# Patient Record
Sex: Male | Born: 1945 | Race: Black or African American | Hispanic: No | Smoking: Former smoker
Health system: Southern US, Community
[De-identification: ages and names within clinical notes are randomized; demographics above are authoritative.]

## PROBLEM LIST (undated history)

## (undated) DIAGNOSIS — I10 Essential (primary) hypertension: Secondary | ICD-10-CM

## (undated) DIAGNOSIS — Z5189 Encounter for other specified aftercare: Secondary | ICD-10-CM

## (undated) DIAGNOSIS — T6701XA Heatstroke and sunstroke, initial encounter: Secondary | ICD-10-CM

## (undated) DIAGNOSIS — N189 Chronic kidney disease, unspecified: Secondary | ICD-10-CM

## (undated) DIAGNOSIS — J439 Emphysema, unspecified: Secondary | ICD-10-CM

## (undated) DIAGNOSIS — M1990 Unspecified osteoarthritis, unspecified site: Secondary | ICD-10-CM

## (undated) DIAGNOSIS — C3491 Malignant neoplasm of unspecified part of right bronchus or lung: Secondary | ICD-10-CM

## (undated) DIAGNOSIS — E119 Type 2 diabetes mellitus without complications: Secondary | ICD-10-CM

## (undated) DIAGNOSIS — F4310 Post-traumatic stress disorder, unspecified: Secondary | ICD-10-CM

## (undated) DIAGNOSIS — F32A Depression, unspecified: Secondary | ICD-10-CM

## (undated) DIAGNOSIS — F329 Major depressive disorder, single episode, unspecified: Secondary | ICD-10-CM

## (undated) DIAGNOSIS — E785 Hyperlipidemia, unspecified: Secondary | ICD-10-CM

## (undated) DIAGNOSIS — K219 Gastro-esophageal reflux disease without esophagitis: Secondary | ICD-10-CM

## (undated) DIAGNOSIS — R519 Headache, unspecified: Secondary | ICD-10-CM

## (undated) DIAGNOSIS — M109 Gout, unspecified: Secondary | ICD-10-CM

## (undated) DIAGNOSIS — R51 Headache: Secondary | ICD-10-CM

## (undated) HISTORY — DX: Emphysema, unspecified: J43.9

## (undated) HISTORY — DX: Encounter for other specified aftercare: Z51.89

## (undated) HISTORY — DX: Major depressive disorder, single episode, unspecified: F32.9

## (undated) HISTORY — PX: OTHER SURGICAL HISTORY: SHX169

## (undated) HISTORY — DX: Gastro-esophageal reflux disease without esophagitis: K21.9

## (undated) HISTORY — DX: Gout, unspecified: M10.9

## (undated) HISTORY — DX: Malignant neoplasm of unspecified part of right bronchus or lung: C34.91

## (undated) HISTORY — DX: Depression, unspecified: F32.A

## (undated) HISTORY — DX: Type 2 diabetes mellitus without complications: E11.9

## (undated) HISTORY — DX: Unspecified osteoarthritis, unspecified site: M19.90

## (undated) HISTORY — DX: Post-traumatic stress disorder, unspecified: F43.10

## (undated) HISTORY — DX: Hyperlipidemia, unspecified: E78.5

## (undated) HISTORY — PX: CATARACT EXTRACTION: SUR2

## (undated) HISTORY — DX: Essential (primary) hypertension: I10

---

## 1964-08-21 DIAGNOSIS — T6701XA Heatstroke and sunstroke, initial encounter: Secondary | ICD-10-CM

## 1964-08-21 HISTORY — DX: Heatstroke and sunstroke, initial encounter: T67.01XA

## 1987-08-22 HISTORY — PX: OTHER SURGICAL HISTORY: SHX169

## 2009-03-28 LAB — HM COLONOSCOPY: HM COLON: NORMAL

## 2009-05-24 LAB — HM COLONOSCOPY

## 2012-06-07 LAB — HM DIABETES EYE EXAM

## 2012-07-31 DIAGNOSIS — Q828 Other specified congenital malformations of skin: Secondary | ICD-10-CM | POA: Diagnosis not present

## 2012-07-31 DIAGNOSIS — M2040 Other hammer toe(s) (acquired), unspecified foot: Secondary | ICD-10-CM | POA: Diagnosis not present

## 2013-02-13 DIAGNOSIS — H26499 Other secondary cataract, unspecified eye: Secondary | ICD-10-CM | POA: Diagnosis not present

## 2013-02-13 DIAGNOSIS — H11159 Pinguecula, unspecified eye: Secondary | ICD-10-CM | POA: Diagnosis not present

## 2013-02-13 DIAGNOSIS — H2510 Age-related nuclear cataract, unspecified eye: Secondary | ICD-10-CM | POA: Diagnosis not present

## 2013-02-13 DIAGNOSIS — H00029 Hordeolum internum unspecified eye, unspecified eyelid: Secondary | ICD-10-CM | POA: Diagnosis not present

## 2013-02-25 DIAGNOSIS — H26499 Other secondary cataract, unspecified eye: Secondary | ICD-10-CM | POA: Diagnosis not present

## 2013-02-25 DIAGNOSIS — H2510 Age-related nuclear cataract, unspecified eye: Secondary | ICD-10-CM | POA: Diagnosis not present

## 2013-02-25 DIAGNOSIS — H11159 Pinguecula, unspecified eye: Secondary | ICD-10-CM | POA: Diagnosis not present

## 2013-02-25 DIAGNOSIS — H00029 Hordeolum internum unspecified eye, unspecified eyelid: Secondary | ICD-10-CM | POA: Diagnosis not present

## 2013-03-11 DIAGNOSIS — H26499 Other secondary cataract, unspecified eye: Secondary | ICD-10-CM | POA: Diagnosis not present

## 2013-03-11 DIAGNOSIS — H00029 Hordeolum internum unspecified eye, unspecified eyelid: Secondary | ICD-10-CM | POA: Diagnosis not present

## 2013-03-11 DIAGNOSIS — H2510 Age-related nuclear cataract, unspecified eye: Secondary | ICD-10-CM | POA: Diagnosis not present

## 2013-07-16 DIAGNOSIS — H2510 Age-related nuclear cataract, unspecified eye: Secondary | ICD-10-CM | POA: Diagnosis not present

## 2013-08-20 ENCOUNTER — Encounter: Payer: Self-pay | Admitting: Podiatrist

## 2013-08-20 ENCOUNTER — Ambulatory Visit (INDEPENDENT_AMBULATORY_CARE_PROVIDER_SITE_OTHER): Payer: Medicare Other | Admitting: Podiatrist

## 2013-08-20 VITALS — BP 157/89 | HR 73 | Resp 16

## 2013-08-20 DIAGNOSIS — L84 Corns and callosities: Secondary | ICD-10-CM | POA: Diagnosis not present

## 2013-08-20 NOTE — Patient Instructions (Signed)
Diabetes and Foot Care Diabetes may cause you to have problems because of poor blood supply (circulation) to your feet and legs. This may cause the skin on your feet to become thinner, break easier, and heal more slowly. Your skin may become dry, and the skin may peel and crack. You may also have nerve damage in your legs and feet causing decreased feeling in them. You may not notice minor injuries to your feet that could lead to infections or more serious problems. Taking care of your feet is one of the most important things you can do for yourself.  HOME CARE INSTRUCTIONS  Wear shoes at all times, even in the house. Do not go barefoot. Bare feet are easily injured.  Check your feet daily for blisters, cuts, and redness. If you cannot see the bottom of your feet, use a mirror or ask someone for help.  Wash your feet with warm water (do not use hot water) and mild soap. Then pat your feet and the areas between your toes until they are completely dry. Do not soak your feet as this can dry your skin.  Apply a moisturizing lotion or petroleum jelly (that does not contain alcohol and is unscented) to the skin on your feet and to dry, brittle toenails. Do not apply lotion between your toes.  Trim your toenails straight across. Do not dig under them or around the cuticle. File the edges of your nails with an emery board or nail file.  Do not cut corns or calluses or try to remove them with medicine.  Wear clean socks or stockings every day. Make sure they are not too tight. Do not wear knee-high stockings since they may decrease blood flow to your legs.  Wear shoes that fit properly and have enough cushioning. To break in new shoes, wear them for just a few hours a day. This prevents you from injuring your feet. Always look in your shoes before you put them on to be sure there are no objects inside.  Do not cross your legs. This may decrease the blood flow to your feet.  If you find a minor scrape,  cut, or break in the skin on your feet, keep it and the skin around it clean and dry. These areas may be cleansed with mild soap and water. Do not cleanse the area with peroxide, alcohol, or iodine.  When you remove an adhesive bandage, be sure not to damage the skin around it.  If you have a wound, look at it several times a day to make sure it is healing.  Do not use heating pads or hot water bottles. They may burn your skin. If you have lost feeling in your feet or legs, you may not know it is happening until it is too late.  Make sure your health care provider performs a complete foot exam at least annually or more often if you have foot problems. Report any cuts, sores, or bruises to your health care provider immediately. SEEK MEDICAL CARE IF:   You have an injury that is not healing.  You have cuts or breaks in the skin.  You have an ingrown nail.  You notice redness on your legs or feet.  You feel burning or tingling in your legs or feet.  You have pain or cramps in your legs and feet.  Your legs or feet are numb.  Your feet always feel cold. SEEK IMMEDIATE MEDICAL CARE IF:   There is increasing redness,   swelling, or pain in or around a wound.  There is a red line that goes up your leg.  Pus is coming from a wound.  You develop a fever or as directed by your health care provider.  You notice a bad smell coming from an ulcer or wound. Document Released: 08/04/2000 Document Revised: 04/09/2013 Document Reviewed: 01/14/2013 Exit[employer] Patient Information 2014 [employer].  

## 2013-08-20 NOTE — Progress Notes (Signed)
   Subjective: Patient presents today for continued care of a corn on the distal tip of the left second toe. He states he wore some nice dressy shoes to a funeral and developed the lesion. He was last seen by Dr. Joylene Igo and she debrided the lesion which resolved his issue. He would like a similar procedure performed at today's visit.  Objective: Chart is reviewed and neurovascular status is unchanged. Elongated contracture of left second toe is noted. Distal tip of the left second toe has a well nucleated porokeratotic lesion. No redness, no swelling, no sign of infection present.  Assessment: Callus, porokeratotic lesion distal tip left second toe  Plan: Excise the soft tissue lesion with a #15 blade without complication. Recommended padding and dispensed a silicone toe condom for his use in the future. He'll be seen back as needed for followup.

## 2013-08-28 ENCOUNTER — Ambulatory Visit: Payer: Self-pay | Admitting: Podiatry

## 2013-12-08 DIAGNOSIS — H2589 Other age-related cataract: Secondary | ICD-10-CM | POA: Diagnosis not present

## 2013-12-22 DIAGNOSIS — H2510 Age-related nuclear cataract, unspecified eye: Secondary | ICD-10-CM | POA: Diagnosis not present

## 2013-12-22 DIAGNOSIS — H2589 Other age-related cataract: Secondary | ICD-10-CM | POA: Diagnosis not present

## 2013-12-22 DIAGNOSIS — H269 Unspecified cataract: Secondary | ICD-10-CM | POA: Diagnosis not present

## 2014-01-26 DIAGNOSIS — H20019 Primary iridocyclitis, unspecified eye: Secondary | ICD-10-CM | POA: Diagnosis not present

## 2014-01-26 DIAGNOSIS — H26499 Other secondary cataract, unspecified eye: Secondary | ICD-10-CM | POA: Diagnosis not present

## 2014-01-26 DIAGNOSIS — H43399 Other vitreous opacities, unspecified eye: Secondary | ICD-10-CM | POA: Diagnosis not present

## 2014-03-26 ENCOUNTER — Telehealth: Payer: Self-pay

## 2014-03-26 ENCOUNTER — Ambulatory Visit (INDEPENDENT_AMBULATORY_CARE_PROVIDER_SITE_OTHER): Payer: Medicare Other | Admitting: Family Medicine

## 2014-03-26 ENCOUNTER — Encounter: Payer: Self-pay | Admitting: Family Medicine

## 2014-03-26 ENCOUNTER — Ambulatory Visit: Payer: Medicare Other

## 2014-03-26 VITALS — BP 158/88 | HR 80 | Temp 98.0°F | Wt 172.0 lb

## 2014-03-26 DIAGNOSIS — F3289 Other specified depressive episodes: Secondary | ICD-10-CM

## 2014-03-26 DIAGNOSIS — F17200 Nicotine dependence, unspecified, uncomplicated: Secondary | ICD-10-CM

## 2014-03-26 DIAGNOSIS — M1A09X0 Idiopathic chronic gout, multiple sites, without tophus (tophi): Secondary | ICD-10-CM

## 2014-03-26 DIAGNOSIS — Z720 Tobacco use: Secondary | ICD-10-CM

## 2014-03-26 DIAGNOSIS — M1A00X0 Idiopathic chronic gout, unspecified site, without tophus (tophi): Secondary | ICD-10-CM | POA: Diagnosis not present

## 2014-03-26 DIAGNOSIS — E1159 Type 2 diabetes mellitus with other circulatory complications: Secondary | ICD-10-CM | POA: Insufficient documentation

## 2014-03-26 DIAGNOSIS — E785 Hyperlipidemia, unspecified: Secondary | ICD-10-CM | POA: Insufficient documentation

## 2014-03-26 DIAGNOSIS — K219 Gastro-esophageal reflux disease without esophagitis: Secondary | ICD-10-CM | POA: Insufficient documentation

## 2014-03-26 DIAGNOSIS — F329 Major depressive disorder, single episode, unspecified: Secondary | ICD-10-CM

## 2014-03-26 DIAGNOSIS — Z87891 Personal history of nicotine dependence: Secondary | ICD-10-CM | POA: Insufficient documentation

## 2014-03-26 DIAGNOSIS — I10 Essential (primary) hypertension: Secondary | ICD-10-CM | POA: Diagnosis not present

## 2014-03-26 DIAGNOSIS — E119 Type 2 diabetes mellitus without complications: Secondary | ICD-10-CM

## 2014-03-26 DIAGNOSIS — Z860101 Personal history of adenomatous and serrated colon polyps: Secondary | ICD-10-CM | POA: Insufficient documentation

## 2014-03-26 DIAGNOSIS — E1129 Type 2 diabetes mellitus with other diabetic kidney complication: Secondary | ICD-10-CM | POA: Insufficient documentation

## 2014-03-26 DIAGNOSIS — Z86010 Personal history of colonic polyps: Secondary | ICD-10-CM

## 2014-03-26 DIAGNOSIS — M1A9XX0 Chronic gout, unspecified, without tophus (tophi): Secondary | ICD-10-CM | POA: Diagnosis not present

## 2014-03-26 DIAGNOSIS — M1990 Unspecified osteoarthritis, unspecified site: Secondary | ICD-10-CM

## 2014-03-26 DIAGNOSIS — M129 Arthropathy, unspecified: Secondary | ICD-10-CM

## 2014-03-26 DIAGNOSIS — I152 Hypertension secondary to endocrine disorders: Secondary | ICD-10-CM | POA: Insufficient documentation

## 2014-03-26 DIAGNOSIS — F32A Depression, unspecified: Secondary | ICD-10-CM | POA: Insufficient documentation

## 2014-03-26 DIAGNOSIS — M109 Gout, unspecified: Secondary | ICD-10-CM | POA: Insufficient documentation

## 2014-03-26 LAB — CBC
HCT: 46.6 % (ref 39.0–52.0)
Hemoglobin: 15.4 g/dL (ref 13.0–17.0)
MCHC: 33.1 g/dL (ref 30.0–36.0)
MCV: 90.9 fl (ref 78.0–100.0)
Platelets: 204 10*3/uL (ref 150.0–400.0)
RBC: 5.13 Mil/uL (ref 4.22–5.81)
RDW: 13.4 % (ref 11.5–15.5)
WBC: 6.4 10*3/uL (ref 4.0–10.5)

## 2014-03-26 LAB — LIPID PANEL
CHOL/HDL RATIO: 6
Cholesterol: 184 mg/dL (ref 0–200)
HDL: 30.8 mg/dL — ABNORMAL LOW (ref >39.00)
LDL CALC: 143 mg/dL — AB (ref 0–99)
NONHDL: 153.2
Triglycerides: 49 mg/dL (ref 0.0–149.0)
VLDL: 9.8 mg/dL (ref 0.0–40.0)

## 2014-03-26 LAB — COMPREHENSIVE METABOLIC PANEL
ALBUMIN: 4 g/dL (ref 3.5–5.2)
ALK PHOS: 69 U/L (ref 39–117)
ALT: 18 U/L (ref 0–53)
AST: 18 U/L (ref 0–37)
BUN: 12 mg/dL (ref 6–23)
CO2: 28 mEq/L (ref 19–32)
Calcium: 8.9 mg/dL (ref 8.4–10.5)
Chloride: 103 mEq/L (ref 96–112)
Creatinine, Ser: 1.2 mg/dL (ref 0.4–1.5)
GFR: 74.11 mL/min (ref >60.00)
Glucose, Bld: 81 mg/dL (ref 70–99)
POTASSIUM: 3.7 meq/L (ref 3.5–5.1)
SODIUM: 138 meq/L (ref 135–145)
TOTAL PROTEIN: 7.1 g/dL (ref 6.0–8.3)
Total Bilirubin: 0.7 mg/dL (ref 0.2–1.2)

## 2014-03-26 LAB — URIC ACID: Uric Acid, Serum: 5.6 mg/dL (ref 4.0–7.8)

## 2014-03-26 LAB — HEMOGLOBIN A1C: Hgb A1c MFr Bld: 5.9 % (ref 4.6–6.5)

## 2014-03-26 MED ORDER — LISINOPRIL 40 MG PO TABS: 40.0000 mg | ORAL_TABLET | Freq: Every day | ORAL | Status: DC

## 2014-03-26 MED ORDER — ATORVASTATIN CALCIUM 80 MG PO TABS: 40.0000 mg | ORAL_TABLET | Freq: Every day | ORAL | Status: DC

## 2014-03-26 MED ORDER — OMEPRAZOLE 20 MG PO CPDR: 20.0000 mg | DELAYED_RELEASE_CAPSULE | Freq: Every day | ORAL | Status: DC

## 2014-03-26 MED ORDER — ALLOPURINOL 100 MG PO TABS: 100.0000 mg | ORAL_TABLET | Freq: Every day | ORAL | Status: DC

## 2014-03-26 NOTE — Assessment & Plan Note (Signed)
Poorly controlled today as did not take lisinopril. Advised patient to take medication daily. He will return in 2 weeks on day he has taken it. I will refill medication if Cr ok today.

## 2014-03-26 NOTE — Patient Instructions (Signed)
Labs today. Call or message with results.  If labs ok, will refill medications See me back in 2 weeks to recheck blood pressure on day you are taking the medicine.

## 2014-03-26 NOTE — Assessment & Plan Note (Signed)
Prn indomethacin. Does not need new rx today. Check uric acid.
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
## 2014-03-26 NOTE — Assessment & Plan Note (Signed)
Counseled on cessation, not ready to quit.

## 2014-03-26 NOTE — Addendum Note (Signed)
Addended by: [name] on: 03/26/2014 06:11 PM   Modules accepted: Orders

## 2014-03-26 NOTE — Assessment & Plan Note (Signed)
Check lipids today. Patient took himself off of atorvastatin due to risk for diabetes. Discussed that he already has diabetes and risks of being off medication far outweigh risks of being on it. He would like to know level of lipids first before starting and admits he may not start one at all.

## 2014-03-26 NOTE — Assessment & Plan Note (Signed)
Check a1c today. Reports being diet controlled.

## 2014-03-26 NOTE — Telephone Encounter (Signed)
error 

## 2014-04-09 ENCOUNTER — Ambulatory Visit (INDEPENDENT_AMBULATORY_CARE_PROVIDER_SITE_OTHER): Payer: Medicare Other | Admitting: Family Medicine

## 2014-04-09 ENCOUNTER — Encounter: Payer: Self-pay | Admitting: Family Medicine

## 2014-04-09 VITALS — BP 140/82 | HR 68 | Temp 98.1°F | Wt 173.0 lb

## 2014-04-09 DIAGNOSIS — N529 Male erectile dysfunction, unspecified: Secondary | ICD-10-CM | POA: Insufficient documentation

## 2014-04-09 DIAGNOSIS — E785 Hyperlipidemia, unspecified: Secondary | ICD-10-CM | POA: Diagnosis not present

## 2014-04-09 DIAGNOSIS — I10 Essential (primary) hypertension: Secondary | ICD-10-CM

## 2014-04-09 DIAGNOSIS — N521 Erectile dysfunction due to diseases classified elsewhere: Secondary | ICD-10-CM

## 2014-04-09 MED ORDER — LOVASTATIN 20 MG PO TABS: 20.0000 mg | ORAL_TABLET | Freq: Every day | ORAL | Status: DC

## 2014-04-09 NOTE — Assessment & Plan Note (Signed)
Patient willing to trial lovastatin one to 2 times a week. Advised regular use. Discussed benefits and risks.

## 2014-04-09 NOTE — Patient Instructions (Signed)
Blood Pressure  Looks good today. Continue medication daily.   Erectile Dysfunction  Try cialis samples. Use only 1/2 tab and that will give you 12 doses. Then we can refill if need be.   Cholesterol  Take lovastatin at least once a week, if you can take it twice a week, that would be great as well  Health Maintenance Due  Topic Date Due  . Colonoscopy - try to get records 03/04/1994  . Zostavax - try to get records 03/04/2004  . Influenza Vaccine - when available 03/21/2014

## 2014-04-09 NOTE — Progress Notes (Signed)
  [name], MD Phone: [phone]  Subjective:   Richard Navarro is a 68 y.o. year old very pleasant male patient who presents with the following:  Hypertension BP Readings from Last 3 Encounters:  04/09/14 140/82  03/26/14 158/88  08/20/13 157/89  Home BP monitoring-no Compliant with medications-yes without side effects, lisinopril ROS-Denies any CP, HA, SOB, blurry vision, LE edema.   Erectile Dysfunction Tried viagra and cialis in the past. Currently not using anything.  ROS-unable to obtain erections  Hyperlipidemia On statin: no, resistant but agreeable ROS- no chest pain or shortness of breath. No myalgias  Past Medical History- Patient Active Problem List   Diagnosis Date Noted  . Tobacco abuse 03/26/2014    Priority: High  . Diabetes mellitus without complication     Priority: High  . Erectile dysfunction 04/09/2014    Priority: Medium  . Hypertension     Priority: Medium  . Gout     Priority: Medium  . Depression     Priority: Medium  . Hyperlipidemia     Priority: Medium  . Arthritis 03/26/2014    Priority: Low  . Personal history of colonic polyps 03/26/2014    Priority: Low  . GERD (gastroesophageal reflux disease)     Priority: Low   Medications- reviewed and updated Current Outpatient Prescriptions  Medication Sig Dispense Refill  . allopurinol (ZYLOPRIM) 100 MG tablet Take 1 tablet (100 mg total) by mouth daily.  30 tablet  11  . aspirin 81 MG tablet Take 81 mg by mouth daily.      . indomethacin (INDOCIN) 25 MG capsule Take 25 mg by mouth 2 (two) times daily with a meal.      . lisinopril (PRINIVIL,ZESTRIL) 40 MG tablet Take 1 tablet (40 mg total) by mouth daily. Take 1/2 tablet per day  30 tablet  prn  . omeprazole (PRILOSEC) 20 MG capsule Take 1 capsule (20 mg total) by mouth daily.  30 capsule  prn  . lovastatin (MEVACOR) 20 MG tablet Take 1 tablet (20 mg total) by mouth at bedtime. At least once a week.  30 tablet  5   No current  facility-administered medications for this visit.    Objective: BP 140/82  Pulse 68  Temp(Src) 98.1 F (36.7 C)  Wt 173 lb (78.472 kg) Gen: NAD, resting comfortably CV: RRR no murmurs rubs or gallops Lungs: CTAB no crackles, wheeze, rhonchi Ext: no edema  Assessment/Plan:  Hypertension Well-controlled when actually taking medication. Encourage regular use of lisinopril.  Erectile dysfunction Trial Cialis through samples. Will give regular prescription if patient request  Hyperlipidemia Patient willing to trial lovastatin one to 2 times a week. Advised regular use. Discussed benefits and risks.   Encourage smoking cessation. the patient is not ready to quit  Meds ordered this encounter  Medications  . lovastatin (MEVACOR) 20 MG tablet    Sig: Take 1 tablet (20 mg total) by mouth at bedtime. At least once a week.    Dispense:  30 tablet    Refill:  5

## 2014-04-09 NOTE — Assessment & Plan Note (Signed)
Trial Cialis through samples. Will give regular prescription if patient request

## 2014-04-09 NOTE — Assessment & Plan Note (Signed)
Well-controlled when actually taking medication. Encourage regular use of lisinopril.

## 2014-04-13 ENCOUNTER — Telehealth: Payer: Self-pay

## 2014-04-13 NOTE — Telephone Encounter (Signed)
LM for pt to stop by and fill out record release for so we can get colonoscopy results.

## 2014-04-17 ENCOUNTER — Encounter: Payer: Self-pay | Admitting: Family Medicine

## 2014-04-17 DIAGNOSIS — Z125 Encounter for screening for malignant neoplasm of prostate: Secondary | ICD-10-CM | POA: Insufficient documentation

## 2014-04-17 DIAGNOSIS — N182 Chronic kidney disease, stage 2 (mild): Secondary | ICD-10-CM | POA: Insufficient documentation

## 2014-04-28 ENCOUNTER — Telehealth: Payer: Self-pay | Admitting: Family Medicine

## 2014-04-28 MED ORDER — OMEPRAZOLE 20 MG PO CPDR: 20.0000 mg | DELAYED_RELEASE_CAPSULE | Freq: Every day | ORAL | Status: DC

## 2014-04-28 MED ORDER — ALLOPURINOL 100 MG PO TABS: 100.0000 mg | ORAL_TABLET | Freq: Every day | ORAL | Status: DC

## 2014-04-28 MED ORDER — LOVASTATIN 20 MG PO TABS: 20.0000 mg | ORAL_TABLET | Freq: Every day | ORAL | Status: DC

## 2014-04-28 MED ORDER — LISINOPRIL 40 MG PO TABS: 40.0000 mg | ORAL_TABLET | Freq: Every day | ORAL | Status: DC

## 2014-04-28 NOTE — Telephone Encounter (Signed)
Pt needs written rxs for omeprazole 20 mg,linsinopril 40  Mg,allopurinol 100 mg and lovastatin 20 mg #90 each with refills. Pt wife will pick up rxs

## 2014-04-28 NOTE — Telephone Encounter (Signed)
Medications printed and ready for pt

## 2014-05-22 ENCOUNTER — Telehealth: Payer: Self-pay | Admitting: Family Medicine

## 2014-05-22 MED ORDER — LOVASTATIN 20 MG PO TABS: 20.0000 mg | ORAL_TABLET | Freq: Every day | ORAL | Status: DC

## 2014-05-22 MED ORDER — LISINOPRIL 40 MG PO TABS: 40.0000 mg | ORAL_TABLET | Freq: Every day | ORAL | Status: DC

## 2014-05-22 NOTE — Telephone Encounter (Signed)
Left detailed message Rx's sent to [facility] mail order pharmacy.

## 2014-05-22 NOTE — Telephone Encounter (Signed)
Pt request refill of  90 day lisinopril (PRINIVIL,ZESTRIL) 40 MG tablet lovastatin (MEVACOR) 20 MG tablet  Pt request this be sent to [facility] Pt would [facility] all [facility]

## 2014-06-19 ENCOUNTER — Ambulatory Visit (INDEPENDENT_AMBULATORY_CARE_PROVIDER_SITE_OTHER): Payer: Medicare Other

## 2014-06-19 DIAGNOSIS — Z23 Encounter for immunization: Secondary | ICD-10-CM

## 2014-06-25 ENCOUNTER — Telehealth: Payer: Self-pay

## 2014-06-25 MED ORDER — TADALAFIL 10 MG PO TABS: 10.0000 mg | ORAL_TABLET | Freq: Every day | ORAL | Status: DC | PRN

## 2014-06-25 NOTE — Telephone Encounter (Signed)
Filled 10mg  #10 please inform patien5t

## 2014-06-25 NOTE — Telephone Encounter (Signed)
Pt is requesting a prescription for Cialis.  Pls send to [place].

## 2014-06-26 NOTE — Telephone Encounter (Signed)
Pt.notified

## 2014-06-30 ENCOUNTER — Other Ambulatory Visit: Payer: Self-pay

## 2014-06-30 MED ORDER — LOVASTATIN 20 MG PO TABS: 20.0000 mg | ORAL_TABLET | Freq: Every day | ORAL | Status: DC

## 2014-06-30 MED ORDER — LISINOPRIL 40 MG PO TABS: 40.0000 mg | ORAL_TABLET | Freq: Every day | ORAL | Status: DC

## 2014-09-09 ENCOUNTER — Other Ambulatory Visit: Payer: Self-pay | Admitting: Family Medicine

## 2014-09-29 ENCOUNTER — Other Ambulatory Visit: Payer: Self-pay | Admitting: Family Medicine

## 2014-10-15 ENCOUNTER — Encounter: Payer: Self-pay | Admitting: Family Medicine

## 2014-10-15 ENCOUNTER — Ambulatory Visit (INDEPENDENT_AMBULATORY_CARE_PROVIDER_SITE_OTHER): Payer: Medicare Other | Admitting: Family Medicine

## 2014-10-15 VITALS — BP 154/84 | Temp 98.2°F | Wt 175.0 lb

## 2014-10-15 DIAGNOSIS — N182 Chronic kidney disease, stage 2 (mild): Secondary | ICD-10-CM

## 2014-10-15 DIAGNOSIS — E119 Type 2 diabetes mellitus without complications: Secondary | ICD-10-CM

## 2014-10-15 DIAGNOSIS — I10 Essential (primary) hypertension: Secondary | ICD-10-CM

## 2014-10-15 DIAGNOSIS — Z720 Tobacco use: Secondary | ICD-10-CM | POA: Diagnosis not present

## 2014-10-15 NOTE — Patient Instructions (Addendum)
Hold off on repeat a1c test since you have done so well Blood pressure up just a little bit, make sure to take medicine daily even when not feeling well We discussed slight kidney damage so really want to control blood pressure  Follow up 6 months

## 2014-10-15 NOTE — Progress Notes (Signed)
  [name], MD Phone: [phone]  Subjective:   Richard Navarro is a 69 y.o. year old very pleasant male patient who presents with the following:  Hypertension-poor control due to noncompliance  BP Readings from Last 3 Encounters:  10/15/14 154/84  04/09/14 140/82  03/26/14 158/88  Home BP monitoring-no Compliant with medications-intermittently ROS-Denies any CP, HA, SOB, blurry vision, LE edema  CKD Stage II Stable on lisinopril 40mg  (though there is some noncompliance) ROS- no changes to urination  Diabetes Mellitus Diet controlled with last a1c 5.9.  ROS-no blurry vision or hypoglycemia  Smoking-now over pack a day. Has quit 6 years in past.  Ros- no shortness of breath Health Maintenance Due  Topic Date Due  . FOOT EXAM  06/04/2014  . HEMOGLOBIN A1C  09/26/2014    Lab Results  Component Value Date   HGBA1C 5.9 03/26/2014        - ROS-  Past Medical History- Patient Active Problem List   Diagnosis Date Noted  . 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 Tobacco abuse 03/26/2014    Priority: High  . Diabetes mellitus without complication     Priority: High  . Erectile dysfunction 04/09/2014    Priority: Medium  . Hypertension     Priority: Medium  . Gout     Priority: Medium  . Depression     Priority: Medium  . Hyperlipidemia     Priority: Medium  . Arthritis 03/26/2014    Priority: Low  . Personal history of colonic polyps 03/26/2014    Priority: Low  . GERD (gastroesophageal reflux disease)     Priority: Low  . Prostate cancer screening 04/17/2014   Medications- reviewed and updated Current Outpatient Prescriptions  Medication Sig Dispense Refill  . allopurinol (ZYLOPRIM) 100 MG tablet TAKE 1 TABLET DAILY 90 tablet 1  . aspirin 81 MG tablet Take 81 mg by mouth daily.    . indomethacin (INDOCIN) 25 MG capsule Take 25 mg by mouth 2 (two) times daily with a meal.    . lisinopril (PRINIVIL,ZESTRIL) 40  MG tablet TAKE 1 TABLET DAILY 90 tablet 1  . lovastatin (MEVACOR) 20 MG tablet Take 1 tablet (20 mg total) by mouth at bedtime. At least twice a week. 90 tablet 0  . omeprazole (PRILOSEC) 20 MG capsule TAKE 1 CAPSULE DAILY 90 capsule 1  . tadalafil (CIALIS) 10 MG tablet Take 1 tablet (10 mg total) by mouth daily as needed for erectile dysfunction. (Patient not taking: Reported on 10/15/2014) 10 tablet 0   Objective: BP 154/84 mmHg  Temp(Src) 98.2 F (36.8 C)  Wt 175 lb (79.379 kg) Gen: NAD, resting comfortably on table, occasional cough Turbinates erythematous, some drainage in pharynx but otherwise normal CV: RRR no murmurs rubs or gallops Lungs: CTAB no crackles, wheeze, rhonchi Abdomen: soft/nontender/nondistended/normal bowel sounds.  Ext: no edema Skin: warm, dry   Assessment/Plan:  CKD (chronic kidney disease), stage II Stable. Continue lisinopril 40mg -stressed compliance   Hypertension Poor control as not compliant at this time. Stressed need for compliance. Much better control at last visit when taking medication.    Diabetes mellitus without complication Diet controlled. Due to excellent control-plan on q12 months a1c. Foot exam next visit.    Tobacco abuse Encouraged cessation. Patient not ready to quit.    6 month follow up

## 2014-10-15 NOTE — Assessment & Plan Note (Signed)
Diet controlled. Due to excellent control-plan on q12 months a1c. Foot exam next visit.

## 2014-10-15 NOTE — Assessment & Plan Note (Signed)
Poor control as not compliant at this time. Stressed need for compliance. Much better control at last visit when taking medication.

## 2014-10-15 NOTE — Assessment & Plan Note (Signed)
Stable. Continue lisinopril 40mg -stressed compliance

## 2014-10-15 NOTE — Assessment & Plan Note (Signed)
Encouraged cessation. Patient not ready to quit.

## 2014-10-27 ENCOUNTER — Emergency Department (HOSPITAL_COMMUNITY)
Admission: EM | Admit: 2014-10-27 | Discharge: 2014-10-28 | Disposition: A | Discharge status: Home / Self Care | Payer: Medicare Other | Attending: Emergency Medicine | Admitting: Emergency Medicine

## 2014-10-27 ENCOUNTER — Emergency Department (HOSPITAL_COMMUNITY): Payer: Medicare Other

## 2014-10-27 ENCOUNTER — Encounter (HOSPITAL_COMMUNITY): Payer: Self-pay | Admitting: Emergency Medicine

## 2014-10-27 DIAGNOSIS — E785 Hyperlipidemia, unspecified: Secondary | ICD-10-CM | POA: Insufficient documentation

## 2014-10-27 DIAGNOSIS — I10 Essential (primary) hypertension: Secondary | ICD-10-CM

## 2014-10-27 DIAGNOSIS — E119 Type 2 diabetes mellitus without complications: Secondary | ICD-10-CM | POA: Insufficient documentation

## 2014-10-27 DIAGNOSIS — K219 Gastro-esophageal reflux disease without esophagitis: Secondary | ICD-10-CM | POA: Diagnosis not present

## 2014-10-27 DIAGNOSIS — R5383 Other fatigue: Secondary | ICD-10-CM

## 2014-10-27 DIAGNOSIS — Z7982 Long term (current) use of aspirin: Secondary | ICD-10-CM | POA: Insufficient documentation

## 2014-10-27 DIAGNOSIS — R51 Headache: Secondary | ICD-10-CM | POA: Insufficient documentation

## 2014-10-27 DIAGNOSIS — F17200 Nicotine dependence, unspecified, uncomplicated: Secondary | ICD-10-CM | POA: Diagnosis not present

## 2014-10-27 DIAGNOSIS — M109 Gout, unspecified: Secondary | ICD-10-CM | POA: Insufficient documentation

## 2014-10-27 DIAGNOSIS — R110 Nausea: Secondary | ICD-10-CM | POA: Insufficient documentation

## 2014-10-27 DIAGNOSIS — J984 Other disorders of lung: Secondary | ICD-10-CM | POA: Diagnosis not present

## 2014-10-27 DIAGNOSIS — Z79899 Other long term (current) drug therapy: Secondary | ICD-10-CM | POA: Insufficient documentation

## 2014-10-27 LAB — COMPREHENSIVE METABOLIC PANEL
ALT: 23 U/L (ref 0–53)
ANION GAP: 6 (ref 5–15)
AST: 24 U/L (ref 0–37)
Albumin: 3.6 g/dL (ref 3.5–5.2)
Alkaline Phosphatase: 81 U/L (ref 39–117)
BILIRUBIN TOTAL: 0.7 mg/dL (ref 0.3–1.2)
BUN: 15 mg/dL (ref 6–23)
CO2: 24 mmol/L (ref 19–32)
CREATININE: 1.41 mg/dL — AB (ref 0.50–1.35)
Calcium: 8.3 mg/dL — ABNORMAL LOW (ref 8.4–10.5)
Chloride: 107 mmol/L (ref 96–112)
GFR calc Af Amer: 57 mL/min — ABNORMAL LOW (ref >90)
GFR calc non Af Amer: 49 mL/min — ABNORMAL LOW (ref >90)
GLUCOSE: 92 mg/dL (ref 70–99)
Potassium: 3.6 mmol/L (ref 3.5–5.1)
Sodium: 137 mmol/L (ref 135–145)
Total Protein: 6.5 g/dL (ref 6.0–8.3)

## 2014-10-27 LAB — CBC WITH DIFFERENTIAL/PLATELET
Basophils Absolute: 0 10*3/uL (ref 0.0–0.1)
Basophils Relative: 0 % (ref 0–1)
EOS ABS: 0 10*3/uL (ref 0.0–0.7)
EOS PCT: 0 % (ref 0–5)
HCT: 44 % (ref 39.0–52.0)
HEMOGLOBIN: 15.9 g/dL (ref 13.0–17.0)
LYMPHS ABS: 1.2 10*3/uL (ref 0.7–4.0)
Lymphocytes Relative: 33 % (ref 12–46)
MCH: 31.2 pg (ref 26.0–34.0)
MCHC: 36.1 g/dL — ABNORMAL HIGH (ref 30.0–36.0)
MCV: 86.3 fL (ref 78.0–100.0)
MONOS PCT: 13 % — AB (ref 3–12)
Monocytes Absolute: 0.5 10*3/uL (ref 0.1–1.0)
Neutro Abs: 2 10*3/uL (ref 1.7–7.7)
Neutrophils Relative %: 54 % (ref 43–77)
Platelets: 190 10*3/uL (ref 150–400)
RBC: 5.1 MIL/uL (ref 4.22–5.81)
RDW: 13.3 % (ref 11.5–15.5)
WBC: 3.7 10*3/uL — ABNORMAL LOW (ref 4.0–10.5)

## 2014-10-27 MED ORDER — LISINOPRIL 20 MG PO TABS
40.0000 mg | ORAL_TABLET | Freq: Once | ORAL | Status: AC
  Administered 2014-10-27: 40 mg via ORAL
  Filled 2014-10-27: qty 2
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
## 2014-10-27 NOTE — ED Notes (Signed)
Pt presents with nausea and body aches that started yesterday- pt has noted BP to be elevated throughout the day.  Pt also admits to fatigue and headaches.  Neuro exam normal at present, pt denies pain at present.

## 2014-10-28 ENCOUNTER — Telehealth: Payer: Self-pay | Admitting: Family Medicine

## 2014-10-28 LAB — TROPONIN I: Troponin I: <0.03 ng/mL (ref <0.031)

## 2014-10-28 NOTE — Discharge Instructions (Signed)
Make sure to take your blood pressure medication every day to keep it well controlled. Make sure to get plenty of sleep at night to prevent excessive fatigue.  Eat regular balanced meals as well. Follow-up with your primary care physician. Return to the ED for new concerns.

## 2014-10-28 NOTE — Telephone Encounter (Signed)
See below

## 2014-10-28 NOTE — Telephone Encounter (Signed)
Friday afternoon would be ok. Please reinfore to him to take lisinopril that morning.

## 2014-10-28 NOTE — Telephone Encounter (Signed)
Pt went to ed last night w/ high bp. Pt advised to fu and to check his bp meds. pls advise on when to schedule.

## 2014-10-28 NOTE — Telephone Encounter (Signed)
Pt has been scheduled. Pt states he will take his lisinopril. Pt also wants you to know he has been on this med since about the year 2000. Same drug, amount dosage.

## 2014-10-30 ENCOUNTER — Ambulatory Visit (INDEPENDENT_AMBULATORY_CARE_PROVIDER_SITE_OTHER): Payer: Medicare Other | Admitting: Family Medicine

## 2014-10-30 ENCOUNTER — Encounter: Payer: Self-pay | Admitting: Family Medicine

## 2014-10-30 VITALS — BP 130/78 | HR 75 | Temp 97.7°F | Wt 173.0 lb

## 2014-10-30 DIAGNOSIS — I10 Essential (primary) hypertension: Secondary | ICD-10-CM | POA: Diagnosis not present

## 2014-10-30 DIAGNOSIS — Z23 Encounter for immunization: Secondary | ICD-10-CM | POA: Diagnosis not present

## 2014-10-30 DIAGNOSIS — E785 Hyperlipidemia, unspecified: Secondary | ICD-10-CM | POA: Diagnosis not present

## 2014-10-30 DIAGNOSIS — E119 Type 2 diabetes mellitus without complications: Secondary | ICD-10-CM

## 2014-10-30 LAB — HEMOGLOBIN A1C: Hgb A1c MFr Bld: 6 % (ref 4.6–6.5)

## 2014-10-30 LAB — LDL CHOLESTEROL, DIRECT: Direct LDL: 99 mg/dL

## 2014-10-30 NOTE — Assessment & Plan Note (Signed)
With Lovastatin twice a week- LDL 99. Continue current rx.

## 2014-10-30 NOTE — Patient Instructions (Addendum)
Received final pneumonia shot today ([id number])  Check a1c today, LDL  Blood pressure looks great ON medicine, PLEASE TAKE YOUR MEDICINE!!!!!!!!

## 2014-10-30 NOTE — Assessment & Plan Note (Signed)
Controlled with a1c of 6 today. No rx. Continue to maintain weight.

## 2014-10-30 NOTE — Assessment & Plan Note (Signed)
Appears when patient compliant with 40mg  lisinopril that Bp is actually controlled as it is today. I reinforced to patient need for compliance along with his ex wife who came with him today.

## 2014-10-30 NOTE — Progress Notes (Signed)
[name], MD Phone: [phone]  Subjective:   Richard Navarro is a 69 y.o. year old very pleasant male patient who presents with the following:  ED follow up for hypertension- controlled today Seen on 10/27/14 in ED for intermittent headache, fatigue and nausea. 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 Often misses doses of lisinopril per ED note and per my prior discussionwith patient. He vomited up medicine the day of going to ED which led him to go to that visit.   We had a long discussion today about need for compliance. He states he is old and simply forgets. Will not wear watch and does not believe in cell phones .  BP Readings from Last 3 Encounters:  10/30/14 130/78  10/15/14 154/84  04/09/14 140/82  ROS- no chest pain or shortness of breath  Hyperlipidemia-good control on lovastatin 20mg  twice a week with LDL 99  ROS- no chest pain or shortness of breath. No myalgias  DIABETES Type II-controlled with diet/exercise  Lab Results  Component Value Date   HGBA1C 6.0 10/30/2014   HGBA1C 5.9 03/26/2014  On Aspirin-yes On statin-yes Daily foot monitoring-yes  ROS- Denies Vision changes, feet or hand numbness/pain/tingling. Denies  Hypoglycemia symptoms (shaky, sweaty, hungry, weak anxious, tremor, palpitations, confusion, behavior change).   Past Medical History- Patient Active Problem List   Diagnosis Date Noted  . 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 Tobacco abuse 03/26/2014    Priority: High  . Diabetes mellitus without complication     Priority: High  . Erectile dysfunction 04/09/2014    Priority: Medium  . Hypertension     Priority: Medium  . Gout     Priority: Medium  . Depression     Priority: Medium  . Hyperlipidemia     Priority: Medium  . Arthritis 03/26/2014    Priority: Low  . Personal history of colonic polyps 03/26/2014    Priority: Low  . GERD (gastroesophageal reflux disease)     Priority: Low  .  Prostate cancer screening 04/17/2014   Medications- reviewed and updated Current Outpatient Prescriptions  Medication Sig Dispense Refill  . allopurinol (ZYLOPRIM) 100 MG tablet TAKE 1 TABLET DAILY 90 tablet 1  . aspirin 81 MG tablet Take 81 mg by mouth daily.    . indomethacin (INDOCIN) 25 MG capsule Take 25 mg by mouth 2 (two) times daily with a meal.    . lisinopril (PRINIVIL,ZESTRIL) 40 MG tablet TAKE 1 TABLET DAILY 90 tablet 1  . lovastatin (MEVACOR) 20 MG tablet Take 1 tablet (20 mg total) by mouth at bedtime. At least once a week. 90 tablet 0  . omeprazole (PRILOSEC) 20 MG capsule TAKE 1 CAPSULE DAILY 90 capsule 1  . tadalafil (CIALIS) 10 MG tablet Take 1 tablet (10 mg total) by mouth daily as needed for erectile dysfunction. (Patient not taking: Reported on 10/15/2014) 10 tablet 0   No current facility-administered medications for this visit.    Objective: BP 130/78 mmHg  Pulse 75  Temp(Src) 97.7 F (36.5 C)  Wt 173 lb (78.472 kg) Gen: NAD, resting comfortably on table CV: RRR no murmurs rubs or gallops Lungs: CTAB no crackles, wheeze, rhonchi Abdomen: soft/nontender/nondistended/normal bowel sounds. No rebound or guarding.  Ext: no edema Skin: warm, dry, no rash  Neuro: grossly normal, moves all extremities, normal gait   Assessment/Plan:  Hypertension Appears when patient compliant with 40mg  lisinopril that Bp is actually controlled as it is today.  I reinforced to patient need for compliance along with his ex wife who came with him today.    Diabetes mellitus without complication Controlled with a1c of 6 today. No rx. Continue to maintain weight.    Hyperlipidemia With Lovastatin twice a week- LDL 99. Continue current rx.    6 month follow up. Still needs to quit smoking but not ready  Orders Placed This Encounter  Procedures  . Pneumococcal conjugate vaccine 13-valent   Results for orders placed or performed in visit on 10/30/14 (from the past 24 hour(s))    Hemoglobin A1c     Status: None   Collection Time: 10/30/14  4:09 PM  Result Value Ref Range   Hgb A1c MFr Bld 6.0 4.6 - 6.5 %  LDL cholesterol, direct     Status: None   Collection Time: 10/30/14  4:09 PM  Result Value Ref Range   Direct LDL 99.0 mg/dL

## 2014-12-10 ENCOUNTER — Encounter: Payer: Self-pay | Admitting: Family Medicine

## 2014-12-10 ENCOUNTER — Ambulatory Visit (INDEPENDENT_AMBULATORY_CARE_PROVIDER_SITE_OTHER): Payer: Medicare Other | Admitting: Family Medicine

## 2014-12-10 DIAGNOSIS — I10 Essential (primary) hypertension: Secondary | ICD-10-CM | POA: Diagnosis not present

## 2014-12-10 MED ORDER — LISINOPRIL-HYDROCHLOROTHIAZIDE 20-12.5 MG PO TABS: 2.0000 | ORAL_TABLET | Freq: Every day | ORAL | Status: DC

## 2014-12-10 NOTE — Patient Instructions (Signed)
Start 2 pills of lisinopril-hydrochlorothiazide in the morning. Stop lisinopril alone.   See me within 2 weeks if blood pressure is doing better, [facility]

## 2014-12-10 NOTE — Progress Notes (Signed)
  [name], MD Phone: [phone]  Subjective:   Richard Navarro is a 69 y.o. year old very pleasant male patient who presents with the following:  Hypertension-poor control on lisinopril '40mg'$  BP Readings from Last 3 Encounters:  12/10/14 160/88  10/30/14 130/78  10/15/14 154/84   Home BP monitoring-yes and often in 150s or 160s Compliant with medications-yes without side effects but previously noncompliant ROS-Denies any CP, HA, SOB, blurry vision, LE edema  Past Medical History- Patient Active Problem List   Diagnosis Date Noted  . Tobacco abuse 03/26/2014    Priority: High  . Diabetes mellitus without complication     Priority: High  . Erectile dysfunction 04/09/2014    Priority: Medium  . Hypertension     Priority: Medium  . Gout     Priority: Medium  . Depression     Priority: Medium  . Hyperlipidemia     Priority: Medium  . CKD (chronic kidney disease), stage II 04/17/2014    Priority: Low  . Prostate cancer screening 04/17/2014    Priority: Low  . Arthritis 03/26/2014    Priority: Low  . Personal history of colonic polyps 03/26/2014    Priority: Low  . GERD (gastroesophageal reflux disease)     Priority: Low   Medications- reviewed and updated Current Outpatient Prescriptions  Medication Sig Dispense Refill  . allopurinol (ZYLOPRIM) 100 MG tablet TAKE 1 TABLET DAILY 90 tablet 1  . aspirin 81 MG tablet Take 81 mg by mouth daily.    . indomethacin (INDOCIN) 25 MG capsule Take 25 mg by mouth 2 (two) times daily with a meal.    . lisinopril (PRINIVIL,ZESTRIL) 40 MG tablet TAKE 1 TABLET DAILY 90 tablet 1  . lovastatin (MEVACOR) 20 MG tablet Take 1 tablet (20 mg total) by mouth at bedtime. At least once a week. 90 tablet 0  . omeprazole (PRILOSEC) 20 MG capsule TAKE 1 CAPSULE DAILY 90 capsule 1  . tadalafil (CIALIS) 10 MG tablet Take 1 tablet (10 mg total) by mouth daily as needed for erectile dysfunction. (Patient not taking: Reported on 10/15/2014) 10  tablet 0   Objective: BP 160/88 mmHg  Pulse 83  Temp(Src) 98.1 F (36.7 C)  Wt 174 lb (78.926 kg) Gen: NAD, resting comfortably CV: RRR no murmurs rubs or gallops Lungs: CTAB no crackles, wheeze, rhonchi Abdomen: soft/nontender/nondistended/normal bowel sounds. No rebound or guarding.  Ext: no edema Skin: warm, dry Neuro: grossly normal, moves all extremities, perrla   Assessment/Plan:  Tobacco abuse Not ready to quit   Hypertension Poor control. Add HCTZ in combo pill. Take 2 lisinopril 20-hctz 12.'5mg'$  in AM. 2 week follow up.    Return precautions advised.   Meds ordered this encounter  Medications  . lisinopril-hydrochlorothiazide (PRINZIDE,ZESTORETIC) 20-12.5 MG per tablet    Sig: Take 2 tablets by mouth daily.    Dispense:  60 tablet    Refill:  5

## 2014-12-11 NOTE — Assessment & Plan Note (Signed)
Not ready to quit.  

## 2014-12-11 NOTE — Assessment & Plan Note (Signed)
Poor control. Add HCTZ in combo pill. Take 2 lisinopril 20-hctz 12.'5mg'$  in AM. 2 week follow up.

## 2014-12-29 ENCOUNTER — Ambulatory Visit (INDEPENDENT_AMBULATORY_CARE_PROVIDER_SITE_OTHER): Payer: Medicare Other | Admitting: Family Medicine

## 2014-12-29 ENCOUNTER — Encounter: Payer: Self-pay | Admitting: Family Medicine

## 2014-12-29 VITALS — BP 120/64 | HR 84 | Temp 98.4°F | Wt 172.0 lb

## 2014-12-29 DIAGNOSIS — I10 Essential (primary) hypertension: Secondary | ICD-10-CM | POA: Diagnosis not present

## 2014-12-29 MED ORDER — LISINOPRIL-HYDROCHLOROTHIAZIDE 20-12.5 MG PO TABS: 2.0000 | ORAL_TABLET | Freq: Every day | ORAL | Status: DC

## 2014-12-29 NOTE — Progress Notes (Signed)
[name], MD  Subjective:  Richard Navarro is a 69 y.o. year old very pleasant male patient who presents with:  Hypertension-controlled on lisinopril-40-hctz '25mg'$   BP Readings from Last 3 Encounters:  12/29/14 120/64  12/10/14 160/88  10/30/14 130/78   Home BP monitoring-118/64 most recently Compliant with medications-yes without side effects ROS-Denies any CP, HA, SOB, blurry vision, LE edema.   Past Medical History- diabetes controlled without medication, tobacco abuse, gout, depression history, ED  Medications- reviewed and updated Current Outpatient Prescriptions  Medication Sig Dispense Refill  . allopurinol (ZYLOPRIM) 100 MG tablet TAKE 1 TABLET DAILY 90 tablet 1  . aspirin 81 MG tablet Take 81 mg by mouth daily.    . indomethacin (INDOCIN) 25 MG capsule Take 25 mg by mouth 2 (two) times daily with a meal.    . lisinopril-hydrochlorothiazide (PRINZIDE,ZESTORETIC) 20-12.5 MG per tablet Take 2 tablets by mouth daily. 60 tablet 5  . lovastatin (MEVACOR) 20 MG tablet Take 1 tablet (20 mg total) by mouth at bedtime. At least once a week. 90 tablet 0  . omeprazole (PRILOSEC) 20 MG capsule TAKE 1 CAPSULE DAILY 90 capsule 1  . tadalafil (CIALIS) 10 MG tablet Take 1 tablet (10 mg total) by mouth daily as needed for erectile dysfunction. (Patient not taking: Reported on 12/29/2014) 10 tablet 0   No current facility-administered medications for this visit.    Objective: BP 120/64 mmHg  Pulse 84  Temp(Src) 98.4 F (36.9 C)  Wt 172 lb (78.019 kg) Gen: NAD, resting comfortably CV: RRR no murmurs rubs or gallops Lungs: CTAB no crackles, wheeze, rhonchi Ext: no edema   Assessment/Plan:  Hypertension controlled on lisinopril-40-hctz '25mg'$ . Continue current rx-sent to mail order. 6 month follow up.     Meds ordered this encounter  Medications  . lisinopril-hydrochlorothiazide (PRINZIDE,ZESTORETIC) 20-12.5 MG per tablet    Sig: Take 2 tablets by mouth daily.    Dispense:   180 tablet    Refill:  3

## 2014-12-29 NOTE — Patient Instructions (Signed)
BP looks great. Sent rx to mail order.   See you back in 6 months

## 2014-12-29 NOTE — Assessment & Plan Note (Signed)
controlled on lisinopril-40-hctz '25mg'$ . Continue current rx-sent to mail order. 6 month follow up.

## 2015-02-18 ENCOUNTER — Other Ambulatory Visit: Payer: Self-pay | Admitting: Family Medicine

## 2015-03-08 ENCOUNTER — Other Ambulatory Visit: Payer: Self-pay | Admitting: Family Medicine

## 2015-04-15 ENCOUNTER — Ambulatory Visit (INDEPENDENT_AMBULATORY_CARE_PROVIDER_SITE_OTHER): Payer: Medicare Other | Admitting: Family Medicine

## 2015-04-15 ENCOUNTER — Encounter: Payer: Self-pay | Admitting: Family Medicine

## 2015-04-15 VITALS — BP 110/70 | HR 86 | Temp 98.1°F | Wt 174.0 lb

## 2015-04-15 DIAGNOSIS — R351 Nocturia: Secondary | ICD-10-CM

## 2015-04-15 DIAGNOSIS — E785 Hyperlipidemia, unspecified: Secondary | ICD-10-CM

## 2015-04-15 DIAGNOSIS — N401 Enlarged prostate with lower urinary tract symptoms: Secondary | ICD-10-CM | POA: Diagnosis not present

## 2015-04-15 DIAGNOSIS — M1990 Unspecified osteoarthritis, unspecified site: Secondary | ICD-10-CM | POA: Diagnosis not present

## 2015-04-15 DIAGNOSIS — I10 Essential (primary) hypertension: Secondary | ICD-10-CM

## 2015-04-15 DIAGNOSIS — E119 Type 2 diabetes mellitus without complications: Secondary | ICD-10-CM | POA: Diagnosis not present

## 2015-04-15 DIAGNOSIS — Z720 Tobacco use: Secondary | ICD-10-CM

## 2015-04-15 DIAGNOSIS — Z125 Encounter for screening for malignant neoplasm of prostate: Secondary | ICD-10-CM

## 2015-04-15 LAB — POCT URINALYSIS DIPSTICK
BILIRUBIN UA: NEGATIVE
Glucose, UA: NEGATIVE
Ketones, UA: NEGATIVE
Leukocytes, UA: NEGATIVE
NITRITE UA: NEGATIVE
Protein, UA: NEGATIVE
RBC UA: NEGATIVE
Spec Grav, UA: 1.015
Urobilinogen, UA: 0.2
pH, UA: 7

## 2015-04-15 LAB — BASIC METABOLIC PANEL
BUN: 13 mg/dL (ref 6–23)
CALCIUM: 9.1 mg/dL (ref 8.4–10.5)
CO2: 32 mEq/L (ref 19–32)
CREATININE: 1.27 mg/dL (ref 0.40–1.50)
Chloride: 100 mEq/L (ref 96–112)
GFR: 71.87 mL/min (ref >60.00)
Glucose, Bld: 77 mg/dL (ref 70–99)
Potassium: 4 mEq/L (ref 3.5–5.1)
Sodium: 138 mEq/L (ref 135–145)

## 2015-04-15 LAB — HEMOGLOBIN A1C: HEMOGLOBIN A1C: 6.1 % (ref 4.6–6.5)

## 2015-04-15 LAB — PSA: PSA: 0.34 ng/mL (ref 0.10–4.00)

## 2015-04-15 MED ORDER — TADALAFIL 10 MG PO TABS: 10.0000 mg | ORAL_TABLET | Freq: Every day | ORAL | Status: DC | PRN

## 2015-04-15 MED ORDER — DICLOFENAC SODIUM 1 % TD GEL: 2.0000 g | Freq: Four times a day (QID) | TRANSDERMAL | Status: DC | PRN

## 2015-04-15 NOTE — Patient Instructions (Addendum)
MOST IMPORTANTLY- QUIT SMOKING  Medication Instructions:  No changes Trial voltaren gel for left shoulder  Other Instructions:  BP looks great  Sign release of information at the front desk for colonoscopy from [place]: Before you leave (blood and urine)  Testing/Procedures/Immunizations: Flu shot around October or so  Follow-Up (all visit scheduling, rescheduling, cancellations including labs should be scheduled at front desk): 6 months  Sooner if new or worsening symptoms

## 2015-04-15 NOTE — Assessment & Plan Note (Signed)
Encouraged cessation, not ready. Thinks he will live to 89 even if he continues to smoke-

## 2015-04-15 NOTE — Assessment & Plan Note (Addendum)
S: mild poor controll. Would prefer to avoid oral if possible. 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 Occasional pop in shoulder. Likely arthritis.  A/P:trial voltaren gel- CKD stage II and would help avoid progression.

## 2015-04-15 NOTE — Assessment & Plan Note (Signed)
S: controlled.  No meds.  Lab Results  Component Value Date   HGBA1C 6.1 04/15/2015  A/P:Continue current meds. Needs foot exam and repeat encouragement next visit.

## 2015-04-15 NOTE — Assessment & Plan Note (Signed)
S: controlled. With last ldl 99 A/P:Continue current meds:  Lovastatin twice a week. Fortunately no myalgias or fatigue at this dose.

## 2015-04-15 NOTE — Assessment & Plan Note (Signed)
S: controlled. Lisinopril 20-12.5 hctz daily 2 pills A/P:Continue current meds. Was concerned about potassium but normal levels on hctz.

## 2015-04-15 NOTE — Progress Notes (Signed)
[name], MD  Subjective:  Richard Navarro is a 69 y.o. year old very pleasant male patient who presents with:  See problem oriented charting Concerned about his prostate- Occasionally slow start in the morning. Random strong odor color. Dark urine at time. Occasionally with some low back pain. Pees 2-3x a night. BPH ROS- as noted above with additions no chest pain, shortness of breath, hypoglycemia or blurry vision  Past Medical History-  Patient Active Problem List   Diagnosis Date Noted  . Tobacco abuse 03/26/2014    Priority: High  . Diabetes mellitus without complication     Priority: High  . Erectile dysfunction 04/09/2014    Priority: Medium  . Hypertension     Priority: Medium  . Gout     Priority: Medium  . Depression     Priority: Medium  . Hyperlipidemia     Priority: Medium  . CKD (chronic kidney disease), stage II 04/17/2014    Priority: Low  . Prostate cancer screening 04/17/2014    Priority: Low  . Arthritis 03/26/2014    Priority: Low  . Personal history of colonic polyps 03/26/2014    Priority: Low  . GERD (gastroesophageal reflux disease)     Priority: Low     Medications- reviewed and updated Current Outpatient Prescriptions  Medication Sig Dispense Refill  . allopurinol (ZYLOPRIM) 100 MG tablet TAKE 1 TABLET DAILY 90 tablet 0  . aspirin 81 MG tablet Take 81 mg by mouth daily.    . indomethacin (INDOCIN) 25 MG capsule Take 25 mg by mouth 2 (two) times daily with a meal.    . lisinopril-hydrochlorothiazide (PRINZIDE,ZESTORETIC) 20-12.5 MG per tablet Take 2 tablets by mouth daily. 180 tablet 3  . lovastatin (MEVACOR) 20 MG tablet Take 1 tablet (20 mg total) by mouth at bedtime. At least once a week. 90 tablet 0  . omeprazole (PRILOSEC) 20 MG capsule TAKE 1 CAPSULE DAILY 90 capsule 2  . tadalafil (CIALIS) 10 MG tablet Take 1 tablet (10 mg total) by mouth daily as needed for erectile dysfunction. (Patient not taking: Reported on 12/29/2014) 10  tablet 0   Objective: BP 110/70 mmHg  Pulse 86  Temp(Src) 98.1 F (36.7 C)  Wt 174 lb (78.926 kg) Gen: NAD, resting comfortably CV: RRR no murmurs rubs or gallops Lungs: CTAB no crackles, wheeze, rhonchi Abdomen: soft/nontender/nondistended/normal bowel sounds. No rebound or guarding.  Rectal: normal tone, diffusely enlarged prostate, no masses or tenderness Ext: no edema Skin: warm, dry Neuro: grossly normal, moves all extremities   Assessment/Plan:  Diabetes mellitus without complication S: controlled.  No meds.  Lab Results  Component Value Date   HGBA1C 6.1 04/15/2015  A/P:Continue current meds. Needs foot exam and repeat encouragement next visit.   Hypertension S: controlled. Lisinopril 20-12.5 hctz daily 2 pills A/P:Continue current meds. Was concerned about potassium but normal levels on hctz.   Hyperlipidemia S: controlled. With last ldl 99 A/P:Continue current meds:  Lovastatin twice a week. Fortunately no myalgias or fatigue at this dose.   Tobacco abuse Encouraged cessation, not ready. Thinks he will live to 89 even if he continues to smoke-   Prostate cancer screening BPH on exam with nocturia. Surprised PSA so low.   Arthritis S: mild poor controll. Would prefer to avoid oral if possible. 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 Occasional pop in shoulder. Likely arthritis.  A/P:trial voltaren gel- CKD stage II  and would help avoid progression.   6 months.   Results for orders placed or performed in visit on 04/15/15 (from the past 24 hour(s))  Basic metabolic panel     Status: None   Collection Time: 04/15/15  2:55 PM  Result Value Ref Range   Sodium 138 135 - 145 mEq/L   Potassium 4.0 3.5 - 5.1 mEq/L   Chloride 100 96 - 112 mEq/L   CO2 32 19 - 32 mEq/L   Glucose, Bld 77 70 - 99 mg/dL   BUN 13 6 - 23 mg/dL   Creatinine, Ser 1.27 0.40 - 1.50 mg/dL   Calcium 9.1 8.4 - 10.5 mg/dL   GFR  71.87 >60.00 mL/min  Hemoglobin A1c     Status: None   Collection Time: 04/15/15  2:55 PM  Result Value Ref Range   Hgb A1c MFr Bld 6.1 4.6 - 6.5 %  PSA     Status: None   Collection Time: 04/15/15  2:55 PM  Result Value Ref Range   PSA 0.34 0.10 - 4.00 ng/mL  POCT urinalysis dipstick     Status: None   Collection Time: 04/15/15  5:11 PM  Result Value Ref Range   Color, UA yellow    Clarity, UA clear    Glucose, UA n    Bilirubin, UA n    Ketones, UA n    Spec Grav, UA 1.015    Blood, UA n    pH, UA 7.0    Protein, UA n    Urobilinogen, UA 0.2    Nitrite, UA n    Leukocytes, UA Negative Negative     Meds ordered this encounter  Medications  . diclofenac sodium (VOLTAREN) 1 % GEL    Sig: Apply 2 g topically 4 (four) times daily as needed (Left shoulder arthritis).    Dispense:  100 g    Refill:  5  . tadalafil (CIALIS) 10 MG tablet    Sig: Take 1 tablet (10 mg total) by mouth daily as needed for erectile dysfunction.    Dispense:  10 tablet    Refill:  2

## 2015-04-15 NOTE — Assessment & Plan Note (Signed)
BPH on exam with nocturia. Surprised PSA so low.

## 2015-05-17 ENCOUNTER — Other Ambulatory Visit: Payer: Self-pay | Admitting: Family Medicine

## 2015-06-22 ENCOUNTER — Ambulatory Visit (INDEPENDENT_AMBULATORY_CARE_PROVIDER_SITE_OTHER): Payer: Medicare Other

## 2015-06-22 DIAGNOSIS — Z23 Encounter for immunization: Secondary | ICD-10-CM

## 2015-10-21 ENCOUNTER — Ambulatory Visit (INDEPENDENT_AMBULATORY_CARE_PROVIDER_SITE_OTHER): Payer: Medicare Other | Admitting: Family Medicine

## 2015-10-21 ENCOUNTER — Encounter: Payer: Self-pay | Admitting: Family Medicine

## 2015-10-21 ENCOUNTER — Other Ambulatory Visit: Payer: Self-pay | Admitting: Family Medicine

## 2015-10-21 VITALS — BP 118/64 | HR 69 | Temp 98.2°F | Wt 182.0 lb

## 2015-10-21 DIAGNOSIS — E119 Type 2 diabetes mellitus without complications: Secondary | ICD-10-CM | POA: Diagnosis not present

## 2015-10-21 DIAGNOSIS — Z20828 Contact with and (suspected) exposure to other viral communicable diseases: Secondary | ICD-10-CM

## 2015-10-21 DIAGNOSIS — Z720 Tobacco use: Secondary | ICD-10-CM

## 2015-10-21 DIAGNOSIS — I10 Essential (primary) hypertension: Secondary | ICD-10-CM

## 2015-10-21 DIAGNOSIS — M1990 Unspecified osteoarthritis, unspecified site: Secondary | ICD-10-CM

## 2015-10-21 DIAGNOSIS — E785 Hyperlipidemia, unspecified: Secondary | ICD-10-CM | POA: Diagnosis not present

## 2015-10-21 LAB — COMPREHENSIVE METABOLIC PANEL
ALK PHOS: 77 U/L (ref 39–117)
ALT: 16 U/L (ref 0–53)
AST: 18 U/L (ref 0–37)
Albumin: 4.2 g/dL (ref 3.5–5.2)
BUN: 15 mg/dL (ref 6–23)
CALCIUM: 8.8 mg/dL (ref 8.4–10.5)
CO2: 28 mEq/L (ref 19–32)
Chloride: 100 mEq/L (ref 96–112)
Creatinine, Ser: 1.33 mg/dL (ref 0.40–1.50)
GFR: 68.04 mL/min (ref >60.00)
Glucose, Bld: 89 mg/dL (ref 70–99)
Potassium: 3.7 mEq/L (ref 3.5–5.1)
SODIUM: 135 meq/L (ref 135–145)
TOTAL PROTEIN: 6.9 g/dL (ref 6.0–8.3)
Total Bilirubin: 0.6 mg/dL (ref 0.2–1.2)

## 2015-10-21 LAB — CBC
HCT: 46.2 % (ref 39.0–52.0)
Hemoglobin: 15.7 g/dL (ref 13.0–17.0)
MCHC: 34 g/dL (ref 30.0–36.0)
MCV: 90.8 fl (ref 78.0–100.0)
Platelets: 263 10*3/uL (ref 150.0–400.0)
RBC: 5.09 Mil/uL (ref 4.22–5.81)
RDW: 13.7 % (ref 11.5–15.5)
WBC: 6 10*3/uL (ref 4.0–10.5)

## 2015-10-21 LAB — HEMOGLOBIN A1C: Hgb A1c MFr Bld: 6.5 % (ref 4.6–6.5)

## 2015-10-21 LAB — LDL CHOLESTEROL, DIRECT: LDL DIRECT: 126 mg/dL

## 2015-10-21 MED ORDER — TADALAFIL 10 MG PO TABS: 10.0000 mg | ORAL_TABLET | Freq: Every day | ORAL | Status: DC | PRN

## 2015-10-21 MED ORDER — DICLOFENAC SODIUM 1 % TD GEL: 2.0000 g | Freq: Four times a day (QID) | TRANSDERMAL | Status: DC | PRN

## 2015-10-21 MED ORDER — OMEPRAZOLE 20 MG PO CPDR: 20.0000 mg | DELAYED_RELEASE_CAPSULE | Freq: Every day | ORAL | Status: DC

## 2015-10-21 MED ORDER — ALLOPURINOL 100 MG PO TABS: 100.0000 mg | ORAL_TABLET | Freq: Every day | ORAL | Status: DC

## 2015-10-21 MED ORDER — LOVASTATIN 20 MG PO TABS: ORAL_TABLET | ORAL | Status: DC

## 2015-10-21 MED ORDER — LISINOPRIL-HYDROCHLOROTHIAZIDE 20-12.5 MG PO TABS: 2.0000 | ORAL_TABLET | Freq: Every day | ORAL | Status: DC

## 2015-10-21 MED ORDER — ASPIRIN 81 MG PO TABS: 81.0000 mg | ORAL_TABLET | Freq: Every day | ORAL | Status: DC

## 2015-10-21 NOTE — Assessment & Plan Note (Signed)
S: remains diet controlled but a1c creeping up from around 6 now to 6.5 Lab Results  Component Value Date   HGBA1C 6.5 10/21/2015  A/P: 8 lbs up in weight- work to reverse trend. 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

## 2015-10-21 NOTE — Assessment & Plan Note (Signed)
S: controlled well on Lisinopril 20-12.5 hctz daily 2 pills BP Readings from Last 3 Encounters:  10/21/15 118/64  04/15/15 110/70  12/29/14 120/64  A/P:Continue current medications- doing well

## 2015-10-21 NOTE — Assessment & Plan Note (Signed)
voltaren gel L shoulder has been effective

## 2015-10-21 NOTE — Patient Instructions (Addendum)
Get eye exam schedule and have report faxed to [place] at [phone].  We will refer you for colonoscopy. We may have to get a direct copy of your colonoscopy but last notes from [place] said you were due- we will see if GI will use this or not.   Update labs today  Refilled all medications

## 2015-10-21 NOTE — Progress Notes (Signed)
[name], MD  Subjective:  Richard Navarro is a 70 y.o. year old very pleasant male patient who presents for/with See problem oriented charting ROS- no hypoglycemia. No chest pain or shortness of breath. No headache or blurry vision.   Past Medical History-  Patient Active Problem List   Diagnosis Date Noted  . Tobacco abuse 03/26/2014    Priority: High  . Diabetes mellitus without complication ([place])     Priority: High  . Erectile dysfunction 04/09/2014    Priority: Medium  . Hypertension     Priority: Medium  . Gout     Priority: Medium  . Depression     Priority: Medium  . Hyperlipidemia     Priority: Medium  . CKD (chronic kidney disease), stage II 04/17/2014    Priority: Low  . Prostate cancer screening 04/17/2014    Priority: Low  . Arthritis 03/26/2014    Priority: Low  . Personal history of colonic polyps 03/26/2014    Priority: Low  . GERD (gastroesophageal reflux disease)     Priority: Low    Medications- reviewed and updated Current Outpatient Prescriptions  Medication Sig Dispense Refill  . allopurinol (ZYLOPRIM) 100 MG tablet Take 1 tablet (100 mg total) by mouth daily. 90 tablet 3  . aspirin 81 MG tablet Take 1 tablet (81 mg total) by mouth daily. 90 tablet 3  . diclofenac sodium (VOLTAREN) 1 % GEL Apply 2 g topically 4 (four) times daily as needed (Left shoulder arthritis). 300 g 3  . lisinopril-hydrochlorothiazide (PRINZIDE,ZESTORETIC) 20-12.5 MG tablet Take 2 tablets by mouth daily. 180 tablet 3  . lovastatin (MEVACOR) 20 MG tablet TAKE 2-3 TIMES PER WEEK 36 tablet 3  . omeprazole (PRILOSEC) 20 MG capsule Take 1 capsule (20 mg total) by mouth daily. 90 capsule 3  . tadalafil (CIALIS) 10 MG tablet Take 1 tablet (10 mg total) by mouth daily as needed for erectile dysfunction (every 72 hours maximum). 10 tablet 3   No current facility-administered medications for this visit.    Objective: BP 118/64 mmHg  Pulse 69  Temp(Src) 98.2 F (36.8 C)   Wt 182 lb (82.555 kg) Gen: NAD, resting comfortably CV: RRR no murmurs rubs or gallops Lungs: CTAB no crackles, wheeze, rhonchi Abdomen: soft/nontender/nondistended/normal bowel sounds.  Ext: no edema, 2+ PT pulses Skin: warm, dry Neuro: grossly normal, moves all extremities  Diabetic Foot Exam - Simple   Simple Foot Form  Diabetic Foot exam was performed with the following findings:  Yes 10/21/2015  2:46 PM  Visual Inspection  No deformities, no ulcerations, no other skin breakdown bilaterally:  Yes  Sensation Testing  Intact to touch and monofilament testing bilaterally:  Yes  Pulse Check  Posterior Tibialis and Dorsalis pulse intact bilaterally:  Yes  Comments     Assessment/Plan:  Tobacco abuse 0.5-2 PPD. Trying to cut back on his own. Not ready for quitting intervention  Hypertension S: controlled well on Lisinopril 20-12.5 hctz daily 2 pills BP Readings from Last 3 Encounters:  10/21/15 118/64  04/15/15 110/70  12/29/14 120/64  A/P:Continue current medications- doing well   Hyperlipidemia S: poorly controlled on lovastatin 2x a week. No myalgias.  Lab Results  Component Value Date   CHOL 184 03/26/2014   HDL 30.80* 03/26/2014   LDLCALC 143* 03/26/2014   LDLDIRECT 126.0 10/21/2015   TRIG 49.0 03/26/2014   CHOLHDL 6 03/26/2014   A/P: does sometimes get 3x a week. Encouraged increase to 3x a week and reversal of  8 lbs weight gain.     Diabetes mellitus without complication S: remains diet controlled but a1c creeping up from around 6 now to 6.5 Lab Results  Component Value Date   HGBA1C 6.5 10/21/2015  A/P: 8 lbs up in weight- work to reverse trend. 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   nonfasting Orders Placed This Encounter  Procedures  . CBC    [place]  . Comprehensive metabolic panel    [place]  . LDL  cholesterol, direct    [place]  . Hemoglobin A1c    [place]  . Hepatitis C antibody, reflex    [facility]    Meds ordered this encounter  Medications  . allopurinol (ZYLOPRIM) 100 MG tablet    Sig: Take 1 tablet (100 mg total) by mouth daily.    Dispense:  90 tablet    Refill:  3  . aspirin 81 MG tablet    Sig: Take 1 tablet (81 mg total) by mouth daily.    Dispense:  90 tablet    Refill:  3  . diclofenac sodium (VOLTAREN) 1 % GEL    Sig: Apply 2 g topically 4 (four) times daily as needed (Left shoulder arthritis).    Dispense:  300 g    Refill:  3  . lisinopril-hydrochlorothiazide (PRINZIDE,ZESTORETIC) 20-12.5 MG tablet    Sig: Take 2 tablets by mouth daily.    Dispense:  180 tablet    Refill:  3  . lovastatin (MEVACOR) 20 MG tablet    Sig: TAKE 2-3 TIMES PER WEEK    Dispense:  36 tablet    Refill:  3  . omeprazole (PRILOSEC) 20 MG capsule    Sig: Take 1 capsule (20 mg total) by mouth daily.    Dispense:  90 capsule    Refill:  3  . tadalafil (CIALIS) 10 MG tablet    Sig: Take 1 tablet (10 mg total) by mouth daily as needed for erectile dysfunction (every 72 hours maximum).    Dispense:  10 tablet    Refill:  3

## 2015-10-21 NOTE — Assessment & Plan Note (Signed)
0.5-2 PPD. Trying to cut back on his own. Not ready for quitting intervention

## 2015-10-21 NOTE — Assessment & Plan Note (Signed)
S: poorly controlled on lovastatin 2x a week. No myalgias.  Lab Results  Component Value Date   CHOL 184 03/26/2014   HDL 30.80* 03/26/2014   LDLCALC 143* 03/26/2014   LDLDIRECT 126.0 10/21/2015   TRIG 49.0 03/26/2014   CHOLHDL 6 03/26/2014   A/P: does sometimes get 3x a week. Encouraged increase to 3x a week and reversal of 8 lbs weight gain.

## 2015-10-22 LAB — HEPATITIS C ANTIBODY: HCV Ab: NEGATIVE

## 2015-12-07 ENCOUNTER — Encounter: Payer: Self-pay | Admitting: Family Medicine

## 2015-12-07 ENCOUNTER — Ambulatory Visit (INDEPENDENT_AMBULATORY_CARE_PROVIDER_SITE_OTHER): Payer: Medicare Other | Admitting: Family Medicine

## 2015-12-07 VITALS — BP 144/76 | HR 74 | Temp 97.8°F | Wt 174.0 lb

## 2015-12-07 DIAGNOSIS — E119 Type 2 diabetes mellitus without complications: Secondary | ICD-10-CM

## 2015-12-07 DIAGNOSIS — F4310 Post-traumatic stress disorder, unspecified: Secondary | ICD-10-CM | POA: Insufficient documentation

## 2015-12-07 NOTE — Patient Instructions (Signed)
Let's try to push the water intake to 60 oz a day.   Follow up in about 2 weeks to see if symptoms are improving (schedule in 30 minute slot some more)  Sign release of information at the check out desk for colonoscopy records  Try to stay in cool environments. Lower stress environments are ideal as well.
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
## 2015-12-22 ENCOUNTER — Ambulatory Visit (INDEPENDENT_AMBULATORY_CARE_PROVIDER_SITE_OTHER): Payer: Medicare Other | Admitting: Family Medicine

## 2015-12-22 ENCOUNTER — Encounter: Payer: Self-pay | Admitting: Family Medicine

## 2015-12-22 VITALS — BP 122/68 | HR 89 | Temp 98.5°F | Wt 177.0 lb

## 2015-12-22 DIAGNOSIS — F4310 Post-traumatic stress disorder, unspecified: Secondary | ICD-10-CM

## 2015-12-22 DIAGNOSIS — E785 Hyperlipidemia, unspecified: Secondary | ICD-10-CM

## 2015-12-22 NOTE — Patient Instructions (Signed)
Trial off the lovastatin for at least a month  If you are still having the muscle aches and weakness within a month, return to see me   If doing better, call me, may restart just once a week

## 2015-12-22 NOTE — Assessment & Plan Note (Signed)
S: prior issues of dark urine, headaches have improved with increasing water  Intake. Continues to have muscle aches throughout legs and low back comparable to what he had on past on lipitor, simvastatin, pravastatin. He is on lovastatin twice a week.  A/P: I am concerned myalgias and diffuse weakness could be statin related- will trial off lovastatin for 1 month- if symptoms persist will return to see me.

## 2015-12-22 NOTE — Progress Notes (Signed)
Subjective:  Richard Navarro is a 70 y.o. year old very pleasant male patient who presents for/with See problem oriented charting ROS- No chest pain or shortness of breath. No headache or blurry vision.   Past Medical History-  Patient Active Problem List   Diagnosis Date Noted  . PTSD (post-traumatic stress disorder) 12/07/2015    Priority: High  . Tobacco abuse 03/26/2014    Priority: High  . Diabetes mellitus without complication ([place])     Priority: High  . Erectile dysfunction 04/09/2014    Priority: Medium  . Hypertension     Priority: Medium  . Gout     Priority: Medium  . Depression     Priority: Medium  . Hyperlipidemia     Priority: Medium  . CKD (chronic kidney disease), stage II 04/17/2014    Priority: Low  . Prostate cancer screening 04/17/2014    Priority: Low  . Arthritis 03/26/2014    Priority: Low  . Personal history of colonic polyps 03/26/2014    Priority: Low  . GERD (gastroesophageal reflux disease)     Priority: Low    Medications- reviewed and updated Current Outpatient Prescriptions  Medication Sig Dispense Refill  . allopurinol (ZYLOPRIM) 100 MG tablet Take 1 tablet (100 mg total) by mouth daily. 90 tablet 3  . aspirin 81 MG tablet Take 1 tablet (81 mg total) by mouth daily. 90 tablet 3  . diclofenac sodium (VOLTAREN) 1 % GEL Apply 2 g topically 4 (four) times daily as needed (Left shoulder arthritis). 300 g 3  . lisinopril-hydrochlorothiazide (PRINZIDE,ZESTORETIC) 20-12.5 MG tablet Take 2 tablets by mouth daily. 180 tablet 3  . lovastatin (MEVACOR) 20 MG tablet TAKE 2-3 TIMES PER WEEK 36 tablet 3  . omeprazole (PRILOSEC) 20 MG capsule Take 1 capsule (20 mg total) by mouth daily. 90 capsule 3  . tadalafil (CIALIS) 10 MG tablet Take 1 tablet (10 mg total) by mouth daily as needed for erectile dysfunction (every 72 hours maximum). 10 tablet 3   No current facility-administered medications for this visit.    Objective: BP 122/68 mmHg  Pulse 89   Temp(Src) 98.5 F (36.9 C)  Wt 177 lb (80.287 kg) Gen: NAD, resting comfortably CV: RRR no murmurs rubs or gallops Lungs: CTAB no crackles, wheeze, rhonchi Abdomen: soft/nontender/nondistended/normal bowel sounds. No rebound or guarding.  Ext: no edema, muscles nontender at present Skin: warm, dry Neuro: grossly normal, moves all extremities  Assessment/Plan:  Hyperlipidemia S: prior issues of dark urine, headaches have improved with increasing water  Intake. Continues to have muscle aches throughout legs and low back comparable to what he had on past on lipitor, simvastatin, pravastatin. He is on lovastatin twice a week.  A/P: I am concerned myalgias and diffuse weakness could be statin related- will trial off lovastatin for 1 month- if symptoms persist will return to see me. If not (as in myalgias resolve), will update me by phone and would consider starting lovastatin back once a week.    PTSD (post-traumatic stress disorder) S: further out from march 6th- think sa[place] will be another tough month for him. He is considering moving to [place] again as had 5 great years there and lower stress potentially being away from ex- wife that he still doesn't live with but has constant contact with- she has not been agreeable to getting married again but they are very close A/P: counseled patient, glad he is doing better. No SI. Not interested in counseling or medication at present.  also encouraged smoking cessation Return precautions advised.  [name], MD

## 2015-12-22 NOTE — Assessment & Plan Note (Signed)
S: further out from march 6th- think saugust will be another tough month for him. He is considering moving to [place] again as had 5 great years there and lower stress potentially being away from ex- wife that he still doesn't live with but has constant contact with- she has not been agreeable to getting married again but they are very close A/P: counseled patient, glad he is doing better. No SI. Not interested in counseling or medication at present.

## 2015-12-31 ENCOUNTER — Ambulatory Visit: Payer: Medicare Other | Admitting: Family Medicine

## 2016-01-12 ENCOUNTER — Encounter: Payer: Self-pay | Admitting: Family Medicine

## 2016-01-12 ENCOUNTER — Ambulatory Visit (INDEPENDENT_AMBULATORY_CARE_PROVIDER_SITE_OTHER): Payer: Medicare Other | Admitting: Family Medicine

## 2016-01-12 VITALS — BP 118/78 | HR 76 | Temp 98.1°F | Ht 75.0 in | Wt 173.0 lb

## 2016-01-12 DIAGNOSIS — E785 Hyperlipidemia, unspecified: Secondary | ICD-10-CM

## 2016-01-12 NOTE — Assessment & Plan Note (Signed)
S: had been having muscle aches throughout legs and low back. We stopped lovastatin from twice a week and resolved completely A/P: we will restart at once a week lovastatin- if this does not work permanently discontinue statin for primary prevention.

## 2016-01-12 NOTE — Patient Instructions (Signed)
Let's try lovastatin once a week. If your muscle cramps come back- stop and this will be our last attempt at cholesterol medicine  As always- quit smoking

## 2016-01-12 NOTE — Progress Notes (Signed)
Subjective:  Richard Navarro is a 70 y.o. year old very pleasant male patient who presents for/with See problem oriented charting ROS- no chest pain ro shortness of breath. No myalgias off statin. No blurry vision. No hypoglycemia.Marland Kitchensee any ROS included in HPI as well.   Past Medical History-  Patient Active Problem List   Diagnosis Date Noted  . PTSD (post-traumatic stress disorder) 12/07/2015    Priority: High  . Tobacco abuse 03/26/2014    Priority: High  . Diabetes mellitus without complication ([place])     Priority: High  . Erectile dysfunction 04/09/2014    Priority: Medium  . Hypertension     Priority: Medium  . Gout     Priority: Medium  . Depression     Priority: Medium  . Hyperlipidemia     Priority: Medium  . CKD (chronic kidney disease), stage II 04/17/2014    Priority: Low  . Prostate cancer screening 04/17/2014    Priority: Low  . Arthritis 03/26/2014    Priority: Low  . Personal history of colonic polyps 03/26/2014    Priority: Low  . GERD (gastroesophageal reflux disease)     Priority: Low    Medications- reviewed and updated Current Outpatient Prescriptions  Medication Sig Dispense Refill  . allopurinol (ZYLOPRIM) 100 MG tablet Take 1 tablet (100 mg total) by mouth daily. 90 tablet 3  . aspirin 81 MG tablet Take 1 tablet (81 mg total) by mouth daily. 90 tablet 3  . diclofenac sodium (VOLTAREN) 1 % GEL Apply 2 g topically 4 (four) times daily as needed (Left shoulder arthritis). 300 g 3  . lisinopril-hydrochlorothiazide (PRINZIDE,ZESTORETIC) 20-12.5 MG tablet Take 2 tablets by mouth daily. 180 tablet 3  . lovastatin (MEVACOR) 20 MG tablet TAKE 2-3 TIMES PER WEEK 36 tablet 3  . omeprazole (PRILOSEC) 20 MG capsule Take 1 capsule (20 mg total) by mouth daily. 90 capsule 3  . tadalafil (CIALIS) 10 MG tablet Take 1 tablet (10 mg total) by mouth daily as needed for erectile dysfunction (every 72 hours maximum). 10 tablet 3   No current facility-administered  medications for this visit.    Objective: BP 118/78 mmHg  Pulse 76  Temp(Src) 98.1 F (36.7 C) (Oral)  Ht '6\' 3"'$  (1.905 m)  Wt 173 lb (78.472 kg)  BMI 21.62 kg/m2  SpO2 98% Gen: NAD, resting comfortably CV: RRR no murmurs rubs or gallops Lungs: CTAB no crackles, wheeze, rhonchi Abdomen: soft/nontender/nondistended/normal bowel sounds. No rebound or guarding.  Ext: no edema Skin: warm, dry Neuro: grossly normal, moves all extremities  Assessment/Plan:  Hyperlipidemia S: had been having muscle aches throughout legs and low back. We stopped lovastatin from twice a week and resolved completely A/P: we will restart at once a week lovastatin- if this does not work permanently discontinue statin for primary prevention.   Patient states he has lost 4 lbs in last few weeks (but previously had gained 3 over few weeks before that_. Low appetite per ex wife. She was concerned. Patient states he always has a lower appetite in summer heat. He feels fine otherwise- just prefers to stay cool. Ex wife also worried about heart attack risk (best thing he can do here is quit smoking). He has no symptoms of chest pain or shortness of breath fortunately.   Encouraged smoking cessatoin as always  Has September follow up. Return precautions advised.   The duration of face-to-face time during this visit was 15 minutes. Greater than 50% of this time was spent  in counseling, explanation of diagnosis, planning of further management, and/or coordination of care.     [name], MD

## 2016-01-25 ENCOUNTER — Telehealth: Payer: Self-pay | Admitting: Family Medicine

## 2016-01-25 MED ORDER — LISINOPRIL-HYDROCHLOROTHIAZIDE 20-12.5 MG PO TABS: 2.0000 | ORAL_TABLET | Freq: Every day | ORAL | Status: DC

## 2016-01-25 NOTE — Telephone Encounter (Signed)
The patient has ran out of medication and escripts states that the next mailing date isn't until June 17th. Can we have his Rx sent to [place], [place] [phone]) Just enough till he receives his mail order.

## 2016-01-25 NOTE — Telephone Encounter (Signed)
Sent in #30 Lisinopril HCTZ to [facility] on [address]

## 2016-04-25 ENCOUNTER — Ambulatory Visit (INDEPENDENT_AMBULATORY_CARE_PROVIDER_SITE_OTHER): Payer: Medicare Other | Admitting: Family Medicine

## 2016-04-25 ENCOUNTER — Encounter: Payer: Self-pay | Admitting: Family Medicine

## 2016-04-25 VITALS — BP 138/86 | HR 71 | Temp 97.8°F | Wt 181.6 lb

## 2016-04-25 DIAGNOSIS — I10 Essential (primary) hypertension: Secondary | ICD-10-CM | POA: Diagnosis not present

## 2016-04-25 DIAGNOSIS — Z23 Encounter for immunization: Secondary | ICD-10-CM

## 2016-04-25 DIAGNOSIS — E785 Hyperlipidemia, unspecified: Secondary | ICD-10-CM

## 2016-04-25 DIAGNOSIS — Z79899 Other long term (current) drug therapy: Secondary | ICD-10-CM | POA: Diagnosis not present

## 2016-04-25 DIAGNOSIS — E119 Type 2 diabetes mellitus without complications: Secondary | ICD-10-CM | POA: Diagnosis not present

## 2016-04-25 DIAGNOSIS — Z720 Tobacco use: Secondary | ICD-10-CM

## 2016-04-25 LAB — CBC
HCT: 44.2 % (ref 39.0–52.0)
Hemoglobin: 15.4 g/dL (ref 13.0–17.0)
MCHC: 34.9 g/dL (ref 30.0–36.0)
MCV: 88.8 fl (ref 78.0–100.0)
Platelets: 206 10*3/uL (ref 150.0–400.0)
RBC: 4.97 Mil/uL (ref 4.22–5.81)
RDW: 14 % (ref 11.5–15.5)
WBC: 6.5 10*3/uL (ref 4.0–10.5)

## 2016-04-25 LAB — COMPREHENSIVE METABOLIC PANEL
ALT: 15 U/L (ref 0–53)
AST: 17 U/L (ref 0–37)
Albumin: 4.1 g/dL (ref 3.5–5.2)
Alkaline Phosphatase: 80 U/L (ref 39–117)
BILIRUBIN TOTAL: 0.5 mg/dL (ref 0.2–1.2)
BUN: 18 mg/dL (ref 6–23)
CO2: 30 mEq/L (ref 19–32)
CREATININE: 1.32 mg/dL (ref 0.40–1.50)
Calcium: 8.7 mg/dL (ref 8.4–10.5)
Chloride: 99 mEq/L (ref 96–112)
GFR: 68.54 mL/min (ref >60.00)
GLUCOSE: 97 mg/dL (ref 70–99)
Potassium: 3.6 mEq/L (ref 3.5–5.1)
Sodium: 138 mEq/L (ref 135–145)
Total Protein: 7 g/dL (ref 6.0–8.3)

## 2016-04-25 LAB — LDL CHOLESTEROL, DIRECT: Direct LDL: 148 mg/dL

## 2016-04-25 LAB — VITAMIN B12: VITAMIN B 12: 168 pg/mL — AB (ref 211–911)

## 2016-04-25 LAB — HEMOGLOBIN A1C: Hgb A1c MFr Bld: 6.1 % (ref 4.6–6.5)

## 2016-04-25 NOTE — Progress Notes (Signed)
Subjective:  Richard Navarro is a 70 y.o. year old very pleasant male patient who presents for/with See problem oriented charting ROS- No chest pain or shortness of breath. No headache or blurry vision. .see any ROS included in HPI as well.   Past Medical History-  Patient Active Problem List   Diagnosis Date Noted  . PTSD (post-traumatic stress disorder) 12/07/2015    Priority: High  . Tobacco abuse 03/26/2014    Priority: High  . Diabetes mellitus without complication ([place])     Priority: High  . Erectile dysfunction 04/09/2014    Priority: Medium  . Hypertension     Priority: Medium  . Gout     Priority: Medium  . Depression     Priority: Medium  . Hyperlipidemia     Priority: Medium  . CKD (chronic kidney disease), stage II 04/17/2014    Priority: Low  . Prostate cancer screening 04/17/2014    Priority: Low  . Arthritis 03/26/2014    Priority: Low  . Personal history of colonic polyps 03/26/2014    Priority: Low  . GERD (gastroesophageal reflux disease)     Priority: Low    Medications- reviewed and updated Current Outpatient Prescriptions  Medication Sig Dispense Refill  . allopurinol (ZYLOPRIM) 100 MG tablet Take 1 tablet (100 mg total) by mouth daily. 90 tablet 3  . aspirin 81 MG tablet Take 1 tablet (81 mg total) by mouth daily. 90 tablet 3  . diclofenac sodium (VOLTAREN) 1 % GEL Apply 2 g topically 4 (four) times daily as needed (Left shoulder arthritis). 300 g 3  . lisinopril-hydrochlorothiazide (PRINZIDE,ZESTORETIC) 20-12.5 MG tablet Take 2 tablets by mouth daily. 30 tablet 0  . lovastatin (MEVACOR) 20 MG tablet TAKE 2-3 TIMES PER WEEK 36 tablet 3  . omeprazole (PRILOSEC) 20 MG capsule Take 1 capsule (20 mg total) by mouth daily. 90 capsule 3  . tadalafil (CIALIS) 10 MG tablet Take 1 tablet (10 mg total) by mouth daily as needed for erectile dysfunction (every 72 hours maximum). 10 tablet 3   No current facility-administered medications for this visit.      Objective: BP 138/86 (BP Location: Left Arm, Patient Position: Sitting, Cuff Size: Normal)   Pulse 71   Temp 97.8 F (36.6 C) (Oral)   Wt 181 lb 9.6 oz (82.4 kg)   SpO2 97%   BMI 22.70 kg/m  Gen: NAD, resting comfortably, thin CV: RRR no murmurs rubs or gallops Lungs: CTAB no crackles, wheeze, rhonchi Abdomen: soft/nontender/nondistended/normal bowel sounds. No rebound or guarding.  Ext: no edema Skin: warm, dry Neuro: grossly normal, moves all extremities  Assessment/Plan:  Hypertension S: controlled on lisinopril 40-'25mg'$ .  BP Readings from Last 3 Encounters:  04/25/16 138/86  01/12/16 118/78  12/22/15 122/68  A/P:Continue current meds:  Well controlled  Hyperlipidemia S: poorly controlled on most tolerable dose of lovastatin once a week. No myalgias.  Lab Results  Component Value Date   CHOL 184 03/26/2014   HDL 30.80 (L) 03/26/2014   LDLCALC 143 (H) 03/26/2014   LDLDIRECT 148.0 04/25/2016   TRIG 49.0 03/26/2014   CHOLHDL 6 03/26/2014   A/P: will continue current dose, check LDL   Diabetes mellitus without complication S: well controlled. On no medicine. Diet controlled CBGs- sparing checks- reasonable control Lab Results  Component Value Date   HGBA1C 6.5 10/21/2015   HGBA1C 6.1 04/15/2015   HGBA1C 6.0 10/30/2014   A/P: update a1c today  Tobacco abuse Not ready to quit. Strongly encouraged-  very low motivation. Screen for lung cancer 44 pack years- refer today  6 months verbal  Wants b12 checked with long term ppi use- reasonable Orders Placed This Encounter  Procedures  . Flu vaccine HIGH DOSE PF  . CBC    [place]  . Comprehensive metabolic panel    [place]  . LDL cholesterol, direct    [place]  . Hemoglobin A1c    [place]  . Vitamin B12  . Ambulatory Referral for Lung Cancer Scre    Referral Priority:   Routine    Referral Type:   Consultation    Referral Reason:   Specialty Services Required    Number of Visits Requested:   1    Return precautions advised.  [name], MD

## 2016-04-25 NOTE — Assessment & Plan Note (Signed)
S: well controlled. On no medicine. Diet controlled CBGs- sparing checks- reasonable control Lab Results  Component Value Date   HGBA1C 6.5 10/21/2015   HGBA1C 6.1 04/15/2015   HGBA1C 6.0 10/30/2014   A/P: update a1c today

## 2016-04-25 NOTE — Patient Instructions (Addendum)
We will call you within a week about your referral to pulmonology for lung cancer screening. If you do not hear within 2 weeks, give [place] a call.   As always, advise you to quit smoking  Labs before you leave  No other changes to meds

## 2016-04-25 NOTE — Assessment & Plan Note (Addendum)
Not ready to quit. Strongly encouraged- very low motivation. Screen for lung cancer 44 pack years- refer today

## 2016-04-25 NOTE — Progress Notes (Signed)
Pre visit review using our clinic review tool, if applicable. No additional management support is needed unless otherwise documented below in the visit note. 

## 2016-05-02 ENCOUNTER — Ambulatory Visit (INDEPENDENT_AMBULATORY_CARE_PROVIDER_SITE_OTHER): Payer: Medicare Other | Admitting: Sports Medicine

## 2016-05-02 ENCOUNTER — Encounter: Payer: Self-pay | Admitting: Sports Medicine

## 2016-05-02 VITALS — BP 159/83 | HR 67 | Ht 75.0 in | Wt 181.0 lb

## 2016-05-02 DIAGNOSIS — L84 Corns and callosities: Secondary | ICD-10-CM | POA: Diagnosis not present

## 2016-05-02 DIAGNOSIS — M79675 Pain in left toe(s): Secondary | ICD-10-CM | POA: Diagnosis not present

## 2016-05-02 DIAGNOSIS — M2040 Other hammer toe(s) (acquired), unspecified foot: Secondary | ICD-10-CM | POA: Diagnosis not present

## 2016-05-02 NOTE — Progress Notes (Signed)
  Subjective: Richard Navarro is a 70 y.o. male patient who presents to office for evaluation of Left foot pain at 2nd toe. Reports that sometimes corn hurts with certain shoes and wants to discuss treatment options. Patient denies any other pedal complaints.   Patient Active Problem List   Diagnosis Date Noted  . PTSD (post-traumatic stress disorder) 12/07/2015  . 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 Prostate cancer screening 04/17/2014  . Erectile dysfunction 04/09/2014  . Arthritis 03/26/2014  . Personal history of colonic polyps 03/26/2014  . Tobacco abuse 03/26/2014  . Diabetes mellitus without complication ([place])   . Hypertension   . Gout   . Depression   . GERD (gastroesophageal reflux disease)   . Hyperlipidemia     Current Outpatient Prescriptions on File Prior to Visit  Medication Sig Dispense Refill  . allopurinol (ZYLOPRIM) 100 MG tablet Take 1 tablet (100 mg total) by mouth daily. 90 tablet 3  . aspirin 81 MG tablet Take 1 tablet (81 mg total) by mouth daily. 90 tablet 3  . diclofenac sodium (VOLTAREN) 1 % GEL Apply 2 g topically 4 (four) times daily as needed (Left shoulder arthritis). 300 g 3  . lisinopril-hydrochlorothiazide (PRINZIDE,ZESTORETIC) 20-12.5 MG tablet Take 2 tablets by mouth daily. 30 tablet 0  . lovastatin (MEVACOR) 20 MG tablet TAKE 2-3 TIMES PER WEEK 36 tablet 3  . omeprazole (PRILOSEC) 20 MG capsule Take 1 capsule (20 mg total) by mouth daily. 90 capsule 3  . tadalafil (CIALIS) 10 MG tablet Take 1 tablet (10 mg total) by mouth daily as needed for erectile dysfunction (every 72 hours maximum). 10 tablet 3   No current facility-administered medications on file prior to visit.     Allergies  Allergen Reactions  . Simvastatin     Objective:  General: Alert and oriented x3 in no acute distress  Dermatology: Small soft hyperkeratotic lesion overlying 2nd PIPJ dorsally on left. No open lesions bilateral lower extremities, no  webspace macerations, no ecchymosis bilateral, all nails x 10 are well manicured.  Vascular: Dorsalis Pedis and Posterior Tibial pedal pulses 1/4, Capillary Fill Time 5 seconds,Scant pedal hair growth bilateral, no edema bilateral lower extremities, Temperature gradient within normal limits.  Neurology: Johney Maine sensation intact via light touch bilateral.  Musculoskeletal: Semi-flexible hammertoes 2-5 with minimal tenderness with palpation at PIPJ Left 2nd toe. No other sympathic pedal deformities noted bilateral. No pain with calf compression bilateral.  Strength within normal limits in all groups bilateral.        Assessment and Plan: Problem List Items Addressed This Visit    None    Visit Diagnoses    Hammer toe, unspecified laterality    -  Primary   Corns and callosities       Soft corn   Toe pain, left         -Complete examination performed -Discussed treatement options for hammertoe and soft corn -Patient desires to continue with conservative care -Dispensed left 2nd toe padding and encouraged wider shoes to prevent irritation to toe -Patient to return to office as needed or sooner if condition worsens.  Landis Martins, DPM

## 2016-05-02 NOTE — Patient Instructions (Signed)
Hammer Toes Hammer toes is a condition in which one or more of your toes is permanently flexed. CAUSES  This happens when a muscle imbalance or abnormal bone length makes your small toes buckle. This causes the toe joint to contract and the strong cord-like bands that attach muscles to the bones (tendons) in your toes to shorten.  SIGNS AND SYMPTOMS  Common symptoms of flexible hammer toes include:   A buildup of skin cells (corns). Corns occur where boney bumps come in frequent contact with hard surfaces. For example, where your shoes press and rub.  Irritation.  Inflammation.  Pain.  Limited motion in your toes. DIAGNOSIS  Hammer toes are diagnosed through a physical exam of your toes. During the exam, your health care provider may try to reproduce your symptoms by manipulating your foot. Often, X-ray exams are done to determine the degree of deformity and to make sure that the cause is not a fracture.  TREATMENT  Hammer toes can be treated with corrective surgery. There are several types of surgical procedures that can treat hammer toes. The most common procedures include:  Arthroplasty--A portion of the joint is surgically removed and your toe is straightened. The gap fills in with fibrous tissue. This procedure helps treat pain and deformity and helps restore function.  Fusion--Cartilage between the two bones of the affected joint is taken out and the bones fuse together into one longer bone. This helps keep your toe stable and reduces pain but leaves your toe stiff, yet straight.  Implantation--A portion of your bone is removed and replaced with an implant to restore motion.  Flexor tendon transfers--This procedure repositions the tendons that curl the toes down (flexor tendons). This may be done to release the deforming force that causes your toe to buckle. Several of these procedures require fixing your toe with a pin that is visible at the tip of your toe. The pin keeps the toe  straight during healing. Your health care provider will remove the pin usually within 4-8 weeks after the procedure.    This information is not intended to replace advice given to you by your health care provider. Make sure you discuss any questions you have with your health care provider.   Document Released: 08/04/2000 Document Revised: 08/12/2013 Document Reviewed: 04/14/2013 Elsevier Interactive Patient Education [employer].

## 2016-05-04 ENCOUNTER — Other Ambulatory Visit: Payer: Self-pay | Admitting: Acute Care

## 2016-05-04 DIAGNOSIS — F17210 Nicotine dependence, cigarettes, uncomplicated: Secondary | ICD-10-CM

## 2016-06-02 ENCOUNTER — Telehealth: Payer: Self-pay | Admitting: Acute Care

## 2016-06-02 NOTE — Telephone Encounter (Signed)
Routing to lung nodule pool for follow up.  

## 2016-06-08 NOTE — Telephone Encounter (Signed)
General 05/04/2016 10:08 AM Osa Craver, CMA - -  Note   SDMV scheduled on 06/12/16 at 9am CT ordered Pt voiced understanding and had no further questions.         Nothing further needed at this time.

## 2016-06-12 ENCOUNTER — Encounter: Payer: Medicare Other | Admitting: Acute Care

## 2016-06-12 ENCOUNTER — Inpatient Hospital Stay: Admission: RE | Admit: 2016-06-12 | Payer: Medicare Other | Source: Ambulatory Visit

## 2016-06-29 ENCOUNTER — Encounter: Payer: Medicare Other | Admitting: Acute Care

## 2016-06-29 ENCOUNTER — Inpatient Hospital Stay: Admission: RE | Admit: 2016-06-29 | Payer: Medicare Other | Source: Ambulatory Visit

## 2016-07-10 ENCOUNTER — Ambulatory Visit: Payer: Medicare Other

## 2016-07-20 ENCOUNTER — Telehealth: Payer: Self-pay | Admitting: Family Medicine

## 2016-07-20 NOTE — Telephone Encounter (Signed)
Called Mr. Doverspike to schedule awv appt. Pt did not answer. Will attempt to call pt again.

## 2016-09-09 ENCOUNTER — Other Ambulatory Visit: Payer: Self-pay | Admitting: Family Medicine

## 2016-09-21 ENCOUNTER — Ambulatory Visit (INDEPENDENT_AMBULATORY_CARE_PROVIDER_SITE_OTHER): Payer: Medicare Other

## 2016-09-21 VITALS — BP 132/80 | HR 88 | Ht 75.0 in | Wt 176.0 lb

## 2016-09-21 DIAGNOSIS — Z0000 Encounter for general adult medical examination without abnormal findings: Secondary | ICD-10-CM | POA: Diagnosis not present

## 2016-09-21 NOTE — Progress Notes (Signed)
I have reviewed and agree with note, evaluation, plan. Think lung cancer screening good idea- Manuela Schwartz provided counseling today- would also consider referral to lung cancer screening program instead of direct imaging in future. Will follow how this is processed.   [name], MD

## 2016-09-21 NOTE — Patient Instructions (Addendum)
Richard Navarro , Thank you for taking time to come for your Medicare Wellness Visit. I appreciate your ongoing commitment to your health goals. Please review the following plan we discussed and let me know if I can assist you in the future.   Will fup with eye exam!   Will fup with Dr. Yong Channel regarding colonoscopy and will bring last report as he would like to have the next one done here.   These are the goals we discussed: Goals    . patient          Continue to learn and enjoy people        This is a list of the screening recommended for you and due dates:  Health Maintenance  Topic Date Due  . Eye exam for diabetics  12/21/2014  . Complete foot exam   10/20/2016  . Hemoglobin A1C  10/23/2016  . Colon Cancer Screening  03/29/2019  . Tetanus Vaccine  03/26/2021  . Flu Shot  Completed  . Shingles Vaccine  Completed  .  Hepatitis C: One time screening is recommended by Center for Disease Control  (CDC) for  adults born from [birth year] through 1967.   Completed  . Pneumonia vaccines  Completed        Fall Prevention in the Home Introduction Falls can cause injuries. They can happen to people of all ages. There are many things you can do to make your home safe and to help prevent falls. What can I do on the outside of my home?  Regularly fix the edges of walkways and driveways and fix any cracks.  Remove anything that might make you trip as you walk through a door, such as a raised step or threshold.  Trim any bushes or trees on the path to your home.  Use bright outdoor lighting.  Clear any walking paths of anything that might make someone trip, such as rocks or tools.  Regularly check to see if handrails are loose or broken. Make sure that both sides of any steps have handrails.  Any raised decks and porches should have guardrails on the edges.  Have any leaves, snow, or ice cleared regularly.  Use sand or salt on walking paths during winter.  Clean up any spills in  your garage right away. This includes oil or grease spills. What can I do in the bathroom?  Use night lights.  Install grab bars by the toilet and in the tub and shower. Do not use towel bars as grab bars.  Use non-skid mats or decals in the tub or shower.  If you need to sit down in the shower, use a plastic, non-slip stool.  Keep the floor dry. Clean up any water that spills on the floor as soon as it happens.  Remove soap buildup in the tub or shower regularly.  Attach bath mats securely with double-sided non-slip rug tape.  Do not have throw rugs and other things on the floor that can make you trip. What can I do in the bedroom?  Use night lights.  Make sure that you have a light by your bed that is easy to reach.  Do not use any sheets or blankets that are too big for your bed. They should not hang down onto the floor.  Have a firm chair that has side arms. You can use this for support while you get dressed.  Do not have throw rugs and other things on the floor that can make you  trip. What can I do in the kitchen?  Clean up any spills right away.  Avoid walking on wet floors.  Keep items that you use a lot in easy-to-reach places.  If you need to reach something above you, use a strong step stool that has a grab bar.  Keep electrical cords out of the way.  Do not use floor polish or wax that makes floors slippery. If you must use wax, use non-skid floor wax.  Do not have throw rugs and other things on the floor that can make you trip. What can I do with my stairs?  Do not leave any items on the stairs.  Make sure that there are handrails on both sides of the stairs and use them. Fix handrails that are broken or loose. Make sure that handrails are as long as the stairways.  Check any carpeting to make sure that it is firmly attached to the stairs. Fix any carpet that is loose or worn.  Avoid having throw rugs at the top or bottom of the stairs. If you do have  throw rugs, attach them to the floor with carpet tape.  Make sure that you have a light switch at the top of the stairs and the bottom of the stairs. If you do not have them, ask someone to add them for you. What else can I do to help prevent falls?  Wear shoes that:  Do not have high heels.  Have rubber bottoms.  Are comfortable and fit you well.  Are closed at the toe. Do not wear sandals.  If you use a stepladder:  Make sure that it is fully opened. Do not climb a closed stepladder.  Make sure that both sides of the stepladder are locked into place.  Ask someone to hold it for you, if possible.  Clearly mark and make sure that you can see:  Any grab bars or handrails.  First and last steps.  Where the edge of each step is.  Use tools that help you move around (mobility aids) if they are needed. These include:  Canes.  Walkers.  Scooters.  Crutches.  Turn on the lights when you go into a dark area. Replace any light bulbs as soon as they burn out.  Set up your furniture so you have a clear path. Avoid moving your furniture around.  If any of your floors are uneven, fix them.  If there are any pets around you, be aware of where they are.  Review your medicines with your doctor. Some medicines can make you feel dizzy. This can increase your chance of falling. Ask your doctor what other things that you can do to help prevent falls. This information is not intended to replace advice given to you by your health care provider. Make sure you discuss any questions you have with your health care provider. Document Released: 06/03/2009 Document Revised: 01/13/2016 Document Reviewed: 09/11/2014  2017 Elsevier  Health Maintenance, Male A healthy lifestyle and preventative care can promote health and wellness.  Maintain regular health, dental, and eye exams.  Eat a healthy diet. Foods like vegetables, fruits, whole grains, low-fat dairy products, and lean protein  foods contain the nutrients you need and are low in calories. Decrease your intake of foods high in solid fats, added sugars, and salt. Get information about a proper diet from your health care provider, if necessary.  Regular physical exercise is one of the most important things you can do for your health.  Most adults should get at least 150 minutes of moderate-intensity exercise (any activity that increases your heart rate and causes you to sweat) each week. In addition, most adults need muscle-strengthening exercises on 2 or more days a week.   Maintain a healthy weight. The body mass index (BMI) is a screening tool to identify possible weight problems. It provides an estimate of body fat based on height and weight. Your health care provider can find your BMI and can help you achieve or maintain a healthy weight. For males 20 years and older:  A BMI below 18.5 is considered underweight.  A BMI of 18.5 to 24.9 is normal.  A BMI of 25 to 29.9 is considered overweight.  A BMI of 30 and above is considered obese.  Maintain normal blood lipids and cholesterol by exercising and minimizing your intake of saturated fat. Eat a balanced diet with plenty of fruits and vegetables. Blood tests for lipids and cholesterol should begin at age 71 and be repeated every 5 years. If your lipid or cholesterol levels are high, you are over age 25, or you are at high risk for heart disease, you may need your cholesterol levels checked more frequently.Ongoing high lipid and cholesterol levels should be treated with medicines if diet and exercise are not working.  If you smoke, find out from your health care provider how to quit. If you do not use tobacco, do not start.  Lung cancer screening is recommended for adults aged 28-80 years who are at high risk for developing lung cancer because of a history of smoking. A yearly low-dose CT scan of the lungs is recommended for people who have at least a 30-pack-year history  of smoking and are current smokers or have quit within the past 15 years. A pack year of smoking is smoking an average of 1 pack of cigarettes a day for 1 year (for example, a 30-pack-year history of smoking could mean smoking 1 pack a day for 30 years or 2 packs a day for 15 years). Yearly screening should continue until the smoker has stopped smoking for at least 15 years. Yearly screening should be stopped for people who develop a health problem that would prevent them from having lung cancer treatment.  If you choose to drink alcohol, do not have more than 2 drinks per day. One drink is considered to be 12 oz (360 mL) of beer, 5 oz (150 mL) of wine, or 1.5 oz (45 mL) of liquor.  Avoid the use of street drugs. Do not share needles with anyone. Ask for help if you need support or instructions about stopping the use of drugs.  High blood pressure causes heart disease and increases the risk of stroke. High blood pressure is more likely to develop in:  People who have blood pressure in the end of the normal range (100-139/85-89 mm Hg).  People who are overweight or obese.  People who are African American.  If you are 48-84 years of age, have your blood pressure checked every 3-5 years. If you are 36 years of age or older, have your blood pressure checked every year. You should have your blood pressure measured twice-once when you are at a hospital or clinic, and once when you are not at a hospital or clinic. Record the average of the two measurements. To check your blood pressure when you are not at a hospital or clinic, you can use:  An automated blood pressure machine at a pharmacy.  A  home blood pressure monitor.  If you are 4-72 years old, ask your health care provider if you should take aspirin to prevent heart disease.  Diabetes screening involves taking a blood sample to check your fasting blood sugar level. This should be done once every 3 years after age 58 if you are at a normal  weight and without risk factors for diabetes. Testing should be considered at a younger age or be carried out more frequently if you are overweight and have at least 1 risk factor for diabetes.  Colorectal cancer can be detected and often prevented. Most routine colorectal cancer screening begins at the age of 73 and continues through age 84. However, your health care provider may recommend screening at an earlier age if you have risk factors for colon cancer. On a yearly basis, your health care provider may provide home test kits to check for hidden blood in the stool. A small camera at the end of a tube may be used to directly examine the colon (sigmoidoscopy or colonoscopy) to detect the earliest forms of colorectal cancer. Talk to your health care provider about this at age 72 when routine screening begins. A direct exam of the colon should be repeated every 5-10 years through age 29, unless early forms of precancerous polyps or small growths are found.  People who are at an increased risk for hepatitis B should be screened for this virus. You are considered at high risk for hepatitis B if:  You were born in a country where hepatitis B occurs often. Talk with your health care provider about which countries are considered high risk.  Your parents were born in a high-risk country and you have not received a shot to protect against hepatitis B (hepatitis B vaccine).  You have HIV or AIDS.  You use needles to inject street drugs.  You live with, or have sex with, someone who has hepatitis B.  You are a man who has sex with other men (MSM).  You get hemodialysis treatment.  You take certain medicines for conditions like cancer, organ transplantation, and autoimmune conditions.  Hepatitis C blood testing is recommended for all people born from [birth year] through 1967 and any individual with known risk factors for hepatitis C.  Healthy men should no longer receive prostate-specific antigen (PSA) blood  tests as part of routine cancer screening. Talk to your health care provider about prostate cancer screening.  Testicular cancer screening is not recommended for adolescents or adult males who have no symptoms. Screening includes self-exam, a health care provider exam, and other screening tests. Consult with your health care provider about any symptoms you have or any concerns you have about testicular cancer.  Practice safe sex. Use condoms and avoid high-risk sexual practices to reduce the spread of sexually transmitted infections (STIs).  You should be screened for STIs, including gonorrhea and chlamydia if:  You are sexually active and are younger than 24 years.  You are older than 24 years, and your health care provider tells you that you are at risk for this type of infection.  Your sexual activity has changed since you were last screened, and you are at an increased risk for chlamydia or gonorrhea. Ask your health care provider if you are at risk.  If you are at risk of being infected with HIV, it is recommended that you take a prescription medicine daily to prevent HIV infection. This is called pre-exposure prophylaxis (PrEP). You are considered at risk if:  You  are a man who has sex with other men (MSM).  You are a heterosexual man who is sexually active with multiple partners.  You take drugs by injection.  You are sexually active with a partner who has HIV.  Talk with your health care provider about whether you are at high risk of being infected with HIV. If you choose to begin PrEP, you should first be tested for HIV. You should then be tested every 3 months for as long as you are taking PrEP.  Use sunscreen. Apply sunscreen liberally and repeatedly throughout the day. You should seek shade when your shadow is shorter than you. Protect yourself by wearing long sleeves, pants, a wide-brimmed hat, and sunglasses year round whenever you are outdoors.  Tell your health care  provider of new moles or changes in moles, especially if there is a change in shape or color. Also, tell your health care provider if a mole is larger than the size of a pencil eraser.  A one-time screening for abdominal aortic aneurysm (AAA) and surgical repair of large AAAs by ultrasound is recommended for men aged 49-75 years who are current or former smokers.  Stay current with your vaccines (immunizations). This information is not intended to replace advice given to you by your health care provider. Make sure you discuss any questions you have with your health care provider. Document Released: 02/03/2008 Document Revised: 08/28/2014 Document Reviewed: 05/11/2015 Elsevier Interactive Patient Education  2017 [employer].
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
## 2016-09-22 ENCOUNTER — Other Ambulatory Visit: Payer: Self-pay | Admitting: Family Medicine

## 2016-10-03 ENCOUNTER — Other Ambulatory Visit: Payer: Self-pay | Admitting: Family Medicine

## 2016-10-16 ENCOUNTER — Other Ambulatory Visit: Payer: Self-pay | Admitting: Family Medicine

## 2016-10-24 ENCOUNTER — Ambulatory Visit (INDEPENDENT_AMBULATORY_CARE_PROVIDER_SITE_OTHER): Payer: Medicare Other | Admitting: Family Medicine

## 2016-10-24 ENCOUNTER — Encounter: Payer: Self-pay | Admitting: Family Medicine

## 2016-10-24 VITALS — BP 102/58 | HR 72 | Temp 98.2°F | Ht 75.0 in | Wt 178.4 lb

## 2016-10-24 DIAGNOSIS — Z720 Tobacco use: Secondary | ICD-10-CM

## 2016-10-24 DIAGNOSIS — E119 Type 2 diabetes mellitus without complications: Secondary | ICD-10-CM

## 2016-10-24 DIAGNOSIS — I10 Essential (primary) hypertension: Secondary | ICD-10-CM

## 2016-10-24 DIAGNOSIS — E538 Deficiency of other specified B group vitamins: Secondary | ICD-10-CM

## 2016-10-24 DIAGNOSIS — E785 Hyperlipidemia, unspecified: Secondary | ICD-10-CM | POA: Diagnosis not present

## 2016-10-24 DIAGNOSIS — Z86010 Personal history of colonic polyps: Secondary | ICD-10-CM

## 2016-10-24 MED ORDER — TADALAFIL 10 MG PO TABS: 10.0000 mg | ORAL_TABLET | Freq: Every day | ORAL | 3 refills | Status: DC | PRN

## 2016-10-24 NOTE — Progress Notes (Addendum)
Subjective:  Richard Navarro is a 71 y.o. year old very pleasant male patient who presents for/with See problem oriented charting ROS- No chest pain or shortness of breath. No headache or blurry vision. No hypoglycemia.    Past Medical History-  Patient Active Problem List   Diagnosis Date Noted  . PTSD (post-traumatic stress disorder) 12/07/2015    Priority: High  . Tobacco abuse 03/26/2014    Priority: High  . Diabetes mellitus without complication ([place])     Priority: High  . Erectile dysfunction 04/09/2014    Priority: Medium  . Hypertension     Priority: Medium  . Gout     Priority: Medium  . Depression     Priority: Medium  . Hyperlipidemia     Priority: Medium  . CKD (chronic kidney disease), stage II 04/17/2014    Priority: Low  . Prostate cancer screening 04/17/2014    Priority: Low  . Arthritis 03/26/2014    Priority: Low  . Personal history of colonic polyps 03/26/2014    Priority: Low  . GERD (gastroesophageal reflux disease)     Priority: Low    Medications- reviewed and updated Current Outpatient Prescriptions  Medication Sig Dispense Refill  . allopurinol (ZYLOPRIM) 100 MG tablet TAKE 1 TABLET DAILY 90 tablet 3  . aspirin 81 MG tablet Take 1 tablet (81 mg total) by mouth daily. 90 tablet 3  . lisinopril-hydrochlorothiazide (PRINZIDE,ZESTORETIC) 20-12.5 MG tablet TAKE 2 TABLETS DAILY 180 tablet 3  . lovastatin (MEVACOR) 20 MG tablet TAKE 1 TABLET TWO TO THREE TIMES A WEEK 36 tablet 3  . omeprazole (PRILOSEC) 20 MG capsule TAKE 1 CAPSULE DAILY 90 capsule 3  . tadalafil (CIALIS) 10 MG tablet Take 1 tablet (10 mg total) by mouth daily as needed for erectile dysfunction (every 72 hours maximum). 10 tablet 3  . VOLTAREN 1 % GEL APPLY 2 GRAMS TOPICALLY FOUR TIMES A DAY AS NEEDED FOR LEFT SHOULDER ARTHRITIS 300 g 3   No current facility-administered medications for this visit.     Objective: BP (!) 102/58 (BP Location: Left Arm, Patient Position: Sitting,  Cuff Size: Large)   Pulse 72   Temp 98.2 F (36.8 C) (Oral)   Ht '6\' 3"'$  (1.905 m)   Wt 178 lb 6.4 oz (80.9 kg)   SpO2 98%   BMI 22.30 kg/m  Gen: NAD, resting comfortably CV: RRR no murmurs rubs or gallops Lungs: CTAB no crackles, wheeze, rhonchi Ext: no edema Skin: warm, dry, no rash Neuro: grossly normal, moves all extremities  Diabetic Foot Exam - Simple   Simple Foot Form Diabetic Foot exam was performed with the following findings:  Yes 10/24/2016  3:23 PM  Visual Inspection No deformities, no ulcerations, no other skin breakdown bilaterally:  Yes Sensation Testing Intact to touch and monofilament testing bilaterally:  Yes Pulse Check Posterior Tibialis and Dorsalis pulse intact bilaterally:  Yes Comments     Assessment/Plan:  Also update b12- is taking 1000 mcg in b12  Hypertension S: controlled on lisinopril 40-'25mg'$ . (2 of the Lisinopril 20-12.5 hctz ) BP Readings from Last 3 Encounters:  10/24/16 (!) 102/58  09/21/16 132/80  05/02/16 (!) 159/83  A/P:Continue current meds:  No lightheadedness with regimen despite BP being lower  Hyperlipidemia S: lovastatin once a week- myalgias on all other options than this. Poor control but betst we can do. Even on twice a week had issues but LDL did get down to 99.  A/P: will continue current regimen, update direct  LDL  Diabetes mellitus without complication S: well controlled with no medications Lab Results  Component Value Date   HGBA1C 6.1 04/25/2016  A/P: update a1c today  Tobacco abuse S: still smoking- advised to quit. Has been cigarette free for 2 days. Thinking about stopping A/P:  Wants to quit on his own. Has quit for 6 years in past.  referred to lung cancer screening 04/25/16. Visit 06/12/16- not clear what happened.   History of adenomatous polyp of [facility] show last colonoscopy on 05/24/2009 interestingly by Dr. Zenovia Jarred. No pathology included but 3 polyps noted.  Records showe adenoma in 2002. No notes on planned repeat. Given it has been almost 8 years and family history of colon cancer in mother - we will refer back to [facility] for colonoscopy- records to be scanned in.     Return in about 6 months (around 04/26/2017) for follow up- or sooner if needed.  Orders Placed This Encounter  Procedures  . Hemoglobin A1c    [place]  . LDL cholesterol, direct    [place]  . Basic metabolic panel    [place]  . Vitamin B12  . Ambulatory Referral for Lung Cancer Scre    Referral Priority:   Routine    Referral Type:   Consultation    Referral Reason:   Specialty Services Required    Number of Visits Requested:   1  . Ambulatory referral to Gastroenterology    Referral Priority:   Routine    Referral Type:   Consultation    Referral Reason:   Specialty Services Required    Number of Visits Requested:   1    Meds ordered this encounter  Medications  . tadalafil (CIALIS) 10 MG tablet    Sig: Take 1 tablet (10 mg total) by mouth daily as needed for erectile dysfunction (every 72 hours maximum).    Dispense:  10 tablet    Refill:  3    Return precautions advised.  [name], MD

## 2016-10-24 NOTE — Addendum Note (Signed)
Addended by: [name] on: 10/24/2016 06:06 PM   Modules accepted: Orders

## 2016-10-24 NOTE — Assessment & Plan Note (Signed)
[place] records brought in by patient today show last colonoscopy on 05/24/2009 interestingly by Dr. Zenovia Jarred. No pathology included but 3 polyps noted. Records showe adenoma in 2002. No notes on planned repeat. Given it has been almost 8 years and family history of colon cancer in mother - we will refer back to [facility] for colonoscopy- records to be scanned in.

## 2016-10-24 NOTE — Assessment & Plan Note (Signed)
S: controlled on lisinopril 40-'25mg'$ . (2 of the Lisinopril 20-12.5 hctz ) BP Readings from Last 3 Encounters:  10/24/16 (!) 102/58  09/21/16 132/80  05/02/16 (!) 159/83  A/P:Continue current meds:  No lightheadedness with regimen despite BP being lower

## 2016-10-24 NOTE — Assessment & Plan Note (Addendum)
S: lovastatin once a week- myalgias on all other options than this. Poor control but betst we can do. Even on twice a week had issues but LDL did get down to 99.  A/P: will continue current regimen, update direct LDL

## 2016-10-24 NOTE — Patient Instructions (Addendum)
Please stop by lab before you go  Please get your eye exam done and have them send [place] a copy  No changes in medicine today. Keep taking lovastatin once a week- if you can tolerate twice a week even better but have not been able to in past.   Blood pressure lower for you today- let us know if any lightheadedness from BP running lower and may need to reduce regimen

## 2016-10-24 NOTE — Assessment & Plan Note (Signed)
S: well controlled with no medications Lab Results  Component Value Date   HGBA1C 6.1 04/25/2016  A/P: update a1c today

## 2016-10-24 NOTE — Progress Notes (Signed)
Pre visit review using our clinic review tool, if applicable. No additional management support is needed unless otherwise documented below in the visit note. 

## 2016-10-24 NOTE — Assessment & Plan Note (Signed)
S: still smoking- advised to quit. Has been cigarette free for 2 days. Thinking about stopping A/P:  Wants to quit on his own. Has quit for 6 years in past.  referred to lung cancer screening 04/25/16. Visit 06/12/16- not clear what happened.

## 2016-10-25 ENCOUNTER — Encounter: Payer: Self-pay | Admitting: Family Medicine

## 2016-10-25 LAB — HEMOGLOBIN A1C: Hgb A1c MFr Bld: 6.6 % — ABNORMAL HIGH (ref 4.6–6.5)

## 2016-10-25 LAB — VITAMIN B12: Vitamin B-12: 771 pg/mL (ref 211–911)

## 2016-10-25 LAB — BASIC METABOLIC PANEL
BUN: 25 mg/dL — AB (ref 6–23)
CHLORIDE: 98 meq/L (ref 96–112)
CO2: 29 meq/L (ref 19–32)
Calcium: 8.9 mg/dL (ref 8.4–10.5)
Creatinine, Ser: 1.36 mg/dL (ref 0.40–1.50)
GFR: 66.13 mL/min (ref >60.00)
GLUCOSE: 112 mg/dL — AB (ref 70–99)
POTASSIUM: 3.7 meq/L (ref 3.5–5.1)
Sodium: 135 mEq/L (ref 135–145)

## 2016-10-25 LAB — LDL CHOLESTEROL, DIRECT: Direct LDL: 129 mg/dL

## 2016-11-15 ENCOUNTER — Encounter: Payer: Self-pay | Admitting: Acute Care

## 2016-11-15 ENCOUNTER — Ambulatory Visit (INDEPENDENT_AMBULATORY_CARE_PROVIDER_SITE_OTHER): Payer: Medicare Other | Admitting: Acute Care

## 2016-11-15 ENCOUNTER — Ambulatory Visit (INDEPENDENT_AMBULATORY_CARE_PROVIDER_SITE_OTHER)
Admission: RE | Admit: 2016-11-15 | Discharge: 2016-11-15 | Disposition: A | Discharge status: Home / Self Care | Payer: Medicare Other | Source: Ambulatory Visit | Attending: Acute Care | Admitting: Acute Care

## 2016-11-15 DIAGNOSIS — F17210 Nicotine dependence, cigarettes, uncomplicated: Secondary | ICD-10-CM | POA: Diagnosis not present

## 2016-11-15 DIAGNOSIS — Z87891 Personal history of nicotine dependence: Secondary | ICD-10-CM | POA: Diagnosis not present

## 2016-11-15 NOTE — Progress Notes (Signed)
Shared Decision Making Visit Lung Cancer Screening Program [phone])   Eligibility:  Age 71 y.o.  Pack Years Smoking History Calculation 45 pack year smoking history (# packs/per year x # years smoked)  Recent History of coughing up blood  No  Unexplained weight loss? no ( >Than 15 pounds within the last 6 months )  Prior History Lung / other cancer no (Diagnosis within the last 5 years already requiring surveillance chest CT Scans).  Smoking Status Current Smoker  Former Smokers: Years since quit:NA  Quit Date: NA  Visit Components:  Discussion included one or more decision making aids. yes  Discussion included risk/benefits of screening. yes  Discussion included potential follow up diagnostic testing for abnormal scans. yes  Discussion included meaning and risk of over diagnosis. yes  Discussion included meaning and risk of False Positives. yes  Discussion included meaning of total radiation exposure. yes  Counseling Included:  Importance of adherence to annual lung cancer LDCT screening. yes  Impact of comorbidities on ability to participate in the program. yes  Ability and willingness to under diagnostic treatment. yes  Smoking Cessation Counseling:  Current Smokers:   Discussed importance of smoking cessation. yes  Information about tobacco cessation classes and interventions provided to patient. yes  Patient provided with "ticket" for LDCT Scan. yes  Symptomatic Patient. no  Counseling NA  Diagnosis Code: Tobacco Use Z72.0  Asymptomatic Patient yes  Counseling (Intermediate counseling: > three minutes counseling) O7564  Former Smokers:   Discussed the importance of maintaining cigarette abstinence. yes  Diagnosis Code: Personal History of Nicotine Dependence. P32.951  Information about tobacco cessation classes and interventions provided to patient. Yes  Patient provided with "ticket" for LDCT Scan. yes  Written Order for Lung Cancer  Screening with LDCT placed in Epic. Yes (CT Chest Lung Cancer Screening Low Dose W/O CM) [id number] Z12.2-Screening of respiratory organs Z87.891-Personal history of nicotine dependence  I have spent 25 minutes of face to face time with Mr. Springborn discussing the risks and benefits of lung cancer screening. We viewed a power point together that explained in detail the above noted topics. We paused at intervals to allow for questions to be asked and answered to ensure understanding.We discussed that the single most powerful action that he can take to decrease his risk of developing lung cancer is to quit smoking. We discussed whether or not he is ready to commit to setting a quit date. He is not ready to set a quit date.We discussed options for tools to aid in quitting smoking including nicotine replacement therapy, non-nicotine medications, support groups, Quit Smart classes, and behavior modification. We discussed that often times setting smaller, more achievable goals, such as eliminating 1 cigarette a day for a week and then 2 cigarettes a day for a week can be helpful in slowly decreasing the number of cigarettes smoked. This allows for a sense of accomplishment as well as providing a clinical benefit. I gave him  the " Be Stronger Than Your Excuses" card with contact information for community resources, classes, free nicotine replacement therapy, and access to mobile apps, text messaging, and on-line smoking cessation help. I have also given him my card and contact information in the event he needs to contact me. We discussed the time and location of the scan, and that either Doroteo Glassman, RN or I will call with the results within 24-48 hours of receiving them. I have provided him with a copy of the power point we viewed  as  a resource in the event they need reinforcement of the concepts we discussed today in the office. The patient verbalized understanding of all of  the above and had no further questions  upon leaving the office. They have my contact information in the event they have any further questions.  I spent 4 minutes of today's appointment discussing smoking cessation and the health risks of continued tobacco abuse.  I have explained to the patient that there has been a high incidence of CAD noted on these scans. I explained that this is a non-gated exam and therefore degree or severity of disease cannot be determined. I explained that we will fax a copy of the results to the his PCP for follow up. He verbalized understanding of the above. Mr. Voorhis is on statin therapy per his PCP.  Magdalen Spatz, NP 11/15/2016

## 2016-11-16 ENCOUNTER — Telehealth: Payer: Self-pay | Admitting: Acute Care

## 2016-11-16 NOTE — Telephone Encounter (Signed)
ATC pt. Line continued to ring without option for VM. WCB.

## 2016-11-20 ENCOUNTER — Other Ambulatory Visit: Payer: Self-pay | Admitting: Acute Care

## 2016-11-20 DIAGNOSIS — R9389 Abnormal findings on diagnostic imaging of other specified body structures: Secondary | ICD-10-CM

## 2016-11-20 NOTE — Telephone Encounter (Signed)
I have called Richard Navarro with the results of his low dose screening CT . I explained that his scan was abnormal. We discussed that his scan was read as a Lung RADS 4 B indicates suspicious findings for which additional diagnostic testing and or tissue sampling is recommended.I explained that the next step is to order a PET scan to evaluate best referral for diagnosis and treatment.I   I explained that that someone will call him today to schedule the scan. I also told Richard Navarro that  I will let Dr. Yong Channel know, and I will fax him a copy of the scan. Richard Navarro verbalized understanding and had no further questions. The scan has been ordered.

## 2016-11-23 ENCOUNTER — Encounter (HOSPITAL_COMMUNITY)
Admission: RE | Admit: 2016-11-23 | Discharge: 2016-11-23 | Disposition: A | Discharge status: Home / Self Care | Payer: Medicare Other | Source: Ambulatory Visit | Attending: Acute Care | Admitting: Acute Care

## 2016-11-23 DIAGNOSIS — R938 Abnormal findings on diagnostic imaging of other specified body structures: Secondary | ICD-10-CM | POA: Diagnosis not present

## 2016-11-23 DIAGNOSIS — R918 Other nonspecific abnormal finding of lung field: Secondary | ICD-10-CM | POA: Diagnosis not present

## 2016-11-23 DIAGNOSIS — R9389 Abnormal findings on diagnostic imaging of other specified body structures: Secondary | ICD-10-CM

## 2016-11-23 LAB — GLUCOSE, CAPILLARY: GLUCOSE-CAPILLARY: 122 mg/dL — AB (ref 65–99)

## 2016-11-23 MED ORDER — FLUDEOXYGLUCOSE F - 18 (FDG) INJECTION: 9.0000 | Freq: Once | INTRAVENOUS | Status: DC | PRN

## 2016-11-28 ENCOUNTER — Telehealth: Payer: Self-pay | Admitting: Acute Care

## 2016-11-28 NOTE — Telephone Encounter (Signed)
SG  Please Advise-  Pt's wife called and stated her and the pt wanted to know results of his PET scan.

## 2016-11-28 NOTE — Telephone Encounter (Signed)
I have called Mr. Adama with the results of his PET scan. I explained that we will need to schedule him for office visit to schedule a biopsy of the RLL lung nodule. I told him that as the office currently closed I will have someone call him in the morning and get that appointment scheduled. Mr. Baty verbalized understanding and had no further questions. He will await a call from the office. He does have the office contact information in the event he has questions prior to being called.

## 2016-12-01 ENCOUNTER — Ambulatory Visit (INDEPENDENT_AMBULATORY_CARE_PROVIDER_SITE_OTHER): Payer: Medicare Other | Admitting: Pulmonary Disease

## 2016-12-01 ENCOUNTER — Other Ambulatory Visit (INDEPENDENT_AMBULATORY_CARE_PROVIDER_SITE_OTHER): Payer: Medicare Other

## 2016-12-01 ENCOUNTER — Encounter: Payer: Self-pay | Admitting: Pulmonary Disease

## 2016-12-01 DIAGNOSIS — J439 Emphysema, unspecified: Secondary | ICD-10-CM

## 2016-12-01 DIAGNOSIS — R590 Localized enlarged lymph nodes: Secondary | ICD-10-CM | POA: Insufficient documentation

## 2016-12-01 DIAGNOSIS — R918 Other nonspecific abnormal finding of lung field: Secondary | ICD-10-CM | POA: Diagnosis not present

## 2016-12-01 DIAGNOSIS — J449 Chronic obstructive pulmonary disease, unspecified: Secondary | ICD-10-CM | POA: Insufficient documentation

## 2016-12-01 LAB — CBC WITH DIFFERENTIAL/PLATELET
BASOS ABS: 0.1 10*3/uL (ref 0.0–0.1)
Basophils Relative: 1.2 % (ref 0.0–3.0)
EOS ABS: 0.1 10*3/uL (ref 0.0–0.7)
Eosinophils Relative: 0.8 % (ref 0.0–5.0)
HCT: 44.5 % (ref 39.0–52.0)
Hemoglobin: 15 g/dL (ref 13.0–17.0)
LYMPHS ABS: 2.5 10*3/uL (ref 0.7–4.0)
Lymphocytes Relative: 39 % (ref 12.0–46.0)
MCHC: 33.6 g/dL (ref 30.0–36.0)
MCV: 87.8 fl (ref 78.0–100.0)
MONO ABS: 0.6 10*3/uL (ref 0.1–1.0)
Monocytes Relative: 9 % (ref 3.0–12.0)
NEUTROS PCT: 50 % (ref 43.0–77.0)
Neutro Abs: 3.2 10*3/uL (ref 1.4–7.7)
Platelets: 250 10*3/uL (ref 150.0–400.0)
RBC: 5.07 Mil/uL (ref 4.22–5.81)
RDW: 14.1 % (ref 11.5–15.5)
WBC: 6.3 10*3/uL (ref 4.0–10.5)

## 2016-12-01 NOTE — Progress Notes (Signed)
Pt was informed about the possibility of having his PFT scheduled at [facility] in the event we could not work him into our PFT room. Pt was informed it may be more expensive going to [facility] Pt stated "no problem" and verbalized willingness to go to [facility] if need be.

## 2016-12-01 NOTE — Patient Instructions (Addendum)
   Call me if you have any new breathing problems or questions.   We will schedule your bronchoscopy for the week of 4/23 and it will be early in the morning.  TESTS ORDERED: 1. Serum CBC & Alpha-1 Antitrypsin Phenotype 2. Full PFTs ASAP

## 2016-12-01 NOTE — Addendum Note (Signed)
Addended by: Tyson Dense on: 12/01/2016 09:29 AM   Modules accepted: Orders
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
## 2016-12-04 NOTE — Telephone Encounter (Signed)
Result Notes   Notes recorded by Magdalen Spatz, NP on 11/28/2016 at 6:07 PM EDT These results have been called to the patient verbalized understanding. The patient will be scheduled to see a pulmonologist to determine best options for tissue sampling of right lower lobe nodule. The patient verbalized understanding of the plan of care.

## 2016-12-05 ENCOUNTER — Ambulatory Visit (INDEPENDENT_AMBULATORY_CARE_PROVIDER_SITE_OTHER): Payer: Medicare Other | Admitting: Pulmonary Disease

## 2016-12-05 DIAGNOSIS — J439 Emphysema, unspecified: Secondary | ICD-10-CM | POA: Diagnosis not present

## 2016-12-05 NOTE — Progress Notes (Signed)
PFT done today. Katie Welchel,CMA  

## 2016-12-06 LAB — ALPHA-1 ANTITRYPSIN PHENOTYPE: A-1 Antitrypsin: 130 mg/dL (ref 83–199)

## 2016-12-06 NOTE — Telephone Encounter (Signed)
Per SG pt aware and voiced his understanding. Nothing further needed.

## 2016-12-12 ENCOUNTER — Encounter (HOSPITAL_COMMUNITY): Payer: Self-pay | Admitting: *Deleted

## 2016-12-13 ENCOUNTER — Ambulatory Visit (HOSPITAL_COMMUNITY)
Admission: RE | Admit: 2016-12-13 | Discharge: 2016-12-13 | Disposition: A | Discharge status: Home / Self Care | Payer: Medicare Other | Source: Ambulatory Visit | Attending: Pulmonary Disease | Admitting: Pulmonary Disease

## 2016-12-13 ENCOUNTER — Encounter (HOSPITAL_COMMUNITY)
Admission: RE | Disposition: A | Discharge status: Home / Self Care | Payer: Self-pay | Source: Ambulatory Visit | Attending: Pulmonary Disease

## 2016-12-13 ENCOUNTER — Ambulatory Visit (HOSPITAL_COMMUNITY): Payer: Medicare Other

## 2016-12-13 ENCOUNTER — Ambulatory Visit: Admit: 2016-12-13 | Payer: Medicare Other | Admitting: Pulmonary Disease

## 2016-12-13 ENCOUNTER — Encounter: Payer: Self-pay | Admitting: Pulmonary Disease

## 2016-12-13 ENCOUNTER — Ambulatory Visit (HOSPITAL_COMMUNITY): Payer: Medicare Other | Admitting: Anesthesiology

## 2016-12-13 ENCOUNTER — Encounter (HOSPITAL_COMMUNITY): Payer: Self-pay | Admitting: *Deleted

## 2016-12-13 DIAGNOSIS — R590 Localized enlarged lymph nodes: Secondary | ICD-10-CM | POA: Diagnosis present

## 2016-12-13 DIAGNOSIS — I10 Essential (primary) hypertension: Secondary | ICD-10-CM | POA: Insufficient documentation

## 2016-12-13 DIAGNOSIS — E785 Hyperlipidemia, unspecified: Secondary | ICD-10-CM | POA: Insufficient documentation

## 2016-12-13 DIAGNOSIS — R918 Other nonspecific abnormal finding of lung field: Secondary | ICD-10-CM | POA: Diagnosis not present

## 2016-12-13 DIAGNOSIS — D4989 Neoplasm of unspecified behavior of other specified sites: Secondary | ICD-10-CM | POA: Insufficient documentation

## 2016-12-13 DIAGNOSIS — M109 Gout, unspecified: Secondary | ICD-10-CM | POA: Diagnosis not present

## 2016-12-13 DIAGNOSIS — Z79899 Other long term (current) drug therapy: Secondary | ICD-10-CM | POA: Insufficient documentation

## 2016-12-13 DIAGNOSIS — Z9889 Other specified postprocedural states: Secondary | ICD-10-CM

## 2016-12-13 DIAGNOSIS — E119 Type 2 diabetes mellitus without complications: Secondary | ICD-10-CM | POA: Insufficient documentation

## 2016-12-13 DIAGNOSIS — J9811 Atelectasis: Secondary | ICD-10-CM | POA: Diagnosis not present

## 2016-12-13 DIAGNOSIS — Z87891 Personal history of nicotine dependence: Secondary | ICD-10-CM | POA: Insufficient documentation

## 2016-12-13 DIAGNOSIS — Z7982 Long term (current) use of aspirin: Secondary | ICD-10-CM | POA: Diagnosis not present

## 2016-12-13 DIAGNOSIS — J439 Emphysema, unspecified: Secondary | ICD-10-CM | POA: Insufficient documentation

## 2016-12-13 DIAGNOSIS — K219 Gastro-esophageal reflux disease without esophagitis: Secondary | ICD-10-CM | POA: Insufficient documentation

## 2016-12-13 DIAGNOSIS — J449 Chronic obstructive pulmonary disease, unspecified: Secondary | ICD-10-CM | POA: Diagnosis not present

## 2016-12-13 HISTORY — DX: Headache, unspecified: R51.9

## 2016-12-13 HISTORY — DX: Headache: R51

## 2016-12-13 HISTORY — DX: Chronic kidney disease, unspecified: N18.9

## 2016-12-13 HISTORY — DX: Heatstroke and sunstroke, initial encounter: T67.01XA

## 2016-12-13 HISTORY — PX: ENDOBRONCHIAL ULTRASOUND: SHX5096

## 2016-12-13 LAB — GLUCOSE, CAPILLARY: GLUCOSE-CAPILLARY: 106 mg/dL — AB (ref 65–99)

## 2016-12-13 SURGERY — ENDOBRONCHIAL ULTRASOUND (EBUS)
Anesthesia: General | Laterality: Bilateral

## 2016-12-13 SURGERY — BRONCHOSCOPY, WITH EBUS
Anesthesia: General

## 2016-12-13 MED ORDER — PROPOFOL 10 MG/ML IV BOLUS
INTRAVENOUS | Status: DC | PRN
  Administered 2016-12-13: 180 mg via INTRAVENOUS

## 2016-12-13 MED ORDER — DEXAMETHASONE SODIUM PHOSPHATE 10 MG/ML IJ SOLN
INTRAMUSCULAR | Status: DC | PRN
  Administered 2016-12-13: 10 mg via INTRAVENOUS

## 2016-12-13 MED ORDER — PHENYLEPHRINE 40 MCG/ML (10ML) SYRINGE FOR IV PUSH (FOR BLOOD PRESSURE SUPPORT)
PREFILLED_SYRINGE | INTRAVENOUS | Status: AC
  Filled 2016-12-13: qty 10

## 2016-12-13 MED ORDER — SUGAMMADEX SODIUM 200 MG/2ML IV SOLN
INTRAVENOUS | Status: AC
  Filled 2016-12-13: qty 2

## 2016-12-13 MED ORDER — FENTANYL CITRATE (PF) 100 MCG/2ML IJ SOLN
INTRAMUSCULAR | Status: AC
  Filled 2016-12-13: qty 2

## 2016-12-13 MED ORDER — LACTATED RINGERS IV SOLN
INTRAVENOUS | Status: DC
  Administered 2016-12-13: 1000 mL via INTRAVENOUS

## 2016-12-13 MED ORDER — PROPOFOL 10 MG/ML IV BOLUS
INTRAVENOUS | Status: AC
  Filled 2016-12-13: qty 20

## 2016-12-13 MED ORDER — LIDOCAINE 2% (20 MG/ML) 5 ML SYRINGE
INTRAMUSCULAR | Status: DC | PRN
  Administered 2016-12-13: 100 mg via INTRAVENOUS

## 2016-12-13 MED ORDER — ROCURONIUM BROMIDE 50 MG/5ML IV SOSY
PREFILLED_SYRINGE | INTRAVENOUS | Status: AC
  Filled 2016-12-13: qty 5

## 2016-12-13 MED ORDER — PHENYLEPHRINE 40 MCG/ML (10ML) SYRINGE FOR IV PUSH (FOR BLOOD PRESSURE SUPPORT)
PREFILLED_SYRINGE | INTRAVENOUS | Status: DC | PRN
  Administered 2016-12-13: 80 ug via INTRAVENOUS

## 2016-12-13 MED ORDER — ONDANSETRON HCL 4 MG/2ML IJ SOLN
INTRAMUSCULAR | Status: AC
  Filled 2016-12-13: qty 2

## 2016-12-13 MED ORDER — FENTANYL CITRATE (PF) 100 MCG/2ML IJ SOLN
INTRAMUSCULAR | Status: DC | PRN
  Administered 2016-12-13 (×2): 50 ug via INTRAVENOUS

## 2016-12-13 MED ORDER — LIDOCAINE 2% (20 MG/ML) 5 ML SYRINGE
INTRAMUSCULAR | Status: AC
  Filled 2016-12-13: qty 5

## 2016-12-13 MED ORDER — SUCCINYLCHOLINE CHLORIDE 200 MG/10ML IV SOSY
PREFILLED_SYRINGE | INTRAVENOUS | Status: AC
  Filled 2016-12-13: qty 10

## 2016-12-13 MED ORDER — SUCCINYLCHOLINE CHLORIDE 200 MG/10ML IV SOSY
PREFILLED_SYRINGE | INTRAVENOUS | Status: DC | PRN
  Administered 2016-12-13: 14 mg via INTRAVENOUS

## 2016-12-13 MED ORDER — ONDANSETRON HCL 4 MG/2ML IJ SOLN
INTRAMUSCULAR | Status: DC | PRN
  Administered 2016-12-13: 4 mg via INTRAVENOUS

## 2016-12-13 MED ORDER — DEXAMETHASONE SODIUM PHOSPHATE 10 MG/ML IJ SOLN
INTRAMUSCULAR | Status: AC
  Filled 2016-12-13: qty 1

## 2016-12-13 MED ORDER — SUGAMMADEX SODIUM 200 MG/2ML IV SOLN
INTRAVENOUS | Status: DC | PRN
  Administered 2016-12-13: 200 mg via INTRAVENOUS

## 2016-12-13 MED ORDER — ROCURONIUM BROMIDE 10 MG/ML (PF) SYRINGE
PREFILLED_SYRINGE | INTRAVENOUS | Status: DC | PRN
  Administered 2016-12-13: 10 mg via INTRAVENOUS
  Administered 2016-12-13: 20 mg via INTRAVENOUS

## 2016-12-13 MED ORDER — SODIUM CHLORIDE 0.9 % IV SOLN
INTRAVENOUS | Status: DC | PRN
  Administered 2016-12-13: 09:00:00 via INTRAVENOUS

## 2016-12-13 NOTE — Anesthesia Postprocedure Evaluation (Addendum)
Anesthesia Post Note  Patient: Richard Navarro  Procedure(s) Performed: Procedure(s) (LRB): ENDOBRONCHIAL ULTRASOUND (Bilateral)  Patient location during evaluation: PACU Anesthesia Type: General Level of consciousness: awake and alert Pain management: pain level controlled Vital Signs Assessment: post-procedure vital signs reviewed and stable Respiratory status: spontaneous breathing, nonlabored ventilation, respiratory function stable and patient connected to nasal cannula oxygen Cardiovascular status: blood pressure returned to baseline and stable Postop Assessment: no signs of nausea or vomiting Anesthetic complications: no       Last Vitals:  Vitals:   12/13/16 0938 12/13/16 0940  BP: 128/60   Pulse: 78   Resp: 19   Temp:  36.1 C    Last Pain:  Vitals:   12/13/16 0940  TempSrc: Axillary                 Chevonne Bostrom S

## 2016-12-13 NOTE — Discharge Instructions (Signed)
Flexible Bronchoscopy, Care After These instructions give you information on caring for yourself after your procedure. Your doctor may also give you more specific instructions. Call your doctor if you have any problems or questions after your procedure. Follow these instructions at home:  Do not eat or drink anything for 2 hours after your procedure. If you try to eat or drink before the medicine wears off, food or drink could go into your lungs. You could also burn yourself.  After 2 hours have passed and when you can cough and gag normally, you may eat soft food and drink liquids slowly.  The day after the test, you may eat your normal diet.  You may do your normal activities.after 24 hours  Keep all doctor visits. Get help right away if:  You get more and more short of breath.  You get light-headed.  You feel like you are going to pass out (faint).  You have chest pain.  You have new problems that worry you.  You cough up more than a little blood.  You cough up more blood than before. This information is not intended to replace advice given to you by your health care provider. Make sure you discuss any questions you have with your health care provider. Document Released: 06/04/2009 Document Revised: 01/13/2016 Document Reviewed: 04/11/2013 Elsevier Interactive Patient Education  2017 [place] may cough up a small amount of bloody phlegm after the procedure. If you continue to cough up blood for the volume of blood increases seek immediate medical attention. Sick immediate medical attention for any chest discomfort or difficulty breathing. Contact my office if you develop any fever or cough producing a green, yellow, or otherwise discolored phlegm that could indicate an infection.

## 2016-12-13 NOTE — Op Note (Signed)
Video Bronchoscopy Procedure Note  Pre-Procedure Diagnoses: 1.  Right lower lobe lung mass  Post-Procedure Diagnoses: 1.  Right lower lobe lung mas.s  Procedures Performed: 1. Bronchoscopy with Airway Inspection 2. Endobronchial Ultrasound with Needle Aspiration  Consent:  Informed consent was obtained from the patient  after discussing the risks and benefits of the procedure including bleeding, infection, pneumothorax, medication allergy, vocal chord injury, and potentially death.  Sedation: Please refer to anesthesia documentation.  Pre-Procedure Physical Exam: General:  No acute distress. Awake. Alert. ASA Class 1. HEENT:  Moist mucus membranes. No oral ulcers. Mallampati Class 3. Cardiovascular:  Regular rate. No edema. No appreciable JVD. Pulmonary:  Clear to auscultation with good aeration bilaterally. Normal work of breathing. Abdomen:  Soft. Nontender. Nondistended. Normal bowel sounds. Musculoskeletal:  Normal bulk and tone. Normal neck flexion & extension. Neurological:  Oriented to person, place, and time. Moving all 4 extremities equally.  Description of Procedure: Patient was brought back to the endoscopy procedure room.  A time out was performed to identify the correct patient and procedure.  Patient was laid recumbent and sedation was administered by CRNA. Patient was endotracheally intubated.  Flexible bronchoscope was then inserted into theendotracheal tube and  into the proximal trachea. An airway inspeciton was performed finding a modest amount of bilateral airway dilatation as well as some false diverticuli. An endobronchial mass was found obstructing approximately 100% of the airway leading to the right lower lobe just distal to the entrance to the medial basilar segment of the right lower lobe.  The endobronchial mass appeared rounded with hypervascularity and some blush of submucosal bleeding. Therefore, a biopsy was not performed of the mass due to risk of bleeding.  The remainder of the airways were inspected and found to be without any endobronchial lesion or mass with normal mucosa.  The flexible bronchoscope was then removed from the patient's airways after suctioning of the remaining secretions.  The endobronchial ultrasound scope was then inserted into theendotracheal tube. Multiple lymph nodes were identified at level VII  with endobronchial ultrasound.  Under direct visualization with the ultrasound a total of 5 passes were performed at level VII.  Hemostasis was directly visualized. The bronchoscope was then repositioned to level XI R. 3 separate passes were performed at this dictation under direct ultrasound visualization.  The remaining secretions were suctioned from the patient's airways and the endobronchial ultrasound scope was removed.    Blood Loss:  Less than 10 cc.  Complications:  None.  EBUS Procedure: Level 4R:  0.5 cm stripe of lymph tissue / no biopsy performed  Level 4L:  No lymph node visualized Level 7:  2 cm lymph nodes / biopsy performed / ROSE / passes 1-3 for cytology & cell block / passes 4 & 5 for cell block only Level 11R:  1 cm lymph node / biopsy performed / passes 1 - 3 for cell block only  Post Procedure Stat Portable CXR:  Ordered and pending.  Post Procedure Instructions: Personally spoke with the patient's wife relaying the preliminary results of the procedure.  Instructed her that the patient is not to operate a car for 24 hours.  The patient should seek immediate medical attention if there is any persistent or progressive hemoptysis, difficulty breathing, or chest pain/discomfort.  The patient should also notify me immediately or seek medical attention for any purulent sputum production or fever occuring today or in the coming days.  The patient will be contacted by me once the final results of  the studies are available.  The patient should call my office if they have any questions.
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
## 2016-12-13 NOTE — Anesthesia Preprocedure Evaluation (Signed)
Anesthesia Evaluation  Patient identified by MRN, date of birth, ID band Patient awake    Reviewed: Allergy & Precautions, NPO status , Patient's Chart, lab work & pertinent test results  Airway Mallampati: II  TM Distance: >3 FB Neck ROM: Full    Dental no notable dental hx.    Pulmonary COPD, former smoker,    Pulmonary exam normal breath sounds clear to auscultation       Cardiovascular hypertension, Normal cardiovascular exam Rhythm:Regular Rate:Normal     Neuro/Psych negative neurological ROS  negative psych ROS   GI/Hepatic Neg liver ROS, GERD  Medicated,  Endo/Other  diabetes  Renal/GU negative Renal ROS  negative genitourinary   Musculoskeletal negative musculoskeletal ROS (+)   Abdominal   Peds negative pediatric ROS (+)  Hematology negative hematology ROS (+)   Anesthesia Other Findings   Reproductive/Obstetrics negative OB ROS                             Anesthesia Physical Anesthesia Plan  ASA: III  Anesthesia Plan: General   Post-op Pain Management:    Induction: Intravenous  Airway Management Planned: Oral ETT  Additional Equipment:   Intra-op Plan:   Post-operative Plan: Extubation in OR  Informed Consent: I have reviewed the patients History and Physical, chart, labs and discussed the procedure including the risks, benefits and alternatives for the proposed anesthesia with the patient or authorized representative who has indicated his/her understanding and acceptance.   Dental advisory given  Plan Discussed with: CRNA and Surgeon  Anesthesia Plan Comments:         Anesthesia Quick Evaluation

## 2016-12-13 NOTE — Transfer of Care (Signed)
Immediate Anesthesia Transfer of Care Note  Patient: Richard Navarro  Procedure(s) Performed: Procedure(s): ENDOBRONCHIAL ULTRASOUND (Bilateral)  Patient Location: PACU  Anesthesia Type:General  Level of Consciousness: sedated  Airway & Oxygen Therapy: Patient Spontanous Breathing and Patient connected to face mask oxygen  Post-op Assessment: Report given to RN and Post -op Vital signs reviewed and stable  Post vital signs: Reviewed and stable  Last Vitals:  Vitals:   12/13/16 0738  BP: (!) 151/82  Pulse: 60  Resp: (!) 22  Temp: 36.5 C    Last Pain:  Vitals:   12/13/16 0738  TempSrc: Oral         Complications: No apparent anesthesia complications

## 2016-12-13 NOTE — Interval H&P Note (Signed)
History and Physical Interval Note:  12/13/2016 8:19 AM  Richard Navarro  has presented today for bronchoscopy with biopsy with the diagnosis of Lung Mass  The various methods of treatment have been discussed with the patient and family. After consideration of risks, benefits and other options for treatment, the patient has consented to  Procedure(s): ENDOBRONCHIAL ULTRASOUND (Bilateral) as a surgical intervention .  The patient's history has been reviewed, patient examined, no change in status, stable for surgery.  I have reviewed the patient's chart and labs.  Questions were answered to the patient's satisfaction.    Sonia Baller Ashok Cordia, M.D. [facility] Pager:  [phone] After 3pm or if no response, call [phone] 8:19 AM 12/13/16

## 2016-12-13 NOTE — Anesthesia Procedure Notes (Signed)
Procedure Name: Intubation Date/Time: 12/13/2016 8:26 AM Performed by: Lind Covert Pre-anesthesia Checklist: Patient identified, Emergency Drugs available, Suction available, Patient being monitored and Timeout performed Patient Re-evaluated:Patient Re-evaluated prior to inductionOxygen Delivery Method: Circle system utilized Preoxygenation: Pre-oxygenation with 100% oxygen Intubation Type: IV induction Laryngoscope Size: Mac and 4 Grade View: Grade I Tube size: 9.0 mm Number of attempts: 1 Airway Equipment and Method: Stylet Placement Confirmation: ETT inserted through vocal cords under direct vision,  positive ETCO2 and breath sounds checked- equal and bilateral Secured at: 23 cm Tube secured with: Tape Dental Injury: Teeth and Oropharynx as per pre-operative assessment

## 2016-12-14 ENCOUNTER — Encounter (HOSPITAL_COMMUNITY): Payer: Self-pay | Admitting: Pulmonary Disease

## 2016-12-18 ENCOUNTER — Other Ambulatory Visit: Payer: Self-pay | Admitting: Pulmonary Disease

## 2016-12-18 DIAGNOSIS — C7A8 Other malignant neuroendocrine tumors: Secondary | ICD-10-CM

## 2016-12-18 DIAGNOSIS — R918 Other nonspecific abnormal finding of lung field: Secondary | ICD-10-CM

## 2016-12-18 NOTE — Progress Notes (Signed)
Spoke with patient and informed him of labs needed to be drawn; he is coming in tomorrow to have them collected. He was also informed of CT needing to be performed. He verbalized understanding. Orders placed for labs, CT, and thoracic oncology referral. Nothing further is needed.

## 2016-12-19 ENCOUNTER — Ambulatory Visit (INDEPENDENT_AMBULATORY_CARE_PROVIDER_SITE_OTHER)
Admission: RE | Admit: 2016-12-19 | Discharge: 2016-12-19 | Disposition: A | Discharge status: Home / Self Care | Payer: Medicare Other | Source: Ambulatory Visit | Attending: Pulmonary Disease | Admitting: Pulmonary Disease

## 2016-12-19 ENCOUNTER — Other Ambulatory Visit: Payer: Self-pay | Admitting: Pulmonary Disease

## 2016-12-19 ENCOUNTER — Other Ambulatory Visit (INDEPENDENT_AMBULATORY_CARE_PROVIDER_SITE_OTHER): Payer: Medicare Other

## 2016-12-19 DIAGNOSIS — C7A8 Other malignant neuroendocrine tumors: Secondary | ICD-10-CM

## 2016-12-19 DIAGNOSIS — N182 Chronic kidney disease, stage 2 (mild): Secondary | ICD-10-CM | POA: Diagnosis not present

## 2016-12-19 DIAGNOSIS — I6789 Other cerebrovascular disease: Secondary | ICD-10-CM | POA: Diagnosis not present

## 2016-12-19 DIAGNOSIS — C7A1 Malignant poorly differentiated neuroendocrine tumors: Secondary | ICD-10-CM | POA: Diagnosis not present

## 2016-12-19 LAB — BUN: BUN: 22 mg/dL (ref 6–23)

## 2016-12-19 LAB — CREATININE, SERUM: Creatinine, Ser: 1.33 mg/dL (ref 0.40–1.50)

## 2016-12-19 MED ORDER — IOPAMIDOL (ISOVUE-300) INJECTION 61%
80.0000 mL | Freq: Once | INTRAVENOUS | Status: AC | PRN
  Administered 2016-12-19: 80 mL via INTRAVENOUS

## 2016-12-20 LAB — PULMONARY FUNCTION TEST
DL/VA % pred: 66 %
DL/VA: 3.14 ml/min/mmHg/L
DLCO COR % PRED: 48 %
DLCO COR: 16.89 ml/min/mmHg
DLCO UNC % PRED: 47 %
DLCO UNC: 16.49 ml/min/mmHg
FEF 25-75 POST: 2.01 L/s
FEF 25-75 PRE: 1.42 L/s
FEF2575-%Change-Post: 41 %
FEF2575-%PRED-PRE: 55 %
FEF2575-%Pred-Post: 78 %
FEV1-%Change-Post: 8 %
FEV1-%PRED-POST: 87 %
FEV1-%PRED-PRE: 80 %
FEV1-POST: 2.68 L
FEV1-Pre: 2.48 L
FEV1FVC-%Change-Post: 1 %
FEV1FVC-%PRED-PRE: 90 %
FEV6-%CHANGE-POST: 7 %
FEV6-%PRED-PRE: 90 %
FEV6-%Pred-Post: 97 %
FEV6-POST: 3.79 L
FEV6-Pre: 3.51 L
FEV6FVC-%Change-Post: 2 %
FEV6FVC-%PRED-POST: 105 %
FEV6FVC-%Pred-Pre: 102 %
FVC-%Change-Post: 6 %
FVC-%Pred-Post: 93 %
FVC-%Pred-Pre: 87 %
FVC-PRE: 3.59 L
FVC-Post: 3.83 L
POST FEV6/FVC RATIO: 100 %
PRE FEV1/FVC RATIO: 69 %
Post FEV1/FVC ratio: 70 %
Pre FEV6/FVC Ratio: 98 %
RV % pred: 104 %
RV: 2.75 L
TLC % PRED: 84 %
TLC: 6.3 L

## 2016-12-20 NOTE — Progress Notes (Signed)
Spoke with patient and informed him of the results. Pt did not have any questions and verbalized understanding. He was instructed to contact the office if he thought of any questions. Nothing further is needed.

## 2016-12-20 NOTE — Progress Notes (Signed)
Attempted to contact patient, but phone continued to ring for prolonged period. Will try again at later time.

## 2016-12-21 ENCOUNTER — Telehealth: Payer: Self-pay | Admitting: *Deleted

## 2016-12-21 DIAGNOSIS — R918 Other nonspecific abnormal finding of lung field: Secondary | ICD-10-CM

## 2016-12-21 NOTE — Telephone Encounter (Signed)
Oncology Nurse Navigator Documentation  Oncology Nurse Navigator Flowsheets 12/21/2016  Navigator Location [facility]  Referral date to RadOnc/MedOnc 12/18/2016  Navigator Encounter Type Telephone/I followed up with Dr. [name] regarding scheduling for Mr. Norkus.  He states he will see him tomorrow.  I called Mr. Machnik and schedule him to be seen 12/22/16 labs at 9:15 and Dr. [name] at 9:45.  He verbalized understanding of appt time and place.   Telephone Outgoing Call  Confirmed Diagnosis Date 12/13/2016  Treatment Phase Pre-Tx/Tx Discussion  Barriers/Navigation Needs Coordination of Care  Interventions Coordination of Care  Coordination of Care Appts  Acuity Level 2  Time Spent with Patient 30

## 2016-12-22 ENCOUNTER — Telehealth: Payer: Self-pay | Admitting: Internal Medicine

## 2016-12-22 ENCOUNTER — Other Ambulatory Visit (HOSPITAL_BASED_OUTPATIENT_CLINIC_OR_DEPARTMENT_OTHER): Payer: Medicare Other

## 2016-12-22 ENCOUNTER — Ambulatory Visit (HOSPITAL_BASED_OUTPATIENT_CLINIC_OR_DEPARTMENT_OTHER): Payer: Medicare Other | Admitting: Internal Medicine

## 2016-12-22 ENCOUNTER — Encounter: Payer: Self-pay | Admitting: Internal Medicine

## 2016-12-22 DIAGNOSIS — C7A090 Malignant carcinoid tumor of the bronchus and lung: Secondary | ICD-10-CM | POA: Diagnosis not present

## 2016-12-22 DIAGNOSIS — I10 Essential (primary) hypertension: Secondary | ICD-10-CM

## 2016-12-22 DIAGNOSIS — R918 Other nonspecific abnormal finding of lung field: Secondary | ICD-10-CM

## 2016-12-22 DIAGNOSIS — K769 Liver disease, unspecified: Secondary | ICD-10-CM | POA: Diagnosis not present

## 2016-12-22 DIAGNOSIS — C3491 Malignant neoplasm of unspecified part of right bronchus or lung: Secondary | ICD-10-CM

## 2016-12-22 HISTORY — DX: Malignant neoplasm of unspecified part of right bronchus or lung: C34.91

## 2016-12-22 LAB — CBC WITH DIFFERENTIAL/PLATELET
BASO%: 0.3 % (ref 0.0–2.0)
Basophils Absolute: 0 10*3/uL (ref 0.0–0.1)
EOS ABS: 0 10*3/uL (ref 0.0–0.5)
EOS%: 0.7 % (ref 0.0–7.0)
HEMATOCRIT: 40.8 % (ref 38.4–49.9)
HEMOGLOBIN: 14.2 g/dL (ref 13.0–17.1)
LYMPH#: 2.1 10*3/uL (ref 0.9–3.3)
LYMPH%: 34.5 % (ref 14.0–49.0)
MCH: 29.2 pg (ref 27.2–33.4)
MCHC: 34.8 g/dL (ref 32.0–36.0)
MCV: 84 fL (ref 79.3–98.0)
MONO#: 0.5 10*3/uL (ref 0.1–0.9)
MONO%: 7.5 % (ref 0.0–14.0)
NEUT#: 3.5 10*3/uL (ref 1.5–6.5)
NEUT%: 57 % (ref 39.0–75.0)
Platelets: 230 10*3/uL (ref 140–400)
RBC: 4.86 10*6/uL (ref 4.20–5.82)
RDW: 13.1 % (ref 11.0–14.6)
WBC: 6.1 10*3/uL (ref 4.0–10.3)

## 2016-12-22 LAB — COMPREHENSIVE METABOLIC PANEL
ALBUMIN: 3.7 g/dL (ref 3.5–5.0)
ALK PHOS: 100 U/L (ref 40–150)
ALT: 17 U/L (ref 0–55)
AST: 19 U/L (ref 5–34)
Anion Gap: 11 mEq/L (ref 3–11)
BILIRUBIN TOTAL: 0.57 mg/dL (ref 0.20–1.20)
BUN: 17.7 mg/dL (ref 7.0–26.0)
CALCIUM: 9 mg/dL (ref 8.4–10.4)
CO2: 27 mEq/L (ref 22–29)
Chloride: 100 mEq/L (ref 98–109)
Creatinine: 1.6 mg/dL — ABNORMAL HIGH (ref 0.7–1.3)
EGFR: 50 mL/min/{1.73_m2} — AB (ref >90)
GLUCOSE: 167 mg/dL — AB (ref 70–140)
Potassium: 3.9 mEq/L (ref 3.5–5.1)
SODIUM: 138 meq/L (ref 136–145)
TOTAL PROTEIN: 7.3 g/dL (ref 6.4–8.3)
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
## 2016-12-22 NOTE — Telephone Encounter (Signed)
Appointments scheduled per 5.4.18 LOS. Patient given AVS report and calendars with future scheduled appointments. °

## 2016-12-26 ENCOUNTER — Encounter: Payer: Self-pay | Admitting: *Deleted

## 2016-12-26 NOTE — Progress Notes (Signed)
Oncology Nurse Navigator Documentation  Oncology Nurse Navigator Flowsheets 12/26/2016  Navigator Location [facility]  Navigator Encounter Type Other/I contacted authorization coordinator to check and see if US biopsy needs authorization before scheduling.  I called central scheduling as well.  I left vm message for Nicole Kindred to call me with an update.   Treatment Phase Pre-Tx/Tx Discussion  Barriers/Navigation Needs Coordination of Care  Interventions Coordination of Care  Coordination of Care Radiology;Other  Acuity Level 2  Time Spent with Patient 30

## 2016-12-27 ENCOUNTER — Encounter: Payer: Self-pay | Admitting: Family Medicine

## 2016-12-28 ENCOUNTER — Telehealth: Payer: Self-pay | Admitting: Internal Medicine

## 2016-12-28 NOTE — Telephone Encounter (Signed)
Confirmed r/s appt to 5/23 at 315 per sch msg

## 2017-01-03 ENCOUNTER — Other Ambulatory Visit: Payer: Self-pay | Admitting: Radiology

## 2017-01-04 ENCOUNTER — Encounter (HOSPITAL_COMMUNITY): Payer: Self-pay

## 2017-01-04 ENCOUNTER — Ambulatory Visit (HOSPITAL_COMMUNITY)
Admission: RE | Admit: 2017-01-04 | Discharge: 2017-01-04 | Disposition: A | Discharge status: Home / Self Care | Payer: Medicare Other | Source: Ambulatory Visit | Attending: Internal Medicine | Admitting: Internal Medicine

## 2017-01-04 DIAGNOSIS — K219 Gastro-esophageal reflux disease without esophagitis: Secondary | ICD-10-CM | POA: Insufficient documentation

## 2017-01-04 DIAGNOSIS — C7B02 Secondary carcinoid tumors of liver: Secondary | ICD-10-CM | POA: Insufficient documentation

## 2017-01-04 DIAGNOSIS — Z85118 Personal history of other malignant neoplasm of bronchus and lung: Secondary | ICD-10-CM | POA: Insufficient documentation

## 2017-01-04 DIAGNOSIS — Z7982 Long term (current) use of aspirin: Secondary | ICD-10-CM | POA: Insufficient documentation

## 2017-01-04 DIAGNOSIS — D490 Neoplasm of unspecified behavior of digestive system: Secondary | ICD-10-CM | POA: Diagnosis not present

## 2017-01-04 DIAGNOSIS — C3491 Malignant neoplasm of unspecified part of right bronchus or lung: Secondary | ICD-10-CM | POA: Diagnosis not present

## 2017-01-04 DIAGNOSIS — K769 Liver disease, unspecified: Secondary | ICD-10-CM | POA: Diagnosis present

## 2017-01-04 DIAGNOSIS — M109 Gout, unspecified: Secondary | ICD-10-CM | POA: Insufficient documentation

## 2017-01-04 DIAGNOSIS — Z87891 Personal history of nicotine dependence: Secondary | ICD-10-CM | POA: Diagnosis not present

## 2017-01-04 DIAGNOSIS — E785 Hyperlipidemia, unspecified: Secondary | ICD-10-CM | POA: Insufficient documentation

## 2017-01-04 DIAGNOSIS — C7A1 Malignant poorly differentiated neuroendocrine tumors: Secondary | ICD-10-CM | POA: Diagnosis not present

## 2017-01-04 DIAGNOSIS — I129 Hypertensive chronic kidney disease with stage 1 through stage 4 chronic kidney disease, or unspecified chronic kidney disease: Secondary | ICD-10-CM | POA: Insufficient documentation

## 2017-01-04 DIAGNOSIS — D3A8 Other benign neuroendocrine tumors: Secondary | ICD-10-CM | POA: Insufficient documentation

## 2017-01-04 DIAGNOSIS — K7689 Other specified diseases of liver: Secondary | ICD-10-CM | POA: Diagnosis not present

## 2017-01-04 DIAGNOSIS — N189 Chronic kidney disease, unspecified: Secondary | ICD-10-CM | POA: Diagnosis not present

## 2017-01-04 DIAGNOSIS — E1122 Type 2 diabetes mellitus with diabetic chronic kidney disease: Secondary | ICD-10-CM | POA: Diagnosis not present

## 2017-01-04 DIAGNOSIS — F4310 Post-traumatic stress disorder, unspecified: Secondary | ICD-10-CM | POA: Insufficient documentation

## 2017-01-04 DIAGNOSIS — F329 Major depressive disorder, single episode, unspecified: Secondary | ICD-10-CM | POA: Insufficient documentation

## 2017-01-04 DIAGNOSIS — J439 Emphysema, unspecified: Secondary | ICD-10-CM | POA: Insufficient documentation

## 2017-01-04 LAB — COMPREHENSIVE METABOLIC PANEL
ALBUMIN: 4 g/dL (ref 3.5–5.0)
ALT: 18 U/L (ref 17–63)
ANION GAP: 9 (ref 5–15)
AST: 21 U/L (ref 15–41)
Alkaline Phosphatase: 108 U/L (ref 38–126)
BILIRUBIN TOTAL: 0.9 mg/dL (ref 0.3–1.2)
BUN: 19 mg/dL (ref 6–20)
CO2: 26 mmol/L (ref 22–32)
Calcium: 8.7 mg/dL — ABNORMAL LOW (ref 8.9–10.3)
Chloride: 98 mmol/L — ABNORMAL LOW (ref 101–111)
Creatinine, Ser: 1.23 mg/dL (ref 0.61–1.24)
GFR calc Af Amer: >60 mL/min (ref >60)
GFR, EST NON AFRICAN AMERICAN: 57 mL/min — AB (ref >60)
Glucose, Bld: 119 mg/dL — ABNORMAL HIGH (ref 65–99)
POTASSIUM: 3.3 mmol/L — AB (ref 3.5–5.1)
Sodium: 133 mmol/L — ABNORMAL LOW (ref 135–145)
TOTAL PROTEIN: 7.2 g/dL (ref 6.5–8.1)

## 2017-01-04 LAB — PROTIME-INR
INR: 0.99
PROTHROMBIN TIME: 13.1 s (ref 11.4–15.2)

## 2017-01-04 LAB — CBC WITH DIFFERENTIAL/PLATELET
BASOS PCT: 1 %
Basophils Absolute: 0 10*3/uL (ref 0.0–0.1)
Eosinophils Absolute: 0.1 10*3/uL (ref 0.0–0.7)
Eosinophils Relative: 2 %
HEMATOCRIT: 40.1 % (ref 39.0–52.0)
Hemoglobin: 14.5 g/dL (ref 13.0–17.0)
Lymphocytes Relative: 37 %
Lymphs Abs: 1.9 10*3/uL (ref 0.7–4.0)
MCH: 30 pg (ref 26.0–34.0)
MCHC: 36.2 g/dL — AB (ref 30.0–36.0)
MCV: 82.9 fL (ref 78.0–100.0)
MONO ABS: 0.3 10*3/uL (ref 0.1–1.0)
MONOS PCT: 6 %
NEUTROS ABS: 2.8 10*3/uL (ref 1.7–7.7)
Neutrophils Relative %: 54 %
Platelets: 231 10*3/uL (ref 150–400)
RBC: 4.84 MIL/uL (ref 4.22–5.81)
RDW: 13.1 % (ref 11.5–15.5)
WBC: 5.1 10*3/uL (ref 4.0–10.5)

## 2017-01-04 LAB — GLUCOSE, CAPILLARY: Glucose-Capillary: 119 mg/dL — ABNORMAL HIGH (ref 65–99)

## 2017-01-04 MED ORDER — MIDAZOLAM HCL 2 MG/2ML IJ SOLN
INTRAMUSCULAR | Status: AC | PRN
  Administered 2017-01-04 (×2): 1 mg via INTRAVENOUS

## 2017-01-04 MED ORDER — FENTANYL CITRATE (PF) 100 MCG/2ML IJ SOLN
INTRAMUSCULAR | Status: AC | PRN
  Administered 2017-01-04: 50 ug via INTRAVENOUS

## 2017-01-04 MED ORDER — FENTANYL CITRATE (PF) 100 MCG/2ML IJ SOLN
INTRAMUSCULAR | Status: AC
  Filled 2017-01-04: qty 4

## 2017-01-04 MED ORDER — MIDAZOLAM HCL 2 MG/2ML IJ SOLN
INTRAMUSCULAR | Status: AC
  Filled 2017-01-04: qty 6

## 2017-01-04 MED ORDER — SODIUM CHLORIDE 0.9 % IV SOLN
INTRAVENOUS | Status: DC
  Administered 2017-01-04: 12:00:00 via INTRAVENOUS

## 2017-01-04 NOTE — Procedures (Signed)
Pre Procedure Dx: Indeterminate liver lesion Post Procedural Dx: Same  Technically successful US guided biopsy of indeterminate lesion within the dome of the right lobe of the liver.  EBL: None  No immediate complications.   Ronny Bacon, MD Pager #: [phone]
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
## 2017-01-04 NOTE — Discharge Instructions (Signed)
Liver Biopsy, Care After Refer to this sheet in the next few weeks. These instructions provide you with information on caring for yourself after your procedure. Your health care provider may also give you more specific instructions. Your treatment has been planned according to current medical practices, but problems sometimes occur. Call your health care provider if you have any problems or questions after your procedure. What can I expect after the procedure? After your procedure, it is typical to have the following:  A small amount of discomfort in the area where the biopsy was done and in the right shoulder or shoulder blade.  A small amount of bruising around the area where the biopsy was done and on the skin over the liver.  Sleepiness and fatigue for the rest of the day. Follow these instructions at home:  Rest at home for 1-2 days or as directed by your health care provider.  Have a friend or family member stay with you for at least 24 hours.  Because of the medicines used during the procedure, you should not do the following things in the first 24 hours:  Drive.  Use machinery.  Be responsible for the care of other people.  Sign legal documents.  Take a bath or shower.  There are many different ways to close and cover an incision, including stitches, skin glue, and adhesive strips. Follow your health care provider's instructions on:  Incision care.  Bandage (dressing) changes and removal.  Incision closure removal.  Do not drink alcohol in the first week.  Do not lift more than 5 pounds or play contact sports for 2 weeks after this test.  Take medicines only as directed by your health care provider. Do not take medicine containing aspirin or non-steroidal anti-inflammatory medicines such as ibuprofen for 1 week after this test.  It is your responsibility to get your test results. Contact a health care provider if:  You have increased bleeding from an incision that  results in more than a small spot of blood.  You have redness, swelling, or increasing pain in any incisions.  You notice a discharge or a bad smell coming from any of your incisions.  You have a fever or chills. Get help right away if:  You develop swelling, bloating, or pain in your abdomen.  You become dizzy or faint.  You develop a rash.  You are nauseous or vomit.  You have difficulty breathing, feel short of breath, or feel faint.  You develop chest pain.  You have problems with your speech or vision.  You have trouble balancing or moving your arms or legs. This information is not intended to replace advice given to you by your health care provider. Make sure you discuss any questions you have with your health care provider. Document Released: 02/24/2005 Document Revised: 01/13/2016 Document Reviewed: 10/03/2013 Elsevier Interactive Patient Education  2017 [place].     Moderate Conscious Sedation, Adult, Care After These instructions provide you with information about caring for yourself after your procedure. Your health care provider may also give you more specific instructions. Your treatment has been planned according to current medical practices, but problems sometimes occur. Call your health care provider if you have any problems or questions after your procedure. What can I expect after the procedure? After your procedure, it is common:  To feel sleepy for several hours.  To feel clumsy and have poor balance for several hours.  To have poor judgment for several hours.  To vomit  if you eat too soon. Follow these instructions at home: For at least 24 hours after the procedure:    Do not:  Participate in activities where you could fall or become injured.  Drive.  Use heavy machinery.  Drink alcohol.  Take sleeping pills or medicines that cause drowsiness.  Make important decisions or sign legal documents.  Take care of children on your  own.  Rest. Eating and drinking   Follow the diet recommended by your health care provider.  If you vomit:  Drink water, juice, or soup when you can drink without vomiting.  Make sure you have little or no nausea before eating solid foods. General instructions   Have a responsible adult stay with you until you are awake and alert.  Take over-the-counter and prescription medicines only as told by your health care provider.  If you smoke, do not smoke without supervision.  Keep all follow-up visits as told by your health care provider. This is important. Contact a health care provider if:  You keep feeling nauseous or you keep vomiting.  You feel light-headed.  You develop a rash.  You have a fever. Get help right away if:  You have trouble breathing. This information is not intended to replace advice given to you by your health care provider. Make sure you discuss any questions you have with your health care provider. Document Released: 05/28/2013 Document Revised: 01/10/2016 Document Reviewed: 11/27/2015 Elsevier Interactive Patient Education  2017 [employer].

## 2017-01-05 ENCOUNTER — Ambulatory Visit (INDEPENDENT_AMBULATORY_CARE_PROVIDER_SITE_OTHER): Payer: Medicare Other | Admitting: Pulmonary Disease

## 2017-01-05 ENCOUNTER — Other Ambulatory Visit: Payer: Medicare Other

## 2017-01-05 ENCOUNTER — Encounter: Payer: Self-pay | Admitting: Pulmonary Disease

## 2017-01-05 ENCOUNTER — Ambulatory Visit: Payer: Medicare Other | Admitting: Nurse Practitioner

## 2017-01-05 ENCOUNTER — Encounter: Payer: Self-pay | Admitting: Family Medicine

## 2017-01-05 VITALS — BP 110/62 | HR 64 | Temp 98.4°F | Ht 75.0 in | Wt 182.0 lb

## 2017-01-05 DIAGNOSIS — C3491 Malignant neoplasm of unspecified part of right bronchus or lung: Secondary | ICD-10-CM

## 2017-01-05 DIAGNOSIS — J439 Emphysema, unspecified: Secondary | ICD-10-CM
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
## 2017-01-05 NOTE — Patient Instructions (Signed)
   Call me if you have any new breathing problems or questions before your next appointment.  I will see you back in 3 months or sooner if needed.  

## 2017-01-10 ENCOUNTER — Telehealth: Payer: Self-pay | Admitting: Internal Medicine

## 2017-01-10 ENCOUNTER — Ambulatory Visit (HOSPITAL_BASED_OUTPATIENT_CLINIC_OR_DEPARTMENT_OTHER): Payer: Medicare Other | Admitting: Internal Medicine

## 2017-01-10 ENCOUNTER — Other Ambulatory Visit (HOSPITAL_BASED_OUTPATIENT_CLINIC_OR_DEPARTMENT_OTHER): Payer: Medicare Other

## 2017-01-10 ENCOUNTER — Encounter: Payer: Self-pay | Admitting: Internal Medicine

## 2017-01-10 VITALS — BP 127/53 | HR 66 | Temp 97.8°F | Resp 18 | Ht 75.0 in | Wt 184.5 lb

## 2017-01-10 DIAGNOSIS — I10 Essential (primary) hypertension: Secondary | ICD-10-CM | POA: Diagnosis not present

## 2017-01-10 DIAGNOSIS — Z7189 Other specified counseling: Secondary | ICD-10-CM

## 2017-01-10 DIAGNOSIS — M109 Gout, unspecified: Secondary | ICD-10-CM | POA: Diagnosis not present

## 2017-01-10 DIAGNOSIS — C7B02 Secondary carcinoid tumors of liver: Secondary | ICD-10-CM

## 2017-01-10 DIAGNOSIS — C7A090 Malignant carcinoid tumor of the bronchus and lung: Secondary | ICD-10-CM | POA: Diagnosis not present

## 2017-01-10 DIAGNOSIS — Z5111 Encounter for antineoplastic chemotherapy: Secondary | ICD-10-CM | POA: Insufficient documentation

## 2017-01-10 DIAGNOSIS — C3491 Malignant neoplasm of unspecified part of right bronchus or lung: Secondary | ICD-10-CM

## 2017-01-10 LAB — CBC WITH DIFFERENTIAL/PLATELET
BASO%: 0.7 % (ref 0.0–2.0)
BASOS ABS: 0 10*3/uL (ref 0.0–0.1)
EOS ABS: 0.1 10*3/uL (ref 0.0–0.5)
EOS%: 1 % (ref 0.0–7.0)
HCT: 39.3 % (ref 38.4–49.9)
HGB: 13.9 g/dL (ref 13.0–17.1)
LYMPH%: 34.9 % (ref 14.0–49.0)
MCH: 29.6 pg (ref 27.2–33.4)
MCHC: 35.4 g/dL (ref 32.0–36.0)
MCV: 83.8 fL (ref 79.3–98.0)
MONO#: 0.4 10*3/uL (ref 0.1–0.9)
MONO%: 6.2 % (ref 0.0–14.0)
NEUT#: 3.4 10*3/uL (ref 1.5–6.5)
NEUT%: 57.2 % (ref 39.0–75.0)
Platelets: 191 10*3/uL (ref 140–400)
RBC: 4.69 10*6/uL (ref 4.20–5.82)
RDW: 13.3 % (ref 11.0–14.6)
WBC: 5.9 10*3/uL (ref 4.0–10.3)
lymph#: 2.1 10*3/uL (ref 0.9–3.3)

## 2017-01-10 LAB — COMPREHENSIVE METABOLIC PANEL
ALK PHOS: 108 U/L (ref 40–150)
ALT: 18 U/L (ref 0–55)
AST: 17 U/L (ref 5–34)
Albumin: 3.8 g/dL (ref 3.5–5.0)
Anion Gap: 10 mEq/L (ref 3–11)
BUN: 22.8 mg/dL (ref 7.0–26.0)
CHLORIDE: 99 meq/L (ref 98–109)
CO2: 25 meq/L (ref 22–29)
Calcium: 8.9 mg/dL (ref 8.4–10.4)
Creatinine: 1.5 mg/dL — ABNORMAL HIGH (ref 0.7–1.3)
EGFR: 54 mL/min/{1.73_m2} — AB (ref >90)
GLUCOSE: 121 mg/dL (ref 70–140)
POTASSIUM: 3.8 meq/L (ref 3.5–5.1)
SODIUM: 134 meq/L — AB (ref 136–145)
Total Bilirubin: 0.53 mg/dL (ref 0.20–1.20)
Total Protein: 7.4 g/dL (ref 6.4–8.3)

## 2017-01-10 MED ORDER — EVEROLIMUS 10 MG PO TABS: 10.0000 mg | ORAL_TABLET | Freq: Every day | ORAL | 2 refills | Status: DC

## 2017-01-10 NOTE — Telephone Encounter (Signed)
Gave patient AVS and calender per 5/23 LOS  
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
## 2017-01-17 DIAGNOSIS — E118 Type 2 diabetes mellitus with unspecified complications: Secondary | ICD-10-CM | POA: Diagnosis not present

## 2017-01-17 LAB — HM DIABETES EYE EXAM

## 2017-01-22 NOTE — Addendum Note (Signed)
Addendum  created 01/22/17 1348 by Myrtie Soman, MD   Sign clinical note

## 2017-01-23 ENCOUNTER — Encounter: Payer: Self-pay | Admitting: Family Medicine

## 2017-01-30 ENCOUNTER — Telehealth: Payer: Self-pay | Admitting: Medical Oncology

## 2017-01-30 ENCOUNTER — Other Ambulatory Visit: Payer: Self-pay | Admitting: Medical Oncology

## 2017-01-30 DIAGNOSIS — K1379 Other lesions of oral mucosa: Secondary | ICD-10-CM

## 2017-01-30 NOTE — Telephone Encounter (Signed)
June 4th started affinitor -now has mouth sores that started 4 days ago especially under tongue. He bit his lip and now that area is sensitive and sore. Constantly sore and aggravated by hot liquids and cantelope.

## 2017-01-30 NOTE — Telephone Encounter (Signed)
Per [name] I instructed pt to get Biotene and salt water rinses and use for sore mouth.

## 2017-02-01 ENCOUNTER — Telehealth: Payer: Self-pay | Admitting: Emergency Medicine

## 2017-02-01 NOTE — Telephone Encounter (Signed)
Patient's significant other called stating patient is continuing to have mouth sores; states the biotene mouth wash gives him about an hour relief. She has given him a Glucerna to drink since he is not eating well.   Per Dr [name]; advised patient to hold the Afinitor until seen in the office on Tueday 6/19 and to continue the mouth washes and try to push fluids. Advised to call for any worsening conditions.

## 2017-02-06 ENCOUNTER — Encounter: Payer: Self-pay | Admitting: Internal Medicine

## 2017-02-06 ENCOUNTER — Telehealth: Payer: Self-pay | Admitting: Internal Medicine

## 2017-02-06 ENCOUNTER — Ambulatory Visit (HOSPITAL_BASED_OUTPATIENT_CLINIC_OR_DEPARTMENT_OTHER): Payer: Medicare Other | Admitting: Internal Medicine

## 2017-02-06 ENCOUNTER — Other Ambulatory Visit (HOSPITAL_BASED_OUTPATIENT_CLINIC_OR_DEPARTMENT_OTHER): Payer: Medicare Other

## 2017-02-06 VITALS — BP 116/65 | HR 70 | Temp 97.0°F | Resp 18 | Ht 75.0 in | Wt 181.6 lb

## 2017-02-06 DIAGNOSIS — C7A090 Malignant carcinoid tumor of the bronchus and lung: Secondary | ICD-10-CM | POA: Diagnosis not present

## 2017-02-06 DIAGNOSIS — Z7189 Other specified counseling: Secondary | ICD-10-CM

## 2017-02-06 DIAGNOSIS — C7B02 Secondary carcinoid tumors of liver: Secondary | ICD-10-CM | POA: Diagnosis not present

## 2017-02-06 DIAGNOSIS — C3491 Malignant neoplasm of unspecified part of right bronchus or lung: Secondary | ICD-10-CM

## 2017-02-06 DIAGNOSIS — E119 Type 2 diabetes mellitus without complications: Secondary | ICD-10-CM | POA: Diagnosis not present

## 2017-02-06 DIAGNOSIS — Z5111 Encounter for antineoplastic chemotherapy: Secondary | ICD-10-CM

## 2017-02-06 LAB — CBC WITH DIFFERENTIAL/PLATELET
BASO%: 1.4 % (ref 0.0–2.0)
Basophils Absolute: 0.1 10*3/uL (ref 0.0–0.1)
EOS%: 1.2 % (ref 0.0–7.0)
Eosinophils Absolute: 0 10*3/uL (ref 0.0–0.5)
HEMATOCRIT: 37.5 % — AB (ref 38.4–49.9)
HEMOGLOBIN: 12.9 g/dL — AB (ref 13.0–17.1)
LYMPH#: 1.3 10*3/uL (ref 0.9–3.3)
LYMPH%: 35.5 % (ref 14.0–49.0)
MCH: 28.5 pg (ref 27.2–33.4)
MCHC: 34.5 g/dL (ref 32.0–36.0)
MCV: 82.6 fL (ref 79.3–98.0)
MONO#: 0.5 10*3/uL (ref 0.1–0.9)
MONO%: 14.3 % — AB (ref 0.0–14.0)
NEUT#: 1.8 10*3/uL (ref 1.5–6.5)
NEUT%: 47.6 % (ref 39.0–75.0)
Platelets: 191 10*3/uL (ref 140–400)
RBC: 4.54 10*6/uL (ref 4.20–5.82)
RDW: 13.5 % (ref 11.0–14.6)
WBC: 3.7 10*3/uL — AB (ref 4.0–10.3)

## 2017-02-06 LAB — COMPREHENSIVE METABOLIC PANEL
ALBUMIN: 3.3 g/dL — AB (ref 3.5–5.0)
ALK PHOS: 92 U/L (ref 40–150)
ALT: 18 U/L (ref 0–55)
AST: 23 U/L (ref 5–34)
Anion Gap: 7 mEq/L (ref 3–11)
BUN: 21 mg/dL (ref 7.0–26.0)
CALCIUM: 8.8 mg/dL (ref 8.4–10.4)
CHLORIDE: 93 meq/L — AB (ref 98–109)
CO2: 28 mEq/L (ref 22–29)
CREATININE: 1.5 mg/dL — AB (ref 0.7–1.3)
EGFR: 53 mL/min/{1.73_m2} — ABNORMAL LOW (ref >90)
GLUCOSE: 153 mg/dL — AB (ref 70–140)
Potassium: 3.8 mEq/L (ref 3.5–5.1)
SODIUM: 128 meq/L — AB (ref 136–145)
Total Bilirubin: 0.5 mg/dL (ref 0.20–1.20)
Total Protein: 7.1 g/dL (ref 6.4–8.3)

## 2017-02-06 NOTE — Telephone Encounter (Signed)
Appointments scheduled per 02/06/17 los. Patient was given a copy of the AVS report and appointment schedule, per 02/06/17 los.
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
## 2017-02-23 ENCOUNTER — Telehealth: Payer: Self-pay | Admitting: Emergency Medicine

## 2017-02-23 NOTE — Telephone Encounter (Signed)
Call received from patient's significant other; states he is having more mouth sores and swelling to the right side of his jaw. Denies any jaw pain; patient also called dentist as well. States he also had a temp of 99.2 today. Advised to hold the Affinitor until seen in the office on Tuesday 7/10 and to continue using the biotene and salt water rinses as instructed. Advised to call or go to the ER if jaw swelling worsens or temp goes higher than 100.5. Patient's caregiver verbalized understanding.

## 2017-02-27 ENCOUNTER — Other Ambulatory Visit (HOSPITAL_BASED_OUTPATIENT_CLINIC_OR_DEPARTMENT_OTHER): Payer: Medicare Other

## 2017-02-27 ENCOUNTER — Telehealth: Payer: Self-pay | Admitting: Nurse Practitioner

## 2017-02-27 ENCOUNTER — Other Ambulatory Visit (HOSPITAL_COMMUNITY)
Admission: RE | Admit: 2017-02-27 | Discharge: 2017-02-27 | Disposition: A | Discharge status: Home / Self Care | Payer: Medicare Other | Source: Other Acute Inpatient Hospital | Attending: Internal Medicine | Admitting: Internal Medicine

## 2017-02-27 ENCOUNTER — Ambulatory Visit (HOSPITAL_BASED_OUTPATIENT_CLINIC_OR_DEPARTMENT_OTHER): Payer: Medicare Other | Admitting: Nurse Practitioner

## 2017-02-27 VITALS — BP 147/79 | HR 78 | Temp 98.7°F | Resp 18 | Ht 75.0 in | Wt 181.9 lb

## 2017-02-27 DIAGNOSIS — C7A090 Malignant carcinoid tumor of the bronchus and lung: Secondary | ICD-10-CM | POA: Diagnosis not present

## 2017-02-27 DIAGNOSIS — C3491 Malignant neoplasm of unspecified part of right bronchus or lung: Secondary | ICD-10-CM

## 2017-02-27 DIAGNOSIS — K1231 Oral mucositis (ulcerative) due to antineoplastic therapy: Secondary | ICD-10-CM | POA: Diagnosis not present

## 2017-02-27 DIAGNOSIS — C7B02 Secondary carcinoid tumors of liver: Secondary | ICD-10-CM | POA: Diagnosis not present

## 2017-02-27 DIAGNOSIS — Z5111 Encounter for antineoplastic chemotherapy: Secondary | ICD-10-CM

## 2017-02-27 LAB — COMPREHENSIVE METABOLIC PANEL
ALK PHOS: 85 U/L (ref 38–126)
ALT: 19 U/L (ref 17–63)
AST: 21 U/L (ref 15–41)
Albumin: 3.6 g/dL (ref 3.5–5.0)
Anion gap: 9 (ref 5–15)
BUN: 20 mg/dL (ref 6–20)
CALCIUM: 8.6 mg/dL — AB (ref 8.9–10.3)
CHLORIDE: 91 mmol/L — AB (ref 101–111)
CO2: 29 mmol/L (ref 22–32)
CREATININE: 1.47 mg/dL — AB (ref 0.61–1.24)
GFR calc Af Amer: 53 mL/min — ABNORMAL LOW (ref >60)
GFR calc non Af Amer: 46 mL/min — ABNORMAL LOW (ref >60)
GLUCOSE: 119 mg/dL — AB (ref 65–99)
Potassium: 3.6 mmol/L (ref 3.5–5.1)
SODIUM: 129 mmol/L — AB (ref 135–145)
Total Bilirubin: 0.7 mg/dL (ref 0.3–1.2)
Total Protein: 7.1 g/dL (ref 6.5–8.1)

## 2017-02-27 LAB — CBC WITH DIFFERENTIAL/PLATELET
BASO%: 1 % (ref 0.0–2.0)
Basophils Absolute: 0 10*3/uL (ref 0.0–0.1)
EOS ABS: 0 10*3/uL (ref 0.0–0.5)
EOS%: 0.7 % (ref 0.0–7.0)
HCT: 33.7 % — ABNORMAL LOW (ref 38.4–49.9)
HEMOGLOBIN: 11.6 g/dL — AB (ref 13.0–17.1)
LYMPH%: 32.7 % (ref 14.0–49.0)
MCH: 28.2 pg (ref 27.2–33.4)
MCHC: 34.4 g/dL (ref 32.0–36.0)
MCV: 81.9 fL (ref 79.3–98.0)
MONO#: 0.6 10*3/uL (ref 0.1–0.9)
MONO%: 17.2 % — AB (ref 0.0–14.0)
NEUT%: 48.4 % (ref 39.0–75.0)
NEUTROS ABS: 1.6 10*3/uL (ref 1.5–6.5)
Platelets: 151 10*3/uL (ref 140–400)
RBC: 4.11 10*6/uL — AB (ref 4.20–5.82)
RDW: 13.4 % (ref 11.0–14.6)
WBC: 3.4 10*3/uL — ABNORMAL LOW (ref 4.0–10.3)
lymph#: 1.1 10*3/uL (ref 0.9–3.3)

## 2017-02-27 NOTE — Telephone Encounter (Signed)
Scheduled appt per 7/10 los - Gave patient AVS and calender per los.

## 2017-02-27 NOTE — Progress Notes (Signed)
  [facility] OFFICE PROGRESS NOTE   Diagnosis: Stage IV low-grade neuroendocrine carcinoma, carcinoid tumor   INTERVAL HISTORY:  Richard Navarro returns today as scheduled. He reports a painful mouth sore to the left cheek and decreased po intake. He had an elevated temp of 99.2 which prompted him to call [facility] last week and per advice he has been off Afinitor since 02/23/17. This is his second occurrence with mucositis requiring treatment interruption. He visited his dentist who cauterized the ulcer and prescribed oral clobetasol and "SalivaMax" mouth rinse. These provide modest relief but the agent to provide the most benefit is [place]agel. He continues biotene and salt water rinses without relief. He has an occasional cough and thick mucus but unchanged from baseline. Denies shortness of breath or hemoptysis.   ROS: Constitutional: Mild fatigue. Denies chills or weight loss.    HEENT: As per HPI.  Resp:  As per HPI. Cardio: Denies chest pain, palpitations, or edema.  GI: Denies dysphagia, abdominal pain, n/v/c/d. No blood in stool.  Skin: Dry skin. Denies rash.  Objective:  Vital signs in last 24 hours:  Blood pressure (!) 147/79, pulse 78, temperature 98.7 F (37.1 C), temperature source Oral, resp. rate 18, height 6\' 3"  (1.905 m), weight 181 lb 14.4 oz (82.5 kg), SpO2 100 %.    HEENT: Oral mucosa is pink and moist. No thrush. Ulcer to left posterior buccal surface. Lymphatics: No Cervical or supraclavicular lymphadenopathy. Resp: Regular rate and effort. Clear to auscultation bilaterally.  Cardio: Regular rate and rhythm. S1 and S2 normal, no murmur, click, rub or gallop.  GI: Soft and round, nontender. Normal bowel sounds throughout.  Vascular: No lower extremity edema.  Skin: Warm, dry, intact.   Lab Results:  Lab Results  Component Value Date   WBC 3.4 (L) 02/27/2017   HGB 11.6 (L) 02/27/2017   HCT 33.7 (L) 02/27/2017   MCV 81.9 02/27/2017   PLT 151 02/27/2017   NEUTROABS 1.6 02/27/2017    Imaging:  No results found.  Medications: I have reviewed the patient's current medications.  Assessment/Plan: 1.metastatic low-grade neuroendocrine carcinoma, carcinoid tumor involving the lung and liver diagnosed in April 2018. Afinitor 10 mg daily initiated early June 2018. Treatment held due to mucositis, resumed 6/19 and again placed on hold due to mucositis.  2.Mucositis likely related to Afinitor   Disposition: Richard Navarro comes in today with recurrent mucositis after restarting Afinitor 10 mg. Per Dr. [name], he will hold his treatment for the next 5 days. Prescription for Magic Mouthwash will be sent to his pharmacy and he will continue biotene and salt water rinses and Oragel as needed. We discussed a Dexamethasone mouth rinse but he wishes to see how Magic Mouthwash will work first. He will resume Afinitor at the same dose after 5 days. He will call our clinic if the mouth sore worsens or fails to improve. He will return in 2-3 weeks for an office visit.    Alla Feeling NP Student   02/27/2017  4:41 PM  Patient seen with Cira Rue. I agree with the above. He will treat the mouth ulcer symptomatically as outlined above with plans to resume Afinitor at the current dose of 10 mg daily in 5 days. He understands to contact the office if he develops additional mouth ulcers after resuming Afinitor. He will return for a follow-up visit in 2-3 weeks.  Ned Card, NP

## 2017-03-01 ENCOUNTER — Other Ambulatory Visit: Payer: Self-pay | Admitting: *Deleted

## 2017-03-01 MED ORDER — MAGIC MOUTHWASH W/LIDOCAINE: ORAL | 1 refills | Status: DC

## 2017-03-02 ENCOUNTER — Telehealth: Payer: Self-pay | Admitting: Medical Oncology

## 2017-03-02 NOTE — Telephone Encounter (Signed)
err

## 2017-03-21 ENCOUNTER — Encounter (HOSPITAL_COMMUNITY): Payer: Self-pay

## 2017-03-21 ENCOUNTER — Emergency Department (HOSPITAL_COMMUNITY)
Admission: EM | Admit: 2017-03-21 | Discharge: 2017-03-22 | Disposition: A | Discharge status: Home / Self Care | Payer: Medicare Other | Attending: Emergency Medicine | Admitting: Emergency Medicine

## 2017-03-21 DIAGNOSIS — Z79899 Other long term (current) drug therapy: Secondary | ICD-10-CM | POA: Diagnosis not present

## 2017-03-21 DIAGNOSIS — E119 Type 2 diabetes mellitus without complications: Secondary | ICD-10-CM | POA: Diagnosis not present

## 2017-03-21 DIAGNOSIS — Z87891 Personal history of nicotine dependence: Secondary | ICD-10-CM | POA: Insufficient documentation

## 2017-03-21 DIAGNOSIS — R220 Localized swelling, mass and lump, head: Secondary | ICD-10-CM | POA: Diagnosis present

## 2017-03-21 DIAGNOSIS — Z85118 Personal history of other malignant neoplasm of bronchus and lung: Secondary | ICD-10-CM | POA: Diagnosis not present

## 2017-03-21 DIAGNOSIS — T783XXA Angioneurotic edema, initial encounter: Secondary | ICD-10-CM | POA: Diagnosis not present

## 2017-03-21 DIAGNOSIS — I10 Essential (primary) hypertension: Secondary | ICD-10-CM | POA: Insufficient documentation

## 2017-03-21 NOTE — ED Triage Notes (Signed)
Pt complains of tongue swelling since earlier today, denies any trouble breathing at this time Pt has lung cancer and takes a medication that he's suppose to come off of periodically and this time he didn't

## 2017-03-22 ENCOUNTER — Telehealth: Payer: Self-pay | Admitting: Medical Oncology

## 2017-03-22 DIAGNOSIS — T783XXA Angioneurotic edema, initial encounter: Secondary | ICD-10-CM | POA: Diagnosis not present

## 2017-03-22 MED ORDER — FAMOTIDINE 20 MG PO TABS
20.0000 mg | ORAL_TABLET | Freq: Once | ORAL | Status: AC
  Administered 2017-03-22: 20 mg via ORAL
  Filled 2017-03-22: qty 1

## 2017-03-22 MED ORDER — DIPHENHYDRAMINE HCL 25 MG PO CAPS
25.0000 mg | ORAL_CAPSULE | Freq: Once | ORAL | Status: AC
  Administered 2017-03-22: 25 mg via ORAL
  Filled 2017-03-22: qty 1

## 2017-03-22 MED ORDER — FAMOTIDINE 20 MG PO TABS: 20.0000 mg | ORAL_TABLET | Freq: Every day | ORAL | 0 refills | Status: DC

## 2017-03-22 MED ORDER — DIPHENHYDRAMINE HCL 25 MG PO TABS: 25.0000 mg | ORAL_TABLET | Freq: Three times a day (TID) | ORAL | 0 refills | Status: DC | PRN

## 2017-03-22 NOTE — Telephone Encounter (Signed)
[name] reported pt in ed last night for tongue swelling and it was attributed to interaction with lisinopril and afinitor-stopped lisinopril and pt to f/u wit PCP.
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
## 2017-03-22 NOTE — Discharge Instructions (Signed)
You were seen today with what appears to be angioedema. You were both on an ACE inhibitor and a chemotherapy agent that can cause this sort of reaction. Discontinue your lisinopril. Follow up with your primary physician for discussion of alternative blood pressure medications. Additionally, call your primary oncologist to discuss continuation of your chemotherapy medication. If you develop increased swelling, throat swelling, or any new or worsening symptoms you should be reevaluated immediately.

## 2017-03-22 NOTE — Telephone Encounter (Signed)
"  His next F/U with PCP is 03-29-2017.  He will be off B/P medicine until visit to be changed to a new B/P medication.  Told to also notify Dr. [name] for him to determine what action Dr. [name] would like to take."

## 2017-03-27 ENCOUNTER — Encounter: Payer: Self-pay | Admitting: Internal Medicine

## 2017-03-27 ENCOUNTER — Telehealth: Payer: Self-pay

## 2017-03-27 ENCOUNTER — Other Ambulatory Visit: Payer: Self-pay | Admitting: Medical Oncology

## 2017-03-27 ENCOUNTER — Telehealth: Payer: Self-pay | Admitting: Internal Medicine

## 2017-03-27 ENCOUNTER — Ambulatory Visit (HOSPITAL_BASED_OUTPATIENT_CLINIC_OR_DEPARTMENT_OTHER): Payer: Medicare Other | Admitting: Internal Medicine

## 2017-03-27 ENCOUNTER — Other Ambulatory Visit (HOSPITAL_BASED_OUTPATIENT_CLINIC_OR_DEPARTMENT_OTHER): Payer: Medicare Other

## 2017-03-27 VITALS — BP 152/67 | HR 89 | Temp 99.0°F | Resp 19 | Ht 75.0 in | Wt 181.8 lb

## 2017-03-27 DIAGNOSIS — C7B02 Secondary carcinoid tumors of liver: Secondary | ICD-10-CM | POA: Diagnosis not present

## 2017-03-27 DIAGNOSIS — C7A090 Malignant carcinoid tumor of the bronchus and lung: Secondary | ICD-10-CM

## 2017-03-27 DIAGNOSIS — C3491 Malignant neoplasm of unspecified part of right bronchus or lung: Secondary | ICD-10-CM

## 2017-03-27 DIAGNOSIS — I10 Essential (primary) hypertension: Secondary | ICD-10-CM

## 2017-03-27 DIAGNOSIS — Z5111 Encounter for antineoplastic chemotherapy: Secondary | ICD-10-CM

## 2017-03-27 LAB — COMPREHENSIVE METABOLIC PANEL
ALBUMIN: 3.1 g/dL — AB (ref 3.5–5.0)
ALK PHOS: 93 U/L (ref 40–150)
ALT: 18 U/L (ref 0–55)
ANION GAP: 8 meq/L (ref 3–11)
AST: 20 U/L (ref 5–34)
BILIRUBIN TOTAL: 0.3 mg/dL (ref 0.20–1.20)
BUN: 13.9 mg/dL (ref 7.0–26.0)
CO2: 23 mEq/L (ref 22–29)
Calcium: 8.7 mg/dL (ref 8.4–10.4)
Chloride: 102 mEq/L (ref 98–109)
Creatinine: 1.5 mg/dL — ABNORMAL HIGH (ref 0.7–1.3)
EGFR: 53 mL/min/{1.73_m2} — ABNORMAL LOW (ref >90)
GLUCOSE: 265 mg/dL — AB (ref 70–140)
Potassium: 4.3 mEq/L (ref 3.5–5.1)
Sodium: 134 mEq/L — ABNORMAL LOW (ref 136–145)
TOTAL PROTEIN: 7 g/dL (ref 6.4–8.3)

## 2017-03-27 LAB — CBC WITH DIFFERENTIAL/PLATELET
BASO%: 0.7 % (ref 0.0–2.0)
Basophils Absolute: 0 10*3/uL (ref 0.0–0.1)
EOS ABS: 0 10*3/uL (ref 0.0–0.5)
EOS%: 0.9 % (ref 0.0–7.0)
HCT: 31.9 % — ABNORMAL LOW (ref 38.4–49.9)
HGB: 10.8 g/dL — ABNORMAL LOW (ref 13.0–17.1)
LYMPH%: 19.2 % (ref 14.0–49.0)
MCH: 27.8 pg (ref 27.2–33.4)
MCHC: 33.8 g/dL (ref 32.0–36.0)
MCV: 82.3 fL (ref 79.3–98.0)
MONO#: 0.5 10*3/uL (ref 0.1–0.9)
MONO%: 9.1 % (ref 0.0–14.0)
NEUT%: 70.1 % (ref 39.0–75.0)
NEUTROS ABS: 3.8 10*3/uL (ref 1.5–6.5)
PLATELETS: 252 10*3/uL (ref 140–400)
RBC: 3.88 10*6/uL — AB (ref 4.20–5.82)
RDW: 14.2 % (ref 11.0–14.6)
WBC: 5.5 10*3/uL (ref 4.0–10.3)
lymph#: 1 10*3/uL (ref 0.9–3.3)
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
## 2017-03-27 NOTE — Telephone Encounter (Signed)
Gave patient avs and calendar for August.

## 2017-03-27 NOTE — Telephone Encounter (Signed)
Called patient to try to get him scheduled for an ED follow up as he was seen in the ED on 03/21/17. Dr. Yong Channel wants him scheduled for a 2 week follow up to assess blood pressure. I did not get an answer when I called so I will try again

## 2017-03-29 ENCOUNTER — Encounter: Payer: Self-pay | Admitting: Family Medicine

## 2017-03-29 ENCOUNTER — Ambulatory Visit (INDEPENDENT_AMBULATORY_CARE_PROVIDER_SITE_OTHER): Payer: Medicare Other | Admitting: Family Medicine

## 2017-03-29 ENCOUNTER — Encounter: Payer: Self-pay | Admitting: *Deleted

## 2017-03-29 DIAGNOSIS — E119 Type 2 diabetes mellitus without complications: Secondary | ICD-10-CM | POA: Diagnosis not present

## 2017-03-29 DIAGNOSIS — I10 Essential (primary) hypertension: Secondary | ICD-10-CM | POA: Diagnosis not present

## 2017-03-29 DIAGNOSIS — C3491 Malignant neoplasm of unspecified part of right bronchus or lung: Secondary | ICD-10-CM | POA: Diagnosis not present

## 2017-03-29 MED ORDER — AMLODIPINE BESYLATE 5 MG PO TABS: 5.0000 mg | ORAL_TABLET | Freq: Every day | ORAL | 5 refills | Status: DC

## 2017-03-29 NOTE — Assessment & Plan Note (Signed)
S: patient was diagnosed with Stage IV low-grade neuroendocrine carcinoma, carcinoid tumor after biopsy- originally discovered as he agreed to lung cancer screening program. On afinitor- question if this contributed to angioedema which led to ED visit- presently on hold as well as ace-i A/P: to follow up with oncology. Off afinitor for now. Will permanently d/c lisinopril. To be restaged soon- patient seems very upbeat. Main goal is to make it to [employer] game this fall at present. 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

## 2017-03-29 NOTE — Patient Instructions (Addendum)
Stop lovastatin at least until we get through chemotherapy/get some more updates on prognosis  Start amlodipine 5mg  for blood pressure  A1c up to 7.8. Would not start medicine at this point but lets have you tighten up diet and be as active as able considering treatments.   Recheck 14 weeks with me

## 2017-03-29 NOTE — Assessment & Plan Note (Signed)
Quit 10/2016

## 2017-03-29 NOTE — Progress Notes (Signed)
Subjective:  Richard Navarro is a 71 y.o. year old very pleasant male patient who presents for/with See problem oriented charting ROS- No chest pain or shortness of breath. No headache or blurry vision.  Does have some fatigue and intermittent cough   Past Medical History-  Patient Active Problem List   Diagnosis Date Noted  . Primary cancer of right lung ([place]) 12/22/2016    Priority: High  . Diabetes mellitus without complication ([place])     Priority: High  . Pulmonary emphysema ([place]) 12/01/2016    Priority: Medium  . PTSD (post-traumatic stress disorder) 12/07/2015    Priority: Medium  . Erectile dysfunction 04/09/2014    Priority: Medium  . Tobacco abuse 03/26/2014    Priority: Medium  . Hypertension     Priority: Medium  . Gout     Priority: Medium  . Depression     Priority: Medium  . Hyperlipidemia     Priority: Medium  . Encounter for antineoplastic chemotherapy 01/10/2017    Priority: Low  . Goals of care, counseling/discussion 01/10/2017    Priority: Low  . CKD (chronic kidney disease), stage II 04/17/2014    Priority: Low  . Prostate cancer screening 04/17/2014    Priority: Low  . Arthritis 03/26/2014    Priority: Low  . History of adenomatous polyp of colon 03/26/2014    Priority: Low  . GERD (gastroesophageal reflux disease)     Priority: Low    Medications- reviewed and updated Current Outpatient Prescriptions  Medication Sig Dispense Refill  . allopurinol (ZYLOPRIM) 100 MG tablet TAKE 1 TABLET DAILY 90 tablet 3  . aspirin 81 MG tablet Take 1 tablet (81 mg total) by mouth daily. 90 tablet 3  . diphenhydrAMINE (BENADRYL) 25 MG tablet Take 1 tablet (25 mg total) by mouth every 8 (eight) hours as needed. 20 tablet 0  . everolimus (AFINITOR) 10 MG tablet Take 1 tablet (10 mg total) by mouth daily. 30 tablet 2  . famotidine (PEPCID) 20 MG tablet Take 1 tablet (20 mg total) by mouth daily. 15 tablet 0  . lisinopril-hydrochlorothiazide (PRINZIDE,ZESTORETIC)  20-12.5 MG tablet     . lovastatin (MEVACOR) 20 MG tablet TAKE 1 TABLET TWO TO THREE TIMES A WEEK (Patient taking differently: Once a week) 36 tablet 3  . magic mouthwash w/lidocaine SOLN Swish and swallow 5 cc by mouth every six hours for mouth sores. 240 mL 1  . omeprazole (PRILOSEC) 20 MG capsule TAKE 1 CAPSULE DAILY 90 capsule 3  . tadalafil (CIALIS) 10 MG tablet Take 1 tablet (10 mg total) by mouth daily as needed for erectile dysfunction (every 72 hours maximum). 10 tablet 3  . Tetrahydrozoline HCl (EYE DROPS OP) Place 1 drop into both eyes daily as needed (redness relief).    . vitamin B-12 (CYANOCOBALAMIN) 1000 MCG tablet Take 1,000 mcg by mouth daily.    . vitamin C (ASCORBIC ACID) 500 MG tablet Take 500 mg by mouth daily.    . VOLTAREN 1 % GEL APPLY 2 GRAMS TOPICALLY FOUR TIMES A DAY AS NEEDED FOR LEFT SHOULDER ARTHRITIS 300 g 3   No current facility-administered medications for this visit.     Objective: BP 128/88 (BP Location: Left Arm, Patient Position: Sitting, Cuff Size: Large)   Pulse 78   Temp 98.4 F (36.9 C) (Oral)   Ht 6\' 3"  (1.905 m)   Wt 184 lb 12.8 oz (83.8 kg)   SpO2 96%   BMI 23.10 kg/m  Gen: NAD, resting  comfortably CV: RRR no murmurs rubs or gallops Lungs: CTAB no crackles, wheeze, rhonchi Abdomen: soft/nontender/nondistended/normal bowel sounds. No rebound or guarding.  Ext: no edema Skin: warm, dry Neuro: grossly normal, moves all extremities  Assessment/Plan:  Diabetes mellitus without complication S: has been diet controlled. Has not eaten as well or been as active since cancer diagnosis A/P: a1c up to 7.8. Patient to tighten up diet some. Think 8 reasonable goal given chemotherapy treatments- if gets above 8 would consider starting metformin but suspect he can correct this as has in past without rx   Hypertension S:PRIOR- Lisinopril 20-12.5 hctz daily 2 pills. Angioedema on lisinopril along with Afinitor. hctz stop with GFR below 60 after  chemo Home #s up to 144 or 150 at times (usually at goal <140/90) A/P: stop lisinopril hctz long term with angioedema on ace-I and GFR under 60 for hctz. Trial amlodipine 5mg .    Tobacco abuse Quit 10/2016  Primary cancer of right lung ([place]) S: patient was diagnosed with Stage IV low-grade neuroendocrine carcinoma, carcinoid tumor after biopsy- originally discovered as he agreed to lung cancer screening program. On afinitor- question if this contributed to angioedema which led to ED visit- presently on hold as well as ace-i A/P: to follow up with oncology. Off afinitor for now. Will permanently d/c lisinopril. To be restaged soon- patient seems very upbeat. Main goal is to make it to [employer] game this fall at present. 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    Return in about 14 weeks (around 07/05/2017) for follow up- or sooner if needed.  Meds ordered this encounter  Medications  . DISCONTD: lisinopril-hydrochlorothiazide (PRINZIDE,ZESTORETIC) 20-12.5 MG tablet  . amLODipine (NORVASC) 5 MG tablet    Sig: Take 1 tablet (5 mg total) by mouth daily.    Dispense:  30 tablet    Refill:  5    Return precautions advised.  [name], MD

## 2017-03-29 NOTE — Assessment & Plan Note (Signed)
S:PRIOR- Lisinopril 20-12.5 hctz daily 2 pills. Angioedema on lisinopril along with Afinitor. hctz stop with GFR below 60 after chemo Home #s up to 144 or 150 at times (usually at goal <140/90) A/P: stop lisinopril hctz long term with angioedema on ace-I and GFR under 60 for hctz. Trial amlodipine 5mg .

## 2017-03-29 NOTE — Progress Notes (Signed)
Oncology Nurse Navigator Documentation  Oncology Nurse Navigator Flowsheets 03/29/2017  Navigator Location [facility]  Navigator Encounter Type Other/I followed up on CT C/A/P authorization.  This has not been completed yet.  I contacted authorization coordinator with an update.   Barriers/Navigation Needs Coordination of Care  Interventions Coordination of Care  Coordination of Care Other  Acuity Level 2  Acuity Level 2 Other  Time Spent with Patient 30

## 2017-03-29 NOTE — Assessment & Plan Note (Signed)
S: has been diet controlled. Has not eaten as well or been as active since cancer diagnosis A/P: a1c up to 7.8. Patient to tighten up diet some. Think 8 reasonable goal given chemotherapy treatments- if gets above 8 would consider starting metformin but suspect he can correct this as has in past without rx

## 2017-03-30 LAB — POCT GLYCOSYLATED HEMOGLOBIN (HGB A1C): Hemoglobin A1C: 7.8

## 2017-03-30 NOTE — Addendum Note (Signed)
Addended by: Lyndle Herrlich on: 03/30/2017 05:05 PM   Modules accepted: Orders

## 2017-04-03 ENCOUNTER — Ambulatory Visit (HOSPITAL_COMMUNITY)
Admission: RE | Admit: 2017-04-03 | Discharge: 2017-04-03 | Disposition: A | Discharge status: Home / Self Care | Payer: Medicare Other | Source: Ambulatory Visit | Attending: Internal Medicine | Admitting: Internal Medicine

## 2017-04-03 DIAGNOSIS — K5900 Constipation, unspecified: Secondary | ICD-10-CM | POA: Diagnosis not present

## 2017-04-03 DIAGNOSIS — C3491 Malignant neoplasm of unspecified part of right bronchus or lung: Secondary | ICD-10-CM | POA: Diagnosis not present

## 2017-04-03 DIAGNOSIS — R590 Localized enlarged lymph nodes: Secondary | ICD-10-CM | POA: Diagnosis not present

## 2017-04-03 DIAGNOSIS — I2510 Atherosclerotic heart disease of native coronary artery without angina pectoris: Secondary | ICD-10-CM | POA: Insufficient documentation

## 2017-04-03 DIAGNOSIS — I700 Atherosclerosis of aorta: Secondary | ICD-10-CM | POA: Insufficient documentation

## 2017-04-03 DIAGNOSIS — J9811 Atelectasis: Secondary | ICD-10-CM | POA: Diagnosis not present

## 2017-04-03 DIAGNOSIS — M899 Disorder of bone, unspecified: Secondary | ICD-10-CM | POA: Insufficient documentation

## 2017-04-03 DIAGNOSIS — C3490 Malignant neoplasm of unspecified part of unspecified bronchus or lung: Secondary | ICD-10-CM | POA: Diagnosis not present

## 2017-04-03 DIAGNOSIS — J432 Centrilobular emphysema: Secondary | ICD-10-CM | POA: Insufficient documentation

## 2017-04-03 DIAGNOSIS — I10 Essential (primary) hypertension: Secondary | ICD-10-CM

## 2017-04-03 DIAGNOSIS — Z5111 Encounter for antineoplastic chemotherapy: Secondary | ICD-10-CM

## 2017-04-03 MED ORDER — IOPAMIDOL (ISOVUE-300) INJECTION 61%
INTRAVENOUS | Status: AC
  Filled 2017-04-03: qty 100

## 2017-04-03 MED ORDER — IOPAMIDOL (ISOVUE-300) INJECTION 61%
100.0000 mL | Freq: Once | INTRAVENOUS | Status: AC | PRN
  Administered 2017-04-03: 100 mL via INTRAVENOUS

## 2017-04-05 ENCOUNTER — Telehealth: Payer: Self-pay | Admitting: Internal Medicine

## 2017-04-05 ENCOUNTER — Ambulatory Visit (HOSPITAL_BASED_OUTPATIENT_CLINIC_OR_DEPARTMENT_OTHER): Payer: Medicare Other | Admitting: Oncology

## 2017-04-05 ENCOUNTER — Encounter: Payer: Self-pay | Admitting: Oncology

## 2017-04-05 VITALS — BP 164/81 | HR 82 | Temp 97.9°F | Resp 18 | Ht 75.0 in | Wt 179.6 lb

## 2017-04-05 DIAGNOSIS — C7B02 Secondary carcinoid tumors of liver: Secondary | ICD-10-CM

## 2017-04-05 DIAGNOSIS — Z5111 Encounter for antineoplastic chemotherapy: Secondary | ICD-10-CM

## 2017-04-05 DIAGNOSIS — C3491 Malignant neoplasm of unspecified part of right bronchus or lung: Secondary | ICD-10-CM

## 2017-04-05 DIAGNOSIS — Z7189 Other specified counseling: Secondary | ICD-10-CM

## 2017-04-05 DIAGNOSIS — I10 Essential (primary) hypertension: Secondary | ICD-10-CM

## 2017-04-05 DIAGNOSIS — C7A090 Malignant carcinoid tumor of the bronchus and lung: Secondary | ICD-10-CM | POA: Diagnosis not present

## 2017-04-05 MED ORDER — EVEROLIMUS 7.5 MG PO TABS: 7.5000 mg | ORAL_TABLET | Freq: Every day | ORAL | 1 refills | Status: DC

## 2017-04-05 NOTE — Telephone Encounter (Signed)
Spoke with patient regarding appt on 9/6. Did not want an avs or calendar.
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
## 2017-04-05 NOTE — Assessment & Plan Note (Signed)
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 The patient was started on treatment with Afinitor 10 mg by mouth daily status post 2 months of treatment and has been tolerating the treatment well except for the recent hypersensitivity reaction with swelling of the tongue and neck which was related to the combination of Afinitor and lisinopril. Treatment was placed on hold after 2 months.  Patient was seen with Dr. [name]. Recent restaging CT scan results were discussed with the patient. There was a slight increase in the lesion in the liver.Recommend that he restart treatment with Afinitor at a lower dose. He will take Afinitor 7.5 mg daily.  We will see him back in approximately 3 weeks to evaluate how he is tolerating treatment.  Message left for the oral chemotherapy navigator to assist [place] in getting this medication to the patient.  The patient will continue to follow-up with his primary care provider regarding his hypertension and diabetes.

## 2017-04-13 ENCOUNTER — Ambulatory Visit (INDEPENDENT_AMBULATORY_CARE_PROVIDER_SITE_OTHER): Payer: Medicare Other | Admitting: Pulmonary Disease

## 2017-04-13 ENCOUNTER — Encounter: Payer: Self-pay | Admitting: Pulmonary Disease

## 2017-04-13 VITALS — BP 122/66 | HR 71 | Ht 75.0 in | Wt 180.4 lb

## 2017-04-13 DIAGNOSIS — C3491 Malignant neoplasm of unspecified part of right bronchus or lung: Secondary | ICD-10-CM

## 2017-04-13 DIAGNOSIS — J439 Emphysema, unspecified: Secondary | ICD-10-CM

## 2017-04-13 DIAGNOSIS — Z23 Encounter for immunization: Secondary | ICD-10-CM

## 2017-04-13 NOTE — Patient Instructions (Addendum)
   Call me if you have any new breathing problems or questions before your next appointment.  I will see you back in 6 months or sooner if needed.
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
## 2017-04-26 ENCOUNTER — Ambulatory Visit (HOSPITAL_BASED_OUTPATIENT_CLINIC_OR_DEPARTMENT_OTHER): Payer: Medicare Other

## 2017-04-26 ENCOUNTER — Ambulatory Visit (HOSPITAL_BASED_OUTPATIENT_CLINIC_OR_DEPARTMENT_OTHER): Payer: Medicare Other | Admitting: Oncology

## 2017-04-26 ENCOUNTER — Ambulatory Visit: Payer: Medicare Other | Admitting: Family Medicine

## 2017-04-26 ENCOUNTER — Other Ambulatory Visit (HOSPITAL_BASED_OUTPATIENT_CLINIC_OR_DEPARTMENT_OTHER): Payer: Medicare Other

## 2017-04-26 ENCOUNTER — Telehealth: Payer: Self-pay | Admitting: Oncology

## 2017-04-26 VITALS — BP 132/85 | HR 79 | Temp 98.5°F | Resp 18 | Ht 75.0 in | Wt 179.2 lb

## 2017-04-26 DIAGNOSIS — R300 Dysuria: Secondary | ICD-10-CM | POA: Diagnosis not present

## 2017-04-26 DIAGNOSIS — C3491 Malignant neoplasm of unspecified part of right bronchus or lung: Secondary | ICD-10-CM

## 2017-04-26 DIAGNOSIS — C7A090 Malignant carcinoid tumor of the bronchus and lung: Secondary | ICD-10-CM | POA: Diagnosis not present

## 2017-04-26 DIAGNOSIS — R350 Frequency of micturition: Secondary | ICD-10-CM | POA: Diagnosis not present

## 2017-04-26 DIAGNOSIS — C7B02 Secondary carcinoid tumors of liver: Secondary | ICD-10-CM

## 2017-04-26 LAB — CBC WITH DIFFERENTIAL/PLATELET
BASO%: 0.5 % (ref 0.0–2.0)
Basophils Absolute: 0 10*3/uL (ref 0.0–0.1)
EOS%: 1.2 % (ref 0.0–7.0)
Eosinophils Absolute: 0.1 10*3/uL (ref 0.0–0.5)
HCT: 36.2 % — ABNORMAL LOW (ref 38.4–49.9)
HEMOGLOBIN: 12.1 g/dL — AB (ref 13.0–17.1)
LYMPH#: 1.6 10*3/uL (ref 0.9–3.3)
LYMPH%: 39.7 % (ref 14.0–49.0)
MCH: 28.1 pg (ref 27.2–33.4)
MCHC: 33.4 g/dL (ref 32.0–36.0)
MCV: 84.2 fL (ref 79.3–98.0)
MONO#: 0.4 10*3/uL (ref 0.1–0.9)
MONO%: 9.4 % (ref 0.0–14.0)
NEUT#: 2 10*3/uL (ref 1.5–6.5)
NEUT%: 49.2 % (ref 39.0–75.0)
Platelets: 145 10*3/uL (ref 140–400)
RBC: 4.3 10*6/uL (ref 4.20–5.82)
RDW: 15.7 % — ABNORMAL HIGH (ref 11.0–14.6)
WBC: 4 10*3/uL (ref 4.0–10.3)
nRBC: 0 % (ref 0–0)

## 2017-04-26 LAB — URINALYSIS, MICROSCOPIC - CHCC
BILIRUBIN (URINE): NEGATIVE
Blood: NEGATIVE
Glucose: NEGATIVE mg/dL
KETONES: NEGATIVE mg/dL
Leukocyte Esterase: NEGATIVE
Nitrite: NEGATIVE
Protein: NEGATIVE mg/dL
Specific Gravity, Urine: 1.015 (ref 1.003–1.035)
Urobilinogen, UR: 0.2 mg/dL (ref 0.2–1)
pH: 6 (ref 4.6–8.0)

## 2017-04-26 LAB — COMPREHENSIVE METABOLIC PANEL
ALBUMIN: 3.3 g/dL — AB (ref 3.5–5.0)
ALT: 14 U/L (ref 0–55)
AST: 17 U/L (ref 5–34)
Alkaline Phosphatase: 103 U/L (ref 40–150)
Anion Gap: 10 mEq/L (ref 3–11)
BUN: 24 mg/dL (ref 7.0–26.0)
CHLORIDE: 103 meq/L (ref 98–109)
CO2: 23 meq/L (ref 22–29)
Calcium: 8.8 mg/dL (ref 8.4–10.4)
Creatinine: 1.4 mg/dL — ABNORMAL HIGH (ref 0.7–1.3)
EGFR: 57 mL/min/{1.73_m2} — ABNORMAL LOW (ref >90)
GLUCOSE: 257 mg/dL — AB (ref 70–140)
Potassium: 3.8 mEq/L (ref 3.5–5.1)
SODIUM: 136 meq/L (ref 136–145)
Total Bilirubin: 0.42 mg/dL (ref 0.20–1.20)
Total Protein: 7.3 g/dL (ref 6.4–8.3)

## 2017-04-26 NOTE — Telephone Encounter (Signed)
Gave avs and calendar for September appointment

## 2017-04-27 ENCOUNTER — Encounter: Payer: Self-pay | Admitting: Oncology

## 2017-04-27 ENCOUNTER — Telehealth: Payer: Self-pay | Admitting: *Deleted

## 2017-04-27 DIAGNOSIS — R350 Frequency of micturition: Secondary | ICD-10-CM | POA: Insufficient documentation

## 2017-04-27 LAB — URINE CULTURE: Organism ID, Bacteria: NO GROWTH

## 2017-04-27 NOTE — Assessment & Plan Note (Signed)
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 The patient was started on treatment with Afinitor 10 mg by mouth daily status post 2 months of treatment and has been tolerating the treatment well except for the recent hypersensitivity reaction with swelling of the tongue and neck which was related to the combination of Afinitor and lisinopril. Treatment was placed on hold after 2 months. Afinitor was restarted at 7.5 mg daily on 04/14/2017.  The patient is tolerating his treatment well with no recurrent swelling in the tongue, face, or neck. Recommend that he continue treatment with Afinitor at the current dose.  We will see him back in approximately 2-3 weeks to evaluate how he is tolerating treatment.
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
## 2017-04-27 NOTE — Assessment & Plan Note (Signed)
The patient reports urinary frequency without hematuria or dysuria. Will check a urinalysis and urine culture to evaluate for urinary tract infection. If positive we will contact the patient and treat him for urinary tract infection.

## 2017-04-27 NOTE — Telephone Encounter (Signed)
TCT patient to advise that his U/A results do not indicate that he has a UTI. No treatment indicated at this time. Advised that we would call him next week if the culture itslef comes back positive.  Pt voiced understanding.

## 2017-05-10 ENCOUNTER — Telehealth: Payer: Self-pay | Admitting: Oncology

## 2017-05-10 ENCOUNTER — Ambulatory Visit (HOSPITAL_BASED_OUTPATIENT_CLINIC_OR_DEPARTMENT_OTHER): Payer: Medicare Other | Admitting: Oncology

## 2017-05-10 ENCOUNTER — Encounter: Payer: Self-pay | Admitting: Oncology

## 2017-05-10 ENCOUNTER — Other Ambulatory Visit (HOSPITAL_BASED_OUTPATIENT_CLINIC_OR_DEPARTMENT_OTHER): Payer: Medicare Other

## 2017-05-10 VITALS — BP 145/62 | HR 79 | Temp 97.9°F | Resp 18 | Ht 75.0 in | Wt 183.7 lb

## 2017-05-10 DIAGNOSIS — Z5111 Encounter for antineoplastic chemotherapy: Secondary | ICD-10-CM

## 2017-05-10 DIAGNOSIS — C7B02 Secondary carcinoid tumors of liver: Secondary | ICD-10-CM

## 2017-05-10 DIAGNOSIS — C7A090 Malignant carcinoid tumor of the bronchus and lung: Secondary | ICD-10-CM

## 2017-05-10 DIAGNOSIS — C3491 Malignant neoplasm of unspecified part of right bronchus or lung: Secondary | ICD-10-CM

## 2017-05-10 DIAGNOSIS — E119 Type 2 diabetes mellitus without complications: Secondary | ICD-10-CM

## 2017-05-10 LAB — CBC WITH DIFFERENTIAL/PLATELET
BASO%: 0.2 % (ref 0.0–2.0)
BASOS ABS: 0 10*3/uL (ref 0.0–0.1)
EOS ABS: 0.1 10*3/uL (ref 0.0–0.5)
EOS%: 1.4 % (ref 0.0–7.0)
HCT: 34.6 % — ABNORMAL LOW (ref 38.4–49.9)
HEMOGLOBIN: 11.7 g/dL — AB (ref 13.0–17.1)
LYMPH#: 1.6 10*3/uL (ref 0.9–3.3)
LYMPH%: 36.7 % (ref 14.0–49.0)
MCH: 28.4 pg (ref 27.2–33.4)
MCHC: 33.8 g/dL (ref 32.0–36.0)
MCV: 84 fL (ref 79.3–98.0)
MONO#: 0.4 10*3/uL (ref 0.1–0.9)
MONO%: 9.6 % (ref 0.0–14.0)
NEUT#: 2.2 10*3/uL (ref 1.5–6.5)
NEUT%: 52.1 % (ref 39.0–75.0)
NRBC: 0 % (ref 0–0)
PLATELETS: 209 10*3/uL (ref 140–400)
RBC: 4.12 10*6/uL — ABNORMAL LOW (ref 4.20–5.82)
RDW: 15.3 % — AB (ref 11.0–14.6)
WBC: 4.3 10*3/uL (ref 4.0–10.3)

## 2017-05-10 LAB — COMPREHENSIVE METABOLIC PANEL
ALBUMIN: 3.2 g/dL — AB (ref 3.5–5.0)
ALK PHOS: 98 U/L (ref 40–150)
ALT: 17 U/L (ref 0–55)
ANION GAP: 8 meq/L (ref 3–11)
AST: 21 U/L (ref 5–34)
BILIRUBIN TOTAL: 0.29 mg/dL (ref 0.20–1.20)
BUN: 17 mg/dL (ref 7.0–26.0)
CALCIUM: 8.6 mg/dL (ref 8.4–10.4)
CO2: 24 meq/L (ref 22–29)
CREATININE: 1.4 mg/dL — AB (ref 0.7–1.3)
Chloride: 107 mEq/L (ref 98–109)
EGFR: 55 mL/min/{1.73_m2} — ABNORMAL LOW (ref >90)
Glucose: 247 mg/dl — ABNORMAL HIGH (ref 70–140)
Potassium: 4.1 mEq/L (ref 3.5–5.1)
Sodium: 139 mEq/L (ref 136–145)
Total Protein: 7.1 g/dL (ref 6.4–8.3)

## 2017-05-10 NOTE — Telephone Encounter (Signed)
Scheduled appt per 9/20 los - Gave patient AVS and calender per los. Central radiology to contact patient with ct schedule.

## 2017-05-10 NOTE — Assessment & Plan Note (Signed)
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 The patient was started on treatment with Afinitor 10 mg by mouth daily status post 2 months of treatment and has been tolerating the treatment well except for the recent hypersensitivity reaction with swelling of the tongue and neck which was related to the combination of Afinitor and lisinopril. Treatment was placed on hold after 2 months. Afinitor was restarted at 7.5 mg daily on 04/14/2017.  The patient is tolerating his treatment well with no recurrent swelling in the tongue, face, or neck. Recommend that he continue treatment with Afinitor at the current dose. We will obtain a CT of the chest, abdomen, pelvis and approximately 1 month for restaging.  The patient will return in approximately one month to review his CT scan results.  For the urgency to defecate, he may take Imodium if he develops diarrhea. He was instructed to call and let us know if this happens.

## 2017-05-10 NOTE — Assessment & Plan Note (Signed)
The patient reports that his blood sugars have been running in the mid 200s. He is not currently on any medications for this. I have recommended that he follow up with his primary care provider for further management of his blood sugars.
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
## 2017-05-15 ENCOUNTER — Other Ambulatory Visit: Payer: Self-pay | Admitting: Sports Medicine

## 2017-05-15 ENCOUNTER — Ambulatory Visit (INDEPENDENT_AMBULATORY_CARE_PROVIDER_SITE_OTHER): Payer: Medicare Other | Admitting: Sports Medicine

## 2017-05-15 ENCOUNTER — Ambulatory Visit (INDEPENDENT_AMBULATORY_CARE_PROVIDER_SITE_OTHER): Payer: Medicare Other

## 2017-05-15 ENCOUNTER — Encounter: Payer: Self-pay | Admitting: Sports Medicine

## 2017-05-15 ENCOUNTER — Ambulatory Visit (INDEPENDENT_AMBULATORY_CARE_PROVIDER_SITE_OTHER): Payer: Medicare Other | Admitting: Family Medicine

## 2017-05-15 ENCOUNTER — Encounter: Payer: Self-pay | Admitting: Family Medicine

## 2017-05-15 VITALS — BP 144/76 | HR 77 | Resp 16

## 2017-05-15 VITALS — BP 122/66 | HR 65 | Temp 97.9°F | Ht 75.0 in | Wt 185.4 lb

## 2017-05-15 DIAGNOSIS — I739 Peripheral vascular disease, unspecified: Secondary | ICD-10-CM

## 2017-05-15 DIAGNOSIS — E119 Type 2 diabetes mellitus without complications: Secondary | ICD-10-CM | POA: Diagnosis not present

## 2017-05-15 DIAGNOSIS — M79671 Pain in right foot: Secondary | ICD-10-CM

## 2017-05-15 DIAGNOSIS — C3491 Malignant neoplasm of unspecified part of right bronchus or lung: Secondary | ICD-10-CM | POA: Diagnosis not present

## 2017-05-15 DIAGNOSIS — I10 Essential (primary) hypertension: Secondary | ICD-10-CM | POA: Diagnosis not present

## 2017-05-15 DIAGNOSIS — M25572 Pain in left ankle and joints of left foot: Secondary | ICD-10-CM | POA: Diagnosis not present

## 2017-05-15 DIAGNOSIS — K219 Gastro-esophageal reflux disease without esophagitis: Secondary | ICD-10-CM | POA: Diagnosis not present

## 2017-05-15 MED ORDER — PANTOPRAZOLE SODIUM 40 MG PO TBEC
40.0000 mg | DELAYED_RELEASE_TABLET | Freq: Every day | ORAL | 5 refills | Status: DC
Start: 2017-05-15 — End: 2017-07-05

## 2017-05-15 MED ORDER — METFORMIN HCL 500 MG PO TABS: 500.0000 mg | ORAL_TABLET | Freq: Two times a day (BID) | ORAL | 5 refills | Status: DC

## 2017-05-15 NOTE — Progress Notes (Signed)
Subjective: JEBEDIAH MACRAE is a 71 y.o. male patient who presents to office for evaluation of Left ankle pain since August has noticed more swelling since blood pressure medications have been changed around. Patient also has lung cancer and is in Chemo. States that the pain is throbbing in nature. Has not tried anything for it. Thinks that his new shoes may have also added to the pain in his ankle as well. Reports that the pain in the toe is better.   A1c 6.6  FBS 05-10-17 was 247  Patient Active Problem List   Diagnosis Date Noted  . Urinary frequency 04/27/2017  . Encounter for antineoplastic chemotherapy 01/10/2017  . Goals of care, counseling/discussion 01/10/2017  . Primary cancer of right lung ([place]) 12/22/2016  . Pulmonary emphysema ([place]) 12/01/2016  . PTSD (post-traumatic stress disorder) 12/07/2015  . 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 Prostate cancer screening 04/17/2014  . Erectile dysfunction 04/09/2014  . Arthritis 03/26/2014  . History of adenomatous polyp of colon 03/26/2014  . Tobacco abuse 03/26/2014  . Diabetes mellitus without complication ([place])   . Hypertension   . Gout   . Depression   . GERD (gastroesophageal reflux disease)   . Hyperlipidemia     Current Outpatient Prescriptions on File Prior to Visit  Medication Sig Dispense Refill  . allopurinol (ZYLOPRIM) 100 MG tablet TAKE 1 TABLET DAILY 90 tablet 3  . amLODipine (NORVASC) 5 MG tablet Take 1 tablet (5 mg total) by mouth daily. 30 tablet 5  . aspirin 81 MG tablet Take 1 tablet (81 mg total) by mouth daily. 90 tablet 3  . everolimus (AFINITOR) 7.5 MG tablet Take 1 tablet (7.5 mg total) by mouth daily. 30 tablet 1  . magic mouthwash w/lidocaine SOLN Swish and swallow 5 cc by mouth every six hours for mouth sores. 240 mL 1  . omeprazole (PRILOSEC) 20 MG capsule TAKE 1 CAPSULE DAILY 90 capsule 3  . tadalafil (CIALIS) 10 MG tablet Take 1 tablet (10 mg total) by mouth daily as needed for  erectile dysfunction (every 72 hours maximum). 10 tablet 3  . Tetrahydrozoline HCl (EYE DROPS OP) Place 1 drop into both eyes daily as needed (redness relief).    . vitamin B-12 (CYANOCOBALAMIN) 1000 MCG tablet Take 1,000 mcg by mouth daily.    . vitamin C (ASCORBIC ACID) 500 MG tablet Take 500 mg by mouth daily.    . VOLTAREN 1 % GEL APPLY 2 GRAMS TOPICALLY FOUR TIMES A DAY AS NEEDED FOR LEFT SHOULDER ARTHRITIS 300 g 3   No current facility-administered medications on file prior to visit.     Allergies  Allergen Reactions  . Lisinopril Swelling    Angioedema- on this and afinitor same time  . Simvastatin Other (See Comments)    Joint ache    Objective:  General: Alert and oriented x3 in no acute distress  Dermatology: Small soft hyperkeratotic lesion overlying 2nd PIPJ dorsally on left. No open lesions bilateral lower extremities, no webspace macerations, no ecchymosis bilateral, all nails x 10 are well manicured.  Vascular: Dorsalis Pedis and Posterior Tibial pedal pulses 1/4, Capillary Fill Time 5 seconds,Scant pedal hair growth bilateral, +1 pitting edema bilateral lower extremities L>R, Temperature gradient within normal limits.  Neurology: Johney Maine sensation intact via light touch bilateral.  Musculoskeletal: Diffuse tenderness to swollen left ankle with venous pigmentation, no redness, no warmth no concerns for gout, Semi-flexible hammertoes 2-5 with no tenderness with palpation at PIPJ Left 2nd toe.  No other sympathic pedal deformities noted bilateral. No pain with calf compression bilateral.  Strength within normal limits in all groups bilateral.        Assessment and Plan: Problem List Items Addressed This Visit      Respiratory   Primary cancer of right lung ([place])     Endocrine   Diabetes mellitus without complication ([place])    Other Visit Diagnoses    Acute left ankle pain    -  Primary   Relevant Orders   DG Ankle Complete Left   Foot pain, right       Relevant  Orders   DG Foot Complete Right   PVD (peripheral vascular disease) ([facility])         -Complete examination performed -Discussed treatement options for PVD and swelling at L>R ankle  -Rx compression ankle -Advised patient to speak with PCP or Oncologist re: medications that could cause swelling -Recommend diet modification  -Continue with left 2nd toe padding and encouraged wider shoes to prevent irritation to toe or ankle  -Patient to return to office in 10 weeks for Diabetic nail care or sooner if condition worsens.  Landis Martins, DPM

## 2017-05-15 NOTE — Progress Notes (Signed)
Subjective:  Richard Navarro is a 71 y.o. year old very pleasant male patient who presents for/with See problem oriented charting ROS- some bilateral ankle pain- worse on the left foot. No chest pain. States no difficulty breathign at present. Some edema in both feet- slightly worse on left  Past Medical History-  Patient Active Problem List   Diagnosis Date Noted  . Primary cancer of right lung ([place]) 12/22/2016    Priority: High  . Diabetes mellitus without complication ([place])     Priority: High  . Pulmonary emphysema ([place]) 12/01/2016    Priority: Medium  . PTSD (post-traumatic stress disorder) 12/07/2015    Priority: Medium  . Erectile dysfunction 04/09/2014    Priority: Medium  . Tobacco abuse 03/26/2014    Priority: Medium  . Hypertension     Priority: Medium  . Gout     Priority: Medium  . Depression     Priority: Medium  . Hyperlipidemia     Priority: Medium  . Encounter for antineoplastic chemotherapy 01/10/2017    Priority: Low  . Goals of care, counseling/discussion 01/10/2017    Priority: Low  . CKD (chronic kidney disease), stage II 04/17/2014    Priority: Low  . Prostate cancer screening 04/17/2014    Priority: Low  . Arthritis 03/26/2014    Priority: Low  . History of adenomatous polyp of colon 03/26/2014    Priority: Low  . GERD (gastroesophageal reflux disease)     Priority: Low  . Urinary frequency 04/27/2017    Medications- reviewed and updated Current Outpatient Prescriptions  Medication Sig Dispense Refill  . allopurinol (ZYLOPRIM) 100 MG tablet TAKE 1 TABLET DAILY 90 tablet 3  . amLODipine (NORVASC) 5 MG tablet Take 1 tablet (5 mg total) by mouth daily. 30 tablet 5  . aspirin 81 MG tablet Take 1 tablet (81 mg total) by mouth daily. 90 tablet 3  . everolimus (AFINITOR) 7.5 MG tablet Take 1 tablet (7.5 mg total) by mouth daily. 30 tablet 1  . magic mouthwash w/lidocaine SOLN Swish and swallow 5 cc by mouth every six hours for mouth sores. 240 mL 1   . omeprazole (PRILOSEC) 20 MG capsule TAKE 1 CAPSULE DAILY 90 capsule 3  . tadalafil (CIALIS) 10 MG tablet Take 1 tablet (10 mg total) by mouth daily as needed for erectile dysfunction (every 72 hours maximum). 10 tablet 3  . Tetrahydrozoline HCl (EYE DROPS OP) Place 1 drop into both eyes daily as needed (redness relief).    . vitamin B-12 (CYANOCOBALAMIN) 1000 MCG tablet Take 1,000 mcg by mouth daily.    . vitamin C (ASCORBIC ACID) 500 MG tablet Take 500 mg by mouth daily.    . VOLTAREN 1 % GEL APPLY 2 GRAMS TOPICALLY FOUR TIMES A DAY AS NEEDED FOR LEFT SHOULDER ARTHRITIS 300 g 3     Objective: BP 122/66 (BP Location: Left Arm, Patient Position: Sitting, Cuff Size: Large)   Pulse 65   Temp 97.9 F (36.6 C) (Oral)   Ht 6\' 3"  (1.905 m)   Wt 185 lb 6.4 oz (84.1 kg)   SpO2 97%   BMI 23.17 kg/m  Gen: NAD, resting comfortably No obvious significant hearing difficulties CV: RRR no murmurs rubs or gallops Lungs: CTAB no crackles, wheeze, rhonchi Abdomen: soft/nontender/nondistended/normal bowel sounds. No rebound or guarding.  Ext: trace bilateral edema- slightly worse on left- mild ankle pain Skin: warm, dry  Assessment/Plan:  Getting ankle films with oncology.  Pending at time of visit. Foot  had been swelling on left. Some soreness in foot. None in calf- no swelling in calf either- doubt DVT. Also appears to have had right foot film ordered saw podiatry.   Not eating well- low albumin. States just cant find anything he wants to eat.   Hearing not as good as prior. Declines workup at present  Has had some fecal urgency- we did discuss metformin had potential to worsen this.   Diabetes mellitus without complication S: poorly controlled On no rx- plan was diet/exercise control but chemo seems to be running CBGs up on labs. Has meter to check but not checking Lab Results  Component Value Date   HGBA1C 7.8 03/30/2017   HGBA1C 6.6 (H) 10/24/2016   HGBA1C 6.1 04/25/2016   A/P: we  will start with metformin 500mg  BID (GFR noted at just under 60). CBGs every other morning and update me in 3 weeks- if not closer to 100 would add amaryl 2mg  and follow up in another 3 weeks. Discussed possibility of insulin.   GERD (gastroesophageal reflux disease) S: some burning in chest after meals. Sometimes feels like food is harder to swallow. Compliant with omeprazole 20mg  A/P: trial protonix 40mg . Depending on response, may need to consider GI/EGD- had ne in 2002 showing erosive duodenitis and hiatal hernia  Hypertension S: controlled on amlodipine 5mg  in office today- had been higher at another visit earlier today.  BP Readings from Last 3 Encounters:  05/15/17 122/66  05/15/17 (!) 144/76  05/10/17 (!) 145/62  A/P: We discussed blood pressure goal of <140/90. Continue current meds:  Continue to monitor as long as under 140/90 on our measurements.    Future Appointments Date Time Provider [place]  06/08/2017 2:00 PM [facility] 2 [facility] None  06/11/2017 9:15 AM Curt Bears, MD [facility] None  07/05/2017 2:45 PM [name], MD [facility] None  07/24/2017 2:15 PM Landis Martins, DPM [facility]    Meds ordered this encounter  Medications  . pantoprazole (PROTONIX) 40 MG tablet    Sig: Take 1 tablet (40 mg total) by mouth daily.    Dispense:  30 tablet    Refill:  5  . metFORMIN (GLUCOPHAGE) 500 MG tablet    Sig: Take 1 tablet (500 mg total) by mouth 2 (two) times daily with a meal.    Dispense:  60 tablet    Refill:  5    Return precautions advised.  [name], MD

## 2017-05-15 NOTE — Patient Instructions (Addendum)
Trial protonix 40mg  instead of omeprazole- continue before breakfast  Start metformin 500mg  daily before breakfast for 1 week. Then go to twice a day before meals.   Check blood sugar in morning every other day.   Let me know in about 3 weeks how your morning sugars are doing. May need to add more medication. Hopeful we can get morning sugars down close to 100 but could be up in 200s at this point

## 2017-05-16 NOTE — Assessment & Plan Note (Signed)
S: some burning in chest after meals. Sometimes feels like food is harder to swallow. Compliant with omeprazole 20mg  A/P: trial protonix 40mg . Depending on response, may need to consider GI/EGD- had ne in 2002 showing erosive duodenitis and hiatal hernia

## 2017-05-16 NOTE — Assessment & Plan Note (Signed)
S: poorly controlled On no rx- plan was diet/exercise control but chemo seems to be running CBGs up on labs. Has meter to check but not checking Lab Results  Component Value Date   HGBA1C 7.8 03/30/2017   HGBA1C 6.6 (H) 10/24/2016   HGBA1C 6.1 04/25/2016   A/P: we will start with metformin 500mg  BID (GFR noted at just under 60). CBGs every other morning and update me in 3 weeks- if not closer to 100 would add amaryl 2mg  and follow up in another 3 weeks. Discussed possibility of insulin.

## 2017-05-16 NOTE — Assessment & Plan Note (Signed)
S: controlled on amlodipine 5mg  in office today- had been higher at another visit earlier today.  BP Readings from Last 3 Encounters:  05/15/17 122/66  05/15/17 (!) 144/76  05/10/17 (!) 145/62  A/P: We discussed blood pressure goal of <140/90. Continue current meds:  Continue to monitor as long as under 140/90 on our measurements.

## 2017-05-21 ENCOUNTER — Other Ambulatory Visit: Payer: Self-pay | Admitting: Internal Medicine

## 2017-05-21 DIAGNOSIS — Z7189 Other specified counseling: Secondary | ICD-10-CM

## 2017-05-21 DIAGNOSIS — C3491 Malignant neoplasm of unspecified part of right bronchus or lung: Secondary | ICD-10-CM

## 2017-05-21 DIAGNOSIS — Z5111 Encounter for antineoplastic chemotherapy: Secondary | ICD-10-CM

## 2017-06-08 ENCOUNTER — Ambulatory Visit (HOSPITAL_COMMUNITY)
Admission: RE | Admit: 2017-06-08 | Discharge: 2017-06-08 | Disposition: A | Discharge status: Home / Self Care | Payer: Medicare Other | Source: Ambulatory Visit | Attending: Oncology | Admitting: Oncology

## 2017-06-08 ENCOUNTER — Encounter (HOSPITAL_COMMUNITY): Payer: Self-pay

## 2017-06-08 ENCOUNTER — Other Ambulatory Visit (HOSPITAL_BASED_OUTPATIENT_CLINIC_OR_DEPARTMENT_OTHER): Payer: Medicare Other

## 2017-06-08 DIAGNOSIS — C7A090 Malignant carcinoid tumor of the bronchus and lung: Secondary | ICD-10-CM

## 2017-06-08 DIAGNOSIS — I2510 Atherosclerotic heart disease of native coronary artery without angina pectoris: Secondary | ICD-10-CM | POA: Insufficient documentation

## 2017-06-08 DIAGNOSIS — R599 Enlarged lymph nodes, unspecified: Secondary | ICD-10-CM | POA: Diagnosis not present

## 2017-06-08 DIAGNOSIS — I700 Atherosclerosis of aorta: Secondary | ICD-10-CM | POA: Diagnosis not present

## 2017-06-08 DIAGNOSIS — J9811 Atelectasis: Secondary | ICD-10-CM | POA: Diagnosis not present

## 2017-06-08 DIAGNOSIS — K4090 Unilateral inguinal hernia, without obstruction or gangrene, not specified as recurrent: Secondary | ICD-10-CM | POA: Insufficient documentation

## 2017-06-08 DIAGNOSIS — C3491 Malignant neoplasm of unspecified part of right bronchus or lung: Secondary | ICD-10-CM | POA: Diagnosis not present

## 2017-06-08 DIAGNOSIS — C3490 Malignant neoplasm of unspecified part of unspecified bronchus or lung: Secondary | ICD-10-CM | POA: Diagnosis not present

## 2017-06-08 LAB — CBC WITH DIFFERENTIAL/PLATELET
BASO%: 0.9 % (ref 0.0–2.0)
Basophils Absolute: 0 10*3/uL (ref 0.0–0.1)
EOS ABS: 0 10*3/uL (ref 0.0–0.5)
EOS%: 0.9 % (ref 0.0–7.0)
HCT: 37.8 % — ABNORMAL LOW (ref 38.4–49.9)
HEMOGLOBIN: 12.5 g/dL — AB (ref 13.0–17.1)
LYMPH#: 1.9 10*3/uL (ref 0.9–3.3)
LYMPH%: 45.3 % (ref 14.0–49.0)
MCH: 27.1 pg — AB (ref 27.2–33.4)
MCHC: 33.1 g/dL (ref 32.0–36.0)
MCV: 82.1 fL (ref 79.3–98.0)
MONO#: 0.3 10*3/uL (ref 0.1–0.9)
MONO%: 6.7 % (ref 0.0–14.0)
NEUT%: 46.2 % (ref 39.0–75.0)
NEUTROS ABS: 2 10*3/uL (ref 1.5–6.5)
PLATELETS: 193 10*3/uL (ref 140–400)
RBC: 4.61 10*6/uL (ref 4.20–5.82)
RDW: 16.3 % — AB (ref 11.0–14.6)
WBC: 4.3 10*3/uL (ref 4.0–10.3)

## 2017-06-08 LAB — COMPREHENSIVE METABOLIC PANEL
ALK PHOS: 98 U/L (ref 40–150)
ALT: 23 U/L (ref 0–55)
ANION GAP: 10 meq/L (ref 3–11)
AST: 21 U/L (ref 5–34)
Albumin: 3.7 g/dL (ref 3.5–5.0)
BILIRUBIN TOTAL: 0.24 mg/dL (ref 0.20–1.20)
BUN: 19.9 mg/dL (ref 7.0–26.0)
CO2: 25 mEq/L (ref 22–29)
Calcium: 8.9 mg/dL (ref 8.4–10.4)
Chloride: 102 mEq/L (ref 98–109)
Creatinine: 1.5 mg/dL — ABNORMAL HIGH (ref 0.7–1.3)
EGFR: 51 mL/min/{1.73_m2} — AB (ref >60)
Glucose: 231 mg/dl — ABNORMAL HIGH (ref 70–140)
POTASSIUM: 4.3 meq/L (ref 3.5–5.1)
Sodium: 137 mEq/L (ref 136–145)
TOTAL PROTEIN: 7.6 g/dL (ref 6.4–8.3)

## 2017-06-08 MED ORDER — IOPAMIDOL (ISOVUE-300) INJECTION 61%
INTRAVENOUS | Status: AC
  Filled 2017-06-08: qty 100

## 2017-06-08 MED ORDER — IOPAMIDOL (ISOVUE-300) INJECTION 61%
100.0000 mL | Freq: Once | INTRAVENOUS | Status: AC | PRN
  Administered 2017-06-08: 100 mL via INTRAVENOUS

## 2017-06-11 ENCOUNTER — Telehealth: Payer: Self-pay | Admitting: Internal Medicine

## 2017-06-11 ENCOUNTER — Ambulatory Visit (HOSPITAL_BASED_OUTPATIENT_CLINIC_OR_DEPARTMENT_OTHER): Payer: Medicare Other | Admitting: Internal Medicine

## 2017-06-11 ENCOUNTER — Other Ambulatory Visit: Payer: Self-pay | Admitting: Internal Medicine

## 2017-06-11 ENCOUNTER — Encounter: Payer: Self-pay | Admitting: Internal Medicine

## 2017-06-11 VITALS — BP 143/63 | HR 100 | Temp 98.0°F | Resp 18 | Ht 75.0 in | Wt 185.5 lb

## 2017-06-11 DIAGNOSIS — C7B02 Secondary carcinoid tumors of liver: Secondary | ICD-10-CM

## 2017-06-11 DIAGNOSIS — C3491 Malignant neoplasm of unspecified part of right bronchus or lung: Secondary | ICD-10-CM

## 2017-06-11 DIAGNOSIS — Z5111 Encounter for antineoplastic chemotherapy: Secondary | ICD-10-CM

## 2017-06-11 DIAGNOSIS — C7A090 Malignant carcinoid tumor of the bronchus and lung: Secondary | ICD-10-CM | POA: Diagnosis not present
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
## 2017-06-11 NOTE — Telephone Encounter (Signed)
Gave avs and calendar for November  °

## 2017-06-11 NOTE — Telephone Encounter (Signed)
FAXED RECORDS TO [facility] RE[facility] ID [id number]

## 2017-07-05 ENCOUNTER — Encounter: Payer: Self-pay | Admitting: Family Medicine

## 2017-07-05 ENCOUNTER — Ambulatory Visit (INDEPENDENT_AMBULATORY_CARE_PROVIDER_SITE_OTHER): Payer: Medicare Other | Admitting: Family Medicine

## 2017-07-05 VITALS — BP 112/68 | HR 68 | Temp 98.1°F | Ht 75.0 in | Wt 183.6 lb

## 2017-07-05 DIAGNOSIS — K219 Gastro-esophageal reflux disease without esophagitis: Secondary | ICD-10-CM | POA: Diagnosis not present

## 2017-07-05 DIAGNOSIS — I10 Essential (primary) hypertension: Secondary | ICD-10-CM

## 2017-07-05 DIAGNOSIS — E119 Type 2 diabetes mellitus without complications: Secondary | ICD-10-CM | POA: Diagnosis not present

## 2017-07-05 LAB — POCT GLYCOSYLATED HEMOGLOBIN (HGB A1C): Hemoglobin A1C: 7.2

## 2017-07-05 MED ORDER — PANTOPRAZOLE SODIUM 40 MG PO TBEC: 40.0000 mg | DELAYED_RELEASE_TABLET | Freq: Every day | ORAL | 3 refills | Status: DC

## 2017-07-05 MED ORDER — GLIMEPIRIDE 2 MG PO TABS: 2.0000 mg | ORAL_TABLET | Freq: Every day | ORAL | 5 refills | Status: DC

## 2017-07-05 MED ORDER — METFORMIN HCL 500 MG PO TABS: 500.0000 mg | ORAL_TABLET | Freq: Two times a day (BID) | ORAL | 3 refills | Status: DC

## 2017-07-05 MED ORDER — AMLODIPINE BESYLATE 5 MG PO TABS: 5.0000 mg | ORAL_TABLET | Freq: Every day | ORAL | 3 refills | Status: DC

## 2017-07-05 NOTE — Assessment & Plan Note (Signed)
S: mild poorly controlled on no rx. We started metformin 500mg  BID. Does get some diarrhea on metformin but imodium makes him feel stopped up  CBGs- 175 this morning, ranges from 130 to 190 mostly. Doesn't seem to be affected by foods he eats. One evening check he has was at 138  Lab Results  Component Value Date   HGBA1C 7.2 07/05/2017   HGBA1C 7.8 03/30/2017   HGBA1C 6.6 (H) 10/24/2016   A/P: patient would stongly prefer a1c under 7, so despite improvement asks about starting additional medicatoin. Sent in amaryl 2mg  in addition to metformin and gave hypoglycemia precautions. Would prefer to increase metformin but hold off due to loose stools

## 2017-07-05 NOTE — Assessment & Plan Note (Signed)
S: controlled on amlodipine 5mg   BP Readings from Last 3 Encounters:  07/05/17 112/68  06/11/17 (!) 143/63  05/15/17 122/66  A/P: at goal- blood pressure goal of <140/90. amlodipine likely contributes to edema. Hesitant to change to diuretic with GFR slipping and has Ace-I allergy

## 2017-07-05 NOTE — Patient Instructions (Addendum)
A1c down to 7.2  We will add glimepiride 37m daily. Watch out for low blood sugars on this. If you get low blood sugars below 70 or 80 please let me know and stop glimepiride.   No other changes    Hypoglycemia Hypoglycemia is when the sugar (glucose) level in the blood is too low. Symptoms of low blood sugar may include:  Feeling: ? Hungry. ? Worried or nervous (anxious). ? Sweaty and clammy. ? Confused. ? Dizzy. ? Sleepy. ? Sick to your stomach (nauseous).  Having: ? A fast heartbeat. ? A headache. ? A change in your vision. ? Jerky movements that you cannot control (seizure). ? Nightmares. ? Tingling or no feeling (numbness) around the mouth, lips, or tongue.  Having trouble with: ? Talking. ? Paying attention (concentrating). ? Moving (coordination). ? Sleeping.  Shaking.  Passing out (fainting).  Getting upset easily (irritability).  Low blood sugar can happen to people who have diabetes and people who do not have diabetes. Low blood sugar can happen quickly, and it can be an emergency. Treating Low Blood Sugar Low blood sugar is often treated by eating or drinking something sugary right away. If you can think clearly and swallow safely, follow the 15:15 rule:  Take 15 grams of a fast-acting carb (carbohydrate). Some fast-acting carbs are: ? 1 tube of glucose gel. ? 3 sugar tablets (glucose pills). ? 6-8 pieces of hard candy. ? 4 oz (120 mL) of fruit juice. ? 4 oz (120 mL) of regular (not diet) soda.  Check your blood sugar 15 minutes after you take the carb.  If your blood sugar is still at or below 70 mg/dL (3.9 mmol/L), take 15 grams of a carb again.  If your blood sugar does not go above 70 mg/dL (3.9 mmol/L) after 3 tries, get help right away.  After your blood sugar goes back to normal, eat a meal or a snack within 1 hour.  Treating Very Low Blood Sugar If your blood sugar is at or below 54 mg/dL (3 mmol/L), you have very low blood sugar (severe  hypoglycemia). This is an emergency. Do not wait to see if the symptoms will go away. Get medical help right away. Call your local emergency services (911 in the [place] Do not drive yourself to the hospital. If you have very low blood sugar and you cannot eat or drink, you may need a glucagon shot (injection). A family member or friend should learn how to check your blood sugar and how to give you a glucagon shot. Ask your doctor if you need to have a glucagon shot kit at home. Follow these instructions at home: General instructions  Avoid any diets that cause you to not eat enough food. Talk with your doctor before you start any new diet.  Take over-the-counter and prescription medicines only as told by your doctor.  Limit alcohol to no more than 1 drink per day for nonpregnant women and 2 drinks per day for men. One drink equals 12 oz of beer, 5 oz of wine, or 1 oz of hard liquor.  Keep all follow-up visits as told by your doctor. This is important. If You Have Diabetes:   Make sure you know the symptoms of low blood sugar.  Always keep a source of sugar with you, such as: ? Sugar. ? Sugar tablets. ? Glucose gel. ? Fruit juice. ? Regular soda (not diet soda). ? Milk. ? Hard candy. ? Honey.  Take your medicines as told.  Follow your exercise and meal plan. ? Eat on time. Do not skip meals. ? Follow your sick day plan when you cannot eat or drink normally. Make this plan ahead of time with your doctor.  Check your blood sugar as often as told by your doctor. Always check before and after exercise.  Share your diabetes care plan with: ? Your work or school. ? People you live with.  Check your pee (urine) for ketones: ? When you are sick. ? As told by your doctor.  Carry a card or wear jewelry that says you have diabetes. If You Have Low Blood Sugar From Other Causes:   Check your blood sugar as often as told by your doctor.  Follow instructions from your doctor about  what you cannot eat or drink. Contact a doctor if:  You have trouble keeping your blood sugar in your target range.  You have low blood sugar often. Get help right away if:  You still have symptoms after you eat or drink something sugary.  Your blood sugar is at or below 54 mg/dL (3 mmol/L).  You have jerky movements that you cannot control.  You pass out. These symptoms may be an emergency. Do not wait to see if the symptoms will go away. Get medical help right away. Call your local emergency services (911 in the [place] Do not drive yourself to the hospital. This information is not intended to replace advice given to you by your health care provider. Make sure you discuss any questions you have with your health care provider. Document Released: 11/01/2009 Document Revised: 01/13/2016 Document Reviewed: 09/10/2015 Elsevier Interactive Patient Education  [employer].

## 2017-07-05 NOTE — Progress Notes (Signed)
Subjective:  Richard Navarro is a 71 y.o. year old very pleasant male patient who presents for/with See problem oriented charting ROS- some fatigue, feels less stady on his feet thane he did a year ago, some edema. No chest pain   Past Medical History-  Patient Active Problem List   Diagnosis Date Noted  . Primary cancer of right lung ([place]) 12/22/2016    Priority: High  . Diabetes mellitus without complication ([place])     Priority: High  . Pulmonary emphysema ([place]) 12/01/2016    Priority: Medium  . PTSD (post-traumatic stress disorder) 12/07/2015    Priority: Medium  . Erectile dysfunction 04/09/2014    Priority: Medium  . Tobacco abuse 03/26/2014    Priority: Medium  . Hypertension     Priority: Medium  . Gout     Priority: Medium  . Depression     Priority: Medium  . Hyperlipidemia     Priority: Medium  . Encounter for antineoplastic chemotherapy 01/10/2017    Priority: Low  . Goals of care, counseling/discussion 01/10/2017    Priority: Low  . CKD (chronic kidney disease), stage II 04/17/2014    Priority: Low  . Prostate cancer screening 04/17/2014    Priority: Low  . Arthritis 03/26/2014    Priority: Low  . History of adenomatous polyp of colon 03/26/2014    Priority: Low  . GERD (gastroesophageal reflux disease)     Priority: Low  . Urinary frequency 04/27/2017    Medications- reviewed and updated Current Outpatient Medications  Medication Sig Dispense Refill  . AFINITOR 7.5 MG tablet TAKE 1 TABLET DAILY 30 tablet 1  . allopurinol (ZYLOPRIM) 100 MG tablet TAKE 1 TABLET DAILY 90 tablet 3  . amLODipine (NORVASC) 5 MG tablet Take 1 tablet (5 mg total) daily by mouth. 90 tablet 3  . aspirin 81 MG tablet Take 1 tablet (81 mg total) by mouth daily. 90 tablet 3  . magic mouthwash w/lidocaine SOLN Swish and swallow 5 cc by mouth every six hours for mouth sores. 240 mL 1  . metFORMIN (GLUCOPHAGE) 500 MG tablet Take 1 tablet (500 mg total) 2 (two) times daily with a meal  by mouth. 180 tablet 3  . pantoprazole (PROTONIX) 40 MG tablet Take 1 tablet (40 mg total) daily by mouth. 90 tablet 3  . tadalafil (CIALIS) 10 MG tablet Take 1 tablet (10 mg total) by mouth daily as needed for erectile dysfunction (every 72 hours maximum). 10 tablet 3  . Tetrahydrozoline HCl (EYE DROPS OP) Place 1 drop into both eyes daily as needed (redness relief).    . vitamin B-12 (CYANOCOBALAMIN) 1000 MCG tablet Take 1,000 mcg by mouth daily.    . vitamin C (ASCORBIC ACID) 500 MG tablet Take 500 mg by mouth daily.    . VOLTAREN 1 % GEL APPLY 2 GRAMS TOPICALLY FOUR TIMES A DAY AS NEEDED FOR LEFT SHOULDER ARTHRITIS 300 g 3  . glimepiride (AMARYL) 2 MG tablet Take 1 tablet (2 mg total) daily before breakfast by mouth. 30 tablet 5   No current facility-administered medications for this visit.     Objective: BP 112/68 (BP Location: Left Arm, Patient Position: Sitting, Cuff Size: Large)   Pulse 68   Temp 98.1 F (36.7 C) (Oral)   Ht 6\' 3"  (1.905 m)   Wt 183 lb 9.6 oz (83.3 kg)   SpO2 97%   BMI 22.95 kg/m  Gen: NAD, resting comfortably, well appearing CV: RRR no murmurs rubs or gallops  Lungs: CTAB no crackles, wheeze, rhonchi Abdomen: soft/nontender/nondistended/normal bowel sounds. No rebound or guarding.  Ext: trace edema Skin: warm, dry, no rash  Assessment/Plan:  GFR down to 51 on last check. Still ok for metformin  Some foot swelling -->compression stockings- just moves higher per him- declines to wear them. Since BP controlled on amlodipine also prefers not to stop this  Diabetes mellitus without complication S: mild poorly controlled on no rx. We started metformin 500mg  BID. Does get some diarrhea on metformin but imodium makes him feel stopped up  CBGs- 175 this morning, ranges from 130 to 190 mostly. Doesn't seem to be affected by foods he eats. One evening check he has was at 138  Lab Results  Component Value Date   HGBA1C 7.2 07/05/2017   HGBA1C 7.8 03/30/2017    HGBA1C 6.6 (H) 10/24/2016   A/P: patient would stongly prefer a1c under 7, so despite improvement asks about starting additional medicatoin. Sent in amaryl 2mg  in addition to metformin and gave hypoglycemia precautions. Would prefer to increase metformin but hold off due to loose stools  Hypertension S: controlled on amlodipine 5mg   BP Readings from Last 3 Encounters:  07/05/17 112/68  06/11/17 (!) 143/63  05/15/17 122/66  A/P: at goal- blood pressure goal of <140/90. amlodipine likely contributes to edema. Hesitant to change to diuretic with GFR slipping and has Ace-I allergy  GERD (gastroesophageal reflux disease) S:  last visit decided to trial protonix 40mg  for burning in chest after meals and sometimes feeling like food harder to swallow. Had been on omeprazole 20mg  at that time. He has noted significant improvement- only breakthrough is if he makes poor diet choice like chili which he knows can trigger it. States prior dysphagia with swallowing almost gone A/P:continue protonix 40mg . - doing well   Future Appointments  Date Time Provider [place]  07/17/2017  3:00 PM [facility] 2 [facility] None  07/17/2017  3:30 PM Curt Bears, MD [facility] None  07/24/2017  2:15 PM Landis Martins, DPM [facility]  11/26/2017  3:30 PM Yong Channel, Brayton Mars, MD [facility] None   Orders Placed This Encounter  Procedures  . POCT glycosylated hemoglobin (Hb A1C)   Meds ordered this encounter  Medications  . amLODipine (NORVASC) 5 MG tablet    Sig: Take 1 tablet (5 mg total) daily by mouth.    Dispense:  90 tablet    Refill:  3  . metFORMIN (GLUCOPHAGE) 500 MG tablet    Sig: Take 1 tablet (500 mg total) 2 (two) times daily with a meal by mouth.    Dispense:  180 tablet    Refill:  3  . pantoprazole (PROTONIX) 40 MG tablet    Sig: Take 1 tablet (40 mg total) daily by mouth.    Dispense:  90 tablet    Refill:  3  . glimepiride (AMARYL) 2 MG tablet    Sig: Take 1 tablet  (2 mg total) daily before breakfast by mouth.    Dispense:  30 tablet    Refill:  5    Return precautions advised.  [name], MD

## 2017-07-05 NOTE — Assessment & Plan Note (Signed)
S:  last visit decided to trial protonix 40mg  for burning in chest after meals and sometimes feeling like food harder to swallow. Had been on omeprazole 20mg  at that time. He has noted significant improvement- only breakthrough is if he makes poor diet choice like chili which he knows can trigger it. States prior dysphagia with swallowing almost gone A/P:continue protonix 40mg . - doing well

## 2017-07-17 ENCOUNTER — Encounter: Payer: Self-pay | Admitting: Internal Medicine

## 2017-07-17 ENCOUNTER — Telehealth: Payer: Self-pay | Admitting: Internal Medicine

## 2017-07-17 ENCOUNTER — Other Ambulatory Visit (HOSPITAL_BASED_OUTPATIENT_CLINIC_OR_DEPARTMENT_OTHER): Payer: Medicare Other

## 2017-07-17 ENCOUNTER — Ambulatory Visit (HOSPITAL_BASED_OUTPATIENT_CLINIC_OR_DEPARTMENT_OTHER): Payer: Medicare Other | Admitting: Internal Medicine

## 2017-07-17 VITALS — BP 137/67 | HR 75 | Temp 97.7°F | Resp 20 | Ht 75.0 in | Wt 184.2 lb

## 2017-07-17 DIAGNOSIS — C7A090 Malignant carcinoid tumor of the bronchus and lung: Secondary | ICD-10-CM | POA: Diagnosis not present

## 2017-07-17 DIAGNOSIS — C3491 Malignant neoplasm of unspecified part of right bronchus or lung: Secondary | ICD-10-CM

## 2017-07-17 DIAGNOSIS — C7B02 Secondary carcinoid tumors of liver: Secondary | ICD-10-CM | POA: Diagnosis not present

## 2017-07-17 DIAGNOSIS — Z5111 Encounter for antineoplastic chemotherapy: Secondary | ICD-10-CM

## 2017-07-17 LAB — CBC WITH DIFFERENTIAL/PLATELET
BASO%: 0.8 % (ref 0.0–2.0)
Basophils Absolute: 0 10*3/uL (ref 0.0–0.1)
EOS%: 0.4 % (ref 0.0–7.0)
Eosinophils Absolute: 0 10*3/uL (ref 0.0–0.5)
HEMATOCRIT: 37.1 % — AB (ref 38.4–49.9)
HEMOGLOBIN: 12.3 g/dL — AB (ref 13.0–17.1)
LYMPH#: 1.7 10*3/uL (ref 0.9–3.3)
LYMPH%: 38.5 % (ref 14.0–49.0)
MCH: 26.7 pg — ABNORMAL LOW (ref 27.2–33.4)
MCHC: 33.1 g/dL (ref 32.0–36.0)
MCV: 80.6 fL (ref 79.3–98.0)
MONO#: 0.4 10*3/uL (ref 0.1–0.9)
MONO%: 7.8 % (ref 0.0–14.0)
NEUT%: 52.5 % (ref 39.0–75.0)
NEUTROS ABS: 2.4 10*3/uL (ref 1.5–6.5)
Platelets: 171 10*3/uL (ref 140–400)
RBC: 4.61 10*6/uL (ref 4.20–5.82)
RDW: 15.8 % — AB (ref 11.0–14.6)
WBC: 4.5 10*3/uL (ref 4.0–10.3)

## 2017-07-17 LAB — COMPREHENSIVE METABOLIC PANEL
ALT: 24 U/L (ref 0–55)
AST: 20 U/L (ref 5–34)
Albumin: 3.6 g/dL (ref 3.5–5.0)
Alkaline Phosphatase: 89 U/L (ref 40–150)
Anion Gap: 9 mEq/L (ref 3–11)
BUN: 18.4 mg/dL (ref 7.0–26.0)
CALCIUM: 8.6 mg/dL (ref 8.4–10.4)
CHLORIDE: 103 meq/L (ref 98–109)
CO2: 25 mEq/L (ref 22–29)
CREATININE: 1.5 mg/dL — AB (ref 0.7–1.3)
EGFR: 54 mL/min/{1.73_m2} — ABNORMAL LOW (ref >60)
Glucose: 223 mg/dl — ABNORMAL HIGH (ref 70–140)
Potassium: 3.7 mEq/L (ref 3.5–5.1)
Sodium: 136 mEq/L (ref 136–145)
TOTAL PROTEIN: 7.5 g/dL (ref 6.4–8.3)
Total Bilirubin: 0.27 mg/dL (ref 0.20–1.20)
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
## 2017-07-17 NOTE — Telephone Encounter (Signed)
Gave avs and calendar for December  °

## 2017-07-18 ENCOUNTER — Other Ambulatory Visit: Payer: Self-pay | Admitting: Internal Medicine

## 2017-07-18 DIAGNOSIS — C3491 Malignant neoplasm of unspecified part of right bronchus or lung: Secondary | ICD-10-CM

## 2017-07-18 DIAGNOSIS — Z7189 Other specified counseling: Secondary | ICD-10-CM

## 2017-07-18 DIAGNOSIS — Z5111 Encounter for antineoplastic chemotherapy: Secondary | ICD-10-CM

## 2017-07-24 ENCOUNTER — Encounter: Payer: Self-pay | Admitting: Sports Medicine

## 2017-07-24 ENCOUNTER — Ambulatory Visit (INDEPENDENT_AMBULATORY_CARE_PROVIDER_SITE_OTHER): Payer: Medicare Other | Admitting: Sports Medicine

## 2017-07-24 DIAGNOSIS — E119 Type 2 diabetes mellitus without complications: Secondary | ICD-10-CM

## 2017-07-24 DIAGNOSIS — M79674 Pain in right toe(s): Secondary | ICD-10-CM

## 2017-07-24 DIAGNOSIS — M79675 Pain in left toe(s): Secondary | ICD-10-CM | POA: Diagnosis not present

## 2017-07-24 DIAGNOSIS — I739 Peripheral vascular disease, unspecified: Secondary | ICD-10-CM

## 2017-07-24 DIAGNOSIS — B351 Tinea unguium: Secondary | ICD-10-CM | POA: Diagnosis not present

## 2017-07-24 DIAGNOSIS — C3491 Malignant neoplasm of unspecified part of right bronchus or lung: Secondary | ICD-10-CM

## 2017-07-24 NOTE — Progress Notes (Signed)
Subjective: Richard Navarro is a 71 y.o. male patient returns to office for evaluation of Left ankle pain and for nail trim. Reports that ankle is sore and still swells since after starting chemo. Patient denies any other issues at this time.   Patient Active Problem List   Diagnosis Date Noted  . Urinary frequency 04/27/2017  . Encounter for antineoplastic chemotherapy 01/10/2017  . Goals of care, counseling/discussion 01/10/2017  . Primary cancer of right lung ([place]) 12/22/2016  . Pulmonary emphysema ([place]) 12/01/2016  . PTSD (post-traumatic stress disorder) 12/07/2015  . 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 Prostate cancer screening 04/17/2014  . Erectile dysfunction 04/09/2014  . Arthritis 03/26/2014  . History of adenomatous polyp of colon 03/26/2014  . Tobacco abuse 03/26/2014  . Diabetes mellitus without complication ([place])   . Hypertension   . Gout   . Depression   . GERD (gastroesophageal reflux disease)   . Hyperlipidemia     Current Outpatient Medications on File Prior to Visit  Medication Sig Dispense Refill  . AFINITOR 7.5 MG tablet TAKE 1 TABLET DAILY 30 tablet 1  . allopurinol (ZYLOPRIM) 100 MG tablet TAKE 1 TABLET DAILY 90 tablet 3  . amLODipine (NORVASC) 5 MG tablet Take 1 tablet (5 mg total) daily by mouth. 90 tablet 3  . aspirin 81 MG tablet Take 1 tablet (81 mg total) by mouth daily. 90 tablet 3  . glimepiride (AMARYL) 2 MG tablet Take 1 tablet (2 mg total) daily before breakfast by mouth. 30 tablet 5  . magic mouthwash w/lidocaine SOLN Swish and swallow 5 cc by mouth every six hours for mouth sores. 240 mL 1  . metFORMIN (GLUCOPHAGE) 500 MG tablet Take 1 tablet (500 mg total) 2 (two) times daily with a meal by mouth. 180 tablet 3  . pantoprazole (PROTONIX) 40 MG tablet Take 1 tablet (40 mg total) daily by mouth. 90 tablet 3  . tadalafil (CIALIS) 10 MG tablet Take 1 tablet (10 mg total) by mouth daily as needed for erectile dysfunction (every 72  hours maximum). 10 tablet 3  . Tetrahydrozoline HCl (EYE DROPS OP) Place 1 drop into both eyes daily as needed (redness relief).    . vitamin B-12 (CYANOCOBALAMIN) 1000 MCG tablet Take 1,000 mcg by mouth daily.    . vitamin C (ASCORBIC ACID) 500 MG tablet Take 500 mg by mouth daily.    . VOLTAREN 1 % GEL APPLY 2 GRAMS TOPICALLY FOUR TIMES A DAY AS NEEDED FOR LEFT SHOULDER ARTHRITIS 300 g 3   No current facility-administered medications on file prior to visit.     Allergies  Allergen Reactions  . Lisinopril Swelling    Angioedema- on this and afinitor same time  . Simvastatin Other (See Comments)    Joint ache    Objective:  General: Alert and oriented x3 in no acute distress  Dermatology: No open lesions bilateral lower extremities, no webspace macerations, no ecchymosis bilateral, all nails x 10 are mildly thickened and elongated.   Vascular: Dorsalis Pedis and Posterior Tibial pedal pulses 1/4, Capillary Fill Time 5 seconds,Scant pedal hair growth bilateral, +1 pitting edema bilateral lower extremities L>R, Temperature gradient within normal limits.  Neurology: Johney Maine sensation intact via light touch bilateral.  Musculoskeletal: Diffuse minimal tenderness to swollen left ankle with venous pigmentation, no redness, no warmth no concerns for gout, Semi-flexible hammertoes 2-5 with no tenderness with palpation at PIPJ Left 2nd toe. No other sympathic pedal deformities noted bilateral. No pain  with calf compression bilateral.  Strength within normal limits in all groups bilateral.        Assessment and Plan: Problem List Items Addressed This Visit      Respiratory   Primary cancer of right lung ([place])     Endocrine   Diabetes mellitus without complication ([place]) - Primary    Other Visit Diagnoses    Pain due to onychomycosis of toenails of both feet       PVD (peripheral vascular disease) ([place])         -Complete examination performed -Discussed continued care for PVD and  swelling at L>R ankle  -Continue with compression anklet -Mechanically debrided nails x 10 using sterile nail nipper without incident  -Patient to return to office in 12 weeks for Diabetic nail care or sooner if condition worsens.  Landis Martins, DPM

## 2017-07-25 ENCOUNTER — Telehealth: Payer: Self-pay | Admitting: Family Medicine

## 2017-07-25 NOTE — Telephone Encounter (Signed)
Dr Yong Channel, patients wife came in to office after speaking to [facility]. They are sending her a coupon for a glucometer and suggested she get a prescription for the below test strips. I do not see a prior prescription for any test strips. She also wanted to know how often Mr Axelson is supposed to check his glucose. Please advise on this.

## 2017-07-25 NOTE — Telephone Encounter (Signed)
MEDICATION: Freestyle diabetic test strips    PHARMACY:  [facility] [place] [facility] A 90 DAY SUPPLY : yes   IS PATIENT OUT OF MEDICATION: yes   IF NOT; HOW MUCH IS LEFT:   LAST APPOINTMENT DATE: @11 /15/2018  NEXT APPOINTMENT DATE:@4 /03/2018  OTHER COMMENTS:    **Let patient know to contact pharmacy at the end of the day to make sure medication is ready. **  ** Please notify patient to allow 48-72 hours to process**  **Encourage patient to contact the pharmacy for refills or they can request refills through [facility]**

## 2017-07-25 NOTE — Telephone Encounter (Signed)
You may send in strips and glucometer. I would say twice a day would be reasonable. I mainly want him to have a meter in case his blood sugar gets low but monitoring CBGs up to twice a day may help him with dietary compliance

## 2017-07-26 ENCOUNTER — Other Ambulatory Visit: Payer: Self-pay

## 2017-07-26 ENCOUNTER — Other Ambulatory Visit: Payer: Self-pay | Admitting: *Deleted

## 2017-07-26 MED ORDER — GLUCOSE BLOOD VI STRP: ORAL_STRIP | 3 refills | Status: DC

## 2017-07-26 NOTE — Telephone Encounter (Signed)
Test strips ordered as requested

## 2017-07-27 NOTE — Telephone Encounter (Signed)
Spoke to Richard Navarro and review signs and symptoms of hypoglycemia. Patient states that he has been checking glucose daily and it tends to run in the upper 100's. This mornings reading was 158. Yesterday was 182. 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 He believes his glucose changes are related to the Afinitor that he is taking. I explained to patient that I will relay this message to Dr Yong Channel and if there are any changes I will give him a call. I also asked patient to let office know if he starts seeing higher glucose levels or new signs or symptoms. Patient stated understanding.

## 2017-07-27 NOTE — Telephone Encounter (Signed)
I would say just have him follow up 3 months and 1 day out from last a1c- we can titrate up further if needed. Only exception is if he is regularly seeing #s over 200- then see me sooner Lab Results  Component Value Date   HGBA1C 7.2 07/05/2017

## 2017-07-28 NOTE — Telephone Encounter (Signed)
Tried to contact pt, no answer and no voicemail to leave message.

## 2017-08-03 NOTE — Telephone Encounter (Signed)
Tried to call both numbers listed. The home number was busy both times I tried it and his cell number continued to ring without an answer or voicemail picking up.

## 2017-08-07 NOTE — Telephone Encounter (Signed)
Sent patient a My Chart message advising him of Dr. Ansel Bong recommendations.

## 2017-08-09 ENCOUNTER — Inpatient Hospital Stay (HOSPITAL_COMMUNITY)
Admission: EM | Admit: 2017-08-09 | Discharge: 2017-08-16 | DRG: 871 | Disposition: A | Discharge status: Home / Self Care | Payer: Medicare Other | Attending: Internal Medicine | Admitting: Internal Medicine

## 2017-08-09 ENCOUNTER — Encounter (HOSPITAL_COMMUNITY): Payer: Self-pay | Admitting: Emergency Medicine

## 2017-08-09 ENCOUNTER — Telehealth: Payer: Self-pay | Admitting: Medical Oncology

## 2017-08-09 ENCOUNTER — Other Ambulatory Visit: Payer: Self-pay

## 2017-08-09 ENCOUNTER — Emergency Department (HOSPITAL_COMMUNITY): Payer: Medicare Other

## 2017-08-09 DIAGNOSIS — R05 Cough: Secondary | ICD-10-CM | POA: Diagnosis not present

## 2017-08-09 DIAGNOSIS — J181 Lobar pneumonia, unspecified organism: Secondary | ICD-10-CM | POA: Diagnosis present

## 2017-08-09 DIAGNOSIS — N183 Chronic kidney disease, stage 3 (moderate): Secondary | ICD-10-CM | POA: Diagnosis present

## 2017-08-09 DIAGNOSIS — C7A8 Other malignant neuroendocrine tumors: Secondary | ICD-10-CM

## 2017-08-09 DIAGNOSIS — M109 Gout, unspecified: Secondary | ICD-10-CM | POA: Diagnosis present

## 2017-08-09 DIAGNOSIS — E1159 Type 2 diabetes mellitus with other circulatory complications: Secondary | ICD-10-CM | POA: Diagnosis present

## 2017-08-09 DIAGNOSIS — E1121 Type 2 diabetes mellitus with diabetic nephropathy: Secondary | ICD-10-CM | POA: Diagnosis present

## 2017-08-09 DIAGNOSIS — Z794 Long term (current) use of insulin: Secondary | ICD-10-CM

## 2017-08-09 DIAGNOSIS — Z9842 Cataract extraction status, left eye: Secondary | ICD-10-CM

## 2017-08-09 DIAGNOSIS — E1129 Type 2 diabetes mellitus with other diabetic kidney complication: Secondary | ICD-10-CM

## 2017-08-09 DIAGNOSIS — C3491 Malignant neoplasm of unspecified part of right bronchus or lung: Secondary | ICD-10-CM | POA: Diagnosis not present

## 2017-08-09 DIAGNOSIS — F17210 Nicotine dependence, cigarettes, uncomplicated: Secondary | ICD-10-CM | POA: Diagnosis not present

## 2017-08-09 DIAGNOSIS — J439 Emphysema, unspecified: Secondary | ICD-10-CM | POA: Diagnosis not present

## 2017-08-09 DIAGNOSIS — A419 Sepsis, unspecified organism: Secondary | ICD-10-CM | POA: Diagnosis present

## 2017-08-09 DIAGNOSIS — Z87891 Personal history of nicotine dependence: Secondary | ICD-10-CM

## 2017-08-09 DIAGNOSIS — Z801 Family history of malignant neoplasm of trachea, bronchus and lung: Secondary | ICD-10-CM | POA: Diagnosis not present

## 2017-08-09 DIAGNOSIS — Z8249 Family history of ischemic heart disease and other diseases of the circulatory system: Secondary | ICD-10-CM

## 2017-08-09 DIAGNOSIS — Z9841 Cataract extraction status, right eye: Secondary | ICD-10-CM

## 2017-08-09 DIAGNOSIS — E785 Hyperlipidemia, unspecified: Secondary | ICD-10-CM | POA: Diagnosis present

## 2017-08-09 DIAGNOSIS — D899 Disorder involving the immune mechanism, unspecified: Secondary | ICD-10-CM | POA: Diagnosis present

## 2017-08-09 DIAGNOSIS — I152 Hypertension secondary to endocrine disorders: Secondary | ICD-10-CM | POA: Diagnosis present

## 2017-08-09 DIAGNOSIS — E1122 Type 2 diabetes mellitus with diabetic chronic kidney disease: Secondary | ICD-10-CM | POA: Diagnosis not present

## 2017-08-09 DIAGNOSIS — R6520 Severe sepsis without septic shock: Secondary | ICD-10-CM | POA: Diagnosis not present

## 2017-08-09 DIAGNOSIS — Z66 Do not resuscitate: Secondary | ICD-10-CM | POA: Diagnosis not present

## 2017-08-09 DIAGNOSIS — Z800 Family history of malignant neoplasm of digestive organs: Secondary | ICD-10-CM

## 2017-08-09 DIAGNOSIS — Z833 Family history of diabetes mellitus: Secondary | ICD-10-CM | POA: Diagnosis not present

## 2017-08-09 DIAGNOSIS — E119 Type 2 diabetes mellitus without complications: Secondary | ICD-10-CM

## 2017-08-09 DIAGNOSIS — J189 Pneumonia, unspecified organism: Secondary | ICD-10-CM

## 2017-08-09 DIAGNOSIS — I10 Essential (primary) hypertension: Secondary | ICD-10-CM | POA: Diagnosis present

## 2017-08-09 DIAGNOSIS — R000 Tachycardia, unspecified: Secondary | ICD-10-CM

## 2017-08-09 DIAGNOSIS — Y95 Nosocomial condition: Secondary | ICD-10-CM | POA: Diagnosis present

## 2017-08-09 DIAGNOSIS — E872 Acidosis: Secondary | ICD-10-CM | POA: Diagnosis present

## 2017-08-09 DIAGNOSIS — I129 Hypertensive chronic kidney disease with stage 1 through stage 4 chronic kidney disease, or unspecified chronic kidney disease: Secondary | ICD-10-CM | POA: Diagnosis present

## 2017-08-09 DIAGNOSIS — R0902 Hypoxemia: Secondary | ICD-10-CM | POA: Diagnosis present

## 2017-08-09 DIAGNOSIS — E876 Hypokalemia: Secondary | ICD-10-CM | POA: Diagnosis present

## 2017-08-09 DIAGNOSIS — Z5111 Encounter for antineoplastic chemotherapy: Secondary | ICD-10-CM

## 2017-08-09 DIAGNOSIS — F4310 Post-traumatic stress disorder, unspecified: Secondary | ICD-10-CM | POA: Diagnosis present

## 2017-08-09 DIAGNOSIS — C7B00 Secondary carcinoid tumors, unspecified site: Secondary | ICD-10-CM | POA: Diagnosis present

## 2017-08-09 DIAGNOSIS — Z7189 Other specified counseling: Secondary | ICD-10-CM

## 2017-08-09 DIAGNOSIS — C787 Secondary malignant neoplasm of liver and intrahepatic bile duct: Secondary | ICD-10-CM | POA: Diagnosis not present

## 2017-08-09 DIAGNOSIS — N179 Acute kidney failure, unspecified: Secondary | ICD-10-CM

## 2017-08-09 DIAGNOSIS — K219 Gastro-esophageal reflux disease without esophagitis: Secondary | ICD-10-CM | POA: Diagnosis present

## 2017-08-09 LAB — CBC WITH DIFFERENTIAL/PLATELET
Basophils Absolute: 0 10*3/uL (ref 0.0–0.1)
Basophils Relative: 0 %
Eosinophils Absolute: 0 10*3/uL (ref 0.0–0.7)
Eosinophils Relative: 0 %
HEMATOCRIT: 34.2 % — AB (ref 39.0–52.0)
HEMOGLOBIN: 11.6 g/dL — AB (ref 13.0–17.0)
LYMPHS ABS: 1 10*3/uL (ref 0.7–4.0)
Lymphocytes Relative: 8 %
MCH: 27 pg (ref 26.0–34.0)
MCHC: 33.9 g/dL (ref 30.0–36.0)
MCV: 79.7 fL (ref 78.0–100.0)
Monocytes Absolute: 0.5 10*3/uL (ref 0.1–1.0)
Monocytes Relative: 4 %
NEUTROS ABS: 11 10*3/uL — AB (ref 1.7–7.7)
NEUTROS PCT: 88 %
PLATELETS: 150 10*3/uL (ref 150–400)
RBC: 4.29 MIL/uL (ref 4.22–5.81)
RDW: 15.3 % (ref 11.5–15.5)
WBC: 12.5 10*3/uL — AB (ref 4.0–10.5)

## 2017-08-09 LAB — COMPREHENSIVE METABOLIC PANEL
ALBUMIN: 3.5 g/dL (ref 3.5–5.0)
ALT: 31 U/L (ref 17–63)
ANION GAP: 9 (ref 5–15)
AST: 42 U/L — ABNORMAL HIGH (ref 15–41)
Alkaline Phosphatase: 74 U/L (ref 38–126)
BUN: 25 mg/dL — ABNORMAL HIGH (ref 6–20)
CHLORIDE: 103 mmol/L (ref 101–111)
CO2: 22 mmol/L (ref 22–32)
Calcium: 8.1 mg/dL — ABNORMAL LOW (ref 8.9–10.3)
Creatinine, Ser: 1.81 mg/dL — ABNORMAL HIGH (ref 0.61–1.24)
GFR calc non Af Amer: 35 mL/min — ABNORMAL LOW (ref >60)
GFR, EST AFRICAN AMERICAN: 41 mL/min — AB (ref >60)
GLUCOSE: 275 mg/dL — AB (ref 65–99)
Potassium: 3.5 mmol/L (ref 3.5–5.1)
SODIUM: 134 mmol/L — AB (ref 135–145)
Total Bilirubin: 0.7 mg/dL (ref 0.3–1.2)
Total Protein: 6.8 g/dL (ref 6.5–8.1)

## 2017-08-09 LAB — CBC
HEMATOCRIT: 32.5 % — AB (ref 39.0–52.0)
HEMOGLOBIN: 10.9 g/dL — AB (ref 13.0–17.0)
MCH: 26.8 pg (ref 26.0–34.0)
MCHC: 33.5 g/dL (ref 30.0–36.0)
MCV: 80 fL (ref 78.0–100.0)
Platelets: 134 10*3/uL — ABNORMAL LOW (ref 150–400)
RBC: 4.06 MIL/uL — AB (ref 4.22–5.81)
RDW: 15.2 % (ref 11.5–15.5)
WBC: 11.7 10*3/uL — AB (ref 4.0–10.5)

## 2017-08-09 LAB — URINALYSIS, ROUTINE W REFLEX MICROSCOPIC
BILIRUBIN URINE: NEGATIVE
Glucose, UA: 100 mg/dL — AB
Hgb urine dipstick: NEGATIVE
Ketones, ur: NEGATIVE mg/dL
LEUKOCYTES UA: NEGATIVE
NITRITE: NEGATIVE
Protein, ur: NEGATIVE mg/dL
SPECIFIC GRAVITY, URINE: 1.01 (ref 1.005–1.030)
pH: 6 (ref 5.0–8.0)

## 2017-08-09 LAB — I-STAT CG4 LACTIC ACID, ED
LACTIC ACID, VENOUS: 4.29 mmol/L — AB (ref 0.5–1.9)
LACTIC ACID, VENOUS: 2.24 mmol/L — AB (ref 0.5–1.9)
Lactic Acid, Venous: 3.46 mmol/L (ref 0.5–1.9)
Lactic Acid, Venous: 3.08 mmol/L (ref 0.5–1.9)

## 2017-08-09 LAB — PROTIME-INR
INR: 0.95
Prothrombin Time: 12.6 seconds (ref 11.4–15.2)

## 2017-08-09 LAB — GLUCOSE, CAPILLARY: Glucose-Capillary: 113 mg/dL — ABNORMAL HIGH (ref 65–99)

## 2017-08-09 LAB — LACTIC ACID, PLASMA
Lactic Acid, Venous: 2 mmol/L (ref 0.5–1.9)
Lactic Acid, Venous: 1.7 mmol/L (ref 0.5–1.9)

## 2017-08-09 LAB — CREATININE, SERUM
Creatinine, Ser: 1.49 mg/dL — ABNORMAL HIGH (ref 0.61–1.24)
GFR, EST AFRICAN AMERICAN: 52 mL/min — AB (ref >60)
GFR, EST NON AFRICAN AMERICAN: 45 mL/min — AB (ref >60)

## 2017-08-09 LAB — INFLUENZA PANEL BY PCR (TYPE A & B)
Influenza A By PCR: NEGATIVE
Influenza B By PCR: NEGATIVE

## 2017-08-09 LAB — MRSA PCR SCREENING: MRSA BY PCR: NEGATIVE

## 2017-08-09 MED ORDER — DEXTROSE 5 % IV SOLN
2.0000 g | Freq: Once | INTRAVENOUS | Status: AC
  Administered 2017-08-09: 2 g via INTRAVENOUS
  Filled 2017-08-09: qty 2

## 2017-08-09 MED ORDER — HEPARIN SODIUM (PORCINE) 5000 UNIT/ML IJ SOLN
5000.0000 [IU] | Freq: Three times a day (TID) | INTRAMUSCULAR | Status: DC
  Administered 2017-08-09: 5000 [IU] via SUBCUTANEOUS
  Filled 2017-08-09 (×2): qty 1

## 2017-08-09 MED ORDER — ACETAMINOPHEN 650 MG RE SUPP: 650.0000 mg | Freq: Four times a day (QID) | RECTAL | Status: DC | PRN

## 2017-08-09 MED ORDER — VANCOMYCIN HCL IN DEXTROSE 1-5 GM/200ML-% IV SOLN: 1000.0000 mg | INTRAVENOUS | Status: DC

## 2017-08-09 MED ORDER — SODIUM CHLORIDE 0.9 % IV BOLUS (SEPSIS)
1000.0000 mL | Freq: Once | INTRAVENOUS | Status: AC
  Administered 2017-08-09: 1000 mL via INTRAVENOUS

## 2017-08-09 MED ORDER — DEXTROSE 5 % IV SOLN
2.0000 g | Freq: Two times a day (BID) | INTRAVENOUS | Status: DC
  Administered 2017-08-10 – 2017-08-11 (×3): 2 g via INTRAVENOUS
  Filled 2017-08-09 (×4): qty 2

## 2017-08-09 MED ORDER — INSULIN ASPART 100 UNIT/ML ~~LOC~~ SOLN
0.0000 [IU] | Freq: Three times a day (TID) | SUBCUTANEOUS | Status: DC
  Administered 2017-08-10 – 2017-08-11 (×4): 2 [IU] via SUBCUTANEOUS
  Administered 2017-08-11: 3 [IU] via SUBCUTANEOUS
  Administered 2017-08-12: 7 [IU] via SUBCUTANEOUS
  Administered 2017-08-12 (×2): 2 [IU] via SUBCUTANEOUS
  Administered 2017-08-13: 7 [IU] via SUBCUTANEOUS
  Administered 2017-08-13 – 2017-08-14 (×5): 2 [IU] via SUBCUTANEOUS
  Administered 2017-08-15: 5 [IU] via SUBCUTANEOUS
  Administered 2017-08-15: 2 [IU] via SUBCUTANEOUS
  Administered 2017-08-16: 1 [IU] via SUBCUTANEOUS

## 2017-08-09 MED ORDER — ACETAMINOPHEN 325 MG PO TABS
650.0000 mg | ORAL_TABLET | Freq: Four times a day (QID) | ORAL | Status: DC | PRN
  Administered 2017-08-10: 650 mg via ORAL
  Filled 2017-08-09: qty 2

## 2017-08-09 MED ORDER — SODIUM CHLORIDE 0.9 % IV SOLN
INTRAVENOUS | Status: DC
Start: 2017-08-09 — End: 2017-08-10
  Administered 2017-08-09: 16:00:00 via INTRAVENOUS

## 2017-08-09 MED ORDER — PANTOPRAZOLE SODIUM 40 MG PO TBEC
40.0000 mg | DELAYED_RELEASE_TABLET | Freq: Every day | ORAL | Status: DC
Start: 2017-08-10 — End: 2017-08-16
  Administered 2017-08-10 – 2017-08-16 (×7): 40 mg via ORAL
  Filled 2017-08-09 (×7): qty 1

## 2017-08-09 MED ORDER — TETRAHYDROZOLINE HCL 0.05 % OP SOLN: 2.0000 [drp] | Freq: Every day | OPHTHALMIC | Status: DC | PRN

## 2017-08-09 MED ORDER — SODIUM CHLORIDE 0.9 % IV BOLUS (SEPSIS)
500.0000 mL | Freq: Once | INTRAVENOUS | Status: AC
  Administered 2017-08-09: 500 mL via INTRAVENOUS

## 2017-08-09 MED ORDER — VITAMIN B-12 1000 MCG PO TABS
1000.0000 ug | ORAL_TABLET | Freq: Every day | ORAL | Status: DC
  Administered 2017-08-10 – 2017-08-16 (×7): 1000 ug via ORAL
  Filled 2017-08-09 (×7): qty 1

## 2017-08-09 MED ORDER — ASPIRIN 81 MG PO TABS: 81.0000 mg | ORAL_TABLET | Freq: Every day | ORAL | Status: DC

## 2017-08-09 MED ORDER — DICLOFENAC SODIUM 1 % TD GEL
2.0000 g | Freq: Four times a day (QID) | TRANSDERMAL | Status: DC
  Administered 2017-08-12 – 2017-08-13 (×6): 2 g via TOPICAL
  Filled 2017-08-09: qty 100

## 2017-08-09 MED ORDER — ASPIRIN 81 MG PO CHEW
81.0000 mg | CHEWABLE_TABLET | Freq: Every day | ORAL | Status: DC
  Administered 2017-08-10 – 2017-08-16 (×7): 81 mg via ORAL
  Filled 2017-08-09 (×7): qty 1

## 2017-08-09 MED ORDER — INSULIN ASPART 100 UNIT/ML ~~LOC~~ SOLN
0.0000 [IU] | Freq: Every day | SUBCUTANEOUS | Status: DC
  Administered 2017-08-11: 2 [IU] via SUBCUTANEOUS
  Administered 2017-08-12 – 2017-08-13 (×2): 3 [IU] via SUBCUTANEOUS

## 2017-08-09 MED ORDER — VANCOMYCIN HCL 10 G IV SOLR
1500.0000 mg | Freq: Once | INTRAVENOUS | Status: AC
  Administered 2017-08-09: 1500 mg via INTRAVENOUS
  Filled 2017-08-09: qty 1500

## 2017-08-09 MED ORDER — ACETAMINOPHEN 325 MG PO TABS
650.0000 mg | ORAL_TABLET | Freq: Once | ORAL | Status: AC
  Administered 2017-08-09: 650 mg via ORAL
  Filled 2017-08-09: qty 2

## 2017-08-09 MED ORDER — VITAMIN C 500 MG PO TABS
500.0000 mg | ORAL_TABLET | Freq: Every day | ORAL | Status: DC
  Administered 2017-08-10 – 2017-08-16 (×7): 500 mg via ORAL
  Filled 2017-08-09 (×7): qty 1

## 2017-08-09 MED ORDER — ALLOPURINOL 100 MG PO TABS
100.0000 mg | ORAL_TABLET | Freq: Every day | ORAL | Status: DC
  Administered 2017-08-10 – 2017-08-16 (×7): 100 mg via ORAL
  Filled 2017-08-09 (×7): qty 1
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
## 2017-08-09 NOTE — ED Notes (Signed)
Assigned 1231 @15 :33 call report @ 15:53

## 2017-08-09 NOTE — Telephone Encounter (Signed)
Past 2 days pt has had fever ( 102,101), chills , shaking, severe cough. She reports his blood sugars are in the 280's, "He does not feel well". I instructed [name] to get him to [facility] now. She may call EMS if he cannot get in the car. He took his usual meds this am except his Affinitor ( chemo pill).

## 2017-08-09 NOTE — Progress Notes (Signed)
Pharmacy Antibiotic Note  Richard Navarro is a 71 y.o. male with hx lung cancer on afinitir PTA,  presented to the ED on 08/09/2017 with c/o fever, chills and cough.  CXR showed findings consistent with PNA. To start vancomycin and cefepime for infection.  - scr 1.81 (crcl~42), LA 4.29, ANC 11  Plan: - cefepime 2gm IV q12h - vancomycin 1500 mg IV x1 given in ED, then 1000 mg IV q24h for est AUC 480 (goal 400-500) - daily scr - monitor renal function closely ________________________________________  Height: 6\' 3"  (190.5 cm) Weight: 180 lb (81.6 kg) IBW/kg (Calculated) : 84.5  Temp (24hrs), Avg:98.4 F (36.9 C), Min:98.4 F (36.9 C), Max:98.4 F (36.9 C)  Recent Labs  Lab 08/09/17 1250 08/09/17 1303 08/09/17 1408 08/09/17 1526  WBC 12.5*  --   --   --   CREATININE 1.81*  --   --   --   LATICACIDVEN  --  3.46* 3.08* 4.29*    Estimated Creatinine Clearance: 42 mL/min (A) (by C-G formula based on SCr of 1.81 mg/dL (H)).    Allergies  Allergen Reactions  . Lisinopril Swelling    Angioedema- on this and afinitor same time  . Simvastatin Other (See Comments)    Joint ache     Thank you for allowing pharmacy to be a part of this patient's care.  Lynelle Doctor 08/09/2017 4:05 PM

## 2017-08-09 NOTE — ED Triage Notes (Signed)
Pt complaint of cough and fever onset this am. Hx of lung cancer; takes oral chemotherapy for same.

## 2017-08-09 NOTE — ED Provider Notes (Signed)
[place] of shaking chills onset 3 AM today and worsening cough since last night.  No treatment prior to coming here.  Exam alert Glasgow Coma Score 15 appears comfortable, speaks in paragraphs.  Lungs clear to auscultation Chest x-ray viewed by me   Orlie Dakin, MD 08/09/17 [phone]

## 2017-08-09 NOTE — Progress Notes (Signed)
Pharmacy Note   A consult was received from an ED physician for vancomycin per pharmacy dosing.  The patient's profile has been reviewed for ht/wt/allergies/indication/available labs.   A one time order has been placed for vancomycin 1500 mg IV x1 .     Further antibiotics/pharmacy consults should be ordered by admitting physician if indicated.                       Thank you,  Royetta Asal, PharmD, BCPS Pager [phone] 08/09/2017 1:41 PM
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
## 2017-08-09 NOTE — Telephone Encounter (Addendum)
Pt admitted with Pneumonia-family asks to notify [facility]. Done
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
## 2017-08-10 LAB — URINE CULTURE

## 2017-08-10 LAB — GLUCOSE, CAPILLARY
GLUCOSE-CAPILLARY: 110 mg/dL — AB (ref 65–99)
GLUCOSE-CAPILLARY: 182 mg/dL — AB (ref 65–99)
Glucose-Capillary: 193 mg/dL — ABNORMAL HIGH (ref 65–99)
Glucose-Capillary: 190 mg/dL — ABNORMAL HIGH (ref 65–99)

## 2017-08-10 LAB — COMPREHENSIVE METABOLIC PANEL
ALBUMIN: 2.7 g/dL — AB (ref 3.5–5.0)
ALT: 23 U/L (ref 17–63)
AST: 27 U/L (ref 15–41)
Alkaline Phosphatase: 61 U/L (ref 38–126)
Anion gap: 5 (ref 5–15)
BILIRUBIN TOTAL: 0.8 mg/dL (ref 0.3–1.2)
BUN: 17 mg/dL (ref 6–20)
CHLORIDE: 114 mmol/L — AB (ref 101–111)
CO2: 20 mmol/L — ABNORMAL LOW (ref 22–32)
Calcium: 7.2 mg/dL — ABNORMAL LOW (ref 8.9–10.3)
Creatinine, Ser: 1.38 mg/dL — ABNORMAL HIGH (ref 0.61–1.24)
GFR calc Af Amer: 57 mL/min — ABNORMAL LOW (ref >60)
GFR calc non Af Amer: 49 mL/min — ABNORMAL LOW (ref >60)
GLUCOSE: 138 mg/dL — AB (ref 65–99)
POTASSIUM: 3.2 mmol/L — AB (ref 3.5–5.1)
Sodium: 139 mmol/L (ref 135–145)
Total Protein: 5.6 g/dL — ABNORMAL LOW (ref 6.5–8.1)

## 2017-08-10 LAB — CBC
HEMATOCRIT: 30.9 % — AB (ref 39.0–52.0)
Hemoglobin: 10.3 g/dL — ABNORMAL LOW (ref 13.0–17.0)
MCH: 26.6 pg (ref 26.0–34.0)
MCHC: 33.3 g/dL (ref 30.0–36.0)
MCV: 79.8 fL (ref 78.0–100.0)
Platelets: 132 10*3/uL — ABNORMAL LOW (ref 150–400)
RBC: 3.87 MIL/uL — ABNORMAL LOW (ref 4.22–5.81)
RDW: 15.4 % (ref 11.5–15.5)
WBC: 10.6 10*3/uL — ABNORMAL HIGH (ref 4.0–10.5)

## 2017-08-10 LAB — HIV ANTIBODY (ROUTINE TESTING W REFLEX): HIV SCREEN 4TH GENERATION: NONREACTIVE

## 2017-08-10 LAB — MAGNESIUM: Magnesium: 1.6 mg/dL — ABNORMAL LOW (ref 1.7–2.4)

## 2017-08-10 LAB — STREP PNEUMONIAE URINARY ANTIGEN: STREP PNEUMO URINARY ANTIGEN: NEGATIVE

## 2017-08-10 MED ORDER — LACTATED RINGERS IV SOLN
INTRAVENOUS | Status: AC
  Administered 2017-08-10: 08:00:00 via INTRAVENOUS

## 2017-08-10 MED ORDER — PREDNISONE 20 MG PO TABS
40.0000 mg | ORAL_TABLET | Freq: Every day | ORAL | Status: DC
  Administered 2017-08-10 – 2017-08-13 (×4): 40 mg via ORAL
  Filled 2017-08-10 (×4): qty 2

## 2017-08-10 MED ORDER — POTASSIUM CHLORIDE CRYS ER 20 MEQ PO TBCR
40.0000 meq | EXTENDED_RELEASE_TABLET | ORAL | Status: AC
  Administered 2017-08-10 (×2): 40 meq via ORAL
  Filled 2017-08-10 (×2): qty 2

## 2017-08-10 MED ORDER — MAGNESIUM SULFATE 4 GM/100ML IV SOLN
4.0000 g | Freq: Once | INTRAVENOUS | Status: AC
  Administered 2017-08-10: 4 g via INTRAVENOUS
  Filled 2017-08-10: qty 100

## 2017-08-10 NOTE — Progress Notes (Addendum)
PROGRESS NOTE    [name]  VOJ:[id number] DOB: [birth year] DOA: 08/09/2017 PCP: [name], MD    Brief Narrative:  Richard Navarro is Richard Navarro 71 y.o. 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  Assessment & Plan:   Principal Problem:   Pneumonia Active Problems:   Diabetes mellitus without complication (HCC)   Hypertension   Gout   GERD (gastroesophageal reflux disease)   Neuroendocrine carcinoma (HCC)   Tachycardia   AKI (acute kidney injury) ([place])   Sepsis secondary to pneumonia:  Elevated lactate, tachycardia, fever, increased RR, elevated WBC with imaging findings c/w pneumonia.  On oral chemo, immunocompromised.  Curb 65 on admission 3. Fever overnight.  Improved HR and RR, but still tachypneic and mildly tachycardic.  On 4 L .  - S/p ~30 ml/kg bolus, additional 1 L given due to inappropriate rise in lactate after resuscitation, now improved - initially on vanc/cefepime, will narrow to cefepime today with negative MRSA PCR - prednisone 40 mg daily x 5 days  - Sputum cx, urine legionella, urine strep pending - negative influenza - Blood and urine cx pending (UA benign) [ ]  CXR with RLL and possibly RML airspace opacities compatible with pneumonia (rec f/u to resolution)  Stage IV low grade neuroendocrine carcinoma, carcinoid tumor: follows with Dr. [name], discussed with him on 12/20.  Treatment with afinitor 7.5 mg daily.  Will discontinue this for now.  Tachycardia: improved  AKI on CKD stage III: baseline creatinine looks to be around 1.5, improved  Type 2 DM: hold metformin and amaryl.  SSI ordered Continue ASA Not on statin  Gout: continue allopurinol  HTN: hold amlodipine given sepsis  GERD: continue PPI  Hypokalemia:  replete, follow up mag  NAGMA: mild, likely 2/2 NS resuscitation, will switch fluids to LR  DVT prophylaxis: heparin Code Status: DNR Family Communication: ex wife at bedside Disposition Plan: pending improvement   Consultants:   none  Procedures: (Don't include imaging studies which can be auto populated. Include things that cannot be auto populated i.e. Echo, Carotid and venous dopplers, Foley, Bipap, HD, tubes/drains, wound vac, central lines etc)  none  Antimicrobials: (specify start and planned stop date. Auto populated tables are space occupying and do not give end dates) Anti-infectives (From admission, onward)   Start     Dose/Rate Route Frequency Ordered Stop   08/10/17 1500  vancomycin (VANCOCIN) IVPB 1000 mg/200 mL premix     1,000 mg 200 mL/hr over 60 Minutes Intravenous Every 24 hours 08/09/17 1618     08/10/17 0200  ceFEPIme (MAXIPIME) 2 g in dextrose 5 % 50 mL IVPB     2 g 100 mL/hr over 30 Minutes Intravenous Every 12 hours 08/09/17 1618     08/09/17 1345  ceFEPIme (MAXIPIME) 2 g in dextrose 5 % 50 mL IVPB     2 g 100 mL/hr over 30 Minutes Intravenous  Once 08/09/17 1333 08/09/17 1435   08/09/17 1345  vancomycin (VANCOCIN) 1,500 mg in sodium chloride 0.9 % 500 mL IVPB     1,500 mg 250 mL/hr over 120 Minutes Intravenous  Once 08/09/17 1340 08/09/17 1915      Subjective: Feeling better. Persistent cough, sob.  Mucus, yellowish. No CP.  Objective: Vitals:   08/10/17 0400 08/10/17 0500 08/10/17 0600 08/10/17 0700  BP: (!) 137/58 (!) 143/61 Marland Kitchen)  158/66 (!) 147/67  Pulse: 96 88 (!) 101 (!) 103  Resp: (!) 28 (!) 31 (!) 27 (!) 31  Temp:      TempSrc:      SpO2: 94% 90% 91% 91%  Weight:      Height:        Intake/Output Summary (Last 24 hours) at 08/10/2017 0724 Last data filed at 08/10/2017 0600 Gross per 24 hour  Intake 5700 ml  Output 625 ml  Net 5075 ml   Filed Weights   08/09/17 1217 08/09/17 1653  Weight: 81.6 kg (180 lb) 84.8 kg (186 lb 15.2  oz)    Examination:  General exam: Appears calm and comfortable  Respiratory system: Crackles at bases. Cardiovascular system: mild tachycardia to low 100's.  S1 & S2 heard, RRR. No JVD, murmurs, rubs, gallops or clicks. No pedal edema. Gastrointestinal system: Abdomen is nondistended, soft and nontender. No organomegaly or masses felt. Normal bowel sounds heard. Central nervous system: Alert and oriented. No focal neurological deficits. Extremities: moving all extremities equally Skin: No rashes, lesions or ulcers Psychiatry: Judgement and insight appear normal. Mood & affect appropriate.     Data Reviewed: I have personally reviewed following labs and imaging studies  CBC: Recent Labs  Lab 08/09/17 1250 08/09/17 1658 08/10/17 0243  WBC 12.5* 11.7* 10.6*  NEUTROABS 11.0*  --   --   HGB 11.6* 10.9* 10.3*  HCT 34.2* 32.5* 30.9*  MCV 79.7 80.0 79.8  PLT 150 134* 810*   Basic Metabolic Panel: Recent Labs  Lab 08/09/17 1250 08/09/17 1658 08/10/17 0243  NA 134*  --  139  K 3.5  --  3.2*  CL 103  --  114*  CO2 22  --  20*  GLUCOSE 275*  --  138*  BUN 25*  --  17  CREATININE 1.81* 1.49* 1.38*  CALCIUM 8.1*  --  7.2*   GFR: Estimated Creatinine Clearance: 57 mL/min ([place]) (by C-G formula based on SCr of 1.38 mg/dL (H)). Liver Function Tests: Recent Labs  Lab 08/09/17 1250 08/10/17 0243  AST 42* 27  ALT 31 23  ALKPHOS 74 61  BILITOT 0.7 0.8  PROT 6.8 5.6*  ALBUMIN 3.5 2.7*   No results for input(s): LIPASE, AMYLASE in the last 168 hours. No results for input(s): AMMONIA in the last 168 hours. Coagulation Profile: Recent Labs  Lab 08/09/17 1250  INR 0.95   Cardiac Enzymes: No results for input(s): CKTOTAL, CKMB, CKMBINDEX, TROPONINI in the last 168 hours. BNP (last 3 results) No results for input(s): PROBNP in the last 8760 hours. HbA1C: No results for input(s): HGBA1C in the last 72 hours. CBG: Recent Labs  Lab 08/09/17 2229 08/10/17 0721  GLUCAP  113* 110*   Lipid Profile: No results for input(s): CHOL, HDL, LDLCALC, TRIG, CHOLHDL, LDLDIRECT in the last 72 hours. Thyroid Function Tests: No results for input(s): TSH, T4TOTAL, FREET4, T3FREE, THYROIDAB in the last 72 hours. Anemia Panel: No results for input(s): VITAMINB12, FOLATE, FERRITIN, TIBC, IRON, RETICCTPCT in the last 72 hours. Sepsis Labs: Recent Labs  Lab 08/09/17 1526 08/09/17 1627 08/09/17 1656 08/09/17 1931  LATICACIDVEN 4.29* 2.24* 2.0* 1.7    Recent Results (from the past 240 hour(s))  MRSA PCR Screening     Status: None   Collection Time: 08/09/17  4:53 PM  Result Value Ref Range Status   MRSA by PCR NEGATIVE NEGATIVE Final    Comment:        The GeneXpert MRSA Assay (FDA  approved for NASAL specimens only), is one component of [facility] comprehensive MRSA colonization surveillance program. It is not intended to diagnose MRSA infection nor to guide or monitor treatment for MRSA infections.          Radiology Studies: Dg Chest 2 View  Result Date: 08/09/2017 CLINICAL DATA:  Cough and fever today.  History of lung cancer. EXAM: CHEST  2 VIEW COMPARISON:  06/08/2017 CT, 12/13/2016 radiographs and prior studies FINDINGS: Right lower lobe and possibly right middle lobe airspace opacities likely represent pneumonia. The left lung is clear. There is no evidence of pleural effusion or pneumothorax. The cardiomediastinal silhouette is unchanged with upper limits of normal heart size. No acute bony abnormalities are identified. IMPRESSION: Right lower lobe and possibly right middle lobe airspace opacities compatible with pneumonia. Radiographic follow-up to resolution is recommended. Electronically Signed   By: Margarette Canada M.D.   On: 08/09/2017 12:45        Scheduled Meds: . allopurinol  100 mg Oral Daily  . aspirin  81 mg Oral Daily  . diclofenac sodium  2 g Topical QID  . heparin  5,000 Units Subcutaneous Q8H  . insulin aspart  0-5 Units Subcutaneous QHS    . insulin aspart  0-9 Units Subcutaneous TID WC  . pantoprazole  40 mg Oral Daily  . potassium chloride  40 mEq Oral Q4H  . vitamin B-12  1,000 mcg Oral Daily  . vitamin C  500 mg Oral Daily   Continuous Infusions: . sodium chloride 150 mL/hr at 08/10/17 0600  . ceFEPime (MAXIPIME) IV Stopped (08/10/17 0242)  . vancomycin       LOS: 1 day    Time spent: over 20 min    Fayrene Helper, MD [facility] Pager [phone]  If 7PM-7AM, please contact night-coverage [email or web address] Password [facility] 08/10/2017, 7:24 AM

## 2017-08-10 NOTE — Plan of Care (Signed)
  Progressing Clinical Measurements: Ability to maintain clinical measurements within normal limits will improve 08/10/2017 0321 - Progressing by Orma Render, RN Diagnostic test results will improve 08/10/2017 0321 - Progressing by Orma Render, RN Respiratory complications will improve 08/10/2017 0321 - Progressing by Orma Render, RN Skin Integrity: Risk for impaired skin integrity will decrease 08/10/2017 0321 - Progressing by Leighton Brickley, Coralee Pesa, RN

## 2017-08-10 NOTE — Progress Notes (Signed)
Received pt from ICU/SD in stable condition. Telemetry placed and verified. Pt ambulated to bed, VSs  Not acute distress, IV patent. Will cont to monitor. SRP, RN

## 2017-08-10 NOTE — Care Management Note (Signed)
Case Management Note  Patient Details  Name: Richard Navarro MRN: [id number] Date of Birth: 08/29/1945  Subjective/Objective:                  pna  Action/Plan: Date: August 10, 2017 Velva Harman, BSN, [place], [place] Chart and notes review for patient progress and needs. Will follow for case management and discharge needs. Next review date: [id number]  Expected Discharge Date:  (UNKNOWN)               Expected Discharge Plan:  Home/Self Care  In-House Referral:     Discharge planning Services  CM Consult  Post Acute Care Choice:    Choice offered to:     DME Arranged:    DME Agency:     HH Arranged:    HH Agency:     Status of Service:  In process, will continue to follow  If discussed at Long Length of Stay Meetings, dates discussed:    Additional Comments:  Leeroy Cha, RN 08/10/2017, 7:55 AM

## 2017-08-11 LAB — MAGNESIUM: Magnesium: 2.4 mg/dL (ref 1.7–2.4)

## 2017-08-11 LAB — COMPREHENSIVE METABOLIC PANEL
ALBUMIN: 2.9 g/dL — AB (ref 3.5–5.0)
ALK PHOS: 64 U/L (ref 38–126)
ALT: 21 U/L (ref 17–63)
ANION GAP: 7 (ref 5–15)
AST: 25 U/L (ref 15–41)
BILIRUBIN TOTAL: 0.7 mg/dL (ref 0.3–1.2)
BUN: 14 mg/dL (ref 6–20)
CALCIUM: 8.2 mg/dL — AB (ref 8.9–10.3)
CO2: 20 mmol/L — AB (ref 22–32)
CREATININE: 1.14 mg/dL (ref 0.61–1.24)
Chloride: 108 mmol/L (ref 101–111)
GFR calc non Af Amer: >60 mL/min (ref >60)
GLUCOSE: 144 mg/dL — AB (ref 65–99)
Potassium: 3.4 mmol/L — ABNORMAL LOW (ref 3.5–5.1)
SODIUM: 135 mmol/L (ref 135–145)
TOTAL PROTEIN: 6 g/dL — AB (ref 6.5–8.1)

## 2017-08-11 LAB — EXPECTORATED SPUTUM ASSESSMENT W REFEX TO RESP CULTURE

## 2017-08-11 LAB — CBC
HEMATOCRIT: 30.4 % — AB (ref 39.0–52.0)
HEMOGLOBIN: 10.3 g/dL — AB (ref 13.0–17.0)
MCH: 26.7 pg (ref 26.0–34.0)
MCHC: 33.9 g/dL (ref 30.0–36.0)
MCV: 78.8 fL (ref 78.0–100.0)
Platelets: 133 10*3/uL — ABNORMAL LOW (ref 150–400)
RBC: 3.86 MIL/uL — AB (ref 4.22–5.81)
RDW: 15.5 % (ref 11.5–15.5)
WBC: 11.8 10*3/uL — ABNORMAL HIGH (ref 4.0–10.5)

## 2017-08-11 LAB — GLUCOSE, CAPILLARY
GLUCOSE-CAPILLARY: 232 mg/dL — AB (ref 65–99)
Glucose-Capillary: 152 mg/dL — ABNORMAL HIGH (ref 65–99)
Glucose-Capillary: 177 mg/dL — ABNORMAL HIGH (ref 65–99)
Glucose-Capillary: 208 mg/dL — ABNORMAL HIGH (ref 65–99)

## 2017-08-11 LAB — GRAM STAIN

## 2017-08-11 LAB — EXPECTORATED SPUTUM ASSESSMENT W GRAM STAIN, RFLX TO RESP C

## 2017-08-11 LAB — LEGIONELLA PNEUMOPHILA SEROGP 1 UR AG: L. pneumophila Serogp 1 Ur Ag: NEGATIVE

## 2017-08-11 MED ORDER — POTASSIUM CHLORIDE CRYS ER 20 MEQ PO TBCR
40.0000 meq | EXTENDED_RELEASE_TABLET | Freq: Once | ORAL | Status: AC
  Administered 2017-08-11: 40 meq via ORAL
  Filled 2017-08-11: qty 2

## 2017-08-11 MED ORDER — LEVOFLOXACIN 750 MG PO TABS
750.0000 mg | ORAL_TABLET | Freq: Every day | ORAL | Status: DC
  Administered 2017-08-11 – 2017-08-16 (×6): 750 mg via ORAL
  Filled 2017-08-11 (×6): qty 1

## 2017-08-11 MED ORDER — ENOXAPARIN SODIUM 40 MG/0.4ML ~~LOC~~ SOLN: 40.0000 mg | SUBCUTANEOUS | Status: DC

## 2017-08-11 MED ORDER — LACTATED RINGERS IV SOLN
INTRAVENOUS | Status: DC
  Administered 2017-08-11 – 2017-08-12 (×3): via INTRAVENOUS

## 2017-08-11 NOTE — Progress Notes (Addendum)
PROGRESS NOTE    Richard Navarro  VZC:[id number] DOB: 1946/01/10 DOA: 08/09/2017 PCP: [name], MD    Brief Narrative:  Richard Navarro is a 71 y.o. 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  Assessment & Plan:   Principal Problem:   Pneumonia Active Problems:   Diabetes mellitus without complication (HCC)   Hypertension   Gout   GERD (gastroesophageal reflux disease)   Neuroendocrine carcinoma (HCC)   Tachycardia   AKI (acute kidney injury) ([place])   Sepsis secondary to pneumonia:  Elevated lactate, tachycardia, fever, increased RR, elevated WBC with imaging findings c/w pneumonia.  On oral chemo, immunocompromised.  Curb 65 on admission 3. Sepsis physiology resolved. Still on 4 L [place], wean as tolerated. - initially on vanc 12/20-12/20 and cefepime 12/20-12/22.  Will transition to levaquin (discussed flouroquinolone risk/benefits with pt) today ordered x 5 days. negative MRSA PCR. - prednisone 40 mg daily x 5 days  - Sputum cx pending collection, will discuss with nursing - urine legionella pending, urine strep negative - negative influenza - Blood cx NGTD  - urine cx with multiple species (UA benign) [ ]  CXR with RLL and possibly RML airspace opacities compatible with pneumonia (rec f/u to resolution)  Stage IV low grade neuroendocrine carcinoma, carcinoid tumor: follows with Dr. [name], discussed with him on 12/20.  Treatment with afinitor 7.5 mg daily.  Will discontinue this for now.  Tachycardia: improved  AKI on CKD stage III: baseline creatinine looks to be around 1.5, improved  Type 2 DM: hold metformin and amaryl.  SSI ordered Continue ASA Not on statin  Gout: continue allopurinol  HTN: hold amlodipine given sepsis  GERD:  continue PPI  Hypokalemia: replete, follow up mag  NAGMA: mild, likely 2/2 NS resuscitation, will switch fluids to LR  DVT prophylaxis: SCD's, had been refusing pharm ppx.  Will give humidified air for epistaxis, reevaluate pharm prophylaxis as able.  Code Status: DNR Family Communication: ex wife at bedside Disposition Plan: pending improvement   Consultants:   none  Procedures: (Don't include imaging studies which can be auto populated. Include things that cannot be auto populated i.e. Echo, Carotid and venous dopplers, Foley, Bipap, HD, tubes/drains, wound vac, central lines etc)  none  Antimicrobials: (specify start and planned stop date. Auto populated tables are space occupying and do not give end dates) Anti-infectives (From admission, onward)   Start     Dose/Rate Route Frequency Ordered Stop   08/11/17 1200  levofloxacin (LEVAQUIN) tablet 750 mg     750 mg Oral Daily 08/11/17 1122     08/10/17 1500  vancomycin (VANCOCIN) IVPB 1000 mg/200 mL premix  Status:  Discontinued     1,000 mg 200 mL/hr over 60 Minutes Intravenous Every 24 hours 08/09/17 1618 08/10/17 0743   08/10/17 0200  ceFEPIme (MAXIPIME) 2 g in dextrose 5 % 50 mL IVPB  Status:  Discontinued     2 g 100 mL/hr over 30 Minutes Intravenous Every 12 hours 08/09/17 1618 08/11/17 1122   08/09/17 1345  ceFEPIme (MAXIPIME) 2 g in dextrose 5 % 50 mL IVPB     2 g 100 mL/hr over 30 Minutes Intravenous  Once 08/09/17 1333 08/09/17 1435   08/09/17 1345  vancomycin (VANCOCIN) 1,500 mg in sodium chloride 0.9 % 500 mL IVPB     1,500 mg  250 mL/hr over 120 Minutes Intravenous  Once 08/09/17 1340 08/09/17 1915      Subjective: Still SOB, persistent cough productive of mucus. Bloodly R nare.  Objective: Vitals:   08/10/17 1700 08/10/17 1841 08/10/17 2110 08/11/17 0502  BP: (!) 160/75 (!) 159/73 136/74 136/63  Pulse: 98 95 93 86  Resp: (!) 29 20 20 20   Temp:  98.7 F (37.1 C) 98.1 F (36.7 C) 98.9 F (37.2 C)    TempSrc:  Oral Oral Oral  SpO2: 96% 100% 99% 93%  Weight:      Height:        Intake/Output Summary (Last 24 hours) at 08/11/2017 1131 Last data filed at 08/11/2017 0700 Gross per 24 hour  Intake 2850 ml  Output 280 ml  Net 2570 ml   Filed Weights   08/09/17 1217 08/09/17 1653  Weight: 81.6 kg (180 lb) 84.8 kg (186 lb 15.2 oz)    Examination:  General: No acute distress. Cardiovascular: Heart sounds show a regular rate, and rhythm. No gallops or rubs. No murmurs. No JVD. Lungs: No increased WOB.  Clear bilaterally. Abdomen: Soft, nontender, nondistended with normal active bowel sounds. No masses. No hepatosplenomegaly. Neurological: Alert and oriented 3. Moves all extremities 4 with equal strength. Cranial nerves II through XII grossly intact. Skin: Warm and dry. No rashes or lesions. Extremities: No clubbing or cyanosis. No edema.  Psychiatric: Mood and affect are normal. Insight and judgment are appropriate.   Data Reviewed: I have personally reviewed following labs and imaging studies  CBC: Recent Labs  Lab 08/09/17 1250 08/09/17 1658 08/10/17 0243 08/11/17 0524  WBC 12.5* 11.7* 10.6* 11.8*  NEUTROABS 11.0*  --   --   --   HGB 11.6* 10.9* 10.3* 10.3*  HCT 34.2* 32.5* 30.9* 30.4*  MCV 79.7 80.0 79.8 78.8  PLT 150 134* 132* 086*   Basic Metabolic Panel: Recent Labs  Lab 08/09/17 1250 08/09/17 1658 08/10/17 0243 08/11/17 0524  NA 134*  --  139 135  K 3.5  --  3.2* 3.4*  CL 103  --  114* 108  CO2 22  --  20* 20*  GLUCOSE 275*  --  138* 144*  BUN 25*  --  17 14  CREATININE 1.81* 1.49* 1.38* 1.14  CALCIUM 8.1*  --  7.2* 8.2*  MG  --   --  1.6* 2.4   GFR: Estimated Creatinine Clearance: 69 mL/min (by C-G formula based on SCr of 1.14 mg/dL). Liver Function Tests: Recent Labs  Lab 08/09/17 1250 08/10/17 0243 08/11/17 0524  AST 42* 27 25  ALT 31 23 21   ALKPHOS 74 61 64  BILITOT 0.7 0.8 0.7  PROT 6.8 5.6* 6.0*  ALBUMIN 3.5 2.7* 2.9*   No  results for input(s): LIPASE, AMYLASE in the last 168 hours. No results for input(s): AMMONIA in the last 168 hours. Coagulation Profile: Recent Labs  Lab 08/09/17 1250  INR 0.95   Cardiac Enzymes: No results for input(s): CKTOTAL, CKMB, CKMBINDEX, TROPONINI in the last 168 hours. BNP (last 3 results) No results for input(s): PROBNP in the last 8760 hours. HbA1C: No results for input(s): HGBA1C in the last 72 hours. CBG: Recent Labs  Lab 08/10/17 0721 08/10/17 1114 08/10/17 1520 08/10/17 2118 08/11/17 0830  GLUCAP 110* 182* 193* 190* 152*   Lipid Profile: No results for input(s): CHOL, HDL, LDLCALC, TRIG, CHOLHDL, LDLDIRECT in the last 72 hours. Thyroid Function Tests: No results for input(s): TSH, T4TOTAL, FREET4, T3FREE, THYROIDAB in the  last 72 hours. Anemia Panel: No results for input(s): VITAMINB12, FOLATE, FERRITIN, TIBC, IRON, RETICCTPCT in the last 72 hours. Sepsis Labs: Recent Labs  Lab 08/09/17 1526 08/09/17 1627 08/09/17 1656 08/09/17 1931  LATICACIDVEN 4.29* 2.24* 2.0* 1.7    Recent Results (from the past 240 hour(s))  Culture, blood (Routine x 2)     Status: None (Preliminary result)   Collection Time: 08/09/17 12:50 PM  Result Value Ref Range Status   Specimen Description BLOOD LEFT ANTECUBITAL  Final   Special Requests   Final    BOTTLES DRAWN AEROBIC AND ANAEROBIC Blood Culture adequate volume   Culture   Final    NO GROWTH < 24 HOURS Performed at [facility], [place] [address] [place], [place] [address]    Report Status PENDING  Incomplete  Culture, blood (Routine x 2)     Status: None (Preliminary result)   Collection Time: 08/09/17  1:23 PM  Result Value Ref Range Status   Specimen Description BLOOD LEFT HAND  Final   Special Requests   Final    BOTTLES DRAWN AEROBIC AND ANAEROBIC Blood Culture adequate volume   Culture   Final    NO GROWTH < 24 HOURS Performed at [facility], [place] [address] [place], [place] [address]     Report Status PENDING  Incomplete  Urine culture     Status: Abnormal   Collection Time: 08/09/17  3:07 PM  Result Value Ref Range Status   Specimen Description URINE, CLEAN CATCH  Final   Special Requests NONE  Final   Culture MULTIPLE SPECIES PRESENT, SUGGEST RECOLLECTION (A)  Final   Report Status 08/10/2017 FINAL  Final  MRSA PCR Screening     Status: None   Collection Time: 08/09/17  4:53 PM  Result Value Ref Range Status   MRSA by PCR NEGATIVE NEGATIVE Final    Comment:        The GeneXpert MRSA Assay (FDA approved for NASAL specimens only), is one component of a comprehensive MRSA colonization surveillance program. It is not intended to diagnose MRSA infection nor to guide or monitor treatment for MRSA infections.          Radiology Studies: Dg Chest 2 View  Result Date: 08/09/2017 CLINICAL DATA:  Cough and fever today.  History of lung cancer. EXAM: CHEST  2 VIEW COMPARISON:  06/08/2017 CT, 12/13/2016 radiographs and prior studies FINDINGS: Right lower lobe and possibly right middle lobe airspace opacities likely represent pneumonia. The left lung is clear. There is no evidence of pleural effusion or pneumothorax. The cardiomediastinal silhouette is unchanged with upper limits of normal heart size. No acute bony abnormalities are identified. IMPRESSION: Right lower lobe and possibly right middle lobe airspace opacities compatible with pneumonia. Radiographic follow-up to resolution is recommended. Electronically Signed   By: Margarette Canada M.D.   On: 08/09/2017 12:45        Scheduled Meds: . allopurinol  100 mg Oral Daily  . aspirin  81 mg Oral Daily  . diclofenac sodium  2 g Topical QID  . heparin  5,000 Units Subcutaneous Q8H  . insulin aspart  0-5 Units Subcutaneous QHS  . insulin aspart  0-9 Units Subcutaneous TID WC  . levofloxacin  750 mg Oral Daily  . pantoprazole  40 mg Oral Daily  . potassium chloride  40 mEq Oral Once  . predniSONE  40 mg Oral Q  breakfast  . vitamin B-12  1,000 mcg Oral Daily  .  vitamin C  500 mg Oral Daily   Continuous Infusions:    LOS: 2 days    Time spent: over 20 min    Fayrene Helper, MD [facility] Pager [phone]  If 7PM-7AM, please contact night-coverage [email or web address] Password [facility] 08/11/2017, 11:31 AM

## 2017-08-12 LAB — CBC
HEMATOCRIT: 30.3 % — AB (ref 39.0–52.0)
HEMOGLOBIN: 10.1 g/dL — AB (ref 13.0–17.0)
MCH: 26.2 pg (ref 26.0–34.0)
MCHC: 33.3 g/dL (ref 30.0–36.0)
MCV: 78.7 fL (ref 78.0–100.0)
PLATELETS: 129 10*3/uL — AB (ref 150–400)
RBC: 3.85 MIL/uL — AB (ref 4.22–5.81)
RDW: 15.2 % (ref 11.5–15.5)
WBC: 9.9 10*3/uL (ref 4.0–10.5)

## 2017-08-12 LAB — COMPREHENSIVE METABOLIC PANEL
ALK PHOS: 58 U/L (ref 38–126)
ALT: 24 U/L (ref 17–63)
AST: 25 U/L (ref 15–41)
Albumin: 2.5 g/dL — ABNORMAL LOW (ref 3.5–5.0)
Anion gap: 5 (ref 5–15)
BILIRUBIN TOTAL: 0.5 mg/dL (ref 0.3–1.2)
BUN: 14 mg/dL (ref 6–20)
CALCIUM: 8.2 mg/dL — AB (ref 8.9–10.3)
CHLORIDE: 108 mmol/L (ref 101–111)
CO2: 24 mmol/L (ref 22–32)
CREATININE: 1.19 mg/dL (ref 0.61–1.24)
GFR, EST NON AFRICAN AMERICAN: 59 mL/min — AB (ref >60)
Glucose, Bld: 208 mg/dL — ABNORMAL HIGH (ref 65–99)
Potassium: 3.6 mmol/L (ref 3.5–5.1)
Sodium: 137 mmol/L (ref 135–145)
TOTAL PROTEIN: 6 g/dL — AB (ref 6.5–8.1)

## 2017-08-12 LAB — GLUCOSE, CAPILLARY
GLUCOSE-CAPILLARY: 176 mg/dL — AB (ref 65–99)
GLUCOSE-CAPILLARY: 160 mg/dL — AB (ref 65–99)
GLUCOSE-CAPILLARY: 312 mg/dL — AB (ref 65–99)
GLUCOSE-CAPILLARY: 260 mg/dL — AB (ref 65–99)

## 2017-08-12 MED ORDER — POTASSIUM CHLORIDE CRYS ER 20 MEQ PO TBCR
40.0000 meq | EXTENDED_RELEASE_TABLET | Freq: Once | ORAL | Status: AC
  Administered 2017-08-12: 40 meq via ORAL
  Filled 2017-08-12: qty 2

## 2017-08-12 NOTE — Progress Notes (Signed)
Patient ID: Richard Navarro, male   DOB: 1945/12/16, 71 y.o.   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  Assessment & Plan:   Sepsis secondary to pneumonia - Elevated lactate, tachycardia, fever, increased RR, elevated WBC with imaging findings c/w pneumonia. On oral chemo, immunocompromised.  Curb 65 on admission 3. - Sepsis physiology resolved. - initially on vanc 12/20-12/20 and cefepime 12/20-12/22.  Will transition to levaquin (day #2 today) - Continue prednisone for 5 days (discussed flouroquinolone risk/benefits with pt)  - Follow up legionella - Strep pneumonia negative - Blood cx negative so far  - Influenza negative  - urine cx with multiple species (UA benign)  Stage IV low grade neuroendocrine carcinoma, carcinoid tumor - Follow up with oncology  AKI on CKD stage III - Monitor renal function daily  Type 2 DM with diabetic nephropathy - Continue SSI  Hypokalemia - Supplemented   NAGMA - Mild - Check lactic acid    DVT prophylaxis: SCD's Code Status: DNR/DNI Family Communication: no family at the bedside  Disposition Plan: not yet stable for discharge    Consultants:   None  Procedures:   None  Antimicrobials:   Levaquin    Subjective: No overnight events.  Objective: Vitals:   08/11/17 1448 08/11/17 1800 08/11/17 2123 08/12/17 0405  BP: 139/78  (!) 145/79 140/71  Pulse: 88  77 76  Resp: 20  18 18   Temp: 99 F (37.2 C)  98.2 F (36.8 C) 98.2 F (36.8 C)  TempSrc: Oral  Oral Oral  SpO2: 93% 98% 98% 94%  Weight:      Height:        Intake/Output Summary (Last  24 hours) at 08/12/2017 1333 Last data filed at 08/12/2017 0600 Gross per 24 hour  Intake 2227.08 ml  Output -  Net 2227.08 ml   Filed Weights   08/09/17 1217 08/09/17 1653  Weight: 81.6 kg (180 lb) 84.8 kg (186 lb 15.2 oz)    Examination:  General exam: Appears calm and comfortable  Respiratory system: Clear to auscultation. Respiratory effort normal. Cardiovascular system: S1 & S2 heard, Rate controlled  Gastrointestinal system: Abdomen is nondistended, soft and nontender. No organomegaly or masses felt. Normal bowel sounds heard. Central nervous system: No focal neurological deficits. Extremities: Symmetric 5 x 5 power. Skin: No rashes, lesions or ulcers Psychiatry: Judgement and insight appear normal. Mood & affect appropriate.   Data Reviewed: I have personally reviewed following labs and imaging studies  CBC: Recent Labs  Lab 08/09/17 1250 08/09/17 1658 08/10/17 0243 08/11/17 0524 08/12/17 0557  WBC 12.5* 11.7* 10.6* 11.8* 9.9  NEUTROABS 11.0*  --   --   --   --   HGB 11.6* 10.9* 10.3* 10.3* 10.1*  HCT 34.2* 32.5* 30.9* 30.4* 30.3*  MCV 79.7 80.0 79.8 78.8 78.7  PLT 150 134* 132* 133* 916*   Basic Metabolic Panel: Recent Labs  Lab 08/09/17 1250 08/09/17 1658 08/10/17 0243 08/11/17 0524 08/12/17 0557  NA 134*  --  139 135 137  K 3.5  --  3.2* 3.4* 3.6  CL 103  --  114* 108 108  CO2 22  --  20* 20* 24  GLUCOSE  275*  --  138* 144* 208*  BUN 25*  --  17 14 14   CREATININE 1.81* 1.49* 1.38* 1.14 1.19  CALCIUM 8.1*  --  7.2* 8.2* 8.2*  MG  --   --  1.6* 2.4  --    GFR: Estimated Creatinine Clearance: 66.1 mL/min (by C-G formula based on SCr of 1.19 mg/dL). Liver Function Tests: Recent Labs  Lab 08/09/17 1250 08/10/17 0243 08/11/17 0524 08/12/17 0557  AST 42* 27 25 25   ALT 31 23 21 24   ALKPHOS 74 61 64 58  BILITOT 0.7 0.8 0.7 0.5  PROT 6.8 5.6* 6.0* 6.0*  ALBUMIN 3.5 2.7* 2.9* 2.5*   No results for input(s): LIPASE, AMYLASE in the last 168  hours. No results for input(s): AMMONIA in the last 168 hours. Coagulation Profile: Recent Labs  Lab 08/09/17 1250  INR 0.95   Cardiac Enzymes: No results for input(s): CKTOTAL, CKMB, CKMBINDEX, TROPONINI in the last 168 hours. BNP (last 3 results) No results for input(s): PROBNP in the last 8760 hours. HbA1C: No results for input(s): HGBA1C in the last 72 hours. CBG: Recent Labs  Lab 08/11/17 1131 08/11/17 1654 08/11/17 2230 08/12/17 0757 08/12/17 1138  GLUCAP 177* 208* 232* 176* 160*   Lipid Profile: No results for input(s): CHOL, HDL, LDLCALC, TRIG, CHOLHDL, LDLDIRECT in the last 72 hours. Thyroid Function Tests: No results for input(s): TSH, T4TOTAL, FREET4, T3FREE, THYROIDAB in the last 72 hours. Anemia Panel: No results for input(s): VITAMINB12, FOLATE, FERRITIN, TIBC, IRON, RETICCTPCT in the last 72 hours. Urine analysis:    Component Value Date/Time   COLORURINE YELLOW 08/09/2017 [place] 08/09/2017 1507   LABSPEC 1.010 08/09/2017 1507   LABSPEC 1.015 04/26/2017 1452   PHURINE 6.0 08/09/2017 1507   GLUCOSEU 100 (A) 08/09/2017 1507   GLUCOSEU Negative 04/26/2017 1452   HGBUR NEGATIVE 08/09/2017 1507   BILIRUBINUR NEGATIVE 08/09/2017 1507   BILIRUBINUR Negative 04/26/2017 1452   KETONESUR NEGATIVE 08/09/2017 1507   PROTEINUR NEGATIVE 08/09/2017 1507   UROBILINOGEN 0.2 04/26/2017 1452   NITRITE NEGATIVE 08/09/2017 1507   LEUKOCYTESUR NEGATIVE 08/09/2017 1507   LEUKOCYTESUR Negative 04/26/2017 1452   Sepsis Labs: @LABRCNTIP (procalcitonin:4,lacticidven:4)   ) Recent Results (from the past 240 hour(s))  Culture, blood (Routine x 2)     Status: None (Preliminary result)   Collection Time: 08/09/17 12:50 PM  Result Value Ref Range Status   Specimen Description BLOOD LEFT ANTECUBITAL  Final   Special Requests   Final    BOTTLES DRAWN AEROBIC AND ANAEROBIC Blood Culture adequate volume   Culture   Final    NO GROWTH 2 DAYS Performed at  [facility], [place] [address], [place], [place] [address]    Report Status PENDING  Incomplete  Culture, blood (Routine x 2)     Status: None (Preliminary result)   Collection Time: 08/09/17  1:23 PM  Result Value Ref Range Status   Specimen Description BLOOD LEFT HAND  Final   Special Requests   Final    BOTTLES DRAWN AEROBIC AND ANAEROBIC Blood Culture adequate volume   Culture   Final    NO GROWTH 2 DAYS Performed at [facility], [place] [address] [place], [place] [address]    Report Status PENDING  Incomplete  Urine culture     Status: Abnormal   Collection Time: 08/09/17  3:07 PM  Result Value Ref Range Status   Specimen Description URINE, CLEAN CATCH  Final   Special  Requests NONE  Final   Culture MULTIPLE SPECIES PRESENT, SUGGEST RECOLLECTION (A)  Final   Report Status 08/10/2017 FINAL  Final  MRSA PCR Screening     Status: None   Collection Time: 08/09/17  4:53 PM  Result Value Ref Range Status   MRSA by PCR NEGATIVE NEGATIVE Final    Comment:        The GeneXpert MRSA Assay (FDA approved for NASAL specimens only), is one component of a comprehensive MRSA colonization surveillance program. It is not intended to diagnose MRSA infection nor to guide or monitor treatment for MRSA infections.   Culture, sputum-assessment     Status: None   Collection Time: 08/11/17 11:57 AM  Result Value Ref Range Status   Specimen Description EXPECTORATED SPUTUM  Final   Special Requests Immunocompromised  Final   Sputum evaluation THIS SPECIMEN IS ACCEPTABLE FOR SPUTUM CULTURE  Final   Report Status 08/11/2017 FINAL  Final  Gram stain     Status: None   Collection Time: 08/11/17 11:57 AM  Result Value Ref Range Status   Specimen Description TRACHEAL SITE  Final   Special Requests Immunocompromised  Final   Gram Stain   Final    MODERATE WBC PRESENT,BOTH PMN AND MONONUCLEAR RARE GRAM POSITIVE RODS RARE GRAM POSITIVE COCCI IN PAIRS Performed at [facility], [address], [place], [place] [address]    Report Status 08/11/2017 FINAL  Final  Culture, respiratory (NON-Expectorated)     Status: None (Preliminary result)   Collection Time: 08/11/17 11:57 AM  Result Value Ref Range Status   Specimen Description EXPECTORATED SPUTUM  Final   Special Requests Immunocompromised Reflexed from [id number]  Final   Culture   Final    CULTURE REINCUBATED FOR BETTER GROWTH Performed at [facility], [place] [address], [place], [place] [address]    Report Status PENDING  Incomplete      Radiology Studies: Dg Chest 2 View  Result Date: 08/09/2017 CLINICAL DATA:  Cough and fever today.  History of lung cancer. EXAM: CHEST  2 VIEW COMPARISON:  06/08/2017 CT, 12/13/2016 radiographs and prior studies FINDINGS: Right lower lobe and possibly right middle lobe airspace opacities likely represent pneumonia. The left lung is clear. There is no evidence of pleural effusion or pneumothorax. The cardiomediastinal silhouette is unchanged with upper limits of normal heart size. No acute bony abnormalities are identified. IMPRESSION: Right lower lobe and possibly right middle lobe airspace opacities compatible with pneumonia. Radiographic follow-up to resolution is recommended. Electronically Signed   By: Margarette Canada M.D.   On: 08/09/2017 12:45        Scheduled Meds: . allopurinol  100 mg Oral Daily  . aspirin  81 mg Oral Daily  . diclofenac sodium  2 g Topical QID  . insulin aspart  0-5 Units Subcutaneous QHS  . insulin aspart  0-9 Units Subcutaneous TID WC  . levofloxacin  750 mg Oral Daily  . pantoprazole  40 mg Oral Daily  . predniSONE  40 mg Oral Q breakfast  . vitamin B-12  1,000 mcg Oral Daily  . vitamin C  500 mg Oral Daily   Continuous Infusions:   LOS: 3 days    Time spent: 25 minutes  Greater than 50% of the time spent on counseling and coordinating the care.   Leisa Lenz, MD [facility] Pager [phone]  If 7PM-7AM, please  contact night-coverage [email or web address] Password [email or web address] 08/12/2017, 1:33 PM

## 2017-08-12 NOTE — Plan of Care (Signed)
  Progressing Education: Knowledge of General Education information will improve 08/12/2017 2302 - Progressing by Talbert Forest, RN Health Behavior/Discharge Planning: Ability to manage health-related needs will improve 08/12/2017 2302 - Progressing by Talbert Forest, RN Clinical Measurements: Ability to maintain clinical measurements within normal limits will improve 08/12/2017 2302 - Progressing by Talbert Forest, RN Will remain free from infection 08/12/2017 2302 - Progressing by Talbert Forest, RN Diagnostic test results will improve 08/12/2017 2302 - Progressing by Talbert Forest, RN Respiratory complications will improve 08/12/2017 2302 - Progressing by Talbert Forest, RN Cardiovascular complication will be avoided 08/12/2017 2302 - Progressing by Talbert Forest, RN Nutrition: Adequate nutrition will be maintained 08/12/2017 2302 - Progressing by Talbert Forest, RN Coping: Level of anxiety will decrease 08/12/2017 2302 - Progressing by Talbert Forest, RN Elimination: Will not experience complications related to urinary retention 08/12/2017 2302 - Progressing by Talbert Forest, RN Pain Managment: General experience of comfort will improve 08/12/2017 2302 - Progressing by Talbert Forest, RN Safety: Ability to remain free from injury will improve 08/12/2017 2302 - Progressing by Talbert Forest, RN Skin Integrity: Risk for impaired skin integrity will decrease 08/12/2017 2302 - Progressing by Talbert Forest, RN

## 2017-08-13 LAB — BASIC METABOLIC PANEL
ANION GAP: 6 (ref 5–15)
BUN: 15 mg/dL (ref 6–20)
CALCIUM: 8.4 mg/dL — AB (ref 8.9–10.3)
CHLORIDE: 107 mmol/L (ref 101–111)
CO2: 23 mmol/L (ref 22–32)
Creatinine, Ser: 1.23 mg/dL (ref 0.61–1.24)
GFR calc Af Amer: >60 mL/min (ref >60)
GFR calc non Af Amer: 56 mL/min — ABNORMAL LOW (ref >60)
GLUCOSE: 173 mg/dL — AB (ref 65–99)
Potassium: 3.7 mmol/L (ref 3.5–5.1)
Sodium: 136 mmol/L (ref 135–145)

## 2017-08-13 LAB — CBC
HEMATOCRIT: 31.1 % — AB (ref 39.0–52.0)
HEMOGLOBIN: 10.4 g/dL — AB (ref 13.0–17.0)
MCH: 26.3 pg (ref 26.0–34.0)
MCHC: 33.4 g/dL (ref 30.0–36.0)
MCV: 78.5 fL (ref 78.0–100.0)
Platelets: 156 10*3/uL (ref 150–400)
RBC: 3.96 MIL/uL — ABNORMAL LOW (ref 4.22–5.81)
RDW: 15 % (ref 11.5–15.5)
WBC: 8.1 10*3/uL (ref 4.0–10.5)

## 2017-08-13 LAB — GLUCOSE, CAPILLARY
GLUCOSE-CAPILLARY: 175 mg/dL — AB (ref 65–99)
Glucose-Capillary: 179 mg/dL — ABNORMAL HIGH (ref 65–99)
Glucose-Capillary: 315 mg/dL — ABNORMAL HIGH (ref 65–99)
Glucose-Capillary: 269 mg/dL — ABNORMAL HIGH (ref 65–99)

## 2017-08-13 NOTE — Plan of Care (Signed)
  Progressing Education: Knowledge of General Education information will improve 08/13/2017 2230 - Progressing by Talbert Forest, RN Health Behavior/Discharge Planning: Ability to manage health-related needs will improve 08/13/2017 2230 - Progressing by Talbert Forest, RN Clinical Measurements: Ability to maintain clinical measurements within normal limits will improve 08/13/2017 2230 - Progressing by Talbert Forest, RN Will remain free from infection 08/13/2017 2230 - Progressing by Talbert Forest, RN Diagnostic test results will improve 08/13/2017 2230 - Progressing by Talbert Forest, RN Respiratory complications will improve 08/13/2017 2230 - Progressing by Talbert Forest, RN Cardiovascular complication will be avoided 08/13/2017 2230 - Progressing by Talbert Forest, RN Activity: Risk for activity intolerance will decrease 08/13/2017 2230 - Progressing by Talbert Forest, RN Nutrition: Adequate nutrition will be maintained 08/13/2017 2230 - Progressing by Talbert Forest, RN Coping: Level of anxiety will decrease 08/13/2017 2230 - Progressing by Talbert Forest, RN Elimination: Will not experience complications related to bowel motility 08/13/2017 2230 - Progressing by Talbert Forest, RN Will not experience complications related to urinary retention 08/13/2017 2230 - Progressing by Talbert Forest, RN Pain Managment: General experience of comfort will improve 08/13/2017 2230 - Progressing by Talbert Forest, RN Safety: Ability to remain free from injury will improve 08/13/2017 2230 - Progressing by Talbert Forest, RN Skin Integrity: Risk for impaired skin integrity will decrease 08/13/2017 2230 - Progressing by Talbert Forest, RN

## 2017-08-13 NOTE — Evaluation (Signed)
Physical Therapy Evaluation Patient Details Name: Richard Navarro MRN: [id number] DOB: [birth year] Today's Date: 08/13/2017   History of Present Illness  71 y.o. 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  Clinical Impression  Pt is independent with mobility, he ambulated 800' without an assistive device, no loss of balance, SaO2 81% on room air while walking, 91% on 3L O2 walking. Will need home O2.  PT signing off as he is independent with mobility. Encouraged pt to ambulate in halls TID with O2.     Follow Up Recommendations No PT follow up    Equipment Recommendations  Other (comment)(oxygen)    Recommendations for Other Services       Precautions / Restrictions Precautions Precautions: Other (comment) Precaution Comments: monitor O2 Restrictions Weight Bearing Restrictions: No      Mobility  Bed Mobility Overal bed mobility: Independent                Transfers Overall transfer level: Independent                  Ambulation/Gait Ambulation/Gait assistance: Independent Ambulation Distance (Feet): 800 Feet Assistive device: None Gait Pattern/deviations: WFL(Within Functional Limits)   Gait velocity interpretation: at or above normal speed for age/gender General Gait Details: SaO2 81% on room air walking, 91% on 3L O2 walking, no loss of balance  Stairs            Wheelchair Mobility    Modified Rankin (Stroke Patients Only)       Balance Overall balance assessment: Independent                                           Pertinent Vitals/Pain Pain Assessment: No/denies pain    Home Living Family/patient expects to be discharged to:: Private residence Living Arrangements: Spouse/significant other Available  Help at Discharge: Available 24 hours/day Type of Home: House       Home Layout: Two level;Able to live on main level with bedroom/bathroom Home Equipment: None      Prior Function Level of Independence: Independent               Hand Dominance        Extremity/Trunk Assessment   Upper Extremity Assessment Upper Extremity Assessment: Overall WFL for tasks assessed    Lower Extremity Assessment Lower Extremity Assessment: Overall WFL for tasks assessed(wears compression socks on B feet due to h/o edema)    Cervical / Trunk Assessment Cervical / Trunk Assessment: Normal  Communication   Communication: No difficulties  Cognition Arousal/Alertness: Awake/alert Behavior During Therapy: WFL for tasks assessed/performed Overall Cognitive Status: Within Functional Limits for tasks assessed                                        General Comments      Exercises     Assessment/Plan    PT Assessment Patent does not need any further PT services  PT Problem List         PT Treatment Interventions      PT Goals (Current goals can be found in the Care Plan section)  Acute Rehab  PT Goals PT Goal Formulation: All assessment and education complete, DC therapy    Frequency     Barriers to discharge        Co-evaluation               AM-PAC PT "6 Clicks" Daily Activity  Outcome Measure Difficulty turning over in bed (including adjusting bedclothes, sheets and blankets)?: None Difficulty moving from lying on back to sitting on the side of the bed? : None Difficulty sitting down on and standing up from a chair with arms (e.g., wheelchair, bedside commode, etc,.)?: None Help needed moving to and from a bed to chair (including a wheelchair)?: None Help needed walking in hospital room?: None Help needed climbing 3-5 steps with a railing? : None 6 Click Score: 24    End of Session Equipment Utilized During Treatment: Gait  belt;Oxygen Activity Tolerance: Patient tolerated treatment well Patient left: in chair;with call bell/phone within reach Nurse Communication: Mobility status      Time: 7414-2395 PT Time Calculation (min) (ACUTE ONLY): 30 min   Charges:   PT Evaluation $PT Eval Low Complexity: 1 Low PT Treatments $Gait Training: 8-22 mins   PT G Codes:          Philomena Doheny 08/13/2017, 9:45 AM  [phone]

## 2017-08-13 NOTE — Progress Notes (Signed)
Patient ID: Richard Navarro, male   DOB: 1946-05-30, 71 y.o.   MRN: [id number]  PROGRESS NOTE    Richard Navarro  TKW:[id number] DOB: [birth year] DOA: 08/09/2017  PCP: [name], MD   Brief Narrative:  71 y.o.malewith medical history significant ofmetastatic neuroendocrine carcinoma, carcinoid tumor on oral chemotherapy, COPD, diabetes, hypertension presenting to the emergency department with fevers chills cough and nausea with imaging findings c/w pneumonia.  Assessment & Plan:   Sepsis secondary to pneumonia - Elevated lactate, tachycardia, fever, increased RR, elevated WBC with imaging findings c/w pneumonia. On oral chemo, immunocompromised.  Curb 65 on admission 3. - Sepsis physiology resolved. - initially on vanc 12/20-12/20 and cefepime 12/20-12/22 - Pt currently on Levaquin, day #3 - Continue Prednisone (started 12/21); complete 5 days - Blood cx negative so far - Strep pneumo and legionella negative - Influenza negative   Stage IV low grade neuroendocrine carcinoma, carcinoid tumor - Follow up with oncology  AKI on CKD stage III - Cr WNL  Type 2 DM with diabetic nephropathy - CBG's in past 24 hours: 160, 312, 260  Hypokalemia - Supplemented and WNL  NAGMA - CO2 WNL   DVT prophylaxis: SCD's Code Status: DNR/DNI Family Communication: wife at bedside Disposition Plan: d/c in next 24-48 hours    Consultants:   None  Procedures:   None  Antimicrobials:   Levaquin    Subjective: No overnight events.  Objective: Vitals:   08/12/17 0405 08/12/17 1700 08/12/17 2059 08/13/17 0438  BP: 140/71 140/70 138/73 (!) 156/84  Pulse: 76 77 72 65  Resp: 18 18 18 18   Temp: 98.2 F (36.8 C) 98.2 F (36.8 C) 98.7 F (37.1 C) 98 F (36.7 C)  TempSrc: Oral Oral Oral   SpO2: 94% 94% 96% 91%  Weight:      Height:        Intake/Output Summary (Last 24 hours) at 08/13/2017 0730 Last data filed at 08/12/2017 2210 Gross per 24 hour  Intake  600 ml  Output 1525 ml  Net -925 ml   Filed Weights   08/09/17 1217 08/09/17 1653  Weight: 81.6 kg (180 lb) 84.8 kg (186 lb 15.2 oz)    Physical Exam  Constitutional: Appears well-developed and well-nourished. No distress.  CVS: RRR, S1/S2 + Pulmonary: no wheezing, no rhonchi  Abdominal: Soft. BS +,  no distension, tenderness, rebound or guarding.  Musculoskeletal: Normal range of motion. No edema and no tenderness.  Lymphadenopathy: No lymphadenopathy noted, cervical, inguinal. Neuro: Alert. Normal reflexes, muscle tone coordination. No cranial nerve deficit. Skin: Skin is warm and dry. No rash noted. Not diaphoretic. No erythema. No pallor.  Psychiatric: Normal mood and affect. Behavior, judgment, thought content normal.     Data Reviewed: I have personally reviewed following labs and imaging studies  CBC: Recent Labs  Lab 08/09/17 1250 08/09/17 1658 08/10/17 0243 08/11/17 0524 08/12/17 0557 08/13/17 0430  WBC 12.5* 11.7* 10.6* 11.8* 9.9 8.1  NEUTROABS 11.0*  --   --   --   --   --   HGB 11.6* 10.9* 10.3* 10.3* 10.1* 10.4*  HCT 34.2* 32.5* 30.9* 30.4* 30.3* 31.1*  MCV 79.7 80.0 79.8 78.8 78.7 78.5  PLT 150 134* 132* 133* 129* 924   Basic Metabolic Panel: Recent Labs  Lab 08/09/17 1250 08/09/17 1658 08/10/17 0243 08/11/17 0524 08/12/17 0557 08/13/17 0430  NA 134*  --  139 135 137 136  K 3.5  --  3.2* 3.4* 3.6 3.7  CL 103  --  114* 108 108 107  CO2 22  --  20* 20* 24 23  GLUCOSE 275*  --  138* 144* 208* 173*  BUN 25*  --  17 14 14 15   CREATININE 1.81* 1.49* 1.38* 1.14 1.19 1.23  CALCIUM 8.1*  --  7.2* 8.2* 8.2* 8.4*  MG  --   --  1.6* 2.4  --   --    GFR: Estimated Creatinine Clearance: 63.9 mL/min (by C-G formula based on SCr of 1.23 mg/dL). Liver Function Tests: Recent Labs  Lab 08/09/17 1250 08/10/17 0243 08/11/17 0524 08/12/17 0557  AST 42* 27 25 25   ALT 31 23 21 24   ALKPHOS 74 61 64 58  BILITOT 0.7 0.8 0.7 0.5  PROT 6.8 5.6* 6.0* 6.0*    ALBUMIN 3.5 2.7* 2.9* 2.5*   No results for input(s): LIPASE, AMYLASE in the last 168 hours. No results for input(s): AMMONIA in the last 168 hours. Coagulation Profile: Recent Labs  Lab 08/09/17 1250  INR 0.95   Cardiac Enzymes: No results for input(s): CKTOTAL, CKMB, CKMBINDEX, TROPONINI in the last 168 hours. BNP (last 3 results) No results for input(s): PROBNP in the last 8760 hours. HbA1C: No results for input(s): HGBA1C in the last 72 hours. CBG: Recent Labs  Lab 08/11/17 2230 08/12/17 0757 08/12/17 1138 08/12/17 1731 08/12/17 2138  GLUCAP 232* 176* 160* 312* 260*   Lipid Profile: No results for input(s): CHOL, HDL, LDLCALC, TRIG, CHOLHDL, LDLDIRECT in the last 72 hours. Thyroid Function Tests: No results for input(s): TSH, T4TOTAL, FREET4, T3FREE, THYROIDAB in the last 72 hours. Anemia Panel: No results for input(s): VITAMINB12, FOLATE, FERRITIN, TIBC, IRON, RETICCTPCT in the last 72 hours. Urine analysis:    Component Value Date/Time   COLORURINE YELLOW 08/09/2017 [place] 08/09/2017 1507   LABSPEC 1.010 08/09/2017 1507   LABSPEC 1.015 04/26/2017 1452   PHURINE 6.0 08/09/2017 1507   GLUCOSEU 100 (A) 08/09/2017 1507   GLUCOSEU Negative 04/26/2017 1452   HGBUR NEGATIVE 08/09/2017 1507   BILIRUBINUR NEGATIVE 08/09/2017 1507   BILIRUBINUR Negative 04/26/2017 1452   KETONESUR NEGATIVE 08/09/2017 1507   PROTEINUR NEGATIVE 08/09/2017 1507   UROBILINOGEN 0.2 04/26/2017 1452   NITRITE NEGATIVE 08/09/2017 1507   LEUKOCYTESUR NEGATIVE 08/09/2017 1507   LEUKOCYTESUR Negative 04/26/2017 1452   Sepsis Labs: @LABRCNTIP (procalcitonin:4,lacticidven:4)   ) Recent Results (from the past 240 hour(s))  Culture, blood (Routine x 2)     Status: None (Preliminary result)   Collection Time: 08/09/17 12:50 PM  Result Value Ref Range Status   Specimen Description BLOOD LEFT ANTECUBITAL  Final   Special Requests   Final    BOTTLES DRAWN AEROBIC AND  ANAEROBIC Blood Culture adequate volume   Culture   Final    NO GROWTH 3 DAYS Performed at [facility], [place] [address], [place], [place] [address]    Report Status PENDING  Incomplete  Culture, blood (Routine x 2)     Status: None (Preliminary result)   Collection Time: 08/09/17  1:23 PM  Result Value Ref Range Status   Specimen Description BLOOD LEFT HAND  Final   Special Requests   Final    BOTTLES DRAWN AEROBIC AND ANAEROBIC Blood Culture adequate volume   Culture   Final    NO GROWTH 3 DAYS Performed at [facility], [place] [address] [place], [place] [address]    Report Status PENDING  Incomplete  Urine culture     Status: Abnormal  Collection Time: 08/09/17  3:07 PM  Result Value Ref Range Status   Specimen Description URINE, CLEAN CATCH  Final   Special Requests NONE  Final   Culture MULTIPLE SPECIES PRESENT, SUGGEST RECOLLECTION (A)  Final   Report Status 08/10/2017 FINAL  Final  MRSA PCR Screening     Status: None   Collection Time: 08/09/17  4:53 PM  Result Value Ref Range Status   MRSA by PCR NEGATIVE NEGATIVE Final    Comment:        The GeneXpert MRSA Assay (FDA approved for NASAL specimens only), is one component of a comprehensive MRSA colonization surveillance program. It is not intended to diagnose MRSA infection nor to guide or monitor treatment for MRSA infections.   Culture, sputum-assessment     Status: None   Collection Time: 08/11/17 11:57 AM  Result Value Ref Range Status   Specimen Description EXPECTORATED SPUTUM  Final   Special Requests Immunocompromised  Final   Sputum evaluation THIS SPECIMEN IS ACCEPTABLE FOR SPUTUM CULTURE  Final   Report Status 08/11/2017 FINAL  Final  Gram stain     Status: None   Collection Time: 08/11/17 11:57 AM  Result Value Ref Range Status   Specimen Description TRACHEAL SITE  Final   Special Requests Immunocompromised  Final   Gram Stain   Final    MODERATE WBC PRESENT,BOTH PMN AND  MONONUCLEAR RARE GRAM POSITIVE RODS RARE GRAM POSITIVE COCCI IN PAIRS Performed at [facility], [address] [place], [place] [address]    Report Status 08/11/2017 FINAL  Final  Culture, respiratory (NON-Expectorated)     Status: None (Preliminary result)   Collection Time: 08/11/17 11:57 AM  Result Value Ref Range Status   Specimen Description EXPECTORATED SPUTUM  Final   Special Requests Immunocompromised Reflexed from [id number]  Final   Culture   Final    CULTURE REINCUBATED FOR BETTER GROWTH Performed at [facility], [place] [address], [place], [place] [address]    Report Status PENDING  Incomplete      Radiology Studies: Dg Chest 2 View  Result Date: 08/09/2017 CLINICAL DATA:  Cough and fever today.  History of lung cancer. EXAM: CHEST  2 VIEW COMPARISON:  06/08/2017 CT, 12/13/2016 radiographs and prior studies FINDINGS: Right lower lobe and possibly right middle lobe airspace opacities likely represent pneumonia. The left lung is clear. There is no evidence of pleural effusion or pneumothorax. The cardiomediastinal silhouette is unchanged with upper limits of normal heart size. No acute bony abnormalities are identified. IMPRESSION: Right lower lobe and possibly right middle lobe airspace opacities compatible with pneumonia. Radiographic follow-up to resolution is recommended. Electronically Signed   By: Margarette Canada M.D.   On: 08/09/2017 12:45        Scheduled Meds: . allopurinol  100 mg Oral Daily  . aspirin  81 mg Oral Daily  . diclofenac sodium  2 g Topical QID  . insulin aspart  0-5 Units Subcutaneous QHS  . insulin aspart  0-9 Units Subcutaneous TID WC  . levofloxacin  750 mg Oral Daily  . pantoprazole  40 mg Oral Daily  . predniSONE  40 mg Oral Q breakfast  . vitamin B-12  1,000 mcg Oral Daily  . vitamin C  500 mg Oral Daily   Continuous Infusions:   LOS: 4 days    Time spent: 25 minutes  Greater than 50% of the time spent on counseling and  coordinating the care.   Natasa Stigall  Charlies Silvers, MD [facility] Pager [phone]  If 7PM-7AM, please contact night-coverage [email or web address] Password [email or web address] 08/13/2017, 7:30 AM

## 2017-08-13 NOTE — Progress Notes (Signed)
SATURATION QUALIFICATIONS: (This note is used to comply with regulatory documentation for home oxygen)  Patient Saturations on Room Air at Rest = 90%  Patient Saturations on Room Air while Ambulating = 81%  Patient Saturations on 3 Liters of oxygen while Ambulating = 91%  Please briefly explain why patient needs home oxygen: to maintain SaO2 greater than 90% with activity.   Blondell Reveal Kistler PT 08/13/2017  [phone]

## 2017-08-14 ENCOUNTER — Inpatient Hospital Stay (HOSPITAL_COMMUNITY): Payer: Medicare Other

## 2017-08-14 ENCOUNTER — Encounter (HOSPITAL_COMMUNITY): Payer: Self-pay

## 2017-08-14 LAB — BASIC METABOLIC PANEL
Anion gap: 6 (ref 5–15)
BUN: 16 mg/dL (ref 6–20)
CHLORIDE: 106 mmol/L (ref 101–111)
CO2: 25 mmol/L (ref 22–32)
CREATININE: 1.24 mg/dL (ref 0.61–1.24)
Calcium: 8.6 mg/dL — ABNORMAL LOW (ref 8.9–10.3)
GFR calc Af Amer: >60 mL/min (ref >60)
GFR calc non Af Amer: 56 mL/min — ABNORMAL LOW (ref >60)
GLUCOSE: 193 mg/dL — AB (ref 65–99)
POTASSIUM: 3.4 mmol/L — AB (ref 3.5–5.1)
Sodium: 137 mmol/L (ref 135–145)

## 2017-08-14 LAB — CULTURE, BLOOD (ROUTINE X 2)
CULTURE: NO GROWTH
Culture: NO GROWTH
SPECIAL REQUESTS: ADEQUATE
Special Requests: ADEQUATE

## 2017-08-14 LAB — GLUCOSE, CAPILLARY
GLUCOSE-CAPILLARY: 197 mg/dL — AB (ref 65–99)
Glucose-Capillary: 178 mg/dL — ABNORMAL HIGH (ref 65–99)
Glucose-Capillary: 170 mg/dL — ABNORMAL HIGH (ref 65–99)
Glucose-Capillary: 182 mg/dL — ABNORMAL HIGH (ref 65–99)

## 2017-08-14 LAB — CBC
HEMATOCRIT: 30.3 % — AB (ref 39.0–52.0)
HEMOGLOBIN: 10.2 g/dL — AB (ref 13.0–17.0)
MCH: 26.5 pg (ref 26.0–34.0)
MCHC: 33.7 g/dL (ref 30.0–36.0)
MCV: 78.7 fL (ref 78.0–100.0)
Platelets: 176 10*3/uL (ref 150–400)
RBC: 3.85 MIL/uL — ABNORMAL LOW (ref 4.22–5.81)
RDW: 15 % (ref 11.5–15.5)
WBC: 7 10*3/uL (ref 4.0–10.5)

## 2017-08-14 LAB — CULTURE, RESPIRATORY W GRAM STAIN: Culture: NORMAL

## 2017-08-14 NOTE — Progress Notes (Signed)
Patient ID: Richard Navarro, male   DOB: 06-08-1946, 71 y.o.   MRN: [id number]  PROGRESS NOTE    Richard Navarro  ELF:[id number] DOB: Apr 09, 1946 DOA: 08/09/2017  PCP: [name], MD   Brief Narrative:  71 y.o.malewith medical history significant ofmetastatic neuroendocrine carcinoma, carcinoid tumor on oral chemotherapy, COPD, diabetes, hypertension presenting to the emergency department with fevers chills cough and nausea with imaging findings c/w pneumonia.  Assessment & Plan:   Sepsis secondary to pneumonia - Elevated lactate, tachycardia, fever, increased RR, elevated WBC with imaging findings c/w pneumonia. On oral chemo, immunocompromised.  Curb 65 on admission 3. - Sepsis physiology resolved. - initially on vanc 12/20-12/20 and cefepime 12/20-12/22 - Blood cx negative so far - Strep pneumo and legionella negative - Influenza negative  - Continue Levaquin  - Stop prednisone today  - Still quite hypoxic on ambulation 81% without [place] - Will monitor for next 24 horus to see what baseline oxygen requirements are  Stage IV low grade neuroendocrine carcinoma, carcinoid tumor - Follow up with oncology  AKI on CKD stage III - Cr WNL  Type 2 DM with diabetic nephropathy - Continue SSI  Hypokalemia - Supplemented   NAGMA - CO2 WNL   DVT prophylaxis: SCD"s Code Status: DNR/DNI Family Communication: wife at bedside Disposition Plan:d/c in am   Consultants:   None  Procedures:   None  Antimicrobials:   Levaquin    Subjective: No overnight events.  Objective: Vitals:   08/13/17 0942 08/13/17 1502 08/13/17 2159 08/14/17 0526  BP:  (!) 144/79 (!) 158/83 (!) 170/78  Pulse:  78 62 (!) 57  Resp:  18 16 20   Temp:  98 F (36.7 C) 98.1 F (36.7 C) 98.2 F (36.8 C)  TempSrc:  Oral Oral Oral  SpO2: 91% 97% 98% 94%  Weight:      Height:        Intake/Output Summary (Last 24 hours) at 08/14/2017 1201 Last data filed at 08/14/2017 0528 Gross  per 24 hour  Intake 240 ml  Output 1995 ml  Net -1755 ml   Filed Weights   08/09/17 1217 08/09/17 1653  Weight: 81.6 kg (180 lb) 84.8 kg (186 lb 15.2 oz)    Physical Exam  Constitutional: Appears well-developed and well-nourished. No distress.   CVS: RRR, S1/S2 + Pulmonary: diminished but no wheezing  Abdominal: Soft. BS +,  no distension, tenderness, rebound or guarding.  Musculoskeletal: Normal range of motion. No edema and no tenderness.  Lymphadenopathy: No lymphadenopathy noted, cervical, inguinal. Neuro: Alert. Normal reflexes, muscle tone coordination. No cranial nerve deficit. Skin: Skin is warm and dry. No rash noted. Not diaphoretic. No erythema. No pallor.  Psychiatric: Normal mood and affect. Behavior, judgment, thought content normal.   Data Reviewed: I have personally reviewed following labs and imaging studies  CBC: Recent Labs  Lab 08/09/17 1250  08/10/17 0243 08/11/17 0524 08/12/17 0557 08/13/17 0430 08/14/17 0412  WBC 12.5*   < > 10.6* 11.8* 9.9 8.1 7.0  NEUTROABS 11.0*  --   --   --   --   --   --   HGB 11.6*   < > 10.3* 10.3* 10.1* 10.4* 10.2*  HCT 34.2*   < > 30.9* 30.4* 30.3* 31.1* 30.3*  MCV 79.7   < > 79.8 78.8 78.7 78.5 78.7  PLT 150   < > 132* 133* 129* 156 176   < > = values in this interval not displayed.   Basic Metabolic  Panel: Recent Labs  Lab 08/10/17 0243 08/11/17 0524 08/12/17 0557 08/13/17 0430 08/14/17 0412  NA 139 135 137 136 137  K 3.2* 3.4* 3.6 3.7 3.4*  CL 114* 108 108 107 106  CO2 20* 20* 24 23 25   GLUCOSE 138* 144* 208* 173* 193*  BUN 17 14 14 15 16   CREATININE 1.38* 1.14 1.19 1.23 1.24  CALCIUM 7.2* 8.2* 8.2* 8.4* 8.6*  MG 1.6* 2.4  --   --   --    GFR: Estimated Creatinine Clearance: 63.4 mL/min (by C-G formula based on SCr of 1.24 mg/dL). Liver Function Tests: Recent Labs  Lab 08/09/17 1250 08/10/17 0243 08/11/17 0524 08/12/17 0557  AST 42* 27 25 25   ALT 31 23 21 24   ALKPHOS 74 61 64 58  BILITOT 0.7 0.8  0.7 0.5  PROT 6.8 5.6* 6.0* 6.0*  ALBUMIN 3.5 2.7* 2.9* 2.5*   No results for input(s): LIPASE, AMYLASE in the last 168 hours. No results for input(s): AMMONIA in the last 168 hours. Coagulation Profile: Recent Labs  Lab 08/09/17 1250  INR 0.95   Cardiac Enzymes: No results for input(s): CKTOTAL, CKMB, CKMBINDEX, TROPONINI in the last 168 hours. BNP (last 3 results) No results for input(s): PROBNP in the last 8760 hours. HbA1C: No results for input(s): HGBA1C in the last 72 hours. CBG: Recent Labs  Lab 08/13/17 1207 08/13/17 1728 08/13/17 2200 08/14/17 0736 08/14/17 1140  GLUCAP 179* 315* 269* 178* 197*   Lipid Profile: No results for input(s): CHOL, HDL, LDLCALC, TRIG, CHOLHDL, LDLDIRECT in the last 72 hours. Thyroid Function Tests: No results for input(s): TSH, T4TOTAL, FREET4, T3FREE, THYROIDAB in the last 72 hours. Anemia Panel: No results for input(s): VITAMINB12, FOLATE, FERRITIN, TIBC, IRON, RETICCTPCT in the last 72 hours. Urine analysis:    Component Value Date/Time   COLORURINE YELLOW 08/09/2017 [place] 08/09/2017 1507   LABSPEC 1.010 08/09/2017 1507   LABSPEC 1.015 04/26/2017 1452   PHURINE 6.0 08/09/2017 1507   GLUCOSEU 100 (A) 08/09/2017 1507   GLUCOSEU Negative 04/26/2017 1452   HGBUR NEGATIVE 08/09/2017 1507   BILIRUBINUR NEGATIVE 08/09/2017 1507   BILIRUBINUR Negative 04/26/2017 1452   KETONESUR NEGATIVE 08/09/2017 1507   PROTEINUR NEGATIVE 08/09/2017 1507   UROBILINOGEN 0.2 04/26/2017 1452   NITRITE NEGATIVE 08/09/2017 1507   LEUKOCYTESUR NEGATIVE 08/09/2017 1507   LEUKOCYTESUR Negative 04/26/2017 1452   Sepsis Labs: @LABRCNTIP (procalcitonin:4,lacticidven:4)   ) Recent Results (from the past 240 hour(s))  Culture, blood (Routine x 2)     Status: None (Preliminary result)   Collection Time: 08/09/17 12:50 PM  Result Value Ref Range Status   Specimen Description BLOOD LEFT ANTECUBITAL  Final   Special Requests   Final     BOTTLES DRAWN AEROBIC AND ANAEROBIC Blood Culture adequate volume   Culture   Final    NO GROWTH 4 DAYS Performed at [facility], [place] [address], [place], [place] [address]    Report Status PENDING  Incomplete  Culture, blood (Routine x 2)     Status: None (Preliminary result)   Collection Time: 08/09/17  1:23 PM  Result Value Ref Range Status   Specimen Description BLOOD LEFT HAND  Final   Special Requests   Final    BOTTLES DRAWN AEROBIC AND ANAEROBIC Blood Culture adequate volume   Culture   Final    NO GROWTH 4 DAYS Performed at [facility], [place] [address] [place], [place] [address]    Report  Status PENDING  Incomplete  Urine culture     Status: Abnormal   Collection Time: 08/09/17  3:07 PM  Result Value Ref Range Status   Specimen Description URINE, CLEAN CATCH  Final   Special Requests NONE  Final   Culture MULTIPLE SPECIES PRESENT, SUGGEST RECOLLECTION (A)  Final   Report Status 08/10/2017 FINAL  Final  MRSA PCR Screening     Status: None   Collection Time: 08/09/17  4:53 PM  Result Value Ref Range Status   MRSA by PCR NEGATIVE NEGATIVE Final    Comment:        The GeneXpert MRSA Assay (FDA approved for NASAL specimens only), is one component of a comprehensive MRSA colonization surveillance program. It is not intended to diagnose MRSA infection nor to guide or monitor treatment for MRSA infections.   Culture, sputum-assessment     Status: None   Collection Time: 08/11/17 11:57 AM  Result Value Ref Range Status   Specimen Description EXPECTORATED SPUTUM  Final   Special Requests Immunocompromised  Final   Sputum evaluation THIS SPECIMEN IS ACCEPTABLE FOR SPUTUM CULTURE  Final   Report Status 08/11/2017 FINAL  Final  Gram stain     Status: None   Collection Time: 08/11/17 11:57 AM  Result Value Ref Range Status   Specimen Description TRACHEAL SITE  Final   Special Requests Immunocompromised  Final   Gram Stain   Final    MODERATE WBC  PRESENT,BOTH PMN AND MONONUCLEAR RARE GRAM POSITIVE RODS RARE GRAM POSITIVE COCCI IN PAIRS Performed at [facility], [address] [place], [place] [address]    Report Status 08/11/2017 FINAL  Final  Culture, respiratory (NON-Expectorated)     Status: None   Collection Time: 08/11/17 11:57 AM  Result Value Ref Range Status   Specimen Description EXPECTORATED SPUTUM  Final   Special Requests Immunocompromised Reflexed from [id number]  Final   Gram Stain   Final    ABUNDANT WBC PRESENT, PREDOMINANTLY PMN RARE GRAM POSITIVE RODS RARE GRAM POSITIVE COCCI IN PAIRS IN SINGLES    Culture   Final    FEW Consistent with normal respiratory flora. Performed at [facility], [place] [address] [place], [place] [address]    Report Status 08/14/2017 FINAL  Final      Radiology Studies: Dg Chest Port 1 View  Result Date: 08/14/2017 CLINICAL DATA:  Follow-up pneumonia, cough EXAM: PORTABLE CHEST 1 VIEW COMPARISON:  08/09/2017 FINDINGS: Consolidation in the right lower lobe has worsened since prior study. Probable right effusion. Left base atelectasis or infiltrate also slightly increased. IMPRESSION: Worsening consolidation in the right lower lobe compatible with worsening pneumonia. Increasing left base atelectasis or infiltrate. Layering right effusion. Electronically Signed   By: Rolm Baptise M.D.   On: 08/14/2017 07:42        Scheduled Meds: . allopurinol  100 mg Oral Daily  . aspirin  81 mg Oral Daily  . diclofenac sodium  2 g Topical QID  . insulin aspart  0-9 Units Subcutaneous TID WC  . levofloxacin  750 mg Oral Daily  . pantoprazole  40 mg Oral Daily  . vitamin B-12  1,000 mcg Oral Daily  . vitamin C  500 mg Oral Daily   Continuous Infusions:   LOS: 5 days    Time spent: 25 minutes  Greater than 50% of the time spent on counseling and coordinating the care.   Leisa Lenz, MD [facility] Pager [phone]  If 7PM-7AM,  please contact  night-coverage [email or web address] Password [email or web address] 08/14/2017, 12:01 PM

## 2017-08-15 ENCOUNTER — Telehealth: Payer: Self-pay | Admitting: Oncology

## 2017-08-15 LAB — GLUCOSE, CAPILLARY
GLUCOSE-CAPILLARY: 272 mg/dL — AB (ref 65–99)
GLUCOSE-CAPILLARY: 161 mg/dL — AB (ref 65–99)
GLUCOSE-CAPILLARY: 160 mg/dL — AB (ref 65–99)
Glucose-Capillary: 119 mg/dL — ABNORMAL HIGH (ref 65–99)

## 2017-08-15 LAB — CBC
HEMATOCRIT: 34.6 % — AB (ref 39.0–52.0)
Hemoglobin: 11.5 g/dL — ABNORMAL LOW (ref 13.0–17.0)
MCH: 26.5 pg (ref 26.0–34.0)
MCHC: 33.2 g/dL (ref 30.0–36.0)
MCV: 79.7 fL (ref 78.0–100.0)
PLATELETS: 221 10*3/uL (ref 150–400)
RBC: 4.34 MIL/uL (ref 4.22–5.81)
RDW: 15 % (ref 11.5–15.5)
WBC: 7.4 10*3/uL (ref 4.0–10.5)

## 2017-08-15 LAB — BASIC METABOLIC PANEL
ANION GAP: 7 (ref 5–15)
BUN: 14 mg/dL (ref 6–20)
CALCIUM: 8.2 mg/dL — AB (ref 8.9–10.3)
CO2: 26 mmol/L (ref 22–32)
Chloride: 104 mmol/L (ref 101–111)
Creatinine, Ser: 1.35 mg/dL — ABNORMAL HIGH (ref 0.61–1.24)
GFR, EST AFRICAN AMERICAN: 59 mL/min — AB (ref >60)
GFR, EST NON AFRICAN AMERICAN: 50 mL/min — AB (ref >60)
GLUCOSE: 130 mg/dL — AB (ref 65–99)
POTASSIUM: 3.2 mmol/L — AB (ref 3.5–5.1)
Sodium: 137 mmol/L (ref 135–145)

## 2017-08-15 MED ORDER — POTASSIUM CHLORIDE CRYS ER 20 MEQ PO TBCR
40.0000 meq | EXTENDED_RELEASE_TABLET | Freq: Once | ORAL | Status: AC
  Administered 2017-08-15: 40 meq via ORAL
  Filled 2017-08-15: qty 2

## 2017-08-15 NOTE — Progress Notes (Signed)
PT Note  Patient Details Name: Richard Navarro MRN: [id number] DOB: October 24, 1945    Noted new order for PT eval. Please see PT  1 x eval from 08/13/2017. Pt independent with mobility, ambulation in hallway no assistive device and no loss of balance, therefore no functional limitations for goals at this time, and not further needs for PT.  Explained this to the nurse, and they will perform 02 sat with mobility. Thanks     Clide Dales 08/15/2017, 11:36 AM  Clide Dales, PT Pager: [phone] 08/15/2017

## 2017-08-15 NOTE — Telephone Encounter (Signed)
Patient wife came in patient is in the hospital she wanted to know if up coming appointments should be cancelled

## 2017-08-15 NOTE — Progress Notes (Signed)
Patient ID: Richard Navarro, male   DOB: [birth year], 71 y.o.   MRN: [id number]  PROGRESS NOTE    Richard Navarro  HGD:[id number] DOB: 24-Feb-1946 DOA: 08/09/2017  PCP: [name], MD   Brief Narrative:  71 y.o.malewith medical history significant ofmetastatic neuroendocrine carcinoma, carcinoid tumor on oral chemotherapy, COPD, diabetes, hypertension presenting to the emergency department with fevers chills cough and nausea with imaging findings c/w pneumonia.  Assessment & Plan:   Sepsis secondary to pneumonia - Elevated lactate, tachycardia, fever, increased RR, elevated WBC with imaging findings c/w pneumonia. On oral chemo, immunocompromised.  Curb 65 on admission 73. - Sepsis physiology resolved. - initially on vanc 12/20-12/20 and cefepime 12/20-12/22 - Blood cultures negative, strep pneumonia and legionella negative, influenza negative - Patient was on prednisone but we stopped it 08/14/2017 - Continue Levaquin - Obtain physical therapy evaluation and obtain oxygen saturation again while walking  Stage IV low grade neuroendocrine carcinoma, carcinoid tumor - Outpatient follow-up with oncology  AKI on CKD stage III - Creatinine is within normal limit  Type 2 DM with diabetic nephropathy - Continue SSI  Hypokalemia - Supplemented - Follow-up BMP tomorrow morning  NAGMA - CO2 WNL   DVT prophylaxis: SCD's Code Status: DNR/DNI Family Communication: wife at [place] Disposition Plan: anticipate d/c tomorrow   Consultants:   None  Procedures:   None  Antimicrobials:   Levaquin    Subjective: No overnight events.  Objective: Vitals:   08/14/17 0526 08/14/17 1747 08/14/17 2017 08/15/17 0413  BP: (!) 170/78 (!) 150/79 (!) 136/98 137/72  Pulse: (!) 57 62 78 73  Resp: 20  18 18   Temp: 98.2 F (36.8 C) 98.1 F (36.7 C) 98.2 F (36.8 C) 98.7 F (37.1 C)  TempSrc: Oral Oral Oral Oral  SpO2: 94% 100% 98% 97%  Weight:      Height:         Intake/Output Summary (Last 24 hours) at 08/15/2017 0820 Last data filed at 08/15/2017 0600 Gross per 24 hour  Intake 360 ml  Output -  Net 360 ml   Filed Weights   08/09/17 1217 08/09/17 1653  Weight: 81.6 kg (180 lb) 84.8 kg (186 lb 15.2 oz)    Physical Exam  Constitutional: Appears well-developed and well-nourished. No distress.  CVS: RRR, S1/S2 + Pulmonary: diminished breath sounds, no wheezing  Abdominal: Soft. BS +,  no distension, tenderness, rebound or guarding.  Musculoskeletal: Normal range of motion. No edema and no tenderness.  Lymphadenopathy: No lymphadenopathy noted, cervical, inguinal. Neuro: Alert. Normal reflexes, muscle tone coordination. No cranial nerve deficit. Skin: Skin is warm and dry. Psychiatric: Normal mood and affect. Behavior, judgment, thought content normal.    Data Reviewed: I have personally reviewed following labs and imaging studies  CBC: Recent Labs  Lab 08/09/17 1250  08/11/17 0524 08/12/17 0557 08/13/17 0430 08/14/17 0412 08/15/17 0426  WBC 12.5*   < > 11.8* 9.9 8.1 7.0 7.4  NEUTROABS 11.0*  --   --   --   --   --   --   HGB 11.6*   < > 10.3* 10.1* 10.4* 10.2* 11.5*  HCT 34.2*   < > 30.4* 30.3* 31.1* 30.3* 34.6*  MCV 79.7   < > 78.8 78.7 78.5 78.7 79.7  PLT 150   < > 133* 129* 156 176 221   < > = values in this interval not displayed.   Basic Metabolic Panel: Recent Labs  Lab 08/10/17 0243 08/11/17 0524 08/12/17 0557  08/13/17 0430 08/14/17 0412 08/15/17 0426  NA 139 135 137 136 137 137  K 3.2* 3.4* 3.6 3.7 3.4* 3.2*  CL 114* 108 108 107 106 104  CO2 20* 20* 24 23 25 26   GLUCOSE 138* 144* 208* 173* 193* 130*  BUN 17 14 14 15 16 14   CREATININE 1.38* 1.14 1.19 1.23 1.24 1.35*  CALCIUM 7.2* 8.2* 8.2* 8.4* 8.6* 8.2*  MG 1.6* 2.4  --   --   --   --    GFR: Estimated Creatinine Clearance: 58.2 mL/min (A) (by C-G formula based on SCr of 1.35 mg/dL (H)). Liver Function Tests: Recent Labs  Lab 08/09/17 1250  08/10/17 0243 08/11/17 0524 08/12/17 0557  AST 42* 27 25 25   ALT 31 23 21 24   ALKPHOS 74 61 64 58  BILITOT 0.7 0.8 0.7 0.5  PROT 6.8 5.6* 6.0* 6.0*  ALBUMIN 3.5 2.7* 2.9* 2.5*   No results for input(s): LIPASE, AMYLASE in the last 168 hours. No results for input(s): AMMONIA in the last 168 hours. Coagulation Profile: Recent Labs  Lab 08/09/17 1250  INR 0.95   Cardiac Enzymes: No results for input(s): CKTOTAL, CKMB, CKMBINDEX, TROPONINI in the last 168 hours. BNP (last 3 results) No results for input(s): PROBNP in the last 8760 hours. HbA1C: No results for input(s): HGBA1C in the last 72 hours. CBG: Recent Labs  Lab 08/14/17 0736 08/14/17 1140 08/14/17 1745 08/14/17 2027 08/15/17 0717  GLUCAP 178* 197* 170* 182* 119*   Lipid Profile: No results for input(s): CHOL, HDL, LDLCALC, TRIG, CHOLHDL, LDLDIRECT in the last 72 hours. Thyroid Function Tests: No results for input(s): TSH, T4TOTAL, FREET4, T3FREE, THYROIDAB in the last 72 hours. Anemia Panel: No results for input(s): VITAMINB12, FOLATE, FERRITIN, TIBC, IRON, RETICCTPCT in the last 72 hours. Urine analysis:    Component Value Date/Time   COLORURINE YELLOW 08/09/2017 [place] 08/09/2017 1507   LABSPEC 1.010 08/09/2017 1507   LABSPEC 1.015 04/26/2017 1452   PHURINE 6.0 08/09/2017 1507   GLUCOSEU 100 (A) 08/09/2017 1507   GLUCOSEU Negative 04/26/2017 1452   HGBUR NEGATIVE 08/09/2017 1507   BILIRUBINUR NEGATIVE 08/09/2017 1507   BILIRUBINUR Negative 04/26/2017 1452   KETONESUR NEGATIVE 08/09/2017 1507   PROTEINUR NEGATIVE 08/09/2017 1507   UROBILINOGEN 0.2 04/26/2017 1452   NITRITE NEGATIVE 08/09/2017 1507   LEUKOCYTESUR NEGATIVE 08/09/2017 1507   LEUKOCYTESUR Negative 04/26/2017 1452   Sepsis Labs: @LABRCNTIP (procalcitonin:4,lacticidven:4)   ) Recent Results (from the past 240 hour(s))  Culture, blood (Routine x 2)     Status: None   Collection Time: 08/09/17 12:50 PM  Result Value  Ref Range Status   Specimen Description BLOOD LEFT ANTECUBITAL  Final   Special Requests   Final    BOTTLES DRAWN AEROBIC AND ANAEROBIC Blood Culture adequate volume   Culture   Final    NO GROWTH 5 DAYS Performed at [facility], [place] [address], [place], [place] [address]    Report Status 08/14/2017 FINAL  Final  Culture, blood (Routine x 2)     Status: None   Collection Time: 08/09/17  1:23 PM  Result Value Ref Range Status   Specimen Description BLOOD LEFT HAND  Final   Special Requests   Final    BOTTLES DRAWN AEROBIC AND ANAEROBIC Blood Culture adequate volume   Culture   Final    NO GROWTH 5 DAYS Performed at [facility], [place] [address], [place],  [address]  Report Status 08/14/2017 FINAL  Final  Urine culture     Status: Abnormal   Collection Time: 08/09/17  3:07 PM  Result Value Ref Range Status   Specimen Description URINE, CLEAN CATCH  Final   Special Requests NONE  Final   Culture MULTIPLE SPECIES PRESENT, SUGGEST RECOLLECTION (A)  Final   Report Status 08/10/2017 FINAL  Final  MRSA PCR Screening     Status: None   Collection Time: 08/09/17  4:53 PM  Result Value Ref Range Status   MRSA by PCR NEGATIVE NEGATIVE Final    Comment:        The GeneXpert MRSA Assay (FDA approved for NASAL specimens only), is one component of a comprehensive MRSA colonization surveillance program. It is not intended to diagnose MRSA infection nor to guide or monitor treatment for MRSA infections.   Culture, sputum-assessment     Status: None   Collection Time: 08/11/17 11:57 AM  Result Value Ref Range Status   Specimen Description EXPECTORATED SPUTUM  Final   Special Requests Immunocompromised  Final   Sputum evaluation THIS SPECIMEN IS ACCEPTABLE FOR SPUTUM CULTURE  Final   Report Status 08/11/2017 FINAL  Final  Gram stain     Status: None   Collection Time: 08/11/17 11:57 AM  Result Value Ref Range Status   Specimen Description TRACHEAL SITE  Final    Special Requests Immunocompromised  Final   Gram Stain   Final    MODERATE WBC PRESENT,BOTH PMN AND MONONUCLEAR RARE GRAM POSITIVE RODS RARE GRAM POSITIVE COCCI IN PAIRS Performed at [facility], [address], [place], [place] [address]    Report Status 08/11/2017 FINAL  Final  Culture, respiratory (NON-Expectorated)     Status: None   Collection Time: 08/11/17 11:57 AM  Result Value Ref Range Status   Specimen Description EXPECTORATED SPUTUM  Final   Special Requests Immunocompromised Reflexed from [id number]  Final   Gram Stain   Final    ABUNDANT WBC PRESENT, PREDOMINANTLY PMN RARE GRAM POSITIVE RODS RARE GRAM POSITIVE COCCI IN PAIRS IN SINGLES    Culture   Final    FEW Consistent with normal respiratory flora. Performed at [facility], [place] [address], [place], [place] [address]    Report Status 08/14/2017 FINAL  Final      Radiology Studies: Dg Chest Port 1 View  Result Date: 08/14/2017 CLINICAL DATA:  Follow-up pneumonia, cough EXAM: PORTABLE CHEST 1 VIEW COMPARISON:  08/09/2017 FINDINGS: Consolidation in the right lower lobe has worsened since prior study. Probable right effusion. Left base atelectasis or infiltrate also slightly increased. IMPRESSION: Worsening consolidation in the right lower lobe compatible with worsening pneumonia. Increasing left base atelectasis or infiltrate. Layering right effusion. Electronically Signed   By: Rolm Baptise M.D.   On: 08/14/2017 07:42        Scheduled Meds: . allopurinol  100 mg Oral Daily  . aspirin  81 mg Oral Daily  . diclofenac sodium  2 g Topical QID  . insulin aspart  0-9 Units Subcutaneous TID WC  . levofloxacin  750 mg Oral Daily  . pantoprazole  40 mg Oral Daily  . vitamin B-12  1,000 mcg Oral Daily  . vitamin C  500 mg Oral Daily   Continuous Infusions:   LOS: 6 days    Time spent: 25 minutes  Greater than 50% of the time spent on counseling and coordinating the care.   Leisa Lenz, MD [facility] Pager [phone]  If 7PM-7AM, please contact night-coverage [email or web address] Password [email or web address] 08/15/2017, 8:20 AM

## 2017-08-15 NOTE — Progress Notes (Signed)
Pt ambulated in the hall without oxygen for 414ft.  Oxygen sat was 92-97 on room air. Tolerated well.

## 2017-08-16 ENCOUNTER — Telehealth: Payer: Self-pay | Admitting: Medical Oncology

## 2017-08-16 ENCOUNTER — Telehealth: Payer: Self-pay | Admitting: Internal Medicine

## 2017-08-16 LAB — BASIC METABOLIC PANEL
ANION GAP: 6 (ref 5–15)
BUN: 16 mg/dL (ref 6–20)
CHLORIDE: 105 mmol/L (ref 101–111)
CO2: 26 mmol/L (ref 22–32)
CREATININE: 1.46 mg/dL — AB (ref 0.61–1.24)
Calcium: 8.2 mg/dL — ABNORMAL LOW (ref 8.9–10.3)
GFR calc non Af Amer: 46 mL/min — ABNORMAL LOW (ref >60)
GFR, EST AFRICAN AMERICAN: 53 mL/min — AB (ref >60)
Glucose, Bld: 172 mg/dL — ABNORMAL HIGH (ref 65–99)
Potassium: 3.6 mmol/L (ref 3.5–5.1)
SODIUM: 137 mmol/L (ref 135–145)

## 2017-08-16 LAB — CBC
HEMATOCRIT: 34.2 % — AB (ref 39.0–52.0)
HEMOGLOBIN: 11.4 g/dL — AB (ref 13.0–17.0)
MCH: 26.8 pg (ref 26.0–34.0)
MCHC: 33.3 g/dL (ref 30.0–36.0)
MCV: 80.5 fL (ref 78.0–100.0)
Platelets: 251 10*3/uL (ref 150–400)
RBC: 4.25 MIL/uL (ref 4.22–5.81)
RDW: 15.4 % (ref 11.5–15.5)
WBC: 7.4 10*3/uL (ref 4.0–10.5)

## 2017-08-16 LAB — GLUCOSE, CAPILLARY: GLUCOSE-CAPILLARY: 133 mg/dL — AB (ref 65–99)

## 2017-08-16 NOTE — Discharge Instructions (Signed)

## 2017-08-16 NOTE — Telephone Encounter (Signed)
Tried to contact [place] ta[facility] appt cancelled for tomorrow.-line busy.Appts cancelled.

## 2017-08-16 NOTE — Discharge Summary (Signed)
Physician Discharge Summary  Richard Navarro AOZ:[id number] DOB: [birth year] DOA: 08/09/2017  PCP: Richard Olp, MD  Admit date: 08/09/2017 Discharge date: 08/16/2017  Recommendations for Outpatient Follow-up:  1. Check CBC and BMP in outpt setting 2. Follow up with oncology per sch appt   Discharge Diagnoses:  Principal Problem:   Pneumonia Active Problems:   Diabetes mellitus without complication ([place])   Hypertension   Gout   GERD (gastroesophageal reflux disease)   Neuroendocrine carcinoma ([place])   Tachycardia   AKI (acute kidney injury) ([place])    Discharge Condition: stable   Diet recommendation: as tolerated   History of present illness:  71 y.o.malewith medical history significant ofmetastatic neuroendocrine carcinoma, carcinoid tumor on oral chemotherapy, COPD, diabetes, hypertension presenting to the emergency department with fevers chills cough and nausea with imaging findings c/w pneumonia  Hospital Course:   Sepsis secondary to pneumonia - Elevated lactate, tachycardia, fever, increased RR, elevated WBC with imaging findings c/w pneumonia. On oral chemo, immunocompromised. Curb 65 on admission 3. - Sepsis physiology resolved. - initially onvanc 12/20-12/20 andcefepime12/20-12/22 - Blood cultures negative, strep pneumonia and legionella negative, influenza negative - Patient was on prednisone but we stopped it 08/14/2017 - Pt had 7 days on abx therapy, abx not required on discharge   Stage IV low grade neuroendocrine carcinoma, carcinoid tumor - Outpatient follow-up with oncology  AKI on CKD stage III - Creatinine is within normal limit  Type 2 DM with diabetic nephropathy - Resume home meds  Hypokalemia - Supplemented  NAGMA - CO2 WNL   DVT prophylaxis: SCD's Code Status: DNR/DNI Family Communication: family not at the bedside this am    Consultants:   None  Procedures:   None  Antimicrobials:   Levaquin      Signed:  Leisa Lenz, MD  [facility] 08/16/2017, 8:38 AM  Pager #: [phone]  Time spent in minutes: more than 30 minutes    Discharge Exam: Vitals:   08/15/17 2235 08/16/17 0617  BP: (!) 160/85 (!) 143/63  Pulse: 75 78  Resp: 18 16  Temp: 98.9 F (37.2 C) 98.4 F (36.9 C)  SpO2: 99% 99%   Vitals:   08/15/17 1402 08/15/17 1424 08/15/17 2235 08/16/17 0617  BP: 121/67  (!) 160/85 (!) 143/63  Pulse: 77  75 78  Resp: 16  18 16   Temp: 98.6 F (37 C)  98.9 F (37.2 C) 98.4 F (36.9 C)  TempSrc: Oral  Oral Oral  SpO2: 98% 95% 99% 99%  Weight:      Height:        General: Pt is alert, follows commands appropriately, not in acute distress Cardiovascular: Regular rate and rhythm, S1/S2 + Respiratory: Clear to auscultation bilaterally, no wheezing, no crackles, no rhonchi Abdominal: Soft, non tender, non distended, bowel sounds +, no guarding Extremities: no edema, no cyanosis, pulses palpable bilaterally DP and PT Neuro: Grossly nonfocal  Discharge Instructions  Discharge Instructions    Diet - low sodium heart healthy   Complete by:  As directed    Increase activity slowly   Complete by:  As directed      Allergies as of 08/16/2017      Reactions   Lisinopril Swelling   Angioedema- on this and afinitor same time   Simvastatin Other (See Comments)   Joint ache      Medication List    STOP taking these medications   AFINITOR 7.5 MG tablet Generic drug:  everolimus     TAKE  these medications   allopurinol 100 MG tablet Commonly known as:  ZYLOPRIM TAKE 1 TABLET DAILY   amLODipine 5 MG tablet Commonly known as:  NORVASC Take 1 tablet (5 mg total) daily by mouth.   aspirin 81 MG tablet Take 1 tablet (81 mg total) by mouth daily.   EYE DROPS OP Place 1 drop into both eyes daily as needed (redness relief).   glimepiride 2 MG tablet Commonly known as:  AMARYL Take 1 tablet (2 mg total) daily before breakfast by mouth. What  changed:  how much to take   glucose blood test strip Use to test blood sugar twice a day   loperamide 2 MG tablet Commonly known as:  IMODIUM A-D Take 1 mg by mouth daily as needed for diarrhea or loose stools.   magic mouthwash w/lidocaine Soln Swish and swallow 5 cc by mouth every six hours for mouth sores.   metFORMIN 500 MG tablet Commonly known as:  GLUCOPHAGE Take 1 tablet (500 mg total) 2 (two) times daily with a meal by mouth.   pantoprazole 40 MG tablet Commonly known as:  PROTONIX Take 1 tablet (40 mg total) daily by mouth.   tadalafil 10 MG tablet Commonly known as:  CIALIS Take 1 tablet (10 mg total) by mouth daily as needed for erectile dysfunction (every 72 hours maximum).   vitamin B-12 1000 MCG tablet Commonly known as:  CYANOCOBALAMIN Take 1,000 mcg by mouth daily.   vitamin C 500 MG tablet Commonly known as:  ASCORBIC ACID Take 500 mg by mouth daily.   VOLTAREN 1 % Gel Generic drug:  diclofenac sodium APPLY 2 GRAMS TOPICALLY FOUR TIMES A DAY AS NEEDED FOR LEFT SHOULDER ARTHRITIS      Follow-up Information    Richard Olp, MD. Schedule an appointment as soon as possible for a visit in 1 week(s).   Specialty:  Family Medicine Contact information: [address] [place] [address] [phone]            The results of significant diagnostics from this hospitalization (including imaging, microbiology, ancillary and laboratory) are listed below for reference.    Significant Diagnostic Studies: Dg Chest 2 View  Result Date: 08/09/2017 CLINICAL DATA:  Cough and fever today.  History of lung cancer. EXAM: CHEST  2 VIEW COMPARISON:  06/08/2017 CT, 12/13/2016 radiographs and prior studies FINDINGS: Right lower lobe and possibly right middle lobe airspace opacities likely represent pneumonia. The left lung is clear. There is no evidence of pleural effusion or pneumothorax. The cardiomediastinal silhouette is unchanged with upper  limits of normal heart size. No acute bony abnormalities are identified. IMPRESSION: Right lower lobe and possibly right middle lobe airspace opacities compatible with pneumonia. Radiographic follow-up to resolution is recommended. Electronically Signed   By: Margarette Canada M.D.   On: 08/09/2017 12:45   Dg Chest Port 1 View  Result Date: 08/14/2017 CLINICAL DATA:  Follow-up pneumonia, cough EXAM: PORTABLE CHEST 1 VIEW COMPARISON:  08/09/2017 FINDINGS: Consolidation in the right lower lobe has worsened since prior study. Probable right effusion. Left base atelectasis or infiltrate also slightly increased. IMPRESSION: Worsening consolidation in the right lower lobe compatible with worsening pneumonia. Increasing left base atelectasis or infiltrate. Layering right effusion. Electronically Signed   By: Rolm Baptise M.D.   On: 08/14/2017 07:42    Microbiology: Recent Results (from the past 240 hour(s))  Culture, blood (Routine x 2)     Status: None   Collection Time: 08/09/17 12:50 PM  Result Value Ref Range Status   Specimen Description BLOOD LEFT ANTECUBITAL  Final   Special Requests   Final    BOTTLES DRAWN AEROBIC AND ANAEROBIC Blood Culture adequate volume   Culture   Final    NO GROWTH 5 DAYS Performed at [facility], [address] [place], [place] [address]    Report Status 08/14/2017 FINAL  Final  Culture, blood (Routine x 2)     Status: None   Collection Time: 08/09/17  1:23 PM  Result Value Ref Range Status   Specimen Description BLOOD LEFT HAND  Final   Special Requests   Final    BOTTLES DRAWN AEROBIC AND ANAEROBIC Blood Culture adequate volume   Culture   Final    NO GROWTH 5 DAYS Performed at [facility], [place] [address] [place], [place] [address]    Report Status 08/14/2017 FINAL  Final  Urine culture     Status: Abnormal   Collection Time: 08/09/17  3:07 PM  Result Value Ref Range Status   Specimen Description URINE, CLEAN CATCH  Final   Special Requests  NONE  Final   Culture MULTIPLE SPECIES PRESENT, SUGGEST RECOLLECTION (A)  Final   Report Status 08/10/2017 FINAL  Final  MRSA PCR Screening     Status: None   Collection Time: 08/09/17  4:53 PM  Result Value Ref Range Status   MRSA by PCR NEGATIVE NEGATIVE Final    Comment:        The GeneXpert MRSA Assay (FDA approved for NASAL specimens only), is one component of a comprehensive MRSA colonization surveillance program. It is not intended to diagnose MRSA infection nor to guide or monitor treatment for MRSA infections.   Culture, sputum-assessment     Status: None   Collection Time: 08/11/17 11:57 AM  Result Value Ref Range Status   Specimen Description EXPECTORATED SPUTUM  Final   Special Requests Immunocompromised  Final   Sputum evaluation THIS SPECIMEN IS ACCEPTABLE FOR SPUTUM CULTURE  Final   Report Status 08/11/2017 FINAL  Final  Gram stain     Status: None   Collection Time: 08/11/17 11:57 AM  Result Value Ref Range Status   Specimen Description TRACHEAL SITE  Final   Special Requests Immunocompromised  Final   Gram Stain   Final    MODERATE WBC PRESENT,BOTH PMN AND MONONUCLEAR RARE GRAM POSITIVE RODS RARE GRAM POSITIVE COCCI IN PAIRS Performed at [facility], [address] [place], [place] [address]    Report Status 08/11/2017 FINAL  Final  Culture, respiratory (NON-Expectorated)     Status: None   Collection Time: 08/11/17 11:57 AM  Result Value Ref Range Status   Specimen Description EXPECTORATED SPUTUM  Final   Special Requests Immunocompromised Reflexed from [id number]  Final   Gram Stain   Final    ABUNDANT WBC PRESENT, PREDOMINANTLY PMN RARE GRAM POSITIVE RODS RARE GRAM POSITIVE COCCI IN PAIRS IN SINGLES    Culture   Final    FEW Consistent with normal respiratory flora. Performed at [facility], [place] [address], [place], [place] [address]    Report Status 08/14/2017 FINAL  Final     Labs: Basic Metabolic Panel: Recent Labs  Lab  08/10/17 0243 08/11/17 0524 08/12/17 0557 08/13/17 0430 08/14/17 0412 08/15/17 0426 08/16/17 0414  NA 139 135 137 136 137 137 137  K 3.2* 3.4* 3.6 3.7 3.4* 3.2* 3.6  CL 114* 108 108 107 106 104 105  CO2 20* 20* 24 23 25 26 26   GLUCOSE 138* 144* 208* 173* 193* 130* 172*  BUN 17 14 14 15 16 14 16   CREATININE 1.38* 1.14 1.19 1.23 1.24 1.35* 1.46*  CALCIUM 7.2* 8.2* 8.2* 8.4* 8.6* 8.2* 8.2*  MG 1.6* 2.4  --   --   --   --   --    Liver Function Tests: Recent Labs  Lab 08/09/17 1250 08/10/17 0243 08/11/17 0524 08/12/17 0557  AST 42* 27 25 25   ALT 31 23 21 24   ALKPHOS 74 61 64 58  BILITOT 0.7 0.8 0.7 0.5  PROT 6.8 5.6* 6.0* 6.0*  ALBUMIN 3.5 2.7* 2.9* 2.5*   No results for input(s): LIPASE, AMYLASE in the last 168 hours. No results for input(s): AMMONIA in the last 168 hours. CBC: Recent Labs  Lab 08/09/17 1250  08/12/17 0557 08/13/17 0430 08/14/17 0412 08/15/17 0426 08/16/17 0414  WBC 12.5*   < > 9.9 8.1 7.0 7.4 7.4  NEUTROABS 11.0*  --   --   --   --   --   --   HGB 11.6*   < > 10.1* 10.4* 10.2* 11.5* 11.4*  HCT 34.2*   < > 30.3* 31.1* 30.3* 34.6* 34.2*  MCV 79.7   < > 78.7 78.5 78.7 79.7 80.5  PLT 150   < > 129* 156 176 221 251   < > = values in this interval not displayed.   Cardiac Enzymes: No results for input(s): CKTOTAL, CKMB, CKMBINDEX, TROPONINI in the last 168 hours. BNP: BNP (last 3 results) No results for input(s): BNP in the last 8760 hours.  ProBNP (last 3 results) No results for input(s): PROBNP in the last 8760 hours.  CBG: Recent Labs  Lab 08/15/17 0717 08/15/17 1118 08/15/17 1632 08/15/17 2244 08/16/17 0807  GLUCAP 119* 272* 161* 160* 133*

## 2017-08-16 NOTE — Telephone Encounter (Signed)
Spoke to patients wife regarding upcoming January 2019 appointments.

## 2017-08-16 NOTE — Progress Notes (Signed)
Pt ambulated in hall approx 480ft.  Oxygen sats remained 92-95% on room air and tolerated well.

## 2017-08-16 NOTE — Telephone Encounter (Signed)
-----   Message from Curt Bears, MD sent at 08/15/2017  4:46 PM EST ----- Regarding: RE: appt  No We can reschedule for 1-2 weeks after the 12/28. Thank you. ----- Message ----- From: Ardeen Garland, RN Sent: 08/15/2017   4:12 PM To: Lucile Crater, RN, Curt Bears, MD Subject: appt                                           [name] called and said pt In hospital . He may go home on Thursday. Do you want him to keep appt 12/28? Let [name] know.

## 2017-08-17 ENCOUNTER — Telehealth: Payer: Self-pay | Admitting: *Deleted

## 2017-08-17 ENCOUNTER — Ambulatory Visit: Payer: Medicare Other | Admitting: Oncology

## 2017-08-17 ENCOUNTER — Other Ambulatory Visit: Payer: Medicare Other

## 2017-08-17 NOTE — Telephone Encounter (Signed)
Per chart review: Admit date: 08/09/2017 Discharge date: 08/16/2017  Recommendations for Outpatient Follow-up:  1. Check CBC and BMP in outpt setting 2. Follow up with oncology per sch appt   Discharge Diagnoses:  Principal Problem:   Pneumonia Active Problems:   Diabetes mellitus without complication ([place])   Hypertension   Gout   GERD (gastroesophageal reflux disease)   Neuroendocrine carcinoma ([place])   Tachycardia   AKI (acute kidney injury) ([place])    Discharge Condition: stable   Diet recommendation: as tolerated   _____________________________________________________________________________________ Transition Care Management Follow-up Telephone Call   Date discharged? 08/06/17   How have you been since you were released from the hospital? "good"    Do you understand why you were in the hospital? yes   Do you understand the discharge instructions? yes   Where were you discharged to? home   Items Reviewed:  Medications reviewed: yes  Allergies reviewed: yes  Dietary changes reviewed: yes  Referrals reviewed: yes   Functional Questionnaire:   Activities of Daily Living (ADLs):   He states they are independent in the following: ambulation, bathing and hygiene, feeding, continence, grooming, toileting and dressing States they require assistance with the following: None   Any transportation issues/concerns?: no   Any patient concerns? Yes, Spoke to Ms Alcario Drought (emergency contact) and she states his temperature was 99.3 this morning. I told her to watch his temperature and his symptoms and if temperature continues to rise or he starts experiencing worsening or new symptoms he needs to call the office immediately. She stated her understanding.    Confirmed importance and date/time of follow-up visits scheduled yes  Provider Appointment booked with Dr hunter 08/27/16  Confirmed with patient if condition begins to worsen call PCP or go to the ER.   Patient was given the office number and encouraged to call back with question or concerns.  : yes

## 2017-08-17 NOTE — Telephone Encounter (Signed)
thanks

## 2017-08-27 ENCOUNTER — Encounter: Payer: Self-pay | Admitting: Family Medicine

## 2017-08-27 ENCOUNTER — Ambulatory Visit (INDEPENDENT_AMBULATORY_CARE_PROVIDER_SITE_OTHER): Payer: Medicare Other | Admitting: Family Medicine

## 2017-08-27 VITALS — BP 114/70 | HR 80 | Temp 98.5°F | Ht 75.0 in | Wt 189.2 lb

## 2017-08-27 DIAGNOSIS — J181 Lobar pneumonia, unspecified organism: Secondary | ICD-10-CM | POA: Diagnosis not present

## 2017-08-27 DIAGNOSIS — C3491 Malignant neoplasm of unspecified part of right bronchus or lung: Secondary | ICD-10-CM

## 2017-08-27 DIAGNOSIS — J439 Emphysema, unspecified: Secondary | ICD-10-CM

## 2017-08-27 DIAGNOSIS — J189 Pneumonia, unspecified organism: Secondary | ICD-10-CM

## 2017-08-27 DIAGNOSIS — E119 Type 2 diabetes mellitus without complications: Secondary | ICD-10-CM | POA: Diagnosis not present

## 2017-08-27 DIAGNOSIS — I10 Essential (primary) hypertension: Secondary | ICD-10-CM

## 2017-08-27 DIAGNOSIS — N182 Chronic kidney disease, stage 2 (mild): Secondary | ICD-10-CM

## 2017-08-27 LAB — CBC WITH DIFFERENTIAL/PLATELET
Basophils Absolute: 0.1 10*3/uL (ref 0.0–0.1)
Basophils Relative: 1.2 % (ref 0.0–3.0)
EOS ABS: 0 10*3/uL (ref 0.0–0.7)
Eosinophils Relative: 0.6 % (ref 0.0–5.0)
HEMATOCRIT: 36.3 % — AB (ref 39.0–52.0)
HEMOGLOBIN: 11.8 g/dL — AB (ref 13.0–17.0)
LYMPHS PCT: 25.3 % (ref 12.0–46.0)
Lymphs Abs: 1.7 10*3/uL (ref 0.7–4.0)
MCHC: 32.5 g/dL (ref 30.0–36.0)
MCV: 84.3 fl (ref 78.0–100.0)
Monocytes Absolute: 0.4 10*3/uL (ref 0.1–1.0)
Monocytes Relative: 5.7 % (ref 3.0–12.0)
Neutro Abs: 4.7 10*3/uL (ref 1.4–7.7)
Neutrophils Relative %: 67.2 % (ref 43.0–77.0)
PLATELETS: 455 10*3/uL — AB (ref 150.0–400.0)
RBC: 4.31 Mil/uL (ref 4.22–5.81)
RDW: 17.7 % — ABNORMAL HIGH (ref 11.5–15.5)
WBC: 6.9 10*3/uL (ref 4.0–10.5)

## 2017-08-27 LAB — COMPREHENSIVE METABOLIC PANEL
ALBUMIN: 3.9 g/dL (ref 3.5–5.2)
ALT: 17 U/L (ref 0–53)
AST: 17 U/L (ref 0–37)
Alkaline Phosphatase: 74 U/L (ref 39–117)
BILIRUBIN TOTAL: 0.3 mg/dL (ref 0.2–1.2)
BUN: 20 mg/dL (ref 6–23)
CALCIUM: 9.1 mg/dL (ref 8.4–10.5)
CO2: 25 meq/L (ref 19–32)
CREATININE: 1.33 mg/dL (ref 0.40–1.50)
Chloride: 101 mEq/L (ref 96–112)
GFR: 67.69 mL/min (ref >60.00)
Glucose, Bld: 105 mg/dL — ABNORMAL HIGH (ref 70–99)
Potassium: 3.9 mEq/L (ref 3.5–5.1)
Sodium: 135 mEq/L (ref 135–145)
Total Protein: 7.8 g/dL (ref 6.0–8.3)

## 2017-08-27 NOTE — Assessment & Plan Note (Signed)
Luckily quit smoking earlier this year. No wheeze on exam. Had been on prednisone. Does not need controller medication at baseline- will follow.

## 2017-08-27 NOTE — Progress Notes (Signed)
Subjective:  Richard Navarro is a 72 y.o. year old very pleasant male patient who presents for transitional care management and hospital follow up for sepsis due to pneumonia. Patient was hospitalized from 08/09/17  to 08/16/17. A TCM phone call was completed on 08/17/17. Medical complexity moderate.    Patient states he was feeling very fatigued for several days then morning of admission became absolutely exhausted, shivering and felt short of breath. He was taken to the hospital.   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   When patient was seen in ED x-ray showed pneumonia of right lower lobe and possibly right middle lobe. I personally reviewed this x-ray from 08/09/17 and noted opacification of right lower lung- questionable extension into right middle lobe. No bony abnormalities noted. No cardiomegaly. I also reviewed 1 view x-ray on 08/14/17 which appears to have worsening consolidation of RLL extending into right middle lobe. No bony abnormalities or cardiomegaly. This is the side of his cancer and will require further workup.    He was treated with aggressive regimen of vancomycin and cefepime initially due to elevated lactic acid, tachycardia, fever, increased respiratory rate, leukocytosis due to being immunocompromised. Workup for specific causes was negative including strep pneumonia, legionella and influenza. Patient was on prednisone during hospitalization initially but stopped on 12.25.18. He received 7 days of antibiotics prior to discharge  For his carcinoid tumor- he was advised outpatient follow up. He sees Dr. Earlie Server within a week.   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 Patients hypertension was controlled on home amlodipine  Diabetes home meds held and was on sliding scale but before discharge placed back on metformin and glimepiride. Home CBGs have trended back towards around 100 recently.   He had hypokalemia in hospital but this was repleted and no recurrent issues   See problem oriented charting as well ROS- no fever, chills. Denies breathing difficulty or shortness of breath. No wheeze. No eema.    Past Medical History-  Patient Active Problem List   Diagnosis Date Noted  . Neuroendocrine carcinoma ([place]) 08/09/2017    Priority: High  . Primary cancer of right lung ([place]) 12/22/2016    Priority: High  . Diabetes mellitus without complication ([place])     Priority: High  . Pulmonary emphysema ([place]) 12/01/2016    Priority: Medium  . PTSD (post-traumatic stress disorder) 12/07/2015    Priority: Medium  . Erectile dysfunction 04/09/2014    Priority: Medium  . Tobacco abuse 03/26/2014    Priority: Medium  . Hypertension     Priority: Medium  . Gout     Priority: Medium  . Depression     Priority: Medium  . Hyperlipidemia     Priority: Medium  . Encounter for antineoplastic chemotherapy 01/10/2017    Priority: Low  . Goals of care, counseling/discussion 01/10/2017    Priority: Low  . CKD (chronic kidney disease), stage II 04/17/2014    Priority: Low  . Prostate cancer screening 04/17/2014    Priority: Low  . Arthritis 03/26/2014    Priority: Low  . History of adenomatous polyp of colon 03/26/2014    Priority: Low  . GERD (  gastroesophageal reflux disease)     Priority: Low  . Pneumonia 08/09/2017  . Tachycardia 08/09/2017  . Urinary frequency 04/27/2017    Medications- reviewed and updated  A medical reconciliation was performed comparing current medicines to hospital discharge medications. Current Outpatient Medications  Medication Sig Dispense Refill  . allopurinol (ZYLOPRIM)  100 MG tablet TAKE 1 TABLET DAILY 90 tablet 3  . amLODipine (NORVASC) 5 MG tablet Take 1 tablet (5 mg total) daily by mouth. 90 tablet 3  . aspirin 81 MG tablet Take 1 tablet (81 mg total) by mouth daily. 90 tablet 3  . glimepiride (AMARYL) 2 MG tablet Take 1 tablet (2 mg total) daily before breakfast by mouth. (Patient taking differently: Take 1 mg by mouth daily before breakfast. ) 30 tablet 5  . glucose blood test strip Use to test blood sugar twice a day 200 each 3  . loperamide (IMODIUM A-D) 2 MG tablet Take 1 mg by mouth daily as needed for diarrhea or loose stools.    . magic mouthwash w/lidocaine SOLN Swish and swallow 5 cc by mouth every six hours for mouth sores. 240 mL 1  . metFORMIN (GLUCOPHAGE) 500 MG tablet Take 1 tablet (500 mg total) 2 (two) times daily with a meal by mouth. 180 tablet 3  . pantoprazole (PROTONIX) 40 MG tablet Take 1 tablet (40 mg total) daily by mouth. 90 tablet 3  . tadalafil (CIALIS) 10 MG tablet Take 1 tablet (10 mg total) by mouth daily as needed for erectile dysfunction (every 72 hours maximum). 10 tablet 3  . Tetrahydrozoline HCl (EYE DROPS OP) Place 1 drop into both eyes daily as needed (redness relief).    . vitamin B-12 (CYANOCOBALAMIN) 1000 MCG tablet Take 1,000 mcg by mouth daily.    . vitamin C (ASCORBIC ACID) 500 MG tablet Take 500 mg by mouth daily.    . VOLTAREN 1 % GEL APPLY 2 GRAMS TOPICALLY FOUR TIMES A DAY AS NEEDED FOR LEFT SHOULDER ARTHRITIS 300 g 3   No current facility-administered medications for this visit.     Objective: BP 114/70 (BP Location: Left Arm, Patient Position: Sitting, Cuff Size: Large)   Pulse 80   Temp 98.5 F (36.9 C) (Oral)   Ht 6\' 3"  (1.905 m)   Wt 189 lb 3.2 oz (85.8 kg)   SpO2 96%   BMI 23.65 kg/m  Gen: NAD, resting comfortably CV: RRR no murmurs rubs or gallops Lungs: CTAB on left, decrease in air movement right base.  Abdomen: soft/nontender/nondistended/normal bowel sounds. Healthy weight Ext: no  edema Skin: warm, dry  Assessment/Plan:  Diabetes mellitus without complication (HCC) Remains  Reasonably controlled Lab Results  Component Value Date   HGBA1C 7.2 07/05/2017  sugars at home fasting back towards 100 now. Continue metformin and amaryl. If had recurrent hospitalizations would have to reconsider metformin with risk of lactic acidosis and elevation he had in hospital this visit.   Pulmonary emphysema ([place]) Luckily quit smoking earlier this year. No wheeze on exam. Had been on prednisone. Does not need controller medication at baseline- will follow.   Primary cancer of right lung [facility] Has close follow up with Dr. Earlie Server for Stage IV low grade neuroendocrine carcinoma, carcinoid tumor. 2nd x-ray appeared worse and I hear decreased breath sounds right lung base- we will ask of oncology of potential benefit of updated CT scan. I think x-ray would be low yield today and did not pursue.   Hypertension Controlled on home amlodipine 5mg   CKD (chronic kidney disease), stage II Hospitalist listed as CKD III- from my view with GFR above 60- this was likely stage II CKD with AKI- improved on repeat labs today with GFR above 60 and Cr back to low 1.3 range.   Pneumonia Finished 7 days of antibiotics. Appears to have resolved by patient appearance and fact he is afebrile and in no respiratory distress. No leukocytosis on cbc today. Lung findings with some decreased breath sounds in R base- once again asking for oncology advise on imaging.    Future Appointments  Date Time Provider [place]  08/28/2017  9:15 AM Curt Bears, MD [facility] None  10/30/2017  3:15 PM Landis Martins, DPM [facility]  11/26/2017  3:30 PM Yong Channel Brayton Mars, MD [facility]  3 month DM follow up advised  Orders Placed This Encounter  Procedures  . CBC with Differential/Platelet  . Comprehensive metabolic panel    [place]  under diabetes  Return precautions advised.  [name], MD

## 2017-08-27 NOTE — Assessment & Plan Note (Signed)
Hospitalist listed as CKD III- from my view with GFR above 60- this was likely stage II CKD with AKI- improved on repeat labs today with GFR above 60 and Cr back to low 1.3 range.

## 2017-08-27 NOTE — Assessment & Plan Note (Signed)
Has close follow up with Dr. Earlie Server for Stage IV low grade neuroendocrine carcinoma, carcinoid tumor. 2nd x-ray appeared worse and I hear decreased breath sounds right lung base- we will ask of oncology of potential benefit of updated CT scan. I think x-ray would be low yield today and did not pursue.

## 2017-08-27 NOTE — Assessment & Plan Note (Signed)
Finished 7 days of antibiotics. Appears to have resolved by patient appearance and fact he is afebrile and in no respiratory distress. No leukocytosis on cbc today. Lung findings with some decreased breath sounds in R base- once again asking for oncology advise on imaging.

## 2017-08-27 NOTE — Patient Instructions (Signed)
Please stop by lab before you go  No changes today- glad sugars are doing better

## 2017-08-27 NOTE — Assessment & Plan Note (Signed)
Controlled on home amlodipine 5mg 

## 2017-08-27 NOTE — Assessment & Plan Note (Signed)
Remains  Reasonably controlled Lab Results  Component Value Date   HGBA1C 7.2 07/05/2017  sugars at home fasting back towards 100 now. Continue metformin and amaryl. If had recurrent hospitalizations would have to reconsider metformin with risk of lactic acidosis and elevation he had in hospital this visit.

## 2017-08-28 ENCOUNTER — Inpatient Hospital Stay: Payer: Medicare Other | Attending: Internal Medicine | Admitting: Internal Medicine

## 2017-08-28 ENCOUNTER — Telehealth: Payer: Self-pay | Admitting: Internal Medicine

## 2017-08-28 ENCOUNTER — Encounter: Payer: Self-pay | Admitting: Internal Medicine

## 2017-08-28 VITALS — BP 152/64 | HR 60 | Temp 97.0°F | Resp 18 | Ht 75.0 in | Wt 188.9 lb

## 2017-08-28 DIAGNOSIS — M109 Gout, unspecified: Secondary | ICD-10-CM

## 2017-08-28 DIAGNOSIS — J189 Pneumonia, unspecified organism: Secondary | ICD-10-CM | POA: Insufficient documentation

## 2017-08-28 DIAGNOSIS — C7B02 Secondary carcinoid tumors of liver: Secondary | ICD-10-CM

## 2017-08-28 DIAGNOSIS — C7A090 Malignant carcinoid tumor of the bronchus and lung: Secondary | ICD-10-CM

## 2017-08-28 DIAGNOSIS — C7A8 Other malignant neuroendocrine tumors: Secondary | ICD-10-CM
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
## 2017-08-28 NOTE — Telephone Encounter (Signed)
Scheduled appt per 1/8 los - Pt did not want AVS or calender printed - [facility] to contact patient with ct schedule - patient is aware if they do not hear from [facility] they need to call them .

## 2017-08-31 ENCOUNTER — Encounter (HOSPITAL_COMMUNITY): Payer: Self-pay

## 2017-08-31 ENCOUNTER — Ambulatory Visit (HOSPITAL_COMMUNITY)
Admission: RE | Admit: 2017-08-31 | Discharge: 2017-08-31 | Disposition: A | Discharge status: Home / Self Care | Payer: Medicare Other | Source: Ambulatory Visit | Attending: Internal Medicine | Admitting: Internal Medicine

## 2017-08-31 DIAGNOSIS — R918 Other nonspecific abnormal finding of lung field: Secondary | ICD-10-CM | POA: Insufficient documentation

## 2017-08-31 DIAGNOSIS — R590 Localized enlarged lymph nodes: Secondary | ICD-10-CM | POA: Insufficient documentation

## 2017-08-31 DIAGNOSIS — C7A00 Malignant carcinoid tumor of unspecified site: Secondary | ICD-10-CM | POA: Diagnosis not present

## 2017-08-31 DIAGNOSIS — C7A8 Other malignant neuroendocrine tumors: Secondary | ICD-10-CM | POA: Insufficient documentation

## 2017-08-31 DIAGNOSIS — K869 Disease of pancreas, unspecified: Secondary | ICD-10-CM | POA: Insufficient documentation

## 2017-08-31 MED ORDER — IOPAMIDOL (ISOVUE-300) INJECTION 61%
INTRAVENOUS | Status: AC
  Administered 2017-08-31: 100 mL
  Filled 2017-08-31: qty 100

## 2017-09-06 ENCOUNTER — Encounter: Payer: Self-pay | Admitting: Oncology

## 2017-09-06 ENCOUNTER — Telehealth: Payer: Self-pay | Admitting: Internal Medicine

## 2017-09-06 ENCOUNTER — Inpatient Hospital Stay (HOSPITAL_BASED_OUTPATIENT_CLINIC_OR_DEPARTMENT_OTHER): Payer: Medicare Other | Admitting: Oncology

## 2017-09-06 VITALS — BP 141/64 | HR 86 | Temp 98.0°F | Resp 17 | Ht 75.0 in | Wt 179.0 lb

## 2017-09-06 DIAGNOSIS — J189 Pneumonia, unspecified organism: Secondary | ICD-10-CM

## 2017-09-06 DIAGNOSIS — C7A8 Other malignant neuroendocrine tumors: Secondary | ICD-10-CM

## 2017-09-06 DIAGNOSIS — M1A09X0 Idiopathic chronic gout, multiple sites, without tophus (tophi): Secondary | ICD-10-CM

## 2017-09-06 DIAGNOSIS — C7A090 Malignant carcinoid tumor of the bronchus and lung: Secondary | ICD-10-CM

## 2017-09-06 DIAGNOSIS — M109 Gout, unspecified: Secondary | ICD-10-CM | POA: Diagnosis not present

## 2017-09-06 DIAGNOSIS — C7B02 Secondary carcinoid tumors of liver: Secondary | ICD-10-CM

## 2017-09-06 DIAGNOSIS — Z5111 Encounter for antineoplastic chemotherapy: Secondary | ICD-10-CM

## 2017-09-06 MED ORDER — INDOMETHACIN 50 MG PO CAPS: ORAL_CAPSULE | ORAL | 0 refills | Status: DC
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
## 2017-09-06 NOTE — Telephone Encounter (Signed)
Gave avs and calendar for february °

## 2017-09-06 NOTE — Assessment & Plan Note (Signed)
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 The patient was started on treatment with Afinitor 10 mg by mouth daily status post 2 months of treatment. This was followed by reduction of his dose to 7.5 mg by mouth daily and he is tolerating this dose much better. The patient tolerated this treatment fairly well. He has been off treatment with Afinitor for the last 4 weeks since his diagnosis of pneumonia. He had a recent restaging CT scan of the chest, abdomen and pelvis.   The patient was seen with Dr. [name].  Restaging CT scan results were discussed with the patient and his ex-wife.  CT scan shows stable disease.  There is an area of inflammation in the lung likely due to prior pneumonia.  Recommend that he resume his treatment with Afinitor 7.5 mg daily. We will plan to bring him back for evaluation and repeat lab work in approximately 1 month.  For his gout, he was given a prescription for indomethacin 50 mg 3 times daily times 1 week.  We recommended that he follow-up with his primary care provider if his gout symptoms do not improve.  He will come back for follow-up visit next week for evaluation and discussion of the scan results. The patient was advised to call immediately if he has any concerning symptoms in the interval. The patient voices understanding of current disease status and treatment options and is in agreement with the current care plan. All questions were answered. The patient knows to call the clinic with any problems, questions or concerns. We can certainly see the patient much sooner if necessary.

## 2017-09-10 ENCOUNTER — Encounter: Payer: Self-pay | Admitting: Adult Health

## 2017-09-10 ENCOUNTER — Ambulatory Visit (INDEPENDENT_AMBULATORY_CARE_PROVIDER_SITE_OTHER): Payer: Medicare Other | Admitting: Adult Health

## 2017-09-10 DIAGNOSIS — J181 Lobar pneumonia, unspecified organism: Secondary | ICD-10-CM | POA: Diagnosis not present

## 2017-09-10 DIAGNOSIS — C3491 Malignant neoplasm of unspecified part of right bronchus or lung: Secondary | ICD-10-CM

## 2017-09-10 DIAGNOSIS — J439 Emphysema, unspecified: Secondary | ICD-10-CM | POA: Diagnosis not present

## 2017-09-10 DIAGNOSIS — J189 Pneumonia, unspecified organism: Secondary | ICD-10-CM
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
## 2017-09-10 NOTE — Assessment & Plan Note (Signed)
Clinically improving after abx.  Recent CT chest reviewed with post-obstructive changes.   Plan  Cont on current regimen .

## 2017-09-10 NOTE — Assessment & Plan Note (Addendum)
Recent flare with PNA -improved back to baseline .

## 2017-09-10 NOTE — Patient Instructions (Signed)
Continue on current regimen  Follow up with Dr. Lamonte Sakai  In 3-4 months and As needed   Please contact office for sooner follow up if symptoms do not improve or worsen or seek emergency care

## 2017-09-10 NOTE — Assessment & Plan Note (Signed)
Cont care with Oncology .

## 2017-10-08 ENCOUNTER — Inpatient Hospital Stay: Payer: Medicare Other

## 2017-10-08 ENCOUNTER — Encounter: Payer: Self-pay | Admitting: Oncology

## 2017-10-08 ENCOUNTER — Telehealth: Payer: Self-pay | Admitting: Oncology

## 2017-10-08 ENCOUNTER — Inpatient Hospital Stay: Payer: Medicare Other | Attending: Internal Medicine | Admitting: Oncology

## 2017-10-08 VITALS — BP 141/73 | HR 77 | Temp 97.9°F | Resp 18 | Ht 75.0 in | Wt 184.5 lb

## 2017-10-08 DIAGNOSIS — C7A8 Other malignant neuroendocrine tumors: Secondary | ICD-10-CM | POA: Insufficient documentation

## 2017-10-08 DIAGNOSIS — R609 Edema, unspecified: Secondary | ICD-10-CM | POA: Diagnosis not present

## 2017-10-08 DIAGNOSIS — C7B8 Other secondary neuroendocrine tumors: Secondary | ICD-10-CM | POA: Insufficient documentation

## 2017-10-08 DIAGNOSIS — E119 Type 2 diabetes mellitus without complications: Secondary | ICD-10-CM | POA: Diagnosis not present

## 2017-10-08 DIAGNOSIS — Z5111 Encounter for antineoplastic chemotherapy: Secondary | ICD-10-CM

## 2017-10-08 LAB — CBC WITH DIFFERENTIAL (CANCER CENTER ONLY)
Basophils Absolute: 0 10*3/uL (ref 0.0–0.1)
Basophils Relative: 1 %
Eosinophils Absolute: 0.1 10*3/uL (ref 0.0–0.5)
Eosinophils Relative: 1 %
HCT: 37 % — ABNORMAL LOW (ref 38.4–49.9)
HEMOGLOBIN: 12.2 g/dL — AB (ref 13.0–17.1)
LYMPHS ABS: 1.9 10*3/uL (ref 0.9–3.3)
LYMPHS PCT: 43 %
MCH: 26.9 pg — AB (ref 27.2–33.4)
MCHC: 32.9 g/dL (ref 32.0–36.0)
MCV: 82 fL (ref 79.3–98.0)
Monocytes Absolute: 0.3 10*3/uL (ref 0.1–0.9)
Monocytes Relative: 6 %
NEUTROS PCT: 49 %
Neutro Abs: 2.2 10*3/uL (ref 1.5–6.5)
Platelet Count: 170 10*3/uL (ref 140–400)
RBC: 4.51 MIL/uL (ref 4.20–5.82)
RDW: 16.8 % — ABNORMAL HIGH (ref 11.0–14.6)
WBC: 4.4 10*3/uL (ref 4.0–10.3)

## 2017-10-08 LAB — CMP (CANCER CENTER ONLY)
ALK PHOS: 83 U/L (ref 40–150)
ALT: 16 U/L (ref 0–55)
AST: 16 U/L (ref 5–34)
Albumin: 3.4 g/dL — ABNORMAL LOW (ref 3.5–5.0)
Anion gap: 11 (ref 3–11)
BUN: 22 mg/dL (ref 7–26)
CO2: 20 mmol/L — ABNORMAL LOW (ref 22–29)
CREATININE: 1.53 mg/dL — AB (ref 0.70–1.30)
Calcium: 8.5 mg/dL (ref 8.4–10.4)
Chloride: 104 mmol/L (ref 98–109)
GFR, EST AFRICAN AMERICAN: 50 mL/min — AB (ref >60)
GFR, EST NON AFRICAN AMERICAN: 43 mL/min — AB (ref >60)
Glucose, Bld: 311 mg/dL — ABNORMAL HIGH (ref 70–140)
Potassium: 3.8 mmol/L (ref 3.5–5.1)
Sodium: 135 mmol/L — ABNORMAL LOW (ref 136–145)
Total Bilirubin: 0.3 mg/dL (ref 0.2–1.2)
Total Protein: 7.2 g/dL (ref 6.4–8.3)

## 2017-10-08 NOTE — Telephone Encounter (Signed)
Scheduled appt per 2/18 los -  Patient is aware of appt date and time.

## 2017-10-08 NOTE — Assessment & Plan Note (Signed)
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 The patient was started on treatment with Afinitor 10 mg by mouth daily status post 2 months of treatment. This was followed by reduction of his dose to 7.5 mg by mouth daily and he is tolerating this dose much better. The patient tolerated this treatment fairly well. The patient was off treatment with Afinitor for about 1 month due to hospitalization for pneumonia and sepsis.  He resumed his treatment 1 month ago and is tolerating it well. Recommend that he continue Afinitor 7.5 mg daily.  He will return in 1 month for evaluation and repeat lab work.  For his elevated glucose, he will continue to follow-up with primary care provider for management of his diabetes.  The patient was advised to call immediately if he has any concerning symptoms in the interval. The patient voices understanding of current disease status and treatment options and is in agreement with the current care plan. All questions were answered. The patient knows to call the clinic with any problems, questions or concerns. We can certainly see the patient much sooner if necessary.
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
## 2017-10-11 ENCOUNTER — Other Ambulatory Visit: Payer: Self-pay | Admitting: Family Medicine

## 2017-10-30 ENCOUNTER — Ambulatory Visit: Payer: Medicare Other | Admitting: Sports Medicine

## 2017-10-31 ENCOUNTER — Other Ambulatory Visit: Payer: Self-pay | Admitting: Medical Oncology

## 2017-10-31 DIAGNOSIS — C7A8 Other malignant neuroendocrine tumors: Secondary | ICD-10-CM

## 2017-10-31 MED ORDER — AFINITOR 7.5 MG PO TABS: 7.5000 mg | ORAL_TABLET | Freq: Every day | ORAL | 2 refills | Status: DC

## 2017-11-06 ENCOUNTER — Ambulatory Visit: Payer: Medicare Other | Admitting: Sports Medicine

## 2017-11-07 ENCOUNTER — Inpatient Hospital Stay: Payer: Medicare Other

## 2017-11-07 ENCOUNTER — Telehealth: Payer: Self-pay

## 2017-11-07 ENCOUNTER — Encounter: Payer: Self-pay | Admitting: Oncology

## 2017-11-07 ENCOUNTER — Inpatient Hospital Stay: Payer: Medicare Other | Attending: Internal Medicine | Admitting: Oncology

## 2017-11-07 VITALS — BP 148/73 | HR 78 | Temp 97.4°F | Resp 20 | Ht 75.0 in | Wt 185.0 lb

## 2017-11-07 DIAGNOSIS — R5383 Other fatigue: Secondary | ICD-10-CM | POA: Diagnosis not present

## 2017-11-07 DIAGNOSIS — C7A8 Other malignant neuroendocrine tumors: Secondary | ICD-10-CM

## 2017-11-07 DIAGNOSIS — R609 Edema, unspecified: Secondary | ICD-10-CM | POA: Insufficient documentation

## 2017-11-07 DIAGNOSIS — E1165 Type 2 diabetes mellitus with hyperglycemia: Secondary | ICD-10-CM | POA: Diagnosis not present

## 2017-11-07 DIAGNOSIS — C7B8 Other secondary neuroendocrine tumors: Secondary | ICD-10-CM | POA: Diagnosis not present

## 2017-11-07 DIAGNOSIS — Z5111 Encounter for antineoplastic chemotherapy: Secondary | ICD-10-CM

## 2017-11-07 LAB — CMP (CANCER CENTER ONLY)
ALK PHOS: 75 U/L (ref 40–150)
ALT: 22 U/L (ref 0–55)
AST: 24 U/L (ref 5–34)
Albumin: 3.3 g/dL — ABNORMAL LOW (ref 3.5–5.0)
Anion gap: 9 (ref 3–11)
BUN: 15 mg/dL (ref 7–26)
CALCIUM: 8.7 mg/dL (ref 8.4–10.4)
CO2: 23 mmol/L (ref 22–29)
CREATININE: 1.44 mg/dL — AB (ref 0.70–1.30)
Chloride: 104 mmol/L (ref 98–109)
GFR, EST AFRICAN AMERICAN: 54 mL/min — AB (ref >60)
GFR, Estimated: 47 mL/min — ABNORMAL LOW (ref >60)
Glucose, Bld: 249 mg/dL — ABNORMAL HIGH (ref 70–140)
Potassium: 4.1 mmol/L (ref 3.5–5.1)
SODIUM: 136 mmol/L (ref 136–145)
Total Bilirubin: 0.3 mg/dL (ref 0.2–1.2)
Total Protein: 7 g/dL (ref 6.4–8.3)

## 2017-11-07 LAB — CBC WITH DIFFERENTIAL (CANCER CENTER ONLY)
Basophils Absolute: 0 10*3/uL (ref 0.0–0.1)
Basophils Relative: 1 %
EOS ABS: 0 10*3/uL (ref 0.0–0.5)
Eosinophils Relative: 1 %
HCT: 35.3 % — ABNORMAL LOW (ref 38.4–49.9)
HEMOGLOBIN: 11.9 g/dL — AB (ref 13.0–17.1)
LYMPHS ABS: 2.1 10*3/uL (ref 0.9–3.3)
Lymphocytes Relative: 56 %
MCH: 27.3 pg (ref 27.2–33.4)
MCHC: 33.7 g/dL (ref 32.0–36.0)
MCV: 81 fL (ref 79.3–98.0)
Monocytes Absolute: 0 10*3/uL — ABNORMAL LOW (ref 0.1–0.9)
Monocytes Relative: 0 %
Neutro Abs: 1.6 10*3/uL (ref 1.5–6.5)
Neutrophils Relative %: 42 %
Platelet Count: 150 10*3/uL (ref 140–400)
RBC: 4.36 MIL/uL (ref 4.20–5.82)
RDW: 15.1 % — ABNORMAL HIGH (ref 11.0–14.6)
WBC: 3.8 10*3/uL — AB (ref 4.0–10.3)
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
## 2017-11-07 NOTE — Telephone Encounter (Signed)
Printed avs and calender of upcoming appointment. Per 3/20 los also gave patient contrast.

## 2017-11-08 NOTE — Assessment & Plan Note (Signed)
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 The patient was started on treatment with Afinitor 10 mg by mouth daily status post 2 months of treatment. This was followed by reduction of his dose to 7.5 mg by mouth daily and he is tolerating this dose much better. The patienttolerated thistreatment fairly well. The patient was off treatment with Afinitor for about 1 month due to hospitalization for pneumonia and sepsis.  He resumed his treatment 2 months ago and is tolerating it well with the exception of mild fatigue, and trace lower extremity edema.  The patient was seen with Dr. [name].  We discussed that the side effects of treatment overall seem tolerable for the patient.  The patient was given the option of stopping treatment, but does not want to. Recommend that he continue Afinitor 7.5 mg daily.  Patient will have a restaging CT scan of the chest, abdomen, pelvis in approximately 1 month.  He will follow-up 1-2 days after the scan to discuss the results.  For his elevated glucose, he will continue to follow-up with primary care provider for management of his diabetes.  The patient was advised to call immediately if he has any concerning symptoms in the interval. The patient voices understanding of current disease status and treatment options and is in agreement with the current care plan. All questions were answered. The patient knows to call the clinic with any problems, questions or concerns. We can certainly see the patient much sooner if necessary.

## 2017-11-26 ENCOUNTER — Encounter: Payer: Self-pay | Admitting: Family Medicine

## 2017-11-26 ENCOUNTER — Ambulatory Visit (INDEPENDENT_AMBULATORY_CARE_PROVIDER_SITE_OTHER): Payer: Medicare Other | Admitting: Family Medicine

## 2017-11-26 VITALS — BP 138/86 | HR 77 | Temp 97.6°F | Ht 75.0 in | Wt 181.4 lb

## 2017-11-26 DIAGNOSIS — E119 Type 2 diabetes mellitus without complications: Secondary | ICD-10-CM

## 2017-11-26 DIAGNOSIS — I10 Essential (primary) hypertension: Secondary | ICD-10-CM

## 2017-11-26 DIAGNOSIS — M1A09X0 Idiopathic chronic gout, multiple sites, without tophus (tophi): Secondary | ICD-10-CM

## 2017-11-26 LAB — POCT GLYCOSYLATED HEMOGLOBIN (HGB A1C): HEMOGLOBIN A1C: 7.8

## 2017-11-26 NOTE — Progress Notes (Signed)
Subjective:  Richard Navarro is a 72 y.o. year old very pleasant male patient who presents for/with See problem oriented charting ROS- still with some shortness of breath and some cough. No chest pain. Noted edema.    Past Medical History-  Patient Active Problem List   Diagnosis Date Noted  . 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 Primary cancer of right lung ([place]) 12/22/2016    Priority: High  . Diabetes mellitus without complication ([place])     Priority: High  . Pulmonary emphysema ([place]) 12/01/2016    Priority: Medium  . PTSD (post-traumatic stress disorder) 12/07/2015    Priority: Medium  . Erectile dysfunction 04/09/2014    Priority: Medium  . Tobacco abuse 03/26/2014    Priority: Medium  . Hypertension     Priority: Medium  . Gout     Priority: Medium  . Depression     Priority: Medium  . Hyperlipidemia     Priority: Medium  . Encounter for antineoplastic chemotherapy 01/10/2017    Priority: Low  . Goals of care, counseling/discussion 01/10/2017    Priority: Low  . CKD (chronic kidney disease), stage II 04/17/2014    Priority: Low  . Prostate cancer screening 04/17/2014    Priority: Low  . Arthritis 03/26/2014    Priority: Low  . History of adenomatous polyp of colon 03/26/2014    Priority: Low  . GERD (gastroesophageal reflux disease)     Priority: Low  . Pneumonia 08/09/2017  . Tachycardia 08/09/2017  . Urinary frequency 04/27/2017    Medications- reviewed and updated Current Outpatient Medications  Medication Sig Dispense Refill  . AFINITOR 7.5 MG tablet Take 1 tablet (7.5 mg total) by mouth daily. 90 tablet 2  . allopurinol (ZYLOPRIM) 100 MG tablet TAKE 1 TABLET DAILY 90 tablet 3  . amLODipine (NORVASC) 5 MG tablet Take 1 tablet (5 mg total) daily by mouth. 90 tablet 3  . aspirin 81 MG tablet Take 1 tablet (81 mg total) by mouth daily. 90 tablet 3  . glimepiride (AMARYL) 2 MG tablet Take 1 tablet (2 mg total) daily before  breakfast by mouth. (Patient taking differently: Take 1 mg by mouth daily before breakfast. ) 30 tablet 5  . glucose blood test strip Use to test blood sugar twice a day 200 each 3  . indomethacin (INDOCIN) 50 MG capsule Take 1 capsule 3 times a day with meals X1 week 21 capsule 0  . loperamide (IMODIUM A-D) 2 MG tablet Take 1 mg by mouth daily as needed for diarrhea or loose stools.    . magic mouthwash w/lidocaine SOLN Swish and swallow 5 cc by mouth every six hours for mouth sores. 240 mL 1  . metFORMIN (GLUCOPHAGE) 500 MG tablet Take 1 tablet (500 mg total) 2 (two) times daily with a meal by mouth. 180 tablet 3  . pantoprazole (PROTONIX) 40 MG tablet Take 1 tablet (40 mg total) daily by mouth. 90 tablet 3  . Tetrahydrozoline HCl (EYE DROPS OP) Place 1 drop into both eyes daily as needed (redness relief).    . vitamin B-12 (CYANOCOBALAMIN) 1000 MCG tablet Take 1,000 mcg by mouth daily.    . vitamin C (ASCORBIC ACID) 500 MG tablet Take 500 mg by mouth daily.    . VOLTAREN 1 % GEL APPLY 2 GRAMS TOPICALLY FOUR TIMES A DAY AS NEEDED FOR LEFT SHOULDER ARTHRITIS 300 g 3   No current facility-administered medications for this visit.  Objective: BP 138/86 (BP Location: Left Arm, Patient Position: Sitting, Cuff Size: Normal)   Pulse 77   Temp 97.6 F (36.4 C) (Oral)   Ht 6\' 3"  (1.905 m)   Wt 181 lb 6.4 oz (82.3 kg)   SpO2 96%   BMI 22.67 kg/m  Gen: NAD, resting comfortably CV: RRR no murmurs rubs or gallops Lungs: no crackles, wheeze, rhonchi Abdomen: soft/nontender/thin Ext: 1+ pitting edema Skin: warm, dry Neuro: normal gait Diabetic Foot Exam - Simple   Simple Foot Form Diabetic Foot exam was performed with the following findings:  Yes 11/26/2017  4:13 PM  Visual Inspection No deformities, no ulcerations, no other skin breakdown bilaterally:  Yes Sensation Testing Intact to touch and monofilament testing bilaterally:  Yes Pulse Check Posterior Tibialis and Dorsalis pulse intact  bilaterally:  Yes Comments    Assessment/Plan:  Other notes 1. venous insufficency. Try knee high compression stockings  Hypertension S: controlled on amlodipine 5mg  BP Readings from Last 3 Encounters:  11/26/17 138/86  11/07/17 (!) 148/73  10/08/17 (!) 141/73  A/P: We discussed blood pressure goal of <140/90. Continue current meds  Diabetes mellitus without complication ([employer]) S: poorly controlled. On metformin and amaryl only on 1mg - until last week went to full 2mg . If recurrent hospitalizations would need to stop metformin due to lactic acidosis risk.  CBGs- 131-189 fasting this month fasting. Probably over march averaging over 150 fasting.  Lab Results  Component Value Date   HGBA1C 7.2 07/05/2017   HGBA1C 7.8 03/30/2017   HGBA1C 6.6 (H) 10/24/2016    A/P: want to avoid hypogylcemia- will only stay with 2 mg amaryl (increase in last 2 weeks from 1 mg) in addition to baseline metformin and follow up in 14 weeks or so.   Gout 1 gout flare in left elbow after eating grouper and crab meat. Indocin helped with flare. Still on allopurinol. Discussed dietary modification instead of increasing allopurinol at this point   Future Appointments  Date Time Provider [place]  11/27/2017  1:45 PM Landis Martins, DPM [facility]  11/28/2017  3:00 PM Collene Gobble, MD [facility] None  12/05/2017  9:30 AM [facility] 4 [facility] None  12/05/2017 10:30 AM [facility] [place]  12/06/2017  9:00 AM Maryanna Shape, NP [facility] None  12/13/2017  4:00 PM Williemae Area, RN [facility]  03/04/2018  3:30 PM [name], MD [facility]   Lab/Order associations: Diabetes mellitus without complication ([place]) - Plan: POCT glycosylated hemoglobin (Hb A1C)  Return precautions advised.  [name], MD

## 2017-11-26 NOTE — Assessment & Plan Note (Signed)
S: poorly controlled. On metformin and amaryl only on 1mg - until last week went to full 2mg . If recurrent hospitalizations would need to stop metformin due to lactic acidosis risk.  CBGs- 131-189 fasting this month fasting. Probably over march averaging over 150 fasting.  Lab Results  Component Value Date   HGBA1C 7.2 07/05/2017   HGBA1C 7.8 03/30/2017   HGBA1C 6.6 (H) 10/24/2016    A/P: want to avoid hypogylcemia- will only stay with 2 mg amaryl (increase in last 2 weeks from 1 mg) in addition to baseline metformin and follow up in 14 weeks or so.

## 2017-11-26 NOTE — Patient Instructions (Addendum)
I would also like for you to sign up for an annual wellness visit with one of our nurses, Cassie or Manuela Schwartz, who both specialize in the annual wellness visit. This is a free benefit under medicare that may help [place] find additional ways to help you. Some highlights are reviewing medications, lifestyle, and doing a dementia screen.   a1c of 7.8. Lets increase your glimepiride to full 2 mg. See me back in 14 weeks and we will recheck a1c

## 2017-11-26 NOTE — Assessment & Plan Note (Signed)
1 gout flare in left elbow after eating grouper and crab meat. Indocin helped with flare. Still on allopurinol. Discussed dietary modification instead of increasing allopurinol at this point

## 2017-11-27 ENCOUNTER — Encounter: Payer: Self-pay | Admitting: Sports Medicine

## 2017-11-27 ENCOUNTER — Ambulatory Visit (INDEPENDENT_AMBULATORY_CARE_PROVIDER_SITE_OTHER): Payer: Medicare Other | Admitting: Sports Medicine

## 2017-11-27 DIAGNOSIS — E119 Type 2 diabetes mellitus without complications: Secondary | ICD-10-CM

## 2017-11-27 DIAGNOSIS — M79675 Pain in left toe(s): Secondary | ICD-10-CM | POA: Diagnosis not present

## 2017-11-27 DIAGNOSIS — B351 Tinea unguium: Secondary | ICD-10-CM

## 2017-11-27 DIAGNOSIS — I739 Peripheral vascular disease, unspecified: Secondary | ICD-10-CM

## 2017-11-27 DIAGNOSIS — M79674 Pain in right toe(s): Secondary | ICD-10-CM

## 2017-11-27 NOTE — Progress Notes (Signed)
Subjective: Richard Navarro is a 72 y.o. male patient returns to office for evaluation of Left ankle pain and for Diabetic nail trim. Reports continued swelling in ankles and is to get compression stockings as recommended by PCP. Patient denies any other issues at this time.   FBS 159 and, a1c 7.8  Patient Active Problem List   Diagnosis Date Noted  . Pneumonia 08/09/2017  . 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 Tachycardia 08/09/2017  . Urinary frequency 04/27/2017  . Encounter for antineoplastic chemotherapy 01/10/2017  . Goals of care, counseling/discussion 01/10/2017  . Primary cancer of right lung ([place]) 12/22/2016  . Pulmonary emphysema ([place]) 12/01/2016  . PTSD (post-traumatic stress disorder) 12/07/2015  . 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 Prostate cancer screening 04/17/2014  . Erectile dysfunction 04/09/2014  . Arthritis 03/26/2014  . History of adenomatous polyp of colon 03/26/2014  . Tobacco abuse 03/26/2014  . Diabetes mellitus without complication ([place])   . Hypertension   . Gout   . Depression   . GERD (gastroesophageal reflux disease)   . Hyperlipidemia     Current Outpatient Medications on File Prior to Visit  Medication Sig Dispense Refill  . AFINITOR 7.5 MG tablet Take 1 tablet (7.5 mg total) by mouth daily. 90 tablet 2  . allopurinol (ZYLOPRIM) 100 MG tablet TAKE 1 TABLET DAILY 90 tablet 3  . amLODipine (NORVASC) 5 MG tablet Take 1 tablet (5 mg total) daily by mouth. 90 tablet 3  . aspirin 81 MG tablet Take 1 tablet (81 mg total) by mouth daily. 90 tablet 3  . glimepiride (AMARYL) 2 MG tablet Take 1 tablet (2 mg total) daily before breakfast by mouth. (Patient taking differently: Take 1 mg by mouth daily before breakfast. ) 30 tablet 5  . glucose blood test strip Use to test blood sugar twice a day 200 each 3  . indomethacin (INDOCIN) 50 MG capsule Take 1 capsule 3 times a day with meals X1 week 21 capsule 0  . loperamide (IMODIUM  A-D) 2 MG tablet Take 1 mg by mouth daily as needed for diarrhea or loose stools.    . magic mouthwash w/lidocaine SOLN Swish and swallow 5 cc by mouth every six hours for mouth sores. 240 mL 1  . metFORMIN (GLUCOPHAGE) 500 MG tablet Take 1 tablet (500 mg total) 2 (two) times daily with a meal by mouth. 180 tablet 3  . pantoprazole (PROTONIX) 40 MG tablet Take 1 tablet (40 mg total) daily by mouth. 90 tablet 3  . Tetrahydrozoline HCl (EYE DROPS OP) Place 1 drop into both eyes daily as needed (redness relief).    . vitamin B-12 (CYANOCOBALAMIN) 1000 MCG tablet Take 1,000 mcg by mouth daily.    . vitamin C (ASCORBIC ACID) 500 MG tablet Take 500 mg by mouth daily.    . VOLTAREN 1 % GEL APPLY 2 GRAMS TOPICALLY FOUR TIMES A DAY AS NEEDED FOR LEFT SHOULDER ARTHRITIS 300 g 3   No current facility-administered medications on file prior to visit.     Allergies  Allergen Reactions  . Lisinopril Swelling    Angioedema- on this and afinitor same time  . Simvastatin Other (See Comments)    Joint ache    Objective:  General: Alert and oriented x3 in no acute distress  Dermatology: No open lesions bilateral lower extremities, no webspace macerations, no ecchymosis bilateral, all nails x 10 are mildly thickened and elongated.   Vascular: Dorsalis Pedis and Posterior Tibial pedal  pulses 1/4, Capillary Fill Time 5 seconds,Scant pedal hair growth bilateral, +1 pitting edema bilateral lower extremities L=R, Temperature gradient within normal limits.  Neurology: Johney Maine sensation intact via light touch bilateral.  Musculoskeletal: Diffuse minimal tenderness to swollen left ankle with venous pigmentation, no redness, no warmth no concerns for gout, Semi-flexible hammertoes 2-5 with no tenderness with palpation at PIPJ Left 2nd toe. No other sympathic pedal deformities noted bilateral. No pain with calf compression bilateral.  Strength within normal limits in all groups bilateral.        Assessment and  Plan: Problem List Items Addressed This Visit      Endocrine   Diabetes mellitus without complication ([place]) - Primary    Other Visit Diagnoses    Pain due to onychomycosis of toenails of both feet       PVD (peripheral vascular disease) ([place])         -Complete examination performed -Discussed continued care for PVD  -Continue with compression anklet for edema control and patient to get stockings as Rx by PCP -Mechanically debrided nails x 10 using sterile nail nipper without incident  -Patient to return to office in 12 weeks for Diabetic nail care or sooner if condition worsens.  Landis Martins, DPM

## 2017-11-28 ENCOUNTER — Encounter: Payer: Self-pay | Admitting: Emergency Medicine

## 2017-11-28 ENCOUNTER — Ambulatory Visit (INDEPENDENT_AMBULATORY_CARE_PROVIDER_SITE_OTHER): Payer: Medicare Other | Admitting: Emergency Medicine

## 2017-11-28 DIAGNOSIS — J449 Chronic obstructive pulmonary disease, unspecified: Secondary | ICD-10-CM

## 2017-11-28 DIAGNOSIS — C3491 Malignant neoplasm of unspecified part of right bronchus or lung: Secondary | ICD-10-CM

## 2017-11-28 MED ORDER — ALBUTEROL SULFATE HFA 108 (90 BASE) MCG/ACT IN AERS: 2.0000 | INHALATION_SPRAY | RESPIRATORY_TRACT | 0 refills | Status: DC | PRN

## 2017-11-28 NOTE — Progress Notes (Signed)
Patient seen in the office today and instructed on use of Albuterol HFA.  Patient expressed understanding and demonstrated technique.  

## 2017-11-28 NOTE — Assessment & Plan Note (Signed)
Minimal sx. No flares since last visit here but note hospitalization for severe CAP in 07/2017. Not on scheduled BD's, appears to be well compensated. Will give him albuterol to have available prn.   We will prescribe an albuterol inhaler for you to use 2 puffs if needed for shortness of breath, wheezing or chest tightness. Keep this available for emergencies.  Follow with Dr Lamonte Sakai in 6 months or sooner if you have any problems
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
## 2017-11-28 NOTE — Patient Instructions (Addendum)
Please continue to follow with Dr [name] and get your repeat Ct chest as planned.  We will prescribe an albuterol inhaler for you to use 2 puffs if needed for shortness of breath, wheezing or chest tightness. Keep this available for emergencies.  Follow with Dr Lamonte Sakai in 6 months or sooner if you have any problems

## 2017-11-28 NOTE — Assessment & Plan Note (Signed)
Followed by Dr [name] on [place], serial Ct's

## 2017-12-05 ENCOUNTER — Encounter (HOSPITAL_COMMUNITY): Payer: Self-pay

## 2017-12-05 ENCOUNTER — Inpatient Hospital Stay: Payer: Medicare Other | Attending: Internal Medicine

## 2017-12-05 ENCOUNTER — Ambulatory Visit (HOSPITAL_COMMUNITY)
Admission: RE | Admit: 2017-12-05 | Discharge: 2017-12-05 | Disposition: A | Discharge status: Home / Self Care | Payer: Medicare Other | Source: Ambulatory Visit | Attending: Oncology | Admitting: Oncology

## 2017-12-05 DIAGNOSIS — C787 Secondary malignant neoplasm of liver and intrahepatic bile duct: Secondary | ICD-10-CM | POA: Diagnosis not present

## 2017-12-05 DIAGNOSIS — Z79899 Other long term (current) drug therapy: Secondary | ICD-10-CM | POA: Insufficient documentation

## 2017-12-05 DIAGNOSIS — R918 Other nonspecific abnormal finding of lung field: Secondary | ICD-10-CM | POA: Insufficient documentation

## 2017-12-05 DIAGNOSIS — R5383 Other fatigue: Secondary | ICD-10-CM | POA: Insufficient documentation

## 2017-12-05 DIAGNOSIS — R609 Edema, unspecified: Secondary | ICD-10-CM | POA: Insufficient documentation

## 2017-12-05 DIAGNOSIS — C7B8 Other secondary neuroendocrine tumors: Secondary | ICD-10-CM | POA: Diagnosis not present

## 2017-12-05 DIAGNOSIS — C7A8 Other malignant neuroendocrine tumors: Secondary | ICD-10-CM | POA: Insufficient documentation

## 2017-12-05 DIAGNOSIS — M899 Disorder of bone, unspecified: Secondary | ICD-10-CM | POA: Diagnosis not present

## 2017-12-05 DIAGNOSIS — R160 Hepatomegaly, not elsewhere classified: Secondary | ICD-10-CM | POA: Insufficient documentation

## 2017-12-05 DIAGNOSIS — C3491 Malignant neoplasm of unspecified part of right bronchus or lung: Secondary | ICD-10-CM | POA: Insufficient documentation

## 2017-12-05 LAB — CBC WITH DIFFERENTIAL (CANCER CENTER ONLY)
BASOS ABS: 0 10*3/uL (ref 0.0–0.1)
BASOS PCT: 0 %
EOS ABS: 0 10*3/uL (ref 0.0–0.5)
Eosinophils Relative: 1 %
HCT: 37.9 % — ABNORMAL LOW (ref 38.4–49.9)
HEMOGLOBIN: 12.6 g/dL — AB (ref 13.0–17.1)
Lymphocytes Relative: 49 %
Lymphs Abs: 1.8 10*3/uL (ref 0.9–3.3)
MCH: 26.7 pg — ABNORMAL LOW (ref 27.2–33.4)
MCHC: 33.2 g/dL (ref 32.0–36.0)
MCV: 80.3 fL (ref 79.3–98.0)
Monocytes Absolute: 0.3 10*3/uL (ref 0.1–0.9)
Monocytes Relative: 8 %
NEUTROS PCT: 42 %
Neutro Abs: 1.6 10*3/uL (ref 1.5–6.5)
PLATELETS: 146 10*3/uL (ref 140–400)
RBC: 4.72 MIL/uL (ref 4.20–5.82)
RDW: 14.7 % — ABNORMAL HIGH (ref 11.0–14.6)
WBC: 3.7 10*3/uL — AB (ref 4.0–10.3)

## 2017-12-05 LAB — CMP (CANCER CENTER ONLY)
ALK PHOS: 89 U/L (ref 40–150)
ALT: 22 U/L (ref 0–55)
AST: 19 U/L (ref 5–34)
Albumin: 3.7 g/dL (ref 3.5–5.0)
Anion gap: 8 (ref 3–11)
BILIRUBIN TOTAL: 0.3 mg/dL (ref 0.2–1.2)
BUN: 18 mg/dL (ref 7–26)
CALCIUM: 9 mg/dL (ref 8.4–10.4)
CHLORIDE: 106 mmol/L (ref 98–109)
CO2: 26 mmol/L (ref 22–29)
CREATININE: 1.59 mg/dL — AB (ref 0.70–1.30)
GFR, EST AFRICAN AMERICAN: 48 mL/min — AB (ref >60)
GFR, EST NON AFRICAN AMERICAN: 41 mL/min — AB (ref >60)
Glucose, Bld: 179 mg/dL — ABNORMAL HIGH (ref 70–140)
Potassium: 4.2 mmol/L (ref 3.5–5.1)
Sodium: 140 mmol/L (ref 136–145)
TOTAL PROTEIN: 7.6 g/dL (ref 6.4–8.3)

## 2017-12-05 MED ORDER — IOHEXOL 300 MG/ML  SOLN
100.0000 mL | Freq: Once | INTRAMUSCULAR | Status: AC | PRN
  Administered 2017-12-05: 80 mL via INTRAVENOUS

## 2017-12-06 ENCOUNTER — Telehealth: Payer: Self-pay | Admitting: Oncology

## 2017-12-06 ENCOUNTER — Inpatient Hospital Stay (HOSPITAL_BASED_OUTPATIENT_CLINIC_OR_DEPARTMENT_OTHER): Payer: Medicare Other | Admitting: Oncology

## 2017-12-06 ENCOUNTER — Encounter: Payer: Self-pay | Admitting: Oncology

## 2017-12-06 VITALS — BP 149/56 | HR 93 | Temp 98.3°F | Resp 18 | Ht 75.0 in | Wt 186.1 lb

## 2017-12-06 DIAGNOSIS — C7A8 Other malignant neuroendocrine tumors: Secondary | ICD-10-CM

## 2017-12-06 DIAGNOSIS — C7B8 Other secondary neuroendocrine tumors: Secondary | ICD-10-CM

## 2017-12-06 DIAGNOSIS — Z5111 Encounter for antineoplastic chemotherapy: Secondary | ICD-10-CM

## 2017-12-06 DIAGNOSIS — R609 Edema, unspecified: Secondary | ICD-10-CM

## 2017-12-06 DIAGNOSIS — Z7189 Other specified counseling: Secondary | ICD-10-CM

## 2017-12-06 DIAGNOSIS — R5383 Other fatigue: Secondary | ICD-10-CM

## 2017-12-06 DIAGNOSIS — Z79899 Other long term (current) drug therapy: Secondary | ICD-10-CM | POA: Diagnosis not present

## 2017-12-06 NOTE — Telephone Encounter (Signed)
Scheduled appt per 4/18 los - gave patient AVS and calender per los.
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
## 2017-12-07 NOTE — Assessment & Plan Note (Signed)
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 The patient was started on treatment with Afinitor 10 mg by mouth daily status post 2 months of treatment. This was followed by reduction of his dose to 7.5 mg by mouth daily and he is tolerating this dose much better. The patienttolerated thistreatment fairly well. The patient wasoff treatment with Afinitor for about 1 month due to hospitalization for pneumonia and sepsis. He resumed his treatment 2 months ago and is tolerating it well with the exception of mild fatigue, and trace lower extremity edema. He had recent restaging CT scans and is here to discuss the results.  The patient was seen with Dr. [name].  CT scan results were discussed with the patient and his ex-wife which show improvement in his disease  overall, but he does have an enlarging lesion in the liver.  Recommend that he continue treatment with Afinitor at the current dose since he is having some response.  A referral has been made to interventional radiology for consideration of Y 90.  Patient is in agreement to continuing on his current treatment without any dose changes. He will follow-up in 1 month for evaluation and repeat lab work.  The patient was advised to call immediately if he has any concerning symptoms in the interval. The patient voices understanding of current disease status and treatment options and is in agreement with the current care plan. All questions were answered. The patient knows to call the clinic with any problems, questions or concerns. We can certainly see the patient much sooner if necessary.

## 2017-12-10 ENCOUNTER — Emergency Department (HOSPITAL_COMMUNITY): Payer: Medicare Other

## 2017-12-10 ENCOUNTER — Other Ambulatory Visit: Payer: Self-pay | Admitting: *Deleted

## 2017-12-10 ENCOUNTER — Emergency Department (HOSPITAL_COMMUNITY)
Admission: EM | Admit: 2017-12-10 | Discharge: 2017-12-10 | Disposition: A | Discharge status: Home / Self Care | Payer: Medicare Other | Attending: Emergency Medicine | Admitting: Emergency Medicine

## 2017-12-10 ENCOUNTER — Encounter (HOSPITAL_COMMUNITY): Payer: Self-pay | Admitting: *Deleted

## 2017-12-10 DIAGNOSIS — N182 Chronic kidney disease, stage 2 (mild): Secondary | ICD-10-CM | POA: Insufficient documentation

## 2017-12-10 DIAGNOSIS — J449 Chronic obstructive pulmonary disease, unspecified: Secondary | ICD-10-CM | POA: Diagnosis not present

## 2017-12-10 DIAGNOSIS — C787 Secondary malignant neoplasm of liver and intrahepatic bile duct: Secondary | ICD-10-CM | POA: Insufficient documentation

## 2017-12-10 DIAGNOSIS — R509 Fever, unspecified: Secondary | ICD-10-CM | POA: Insufficient documentation

## 2017-12-10 DIAGNOSIS — Z79899 Other long term (current) drug therapy: Secondary | ICD-10-CM | POA: Insufficient documentation

## 2017-12-10 DIAGNOSIS — Z87891 Personal history of nicotine dependence: Secondary | ICD-10-CM | POA: Insufficient documentation

## 2017-12-10 DIAGNOSIS — B370 Candidal stomatitis: Secondary | ICD-10-CM

## 2017-12-10 DIAGNOSIS — I129 Hypertensive chronic kidney disease with stage 1 through stage 4 chronic kidney disease, or unspecified chronic kidney disease: Secondary | ICD-10-CM | POA: Insufficient documentation

## 2017-12-10 DIAGNOSIS — C3490 Malignant neoplasm of unspecified part of unspecified bronchus or lung: Secondary | ICD-10-CM | POA: Insufficient documentation

## 2017-12-10 DIAGNOSIS — E119 Type 2 diabetes mellitus without complications: Secondary | ICD-10-CM | POA: Insufficient documentation

## 2017-12-10 DIAGNOSIS — C7A8 Other malignant neuroendocrine tumors: Secondary | ICD-10-CM

## 2017-12-10 LAB — CBC WITH DIFFERENTIAL/PLATELET
Basophils Absolute: 0 10*3/uL (ref 0.0–0.1)
Basophils Relative: 0 %
EOS ABS: 0 10*3/uL (ref 0.0–0.7)
EOS PCT: 1 %
HCT: 33.3 % — ABNORMAL LOW (ref 39.0–52.0)
HEMOGLOBIN: 11.1 g/dL — AB (ref 13.0–17.0)
LYMPHS PCT: 43 %
Lymphs Abs: 2.1 10*3/uL (ref 0.7–4.0)
MCH: 26.6 pg (ref 26.0–34.0)
MCHC: 33.3 g/dL (ref 30.0–36.0)
MCV: 79.7 fL (ref 78.0–100.0)
MONOS PCT: 7 %
Monocytes Absolute: 0.4 10*3/uL (ref 0.1–1.0)
Neutro Abs: 2.5 10*3/uL (ref 1.7–7.7)
Neutrophils Relative %: 49 %
Platelets: 157 10*3/uL (ref 150–400)
RBC: 4.18 MIL/uL — AB (ref 4.22–5.81)
RDW: 14.6 % (ref 11.5–15.5)
WBC: 5 10*3/uL (ref 4.0–10.5)

## 2017-12-10 LAB — URINALYSIS, ROUTINE W REFLEX MICROSCOPIC
Bilirubin Urine: NEGATIVE
GLUCOSE, UA: NEGATIVE mg/dL
HGB URINE DIPSTICK: NEGATIVE
Ketones, ur: NEGATIVE mg/dL
LEUKOCYTES UA: NEGATIVE
Nitrite: NEGATIVE
PH: 7 (ref 5.0–8.0)
Protein, ur: NEGATIVE mg/dL
SPECIFIC GRAVITY, URINE: 1.019 (ref 1.005–1.030)

## 2017-12-10 LAB — BASIC METABOLIC PANEL
Anion gap: 11 (ref 5–15)
BUN: 14 mg/dL (ref 6–20)
CHLORIDE: 105 mmol/L (ref 101–111)
CO2: 23 mmol/L (ref 22–32)
CREATININE: 1.3 mg/dL — AB (ref 0.61–1.24)
Calcium: 8.5 mg/dL — ABNORMAL LOW (ref 8.9–10.3)
GFR calc Af Amer: >60 mL/min (ref >60)
GFR calc non Af Amer: 53 mL/min — ABNORMAL LOW (ref >60)
GLUCOSE: 169 mg/dL — AB (ref 65–99)
POTASSIUM: 3.5 mmol/L (ref 3.5–5.1)
SODIUM: 139 mmol/L (ref 135–145)

## 2017-12-10 LAB — I-STAT CG4 LACTIC ACID, ED: Lactic Acid, Venous: 0.83 mmol/L (ref 0.5–1.9)

## 2017-12-10 MED ORDER — IOPAMIDOL (ISOVUE-370) INJECTION 76%
100.0000 mL | Freq: Once | INTRAVENOUS | Status: AC | PRN
  Administered 2017-12-10: 100 mL via INTRAVENOUS

## 2017-12-10 MED ORDER — SODIUM CHLORIDE 0.9 % IJ SOLN
INTRAMUSCULAR | Status: AC
  Filled 2017-12-10: qty 50

## 2017-12-10 MED ORDER — FLUCONAZOLE 200 MG PO TABS: 200.0000 mg | ORAL_TABLET | Freq: Every day | ORAL | 0 refills | Status: AC

## 2017-12-10 MED ORDER — FLUCONAZOLE 200 MG PO TABS: 200.0000 mg | ORAL_TABLET | Freq: Every day | ORAL | 0 refills | Status: DC

## 2017-12-10 MED ORDER — IOPAMIDOL (ISOVUE-370) INJECTION 76%
INTRAVENOUS | Status: AC
  Administered 2017-12-10: 100 mL via INTRAVENOUS
  Filled 2017-12-10: qty 100

## 2017-12-10 NOTE — ED Provider Notes (Signed)
Medical screening examination/treatment/procedure(s) were conducted as a shared visit with non-physician practitioner(s) and myself.  I personally evaluated the patient during the encounter.  None 72 year old male presents with increase in temperature at home up to 100.2.  Does have a history of cancer and is currently taking oral chemotherapy drugs daily.  Mild cough.  Also has some hemoptysis.  Chest CT without acute findings.  Will check urine and lactate here.  He also has possible oral thrush.  Will place on Diflucan and discharged home pending lab results   Lacretia Leigh, MD 12/10/17 1043

## 2017-12-10 NOTE — ED Notes (Signed)
Patient transported to CT 

## 2017-12-10 NOTE — ED Triage Notes (Signed)
Ex-spouse stated "he woke up with a sore throat Friday and started running a fever Sunday evening.  He has lung & liver cancer.  Takes Afinitor daily."   Pt stated "I have a deep orange colored phlegm when I cough."
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
## 2017-12-10 NOTE — Discharge Instructions (Addendum)
Your blood work and CT scan were reassuring.  Please take Diflucan 200 mg daily for the next week.  Follow-up with your oncologist regarding today's ER visit.  Return to the emergency department if you have any new or concerning symptoms like fever greater than 100.4 f, worsening cough or shortness of breath, trouble swallowing.

## 2017-12-11 LAB — URINE CULTURE: Culture: 20000 — AB

## 2017-12-12 ENCOUNTER — Other Ambulatory Visit: Payer: Self-pay | Admitting: Interventional Radiology

## 2017-12-12 ENCOUNTER — Other Ambulatory Visit (HOSPITAL_COMMUNITY): Payer: Self-pay | Admitting: Interventional Radiology

## 2017-12-12 ENCOUNTER — Ambulatory Visit
Admission: RE | Admit: 2017-12-12 | Discharge: 2017-12-12 | Disposition: A | Discharge status: Home / Self Care | Payer: Medicare Other | Source: Ambulatory Visit | Attending: Oncology | Admitting: Oncology

## 2017-12-12 ENCOUNTER — Telehealth: Payer: Self-pay | Admitting: Emergency Medicine

## 2017-12-12 ENCOUNTER — Encounter: Payer: Self-pay | Admitting: Radiology

## 2017-12-12 DIAGNOSIS — C7B02 Secondary carcinoid tumors of liver: Secondary | ICD-10-CM | POA: Diagnosis not present

## 2017-12-12 DIAGNOSIS — C7A8 Other malignant neuroendocrine tumors: Secondary | ICD-10-CM

## 2017-12-12 DIAGNOSIS — C7B8 Other secondary neuroendocrine tumors: Principal | ICD-10-CM

## 2017-12-12 HISTORY — PX: IR RADIOLOGIST EVAL & MGMT: IMG5224

## 2017-12-12 NOTE — Telephone Encounter (Signed)
Post ED Visit - Positive Culture Follow-up  Culture report reviewed by antimicrobial stewardship pharmacist:  []  Elenor Quinones, Pharm.D. []  Heide Guile, Pharm.D., BCPS AQ-ID []  Parks Neptune, Pharm.D., BCPS []  Alycia Rossetti, Pharm.D., BCPS []  [place], [place].D., BCPS, AAHIVP []  Legrand Como, Pharm.D., BCPS, AAHIVP []  Salome Arnt, PharmD, BCPS []  Jalene Mullet, PharmD []  Vincenza Hews, PharmD, BCPS Jimmy Footman PharmD  Positive urine culture Treated with fluconazole, organism sensitive to the same and no further patient follow-up is required at this time.  Hazle Nordmann 12/12/2017, 2:00 PM

## 2017-12-12 NOTE — Progress Notes (Signed)
ED Antimicrobial Stewardship Positive Culture Follow Up   Richard Navarro is an 72 y.o. male who presented to [facility] on 12/10/2017 with a chief complaint of  Chief Complaint  Patient presents with  . Sore Throat  . Fever    Recent Results (from the past 720 hour(s))  Blood culture (routine x 2)     Status: None (Preliminary result)   Collection Time: 12/10/17 11:02 AM  Result Value Ref Range Status   Specimen Description   Final    BLOOD LEFT ANTECUBITAL Performed at [facility] [address] [place], [place] [address]    Special Requests   Final    BOTTLES DRAWN AEROBIC AND ANAEROBIC Blood Culture results may not be optimal due to an excessive volume of blood received in culture bottles Performed at [facility] [address], [place], [place] [address]    Culture   Final    NO GROWTH < 24 HOURS Performed at [facility] [address] [place], [place] [address]    Report Status PENDING  Incomplete  Urine culture     Status: Abnormal   Collection Time: 12/10/17 11:02 AM  Result Value Ref Range Status   Specimen Description   Final    URINE, RANDOM Performed at [facility] [address] [place], [place] [address]    Special Requests   Final    NONE Performed at [facility], [place] [address], [place], [place] [address]    Culture (A)  Final    20,000 COLONIES/mL GROUP B STREP(S.AGALACTIAE)ISOLATED TESTING AGAINST S. AGALACTIAE NOT ROUTINELY PERFORMED DUE TO PREDICTABILITY OF AMP/PEN/VAN SUSCEPTIBILITY. Performed at [facility], [place] [address], [place], [place] [address]    Report Status 12/11/2017 FINAL  Final  Blood culture (routine x 2)     Status: None (Preliminary result)   Collection Time: 12/10/17 11:55 AM  Result Value Ref Range Status   Specimen Description   Final    BLOOD RIGHT ANTECUBITAL Performed at [facility] [address]  [place], [place] [address]    Special Requests   Final    BOTTLES DRAWN AEROBIC AND ANAEROBIC Blood Culture adequate volume Performed at [facility] [address], [place],  [address]    Culture   Final    NO GROWTH < 24 HOURS Performed at [facility] [address], [place],  [address]    Report Status PENDING  Incomplete   Pt treated for oropharyngeal candidiasis with fluconazole. No urinary symptoms reported.  Plan to finish fluconazole course and for no further antibiotic therapy.   ED Provider: Fransico Meadow PA-C   Karmen Stabs, PharmD Candidate 12/12/2017, 9:35 AM Phone# [phone]
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
## 2017-12-13 ENCOUNTER — Ambulatory Visit (INDEPENDENT_AMBULATORY_CARE_PROVIDER_SITE_OTHER): Payer: Medicare Other | Admitting: *Deleted

## 2017-12-13 ENCOUNTER — Encounter: Payer: Self-pay | Admitting: *Deleted

## 2017-12-13 VITALS — BP 120/60 | HR 77 | Temp 98.7°F | Ht 75.0 in | Wt 181.0 lb

## 2017-12-13 DIAGNOSIS — Z0000 Encounter for general adult medical examination without abnormal findings: Secondary | ICD-10-CM

## 2017-12-13 NOTE — Progress Notes (Signed)
I have reviewed and agree with note, evaluation, plan.  We will follow-up together if nosebleeds do not improve.  [name], MD
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
## 2017-12-13 NOTE — Patient Instructions (Addendum)
Mr. Rabenold , Thank you for taking time to come for your Medicare Wellness Visit. I appreciate your ongoing commitment to your health goals. Please review the following plan we discussed and let me know if I can assist you in the future.   For your nose bleed Continue treatment and follow these intructions Humidifier in the bedroom  Hold pressure Nasal saline spray Make an apt with Dr. Yong Channel if this continues over the next week     These are the goals we discussed:  Goals    . Patient Stated     Reduce your stress;        This is a list of the screening recommended for you and due dates:  Health Maintenance  Topic Date Due  . Eye exam for diabetics  01/17/2018  . Flu Shot  03/21/2018  . Hemoglobin A1C  05/28/2018  . Complete foot exam   11/27/2018  . Colon Cancer Screening  05/25/2019  . Tetanus Vaccine  03/26/2021  .  Hepatitis C: One time screening is recommended by Center for Disease Control  (CDC) for  adults born from [birth year] through 1967.   Completed  . Pneumonia vaccines  Completed    Health Maintenance, Male A healthy lifestyle and preventive care is important for your health and wellness. Ask your health care provider about what schedule of regular examinations is right for you. What should I know about weight and diet? Eat a Healthy Diet  Eat plenty of vegetables, fruits, whole grains, low-fat dairy products, and lean protein.  Do not eat a lot of foods high in solid fats, added sugars, or salt.  Maintain a Healthy Weight Regular exercise can help you achieve or maintain a healthy weight. You should:  Do at least 150 minutes of exercise each week. The exercise should increase your heart rate and make you sweat (moderate-intensity exercise).  Do strength-training exercises at least twice a week.  Watch Your Levels of Cholesterol and Blood Lipids  Have your blood tested for lipids and cholesterol every 5 years starting at 74 years of age. If you are at high  risk for heart disease, you should start having your blood tested when you are 75 years old. You may need to have your cholesterol levels checked more often if: ? Your lipid or cholesterol levels are high. ? You are older than 74 years of age. ? You are at high risk for heart disease.  What should I know about cancer screening? Many types of cancers can be detected early and may often be prevented. Lung Cancer  You should be screened every year for lung cancer if: ? You are a current smoker who has smoked for at least 30 years. ? You are a former smoker who has quit within the past 15 years.  Talk to your health care provider about your screening options, when you should start screening, and how often you should be screened.  Colorectal Cancer  Routine colorectal cancer screening usually begins at 74 years of age and should be repeated every 5-10 years until you are 74 years old. You may need to be screened more often if early forms of precancerous polyps or small growths are found. Your health care provider may recommend screening at an earlier age if you have risk factors for colon cancer.  Your health care provider may recommend using home test kits to check for hidden blood in the stool.  A small camera at the end of a tube can  be used to examine your colon (sigmoidoscopy or colonoscopy). This checks for the earliest forms of colorectal cancer.  Prostate and Testicular Cancer  Depending on your age and overall health, your health care provider may do certain tests to screen for prostate and testicular cancer.  Talk to your health care provider about any symptoms or concerns you have about testicular or prostate cancer.  Skin Cancer  Check your skin from head to toe regularly.  Tell your health care provider about any new moles or changes in moles, especially if: ? There is a change in a mole's size, shape, or color. ? You have a mole that is larger than a pencil  eraser.  Always use sunscreen. Apply sunscreen liberally and repeat throughout the day.  Protect yourself by wearing long sleeves, pants, a wide-brimmed hat, and sunglasses when outside.  What should I know about heart disease, diabetes, and high blood pressure?  If you are 54-63 years of age, have your blood pressure checked every 3-5 years. If you are 75 years of age or older, have your blood pressure checked every year. You should have your blood pressure measured twice-once when you are at a hospital or clinic, and once when you are not at a hospital or clinic. Record the average of the two measurements. To check your blood pressure when you are not at a hospital or clinic, you can use: ? An automated blood pressure machine at a pharmacy. ? A home blood pressure monitor.  Talk to your health care provider about your target blood pressure.  If you are between 79-69 years old, ask your health care provider if you should take aspirin to prevent heart disease.  Have regular diabetes screenings by checking your fasting blood sugar level. ? If you are at a normal weight and have a low risk for diabetes, have this test once every three years after the age of 58. ? If you are overweight and have a high risk for diabetes, consider being tested at a younger age or more often.  A one-time screening for abdominal aortic aneurysm (AAA) by ultrasound is recommended for men aged 81-75 years who are current or former smokers. What should I know about preventing infection? Hepatitis B If you have a higher risk for hepatitis B, you should be screened for this virus. Talk with your health care provider to find out if you are at risk for hepatitis B infection. Hepatitis C Blood testing is recommended for:  Everyone born from [birth year] through 1967.  Anyone with known risk factors for hepatitis C.  Sexually Transmitted Diseases (STDs)  You should be screened each year for STDs including gonorrhea and  chlamydia if: ? You are sexually active and are younger than 74 years of age. ? You are older than 74 years of age and your health care provider tells you that you are at risk for this type of infection. ? Your sexual activity has changed since you were last screened and you are at an increased risk for chlamydia or gonorrhea. Ask your health care provider if you are at risk.  Talk with your health care provider about whether you are at high risk of being infected with HIV. Your health care provider may recommend a prescription medicine to help prevent HIV infection.  What else can I do?  Schedule regular health, dental, and eye exams.  Stay current with your vaccines (immunizations).  Do not use any tobacco products, such as cigarettes, chewing tobacco, and e-cigarettes.  If you need help quitting, ask your health care provider.  Limit alcohol intake to no more than 2 drinks per day. One drink equals 12 ounces of beer, 5 ounces of wine, or 1 ounces of hard liquor.  Do not use street drugs.  Do not share needles.  Ask your health care provider for help if you need support or information about quitting drugs.  Tell your health care provider if you often feel depressed.  Tell your health care provider if you have ever been abused or do not feel safe at home. This information is not intended to replace advice given to you by your health care provider. Make sure you discuss any questions you have with your health care provider. Document Released: 02/03/2008 Document Revised: 04/05/2016 Document Reviewed: 05/11/2015 Elsevier Interactive Patient Education  2018 [place] Prevention in the [place] can cause injuries and can affect people from all age groups. There are many simple things that you can do to make your home safe and to help prevent falls. What can I do on the outside of my home?  Regularly repair the edges of walkways and driveways and fix any  cracks.  Remove high doorway thresholds.  Trim any shrubbery on the main path into your home.  Use bright outdoor lighting.  Clear walkways of debris and clutter, including tools and rocks.  Regularly check that handrails are securely fastened and in good repair. Both sides of any steps should have handrails.  Install guardrails along the edges of any raised decks or porches.  Have leaves, snow, and ice cleared regularly.  Use sand or salt on walkways during winter months.  In the garage, clean up any spills right away, including grease or oil spills. What can I do in the bathroom?  Use night lights.  Install grab bars by the toilet and in the tub and shower. Do not use towel bars as grab bars.  Use non-skid mats or decals on the floor of the tub or shower.  If you need to sit down while you are in the shower, use a plastic, non-slip stool.  Keep the floor dry. Immediately clean up any water that spills on the floor.  Remove soap buildup in the tub or shower on a regular basis.  Attach bath mats securely with double-sided non-slip rug tape.  Remove throw rugs and other tripping hazards from the floor. What can I do in the bedroom?  Use night lights.  Make sure that a bedside light is easy to reach.  Do not use oversized bedding that drapes onto the floor.  Have a firm chair that has side arms to use for getting dressed.  Remove throw rugs and other tripping hazards from the floor. What can I do in the kitchen?  Clean up any spills right away.  Avoid walking on wet floors.  Place frequently used items in easy-to-reach places.  If you need to reach for something above you, use a sturdy step stool that has a grab bar.  Keep electrical cables out of the way.  Do not use floor polish or wax that makes floors slippery. If you have to use wax, make sure that it is non-skid floor wax.  Remove throw rugs and other tripping hazards from the floor. What can I do in  the stairways?  Do not leave any items on the stairs.  Make sure that there are handrails on both sides of the stairs. Fix handrails that are  broken or loose. Make sure that handrails are as long as the stairways.  Check any carpeting to make sure that it is firmly attached to the stairs. Fix any carpet that is loose or worn.  Avoid having throw rugs at the top or bottom of stairways, or secure the rugs with carpet tape to prevent them from moving.  Make sure that you have a light switch at the top of the stairs and the bottom of the stairs. If you do not have them, have them installed. What are some other fall prevention tips?  Wear closed-toe shoes that fit well and support your feet. Wear shoes that have rubber soles or low heels.  When you use a stepladder, make sure that it is completely opened and that the sides are firmly locked. Have someone hold the ladder while you are using it. Do not climb a closed stepladder.  Add color or contrast paint or tape to grab bars and handrails in your home. Place contrasting color strips on the first and last steps.  Use mobility aids as needed, such as canes, walkers, scooters, and crutches.  Turn on lights if it is dark. Replace any light bulbs that burn out.  Set up furniture so that there are clear paths. Keep the furniture in the same spot.  Fix any uneven floor surfaces.  Choose a carpet design that does not hide the edge of steps of a stairway.  Be aware of any and all pets.  Review your medicines with your healthcare provider. Some medicines can cause dizziness or changes in blood pressure, which increase your risk of falling. Talk with your health care provider about other ways that you can decrease your risk of falls. This may include working with a physical therapist or trainer to improve your strength, balance, and endurance. This information is not intended to replace advice given to you by your health care provider. Make sure  you discuss any questions you have with your health care provider. Document Released: 07/28/2002 Document Revised: 01/04/2016 Document Reviewed: 09/11/2014 Elsevier Interactive Patient Education  [employer].

## 2017-12-14 ENCOUNTER — Ambulatory Visit (HOSPITAL_COMMUNITY)
Admission: RE | Admit: 2017-12-14 | Discharge: 2017-12-14 | Disposition: A | Discharge status: Home / Self Care | Payer: Medicare Other | Source: Ambulatory Visit | Attending: Interventional Radiology | Admitting: Interventional Radiology

## 2017-12-14 DIAGNOSIS — R911 Solitary pulmonary nodule: Secondary | ICD-10-CM | POA: Diagnosis not present

## 2017-12-14 DIAGNOSIS — C3490 Malignant neoplasm of unspecified part of unspecified bronchus or lung: Secondary | ICD-10-CM | POA: Diagnosis not present

## 2017-12-14 DIAGNOSIS — K769 Liver disease, unspecified: Secondary | ICD-10-CM | POA: Diagnosis not present

## 2017-12-14 DIAGNOSIS — C787 Secondary malignant neoplasm of liver and intrahepatic bile duct: Secondary | ICD-10-CM | POA: Diagnosis not present

## 2017-12-14 DIAGNOSIS — C7A8 Other malignant neuroendocrine tumors: Secondary | ICD-10-CM | POA: Diagnosis not present

## 2017-12-14 DIAGNOSIS — C7B8 Other secondary neuroendocrine tumors: Secondary | ICD-10-CM | POA: Diagnosis not present

## 2017-12-14 MED ORDER — GADOBENATE DIMEGLUMINE 529 MG/ML IV SOLN
20.0000 mL | Freq: Once | INTRAVENOUS | Status: AC | PRN
  Administered 2017-12-14: 17 mL via INTRAVENOUS

## 2017-12-15 LAB — CULTURE, BLOOD (ROUTINE X 2)
Culture: NO GROWTH
Culture: NO GROWTH
Special Requests: ADEQUATE

## 2017-12-27 ENCOUNTER — Encounter: Payer: Self-pay | Admitting: Family Medicine

## 2017-12-27 ENCOUNTER — Ambulatory Visit (INDEPENDENT_AMBULATORY_CARE_PROVIDER_SITE_OTHER): Payer: Medicare Other | Admitting: Family Medicine

## 2017-12-27 VITALS — BP 124/72 | HR 93 | Temp 98.6°F | Ht 75.0 in | Wt 176.4 lb

## 2017-12-27 DIAGNOSIS — J3489 Other specified disorders of nose and nasal sinuses: Secondary | ICD-10-CM | POA: Diagnosis not present

## 2017-12-27 DIAGNOSIS — J029 Acute pharyngitis, unspecified: Secondary | ICD-10-CM | POA: Diagnosis not present

## 2017-12-27 NOTE — Patient Instructions (Addendum)
Throat looks like there is some drainage plus with the runny nose- I think allergies are at play here.   Lets have you try zyrtec or Claritin or allegra or xyzal once a night for 2-3 weeks to see if that helps.   If symptoms do not improve within this time frame or if they worsen, or you get fever, chills, see [place] back immediately.

## 2017-12-27 NOTE — Progress Notes (Signed)
Subjective:  Richard Navarro is a 72 y.o. year old very pleasant male patient who presents for/with See problem oriented charting ROS-denies fever or chills.  Complains of sore throat and runny nose.  No recent increase in shortness of breath.  Past Medical History-  Patient Active Problem List   Diagnosis Date Noted  . 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 Primary cancer of right lung ([place]) 12/22/2016    Priority: High  . Diabetes mellitus without complication ([place])     Priority: High  . COPD (chronic obstructive pulmonary disease) ([place]) 12/01/2016    Priority: Medium  . PTSD (post-traumatic stress disorder) 12/07/2015    Priority: Medium  . Erectile dysfunction 04/09/2014    Priority: Medium  . Tobacco abuse 03/26/2014    Priority: Medium  . Hypertension     Priority: Medium  . Gout     Priority: Medium  . Depression     Priority: Medium  . Hyperlipidemia     Priority: Medium  . Encounter for antineoplastic chemotherapy 01/10/2017    Priority: Low  . Goals of care, counseling/discussion 01/10/2017    Priority: Low  . CKD (chronic kidney disease), stage II 04/17/2014    Priority: Low  . Prostate cancer screening 04/17/2014    Priority: Low  . Arthritis 03/26/2014    Priority: Low  . History of adenomatous polyp of colon 03/26/2014    Priority: Low  . GERD (gastroesophageal reflux disease)     Priority: Low  . Pneumonia 08/09/2017  . Tachycardia 08/09/2017  . Urinary frequency 04/27/2017    Medications- reviewed and updated Current Outpatient Medications  Medication Sig Dispense Refill  . AFINITOR 7.5 MG tablet Take 1 tablet (7.5 mg total) by mouth daily. 90 tablet 2  . albuterol (PROVENTIL HFA;VENTOLIN HFA) 108 (90 Base) MCG/ACT inhaler Inhale 2 puffs into the lungs every 4 (four) hours as needed for wheezing or shortness of breath. 1 Inhaler 0  . allopurinol (ZYLOPRIM) 100 MG tablet TAKE 1 TABLET DAILY 90 tablet 3  . amLODipine  (NORVASC) 5 MG tablet Take 1 tablet (5 mg total) daily by mouth. 90 tablet 3  . aspirin 81 MG tablet Take 1 tablet (81 mg total) by mouth daily. 90 tablet 3  . glimepiride (AMARYL) 2 MG tablet Take 1 tablet (2 mg total) daily before breakfast by mouth. (Patient taking differently: Take 1 mg by mouth daily before breakfast. ) 30 tablet 5  . glucose blood test strip Use to test blood sugar twice a day 200 each 3  . indomethacin (INDOCIN) 50 MG capsule Take 1 capsule 3 times a day with meals X1 week 21 capsule 0  . loperamide (IMODIUM A-D) 2 MG tablet Take 1 mg by mouth daily as needed for diarrhea or loose stools.    . magic mouthwash w/lidocaine SOLN Swish and swallow 5 cc by mouth every six hours for mouth sores. 240 mL 1  . metFORMIN (GLUCOPHAGE) 500 MG tablet Take 1 tablet (500 mg total) 2 (two) times daily with a meal by mouth. 180 tablet 3  . pantoprazole (PROTONIX) 40 MG tablet Take 1 tablet (40 mg total) daily by mouth. 90 tablet 3  . Tetrahydrozoline HCl (EYE DROPS OP) Place 1 drop into both eyes daily as needed (redness relief).    . vitamin B-12 (CYANOCOBALAMIN) 1000 MCG tablet Take 1,000 mcg by mouth daily.    . vitamin C (ASCORBIC ACID) 500 MG tablet Take 500 mg by mouth  daily.    . VOLTAREN 1 % GEL APPLY 2 GRAMS TOPICALLY FOUR TIMES A DAY AS NEEDED FOR LEFT SHOULDER ARTHRITIS 300 g 3   No current facility-administered medications for this visit.     Objective: BP 124/72 (BP Location: Left Arm, Patient Position: Sitting, Cuff Size: Normal)   Pulse 93   Temp 98.6 F (37 C) (Oral)   Ht 6\' 3"  (1.905 m)   Wt 176 lb 6.4 oz (80 kg)   SpO2 95%   BMI 22.05 kg/m  Gen: NAD, resting comfortably  Mild cobblestoning in the pharynx.  No white plaques in oropharynx.  Nasal turbinates erythematous with some edema and dried blood-also some clear drainage CV: RRR no murmurs rubs or gallops Lungs: CTAB no crackles, wheeze, rhonchi Abdomen: soft/nontender/nondistended/thin  Ext: no  edema Skin: warm, dry Neuro: Normal gait and speech  Assessment/Plan:  Sore throat, runny nose S: Patient remains on chemotherapy for neuroendocrine carcinoma of right lung.  He was seen in the emergency room on 12/10/2017.  He was diagnosed with oral pharyngeal candidiasis.  He was treated with Diflucan.  He had a CT at that time done to rule out pneumonia and pulmonary embolism.  Patient states he noted no improvement in his sore throat with the Diflucan.  He  has also tried to treat the sore throat with Magic mouthwash, salt water gargles.  He does admit in this timeframe he has dealt with sore throat particularly right behind the uvula.  He has not noted any ulcers in his mouth as he has had in the past.  He also admits to runny nose and nasal congestion.  He has had a few bloody noses.  He denies a history of seasonal allergies A/P: Given his lack of improvement on Diflucan and no signs of thrush today, suspect etiology outside of thrush causing sore throat.  I know he has not had allergies in the past but this certainly could be allergies.  He has some cobblestoning, edematous nares, rhinorrhea-he agrees to trial over-the-counter antihistamine such as Zyrtec or Allegra over the next 2 to 3 weeks.  He will let me know if he is not improving or if he has worsening symptoms.  No clear signs of bacterial infection  Future Appointments  Date Time Provider [place]  01/07/2018  2:30 PM [facility] LAB 5 [facility] None  01/07/2018  3:00 PM Maryanna Shape, NP [facility] None  01/09/2018 11:30 AM [facility] ROOM [facility] None  01/09/2018  1:30 PM [facility] 1 [facility] [place]  02/26/2018  2:15 PM Landis Martins, DPM [facility]  03/04/2018  3:30 PM Yong Channel, Brayton Mars, MD [facility]   Return precautions advised.  [name], MD

## 2018-01-07 ENCOUNTER — Inpatient Hospital Stay: Payer: Medicare Other | Attending: Internal Medicine | Admitting: Oncology

## 2018-01-07 ENCOUNTER — Inpatient Hospital Stay: Payer: Medicare Other

## 2018-01-07 ENCOUNTER — Encounter: Payer: Self-pay | Admitting: Oncology

## 2018-01-07 ENCOUNTER — Telehealth: Payer: Self-pay

## 2018-01-07 VITALS — BP 130/81 | HR 100 | Temp 98.4°F | Resp 17 | Ht 75.0 in | Wt 176.4 lb

## 2018-01-07 DIAGNOSIS — R5383 Other fatigue: Secondary | ICD-10-CM | POA: Insufficient documentation

## 2018-01-07 DIAGNOSIS — C7A8 Other malignant neuroendocrine tumors: Secondary | ICD-10-CM | POA: Insufficient documentation

## 2018-01-07 DIAGNOSIS — Z5111 Encounter for antineoplastic chemotherapy: Secondary | ICD-10-CM

## 2018-01-07 DIAGNOSIS — C7B8 Other secondary neuroendocrine tumors: Secondary | ICD-10-CM | POA: Insufficient documentation

## 2018-01-07 DIAGNOSIS — R609 Edema, unspecified: Secondary | ICD-10-CM | POA: Diagnosis not present

## 2018-01-07 DIAGNOSIS — C3491 Malignant neoplasm of unspecified part of right bronchus or lung: Secondary | ICD-10-CM

## 2018-01-07 DIAGNOSIS — R070 Pain in throat: Secondary | ICD-10-CM | POA: Diagnosis not present

## 2018-01-07 LAB — CBC WITH DIFFERENTIAL (CANCER CENTER ONLY)
BASOS ABS: 0 10*3/uL (ref 0.0–0.1)
BASOS PCT: 1 %
EOS ABS: 0 10*3/uL (ref 0.0–0.5)
EOS PCT: 0 %
HCT: 32.4 % — ABNORMAL LOW (ref 38.4–49.9)
Hemoglobin: 10.8 g/dL — ABNORMAL LOW (ref 13.0–17.1)
Lymphocytes Relative: 31 %
Lymphs Abs: 1.2 10*3/uL (ref 0.9–3.3)
MCH: 25.9 pg — ABNORMAL LOW (ref 27.2–33.4)
MCHC: 33.3 g/dL (ref 32.0–36.0)
MCV: 77.9 fL — ABNORMAL LOW (ref 79.3–98.0)
MONO ABS: 0.4 10*3/uL (ref 0.1–0.9)
MONOS PCT: 11 %
Neutro Abs: 2.2 10*3/uL (ref 1.5–6.5)
Neutrophils Relative %: 57 %
PLATELETS: 144 10*3/uL (ref 140–400)
RBC: 4.16 MIL/uL — ABNORMAL LOW (ref 4.20–5.82)
RDW: 15.1 % — AB (ref 11.0–14.6)
WBC Count: 3.8 10*3/uL — ABNORMAL LOW (ref 4.0–10.3)

## 2018-01-07 LAB — CMP (CANCER CENTER ONLY)
ALBUMIN: 3.5 g/dL (ref 3.5–5.0)
ALT: 25 U/L (ref 0–55)
AST: 24 U/L (ref 5–34)
Alkaline Phosphatase: 86 U/L (ref 40–150)
Anion gap: 7 (ref 3–11)
BILIRUBIN TOTAL: 0.4 mg/dL (ref 0.2–1.2)
BUN: 19 mg/dL (ref 7–26)
CO2: 25 mmol/L (ref 22–29)
CREATININE: 1.79 mg/dL — AB (ref 0.70–1.30)
Calcium: 8.7 mg/dL (ref 8.4–10.4)
Chloride: 105 mmol/L (ref 98–109)
GFR, EST AFRICAN AMERICAN: 42 mL/min — AB (ref >60)
GFR, Estimated: 36 mL/min — ABNORMAL LOW (ref >60)
GLUCOSE: 185 mg/dL — AB (ref 70–140)
Potassium: 3.7 mmol/L (ref 3.5–5.1)
Sodium: 137 mmol/L (ref 136–145)
TOTAL PROTEIN: 7.2 g/dL (ref 6.4–8.3)

## 2018-01-07 MED ORDER — MAGIC MOUTHWASH W/LIDOCAINE: ORAL | 1 refills | Status: DC

## 2018-01-07 NOTE — Telephone Encounter (Signed)
Printed avs and calender of upcoming appointment . Per 5/20 los 

## 2018-01-07 NOTE — Patient Instructions (Signed)
Iron-Rich Diet Iron is a mineral that helps your body to produce hemoglobin. Hemoglobin is a protein in your red blood cells that carries oxygen to your body's tissues. Eating too little iron may cause you to feel weak and tired, and it can increase your risk for infection. Eating enough iron is necessary for your body's metabolism, muscle function, and nervous system. Iron is naturally found in many foods. It can also be added to foods or fortified in foods. There are two types of dietary iron:  Heme iron. Heme iron is absorbed by the body more easily than nonheme iron. Heme iron is found in meat, poultry, and fish.  Nonheme iron. Nonheme iron is found in dietary supplements, iron-fortified grains, beans, and vegetables.  You may need to follow an iron-rich diet if:  You have been diagnosed with iron deficiency or iron-deficiency anemia.  You have a condition that prevents you from absorbing dietary iron, such as: ? Infection in your intestines. ? Celiac disease. This involves long-lasting (chronic) inflammation of your intestines.  You do not eat enough iron.  You eat a diet that is high in foods that impair iron absorption.  You have lost a lot of blood.  You have heavy bleeding during your menstrual cycle.  You are pregnant.  What is my plan? Your health care provider may help you to determine how much iron you need per day based on your condition. Generally, when a person consumes sufficient amounts of iron in the diet, the following iron needs are met:  Men. ? 14-18 years old: 11 mg per day. ? 19-50 years old: 8 mg per day.  Women. ? 14-18 years old: 15 mg per day. ? 19-50 years old: 18 mg per day. ? Over 50 years old: 8 mg per day. ? Pregnant women: 27 mg per day. ? Breastfeeding women: 9 mg per day.  What do I need to know about an iron-rich diet?  Eat fresh fruits and vegetables that are high in vitamin C along with foods that are high in iron. This will help  increase the amount of iron that your body absorbs from food, especially with foods containing nonheme iron. Foods that are high in vitamin C include oranges, peppers, tomatoes, and mango.  Take iron supplements only as directed by your health care provider. Overdose of iron can be life-threatening. If you were prescribed iron supplements, take them with orange juice or a vitamin C supplement.  Cook foods in pots and pans that are made from iron.  Eat nonheme iron-containing foods alongside foods that are high in heme iron. This helps to improve your iron absorption.  Certain foods and drinks contain compounds that impair iron absorption. Avoid eating these foods in the same meal as iron-rich foods or with iron supplements. These include: ? Coffee, black tea, and red wine. ? Milk, dairy products, and foods that are high in calcium. ? Beans, soybeans, and peas. ? Whole grains.  When eating foods that contain both nonheme iron and compounds that impair iron absorption, follow these tips to absorb iron better. ? Soak beans overnight before cooking. ? Soak whole grains overnight and drain them before using. ? Ferment flours before baking, such as using yeast in bread dough. What foods can I eat? Grains Iron-fortified breakfast cereal. Iron-fortified whole-wheat bread. Enriched rice. Sprouted grains. Vegetables Spinach. Potatoes with skin. Green peas. Broccoli. Red and green bell peppers. Fermented vegetables. Fruits Prunes. Raisins. Oranges. Strawberries. Mango. Grapefruit. Meats and Other Protein Sources   Beef liver. Oysters. Beef. Shrimp. [place]. Chicken. [place]. Sardines. Chickpeas. Nuts. Tofu. Beverages Tomato juice. Fresh orange juice. Prune juice. Hibiscus tea. Fortified instant breakfast shakes. Condiments Tahini. Fermented soy sauce. Sweets and Desserts Black-strap molasses. Other Wheat germ. The items listed above may not be a complete list of recommended foods or beverages.  Contact your dietitian for more options. What foods are not recommended? Grains Whole grains. Bran cereal. Bran flour. Oats. Vegetables Artichokes. Brussels sprouts. Kale. Fruits Blueberries. Raspberries. Strawberries. Figs. Meats and Other Protein Sources Soybeans. Products made from soy protein. Dairy Milk. Cream. Cheese. Yogurt. Cottage cheese. Beverages Coffee. Black tea. Red wine. Sweets and Desserts Cocoa. Chocolate. Ice cream. Other Basil. Oregano. Parsley. The items listed above may not be a complete list of foods and beverages to avoid. Contact your dietitian for more information. This information is not intended to replace advice given to you by your health care provider. Make sure you discuss any questions you have with your health care provider. Document Released: 03/21/2005 Document Revised: 02/25/2016 Document Reviewed: 03/04/2014 Elsevier Interactive Patient Education  [employer].
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
## 2018-01-07 NOTE — Assessment & Plan Note (Signed)
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 The patient was started on treatment with Afinitor 10 mg by mouth daily status post 2 months of treatment. This was followed by reduction of his dose to 7.5 mg by mouth daily and he is tolerating this dose much better. The patienttolerated thistreatment fairly well. The patient wasoff treatment with Afinitor for about 1 month due to hospitalization for pneumonia and sepsis. He resumed his treatment19monthsago and is tolerating it well with the exception of mild fatigue, and trace lower extremity edema. Recommend that he continue treatment with Afinitor at the current dose. The patient is in agreement to continuing on his current treatment without any dose changes.  He will follow-up with interventional radiology later this week for embolization of his liver lesion.  He will follow-up in 1 month for evaluation and repeat lab work.  The patient was advised to call immediately if he has any concerning symptoms in the interval. The patient voices understanding of current disease status and treatment options and is in agreement with the current care plan. All questions were answered. The patient knows to call the clinic with any problems, questions or concerns. We can certainly see the patient much sooner if necessary.

## 2018-01-08 ENCOUNTER — Other Ambulatory Visit: Payer: Self-pay | Admitting: Radiology

## 2018-01-09 ENCOUNTER — Encounter (HOSPITAL_COMMUNITY): Payer: Self-pay

## 2018-01-09 ENCOUNTER — Other Ambulatory Visit: Payer: Self-pay | Admitting: Interventional Radiology

## 2018-01-09 ENCOUNTER — Ambulatory Visit (HOSPITAL_COMMUNITY)
Admission: RE | Admit: 2018-01-09 | Discharge: 2018-01-09 | Disposition: A | Discharge status: Home / Self Care | Payer: Medicare Other | Source: Ambulatory Visit | Attending: Interventional Radiology | Admitting: Interventional Radiology

## 2018-01-09 DIAGNOSIS — C7A8 Other malignant neuroendocrine tumors: Secondary | ICD-10-CM

## 2018-01-09 DIAGNOSIS — M109 Gout, unspecified: Secondary | ICD-10-CM | POA: Diagnosis not present

## 2018-01-09 DIAGNOSIS — J439 Emphysema, unspecified: Secondary | ICD-10-CM | POA: Diagnosis not present

## 2018-01-09 DIAGNOSIS — I129 Hypertensive chronic kidney disease with stage 1 through stage 4 chronic kidney disease, or unspecified chronic kidney disease: Secondary | ICD-10-CM | POA: Insufficient documentation

## 2018-01-09 DIAGNOSIS — F4310 Post-traumatic stress disorder, unspecified: Secondary | ICD-10-CM | POA: Diagnosis not present

## 2018-01-09 DIAGNOSIS — F329 Major depressive disorder, single episode, unspecified: Secondary | ICD-10-CM | POA: Diagnosis not present

## 2018-01-09 DIAGNOSIS — C7B02 Secondary carcinoid tumors of liver: Secondary | ICD-10-CM | POA: Diagnosis not present

## 2018-01-09 DIAGNOSIS — K219 Gastro-esophageal reflux disease without esophagitis: Secondary | ICD-10-CM | POA: Insufficient documentation

## 2018-01-09 DIAGNOSIS — N189 Chronic kidney disease, unspecified: Secondary | ICD-10-CM | POA: Insufficient documentation

## 2018-01-09 DIAGNOSIS — C7B8 Other secondary neuroendocrine tumors: Principal | ICD-10-CM

## 2018-01-09 DIAGNOSIS — M1990 Unspecified osteoarthritis, unspecified site: Secondary | ICD-10-CM | POA: Insufficient documentation

## 2018-01-09 DIAGNOSIS — Z85110 Personal history of malignant carcinoid tumor of bronchus and lung: Secondary | ICD-10-CM | POA: Insufficient documentation

## 2018-01-09 DIAGNOSIS — E1122 Type 2 diabetes mellitus with diabetic chronic kidney disease: Secondary | ICD-10-CM | POA: Insufficient documentation

## 2018-01-09 DIAGNOSIS — Z7984 Long term (current) use of oral hypoglycemic drugs: Secondary | ICD-10-CM | POA: Diagnosis not present

## 2018-01-09 DIAGNOSIS — E785 Hyperlipidemia, unspecified: Secondary | ICD-10-CM | POA: Diagnosis not present

## 2018-01-09 DIAGNOSIS — C787 Secondary malignant neoplasm of liver and intrahepatic bile duct: Secondary | ICD-10-CM | POA: Diagnosis not present

## 2018-01-09 DIAGNOSIS — Z7982 Long term (current) use of aspirin: Secondary | ICD-10-CM | POA: Diagnosis not present

## 2018-01-09 HISTORY — PX: IR ANGIOGRAM SELECTIVE EACH ADDITIONAL VESSEL: IMG667

## 2018-01-09 HISTORY — PX: IR ANGIOGRAM EXTREMITY LEFT: IMG651

## 2018-01-09 HISTORY — PX: IR ANGIOGRAM VISCERAL SELECTIVE: IMG657

## 2018-01-09 HISTORY — PX: IR US GUIDE VASC ACCESS LEFT: IMG2389

## 2018-01-09 HISTORY — PX: IR EMBO TUMOR ORGAN ISCHEMIA INFARCT INC GUIDE ROADMAPPING: IMG5449

## 2018-01-09 LAB — GLUCOSE, CAPILLARY: Glucose-Capillary: 183 mg/dL — ABNORMAL HIGH (ref 65–99)

## 2018-01-09 LAB — CBC WITH DIFFERENTIAL/PLATELET
Basophils Absolute: 0 10*3/uL (ref 0.0–0.1)
Basophils Relative: 0 %
EOS ABS: 0.1 10*3/uL (ref 0.0–0.7)
Eosinophils Relative: 1 %
HEMATOCRIT: 35.3 % — AB (ref 39.0–52.0)
HEMOGLOBIN: 11.6 g/dL — AB (ref 13.0–17.0)
LYMPHS ABS: 1.4 10*3/uL (ref 0.7–4.0)
Lymphocytes Relative: 33 %
MCH: 26.1 pg (ref 26.0–34.0)
MCHC: 32.9 g/dL (ref 30.0–36.0)
MCV: 79.3 fL (ref 78.0–100.0)
MONO ABS: 0.3 10*3/uL (ref 0.1–1.0)
MONOS PCT: 6 %
NEUTROS PCT: 60 %
Neutro Abs: 2.6 10*3/uL (ref 1.7–7.7)
Platelets: 150 10*3/uL (ref 150–400)
RBC: 4.45 MIL/uL (ref 4.22–5.81)
RDW: 14.5 % (ref 11.5–15.5)
WBC: 4.4 10*3/uL (ref 4.0–10.5)

## 2018-01-09 LAB — COMPREHENSIVE METABOLIC PANEL
ALK PHOS: 83 U/L (ref 38–126)
ALT: 25 U/L (ref 17–63)
AST: 29 U/L (ref 15–41)
Albumin: 3.6 g/dL (ref 3.5–5.0)
Anion gap: 11 (ref 5–15)
BUN: 24 mg/dL — ABNORMAL HIGH (ref 6–20)
CALCIUM: 8.6 mg/dL — AB (ref 8.9–10.3)
CO2: 21 mmol/L — AB (ref 22–32)
Chloride: 105 mmol/L (ref 101–111)
Creatinine, Ser: 1.51 mg/dL — ABNORMAL HIGH (ref 0.61–1.24)
GFR calc Af Amer: 51 mL/min — ABNORMAL LOW (ref >60)
GFR calc non Af Amer: 44 mL/min — ABNORMAL LOW (ref >60)
GLUCOSE: 171 mg/dL — AB (ref 65–99)
Potassium: 3.8 mmol/L (ref 3.5–5.1)
SODIUM: 137 mmol/L (ref 135–145)
Total Bilirubin: 0.7 mg/dL (ref 0.3–1.2)
Total Protein: 7.7 g/dL (ref 6.5–8.1)

## 2018-01-09 LAB — PROTIME-INR
INR: 0.84
Prothrombin Time: 11.4 seconds (ref 11.4–15.2)

## 2018-01-09 MED ORDER — FENTANYL CITRATE (PF) 100 MCG/2ML IJ SOLN
INTRAMUSCULAR | Status: AC
  Filled 2018-01-09: qty 2

## 2018-01-09 MED ORDER — FAMOTIDINE IN NACL 20-0.9 MG/50ML-% IV SOLN
20.0000 mg | Freq: Once | INTRAVENOUS | Status: AC
  Administered 2018-01-09: 20 mg via INTRAVENOUS
  Filled 2018-01-09: qty 50

## 2018-01-09 MED ORDER — SODIUM CHLORIDE 0.9 % IV SOLN
INTRAVENOUS | Status: DC
  Administered 2018-01-09: 12:00:00 via INTRAVENOUS

## 2018-01-09 MED ORDER — NITROGLYCERIN IN D5W 100-5 MCG/ML-% IV SOLN
INTRAVENOUS | Status: AC
  Filled 2018-01-09: qty 250

## 2018-01-09 MED ORDER — SODIUM CHLORIDE 0.9 % IV SOLN
8.0000 mg | Freq: Once | INTRAVENOUS | Status: AC
  Administered 2018-01-09: 8 mg via INTRAVENOUS
  Filled 2018-01-09: qty 4

## 2018-01-09 MED ORDER — VERAPAMIL HCL 2.5 MG/ML IV SOLN
INTRA_ARTERIAL | Status: AC | PRN
  Administered 2018-01-09: 15:00:00 via INTRA_ARTERIAL

## 2018-01-09 MED ORDER — IOPAMIDOL (ISOVUE-300) INJECTION 61%
INTRAVENOUS | Status: AC
  Administered 2018-01-09: 45 mL via INTRA_ARTERIAL
  Filled 2018-01-09: qty 200

## 2018-01-09 MED ORDER — SODIUM CHLORIDE 0.9 % IV SOLN: INTRAVENOUS | Status: DC

## 2018-01-09 MED ORDER — HEPARIN SODIUM (PORCINE) 1000 UNIT/ML IJ SOLN
INTRAMUSCULAR | Status: AC
  Filled 2018-01-09: qty 1

## 2018-01-09 MED ORDER — MIDAZOLAM HCL 2 MG/2ML IJ SOLN
INTRAMUSCULAR | Status: AC
  Filled 2018-01-09: qty 4

## 2018-01-09 MED ORDER — FENTANYL CITRATE (PF) 100 MCG/2ML IJ SOLN
INTRAMUSCULAR | Status: AC | PRN
  Administered 2018-01-09 (×2): 50 ug via INTRAVENOUS

## 2018-01-09 MED ORDER — OCTREOTIDE ACETATE 500 MCG/ML IJ SOLN
500.0000 ug | Freq: Once | INTRAMUSCULAR | Status: DC | PRN
  Filled 2018-01-09: qty 1

## 2018-01-09 MED ORDER — VERAPAMIL HCL 2.5 MG/ML IV SOLN
INTRAVENOUS | Status: AC
  Filled 2018-01-09: qty 2

## 2018-01-09 MED ORDER — IODIXANOL 320 MG/ML IV SOLN
150.0000 mL | Freq: Once | INTRAVENOUS | Status: AC | PRN
Start: 2018-01-09 — End: 2018-01-09
  Administered 2018-01-09: 36 mL via INTRAVENOUS

## 2018-01-09 MED ORDER — PIPERACILLIN-TAZOBACTAM 3.375 G IVPB: 3.3750 g | Freq: Once | INTRAVENOUS | Status: DC

## 2018-01-09 MED ORDER — IOPAMIDOL (ISOVUE-300) INJECTION 61%
100.0000 mL | Freq: Once | INTRAVENOUS | Status: AC | PRN
  Administered 2018-01-09: 45 mL via INTRA_ARTERIAL

## 2018-01-09 MED ORDER — OCTREOTIDE ACETATE 500 MCG/ML IJ SOLN
500.0000 ug | Freq: Once | INTRAMUSCULAR | Status: AC
  Administered 2018-01-09: 500 ug via SUBCUTANEOUS
  Filled 2018-01-09: qty 5
  Filled 2018-01-09: qty 1
  Filled 2018-01-09: qty 5

## 2018-01-09 MED ORDER — LIDOCAINE HCL 1 % IJ SOLN
INTRAMUSCULAR | Status: AC
  Filled 2018-01-09: qty 20

## 2018-01-09 MED ORDER — PIPERACILLIN-TAZOBACTAM 3.375 G IVPB
INTRAVENOUS | Status: AC
  Administered 2018-01-09: 3.375 g
  Filled 2018-01-09: qty 50

## 2018-01-09 MED ORDER — LIDOCAINE HCL (PF) 1 % IJ SOLN
INTRAMUSCULAR | Status: AC | PRN
  Administered 2018-01-09: 5 mL

## 2018-01-09 MED ORDER — HYDRALAZINE HCL 20 MG/ML IJ SOLN
INTRAMUSCULAR | Status: AC
  Filled 2018-01-09: qty 1

## 2018-01-09 MED ORDER — LABETALOL HCL 5 MG/ML IV SOLN
20.0000 mg | Freq: Once | INTRAVENOUS | Status: DC | PRN
  Filled 2018-01-09: qty 4

## 2018-01-09 MED ORDER — MIDAZOLAM HCL 2 MG/2ML IJ SOLN
INTRAMUSCULAR | Status: AC | PRN
  Administered 2018-01-09 (×3): 1 mg via INTRAVENOUS

## 2018-01-09 MED ORDER — NITROGLYCERIN 5 MG/ML IV SOLN
INTRAVENOUS | Status: AC | PRN
  Administered 2018-01-09 (×2): 200 ug via INTRA_ARTERIAL

## 2018-01-09 MED ORDER — HYDRALAZINE HCL 20 MG/ML IJ SOLN: 20.0000 mg | Freq: Once | INTRAMUSCULAR | Status: DC | PRN

## 2018-01-09 NOTE — Sedation Documentation (Signed)
Patient snoring
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
## 2018-01-09 NOTE — Procedures (Signed)
Interventional Radiology Procedure Note  Procedure: Bland embolization of hepatic neuroendocrine met.   Vascular Access: Left radial, 67F  Complications: None  Estimated Blood Loss: None  Recommendations: - Rad Band deflation per protocol - DC home  Signed,  Criselda Peaches, MD

## 2018-01-09 NOTE — Discharge Instructions (Addendum)
Radial Site Care °Refer to this sheet in the next few weeks. These instructions provide you with information about caring for yourself after your procedure. Your health care provider may also give you more specific instructions. Your treatment has been planned according to current medical practices, but problems sometimes occur. Call your health care provider if you have any problems or questions after your procedure. °What can I expect after the procedure? °After your procedure, it is typical to have the following: °· Bruising at the radial site that usually fades within 1-2 weeks. °· Blood collecting in the tissue (hematoma) that may be painful to the touch. It should usually decrease in size and tenderness within 1-2 weeks. ° °Follow these instructions at home: °· Take medicines only as directed by your health care provider. °· You may shower 24-48 hours after the procedure or as directed by your health care provider. Remove the bandage (dressing) and gently wash the site with plain soap and water. Pat the area dry with a clean towel. Do not rub the site, because this may cause bleeding. °· Do not take baths, swim, or use a hot tub until your health care provider approves. °· Check your insertion site every day for redness, swelling, or drainage. °· Do not apply powder or lotion to the site. °· Do not flex or bend the affected arm for 24 hours or as directed by your health care provider. °· Do not push or pull heavy objects with the affected arm for 24 hours or as directed by your health care provider. °· Do not lift over 10 lb (4.5 kg) for 5 days after your procedure or as directed by your health care provider. °· Ask your health care provider when it is okay to: °? Return to work or school. °? Resume usual physical activities or sports. °? Resume sexual activity. °· Do not drive home if you are discharged the same day as the procedure. Have someone else drive you. °· You may drive 24 hours after the procedure  unless otherwise instructed by your health care provider. °· Do not operate machinery or power tools for 24 hours after the procedure. °· If your procedure was done as an outpatient procedure, which means that you went home the same day as your procedure, a responsible adult should be with you for the first 24 hours after you arrive home. °· Keep all follow-up visits as directed by your health care provider. This is important. °Contact a health care provider if: °· You have a fever. °· You have chills. °· You have increased bleeding from the radial site. Hold pressure on the site. °Get help right away if: °· You have unusual pain at the radial site. °· You have redness, warmth, or swelling at the radial site. °· You have drainage (other than a small amount of blood on the dressing) from the radial site. °· The radial site is bleeding, and the bleeding does not stop after 30 minutes of holding steady pressure on the site. °· Your arm or hand becomes pale, cool, tingly, or numb. °This information is not intended to replace advice given to you by your health care provider. Make sure you discuss any questions you have with your health care provider. °Document Released: 09/09/2010 Document Revised: 01/13/2016 Document Reviewed: 02/23/2014 °Elsevier Interactive Patient Education © 2018 Else[employer] Inc[employer] ° ° ° °Moderate Conscious Sedation, Adult, Care After °These instructions provide you with information about caring for yourself after your procedure. Your health care provider   may also give you more specific instructions. Your treatment has been planned according to current medical practices, but problems sometimes occur. Call your health care provider if you have any problems or questions after your procedure. °What can I expect after the procedure? °After your procedure, it is common: °· To feel sleepy for several hours. °· To feel clumsy and have poor balance for several hours. °· To have poor judgment for several  hours. °· To vomit if you eat too soon. ° °Follow these instructions at home: °For at least 24 hours after the procedure: ° °· Do not: °? Participate in activities where you could fall or become injured. °? Drive. °? Use heavy machinery. °? Drink alcohol. °? Take sleeping pills or medicines that cause drowsiness. °? Make important decisions or sign legal documents. °? Take care of children on your own. °· Rest. °Eating and drinking °· Follow the diet recommended by your health care provider. °· If you vomit: °? Drink water, juice, or soup when you can drink without vomiting. °? Make sure you have little or no nausea before eating solid foods. °General instructions °· Have a responsible adult stay with you until you are awake and alert. °· Take over-the-counter and prescription medicines only as told by your health care provider. °· If you smoke, do not smoke without supervision. °· Keep all follow-up visits as told by your health care provider. This is important. °Contact a health care provider if: °· You keep feeling nauseous or you keep vomiting. °· You feel light-headed. °· You develop a rash. °· You have a fever. °Get help right away if: °· You have trouble breathing. °This information is not intended to replace advice given to you by your health care provider. Make sure you discuss any questions you have with your health care provider. °Document Released: 05/28/2013 Document Revised: 01/10/2016 Document Reviewed: 11/27/2015 °Elsevier Interactive Patient Education © 2018 Elsevier Inc. ° °

## 2018-01-17 ENCOUNTER — Other Ambulatory Visit (HOSPITAL_COMMUNITY): Payer: Self-pay | Admitting: Interventional Radiology

## 2018-01-17 ENCOUNTER — Telehealth: Payer: Self-pay | Admitting: *Deleted

## 2018-01-17 ENCOUNTER — Other Ambulatory Visit: Payer: Self-pay | Admitting: Medical Oncology

## 2018-01-17 ENCOUNTER — Other Ambulatory Visit: Payer: Self-pay | Admitting: Radiology

## 2018-01-17 DIAGNOSIS — C7A8 Other malignant neuroendocrine tumors: Secondary | ICD-10-CM

## 2018-01-17 DIAGNOSIS — C7B8 Other secondary neuroendocrine tumors: Principal | ICD-10-CM

## 2018-01-17 DIAGNOSIS — C787 Secondary malignant neoplasm of liver and intrahepatic bile duct: Secondary | ICD-10-CM

## 2018-01-17 NOTE — Telephone Encounter (Signed)
Received call from Henry/Dr McCollough/[facility] asking for labs to be drawn at next appt for f/u of embolization of liver.  She will put in order for PT/INR & note placed on appt for lab to be added to go to Dr Geroge Baseman.  Note routed to Dr Mohammed/Pod RN

## 2018-01-21 ENCOUNTER — Telehealth: Payer: Self-pay | Admitting: Internal Medicine

## 2018-01-21 NOTE — Telephone Encounter (Signed)
Called pt, no answer, not able to leave message.  Resch from 6/18 to 7/9.  Mailed calendar.

## 2018-02-05 ENCOUNTER — Other Ambulatory Visit: Payer: Medicare Other

## 2018-02-05 ENCOUNTER — Ambulatory Visit: Payer: Medicare Other | Admitting: Internal Medicine

## 2018-02-11 DIAGNOSIS — E118 Type 2 diabetes mellitus with unspecified complications: Secondary | ICD-10-CM | POA: Diagnosis not present

## 2018-02-11 LAB — HM DIABETES EYE EXAM

## 2018-02-12 ENCOUNTER — Ambulatory Visit
Admission: RE | Admit: 2018-02-12 | Discharge: 2018-02-12 | Disposition: A | Discharge status: Home / Self Care | Payer: Medicare Other | Source: Ambulatory Visit | Attending: Interventional Radiology | Admitting: Interventional Radiology

## 2018-02-12 ENCOUNTER — Encounter: Payer: Self-pay | Admitting: *Deleted

## 2018-02-12 DIAGNOSIS — C7A1 Malignant poorly differentiated neuroendocrine tumors: Secondary | ICD-10-CM | POA: Diagnosis not present

## 2018-02-12 DIAGNOSIS — C787 Secondary malignant neoplasm of liver and intrahepatic bile duct: Secondary | ICD-10-CM | POA: Diagnosis not present

## 2018-02-12 DIAGNOSIS — C7B8 Other secondary neuroendocrine tumors: Principal | ICD-10-CM

## 2018-02-12 DIAGNOSIS — C7A8 Other malignant neuroendocrine tumors: Secondary | ICD-10-CM

## 2018-02-12 HISTORY — PX: IR RADIOLOGIST EVAL & MGMT: IMG5224
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
## 2018-02-18 ENCOUNTER — Other Ambulatory Visit: Payer: Self-pay | Admitting: Family Medicine

## 2018-02-20 ENCOUNTER — Encounter: Payer: Self-pay | Admitting: Family Medicine

## 2018-02-26 ENCOUNTER — Encounter: Payer: Self-pay | Admitting: Sports Medicine

## 2018-02-26 ENCOUNTER — Inpatient Hospital Stay: Payer: Medicare Other | Attending: Internal Medicine

## 2018-02-26 ENCOUNTER — Telehealth: Payer: Self-pay

## 2018-02-26 ENCOUNTER — Ambulatory Visit (INDEPENDENT_AMBULATORY_CARE_PROVIDER_SITE_OTHER): Payer: Medicare Other | Admitting: Sports Medicine

## 2018-02-26 ENCOUNTER — Inpatient Hospital Stay (HOSPITAL_BASED_OUTPATIENT_CLINIC_OR_DEPARTMENT_OTHER): Payer: Medicare Other | Admitting: Internal Medicine

## 2018-02-26 ENCOUNTER — Encounter: Payer: Self-pay | Admitting: Internal Medicine

## 2018-02-26 VITALS — BP 154/90 | HR 100 | Temp 98.2°F | Resp 18 | Ht 75.0 in | Wt 173.0 lb

## 2018-02-26 DIAGNOSIS — B351 Tinea unguium: Secondary | ICD-10-CM

## 2018-02-26 DIAGNOSIS — R0609 Other forms of dyspnea: Secondary | ICD-10-CM | POA: Diagnosis not present

## 2018-02-26 DIAGNOSIS — C7A8 Other malignant neuroendocrine tumors: Secondary | ICD-10-CM

## 2018-02-26 DIAGNOSIS — Z5111 Encounter for antineoplastic chemotherapy: Secondary | ICD-10-CM

## 2018-02-26 DIAGNOSIS — C7B8 Other secondary neuroendocrine tumors: Secondary | ICD-10-CM | POA: Insufficient documentation

## 2018-02-26 DIAGNOSIS — R5383 Other fatigue: Secondary | ICD-10-CM | POA: Insufficient documentation

## 2018-02-26 DIAGNOSIS — C3491 Malignant neoplasm of unspecified part of right bronchus or lung: Secondary | ICD-10-CM

## 2018-02-26 DIAGNOSIS — M79675 Pain in left toe(s): Secondary | ICD-10-CM

## 2018-02-26 DIAGNOSIS — M79674 Pain in right toe(s): Secondary | ICD-10-CM | POA: Diagnosis not present

## 2018-02-26 DIAGNOSIS — E119 Type 2 diabetes mellitus without complications: Secondary | ICD-10-CM

## 2018-02-26 DIAGNOSIS — I739 Peripheral vascular disease, unspecified: Secondary | ICD-10-CM

## 2018-02-26 LAB — CBC WITH DIFFERENTIAL (CANCER CENTER ONLY)
BASOS ABS: 0 10*3/uL (ref 0.0–0.1)
BASOS PCT: 0 %
EOS ABS: 0 10*3/uL (ref 0.0–0.5)
EOS PCT: 1 %
HEMATOCRIT: 27.6 % — AB (ref 38.4–49.9)
Hemoglobin: 9.1 g/dL — ABNORMAL LOW (ref 13.0–17.1)
Lymphocytes Relative: 36 %
Lymphs Abs: 1.5 10*3/uL (ref 0.9–3.3)
MCH: 25.8 pg — ABNORMAL LOW (ref 27.2–33.4)
MCHC: 33 g/dL (ref 32.0–36.0)
MCV: 78.2 fL — ABNORMAL LOW (ref 79.3–98.0)
MONO ABS: 0.5 10*3/uL (ref 0.1–0.9)
MONOS PCT: 11 %
Neutro Abs: 2.1 10*3/uL (ref 1.5–6.5)
Neutrophils Relative %: 52 %
PLATELETS: 185 10*3/uL (ref 140–400)
RBC: 3.53 MIL/uL — ABNORMAL LOW (ref 4.20–5.82)
RDW: 15.3 % — AB (ref 11.0–14.6)
WBC Count: 4.1 10*3/uL (ref 4.0–10.3)

## 2018-02-26 LAB — CMP (CANCER CENTER ONLY)
ALT: 21 U/L (ref 0–44)
AST: 22 U/L (ref 15–41)
Albumin: 3.6 g/dL (ref 3.5–5.0)
Alkaline Phosphatase: 90 U/L (ref 38–126)
Anion gap: 9 (ref 5–15)
BUN: 22 mg/dL (ref 8–23)
CALCIUM: 9.1 mg/dL (ref 8.9–10.3)
CO2: 25 mmol/L (ref 22–32)
CREATININE: 2.05 mg/dL — AB (ref 0.61–1.24)
Chloride: 105 mmol/L (ref 98–111)
GFR, EST AFRICAN AMERICAN: 35 mL/min — AB (ref >60)
GFR, EST NON AFRICAN AMERICAN: 30 mL/min — AB (ref >60)
GLUCOSE: 176 mg/dL — AB (ref 70–99)
POTASSIUM: 3.9 mmol/L (ref 3.5–5.1)
Sodium: 139 mmol/L (ref 135–145)
Total Bilirubin: 0.3 mg/dL (ref 0.3–1.2)
Total Protein: 7.6 g/dL (ref 6.5–8.1)
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
## 2018-02-26 NOTE — Progress Notes (Signed)
Subjective: Richard Navarro is a 72 y.o. male patient returns to office for Diabetic nail trim. Denies any acute problems or issues. Patient denies any changes with meds or health since last visit.   FBS 106 and, a1c 7.5.   Patient Active Problem List   Diagnosis Date Noted  . Pneumonia 08/09/2017  . Neuroendocrine carcinoma ([place]) 08/09/2017  . Tachycardia 08/09/2017  . Urinary frequency 04/27/2017  . Encounter for antineoplastic chemotherapy 01/10/2017  . Goals of care, counseling/discussion 01/10/2017  . Primary cancer of right lung ([place]) 12/22/2016  . COPD (chronic obstructive pulmonary disease) ([place]) 12/01/2016  . PTSD (post-traumatic stress disorder) 12/07/2015  . 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 Prostate cancer screening 04/17/2014  . Erectile dysfunction 04/09/2014  . Arthritis 03/26/2014  . History of adenomatous polyp of colon 03/26/2014  . Tobacco abuse 03/26/2014  . Diabetes mellitus without complication ([place])   . Hypertension   . Gout   . Depression   . GERD (gastroesophageal reflux disease)   . Hyperlipidemia     Current Outpatient Medications on File Prior to Visit  Medication Sig Dispense Refill  . AFINITOR 7.5 MG tablet Take 1 tablet (7.5 mg total) by mouth daily. 90 tablet 2  . albuterol (PROVENTIL HFA;VENTOLIN HFA) 108 (90 Base) MCG/ACT inhaler Inhale 2 puffs into the lungs every 4 (four) hours as needed for wheezing or shortness of breath. 1 Inhaler 0  . allopurinol (ZYLOPRIM) 100 MG tablet TAKE 1 TABLET DAILY 90 tablet 3  . amLODipine (NORVASC) 5 MG tablet Take 1 tablet (5 mg total) daily by mouth. 90 tablet 3  . aspirin 81 MG tablet Take 1 tablet (81 mg total) by mouth daily. 90 tablet 3  . glimepiride (AMARYL) 2 MG tablet TAKE 1 TABLET ONCE DAILY BEFORE BREAKFAST. 30 tablet 0  . glucose blood test strip Use to test blood sugar twice a day 200 each 3  . indomethacin (INDOCIN) 50 MG capsule Take 1 capsule 3 times a day with meals X1  week 21 capsule 0  . loperamide (IMODIUM A-D) 2 MG tablet Take 1 mg by mouth daily as needed for diarrhea or loose stools.    . magic mouthwash w/lidocaine SOLN Swish and swallow 5 cc by mouth every six hours for mouth sores. (Patient not taking: Reported on 02/26/2018) 240 mL 1  . metFORMIN (GLUCOPHAGE) 500 MG tablet Take 1 tablet (500 mg total) 2 (two) times daily with a meal by mouth. 180 tablet 3  . pantoprazole (PROTONIX) 40 MG tablet Take 1 tablet (40 mg total) daily by mouth. 90 tablet 3  . Tetrahydrozoline HCl (EYE DROPS OP) Place 1 drop into both eyes daily as needed (redness relief).    . vitamin B-12 (CYANOCOBALAMIN) 1000 MCG tablet Take 1,000 mcg by mouth daily.    . vitamin C (ASCORBIC ACID) 500 MG tablet Take 500 mg by mouth daily.    . VOLTAREN 1 % GEL APPLY 2 GRAMS TOPICALLY FOUR TIMES A DAY AS NEEDED FOR LEFT SHOULDER ARTHRITIS 300 g 3   No current facility-administered medications on file prior to visit.     Allergies  Allergen Reactions  . Lisinopril Swelling    Angioedema- on this and afinitor same time  . Simvastatin Other (See Comments)    Joint ache    Objective:  General: Alert and oriented x3 in no acute distress  Dermatology: No open lesions bilateral lower extremities, no webspace macerations, no ecchymosis bilateral, all nails x 10 are  mildly thickened and elongated.   Vascular: Dorsalis Pedis and Posterior Tibial pedal pulses 1/4, Capillary Fill Time 5 seconds,Scant pedal hair growth bilateral, +1 pitting edema bilateral lower extremities L=R, Temperature gradient within normal limits.  Neurology: Richard Navarro sensation intact via light touch bilateral.  Musculoskeletal: Semi-flexible hammertoes 2-5 with no tenderness with palpation at PIPJ Left 2nd toe. No other sympathic pedal deformities noted bilateral. No pain with calf compression bilateral.  Strength within normal limits in all groups bilateral.        Assessment and Plan: Problem List Items Addressed This  Visit      Endocrine   Diabetes mellitus without complication ([place])    Other Visit Diagnoses    Pain due to onychomycosis of toenails of both feet    -  Primary   PVD (peripheral vascular disease) ([place])         -Complete examination performed -Mechanically debrided nails x 10 using sterile nail nipper without incident  -Patient to return to office in 12 weeks for Diabetic nail care or sooner if condition worsens.  Landis Martins, DPM

## 2018-02-26 NOTE — Telephone Encounter (Signed)
PRINTED AVS AND CALENDER OF UPCOMING APPOINTMENT. PER 7/9 LOS

## 2018-03-04 ENCOUNTER — Ambulatory Visit (INDEPENDENT_AMBULATORY_CARE_PROVIDER_SITE_OTHER): Payer: Medicare Other | Admitting: Family Medicine

## 2018-03-04 ENCOUNTER — Encounter: Payer: Self-pay | Admitting: Family Medicine

## 2018-03-04 VITALS — BP 128/86 | HR 88 | Temp 98.0°F | Ht 75.0 in | Wt 175.0 lb

## 2018-03-04 DIAGNOSIS — I10 Essential (primary) hypertension: Secondary | ICD-10-CM | POA: Diagnosis not present

## 2018-03-04 DIAGNOSIS — N183 Chronic kidney disease, stage 3 unspecified: Secondary | ICD-10-CM

## 2018-03-04 DIAGNOSIS — T466X5A Adverse effect of antihyperlipidemic and antiarteriosclerotic drugs, initial encounter: Secondary | ICD-10-CM

## 2018-03-04 DIAGNOSIS — E119 Type 2 diabetes mellitus without complications: Secondary | ICD-10-CM | POA: Diagnosis not present

## 2018-03-04 DIAGNOSIS — M1A09X0 Idiopathic chronic gout, multiple sites, without tophus (tophi): Secondary | ICD-10-CM | POA: Diagnosis not present

## 2018-03-04 DIAGNOSIS — N1832 Chronic kidney disease, stage 3b: Secondary | ICD-10-CM | POA: Insufficient documentation

## 2018-03-04 DIAGNOSIS — M791 Myalgia, unspecified site: Secondary | ICD-10-CM

## 2018-03-04 DIAGNOSIS — D509 Iron deficiency anemia, unspecified: Secondary | ICD-10-CM

## 2018-03-04 LAB — POC URINALSYSI DIPSTICK (AUTOMATED)
BILIRUBIN UA: NEGATIVE
Blood, UA: NEGATIVE
GLUCOSE UA: NEGATIVE
KETONES UA: NEGATIVE
Leukocytes, UA: NEGATIVE
Nitrite, UA: NEGATIVE
Protein, UA: NEGATIVE
SPEC GRAV UA: 1.015 (ref 1.010–1.025)
Urobilinogen, UA: 0.2 E.U./dL
pH, UA: 5.5 (ref 5.0–8.0)

## 2018-03-04 LAB — POCT GLYCOSYLATED HEMOGLOBIN (HGB A1C): HEMOGLOBIN A1C: 7.8 % — AB (ref 4.0–5.6)

## 2018-03-04 MED ORDER — GLIMEPIRIDE 2 MG PO TABS: ORAL_TABLET | ORAL | 3 refills | Status: DC

## 2018-03-04 NOTE — Assessment & Plan Note (Signed)
S: controlled on amlodipine 5mg  BP Readings from Last 3 Encounters:  03/04/18 128/86  02/26/18 (!) 154/90  02/12/18 136/72  A/P: We discussed blood pressure goal of <140/90. Continue current meds

## 2018-03-04 NOTE — Patient Instructions (Addendum)
I would also like for you to sign up for an annual wellness visit with one of our nurses, on or after 12/15/2018, Cassie or Manuela Schwartz, who both specialize in the annual wellness visit. This is a free benefit under medicare that may help [place] find additional ways to help you. Some highlights are reviewing medications, lifestyle, and doing a dementia screen.  Please stop by the lab before you go.  Stop aspirin

## 2018-03-04 NOTE — Assessment & Plan Note (Signed)
S:  controlled on 2mg  amaryl in addition to metformin 500mg  BID.  CBGs- 100-153 in AM this month Lab Results  Component Value Date   HGBA1C 7.8 (A) 03/04/2018   HGBA1C 7.8 11/26/2017   HGBA1C 7.2 07/05/2017   A/P: remains reasonably controlled. a1c of 8 is a reasonable target. If GFR gets below 30 would need to stop metformin- update gfr/bmet today. Follow up 14 weeks

## 2018-03-04 NOTE — Assessment & Plan Note (Signed)
Only lovastatin once a week without myalgias- he is going to remain off statin given cancer treatments and myalgia symptoms

## 2018-03-04 NOTE — Progress Notes (Signed)
Subjective:  Richard Navarro is a 72 y.o. year old very pleasant male patient who presents for/with See problem oriented charting ROS- continued issues with fatigue particularly if out in heat for any amount of time, stable shortness of breath. No chest pain. No increased edema   Past Medical History-  Patient Active Problem List   Diagnosis Date Noted  . Neuroendocrine carcinoma ([place]) 08/09/2017    Priority: High  . Primary cancer of right lung ([place]) 12/22/2016    Priority: High  . Diabetes mellitus without complication ([place])     Priority: High  . COPD (chronic obstructive pulmonary disease) ([place]) 12/01/2016    Priority: Medium  . PTSD (post-traumatic stress disorder) 12/07/2015    Priority: Medium  . Erectile dysfunction 04/09/2014    Priority: Medium  . Former smoker 03/26/2014    Priority: Medium  . Hypertension     Priority: Medium  . Gout     Priority: Medium  . Depression     Priority: Medium  . Hyperlipidemia     Priority: Medium  . Encounter for antineoplastic chemotherapy 01/10/2017    Priority: Low  . Goals of care, counseling/discussion 01/10/2017    Priority: Low  . CKD (chronic kidney disease), stage II 04/17/2014    Priority: Low  . Prostate cancer screening 04/17/2014    Priority: Low  . Arthritis 03/26/2014    Priority: Low  . History of adenomatous polyp of colon 03/26/2014    Priority: Low  . GERD (gastroesophageal reflux disease)     Priority: Low  . Pneumonia 08/09/2017  . Tachycardia 08/09/2017  . Urinary frequency 04/27/2017    Medications- reviewed and updated Current Outpatient Medications  Medication Sig Dispense Refill  . AFINITOR 7.5 MG tablet Take 1 tablet (7.5 mg total) by mouth daily. 90 tablet 2  . albuterol (PROVENTIL HFA;VENTOLIN HFA) 108 (90 Base) MCG/ACT inhaler Inhale 2 puffs into the lungs every 4 (four) hours as needed for wheezing or shortness of breath. 1 Inhaler 0  . allopurinol (ZYLOPRIM) 100 MG tablet TAKE 1 TABLET  DAILY 90 tablet 3  . amLODipine (NORVASC) 5 MG tablet Take 1 tablet (5 mg total) daily by mouth. 90 tablet 3  . glimepiride (AMARYL) 2 MG tablet TAKE 1 TABLET ONCE DAILY BEFORE BREAKFAST. 90 tablet 3  . glucose blood test strip Use to test blood sugar twice a day 200 each 3  . loperamide (IMODIUM A-D) 2 MG tablet Take 1 mg by mouth daily as needed for diarrhea or loose stools.    . magic mouthwash w/lidocaine SOLN Swish and swallow 5 cc by mouth every six hours for mouth sores. (Patient not taking: Reported on 02/26/2018) 240 mL 1  . metFORMIN (GLUCOPHAGE) 500 MG tablet Take 1 tablet (500 mg total) 2 (two) times daily with a meal by mouth. 180 tablet 3  . pantoprazole (PROTONIX) 40 MG tablet Take 1 tablet (40 mg total) daily by mouth. 90 tablet 3  . Tetrahydrozoline HCl (EYE DROPS OP) Place 1 drop into both eyes daily as needed (redness relief).    . vitamin B-12 (CYANOCOBALAMIN) 1000 MCG tablet Take 1,000 mcg by mouth daily.    . vitamin C (ASCORBIC ACID) 500 MG tablet Take 500 mg by mouth daily.    . VOLTAREN 1 % GEL APPLY 2 GRAMS TOPICALLY FOUR TIMES A DAY AS NEEDED FOR LEFT SHOULDER ARTHRITIS 300 g 3   No current facility-administered medications for this visit.     Objective: BP 128/86 (BP  Location: Left Arm, Patient Position: Sitting, Cuff Size: Normal)   Pulse 88   Temp 98 F (36.7 C) (Oral)   Ht 6\' 3"  (1.905 m)   Wt 175 lb (79.4 kg)   SpO2 96%   BMI 21.87 kg/m  Gen: NAD, resting comfortably Nares with some dried blood- no active ulceration CV: RRR no murmurs rubs or gallops Lungs: CTAB no crackles, wheeze, rhonchi Abdomen: soft/nontender/nondistended/normal bowel sounds. No rebound or guarding.  Ext: 1+ edema stable Skin: warm, dry  Assessment/Plan:  Other notes: 1. MRI end of august planned- has had radio embolization in liver at end of august and he is hoping for good news  Fatigue S:Pushed himself on Saturday out in heat- feels a little run down since then.   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 Cramps in hands after working.  A/P: we discussed fatigue likely cancer related as well as treatment related. Encouraged him to take it relatively easy but still pursue things he enjoys.   He also had me check his nose worried about possible ulceration as oncology mentioned possible bactroban- has some dried blood but no clear ulceration so will monitor only. Can use saline nasal spray to keep passages moist  Microcytic anemia- patient may benefit from twice a week iron and if doesn't tolerate that potentially IV iron may be needed.   Diabetes mellitus without complication ([place]) S:  controlled on 2mg  amaryl in addition to metformin 500mg  BID.  CBGs- 100-153 in AM this month Lab Results  Component Value Date   HGBA1C 7.8 (A) 03/04/2018   HGBA1C 7.8 11/26/2017   HGBA1C 7.2 07/05/2017   A/P: remains reasonably controlled. a1c of 8 is a reasonable target. If GFR gets below 30 would need to stop metformin- update gfr/bmet today. Follow up 14 weeks  Hypertension S: controlled on amlodipine 5mg  BP Readings from Last 3 Encounters:  03/04/18 128/86  02/26/18 (!) 154/90  02/12/18 136/72  A/P: We discussed blood pressure goal of <140/90. Continue current meds   Future Appointments  Date Time Provider [place]  03/26/2018  2:30 PM [facility] 5 [facility] None  03/26/2018  3:00 PM Maryanna Shape, NP [facility] None  05/28/2018  3:15 PM Landis Martins, DPM [facility]  06/04/2018  3:30 PM Yong Channel Brayton Mars, MD [facility]   Return in about 3 months (around 06/10/2018) for follow up- or sooner if needed.  Lab/Order associations: Diabetes mellitus without complication ([place]) - Plan: POCT glycosylated hemoglobin (Hb A1C)  Microcytic anemia - Plan: CBC, Iron, TIBC and Ferritin Panel  Idiopathic chronic gout of  multiple sites without tophus - Plan: Uric acid  Essential hypertension - Plan: Basic metabolic panel, POCT Urinalysis Dipstick (Automated)  Meds ordered this encounter  Medications  . glimepiride (AMARYL) 2 MG tablet    Sig: TAKE 1 TABLET ONCE DAILY BEFORE BREAKFAST.    Dispense:  90 tablet    Refill:  3    Return precautions advised.  [name], MD

## 2018-03-04 NOTE — Assessment & Plan Note (Addendum)
S: CKD around 35 within last few weeks. Has ranged at least closer to 77 prior A/P: we will update GFR. Luckily BP controlled. Lipids slightly poorly but myalgias on statins so will remain off. Should avoid nsaids- need to reinforce that next visit. Will get UA as well- wife reports bubbles in urine- no large amounts of protein noted.

## 2018-03-05 LAB — CBC
HEMATOCRIT: 27.9 % — AB (ref 39.0–52.0)
Hemoglobin: 9.2 g/dL — ABNORMAL LOW (ref 13.0–17.0)
MCHC: 33 g/dL (ref 30.0–36.0)
MCV: 78.2 fl (ref 78.0–100.0)
Platelets: 217 10*3/uL (ref 150.0–400.0)
RBC: 3.57 Mil/uL — AB (ref 4.22–5.81)
RDW: 16.4 % — ABNORMAL HIGH (ref 11.5–15.5)
WBC: 4.1 10*3/uL (ref 4.0–10.5)

## 2018-03-05 LAB — BASIC METABOLIC PANEL
BUN: 23 mg/dL (ref 6–23)
CALCIUM: 8.4 mg/dL (ref 8.4–10.5)
CHLORIDE: 101 meq/L (ref 96–112)
CO2: 24 meq/L (ref 19–32)
Creatinine, Ser: 1.76 mg/dL — ABNORMAL HIGH (ref 0.40–1.50)
GFR: 48.92 mL/min — ABNORMAL LOW (ref >60.00)
Glucose, Bld: 186 mg/dL — ABNORMAL HIGH (ref 70–99)
POTASSIUM: 4 meq/L (ref 3.5–5.1)
SODIUM: 135 meq/L (ref 135–145)

## 2018-03-05 LAB — URIC ACID: Uric Acid, Serum: 4.8 mg/dL (ref 4.0–7.8)

## 2018-03-05 LAB — IRON,TIBC AND FERRITIN PANEL
%SAT: 18 % (calc) — ABNORMAL LOW (ref 20–48)
FERRITIN: 416 ng/mL — AB (ref 24–380)
Iron: 42 ug/dL — ABNORMAL LOW (ref 50–180)
TIBC: 238 mcg/dL (calc) — ABNORMAL LOW (ref 250–425)

## 2018-03-05 NOTE — Progress Notes (Signed)
Your anemia is stable but still much lower than it was several months ago. This looks like anemia of chronic disease but I wonder if you could have coexisting iron deficiency anemia. I am going to forward this message to Dr. [name] to make sure hes ok with this but I want to consider twice a week iron for you- may help with fatigue potentially.   Your CBC was normal (blood counts, infection fighting cells, platelets). Your CMET was normal (kidney, liver, and electrolytes, blood sugar) for you- kidneys have improved some from last check. Sugar high but we know you have diabetes.  Uric acid was at goal of 6 or less.

## 2018-03-14 ENCOUNTER — Other Ambulatory Visit: Payer: Self-pay | Admitting: *Deleted

## 2018-03-14 ENCOUNTER — Other Ambulatory Visit (HOSPITAL_COMMUNITY): Payer: Self-pay | Admitting: Interventional Radiology

## 2018-03-14 DIAGNOSIS — C7B8 Other secondary neuroendocrine tumors: Secondary | ICD-10-CM

## 2018-03-14 DIAGNOSIS — C7A8 Other malignant neuroendocrine tumors: Secondary | ICD-10-CM

## 2018-03-26 ENCOUNTER — Telehealth: Payer: Self-pay | Admitting: Oncology

## 2018-03-26 ENCOUNTER — Inpatient Hospital Stay: Payer: Medicare Other | Attending: Internal Medicine

## 2018-03-26 ENCOUNTER — Encounter: Payer: Self-pay | Admitting: Oncology

## 2018-03-26 ENCOUNTER — Inpatient Hospital Stay (HOSPITAL_BASED_OUTPATIENT_CLINIC_OR_DEPARTMENT_OTHER): Payer: Medicare Other | Admitting: Oncology

## 2018-03-26 VITALS — BP 146/73 | HR 87 | Temp 98.7°F | Resp 18 | Ht 75.0 in | Wt 171.1 lb

## 2018-03-26 DIAGNOSIS — R5383 Other fatigue: Secondary | ICD-10-CM

## 2018-03-26 DIAGNOSIS — C7B8 Other secondary neuroendocrine tumors: Secondary | ICD-10-CM

## 2018-03-26 DIAGNOSIS — C7A8 Other malignant neuroendocrine tumors: Secondary | ICD-10-CM

## 2018-03-26 DIAGNOSIS — Z5111 Encounter for antineoplastic chemotherapy: Secondary | ICD-10-CM

## 2018-03-26 LAB — CBC WITH DIFFERENTIAL (CANCER CENTER ONLY)
BASOS ABS: 0 10*3/uL (ref 0.0–0.1)
BASOS PCT: 1 %
EOS ABS: 0 10*3/uL (ref 0.0–0.5)
EOS PCT: 0 %
HCT: 27.3 % — ABNORMAL LOW (ref 38.4–49.9)
HEMOGLOBIN: 9 g/dL — AB (ref 13.0–17.1)
Lymphocytes Relative: 31 %
Lymphs Abs: 1.1 10*3/uL (ref 0.9–3.3)
MCH: 25.8 pg — ABNORMAL LOW (ref 27.2–33.4)
MCHC: 32.9 g/dL (ref 32.0–36.0)
MCV: 78.5 fL — ABNORMAL LOW (ref 79.3–98.0)
Monocytes Absolute: 0.3 10*3/uL (ref 0.1–0.9)
Monocytes Relative: 9 %
NEUTROS PCT: 59 %
Neutro Abs: 2.2 10*3/uL (ref 1.5–6.5)
PLATELETS: 163 10*3/uL (ref 140–400)
RBC: 3.48 MIL/uL — AB (ref 4.20–5.82)
RDW: 17.5 % — ABNORMAL HIGH (ref 11.0–14.6)
WBC: 3.7 10*3/uL — AB (ref 4.0–10.3)

## 2018-03-26 LAB — CMP (CANCER CENTER ONLY)
ALBUMIN: 3.5 g/dL (ref 3.5–5.0)
ALT: 20 U/L (ref 0–44)
AST: 21 U/L (ref 15–41)
Alkaline Phosphatase: 78 U/L (ref 38–126)
Anion gap: 10 (ref 5–15)
BUN: 22 mg/dL (ref 8–23)
CHLORIDE: 106 mmol/L (ref 98–111)
CO2: 23 mmol/L (ref 22–32)
CREATININE: 1.79 mg/dL — AB (ref 0.61–1.24)
Calcium: 8.5 mg/dL — ABNORMAL LOW (ref 8.9–10.3)
GFR, Est AFR Am: 41 mL/min — ABNORMAL LOW (ref >60)
GFR, Estimated: 36 mL/min — ABNORMAL LOW (ref >60)
GLUCOSE: 183 mg/dL — AB (ref 70–99)
Potassium: 4.1 mmol/L (ref 3.5–5.1)
SODIUM: 139 mmol/L (ref 135–145)
Total Bilirubin: 0.4 mg/dL (ref 0.3–1.2)
Total Protein: 7.2 g/dL (ref 6.5–8.1)
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
## 2018-03-26 NOTE — Assessment & Plan Note (Signed)
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 The patient was started on treatment with Afinitor 10 mg by mouth daily status post 2 months of treatment. This was followed by reduction of his dose to 7.5 mg by mouth daily and he is tolerating this dose much better.  He also underwent radio embolization of metastatic lesion in the liver by interventional radiology on Jan 09, 2018. The patient continues to tolerate his treatment fairly well with no concerning complaints. I recommended for him to continue his current treatment with Afinitor with the same dose. The patient will follow-up in 1 month for evaluation and repeat lab work.  He is having an MRI of the abdomen later this month by interventional radiology.  I have added a CT of the chest to be performed just prior to his next visit.  We will discuss the results of his CT scan at his next visit.  He was advised to call immediately if he has any concerning symptoms in the interval. The patient voices understanding of current disease status and treatment options and is in agreement with the current care plan. All questions were answered. The patient knows to call the clinic with any problems, questions or concerns. We can certainly see the patient much sooner if necessary.

## 2018-03-26 NOTE — Telephone Encounter (Signed)
Scheduled appt per 8/ los - gave patient aVS and calender per los.

## 2018-04-09 ENCOUNTER — Inpatient Hospital Stay: Payer: Medicare Other

## 2018-04-09 DIAGNOSIS — C7A8 Other malignant neuroendocrine tumors: Secondary | ICD-10-CM

## 2018-04-09 DIAGNOSIS — R5383 Other fatigue: Secondary | ICD-10-CM | POA: Diagnosis not present

## 2018-04-09 DIAGNOSIS — C7B8 Other secondary neuroendocrine tumors: Secondary | ICD-10-CM | POA: Diagnosis not present

## 2018-04-09 LAB — CMP (CANCER CENTER ONLY)
ALBUMIN: 3.5 g/dL (ref 3.5–5.0)
ALT: 14 U/L (ref 0–44)
AST: 17 U/L (ref 15–41)
Alkaline Phosphatase: 81 U/L (ref 38–126)
Anion gap: 6 (ref 5–15)
BUN: 20 mg/dL (ref 8–23)
CHLORIDE: 108 mmol/L (ref 98–111)
CO2: 27 mmol/L (ref 22–32)
CREATININE: 1.42 mg/dL — AB (ref 0.61–1.24)
Calcium: 8.3 mg/dL — ABNORMAL LOW (ref 8.9–10.3)
GFR, Est AFR Am: 55 mL/min — ABNORMAL LOW (ref >60)
GFR, Estimated: 47 mL/min — ABNORMAL LOW (ref >60)
Glucose, Bld: 137 mg/dL — ABNORMAL HIGH (ref 70–99)
Potassium: 3.8 mmol/L (ref 3.5–5.1)
Sodium: 141 mmol/L (ref 135–145)
Total Bilirubin: 0.3 mg/dL (ref 0.3–1.2)
Total Protein: 7.2 g/dL (ref 6.5–8.1)

## 2018-04-09 LAB — CBC WITH DIFFERENTIAL (CANCER CENTER ONLY)
BASOS ABS: 0 10*3/uL (ref 0.0–0.1)
Basophils Relative: 1 %
EOS ABS: 0 10*3/uL (ref 0.0–0.5)
EOS PCT: 1 %
HCT: 28.2 % — ABNORMAL LOW (ref 38.4–49.9)
Hemoglobin: 9.1 g/dL — ABNORMAL LOW (ref 13.0–17.1)
LYMPHS PCT: 34 %
Lymphs Abs: 1.2 10*3/uL (ref 0.9–3.3)
MCH: 25.4 pg — ABNORMAL LOW (ref 27.2–33.4)
MCHC: 32.3 g/dL (ref 32.0–36.0)
MCV: 78.5 fL — AB (ref 79.3–98.0)
Monocytes Absolute: 0.3 10*3/uL (ref 0.1–0.9)
Monocytes Relative: 10 %
Neutro Abs: 1.8 10*3/uL (ref 1.5–6.5)
Neutrophils Relative %: 54 %
PLATELETS: 150 10*3/uL (ref 140–400)
RBC: 3.59 MIL/uL — AB (ref 4.20–5.82)
RDW: 18.1 % — ABNORMAL HIGH (ref 11.0–14.6)
WBC: 3.4 10*3/uL — AB (ref 4.0–10.3)

## 2018-04-10 LAB — PROTIME-INR
INR: 1
Prothrombin Time: 10.1 s (ref 9.0–11.5)

## 2018-04-10 LAB — CBC

## 2018-04-10 LAB — COMPLETE METABOLIC PANEL WITH GFR

## 2018-04-16 ENCOUNTER — Ambulatory Visit
Admission: RE | Admit: 2018-04-16 | Discharge: 2018-04-16 | Disposition: A | Discharge status: Home / Self Care | Payer: Medicare Other | Source: Ambulatory Visit | Attending: Interventional Radiology | Admitting: Interventional Radiology

## 2018-04-16 ENCOUNTER — Ambulatory Visit (HOSPITAL_COMMUNITY)
Admission: RE | Admit: 2018-04-16 | Discharge: 2018-04-16 | Disposition: A | Discharge status: Home / Self Care | Payer: Medicare Other | Source: Ambulatory Visit | Attending: Interventional Radiology | Admitting: Interventional Radiology

## 2018-04-16 DIAGNOSIS — C3490 Malignant neoplasm of unspecified part of unspecified bronchus or lung: Secondary | ICD-10-CM | POA: Diagnosis not present

## 2018-04-16 DIAGNOSIS — C7B8 Other secondary neuroendocrine tumors: Secondary | ICD-10-CM | POA: Insufficient documentation

## 2018-04-16 DIAGNOSIS — C7A8 Other malignant neuroendocrine tumors: Secondary | ICD-10-CM

## 2018-04-16 DIAGNOSIS — C787 Secondary malignant neoplasm of liver and intrahepatic bile duct: Secondary | ICD-10-CM | POA: Diagnosis not present

## 2018-04-16 DIAGNOSIS — C7A1 Malignant poorly differentiated neuroendocrine tumors: Secondary | ICD-10-CM | POA: Diagnosis not present

## 2018-04-16 HISTORY — PX: IR RADIOLOGIST EVAL & MGMT: IMG5224

## 2018-04-16 MED ORDER — GADOBENATE DIMEGLUMINE 529 MG/ML IV SOLN
20.0000 mL | Freq: Once | INTRAVENOUS | Status: AC | PRN
  Administered 2018-04-16: 16 mL via INTRAVENOUS
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
## 2018-04-26 ENCOUNTER — Ambulatory Visit (HOSPITAL_COMMUNITY)
Admission: RE | Admit: 2018-04-26 | Discharge: 2018-04-26 | Disposition: A | Discharge status: Home / Self Care | Payer: Medicare Other | Source: Ambulatory Visit | Attending: Oncology | Admitting: Oncology

## 2018-04-26 DIAGNOSIS — J439 Emphysema, unspecified: Secondary | ICD-10-CM | POA: Insufficient documentation

## 2018-04-26 DIAGNOSIS — I700 Atherosclerosis of aorta: Secondary | ICD-10-CM | POA: Insufficient documentation

## 2018-04-26 DIAGNOSIS — C7A8 Other malignant neuroendocrine tumors: Secondary | ICD-10-CM | POA: Diagnosis not present

## 2018-04-26 DIAGNOSIS — Z5111 Encounter for antineoplastic chemotherapy: Secondary | ICD-10-CM | POA: Diagnosis not present

## 2018-04-26 DIAGNOSIS — J479 Bronchiectasis, uncomplicated: Secondary | ICD-10-CM | POA: Insufficient documentation

## 2018-04-26 DIAGNOSIS — C787 Secondary malignant neoplasm of liver and intrahepatic bile duct: Secondary | ICD-10-CM | POA: Insufficient documentation

## 2018-04-26 DIAGNOSIS — C3491 Malignant neoplasm of unspecified part of right bronchus or lung: Secondary | ICD-10-CM | POA: Diagnosis not present

## 2018-04-26 MED ORDER — IOHEXOL 300 MG/ML  SOLN
75.0000 mL | Freq: Once | INTRAMUSCULAR | Status: AC | PRN
  Administered 2018-04-26: 75 mL via INTRAVENOUS

## 2018-05-02 ENCOUNTER — Inpatient Hospital Stay: Payer: Medicare Other | Attending: Internal Medicine

## 2018-05-02 ENCOUNTER — Ambulatory Visit (HOSPITAL_COMMUNITY): Payer: Medicare Other

## 2018-05-02 DIAGNOSIS — Z79899 Other long term (current) drug therapy: Secondary | ICD-10-CM | POA: Diagnosis not present

## 2018-05-02 DIAGNOSIS — C7A8 Other malignant neuroendocrine tumors: Secondary | ICD-10-CM | POA: Insufficient documentation

## 2018-05-02 DIAGNOSIS — D649 Anemia, unspecified: Secondary | ICD-10-CM | POA: Insufficient documentation

## 2018-05-02 DIAGNOSIS — R5383 Other fatigue: Secondary | ICD-10-CM | POA: Insufficient documentation

## 2018-05-02 DIAGNOSIS — C7B8 Other secondary neuroendocrine tumors: Secondary | ICD-10-CM | POA: Diagnosis not present

## 2018-05-02 LAB — CMP (CANCER CENTER ONLY)
ALT: 18 U/L (ref 0–44)
AST: 18 U/L (ref 15–41)
Albumin: 3.6 g/dL (ref 3.5–5.0)
Alkaline Phosphatase: 86 U/L (ref 38–126)
Anion gap: 9 (ref 5–15)
BILIRUBIN TOTAL: 0.2 mg/dL — AB (ref 0.3–1.2)
BUN: 22 mg/dL (ref 8–23)
CALCIUM: 9 mg/dL (ref 8.9–10.3)
CO2: 26 mmol/L (ref 22–32)
Chloride: 106 mmol/L (ref 98–111)
Creatinine: 1.62 mg/dL — ABNORMAL HIGH (ref 0.61–1.24)
GFR, EST AFRICAN AMERICAN: 47 mL/min — AB (ref >60)
GFR, EST NON AFRICAN AMERICAN: 40 mL/min — AB (ref >60)
GLUCOSE: 157 mg/dL — AB (ref 70–99)
Potassium: 4.1 mmol/L (ref 3.5–5.1)
Sodium: 141 mmol/L (ref 135–145)
TOTAL PROTEIN: 7.4 g/dL (ref 6.5–8.1)

## 2018-05-02 LAB — CBC WITH DIFFERENTIAL (CANCER CENTER ONLY)
BASOS ABS: 0 10*3/uL (ref 0.0–0.1)
BASOS PCT: 1 %
EOS ABS: 0 10*3/uL (ref 0.0–0.5)
EOS PCT: 1 %
HEMATOCRIT: 31.4 % — AB (ref 38.4–49.9)
Hemoglobin: 10.2 g/dL — ABNORMAL LOW (ref 13.0–17.1)
Lymphocytes Relative: 47 %
Lymphs Abs: 1.6 10*3/uL (ref 0.9–3.3)
MCH: 25.8 pg — ABNORMAL LOW (ref 27.2–33.4)
MCHC: 32.3 g/dL (ref 32.0–36.0)
MCV: 79.7 fL (ref 79.3–98.0)
MONO ABS: 0.4 10*3/uL (ref 0.1–0.9)
MONOS PCT: 10 %
Neutro Abs: 1.4 10*3/uL — ABNORMAL LOW (ref 1.5–6.5)
Neutrophils Relative %: 41 %
PLATELETS: 151 10*3/uL (ref 140–400)
RBC: 3.95 MIL/uL — ABNORMAL LOW (ref 4.20–5.82)
RDW: 18 % — AB (ref 11.0–14.6)
WBC Count: 3.4 10*3/uL — ABNORMAL LOW (ref 4.0–10.3)

## 2018-05-07 ENCOUNTER — Telehealth: Payer: Self-pay | Admitting: Hematology

## 2018-05-07 ENCOUNTER — Encounter: Payer: Self-pay | Admitting: Internal Medicine

## 2018-05-07 ENCOUNTER — Inpatient Hospital Stay (HOSPITAL_BASED_OUTPATIENT_CLINIC_OR_DEPARTMENT_OTHER): Payer: Medicare Other | Admitting: Internal Medicine

## 2018-05-07 ENCOUNTER — Telehealth: Payer: Self-pay | Admitting: Internal Medicine

## 2018-05-07 VITALS — BP 139/82 | HR 91 | Temp 98.3°F | Resp 18 | Ht 75.0 in | Wt 174.9 lb

## 2018-05-07 DIAGNOSIS — R5383 Other fatigue: Secondary | ICD-10-CM | POA: Diagnosis not present

## 2018-05-07 DIAGNOSIS — C7B8 Other secondary neuroendocrine tumors: Secondary | ICD-10-CM | POA: Diagnosis not present

## 2018-05-07 DIAGNOSIS — C3491 Malignant neoplasm of unspecified part of right bronchus or lung: Secondary | ICD-10-CM

## 2018-05-07 DIAGNOSIS — C7A8 Other malignant neuroendocrine tumors: Secondary | ICD-10-CM

## 2018-05-07 DIAGNOSIS — D649 Anemia, unspecified: Secondary | ICD-10-CM | POA: Insufficient documentation

## 2018-05-07 DIAGNOSIS — Z5111 Encounter for antineoplastic chemotherapy: Secondary | ICD-10-CM

## 2018-05-07 DIAGNOSIS — Z79899 Other long term (current) drug therapy: Secondary | ICD-10-CM | POA: Diagnosis not present

## 2018-05-07 DIAGNOSIS — D630 Anemia in neoplastic disease: Secondary | ICD-10-CM
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
## 2018-05-07 NOTE — Telephone Encounter (Signed)
appts scheduled avs calendar printed per 9/17 los °

## 2018-05-28 ENCOUNTER — Encounter: Payer: Self-pay | Admitting: Sports Medicine

## 2018-05-28 ENCOUNTER — Ambulatory Visit (INDEPENDENT_AMBULATORY_CARE_PROVIDER_SITE_OTHER): Payer: Medicare Other | Admitting: Sports Medicine

## 2018-05-28 DIAGNOSIS — E119 Type 2 diabetes mellitus without complications: Secondary | ICD-10-CM | POA: Diagnosis not present

## 2018-05-28 DIAGNOSIS — M79674 Pain in right toe(s): Secondary | ICD-10-CM

## 2018-05-28 DIAGNOSIS — I739 Peripheral vascular disease, unspecified: Secondary | ICD-10-CM | POA: Diagnosis not present

## 2018-05-28 DIAGNOSIS — B351 Tinea unguium: Secondary | ICD-10-CM | POA: Diagnosis not present

## 2018-05-28 DIAGNOSIS — M79675 Pain in left toe(s): Secondary | ICD-10-CM

## 2018-05-28 NOTE — Progress Notes (Signed)
Subjective: Richard Navarro is a 72 y.o. male patient returns to office for Diabetic nail trim.  Patient is type II diabetic.  Denies any acute problems or issues. Patient denies any changes with meds or health since last visit.   FBS 115 and last A1c of 6.1  Patient Active Problem List   Diagnosis Date Noted  . 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 CKD (chronic kidney disease), stage III ([place]) 03/04/2018  . Myalgia due to statin 03/04/2018  . Pneumonia 08/09/2017  . Neuroendocrine carcinoma ([place]) 08/09/2017  . Tachycardia 08/09/2017  . Urinary frequency 04/27/2017  . Encounter for antineoplastic chemotherapy 01/10/2017  . Goals of care, counseling/discussion 01/10/2017  . Primary cancer of right lung ([facility]) 12/22/2016  . COPD (chronic obstructive pulmonary disease) ([place]) 12/01/2016  . PTSD (post-traumatic stress disorder) 12/07/2015  . Prostate cancer screening 04/17/2014  . Erectile dysfunction 04/09/2014  . Arthritis 03/26/2014  . History of adenomatous polyp of colon 03/26/2014  . Former smoker 03/26/2014  . Diabetes mellitus without complication ([place])   . Hypertension   . Gout   . Depression   . GERD (gastroesophageal reflux disease)   . Hyperlipidemia     Current Outpatient Medications on File Prior to Visit  Medication Sig Dispense Refill  . AFINITOR 7.5 MG tablet Take 1 tablet (7.5 mg total) by mouth daily. 90 tablet 2  . albuterol (PROVENTIL HFA;VENTOLIN HFA) 108 (90 Base) MCG/ACT inhaler Inhale 2 puffs into the lungs every 4 (four) hours as needed for wheezing or shortness of breath. 1 Inhaler 0  . allopurinol (ZYLOPRIM) 100 MG tablet TAKE 1 TABLET DAILY 90 tablet 3  . amLODipine (NORVASC) 5 MG tablet Take 1 tablet (5 mg total) daily by mouth. 90 tablet 3  . Ferrous Sulfate (IRON) 325 (65 Fe) MG TABS Take 1 tablet by mouth daily.    Marland Kitchen glimepiride (AMARYL) 2 MG tablet TAKE 1 TABLET ONCE DAILY BEFORE BREAKFAST. 90 tablet 3  . glucose blood test strip Use to test  blood sugar twice a day 200 each 3  . loperamide (IMODIUM A-D) 2 MG tablet Take 1 mg by mouth daily as needed for diarrhea or loose stools.    . metFORMIN (GLUCOPHAGE) 500 MG tablet Take 1 tablet (500 mg total) 2 (two) times daily with a meal by mouth. 180 tablet 3  . pantoprazole (PROTONIX) 40 MG tablet Take 1 tablet (40 mg total) daily by mouth. 90 tablet 3  . Tetrahydrozoline HCl (EYE DROPS OP) Place 1 drop into both eyes daily as needed (redness relief).    . vitamin B-12 (CYANOCOBALAMIN) 1000 MCG tablet Take 1,000 mcg by mouth daily.    . vitamin C (ASCORBIC ACID) 500 MG tablet Take 500 mg by mouth daily.    . VOLTAREN 1 % GEL APPLY 2 GRAMS TOPICALLY FOUR TIMES A DAY AS NEEDED FOR LEFT SHOULDER ARTHRITIS 300 g 3   No current facility-administered medications on file prior to visit.     Allergies  Allergen Reactions  . Lisinopril Swelling    Angioedema- on this and afinitor same time  . Simvastatin Other (See Comments)    Joint ache    Objective:  General: Alert and oriented x3 in no acute distress  Dermatology: No open lesions bilateral lower extremities, no webspace macerations, no ecchymosis bilateral, all nails x 10 are mildly thickened and elongated.   Vascular: Dorsalis Pedis 1/4 and Posterior Tibial pedal pulses 0/4, Capillary Fill Time 5 seconds,Scant pedal hair growth bilateral, +1  pitting edema bilateral lower extremities L=R, Temperature gradient within normal limits.  Neurology: Johney Maine sensation intact via light touch bilateral.  Musculoskeletal: Semi-flexible hammertoes 2-5 with no tenderness with palpation at PIPJ Left 2nd toe. No other sympathic pedal deformities noted bilateral. No pain with calf compression bilateral.  Strength within normal limits in all groups bilateral.        Assessment and Plan: Problem List Items Addressed This Visit      Endocrine   Diabetes mellitus without complication ([place])    Other Visit Diagnoses    Pain due to onychomycosis of  toenails of both feet    -  Primary   PVD (peripheral vascular disease) ([place])         -Complete examination performed -Mechanically debrided nails x 10 using sterile nail nipper without incident  -Recommend elevation of legs to assist with edema control -Patient to return to office in 12 weeks for Diabetic nail care or sooner if condition worsens.  Landis Martins, DPM

## 2018-06-03 ENCOUNTER — Inpatient Hospital Stay (HOSPITAL_BASED_OUTPATIENT_CLINIC_OR_DEPARTMENT_OTHER): Payer: Medicare Other | Admitting: Oncology

## 2018-06-03 ENCOUNTER — Encounter: Payer: Self-pay | Admitting: Oncology

## 2018-06-03 ENCOUNTER — Telehealth: Payer: Self-pay | Admitting: Oncology

## 2018-06-03 ENCOUNTER — Inpatient Hospital Stay: Payer: Medicare Other | Attending: Internal Medicine

## 2018-06-03 VITALS — BP 111/76 | HR 87 | Temp 98.0°F | Resp 19 | Ht 75.0 in | Wt 176.3 lb

## 2018-06-03 DIAGNOSIS — C7A8 Other malignant neuroendocrine tumors: Secondary | ICD-10-CM

## 2018-06-03 DIAGNOSIS — R7989 Other specified abnormal findings of blood chemistry: Secondary | ICD-10-CM

## 2018-06-03 DIAGNOSIS — C7B8 Other secondary neuroendocrine tumors: Secondary | ICD-10-CM | POA: Diagnosis not present

## 2018-06-03 DIAGNOSIS — R5383 Other fatigue: Secondary | ICD-10-CM

## 2018-06-03 DIAGNOSIS — D649 Anemia, unspecified: Secondary | ICD-10-CM | POA: Diagnosis not present

## 2018-06-03 DIAGNOSIS — Z79899 Other long term (current) drug therapy: Secondary | ICD-10-CM | POA: Insufficient documentation

## 2018-06-03 DIAGNOSIS — Z5111 Encounter for antineoplastic chemotherapy: Secondary | ICD-10-CM

## 2018-06-03 LAB — CMP (CANCER CENTER ONLY)
ALT: 22 U/L (ref 0–44)
AST: 19 U/L (ref 15–41)
Albumin: 3.5 g/dL (ref 3.5–5.0)
Alkaline Phosphatase: 77 U/L (ref 38–126)
Anion gap: 12 (ref 5–15)
BILIRUBIN TOTAL: 0.3 mg/dL (ref 0.3–1.2)
BUN: 24 mg/dL — ABNORMAL HIGH (ref 8–23)
CALCIUM: 8.8 mg/dL — AB (ref 8.9–10.3)
CO2: 23 mmol/L (ref 22–32)
CREATININE: 2 mg/dL — AB (ref 0.61–1.24)
Chloride: 105 mmol/L (ref 98–111)
GFR, EST NON AFRICAN AMERICAN: 31 mL/min — AB (ref >60)
GFR, Est AFR Am: 36 mL/min — ABNORMAL LOW (ref >60)
Glucose, Bld: 260 mg/dL — ABNORMAL HIGH (ref 70–99)
Potassium: 4.1 mmol/L (ref 3.5–5.1)
SODIUM: 140 mmol/L (ref 135–145)
TOTAL PROTEIN: 7.2 g/dL (ref 6.5–8.1)

## 2018-06-03 LAB — CBC WITH DIFFERENTIAL (CANCER CENTER ONLY)
Abs Immature Granulocytes: 0.01 10*3/uL (ref 0.00–0.07)
BASOS PCT: 0 %
Basophils Absolute: 0 10*3/uL (ref 0.0–0.1)
EOS ABS: 0 10*3/uL (ref 0.0–0.5)
EOS PCT: 0 %
HCT: 32.2 % — ABNORMAL LOW (ref 39.0–52.0)
HEMOGLOBIN: 10.4 g/dL — AB (ref 13.0–17.0)
Immature Granulocytes: 0 %
Lymphocytes Relative: 35 %
Lymphs Abs: 1.2 10*3/uL (ref 0.7–4.0)
MCH: 25.7 pg — AB (ref 26.0–34.0)
MCHC: 32.3 g/dL (ref 30.0–36.0)
MCV: 79.5 fL — ABNORMAL LOW (ref 80.0–100.0)
MONO ABS: 0.3 10*3/uL (ref 0.1–1.0)
Monocytes Relative: 8 %
Neutro Abs: 2 10*3/uL (ref 1.7–7.7)
Neutrophils Relative %: 57 %
PLATELETS: 129 10*3/uL — AB (ref 150–400)
RBC: 4.05 MIL/uL — AB (ref 4.22–5.81)
RDW: 15.6 % — ABNORMAL HIGH (ref 11.5–15.5)
WBC: 3.6 10*3/uL — AB (ref 4.0–10.5)
nRBC: 0 % (ref 0.0–0.2)

## 2018-06-03 MED ORDER — AFINITOR 7.5 MG PO TABS: 7.5000 mg | ORAL_TABLET | Freq: Every day | ORAL | 2 refills | Status: DC

## 2018-06-03 NOTE — Telephone Encounter (Signed)
Scheduled appt per 10/14 los - pt is aware of appt date and time.
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
## 2018-06-03 NOTE — Assessment & Plan Note (Addendum)
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 The patient was started on treatment with Afinitor 10 mg by mouth daily status post 2 months of treatment. This was followed by reduction of his dose to 7.5 mg by mouth daily status post 16 months and he is tolerating this dose much better.  He also underwent radio embolization of metastatic lesion in the liver by interventional radiology on Jan 09, 2018. The patient is feeling very well today and he continues to tolerate his treatment with Afinitor fairly well. Recommend for him to continue Afinitor 7.5 mg daily.  He was advised to increase his water intake due to the slightly elevated BUN and creatinine.   He will follow-up in 1 month for evaluation and repeat lab work.    For the anemia, he will continue on the oral iron tablets for now.  He was advised to call immediately if he has any concerning symptoms in the interval. The patient voices understanding of current disease status and treatment options and is in agreement with the current care plan. All questions were answered. The patient knows to call the clinic with any problems, questions or concerns. We can certainly see the patient much sooner if necessary.

## 2018-06-04 ENCOUNTER — Ambulatory Visit (INDEPENDENT_AMBULATORY_CARE_PROVIDER_SITE_OTHER): Payer: Medicare Other | Admitting: Family Medicine

## 2018-06-04 ENCOUNTER — Encounter: Payer: Self-pay | Admitting: Family Medicine

## 2018-06-04 VITALS — BP 108/62 | HR 74 | Temp 97.8°F | Ht 75.0 in | Wt 177.4 lb

## 2018-06-04 DIAGNOSIS — Z23 Encounter for immunization: Secondary | ICD-10-CM | POA: Diagnosis not present

## 2018-06-04 DIAGNOSIS — E119 Type 2 diabetes mellitus without complications: Secondary | ICD-10-CM

## 2018-06-04 DIAGNOSIS — I10 Essential (primary) hypertension: Secondary | ICD-10-CM

## 2018-06-04 DIAGNOSIS — M1A09X0 Idiopathic chronic gout, multiple sites, without tophus (tophi): Secondary | ICD-10-CM | POA: Diagnosis not present

## 2018-06-04 MED ORDER — GLIMEPIRIDE 2 MG PO TABS: ORAL_TABLET | ORAL | 3 refills | Status: DC

## 2018-06-04 MED ORDER — PANTOPRAZOLE SODIUM 40 MG PO TBEC: 40.0000 mg | DELAYED_RELEASE_TABLET | Freq: Every day | ORAL | 3 refills | Status: DC

## 2018-06-04 MED ORDER — GLUCOSE BLOOD VI STRP: ORAL_STRIP | 3 refills | Status: DC

## 2018-06-04 MED ORDER — ALLOPURINOL 100 MG PO TABS: 100.0000 mg | ORAL_TABLET | Freq: Every day | ORAL | 3 refills | Status: DC

## 2018-06-04 MED ORDER — AMLODIPINE BESYLATE 5 MG PO TABS: 5.0000 mg | ORAL_TABLET | Freq: Every day | ORAL | 3 refills | Status: DC

## 2018-06-04 MED ORDER — DICLOFENAC SODIUM 1 % TD GEL: TRANSDERMAL | 3 refills | Status: DC

## 2018-06-04 MED ORDER — METFORMIN HCL 500 MG PO TABS: 500.0000 mg | ORAL_TABLET | Freq: Two times a day (BID) | ORAL | 3 refills | Status: DC

## 2018-06-04 NOTE — Assessment & Plan Note (Signed)
S: controlled on amlodipine 5 mg BP Readings from Last 3 Encounters:  06/04/18 108/62  06/03/18 111/76  05/07/18 139/82  A/P: We discussed blood pressure goal of <140/90. Continue current meds

## 2018-06-04 NOTE — Patient Instructions (Signed)
High dose flu shot today  I ordered a future hemoglobin a1c- if the cancer center cant add this- stop by for this sometime in next month or so- need lab visit

## 2018-06-04 NOTE — Assessment & Plan Note (Signed)
S: 0 flares in last 3 months on allopurinol 100mg  Lab Results  Component Value Date   LABURIC 4.8 03/04/2018  A/P: continue current medication

## 2018-06-04 NOTE — Progress Notes (Signed)
Subjective:  Richard Navarro is a 72 y.o. year old very pleasant male patient who presents for/with See problem oriented charting ROS- No chest pain. Stable shortness of breath- seemed worse in summer (has always struggled with heat). No headache or blurry vision- other than mild in AM before clears eyes out  Past Medical History-  Patient Active Problem List   Diagnosis Date Noted  . Neuroendocrine carcinoma ([place]) 08/09/2017    Priority: High  . Primary cancer of right lung ([place]) 12/22/2016    Priority: High  . Diabetes mellitus without complication ([place])     Priority: High  . CKD (chronic kidney disease), stage III ([place]) 03/04/2018    Priority: Medium  . Myalgia due to statin 03/04/2018    Priority: Medium  . COPD (chronic obstructive pulmonary disease) ([place]) 12/01/2016    Priority: Medium  . PTSD (post-traumatic stress disorder) 12/07/2015    Priority: Medium  . Erectile dysfunction 04/09/2014    Priority: Medium  . Former smoker 03/26/2014    Priority: Medium  . Hypertension     Priority: Medium  . Gout     Priority: Medium  . Depression     Priority: Medium  . Hyperlipidemia     Priority: Medium  . Pneumonia 08/09/2017    Priority: Low  . Encounter for antineoplastic chemotherapy 01/10/2017    Priority: Low  . Goals of care, counseling/discussion 01/10/2017    Priority: Low  . Prostate cancer screening 04/17/2014    Priority: Low  . Arthritis 03/26/2014    Priority: Low  . History of adenomatous polyp of colon 03/26/2014    Priority: Low  . GERD (gastroesophageal reflux disease)     Priority: Low  . 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 Tachycardia 08/09/2017  . Urinary frequency 04/27/2017    Medications- reviewed and updated Current Outpatient Medications  Medication Sig Dispense Refill  . AFINITOR 7.5 MG tablet Take 1 tablet (7.5 mg total) by mouth daily. 90 tablet 2  . albuterol (PROVENTIL HFA;VENTOLIN HFA) 108 (90 Base) MCG/ACT inhaler Inhale 2  puffs into the lungs every 4 (four) hours as needed for wheezing or shortness of breath. 1 Inhaler 0  . allopurinol (ZYLOPRIM) 100 MG tablet Take 1 tablet (100 mg total) by mouth daily. 90 tablet 3  . amLODipine (NORVASC) 5 MG tablet Take 1 tablet (5 mg total) by mouth daily. 90 tablet 3  . diclofenac sodium (VOLTAREN) 1 % GEL APPLY 2 GRAMS TOPICALLY FOUR TIMES A DAY AS NEEDED FOR LEFT SHOULDER ARTHRITIS 300 g 3  . Ferrous Sulfate (IRON) 325 (65 Fe) MG TABS Take 1 tablet by mouth daily.    Marland Kitchen glimepiride (AMARYL) 2 MG tablet TAKE 1 TABLET ONCE DAILY BEFORE BREAKFAST. 90 tablet 3  . glucose blood test strip Use to test blood sugar twice a day 200 each 3  . loperamide (IMODIUM A-D) 2 MG tablet Take 1 mg by mouth daily as needed for diarrhea or loose stools.    . metFORMIN (GLUCOPHAGE) 500 MG tablet Take 1 tablet (500 mg total) by mouth 2 (two) times daily with a meal. 180 tablet 3  . pantoprazole (PROTONIX) 40 MG tablet Take 1 tablet (40 mg total) by mouth daily. 90 tablet 3  . Tetrahydrozoline HCl (EYE DROPS OP) Place 1 drop into both eyes daily as needed (redness relief).    . vitamin B-12 (CYANOCOBALAMIN) 1000 MCG tablet Take 1,000 mcg by mouth daily.    . vitamin C (ASCORBIC ACID) 500  MG tablet Take 500 mg by mouth daily.     No current facility-administered medications for this visit.     Objective: BP 108/62 (BP Location: Left Arm, Patient Position: Sitting, Cuff Size: Large)   Pulse 74   Temp 97.8 F (36.6 C) (Oral)   Ht 6\' 3"  (1.905 m)   Wt 177 lb 6.4 oz (80.5 kg)   SpO2 93%   BMI 22.17 kg/m  Gen: NAD, resting comfortably CV: RRR no murmurs rubs or gallops Lungs: CTAB no crackles, wheeze, rhonchi Abdomen: soft/nontender/nondistended/normal bowel sounds.  Ext: 1+ edema Skin: warm, dry  Assessment/Plan:  Albuterol helps but uses prn  Diabetes mellitus without complication ([facility]) S:  controlled on metformin 500mg  BID and amaryl 2mg . Have to watch GFR with last GFR in mid 30s-  noted yesterday- he was to push fluid intake Lab Results  Component Value Date   HGBA1C 7.8 (A) 03/04/2018   HGBA1C 7.8 11/26/2017   HGBA1C 7.2 07/05/2017   A/P: have to watch GFR carefully- may have to reduce/stop metformin. Continue current meds for now given reasonable control. Day too early for a1c today- will see if we can add this to cancer center labs- if not- asked him to stop back by for this in next month.   Hypertension S: controlled on amlodipine 5 mg BP Readings from Last 3 Encounters:  06/04/18 108/62  06/03/18 111/76  05/07/18 139/82  A/P: We discussed blood pressure goal of <140/90. Continue current meds  Gout S: 0 flares in last 3 months on allopurinol 100mg  Lab Results  Component Value Date   LABURIC 4.8 03/04/2018  A/P: continue current medication   Future Appointments  Date Time Provider [place]  06/06/2018  2:30 PM Collene Gobble, MD [facility] None  07/10/2018 10:30 AM [facility] 3 [facility] None  07/10/2018 11:00 AM Curt Bears, MD [facility] None  08/27/2018  3:15 PM Landis Martins, DPM [facility]   Return in about 4 months (around 10/05/2018) for follow up- or sooner if needed.  Lab/Order associations: Diabetes mellitus without complication ([place]) - Plan: Hemoglobin A1c  Essential hypertension  Idiopathic chronic gout of multiple sites without tophus  Meds ordered this encounter  Medications  . pantoprazole (PROTONIX) 40 MG tablet    Sig: Take 1 tablet (40 mg total) by mouth daily.    Dispense:  90 tablet    Refill:  3  . metFORMIN (GLUCOPHAGE) 500 MG tablet    Sig: Take 1 tablet (500 mg total) by mouth 2 (two) times daily with a meal.    Dispense:  180 tablet    Refill:  3  . amLODipine (NORVASC) 5 MG tablet    Sig: Take 1 tablet (5 mg total) by mouth daily.    Dispense:  90 tablet    Refill:  3  . allopurinol (ZYLOPRIM) 100 MG tablet    Sig: Take 1 tablet (100 mg total) by mouth daily.    Dispense:  90  tablet    Refill:  3  . glimepiride (AMARYL) 2 MG tablet    Sig: TAKE 1 TABLET ONCE DAILY BEFORE BREAKFAST.    Dispense:  90 tablet    Refill:  3  . glucose blood test strip    Sig: Use to test blood sugar twice a day    Dispense:  200 each    Refill:  3    Freestyle Test Strips E11.9  . diclofenac sodium (VOLTAREN) 1 % GEL    Sig: APPLY 2  GRAMS TOPICALLY FOUR TIMES A DAY AS NEEDED FOR LEFT SHOULDER ARTHRITIS    Dispense:  300 g    Refill:  3    Return precautions advised.  [name], MD

## 2018-06-04 NOTE — Assessment & Plan Note (Signed)
S:  controlled on metformin 500mg  BID and amaryl 2mg . Have to watch GFR with last GFR in mid 30s- noted yesterday- he was to push fluid intake Lab Results  Component Value Date   HGBA1C 7.8 (A) 03/04/2018   HGBA1C 7.8 11/26/2017   HGBA1C 7.2 07/05/2017   A/P: have to watch GFR carefully- may have to reduce/stop metformin. Continue current meds for now given reasonable control. Day too early for a1c today- will see if we can add this to cancer center labs- if not- asked him to stop back by for this in next month.

## 2018-06-04 NOTE — Addendum Note (Signed)
Addended by: Lyndle Herrlich on: 06/04/2018 04:35 PM   Modules accepted: Orders

## 2018-06-06 ENCOUNTER — Ambulatory Visit (INDEPENDENT_AMBULATORY_CARE_PROVIDER_SITE_OTHER): Payer: Medicare Other | Admitting: Emergency Medicine

## 2018-06-06 ENCOUNTER — Encounter: Payer: Self-pay | Admitting: Emergency Medicine

## 2018-06-06 DIAGNOSIS — C3491 Malignant neoplasm of unspecified part of right bronchus or lung: Secondary | ICD-10-CM

## 2018-06-06 DIAGNOSIS — J449 Chronic obstructive pulmonary disease, unspecified: Secondary | ICD-10-CM | POA: Diagnosis not present

## 2018-06-06 NOTE — Assessment & Plan Note (Signed)
Please keep your albuterol available to use 2 puffs if you needed for shortness of breath, chest tightness, wheezing. Flu shot is up-to-date. Pneumonia vaccine up-to-date

## 2018-06-06 NOTE — Assessment & Plan Note (Signed)
Please continue to follow-up with Dr. [name], CT scans of your chest per his plans.

## 2018-06-06 NOTE — Progress Notes (Signed)
   Subjective:    Patient ID: Richard Navarro, male    DOB: May 01, 1946, 72 y.o.   MRN: [id number]  HPI 72 year old former smoker (57 pack years) with a history of stage IV neuroendocrine non-small cell lung cancer and followed in this office for mild COPD.  He also has a history of hypertension, diabetes, chronic kidney disease, arthritis, GERD.  He has been followed by Dr. Ashok Cordia, Dr [name]. He is on daily Afinitor - has made his DM and edema worse. He states that he is doing well - not currently on BD's. Was formerly on O2 after a PNA in 12/18, but no longer uses. He has some exertional SOB with heavy exertion. Minimal cough, no CP.   ROV 06/06/18 --Richard Navarro is 72, a former smoker (70 pack years) with a history of stage IV non-small cell (neuroendocrine) lung cancer.  He also has mild COPD, hypertension, diabetes, chronic kidney disease, GERD.  His most recent CT scan of the chest was done on 04/26/2018 and I have reviewed.  This shows a slight decrease in size of a central right lower lobe mass with endobronchial component, slight improvement in postobstructive changes either atelectasis or pneumonia. He is having some problems with some labile voice, can lose his voice. Minimal allergy symptoms. GERD well controlled currently on his meds. Rare albuterol use, sometimes on hot days. It does seem to help some. He had ablation of his hepatic lesion since last time - went well. Remains on Affinitor with Dr [name].    Review of Systems       Objective:   Physical Exam   Vitals:   06/06/18 1428  BP: 110/60  Pulse: 76  SpO2: 97%  Weight: 79.8 kg  Height: 6\' 3"  (1.905 m)   Gen: Pleasant, well-nourished, in no distress,  normal affect  ENT: No lesions,  mouth clear,  oropharynx clear, no postnasal drip  Neck: No JVD, no stridor  Lungs: No use of accessory muscles, no crackles or wheezes.   Cardiovascular: RRR, heart sounds normal, no murmur or gallops, no peripheral  edema  Musculoskeletal: No deformities, no cyanosis or clubbing  Neuro: alert, non focal  Skin: Warm, no lesions or rash     Assessment & Plan:  Primary cancer of right lung ([place]) Please continue to follow-up with Dr. [name], CT scans of your chest per his plans.   COPD (chronic obstructive pulmonary disease) ([place]) Please keep your albuterol available to use 2 puffs if you needed for shortness of breath, chest tightness, wheezing. Flu shot is up-to-date. Pneumonia vaccine up-to-date  [name], MD, PhD 06/06/2018, 2:55 PM [facility] [phone] or if no answer [phone]

## 2018-06-06 NOTE — Patient Instructions (Addendum)
Please keep your albuterol available to use 2 puffs if you needed for shortness of breath, chest tightness, wheezing. Flu shot is up-to-date. Pneumonia vaccine up-to-date Please continue to follow-up with Dr. [name], CT scans of your chest per his plans. Follow with Dr Lamonte Sakai in 12 months or sooner if you have any problems

## 2018-06-13 ENCOUNTER — Other Ambulatory Visit (HOSPITAL_COMMUNITY): Payer: Self-pay | Admitting: Interventional Radiology

## 2018-06-13 ENCOUNTER — Other Ambulatory Visit: Payer: Self-pay | Admitting: Radiology

## 2018-06-13 ENCOUNTER — Encounter: Payer: Self-pay | Admitting: Radiology

## 2018-06-13 DIAGNOSIS — C7A8 Other malignant neuroendocrine tumors: Secondary | ICD-10-CM

## 2018-06-13 DIAGNOSIS — C7B8 Other secondary neuroendocrine tumors: Principal | ICD-10-CM

## 2018-06-25 DIAGNOSIS — H00024 Hordeolum internum left upper eyelid: Secondary | ICD-10-CM | POA: Diagnosis not present

## 2018-06-27 ENCOUNTER — Inpatient Hospital Stay: Payer: Medicare Other | Attending: Internal Medicine

## 2018-06-27 ENCOUNTER — Other Ambulatory Visit: Payer: Self-pay | Admitting: Medical Oncology

## 2018-06-27 DIAGNOSIS — I10 Essential (primary) hypertension: Secondary | ICD-10-CM | POA: Diagnosis not present

## 2018-06-27 DIAGNOSIS — C7B8 Other secondary neuroendocrine tumors: Secondary | ICD-10-CM | POA: Diagnosis not present

## 2018-06-27 DIAGNOSIS — D649 Anemia, unspecified: Secondary | ICD-10-CM | POA: Diagnosis not present

## 2018-06-27 DIAGNOSIS — C7A8 Other malignant neuroendocrine tumors: Secondary | ICD-10-CM

## 2018-06-27 LAB — CMP (CANCER CENTER ONLY)
ALBUMIN: 3.4 g/dL — AB (ref 3.5–5.0)
ALT: 14 U/L (ref 0–44)
ANION GAP: 9 (ref 5–15)
AST: 17 U/L (ref 15–41)
Alkaline Phosphatase: 88 U/L (ref 38–126)
BUN: 25 mg/dL — ABNORMAL HIGH (ref 8–23)
CO2: 24 mmol/L (ref 22–32)
Calcium: 8.9 mg/dL (ref 8.9–10.3)
Chloride: 104 mmol/L (ref 98–111)
Creatinine: 1.82 mg/dL — ABNORMAL HIGH (ref 0.61–1.24)
GFR, Est AFR Am: 40 mL/min — ABNORMAL LOW (ref >60)
GFR, Estimated: 35 mL/min — ABNORMAL LOW (ref >60)
GLUCOSE: 194 mg/dL — AB (ref 70–99)
POTASSIUM: 4.1 mmol/L (ref 3.5–5.1)
SODIUM: 137 mmol/L (ref 135–145)
Total Bilirubin: 0.3 mg/dL (ref 0.3–1.2)
Total Protein: 7.6 g/dL (ref 6.5–8.1)

## 2018-06-27 LAB — CBC WITH DIFFERENTIAL (CANCER CENTER ONLY)
ABS IMMATURE GRANULOCYTES: 0.01 10*3/uL (ref 0.00–0.07)
BASOS ABS: 0 10*3/uL (ref 0.0–0.1)
BASOS PCT: 1 %
Eosinophils Absolute: 0 10*3/uL (ref 0.0–0.5)
Eosinophils Relative: 1 %
HEMATOCRIT: 31.5 % — AB (ref 39.0–52.0)
Hemoglobin: 10.3 g/dL — ABNORMAL LOW (ref 13.0–17.0)
IMMATURE GRANULOCYTES: 0 %
LYMPHS ABS: 1.3 10*3/uL (ref 0.7–4.0)
Lymphocytes Relative: 30 %
MCH: 25.7 pg — ABNORMAL LOW (ref 26.0–34.0)
MCHC: 32.7 g/dL (ref 30.0–36.0)
MCV: 78.6 fL — AB (ref 80.0–100.0)
MONOS PCT: 9 %
Monocytes Absolute: 0.4 10*3/uL (ref 0.1–1.0)
NEUTROS ABS: 2.5 10*3/uL (ref 1.7–7.7)
NEUTROS PCT: 59 %
PLATELETS: 173 10*3/uL (ref 150–400)
RBC: 4.01 MIL/uL — ABNORMAL LOW (ref 4.22–5.81)
RDW: 14.9 % (ref 11.5–15.5)
WBC Count: 4.2 10*3/uL (ref 4.0–10.5)
nRBC: 0 % (ref 0.0–0.2)

## 2018-06-27 LAB — PROTIME-INR
INR: 0.91
PROTHROMBIN TIME: 12.2 s (ref 11.4–15.2)

## 2018-07-04 ENCOUNTER — Ambulatory Visit (HOSPITAL_COMMUNITY)
Admission: RE | Admit: 2018-07-04 | Discharge: 2018-07-04 | Disposition: A | Discharge status: Home / Self Care | Payer: Medicare Other | Source: Ambulatory Visit | Attending: Interventional Radiology | Admitting: Interventional Radiology

## 2018-07-04 ENCOUNTER — Other Ambulatory Visit: Payer: Medicare Other

## 2018-07-04 ENCOUNTER — Encounter: Payer: Self-pay | Admitting: Radiology

## 2018-07-04 ENCOUNTER — Ambulatory Visit
Admission: RE | Admit: 2018-07-04 | Discharge: 2018-07-04 | Disposition: A | Discharge status: Home / Self Care | Payer: Medicare Other | Source: Ambulatory Visit | Attending: Interventional Radiology | Admitting: Interventional Radiology

## 2018-07-04 DIAGNOSIS — R918 Other nonspecific abnormal finding of lung field: Secondary | ICD-10-CM | POA: Diagnosis not present

## 2018-07-04 DIAGNOSIS — C787 Secondary malignant neoplasm of liver and intrahepatic bile duct: Secondary | ICD-10-CM | POA: Diagnosis not present

## 2018-07-04 DIAGNOSIS — C7B8 Other secondary neuroendocrine tumors: Secondary | ICD-10-CM | POA: Diagnosis not present

## 2018-07-04 DIAGNOSIS — C7A1 Malignant poorly differentiated neuroendocrine tumors: Secondary | ICD-10-CM | POA: Diagnosis not present

## 2018-07-04 DIAGNOSIS — C3490 Malignant neoplasm of unspecified part of unspecified bronchus or lung: Secondary | ICD-10-CM | POA: Diagnosis not present

## 2018-07-04 DIAGNOSIS — C7A8 Other malignant neuroendocrine tumors: Secondary | ICD-10-CM | POA: Diagnosis not present

## 2018-07-04 HISTORY — PX: IR RADIOLOGIST EVAL & MGMT: IMG5224

## 2018-07-04 MED ORDER — GADOBUTROL 1 MMOL/ML IV SOLN
8.0000 mL | Freq: Once | INTRAVENOUS | Status: AC | PRN
  Administered 2018-07-04: 8 mL via INTRAVENOUS
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
## 2018-07-10 ENCOUNTER — Telehealth: Payer: Self-pay

## 2018-07-10 ENCOUNTER — Inpatient Hospital Stay (HOSPITAL_BASED_OUTPATIENT_CLINIC_OR_DEPARTMENT_OTHER): Payer: Medicare Other | Admitting: Internal Medicine

## 2018-07-10 ENCOUNTER — Inpatient Hospital Stay: Payer: Medicare Other

## 2018-07-10 ENCOUNTER — Other Ambulatory Visit: Payer: Self-pay | Admitting: *Deleted

## 2018-07-10 ENCOUNTER — Encounter: Payer: Self-pay | Admitting: Internal Medicine

## 2018-07-10 VITALS — BP 151/76 | HR 95 | Temp 98.3°F | Resp 17 | Ht 75.0 in | Wt 169.3 lb

## 2018-07-10 DIAGNOSIS — I10 Essential (primary) hypertension: Secondary | ICD-10-CM | POA: Diagnosis not present

## 2018-07-10 DIAGNOSIS — J449 Chronic obstructive pulmonary disease, unspecified: Secondary | ICD-10-CM

## 2018-07-10 DIAGNOSIS — D649 Anemia, unspecified: Secondary | ICD-10-CM | POA: Diagnosis not present

## 2018-07-10 DIAGNOSIS — C7B8 Other secondary neuroendocrine tumors: Secondary | ICD-10-CM

## 2018-07-10 DIAGNOSIS — C3491 Malignant neoplasm of unspecified part of right bronchus or lung: Secondary | ICD-10-CM

## 2018-07-10 DIAGNOSIS — Z5111 Encounter for antineoplastic chemotherapy: Secondary | ICD-10-CM

## 2018-07-10 DIAGNOSIS — C7A8 Other malignant neuroendocrine tumors: Secondary | ICD-10-CM | POA: Diagnosis not present

## 2018-07-10 LAB — CBC WITH DIFFERENTIAL (CANCER CENTER ONLY)
ABS IMMATURE GRANULOCYTES: 0.02 10*3/uL (ref 0.00–0.07)
BASOS ABS: 0 10*3/uL (ref 0.0–0.1)
Basophils Relative: 0 %
EOS PCT: 0 %
Eosinophils Absolute: 0 10*3/uL (ref 0.0–0.5)
HCT: 32.8 % — ABNORMAL LOW (ref 39.0–52.0)
HEMOGLOBIN: 10.3 g/dL — AB (ref 13.0–17.0)
Immature Granulocytes: 0 %
LYMPHS ABS: 1.2 10*3/uL (ref 0.7–4.0)
LYMPHS PCT: 26 %
MCH: 25.3 pg — ABNORMAL LOW (ref 26.0–34.0)
MCHC: 31.4 g/dL (ref 30.0–36.0)
MCV: 80.6 fL (ref 80.0–100.0)
Monocytes Absolute: 0.5 10*3/uL (ref 0.1–1.0)
Monocytes Relative: 10 %
NEUTROS ABS: 3 10*3/uL (ref 1.7–7.7)
NRBC: 0 % (ref 0.0–0.2)
Neutrophils Relative %: 64 %
PLATELETS: 181 10*3/uL (ref 150–400)
RBC: 4.07 MIL/uL — AB (ref 4.22–5.81)
RDW: 14.7 % (ref 11.5–15.5)
WBC: 4.7 10*3/uL (ref 4.0–10.5)

## 2018-07-10 LAB — CMP (CANCER CENTER ONLY)
ALT: 15 U/L (ref 0–44)
AST: 19 U/L (ref 15–41)
Albumin: 3.3 g/dL — ABNORMAL LOW (ref 3.5–5.0)
Alkaline Phosphatase: 75 U/L (ref 38–126)
Anion gap: 10 (ref 5–15)
BUN: 29 mg/dL — AB (ref 8–23)
CHLORIDE: 107 mmol/L (ref 98–111)
CO2: 23 mmol/L (ref 22–32)
CREATININE: 1.87 mg/dL — AB (ref 0.61–1.24)
Calcium: 8.8 mg/dL — ABNORMAL LOW (ref 8.9–10.3)
GFR, EST NON AFRICAN AMERICAN: 34 mL/min — AB (ref >60)
GFR, Est AFR Am: 39 mL/min — ABNORMAL LOW (ref >60)
Glucose, Bld: 146 mg/dL — ABNORMAL HIGH (ref 70–99)
Potassium: 3.8 mmol/L (ref 3.5–5.1)
Sodium: 140 mmol/L (ref 135–145)
Total Bilirubin: 0.3 mg/dL (ref 0.3–1.2)
Total Protein: 7.6 g/dL (ref 6.5–8.1)
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
## 2018-07-10 NOTE — Telephone Encounter (Signed)
Printed avs and calender of upcoming appointment. Per 11/20 los

## 2018-07-24 ENCOUNTER — Telehealth: Payer: Self-pay | Admitting: *Deleted

## 2018-07-24 ENCOUNTER — Ambulatory Visit (HOSPITAL_COMMUNITY)
Admission: RE | Admit: 2018-07-24 | Discharge: 2018-07-24 | Disposition: A | Discharge status: Home / Self Care | Payer: Medicare Other | Source: Ambulatory Visit | Attending: Medical | Admitting: Medical

## 2018-07-24 ENCOUNTER — Inpatient Hospital Stay: Payer: Medicare Other

## 2018-07-24 ENCOUNTER — Inpatient Hospital Stay: Payer: Medicare Other | Attending: Internal Medicine | Admitting: Medical

## 2018-07-24 DIAGNOSIS — J189 Pneumonia, unspecified organism: Secondary | ICD-10-CM | POA: Insufficient documentation

## 2018-07-24 DIAGNOSIS — J984 Other disorders of lung: Secondary | ICD-10-CM | POA: Diagnosis not present

## 2018-07-24 DIAGNOSIS — C3491 Malignant neoplasm of unspecified part of right bronchus or lung: Secondary | ICD-10-CM

## 2018-07-24 DIAGNOSIS — J90 Pleural effusion, not elsewhere classified: Secondary | ICD-10-CM | POA: Insufficient documentation

## 2018-07-24 DIAGNOSIS — C7A8 Other malignant neuroendocrine tumors: Secondary | ICD-10-CM | POA: Insufficient documentation

## 2018-07-24 DIAGNOSIS — R509 Fever, unspecified: Secondary | ICD-10-CM | POA: Insufficient documentation

## 2018-07-24 DIAGNOSIS — C7B8 Other secondary neuroendocrine tumors: Secondary | ICD-10-CM | POA: Diagnosis not present

## 2018-07-24 DIAGNOSIS — Z87891 Personal history of nicotine dependence: Secondary | ICD-10-CM | POA: Insufficient documentation

## 2018-07-24 LAB — CMP (CANCER CENTER ONLY)
ALK PHOS: 65 U/L (ref 38–126)
ALT: 19 U/L (ref 0–44)
ANION GAP: 12 (ref 5–15)
AST: 21 U/L (ref 15–41)
Albumin: 3 g/dL — ABNORMAL LOW (ref 3.5–5.0)
BILIRUBIN TOTAL: 0.4 mg/dL (ref 0.3–1.2)
BUN: 18 mg/dL (ref 8–23)
CO2: 22 mmol/L (ref 22–32)
CREATININE: 1.73 mg/dL — AB (ref 0.61–1.24)
Calcium: 8.8 mg/dL — ABNORMAL LOW (ref 8.9–10.3)
Chloride: 105 mmol/L (ref 98–111)
GFR, Est AFR Am: 44 mL/min — ABNORMAL LOW (ref >60)
GFR, Estimated: 38 mL/min — ABNORMAL LOW (ref >60)
GLUCOSE: 133 mg/dL — AB (ref 70–99)
Potassium: 3.6 mmol/L (ref 3.5–5.1)
SODIUM: 139 mmol/L (ref 135–145)
Total Protein: 7.5 g/dL (ref 6.5–8.1)

## 2018-07-24 LAB — CBC WITH DIFFERENTIAL (CANCER CENTER ONLY)
ABS IMMATURE GRANULOCYTES: 0.01 10*3/uL (ref 0.00–0.07)
Basophils Absolute: 0 10*3/uL (ref 0.0–0.1)
Basophils Relative: 0 %
EOS PCT: 0 %
Eosinophils Absolute: 0 10*3/uL (ref 0.0–0.5)
HEMATOCRIT: 27.5 % — AB (ref 39.0–52.0)
HEMOGLOBIN: 8.9 g/dL — AB (ref 13.0–17.0)
Immature Granulocytes: 0 %
LYMPHS ABS: 1.2 10*3/uL (ref 0.7–4.0)
LYMPHS PCT: 33 %
MCH: 25.1 pg — AB (ref 26.0–34.0)
MCHC: 32.4 g/dL (ref 30.0–36.0)
MCV: 77.7 fL — AB (ref 80.0–100.0)
MONO ABS: 0.4 10*3/uL (ref 0.1–1.0)
MONOS PCT: 11 %
NEUTROS ABS: 2.1 10*3/uL (ref 1.7–7.7)
Neutrophils Relative %: 56 %
Platelet Count: 184 10*3/uL (ref 150–400)
RBC: 3.54 MIL/uL — ABNORMAL LOW (ref 4.22–5.81)
RDW: 14.6 % (ref 11.5–15.5)
WBC Count: 3.7 10*3/uL — ABNORMAL LOW (ref 4.0–10.5)
nRBC: 0 % (ref 0.0–0.2)

## 2018-07-24 MED ORDER — AMOXICILLIN-POT CLAVULANATE 875-125 MG PO TABS: 1.0000 | ORAL_TABLET | Freq: Two times a day (BID) | ORAL | 0 refills | Status: DC
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
## 2018-07-24 NOTE — Progress Notes (Signed)
These results were called to [name] and were reviewed with him . His were answered. He expressed understanding. Dr. [name] recommended Levaquin. There was a warning regarding the use of Levaquin with the patient's diabetic medications (increased risk of hypoglycemia). He was given Augmentin twice daily for 7 days.

## 2018-07-24 NOTE — Telephone Encounter (Signed)
"  Richard Navarro and [name] called reporting this morning he's not feeling well.    Having hot flashes, feeling really tired.    T = 100.18F, P = 108, B/P = 153/77, O2 level = 89 to 90%.    Thirty minutes later T = 100.43F, oxygen the same.  Took two tylenol.    Now forty minutes later, T = 99.81F, oxygen = 92%.  I've been fine until this morning.  Not sure if I picked up anything; have not been around anyone sick.   Take the Afinitor 7.5 mg tablet daily.  No new medcines.   No trouble breathing or s.o.b.  No trouble urinating.  I think bowels moved yesterday or Monday; they don't move as much anymore.  I drink and eat but did not reach 64 oz yesterday.  No pain, trouble walking or any other changes or problems.  Don't know what's wrong." Verbal order received and read back from Dr. [name] for Symptom Management if warranted.  "This reminds [place] of symptoms he had last year, hospitalized being septic.  Will come now and arrive about 10:40 am."  Scheduling message sent.

## 2018-07-26 ENCOUNTER — Other Ambulatory Visit: Payer: Self-pay | Admitting: Medical

## 2018-07-26 ENCOUNTER — Inpatient Hospital Stay (HOSPITAL_BASED_OUTPATIENT_CLINIC_OR_DEPARTMENT_OTHER): Payer: Medicare Other | Admitting: Medical

## 2018-07-26 ENCOUNTER — Encounter: Payer: Self-pay | Admitting: Medical

## 2018-07-26 ENCOUNTER — Inpatient Hospital Stay: Payer: Medicare Other | Admitting: Medical

## 2018-07-26 ENCOUNTER — Telehealth: Payer: Self-pay | Admitting: *Deleted

## 2018-07-26 ENCOUNTER — Inpatient Hospital Stay: Payer: Medicare Other

## 2018-07-26 VITALS — BP 146/78 | HR 107 | Temp 98.4°F | Resp 18 | Ht 75.0 in | Wt 166.5 lb

## 2018-07-26 DIAGNOSIS — J189 Pneumonia, unspecified organism: Secondary | ICD-10-CM

## 2018-07-26 DIAGNOSIS — R0609 Other forms of dyspnea: Secondary | ICD-10-CM | POA: Diagnosis not present

## 2018-07-26 DIAGNOSIS — R509 Fever, unspecified: Secondary | ICD-10-CM

## 2018-07-26 DIAGNOSIS — C7A8 Other malignant neuroendocrine tumors: Secondary | ICD-10-CM

## 2018-07-26 DIAGNOSIS — C7B8 Other secondary neuroendocrine tumors: Secondary | ICD-10-CM | POA: Diagnosis not present

## 2018-07-26 DIAGNOSIS — J90 Pleural effusion, not elsewhere classified: Secondary | ICD-10-CM | POA: Diagnosis not present

## 2018-07-26 DIAGNOSIS — C3491 Malignant neoplasm of unspecified part of right bronchus or lung: Secondary | ICD-10-CM

## 2018-07-26 DIAGNOSIS — J984 Other disorders of lung: Secondary | ICD-10-CM | POA: Diagnosis not present

## 2018-07-26 LAB — CBC WITH DIFFERENTIAL (CANCER CENTER ONLY)
Abs Immature Granulocytes: 0.01 10*3/uL (ref 0.00–0.07)
BASOS ABS: 0 10*3/uL (ref 0.0–0.1)
Basophils Relative: 0 %
Eosinophils Absolute: 0 10*3/uL (ref 0.0–0.5)
Eosinophils Relative: 0 %
HCT: 28.7 % — ABNORMAL LOW (ref 39.0–52.0)
Hemoglobin: 9.1 g/dL — ABNORMAL LOW (ref 13.0–17.0)
Immature Granulocytes: 0 %
Lymphocytes Relative: 26 %
Lymphs Abs: 1.1 10*3/uL (ref 0.7–4.0)
MCH: 24.7 pg — ABNORMAL LOW (ref 26.0–34.0)
MCHC: 31.7 g/dL (ref 30.0–36.0)
MCV: 78 fL — ABNORMAL LOW (ref 80.0–100.0)
Monocytes Absolute: 0.3 10*3/uL (ref 0.1–1.0)
Monocytes Relative: 7 %
Neutro Abs: 2.7 10*3/uL (ref 1.7–7.7)
Neutrophils Relative %: 67 %
Platelet Count: 222 10*3/uL (ref 150–400)
RBC: 3.68 MIL/uL — ABNORMAL LOW (ref 4.22–5.81)
RDW: 14.3 % (ref 11.5–15.5)
WBC Count: 4.1 10*3/uL (ref 4.0–10.5)
nRBC: 0 % (ref 0.0–0.2)

## 2018-07-26 LAB — URINALYSIS, COMPLETE (UACMP) WITH MICROSCOPIC
BILIRUBIN URINE: NEGATIVE
Bacteria, UA: NONE SEEN
GLUCOSE, UA: NEGATIVE mg/dL
HGB URINE DIPSTICK: NEGATIVE
Ketones, ur: 5 mg/dL — AB
LEUKOCYTES UA: NEGATIVE
NITRITE: NEGATIVE
PH: 6 (ref 5.0–8.0)
Protein, ur: NEGATIVE mg/dL
Specific Gravity, Urine: 1.016 (ref 1.005–1.030)

## 2018-07-26 MED ORDER — LEVOFLOXACIN IN D5W 500 MG/100ML IV SOLN
500.0000 mg | Freq: Once | INTRAVENOUS | Status: AC
  Administered 2018-07-26: 500 mg via INTRAVENOUS
  Filled 2018-07-26: qty 100

## 2018-07-26 MED ORDER — LEVOFLOXACIN 500 MG PO TABS: 500.0000 mg | ORAL_TABLET | Freq: Every day | ORAL | 0 refills | Status: AC

## 2018-07-26 MED ORDER — SODIUM CHLORIDE 0.9 % IV SOLN
INTRAVENOUS | Status: DC
  Administered 2018-07-26: 15:00:00 via INTRAVENOUS
  Filled 2018-07-26: qty 250

## 2018-07-26 MED ORDER — LEVOFLOXACIN IN D5W 500 MG/100ML IV SOLN
500.0000 mg | Freq: Once | INTRAVENOUS | Status: DC
  Filled 2018-07-26: qty 100

## 2018-07-26 NOTE — Patient Instructions (Signed)
Levofloxacin injection What is this medicine? LEVOFLOXACIN (lee voe FLOX a sin) is a quinolone antibiotic. It is used to treat certain kinds of bacterial infections. It will not work for colds, flu, or other viral infections. This medicine may be used for other purposes; ask your health care provider or pharmacist if you have questions. COMMON BRAND NAME(S): Levaquin What should I tell my health care provider before I take this medicine? They need to know if you have any of these conditions: -bone problems -diabetes -history of low levels of potassium in the blood -irregular heartbeat -joint problems -kidney disease -liver disease -myasthenia gravis -seizures -tendon problems -tingling of the fingers or toes, or other nerve disorder -an unusual or allergic reaction to levofloxacin, other quinolone antibiotics, foods, dyes, or preservatives -pregnant or trying to get pregnant -breast-feeding How should I use this medicine? This medicine is for infusion into a vein. It is usually given by a health care professional in a hospital or clinic setting. If you get this medicine at home, you will be taught how to prepare and give this medicine. Use exactly as directed. Take your medicine at regular intervals. Do not take your medicine more often than directed. It is important that you put your used needles and syringes in a special sharps container. Do not put them in a trash can. If you do not have a sharps container, call your pharmacist or healthcare provider to get one. A special MedGuide will be given to you by the pharmacist with each prescription and refill. Be sure to read this information carefully each time. Talk to your pediatrician regarding the use of this medicine in children. While this drug may be prescribed for children as young as 6 months for selected conditions, precautions do apply. Overdosage: If you think you have taken too much of this medicine contact a poison control  center or emergency room at once. NOTE: This medicine is only for you. Do not share this medicine with others. What if I miss a dose? If you miss a dose, use it as soon as you can. If it is almost time for your next dose, use only that dose. Do not use double or extra doses. What may interact with this medicine? Do not take this medicine with any of the following medications: -bepridil -certain medicines for depression, anxiety, or psychotic disturbances like pimozide, thioridazine, and ziprasidone -certain medicines for irregular heart beat like dofetilide and dronedarone -cisapride -halofantrine This medicine may also interact with the following medications: -birth control pills -certain medicines for diabetes, like glipizide, glyburide, or insulin -NSAIDS, medicines for pain and inflammation, like ibuprofen or naproxen -steroid medicines like prednisone or cortisone -theophylline -warfarin This list may not describe all possible interactions. Give your health care provider a list of all the medicines, herbs, non-prescription drugs, or dietary supplements you use. Also tell them if you smoke, drink alcohol, or use illegal drugs. Some items may interact with your medicine. What should I watch for while using this medicine? Tell your doctor or healthcare professional if your symptoms do not start to get better or if they get worse. Do not treat diarrhea with over the counter products. Contact your doctor if you have diarrhea that lasts more than 2 days or if it is severe and watery. Check with your doctor or health care professional if you get an attack of severe diarrhea, nausea and vomiting, or if you sweat a lot. The loss of too much body fluid can make it dangerous  for you to take this medicine. You may get drowsy or dizzy. Do not drive, use machinery, or do anything that needs mental alertness until you know how this medicine affects you. Do not sit or stand up quickly, especially if you  are an older patient. This reduces the risk of dizzy or fainting spells. This medicine can make you more sensitive to the sun. Keep out of the sun. If you cannot avoid being in the sun, wear protective clothing and use a sunscreen. Do not use sun lamps or tanning beds/booths. Contact your doctor if you get a sunburn. If you are a diabetic monitor your blood glucose carefully. If you get an unusual reading stop taking this medicine and call your doctor right away. What side effects may I notice from receiving this medicine? Side effects that you should report to your doctor or health care professional as soon as possible: -allergic reactions like skin rash or hives, swelling of the face, lips, or tongue -anxious -breathing problems -confusion -depressed mood -diarrhea -dizziness -fast, irregular heartbeat -hallucination, loss of contact with reality -joint, muscle, or tendon pain or swelling -muscle weakness -pain, tingling, numbness in the hands or feet -seizures -signs and symptoms of high blood sugar such as dizziness; dry mouth; dry skin; fruity breath; nausea; stomach pain; increased hunger or thirst; increased urination -signs and symptoms of liver injury like dark yellow or brown urine; general ill feeling or flu-like symptoms; light-colored stools; loss of appetite; nausea; right upper belly pain; unusually weak or tired; yellowing of the eyes or skin -signs and symptoms of low blood sugar such as feeling anxious; confusion; dizziness; increased hunger; unusually weak or tired; sweating; shakiness; cold; irritable; headache; blurred vision; fast heartbeat; loss of consciousness -suicidal thoughts or other mood changes -sunburn -unusually weak or tired Side effects that usually do not require medical attention (report to your doctor or health care professional if they continue or are bothersome): -constipation -dry mouth -headache -nausea, vomiting -pain, irritation at the site of  injection -trouble sleeping This list may not describe all possible side effects. Call your doctor for medical advice about side effects. You may report side effects to FDA at 1-800-FDA-1088. Where should I keep my medicine? Keep out of the reach of children. If you are using this medicine at home, you will be instructed on how to store this medicine. Throw away any unused medicine after the expiration date on the label. NOTE: This sheet is a summary. It may not cover all possible information. If you have questions about this medicine, talk to your doctor, pharmacist, or health care provider.  2018 Elsevier/Gold Standard (2016-02-15 12:36:58)

## 2018-07-26 NOTE — Telephone Encounter (Signed)
Received TCT from pt's spouse.  She states that pt saw Sandi Mealy in [facility] on Wednesday, was diagnosed with pneumonia and started on antibiotics-augmentin. She states today her husband is not feeling any better. That he feels worse.   Temp-100.2; S02 97%; pulse 113-119; and BP 133/80.  She states he c/o labored breathing and that his head feels 'heavy'. Spoke with Sandi Mealy and he would like pt to come back to [facility], get s labs checked again, along with blood cultures and receive a dose of IV antibiotics. Explained the above to pt's wife and they both agreed to come in around 1:30 pm. High priority scheduling message sent. Lab orders put in per Sandi Mealy, PA

## 2018-07-27 LAB — URINE CULTURE: Culture: NO GROWTH
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
## 2018-07-31 LAB — CULTURE, BLOOD (SINGLE)
CULTURE: NO GROWTH
Culture: NO GROWTH
Special Requests: ADEQUATE
Special Requests: ADEQUATE

## 2018-07-31 NOTE — Progress Notes (Signed)
These preliminary result these preliminary results were noted.  Awaiting final report.

## 2018-08-07 ENCOUNTER — Inpatient Hospital Stay: Payer: Medicare Other

## 2018-08-07 ENCOUNTER — Telehealth: Payer: Self-pay | Admitting: Internal Medicine

## 2018-08-07 ENCOUNTER — Inpatient Hospital Stay (HOSPITAL_BASED_OUTPATIENT_CLINIC_OR_DEPARTMENT_OTHER): Payer: Medicare Other | Admitting: Oncology

## 2018-08-07 VITALS — BP 139/66 | HR 116 | Temp 98.0°F | Resp 20 | Ht 75.0 in | Wt 162.7 lb

## 2018-08-07 DIAGNOSIS — R634 Abnormal weight loss: Secondary | ICD-10-CM | POA: Diagnosis not present

## 2018-08-07 DIAGNOSIS — R0609 Other forms of dyspnea: Secondary | ICD-10-CM

## 2018-08-07 DIAGNOSIS — R000 Tachycardia, unspecified: Secondary | ICD-10-CM | POA: Diagnosis not present

## 2018-08-07 DIAGNOSIS — J189 Pneumonia, unspecified organism: Secondary | ICD-10-CM | POA: Diagnosis not present

## 2018-08-07 DIAGNOSIS — D649 Anemia, unspecified: Secondary | ICD-10-CM

## 2018-08-07 DIAGNOSIS — R509 Fever, unspecified: Secondary | ICD-10-CM | POA: Diagnosis not present

## 2018-08-07 DIAGNOSIS — J984 Other disorders of lung: Secondary | ICD-10-CM | POA: Diagnosis not present

## 2018-08-07 DIAGNOSIS — C7A8 Other malignant neuroendocrine tumors: Secondary | ICD-10-CM | POA: Diagnosis not present

## 2018-08-07 DIAGNOSIS — C7B8 Other secondary neuroendocrine tumors: Secondary | ICD-10-CM | POA: Diagnosis not present

## 2018-08-07 DIAGNOSIS — J90 Pleural effusion, not elsewhere classified: Secondary | ICD-10-CM | POA: Diagnosis not present

## 2018-08-07 DIAGNOSIS — E46 Unspecified protein-calorie malnutrition: Secondary | ICD-10-CM

## 2018-08-07 DIAGNOSIS — Z5111 Encounter for antineoplastic chemotherapy: Secondary | ICD-10-CM

## 2018-08-07 LAB — CMP (CANCER CENTER ONLY)
ALT: 31 U/L (ref 0–44)
AST: 28 U/L (ref 15–41)
Albumin: 3.1 g/dL — ABNORMAL LOW (ref 3.5–5.0)
Alkaline Phosphatase: 79 U/L (ref 38–126)
Anion gap: 14 (ref 5–15)
BILIRUBIN TOTAL: 0.4 mg/dL (ref 0.3–1.2)
BUN: 24 mg/dL — ABNORMAL HIGH (ref 8–23)
CO2: 20 mmol/L — ABNORMAL LOW (ref 22–32)
CREATININE: 1.95 mg/dL — AB (ref 0.61–1.24)
Calcium: 9.1 mg/dL (ref 8.9–10.3)
Chloride: 101 mmol/L (ref 98–111)
GFR, Est AFR Am: 38 mL/min — ABNORMAL LOW (ref >60)
GFR, Estimated: 33 mL/min — ABNORMAL LOW (ref >60)
Glucose, Bld: 307 mg/dL — ABNORMAL HIGH (ref 70–99)
Potassium: 3.5 mmol/L (ref 3.5–5.1)
Sodium: 135 mmol/L (ref 135–145)
Total Protein: 8 g/dL (ref 6.5–8.1)

## 2018-08-07 LAB — CBC WITH DIFFERENTIAL (CANCER CENTER ONLY)
ABS IMMATURE GRANULOCYTES: 0.03 10*3/uL (ref 0.00–0.07)
Basophils Absolute: 0 10*3/uL (ref 0.0–0.1)
Basophils Relative: 0 %
Eosinophils Absolute: 0 10*3/uL (ref 0.0–0.5)
Eosinophils Relative: 0 %
HEMATOCRIT: 29.7 % — AB (ref 39.0–52.0)
Hemoglobin: 9.4 g/dL — ABNORMAL LOW (ref 13.0–17.0)
Immature Granulocytes: 0 %
LYMPHS ABS: 1.8 10*3/uL (ref 0.7–4.0)
Lymphocytes Relative: 24 %
MCH: 24.9 pg — ABNORMAL LOW (ref 26.0–34.0)
MCHC: 31.6 g/dL (ref 30.0–36.0)
MCV: 78.8 fL — ABNORMAL LOW (ref 80.0–100.0)
Monocytes Absolute: 0.4 10*3/uL (ref 0.1–1.0)
Monocytes Relative: 6 %
NEUTROS ABS: 4.9 10*3/uL (ref 1.7–7.7)
Neutrophils Relative %: 70 %
Platelet Count: 291 10*3/uL (ref 150–400)
RBC: 3.77 MIL/uL — ABNORMAL LOW (ref 4.22–5.81)
RDW: 15 % (ref 11.5–15.5)
WBC Count: 7.2 10*3/uL (ref 4.0–10.5)
nRBC: 0 % (ref 0.0–0.2)

## 2018-08-07 MED ORDER — LEVOFLOXACIN 500 MG PO TABS: 500.0000 mg | ORAL_TABLET | Freq: Every day | ORAL | 0 refills | Status: DC

## 2018-08-07 MED ORDER — METHYLPREDNISOLONE 4 MG PO TABS: ORAL_TABLET | ORAL | 0 refills | Status: DC

## 2018-08-07 NOTE — Telephone Encounter (Signed)
Gave patient avs report and appointments for January  °

## 2018-08-07 NOTE — Assessment & Plan Note (Addendum)
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 The patient was started on treatment with Afinitor 10 mg by mouth daily status post 2 months of treatment. This was followed by reduction of his dose to 7.5 mg by mouth daily status post 16 months and he is tolerating this dose much better.  He also underwent radio embolization of metastatic lesion in the liver by interventional radiology on Jan 09, 2018. The patient continues to tolerate this treatment well with no concerning adverse effects.  Recommend for him to continue his current dose of Afinitor.  The patient was recently diagnosed with pneumonia and treated with oral antibiotics.  He is still not fully recovered.  He is still having low-grade fevers, cough, and shortness of breath.  I have given him 7 additional days of levofloxacin 500 mg daily.  He also has faint wheezing and has lost weight.  I have given him Medrol Dosepak.  The patient is tachycardic and likely has a component of dehydration.  He was encouraged to increase his oral hydration.  For the anemia, he will continue on the oral iron tablets for now.  The patient will follow-up in 1 month for evaluation and repeat lab work.  He was advised to call immediately if he has any concerning symptoms in the interval. The patient voices understanding of current disease status and treatment options and is in agreement with the current care plan. All questions were answered. The patient knows to call the clinic with any problems, questions or concerns. We can certainly see the patient much sooner if necessary.
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
## 2018-08-12 ENCOUNTER — Ambulatory Visit: Payer: Medicare Other | Admitting: Internal Medicine

## 2018-08-12 ENCOUNTER — Other Ambulatory Visit: Payer: Medicare Other

## 2018-08-12 IMAGING — CT CT ANGIO CHEST
2 of 7 series · 18 of 46 positions shown · IV contrast (ISOVUE)
Comparison: CT scans dated 12/05/2017 and 08/31/2017

CLINICAL DATA: Productive cough. Sore throat and fever. Metastatic
lung cancer.

EXAM:
CT ANGIOGRAPHY CHEST WITH CONTRAST
TECHNIQUE: Multidetector CT imaging of the chest was performed using the
standard protocol during bolus administration of intravenous
contrast. Multiplanar CT image reconstructions and MIPs were
obtained to evaluate the vascular anatomy.
CONTRAST:  100mL 31RIQD-7TD IOPAMIDOL (31RIQD-7TD) INJECTION 76%

[Series 6: thins · axial · 0.76mm/px · z∈[+1435,+1692]mm · 15 of 293 slices shown]
[im 18/293  lung]
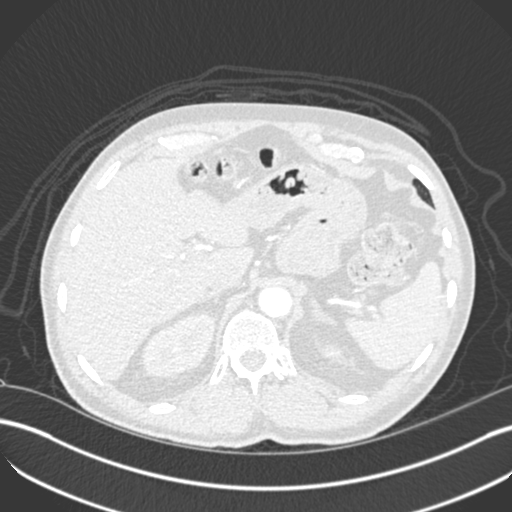
[im 35/293  soft-tissue]
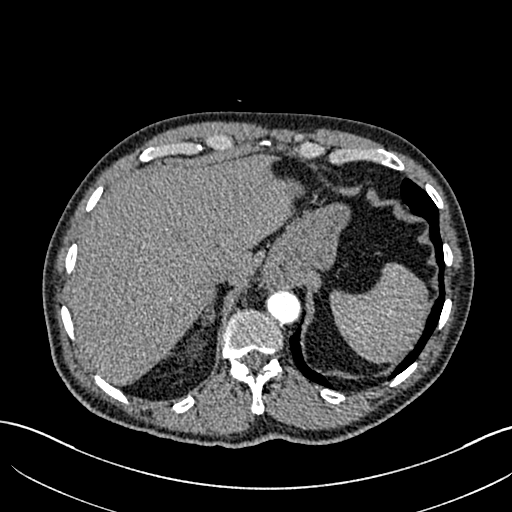
[im 52/293  lung]
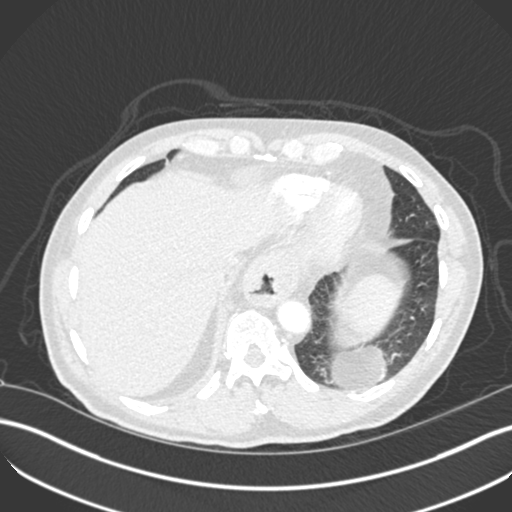
[im 69/293  soft-tissue]
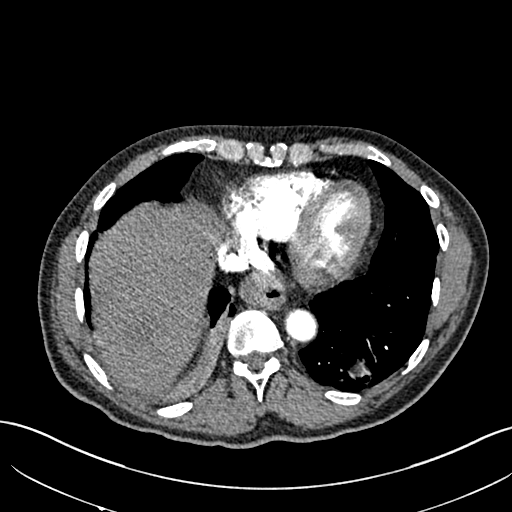
[im 86/293  lung]
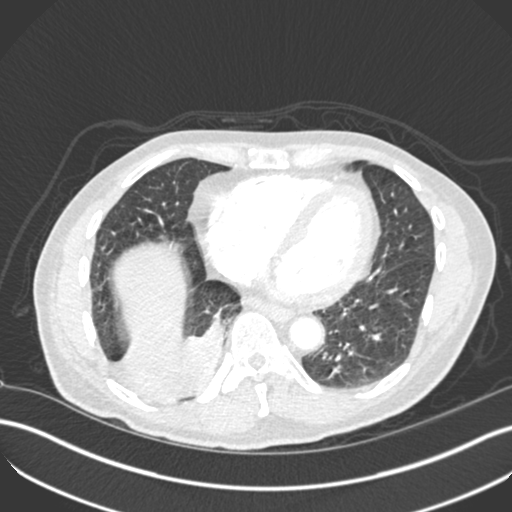
[im 104/293  soft-tissue]
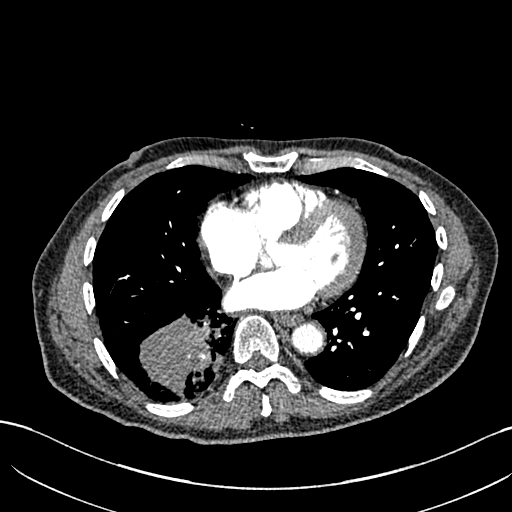
[im 121/293  lung]
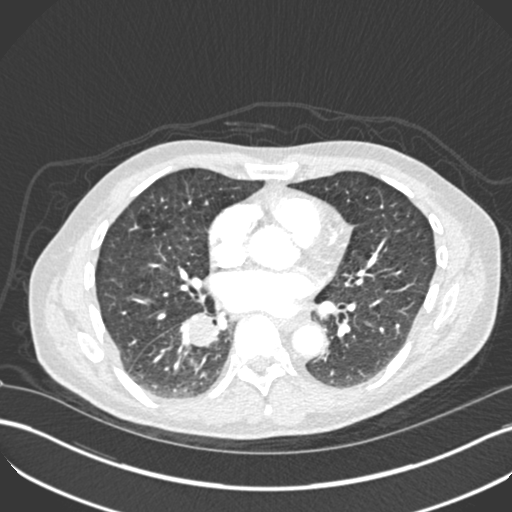
[im 155/293  soft-tissue]
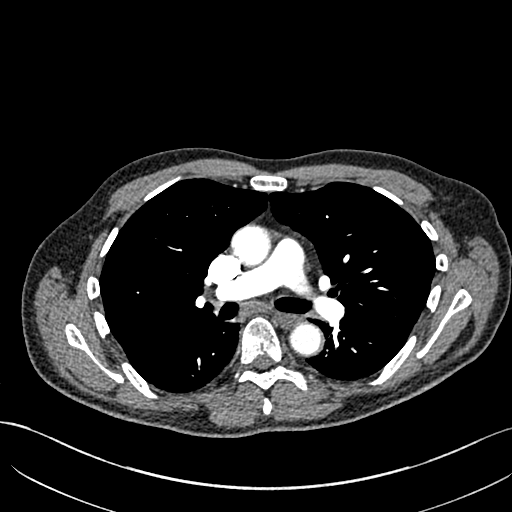
[im 172/293  lung]
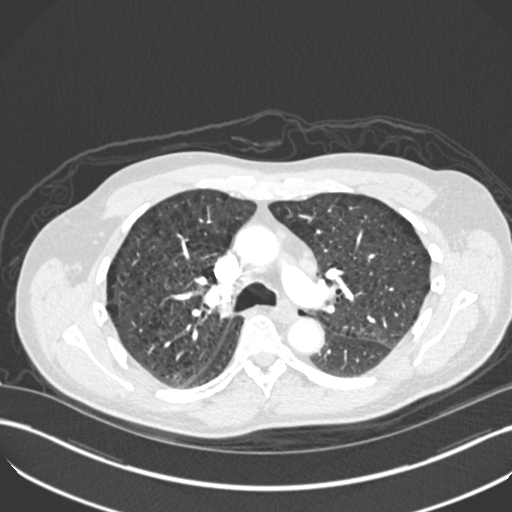
[im 189/293  soft-tissue]
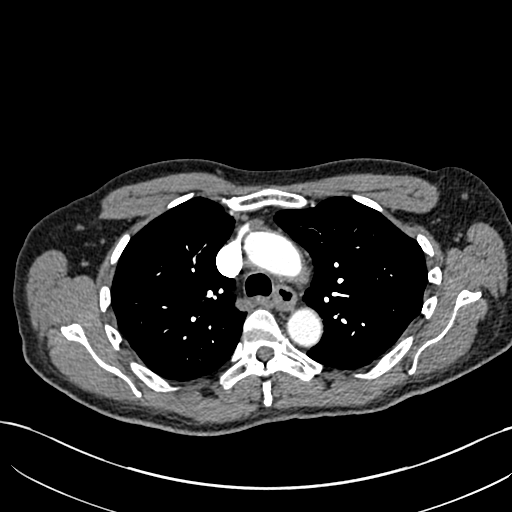
[im 207/293  lung]
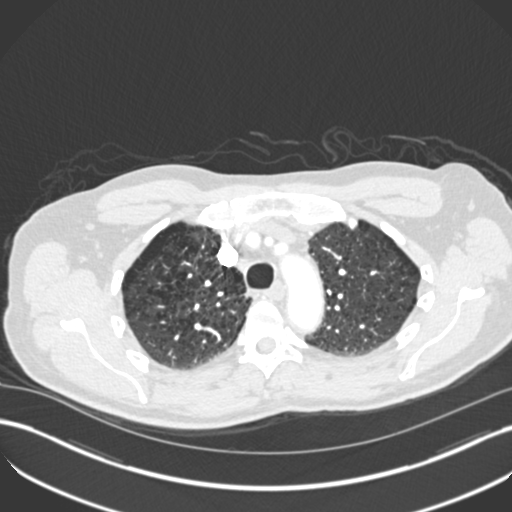
[im 224/293  soft-tissue]
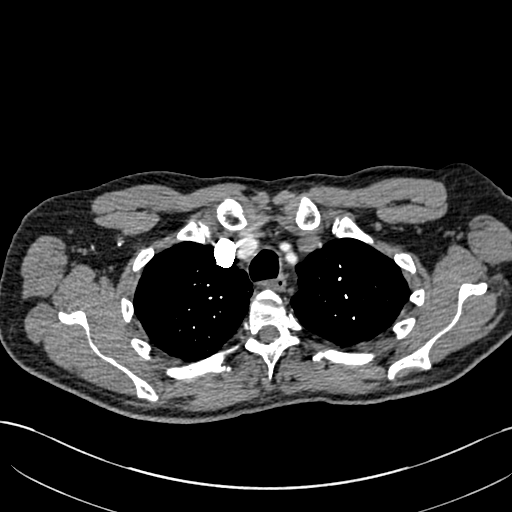
[im 241/293  lung]
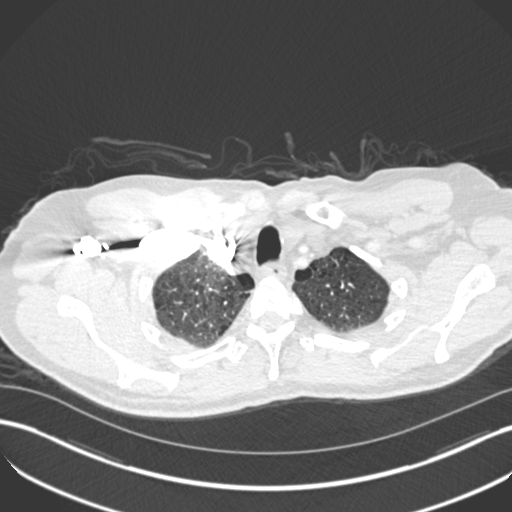
[im 258/293  soft-tissue]
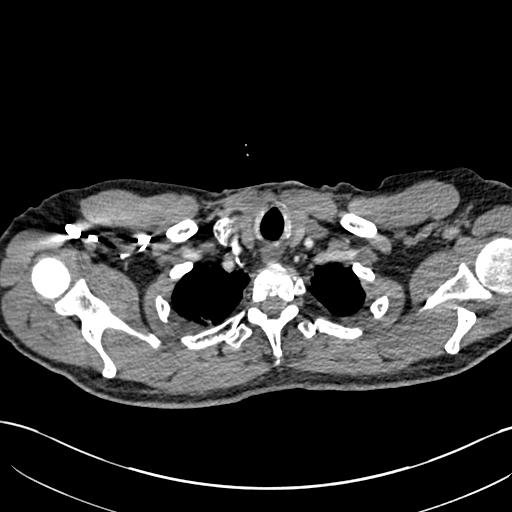
[im 275/293  lung]
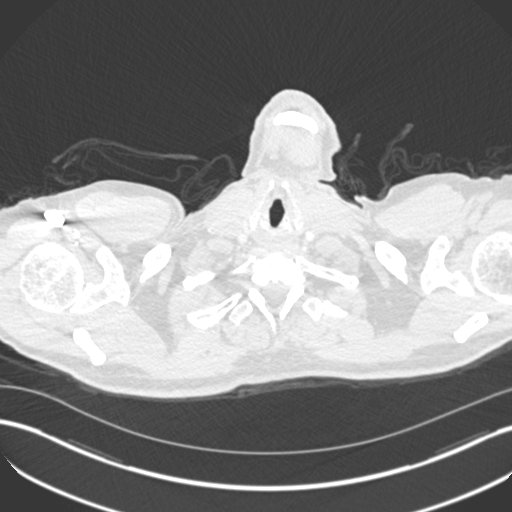

[Series 8: coronal mpr · coronal · 0.59mm/px · 3 of 150 slices shown]
[im 38/150  soft-tissue]
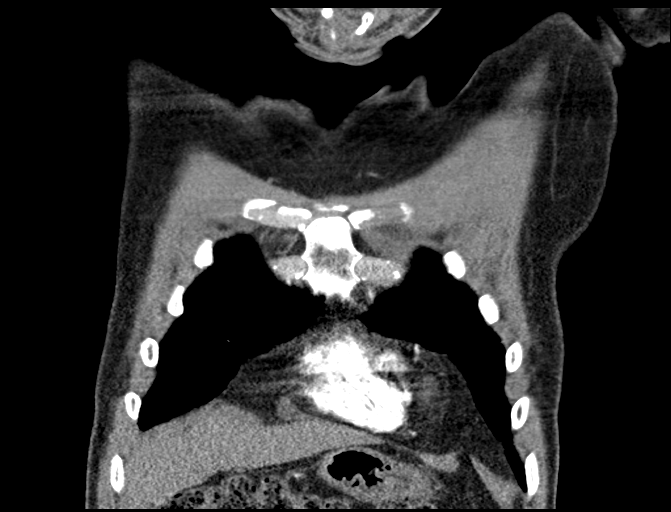
[im 75/150  soft-tissue]
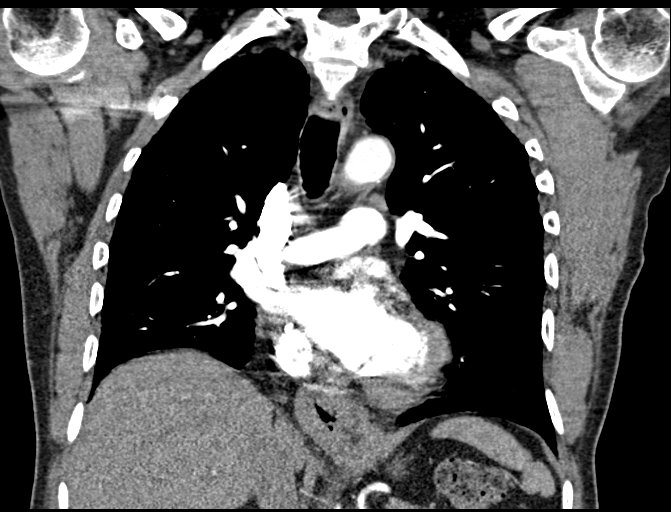
[im 112/150  soft-tissue]
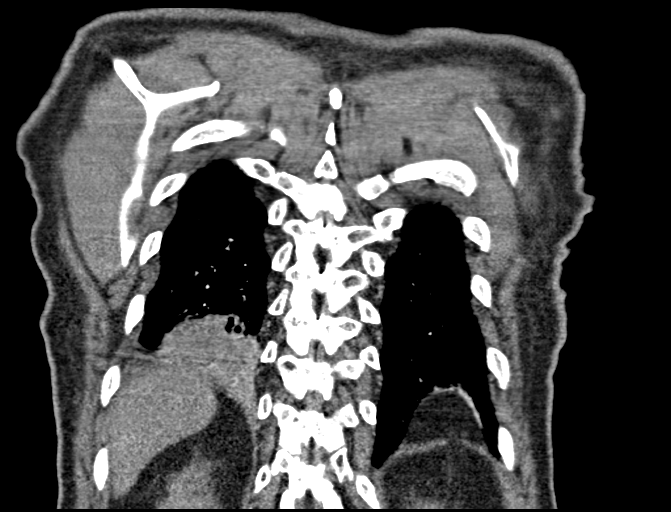

[18 of 46 positions shown; findings below may reference images not displayed]

FINDINGS: Cardiovascular: Satisfactory opacification of the pulmonary arteries
to the segmental level. No evidence of pulmonary embolism. Normal
heart size. No pericardial effusion.

Mediastinum/Nodes: There are several slightly prominent lymph nodes,
unchanged since the study 5 days ago. Moderate hiatal hernia,
unchanged.

Lungs/Pleura: Progressive consolidation in the right lower lobe
distal to the mass obstructing the right lower lobe bronchi. Chronic
small diaphragmatic hernia posteriorly on the left containing only
fat.

Extensive emphysematous changes are stable.  No effusions.

Upper Abdomen: Faint 
4 cm mass in the dome of the right lobe of
the liver consistent with metastatic disease, unchanged.

Musculoskeletal: Stable sclerotic lesion in T7.  No new lesions.

Review of the MIP images confirms the above findings.
IMPRESSION: 1. No pulmonary emboli.
2. Progressive postobstructive consolidation in the right lower lobe
secondary to a mass as previously described.
3. Stable metastatic disease in the dome of the right lobe of the
liver.
4. Stable mediastinal adenopathy.
5. Stable moderate hiatal hernia.

## 2018-08-20 ENCOUNTER — Ambulatory Visit (INDEPENDENT_AMBULATORY_CARE_PROVIDER_SITE_OTHER): Payer: Medicare Other | Admitting: Emergency Medicine

## 2018-08-20 ENCOUNTER — Encounter: Payer: Self-pay | Admitting: Emergency Medicine

## 2018-08-20 DIAGNOSIS — J181 Lobar pneumonia, unspecified organism: Secondary | ICD-10-CM | POA: Diagnosis not present

## 2018-08-20 DIAGNOSIS — J189 Pneumonia, unspecified organism: Secondary | ICD-10-CM

## 2018-08-20 DIAGNOSIS — C3491 Malignant neoplasm of unspecified part of right bronchus or lung: Secondary | ICD-10-CM | POA: Diagnosis not present

## 2018-08-20 DIAGNOSIS — J449 Chronic obstructive pulmonary disease, unspecified: Secondary | ICD-10-CM

## 2018-08-20 MED ORDER — TIOTROPIUM BROMIDE MONOHYDRATE 2.5 MCG/ACT IN AERS: 2.0000 | INHALATION_SPRAY | Freq: Every day | RESPIRATORY_TRACT | 0 refills | Status: DC

## 2018-08-20 NOTE — Assessment & Plan Note (Signed)
Recent diagnosis of pneumonia earlier this month, treated at the cancer center with first Augmentin and then levofloxacin.  He also required a prednisone taper.  Chest x-ray certainly consistent with a pneumonia.  He has clinically responded, still is not back to his normal baseline

## 2018-08-20 NOTE — Assessment & Plan Note (Signed)
More symptomatic, even after treatment of his recent pneumonia.  I think that it would be reasonable to start him on schedule bronchodilator therapy to see if he gets benefit.  He is also having difficulty with secretion management so I will start guaifenesin.  We will teach him Spiriva Respimat today.  If he benefits then we will continue going forward.  He will continue to use his albuterol as needed.

## 2018-08-20 NOTE — Assessment & Plan Note (Signed)
Being managed on Afinitor.  Recent illness does sound like an opportunistic pneumonia but certainly needs his scheduled reimaging, CT chest, in order to ensure no progression of his malignancy.  He has a follow-up with oncology in the middle of January.

## 2018-08-20 NOTE — Progress Notes (Signed)
Subjective:    Patient ID: Richard Navarro, male    DOB: 22-Feb-1946, 72 y.o.   MRN: [id number]  HPI 72 year old former smoker (24 pack years) with a history of stage IV neuroendocrine non-small cell lung cancer and followed in this office for mild COPD.  He also has a history of hypertension, diabetes, chronic kidney disease, arthritis, GERD.  He has been followed by Dr. Ashok Cordia, Dr [name]. He is on daily Afinitor - has made his DM and edema worse. He states that he is doing well - not currently on BD's. Was formerly on O2 after a PNA in 12/18, but no longer uses. He has some exertional SOB with heavy exertion. Minimal cough, no CP.   ROV 06/06/18 --Richard Navarro is 72, a former smoker (70 pack years) with a history of stage IV non-small cell (neuroendocrine) lung cancer.  He also has mild COPD, hypertension, diabetes, chronic kidney disease, GERD.  His most recent CT scan of the chest was done on 04/26/2018 and I have reviewed.  This shows a slight decrease in size of a central right lower lobe mass with endobronchial component, slight improvement in postobstructive changes either atelectasis or pneumonia. He is having some problems with some labile voice, can lose his voice. Minimal allergy symptoms. GERD well controlled currently on his meds. Rare albuterol use, sometimes on hot days. It does seem to help some. He had ablation of his hepatic lesion since last time - went well. Remains on Affinitor with Dr [name].   ROV 08/20/18 --this a follow-up visit for 72 year old gentleman with stage IV non-small cell lung cancer, mild COPD. He is on Afinitor with Dr [name]. He was seen with a fever, tachycardia, dyspnea 12/4 and had a CXR suggestive of R PNA.  Was treated with Augmentin, then required IV abx (levaquin) on 12/6 - improved significantly, but still had sx so added a steroid taper, completed last week. Has kept some SOB, weakness.  He is using albuterol several times a day, has helped his dyspnea.   He is having trouble clearing mucous now. Hasn't has repeat CT chest yet, last 04/2018.    Review of Systems As per HPI     Objective:   Physical Exam   Vitals:   08/20/18 1207  BP: (!) 142/70  Pulse: (!) 101  SpO2: 94%  Weight: 165 lb 12.8 oz (75.2 kg)  Height: 6\' 3"  (1.905 m)   Gen: Pleasant, well-nourished, in no distress,  normal affect  ENT: No lesions,  mouth clear,  oropharynx clear, no postnasal drip  Neck: No JVD, no stridor  Lungs: No use of accessory muscles, no crackles or wheezes.   Cardiovascular: RRR, heart sounds normal, no murmur or gallops, no peripheral edema  Musculoskeletal: No deformities, no cyanosis or clubbing  Neuro: alert, non focal  Skin: Warm, no lesions or rash     Assessment & Plan:  Pneumonia Recent diagnosis of pneumonia earlier this month, treated at the cancer center with first Augmentin and then levofloxacin.  He also required a prednisone taper.  Chest x-ray certainly consistent with a pneumonia.  He has clinically responded, still is not back to his normal baseline  Primary cancer of right lung [facility]) Being managed on Afinitor.  Recent illness does sound like an opportunistic pneumonia but certainly needs his scheduled reimaging, CT chest, in order to ensure no progression of his malignancy.  He has a follow-up with oncology in the middle of January.  COPD (chronic obstructive pulmonary disease) (  [place]) More symptomatic, even after treatment of his recent pneumonia.  I think that it would be reasonable to start him on schedule bronchodilator therapy to see if he gets benefit.  He is also having difficulty with secretion management so I will start guaifenesin.  We will teach him Spiriva Respimat today.  If he benefits then we will continue going forward.  He will continue to use his albuterol as needed.  [name], MD, PhD 08/20/2018, 12:42 PM [name] [phone] or if no answer [phone]

## 2018-08-20 NOTE — Progress Notes (Signed)
Patient seen in the office today and instructed on use of Stiolto Respimat 2.68mcg.  Patient expressed understanding and demonstrated technique.

## 2018-08-20 NOTE — Patient Instructions (Signed)
Please start using guaifenesin 600mg  twice a day We will start Spiriva Respimat 2 puffs once a day Keep your albuterol available to use 2 puffs as needed for shortness of breath, wheeze, cough.  Follow up with Dr [name] this month to plan the timing of your next CT chest. We need to insure no evidence of growth of your lung cancer.  Follow with Dr Lamonte Sakai in 4 months or sooner if you have any problems.

## 2018-08-27 ENCOUNTER — Ambulatory Visit (INDEPENDENT_AMBULATORY_CARE_PROVIDER_SITE_OTHER): Payer: Medicare Other | Admitting: Podiatry

## 2018-08-27 ENCOUNTER — Encounter: Payer: Self-pay | Admitting: Podiatry

## 2018-08-27 DIAGNOSIS — M79675 Pain in left toe(s): Secondary | ICD-10-CM | POA: Diagnosis not present

## 2018-08-27 DIAGNOSIS — B351 Tinea unguium: Secondary | ICD-10-CM | POA: Diagnosis not present

## 2018-08-27 DIAGNOSIS — M79674 Pain in right toe(s): Secondary | ICD-10-CM

## 2018-08-27 DIAGNOSIS — E1151 Type 2 diabetes mellitus with diabetic peripheral angiopathy without gangrene: Secondary | ICD-10-CM

## 2018-08-27 NOTE — Patient Instructions (Signed)

## 2018-09-02 ENCOUNTER — Telehealth: Payer: Self-pay | Admitting: Emergency Medicine

## 2018-09-02 MED ORDER — TIOTROPIUM BROMIDE MONOHYDRATE 2.5 MCG/ACT IN AERS: 2.0000 | INHALATION_SPRAY | Freq: Every day | RESPIRATORY_TRACT | 1 refills | Status: DC

## 2018-09-02 NOTE — Telephone Encounter (Signed)
Spoke to pt's spouse, [name](DPR). [name] stated that pt was given sample of spiriva 2.5 at last OV, and was instruted to call if this medication worked well. Per [name] pt is doing well with spiriva and would like Rx to be [facility] to [facility]. Rx for spiriva has been sent to preferred pharmacy. Nothing further is needed.

## 2018-09-09 ENCOUNTER — Telehealth: Payer: Self-pay | Admitting: Internal Medicine

## 2018-09-09 ENCOUNTER — Inpatient Hospital Stay: Payer: Medicare Other | Attending: Internal Medicine | Admitting: Internal Medicine

## 2018-09-09 ENCOUNTER — Encounter: Payer: Self-pay | Admitting: Internal Medicine

## 2018-09-09 ENCOUNTER — Inpatient Hospital Stay: Payer: Medicare Other

## 2018-09-09 VITALS — BP 153/80 | HR 100 | Temp 98.6°F | Resp 17 | Ht 75.0 in | Wt 162.5 lb

## 2018-09-09 DIAGNOSIS — D649 Anemia, unspecified: Secondary | ICD-10-CM | POA: Diagnosis not present

## 2018-09-09 DIAGNOSIS — R0602 Shortness of breath: Secondary | ICD-10-CM

## 2018-09-09 DIAGNOSIS — C7A8 Other malignant neuroendocrine tumors: Secondary | ICD-10-CM | POA: Insufficient documentation

## 2018-09-09 DIAGNOSIS — D630 Anemia in neoplastic disease: Secondary | ICD-10-CM

## 2018-09-09 DIAGNOSIS — R5383 Other fatigue: Secondary | ICD-10-CM

## 2018-09-09 DIAGNOSIS — I10 Essential (primary) hypertension: Secondary | ICD-10-CM

## 2018-09-09 DIAGNOSIS — C7B8 Other secondary neuroendocrine tumors: Secondary | ICD-10-CM | POA: Insufficient documentation

## 2018-09-09 DIAGNOSIS — C3491 Malignant neoplasm of unspecified part of right bronchus or lung: Secondary | ICD-10-CM

## 2018-09-09 DIAGNOSIS — Z5111 Encounter for antineoplastic chemotherapy: Secondary | ICD-10-CM

## 2018-09-09 LAB — CMP (CANCER CENTER ONLY)
ALT: 17 U/L (ref 0–44)
AST: 16 U/L (ref 15–41)
Albumin: 3.3 g/dL — ABNORMAL LOW (ref 3.5–5.0)
Alkaline Phosphatase: 80 U/L (ref 38–126)
Anion gap: 9 (ref 5–15)
BILIRUBIN TOTAL: 0.3 mg/dL (ref 0.3–1.2)
BUN: 18 mg/dL (ref 8–23)
CO2: 25 mmol/L (ref 22–32)
Calcium: 9.2 mg/dL (ref 8.9–10.3)
Chloride: 105 mmol/L (ref 98–111)
Creatinine: 1.58 mg/dL — ABNORMAL HIGH (ref 0.61–1.24)
GFR, Est AFR Am: 49 mL/min — ABNORMAL LOW (ref >60)
GFR, Estimated: 42 mL/min — ABNORMAL LOW (ref >60)
Glucose, Bld: 181 mg/dL — ABNORMAL HIGH (ref 70–99)
Potassium: 3.6 mmol/L (ref 3.5–5.1)
Sodium: 139 mmol/L (ref 135–145)
Total Protein: 8 g/dL (ref 6.5–8.1)

## 2018-09-09 LAB — CBC WITH DIFFERENTIAL (CANCER CENTER ONLY)
ABS IMMATURE GRANULOCYTES: 0.01 10*3/uL (ref 0.00–0.07)
Basophils Absolute: 0 10*3/uL (ref 0.0–0.1)
Basophils Relative: 0 %
Eosinophils Absolute: 0 10*3/uL (ref 0.0–0.5)
Eosinophils Relative: 0 %
HCT: 30.3 % — ABNORMAL LOW (ref 39.0–52.0)
Hemoglobin: 9.3 g/dL — ABNORMAL LOW (ref 13.0–17.0)
Immature Granulocytes: 0 %
Lymphocytes Relative: 31 %
Lymphs Abs: 1.4 10*3/uL (ref 0.7–4.0)
MCH: 24.2 pg — ABNORMAL LOW (ref 26.0–34.0)
MCHC: 30.7 g/dL (ref 30.0–36.0)
MCV: 78.7 fL — ABNORMAL LOW (ref 80.0–100.0)
Monocytes Absolute: 0.4 10*3/uL (ref 0.1–1.0)
Monocytes Relative: 9 %
NEUTROS ABS: 2.7 10*3/uL (ref 1.7–7.7)
Neutrophils Relative %: 60 %
Platelet Count: 252 10*3/uL (ref 150–400)
RBC: 3.85 MIL/uL — ABNORMAL LOW (ref 4.22–5.81)
RDW: 16.6 % — ABNORMAL HIGH (ref 11.5–15.5)
WBC Count: 4.6 10*3/uL (ref 4.0–10.5)
nRBC: 0 % (ref 0.0–0.2)
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
## 2018-09-09 NOTE — Telephone Encounter (Signed)
Printed calendar and avs. °

## 2018-09-17 NOTE — Progress Notes (Signed)
Subjective: Richard Navarro is a 73 y.o. y.o. male who presents for preventative foot care today with diabetes,  PAD and cc of painful, discolored, thick toenails.  Pain is aggravated when wearing enclosed shoe gear. Pain is relieved with periodic professional debridement.  [name], MD is his PCP.    Current Outpatient Medications:  .  AFINITOR 7.5 MG tablet, Take 1 tablet (7.5 mg total) by mouth daily., Disp: 90 tablet, Rfl: 2 .  albuterol (PROVENTIL HFA;VENTOLIN HFA) 108 (90 Base) MCG/ACT inhaler, Inhale 2 puffs into the lungs every 4 (four) hours as needed for wheezing or shortness of breath., Disp: 1 Inhaler, Rfl: 0 .  allopurinol (ZYLOPRIM) 100 MG tablet, Take 1 tablet (100 mg total) by mouth daily., Disp: 90 tablet, Rfl: 3 .  amLODipine (NORVASC) 5 MG tablet, Take 1 tablet (5 mg total) by mouth daily., Disp: 90 tablet, Rfl: 3 .  diclofenac sodium (VOLTAREN) 1 % GEL, APPLY 2 GRAMS TOPICALLY FOUR TIMES A DAY AS NEEDED FOR LEFT SHOULDER ARTHRITIS, Disp: 300 g, Rfl: 3 .  Ferrous Sulfate (IRON) 325 (65 Fe) MG TABS, Take 1 tablet by mouth daily., Disp: , Rfl:  .  glimepiride (AMARYL) 2 MG tablet, TAKE 1 TABLET ONCE DAILY BEFORE BREAKFAST., Disp: 90 tablet, Rfl: 3 .  glucose blood test strip, Use to test blood sugar twice a day, Disp: 200 each, Rfl: 3 .  loperamide (IMODIUM A-D) 2 MG tablet, Take 1 mg by mouth daily as needed for diarrhea or loose stools., Disp: , Rfl:  .  metFORMIN (GLUCOPHAGE) 500 MG tablet, Take 1 tablet (500 mg total) by mouth 2 (two) times daily with a meal., Disp: 180 tablet, Rfl: 3 .  pantoprazole (PROTONIX) 40 MG tablet, Take 1 tablet (40 mg total) by mouth daily., Disp: 90 tablet, Rfl: 3 .  Tetrahydrozoline HCl (EYE DROPS OP), Place 1 drop into both eyes daily as needed (redness relief)., Disp: , Rfl:  .  vitamin B-12 (CYANOCOBALAMIN) 1000 MCG tablet, Take 1,000 mcg by mouth daily., Disp: , Rfl:  .  vitamin C (ASCORBIC ACID) 500 MG tablet, Take 500 mg by mouth  daily., Disp: , Rfl:  .  guaiFENesin (MUCINEX) 600 MG 12 hr tablet, Take by mouth 2 (two) times daily., Disp: , Rfl:  .  Tiotropium Bromide Monohydrate (SPIRIVA RESPIMAT) 2.5 MCG/ACT AERS, Inhale 2 puffs into the lungs daily., Disp: 3 Inhaler, Rfl: 1  Allergies  Allergen Reactions  . Lisinopril Swelling    Angioedema- on this and afinitor same time  . Simvastatin Other (See Comments)    Joint ache    Objective: Vascular Examination: Capillary refill time <3 seconds x 10 digits  Dorsalis pedis pulses 1/4 b/l  Posterior tibial pulses absent b/l  No digital hair x 10 digits  Skin temperature gradient WNL  b/l  Dermatological Examination: Skin thin, shiny and atrophic b/l  Toenails 1-5 b/l discolored, thick, dystrophic with subungual debris and pain with palpation to nailbeds due to thickness of nails.  No open wounds  No interdigital macerations  Musculoskeletal: Muscle strength 5/5 to all LE muscle groups  Semi-flexible hammertoes 2-5 b/l  No pain with calf compression b/l  Neurological: Sensation intact with 10 gram monofilament.  Assessment: 1. Painful onychomycosis toenails 1-5 b/l 2. NIDDM with Peripheral arterial disease  Plan: 1. Continue diabetic foot care principles. Literature dispensed. 2. Toenails 1-5 b/l were debrided in length and girth without iatrogenic bleeding. 3. Patient to continue soft, supportive shoe gear 4. Patient  to report any pedal injuries to medical professional  5. Follow up 3 months.  6. Patient/POA to call should there be a concern in the interim.

## 2018-09-24 ENCOUNTER — Encounter: Payer: Self-pay | Admitting: Family Medicine

## 2018-09-24 ENCOUNTER — Ambulatory Visit (INDEPENDENT_AMBULATORY_CARE_PROVIDER_SITE_OTHER): Payer: Medicare Other | Admitting: Family Medicine

## 2018-09-24 VITALS — BP 138/78 | HR 109 | Temp 98.5°F | Ht 75.0 in | Wt 161.0 lb

## 2018-09-24 DIAGNOSIS — E119 Type 2 diabetes mellitus without complications: Secondary | ICD-10-CM

## 2018-09-24 DIAGNOSIS — E118 Type 2 diabetes mellitus with unspecified complications: Secondary | ICD-10-CM

## 2018-09-24 DIAGNOSIS — R000 Tachycardia, unspecified: Secondary | ICD-10-CM

## 2018-09-24 DIAGNOSIS — M1A09X0 Idiopathic chronic gout, multiple sites, without tophus (tophi): Secondary | ICD-10-CM

## 2018-09-24 LAB — POCT GLYCOSYLATED HEMOGLOBIN (HGB A1C): Hemoglobin A1C: 6.6 % — AB (ref 4.0–5.6)

## 2018-09-24 NOTE — Patient Instructions (Addendum)
Lab Results  Component Value Date   HGBA1C 6.6 (A) 09/24/2018  A1c looks great-continue current medications  EKG was reassuring  Please let me know after you get the results from the CT scan- if they do not think the shortness of breath is related to an issue on the scan we may consider further work-up such as an echocardiogram of the heart or a monitor of the heart given the higher heart rates.  I do wonder if your anemia could be causing some of the issues - Lets trial iron 3 times a week instead of daily-I am going to send a message to Dr. Earlie Server to make sure he is not opposed to this plan-we will only reach out if we hear back and he is opposed to this.  You can also ask him at your next visit in reference this sheet.

## 2018-09-24 NOTE — Progress Notes (Signed)
Phone [phone]   Subjective:  Richard Navarro is a 73 y.o. year old very pleasant male patient who presents for/with See problem oriented charting ROS-complains of fatigue and shortness of breath.  No chest pain reported.  No increased edema  Past Medical History-  Patient Active Problem List   Diagnosis Date Noted  . Neuroendocrine carcinoma ([place]) 08/09/2017    Priority: High  . Primary cancer of right lung ([place]) 12/22/2016    Priority: High  . Diabetes mellitus without complication ([place])     Priority: High  . CKD (chronic kidney disease), stage III ([place]) 03/04/2018    Priority: Medium  . Myalgia due to statin 03/04/2018    Priority: Medium  . COPD (chronic obstructive pulmonary disease) ([place]) 12/01/2016    Priority: Medium  . PTSD (post-traumatic stress disorder) 12/07/2015    Priority: Medium  . Erectile dysfunction 04/09/2014    Priority: Medium  . Former smoker 03/26/2014    Priority: Medium  . Hypertension     Priority: Medium  . Gout     Priority: Medium  . Depression     Priority: Medium  . Hyperlipidemia     Priority: Medium  . Pneumonia 08/09/2017    Priority: Low  . Encounter for antineoplastic chemotherapy 01/10/2017    Priority: Low  . Goals of care, counseling/discussion 01/10/2017    Priority: Low  . Prostate cancer screening 04/17/2014    Priority: Low  . Arthritis 03/26/2014    Priority: Low  . History of adenomatous polyp of colon 03/26/2014    Priority: Low  . GERD (gastroesophageal reflux disease)     Priority: Low  . 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 Tachycardia 08/09/2017  . Urinary frequency 04/27/2017    Medications- reviewed and updated Current Outpatient Medications  Medication Sig Dispense Refill  . AFINITOR 7.5 MG tablet Take 1 tablet (7.5 mg total) by mouth daily. 90 tablet 2  . albuterol (PROVENTIL HFA;VENTOLIN HFA) 108 (90 Base) MCG/ACT inhaler Inhale 2 puffs into the lungs every 4 (four) hours as needed for  wheezing or shortness of breath. 1 Inhaler 0  . allopurinol (ZYLOPRIM) 100 MG tablet Take 1 tablet (100 mg total) by mouth daily. 90 tablet 3  . amLODipine (NORVASC) 5 MG tablet Take 1 tablet (5 mg total) by mouth daily. 90 tablet 3  . diclofenac sodium (VOLTAREN) 1 % GEL APPLY 2 GRAMS TOPICALLY FOUR TIMES A DAY AS NEEDED FOR LEFT SHOULDER ARTHRITIS 300 g 3  . Ferrous Sulfate (IRON) 325 (65 Fe) MG TABS Take 1 tablet by mouth daily.    Marland Kitchen glimepiride (AMARYL) 2 MG tablet TAKE 1 TABLET ONCE DAILY BEFORE BREAKFAST. 90 tablet 3  . glucose blood test strip Use to test blood sugar twice a day 200 each 3  . guaiFENesin (MUCINEX) 600 MG 12 hr tablet Take by mouth 2 (two) times daily.    Marland Kitchen loperamide (IMODIUM A-D) 2 MG tablet Take 1 mg by mouth daily as needed for diarrhea or loose stools.    . metFORMIN (GLUCOPHAGE) 500 MG tablet Take 1 tablet (500 mg total) by mouth 2 (two) times daily with a meal. 180 tablet 3  . pantoprazole (PROTONIX) 40 MG tablet Take 1 tablet (40 mg total) by mouth daily. 90 tablet 3  . Tetrahydrozoline HCl (EYE DROPS OP) Place 1 drop into both eyes daily as needed (redness relief).    . Tiotropium Bromide Monohydrate (SPIRIVA RESPIMAT) 2.5 MCG/ACT AERS Inhale 2 puffs into the lungs  daily. 3 Inhaler 1  . vitamin B-12 (CYANOCOBALAMIN) 1000 MCG tablet Take 1,000 mcg by mouth daily.    . vitamin C (ASCORBIC ACID) 500 MG tablet Take 500 mg by mouth daily.       Objective:  BP 138/78 (BP Location: Right Arm, Patient Position: Sitting, Cuff Size: Normal)   Pulse (!) 109   Temp 98.5 F (36.9 C) (Oral)   Ht 6\' 3"  (1.905 m)   Wt 161 lb (73 kg)   SpO2 94%   BMI 20.12 kg/m  Gen: NAD, resting comfortably CV: Tachycardic but regular no murmurs rubs or gallops Lungs: CTAB no crackles, wheeze, rhonchi Abdomen: soft/nontender/nondistended/normal bowel sounds.  Ext: trace edema Skin: warm, dry Neuro: grossly normal, moves all extremities  EKG: Sinus tachycardia with rate 100, normal  axis, normal intervals, no hypertrophy, no st or t wave chages    Assessment and Plan   #Hypertension associated with diabetes S: Compliant with amlodipine 5 mg A/P: High normal but stable. Continue current medications.     #Gout S: Compliant with allopurinol 100 mg.   Lab Results  Component Value Date   LABURIC 4.8 03/04/2018   A/P:    Stable. Continue current medications.  Uric acid has been well controlled  Diabetes mellitus without complication ([facility]CC) S: Reasonably controlled on metformin 500 mg twice a day and Amaryl 2 mg last visit-with ongoing cancer treatment I believe an A1c goal of 8 or less is reasonable.  It was a day too early for A1c last visit Lab Results  Component Value Date   HGBA1C 7.8 (A) 03/04/2018   HGBA1C 7.8 11/26/2017   HGBA1C 7.2 07/05/2017   A/P: Hopefully stable-update A1c.   May need to reduce or stop metformin depending on GFR trajectory-  GFR within 3 weeks was actually improved into the upper 40s so can continue current dose   Other notes: 1.  Hematology following hemoglobin-has been in the eights and nines.  He is on oral iron- taking twice a day.  2.  Continues to be followed by heme-onc for lung cancer-neuroendocrine carcinoma-he is on Afinitor-follow-up in February for repeat chest CT 3. Was treated for pneumonia with cancer center- was treated with augmentin, later levaquin, later prednisone. He has had a hard time recovering- stable SOb since that time. Saw Dr. Lamonte Sakai as well as he was getting SOB. CT scan planned on the 18th of February. Home oxygen 95-96 but heart rate in the 100s - would consider echocardiogram as well. No recent increased edema or weight gain though -get EKG with tachycardia-sinus tachycardia only.  No obvious atrial fibrillation at this time but could consider Holter monitor - anemia could contribute to his fatigue--> From AVS " Please let me know after you get the results from the CT scan- if they do not think the shortness of  breath is related to an issue on the scan we may consider further work-up such as an echocardiogram of the heart or a monitor of the heart given the higher heart rates.  I do wonder if your anemia could be causing some of the issues - Lets trial iron 3 times a week instead of daily-I am going to send a message to Dr. Earlie Server to make sure he is not opposed to this plan-we will only reach out if we hear back and he is opposed to this.  You can also ask him at your next visit in reference this sheet. "  Future Appointments  Date Time Provider Department  Center  10/08/2018  8:00 AM [facility] 1 [facility] None  10/08/2018  9:00 AM [facility] [place]  10/15/2018  2:45 PM Curt Bears, MD [facility] None  11/27/2018  1:30 PM Marzetta Board, DPM [facility]  12/19/2018 10:30 AM Collene Gobble, MD [facility] None  01/30/2019  3:40 PM [facility] Channel Brayton Mars, MD [facility]    Lab/Order associations: Diabetes mellitus type 2 with complications ([place]) - Plan: POCT glycosylated hemoglobin (Hb A1C)  Tachycardia - Plan: EKG 12-Lead  Diabetes mellitus without complication (HCC)  Idiopathic chronic gout of multiple sites without tophus  Return precautions advised.  [name], MD

## 2018-09-24 NOTE — Assessment & Plan Note (Signed)
S: Reasonably controlled on metformin 500 mg twice a day and Amaryl 2 mg last visit-with ongoing cancer treatment I believe an A1c goal of 8 or less is reasonable.  It was a day too early for A1c last visit Lab Results  Component Value Date   HGBA1C 7.8 (A) 03/04/2018   HGBA1C 7.8 11/26/2017   HGBA1C 7.2 07/05/2017   A/P: Hopefully stable-update A1c.   May need to reduce or stop metformin depending on GFR trajectory-  GFR within 3 weeks was actually improved into the upper 40s so can continue current dose

## 2018-10-02 ENCOUNTER — Telehealth: Payer: Self-pay | Admitting: Internal Medicine

## 2018-10-02 NOTE — Telephone Encounter (Signed)
MM PAL 2/25 - moved f/u to the AM with [facility] - Shared visit. Spoke with spouse.

## 2018-10-08 ENCOUNTER — Inpatient Hospital Stay: Payer: Medicare Other | Attending: Internal Medicine

## 2018-10-08 ENCOUNTER — Encounter (HOSPITAL_COMMUNITY): Payer: Self-pay

## 2018-10-08 ENCOUNTER — Ambulatory Visit (HOSPITAL_COMMUNITY)
Admission: RE | Admit: 2018-10-08 | Discharge: 2018-10-08 | Disposition: A | Discharge status: Home / Self Care | Payer: Medicare Other | Source: Ambulatory Visit | Attending: Internal Medicine | Admitting: Internal Medicine

## 2018-10-08 DIAGNOSIS — D649 Anemia, unspecified: Secondary | ICD-10-CM | POA: Diagnosis not present

## 2018-10-08 DIAGNOSIS — C7A8 Other malignant neuroendocrine tumors: Secondary | ICD-10-CM | POA: Diagnosis not present

## 2018-10-08 DIAGNOSIS — C787 Secondary malignant neoplasm of liver and intrahepatic bile duct: Secondary | ICD-10-CM | POA: Diagnosis not present

## 2018-10-08 DIAGNOSIS — C7B8 Other secondary neuroendocrine tumors: Secondary | ICD-10-CM | POA: Diagnosis not present

## 2018-10-08 DIAGNOSIS — R05 Cough: Secondary | ICD-10-CM | POA: Insufficient documentation

## 2018-10-08 DIAGNOSIS — R0602 Shortness of breath: Secondary | ICD-10-CM | POA: Diagnosis not present

## 2018-10-08 DIAGNOSIS — C3431 Malignant neoplasm of lower lobe, right bronchus or lung: Secondary | ICD-10-CM | POA: Diagnosis not present

## 2018-10-08 LAB — CBC WITH DIFFERENTIAL (CANCER CENTER ONLY)
Abs Immature Granulocytes: 0.01 10*3/uL (ref 0.00–0.07)
Basophils Absolute: 0 10*3/uL (ref 0.0–0.1)
Basophils Relative: 1 %
Eosinophils Absolute: 0 10*3/uL (ref 0.0–0.5)
Eosinophils Relative: 1 %
HCT: 31 % — ABNORMAL LOW (ref 39.0–52.0)
Hemoglobin: 9.3 g/dL — ABNORMAL LOW (ref 13.0–17.0)
Immature Granulocytes: 0 %
Lymphocytes Relative: 39 %
Lymphs Abs: 1.5 10*3/uL (ref 0.7–4.0)
MCH: 23.5 pg — ABNORMAL LOW (ref 26.0–34.0)
MCHC: 30 g/dL (ref 30.0–36.0)
MCV: 78.3 fL — ABNORMAL LOW (ref 80.0–100.0)
Monocytes Absolute: 0.4 10*3/uL (ref 0.1–1.0)
Monocytes Relative: 9 %
Neutro Abs: 2 10*3/uL (ref 1.7–7.7)
Neutrophils Relative %: 50 %
Platelet Count: 200 10*3/uL (ref 150–400)
RBC: 3.96 MIL/uL — ABNORMAL LOW (ref 4.22–5.81)
RDW: 16.6 % — ABNORMAL HIGH (ref 11.5–15.5)
WBC Count: 3.9 10*3/uL — ABNORMAL LOW (ref 4.0–10.5)
nRBC: 0 % (ref 0.0–0.2)

## 2018-10-08 LAB — CMP (CANCER CENTER ONLY)
ALT: 13 U/L (ref 0–44)
AST: 15 U/L (ref 15–41)
Albumin: 3.3 g/dL — ABNORMAL LOW (ref 3.5–5.0)
Alkaline Phosphatase: 76 U/L (ref 38–126)
Anion gap: 11 (ref 5–15)
BUN: 19 mg/dL (ref 8–23)
CHLORIDE: 105 mmol/L (ref 98–111)
CO2: 25 mmol/L (ref 22–32)
Calcium: 9 mg/dL (ref 8.9–10.3)
Creatinine: 1.47 mg/dL — ABNORMAL HIGH (ref 0.61–1.24)
GFR, Est AFR Am: 54 mL/min — ABNORMAL LOW (ref >60)
GFR, Estimated: 46 mL/min — ABNORMAL LOW (ref >60)
Glucose, Bld: 139 mg/dL — ABNORMAL HIGH (ref 70–99)
Potassium: 3.6 mmol/L (ref 3.5–5.1)
Sodium: 141 mmol/L (ref 135–145)
Total Bilirubin: <0.2 mg/dL — ABNORMAL LOW (ref 0.3–1.2)
Total Protein: 7.9 g/dL (ref 6.5–8.1)

## 2018-10-15 ENCOUNTER — Telehealth: Payer: Self-pay | Admitting: Physician Assistant

## 2018-10-15 ENCOUNTER — Inpatient Hospital Stay (HOSPITAL_BASED_OUTPATIENT_CLINIC_OR_DEPARTMENT_OTHER): Payer: Medicare Other | Admitting: Physician Assistant

## 2018-10-15 VITALS — BP 140/62 | HR 62 | Temp 98.1°F | Resp 17 | Ht 75.0 in | Wt 161.0 lb

## 2018-10-15 DIAGNOSIS — J189 Pneumonia, unspecified organism: Secondary | ICD-10-CM

## 2018-10-15 DIAGNOSIS — R0602 Shortness of breath: Secondary | ICD-10-CM

## 2018-10-15 DIAGNOSIS — R61 Generalized hyperhidrosis: Secondary | ICD-10-CM

## 2018-10-15 DIAGNOSIS — C7B8 Other secondary neuroendocrine tumors: Secondary | ICD-10-CM

## 2018-10-15 DIAGNOSIS — D630 Anemia in neoplastic disease: Secondary | ICD-10-CM

## 2018-10-15 DIAGNOSIS — R05 Cough: Secondary | ICD-10-CM

## 2018-10-15 DIAGNOSIS — D649 Anemia, unspecified: Secondary | ICD-10-CM

## 2018-10-15 DIAGNOSIS — C7A8 Other malignant neuroendocrine tumors: Secondary | ICD-10-CM | POA: Diagnosis not present

## 2018-10-15 DIAGNOSIS — J181 Lobar pneumonia, unspecified organism: Secondary | ICD-10-CM

## 2018-10-15 MED ORDER — DOXYCYCLINE HYCLATE 100 MG PO TABS: 100.0000 mg | ORAL_TABLET | Freq: Two times a day (BID) | ORAL | 0 refills | Status: DC

## 2018-10-15 NOTE — Telephone Encounter (Signed)
Scheduled appt per 02/25 los.  Patient declined calendar and avs.
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
## 2018-11-12 ENCOUNTER — Telehealth: Payer: Self-pay | Admitting: Medical Oncology

## 2018-11-12 NOTE — Telephone Encounter (Signed)
R/s pt for next week . Pt feels good ,no problems. Message sent.

## 2018-11-13 ENCOUNTER — Other Ambulatory Visit: Payer: Medicare Other

## 2018-11-13 ENCOUNTER — Ambulatory Visit: Payer: Medicare Other | Admitting: Internal Medicine

## 2018-11-14 ENCOUNTER — Telehealth: Payer: Self-pay | Admitting: Internal Medicine

## 2018-11-14 NOTE — Telephone Encounter (Signed)
R/s appt per 3/24 sch message - spoke with patient . He is aware of appt date and time

## 2018-11-19 ENCOUNTER — Inpatient Hospital Stay (HOSPITAL_BASED_OUTPATIENT_CLINIC_OR_DEPARTMENT_OTHER): Payer: Medicare Other | Admitting: Internal Medicine

## 2018-11-19 ENCOUNTER — Encounter: Payer: Self-pay | Admitting: Internal Medicine

## 2018-11-19 ENCOUNTER — Inpatient Hospital Stay: Payer: Medicare Other | Attending: Internal Medicine

## 2018-11-19 ENCOUNTER — Other Ambulatory Visit: Payer: Self-pay

## 2018-11-19 VITALS — BP 114/88 | HR 119 | Temp 97.7°F | Resp 18 | Ht 75.0 in | Wt 159.0 lb

## 2018-11-19 DIAGNOSIS — C7B8 Other secondary neuroendocrine tumors: Secondary | ICD-10-CM | POA: Insufficient documentation

## 2018-11-19 DIAGNOSIS — C7A8 Other malignant neuroendocrine tumors: Secondary | ICD-10-CM

## 2018-11-19 DIAGNOSIS — Z5111 Encounter for antineoplastic chemotherapy: Secondary | ICD-10-CM

## 2018-11-19 DIAGNOSIS — C3491 Malignant neoplasm of unspecified part of right bronchus or lung: Secondary | ICD-10-CM

## 2018-11-19 LAB — CBC WITH DIFFERENTIAL (CANCER CENTER ONLY)
Abs Immature Granulocytes: 0.02 10*3/uL (ref 0.00–0.07)
Basophils Absolute: 0 10*3/uL (ref 0.0–0.1)
Basophils Relative: 0 %
Eosinophils Absolute: 0 10*3/uL (ref 0.0–0.5)
Eosinophils Relative: 1 %
HCT: 34.5 % — ABNORMAL LOW (ref 39.0–52.0)
Hemoglobin: 10.6 g/dL — ABNORMAL LOW (ref 13.0–17.0)
Immature Granulocytes: 0 %
LYMPHS ABS: 1.7 10*3/uL (ref 0.7–4.0)
Lymphocytes Relative: 35 %
MCH: 23.7 pg — ABNORMAL LOW (ref 26.0–34.0)
MCHC: 30.7 g/dL (ref 30.0–36.0)
MCV: 77 fL — ABNORMAL LOW (ref 80.0–100.0)
Monocytes Absolute: 0.5 10*3/uL (ref 0.1–1.0)
Monocytes Relative: 10 %
Neutro Abs: 2.6 10*3/uL (ref 1.7–7.7)
Neutrophils Relative %: 54 %
Platelet Count: 219 10*3/uL (ref 150–400)
RBC: 4.48 MIL/uL (ref 4.22–5.81)
RDW: 16.6 % — ABNORMAL HIGH (ref 11.5–15.5)
WBC Count: 4.9 10*3/uL (ref 4.0–10.5)
nRBC: 0 % (ref 0.0–0.2)

## 2018-11-19 LAB — CMP (CANCER CENTER ONLY)
ALT: 17 U/L (ref 0–44)
AST: 17 U/L (ref 15–41)
Albumin: 3.4 g/dL — ABNORMAL LOW (ref 3.5–5.0)
Alkaline Phosphatase: 81 U/L (ref 38–126)
Anion gap: 13 (ref 5–15)
BUN: 18 mg/dL (ref 8–23)
CO2: 24 mmol/L (ref 22–32)
Calcium: 9.3 mg/dL (ref 8.9–10.3)
Chloride: 102 mmol/L (ref 98–111)
Creatinine: 1.76 mg/dL — ABNORMAL HIGH (ref 0.61–1.24)
GFR, Est AFR Am: 43 mL/min — ABNORMAL LOW (ref >60)
GFR, Estimated: 37 mL/min — ABNORMAL LOW (ref >60)
Glucose, Bld: 261 mg/dL — ABNORMAL HIGH (ref 70–99)
Potassium: 3.5 mmol/L (ref 3.5–5.1)
Sodium: 139 mmol/L (ref 135–145)
Total Bilirubin: 0.3 mg/dL (ref 0.3–1.2)
Total Protein: 8.2 g/dL — ABNORMAL HIGH (ref 6.5–8.1)
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
## 2018-11-21 ENCOUNTER — Telehealth: Payer: Self-pay | Admitting: Internal Medicine

## 2018-11-21 NOTE — Telephone Encounter (Signed)
Called regarding 5/29 and 6/2

## 2018-11-27 ENCOUNTER — Ambulatory Visit: Payer: Medicare Other | Admitting: Podiatry

## 2018-12-19 ENCOUNTER — Ambulatory Visit: Payer: Medicare Other | Admitting: Emergency Medicine

## 2019-01-08 ENCOUNTER — Other Ambulatory Visit: Payer: Self-pay

## 2019-01-08 ENCOUNTER — Encounter: Payer: Self-pay | Admitting: Emergency Medicine

## 2019-01-08 ENCOUNTER — Ambulatory Visit (INDEPENDENT_AMBULATORY_CARE_PROVIDER_SITE_OTHER): Payer: Medicare Other | Admitting: Emergency Medicine

## 2019-01-08 DIAGNOSIS — C7A8 Other malignant neuroendocrine tumors: Secondary | ICD-10-CM | POA: Diagnosis not present

## 2019-01-08 DIAGNOSIS — J449 Chronic obstructive pulmonary disease, unspecified: Secondary | ICD-10-CM | POA: Diagnosis not present

## 2019-01-08 NOTE — Assessment & Plan Note (Signed)
Please continue to use Spiriva Respimat 2 puffs once a day Keep your albuterol available to use We will change your mucinex to take 600mg  up to twice a day if needed for mucous production and clearance.  Get your flu shot in the Fall.  Follow with Dr Lamonte Sakai in 6 months or sooner if you have any problems

## 2019-01-08 NOTE — Assessment & Plan Note (Signed)
Get your repeat CT chest on 5/29 as planned.  Follow with Dr [name] as planned.

## 2019-01-08 NOTE — Patient Instructions (Addendum)
Please continue to use Spiriva Respimat 2 puffs once a day Keep your albuterol available to use Get your repeat CT chest on 5/29 as planned.  We will change your mucinex to take 600mg  up to twice a day if needed for mucous production and clearance.  Get your flu shot in the Fall.  Follow with Dr Lamonte Sakai in 6 months or sooner if you have any problems

## 2019-01-08 NOTE — Progress Notes (Signed)
Subjective:    Patient ID: Richard Navarro, male    DOB: May 28, 1946, 73 y.o.   MRN: [id number]  HPI 73 year old former smoker (99 pack years) with a history of stage IV neuroendocrine non-small cell lung cancer and followed in this office for mild COPD.  He also has a history of hypertension, diabetes, chronic kidney disease, arthritis, GERD.  He has been followed by Dr. Ashok Cordia, Dr [name]. He is on daily Afinitor - has made his DM and edema worse. He states that he is doing well - not currently on BD's. Was formerly on O2 after a PNA in 12/18, but no longer uses. He has some exertional SOB with heavy exertion. Minimal cough, no CP.   ROV 06/06/18 --[name] is 73, a former smoker (70 pack years) with a history of stage IV non-small cell (neuroendocrine) lung cancer.  He also has mild COPD, hypertension, diabetes, chronic kidney disease, GERD.  His most recent CT scan of the chest was done on 04/26/2018 and I have reviewed.  This shows a slight decrease in size of a central right lower lobe mass with endobronchial component, slight improvement in postobstructive changes either atelectasis or pneumonia. He is having some problems with some labile voice, can lose his voice. Minimal allergy symptoms. GERD well controlled currently on his meds. Rare albuterol use, sometimes on hot days. It does seem to help some. He had ablation of his hepatic lesion since last time - went well. Remains on Affinitor with Dr [name].   ROV 08/20/18 --this a follow-up visit for 73 year old gentleman with stage IV non-small cell lung cancer, mild COPD. He is on Afinitor with Dr [name]. He was seen with a fever, tachycardia, dyspnea 12/4 and had a CXR suggestive of R PNA.  Was treated with Augmentin, then required IV abx (levaquin) on 12/6 - improved significantly, but still had sx so added a steroid taper, completed last week. Has kept some SOB, weakness.  He is using albuterol several times a day, has helped his dyspnea.   He is having trouble clearing mucous now. Hasn't has repeat CT chest yet, last 04/2018.  ROV 01/08/2019 --follow-up visit for 73 year old man with stage IV neuroendocrine cell cancer (liver involvement) on Afinitor.  He also has COPD and we have followed him for this.  His most recent CT chest was done 10/08/2018 and I reviewed, shows some slight decrease in size in his central right lower lobe mass, stable mild mediastinal lymphadenopathy, some increased right basilar collapse/groundglass.  His neck CT is scheduled for 5/29.  Last time we tried starting him on Spiriva Respimat to see if he would get benefit.  We also started guaifenesin because he was having significant sputum production and cough.  Today he reports that he has waxing waning sx, dyspnea. Has to rest after heavy exertion. He feels that the Spiriva has helped him, less dyspnea. Less mucous production - remains on mucinex.     Review of Systems As per HPI     Objective:   Physical Exam   Vitals:   01/08/19 1001  BP: (!) 146/78  Pulse: 94  Temp: 98.2 F (36.8 C)  SpO2: 100%  Weight: 164 lb 3.2 oz (74.5 kg)  Height: 6\' 3"  (1.905 m)   Gen: Pleasant, well-nourished, in no distress,  normal affect  ENT: No lesions,  mouth clear,  oropharynx clear, no postnasal drip  Neck: No JVD, no stridor  Lungs: No use of accessory muscles, no crackles or wheezes.  Cardiovascular: RRR, heart sounds normal, no murmur or gallops, no peripheral edema  Musculoskeletal: No deformities, no cyanosis or clubbing  Neuro: alert, non focal  Skin: Warm, no lesions or rash     Assessment & Plan:  COPD (chronic obstructive pulmonary disease) (HCC) Please continue to use Spiriva Respimat 2 puffs once a day Keep your albuterol available to use We will change your mucinex to take 600mg  up to twice a day if needed for mucous production and clearance.  Get your flu shot in the Fall.  Follow with Dr Lamonte Sakai in 6 months or sooner if you have any  problems  Neuroendocrine carcinoma [facility]) Get your repeat CT chest on 5/29 as planned.  Follow with Dr [name] as planned.   [name], MD, PhD 01/08/2019, 10:19 AM [facility] [phone] or if no answer [phone]

## 2019-01-09 ENCOUNTER — Other Ambulatory Visit: Payer: Self-pay | Admitting: Medical Oncology

## 2019-01-09 ENCOUNTER — Telehealth: Payer: Self-pay | Admitting: Medical Oncology

## 2019-01-09 ENCOUNTER — Other Ambulatory Visit: Payer: Self-pay | Admitting: Oncology

## 2019-01-09 DIAGNOSIS — C7A8 Other malignant neuroendocrine tumors: Secondary | ICD-10-CM

## 2019-01-09 NOTE — Telephone Encounter (Signed)
Sandi Raveling said she wanted [place] to know she called phamacy for afinitor request and they may fax a request. CT scan soon and f/u.

## 2019-01-14 ENCOUNTER — Other Ambulatory Visit: Payer: Self-pay

## 2019-01-14 ENCOUNTER — Ambulatory Visit (INDEPENDENT_AMBULATORY_CARE_PROVIDER_SITE_OTHER): Payer: Medicare Other | Admitting: Podiatry

## 2019-01-14 ENCOUNTER — Encounter: Payer: Self-pay | Admitting: Podiatry

## 2019-01-14 VITALS — Temp 97.7°F

## 2019-01-14 DIAGNOSIS — M79675 Pain in left toe(s): Secondary | ICD-10-CM

## 2019-01-14 DIAGNOSIS — M79674 Pain in right toe(s): Secondary | ICD-10-CM | POA: Diagnosis not present

## 2019-01-14 DIAGNOSIS — E1151 Type 2 diabetes mellitus with diabetic peripheral angiopathy without gangrene: Secondary | ICD-10-CM | POA: Diagnosis not present

## 2019-01-14 DIAGNOSIS — B351 Tinea unguium: Secondary | ICD-10-CM | POA: Diagnosis not present

## 2019-01-14 NOTE — Patient Instructions (Signed)
Diabetes Mellitus and Foot Care  Foot care is an important part of your health, especially when you have diabetes. Diabetes may cause you to have problems because of poor blood flow (circulation) to your feet and legs, which can cause your skin to:   Become thinner and drier.   Break more easily.   Heal more slowly.   Peel and crack.  You may also have nerve damage (neuropathy) in your legs and feet, causing decreased feeling in them. This means that you may not notice minor injuries to your feet that could lead to more serious problems. Noticing and addressing any potential problems early is the best way to prevent future foot problems.  How to care for your feet  Foot hygiene   Wash your feet daily with warm water and mild soap. Do not use hot water. Then, pat your feet and the areas between your toes until they are completely dry. Do not soak your feet as this can dry your skin.   Trim your toenails straight across. Do not dig under them or around the cuticle. File the edges of your nails with an emery board or nail file.   Apply a moisturizing lotion or petroleum jelly to the skin on your feet and to dry, brittle toenails. Use lotion that does not contain alcohol and is unscented. Do not apply lotion between your toes.  Shoes and socks   Wear clean socks or stockings every day. Make sure they are not too tight. Do not wear knee-high stockings since they may decrease blood flow to your legs.   Wear shoes that fit properly and have enough cushioning. Always look in your shoes before you put them on to be sure there are no objects inside.   To break in new shoes, wear them for just a few hours a day. This prevents injuries on your feet.  Wounds, scrapes, corns, and calluses   Check your feet daily for blisters, cuts, bruises, sores, and redness. If you cannot see the bottom of your feet, use a mirror or ask someone for help.   Do not cut corns or calluses or try to remove them with medicine.   If you  find a minor scrape, cut, or break in the skin on your feet, keep it and the skin around it clean and dry. You may clean these areas with mild soap and water. Do not clean the area with peroxide, alcohol, or iodine.   If you have a wound, scrape, corn, or callus on your foot, look at it several times a day to make sure it is healing and not infected. Check for:  ? Redness, swelling, or pain.  ? Fluid or blood.  ? Warmth.  ? Pus or a bad smell.  General instructions   Do not cross your legs. This may decrease blood flow to your feet.   Do not use heating pads or hot water bottles on your feet. They may burn your skin. If you have lost feeling in your feet or legs, you may not know this is happening until it is too late.   Protect your feet from hot and cold by wearing shoes, such as at the beach or on hot pavement.   Schedule a complete foot exam at least once a year (annually) or more often if you have foot problems. If you have foot problems, report any cuts, sores, or bruises to your health care provider immediately.  Contact a health care provider if:     You have a medical condition that increases your risk of infection and you have any cuts, sores, or bruises on your feet.   You have an injury that is not healing.   You have redness on your legs or feet.   You feel burning or tingling in your legs or feet.   You have pain or cramps in your legs and feet.   Your legs or feet are numb.   Your feet always feel cold.   You have pain around a toenail.  Get help right away if:   You have a wound, scrape, corn, or callus on your foot and:  ? You have pain, swelling, or redness that gets worse.  ? You have fluid or blood coming from the wound, scrape, corn, or callus.  ? Your wound, scrape, corn, or callus feels warm to the touch.  ? You have pus or a bad smell coming from the wound, scrape, corn, or callus.  ? You have a fever.  ? You have a red line going up your leg.  Summary   Check your feet every day  for cuts, sores, red spots, swelling, and blisters.   Moisturize feet and legs daily.   Wear shoes that fit properly and have enough cushioning.   If you have foot problems, report any cuts, sores, or bruises to your health care provider immediately.   Schedule a complete foot exam at least once a year (annually) or more often if you have foot problems.  This information is not intended to replace advice given to you by your health care provider. Make sure you discuss any questions you have with your health care provider.  Document Released: 08/04/2000 Document Revised: 09/19/2017 Document Reviewed: 09/08/2016  Elsevier Interactive Patient Education  2019 [employer]

## 2019-01-17 ENCOUNTER — Other Ambulatory Visit: Payer: Self-pay | Admitting: *Deleted

## 2019-01-17 ENCOUNTER — Other Ambulatory Visit: Payer: Self-pay

## 2019-01-17 ENCOUNTER — Inpatient Hospital Stay: Payer: Medicare Other | Attending: Internal Medicine

## 2019-01-17 ENCOUNTER — Telehealth: Payer: Self-pay

## 2019-01-17 ENCOUNTER — Ambulatory Visit (HOSPITAL_COMMUNITY)
Admission: RE | Admit: 2019-01-17 | Discharge: 2019-01-17 | Disposition: A | Discharge status: Home / Self Care | Payer: Medicare Other | Source: Ambulatory Visit | Attending: Internal Medicine | Admitting: Internal Medicine

## 2019-01-17 DIAGNOSIS — C7A8 Other malignant neuroendocrine tumors: Secondary | ICD-10-CM | POA: Diagnosis not present

## 2019-01-17 DIAGNOSIS — C3490 Malignant neoplasm of unspecified part of unspecified bronchus or lung: Secondary | ICD-10-CM | POA: Diagnosis not present

## 2019-01-17 DIAGNOSIS — C220 Liver cell carcinoma: Secondary | ICD-10-CM | POA: Diagnosis not present

## 2019-01-17 DIAGNOSIS — C7B8 Other secondary neuroendocrine tumors: Secondary | ICD-10-CM | POA: Diagnosis not present

## 2019-01-17 LAB — CMP (CANCER CENTER ONLY)
ALT: 24 U/L (ref 0–44)
AST: 19 U/L (ref 15–41)
Albumin: 3.8 g/dL (ref 3.5–5.0)
Alkaline Phosphatase: 95 U/L (ref 38–126)
Anion gap: 12 (ref 5–15)
BUN: 18 mg/dL (ref 8–23)
CO2: 27 mmol/L (ref 22–32)
Calcium: 9.2 mg/dL (ref 8.9–10.3)
Chloride: 102 mmol/L (ref 98–111)
Creatinine: 1.56 mg/dL — ABNORMAL HIGH (ref 0.61–1.24)
GFR, Est AFR Am: 50 mL/min — ABNORMAL LOW (ref >60)
GFR, Estimated: 43 mL/min — ABNORMAL LOW (ref >60)
Glucose, Bld: 203 mg/dL — ABNORMAL HIGH (ref 70–99)
Potassium: 3.4 mmol/L — ABNORMAL LOW (ref 3.5–5.1)
Sodium: 141 mmol/L (ref 135–145)
Total Bilirubin: 0.3 mg/dL (ref 0.3–1.2)
Total Protein: 7.9 g/dL (ref 6.5–8.1)

## 2019-01-17 LAB — CBC WITH DIFFERENTIAL (CANCER CENTER ONLY)
Abs Immature Granulocytes: 0.01 10*3/uL (ref 0.00–0.07)
Basophils Absolute: 0 10*3/uL (ref 0.0–0.1)
Basophils Relative: 0 %
Eosinophils Absolute: 0 10*3/uL (ref 0.0–0.5)
Eosinophils Relative: 0 %
HCT: 34.1 % — ABNORMAL LOW (ref 39.0–52.0)
Hemoglobin: 10.6 g/dL — ABNORMAL LOW (ref 13.0–17.0)
Immature Granulocytes: 0 %
Lymphocytes Relative: 49 %
Lymphs Abs: 2.3 10*3/uL (ref 0.7–4.0)
MCH: 24.5 pg — ABNORMAL LOW (ref 26.0–34.0)
MCHC: 31.1 g/dL (ref 30.0–36.0)
MCV: 78.8 fL — ABNORMAL LOW (ref 80.0–100.0)
Monocytes Absolute: 0.4 10*3/uL (ref 0.1–1.0)
Monocytes Relative: 9 %
Neutro Abs: 2 10*3/uL (ref 1.7–7.7)
Neutrophils Relative %: 42 %
Platelet Count: 172 10*3/uL (ref 150–400)
RBC: 4.33 MIL/uL (ref 4.22–5.81)
RDW: 19.1 % — ABNORMAL HIGH (ref 11.5–15.5)
WBC Count: 4.8 10*3/uL (ref 4.0–10.5)
nRBC: 0 % (ref 0.0–0.2)

## 2019-01-17 MED ORDER — ALBUTEROL SULFATE HFA 108 (90 BASE) MCG/ACT IN AERS: 2.0000 | INHALATION_SPRAY | RESPIRATORY_TRACT | 2 refills | Status: DC | PRN

## 2019-01-17 MED ORDER — AFINITOR 7.5 MG PO TABS: 7.5000 mg | ORAL_TABLET | Freq: Every day | ORAL | 2 refills | Status: DC

## 2019-01-17 NOTE — Telephone Encounter (Signed)
Dr. Mohamed please advise refill. 

## 2019-01-17 NOTE — Telephone Encounter (Signed)
Spoke with [name]. She stated that she was checking on the status of the albuterol refill. Apologized to her because the medication was never sent after his visit on 01/08/19. Advised her that I would send the RX in now, she verbalized understanding.   Nothing further needed at time of call.

## 2019-01-19 ENCOUNTER — Encounter: Payer: Self-pay | Admitting: Podiatry

## 2019-01-19 NOTE — Progress Notes (Signed)
Subjective: Richard Navarro is a 73 y.o. y.o. male who presents for preventative foot care today with h/o diabetes and PAD.  He is seen for elongated, painful, discolored, thick toenails and painful callus/corn which interfere with daily activities. Pain is aggravated when wearing enclosed shoe gear. Pain is relieved with periodic professional debridement.  Richard Olp, MD is his PCP and last visit was 09/24/2018.   Current Outpatient Medications:  .  allopurinol (ZYLOPRIM) 100 MG tablet, Take 1 tablet (100 mg total) by mouth daily., Disp: 90 tablet, Rfl: 3 .  amLODipine (NORVASC) 5 MG tablet, Take 1 tablet (5 mg total) by mouth daily., Disp: 90 tablet, Rfl: 3 .  diclofenac sodium (VOLTAREN) 1 % GEL, APPLY 2 GRAMS TOPICALLY FOUR TIMES A DAY AS NEEDED FOR LEFT SHOULDER ARTHRITIS, Disp: 300 g, Rfl: 3 .  Ferrous Sulfate (IRON) 325 (65 Fe) MG TABS, Take 1 tablet by mouth daily., Disp: , Rfl:  .  glimepiride (AMARYL) 2 MG tablet, TAKE 1 TABLET ONCE DAILY BEFORE BREAKFAST., Disp: 90 tablet, Rfl: 3 .  glucose blood test strip, Use to test blood sugar twice a day, Disp: 200 each, Rfl: 3 .  guaiFENesin (MUCINEX) 600 MG 12 hr tablet, Take by mouth 2 (two) times daily., Disp: , Rfl:  .  loperamide (IMODIUM A-D) 2 MG tablet, Take 1 mg by mouth daily as needed for diarrhea or loose stools., Disp: , Rfl:  .  metFORMIN (GLUCOPHAGE) 500 MG tablet, Take 1 tablet (500 mg total) by mouth 2 (two) times daily with a meal., Disp: 180 tablet, Rfl: 3 .  pantoprazole (PROTONIX) 40 MG tablet, Take 1 tablet (40 mg total) by mouth daily., Disp: 90 tablet, Rfl: 3 .  Tetrahydrozoline HCl (EYE DROPS OP), Place 1 drop into both eyes daily as needed (redness relief)., Disp: , Rfl:  .  Tiotropium Bromide Monohydrate (SPIRIVA RESPIMAT) 2.5 MCG/ACT AERS, Inhale 2 puffs into the lungs daily., Disp: 3 Inhaler, Rfl: 1 .  vitamin B-12 (CYANOCOBALAMIN) 1000 MCG tablet, Take 1,000 mcg by mouth daily., Disp: , Rfl:  .  vitamin C  (ASCORBIC ACID) 500 MG tablet, Take 500 mg by mouth daily., Disp: , Rfl:  .  AFINITOR 7.5 MG tablet, Take 1 tablet (7.5 mg total) by mouth daily., Disp: 90 tablet, Rfl: 2 .  albuterol (VENTOLIN HFA) 108 (90 Base) MCG/ACT inhaler, Inhale 2 puffs into the lungs every 4 (four) hours as needed for wheezing or shortness of breath., Disp: 3 Inhaler, Rfl: 2  Allergies  Allergen Reactions  . Lisinopril Swelling    Angioedema- on this and afinitor same time  . Simvastatin Other (See Comments)    Joint ache    Objective: Vitals:   01/14/19 0908  Temp: 97.7 F (36.5 C)    Vascular Examination: Capillary refill time <3 seconds x 10 digits.  Dorsalis pedis pulses 1/4 b/l.  Posterior tibial pulses absent b/l.  No digital hair x 10 digits.  Skin temperature WNL b/l.  Dermatological Examination: Skin thin, shiny and atrophic b/l.  Toenails 1-5 b/l discolored, thick, dystrophic with subungual debris and pain with palpation to nailbeds due to thickness of nails.  Scab on left 2nd digit from right great toenail abutting skin. No erythema, no edema, no drainage, no flocculence.   Musculoskeletal: Muscle strength 5/5 to all LE muscle groups.  Neurological: Sensation intact with 10 gram monofilament.   Assessment: 1. Painful onychomycosis toenails 1-5 b/l 2. NIDDM with Peripheral arterial disease  Plan: 1. Continue diabetic  foot care principles. Literature dispensed. 2. Toenails 1-5 b/l were debrided in length and girth without iatrogenic bleeding. 3. Dispensed silicone toe sleeves for b/l 2nd toes for protection of adjacent toenails. He is to wear every morning and remove every evening. 4. Patient to continue soft, supportive shoe gear daily. 5. Patient to report any pedal injuries to medical professional immediately. 6. Follow up 3 months.  7. Patient/POA to call should there be a concern in the interim.

## 2019-01-21 ENCOUNTER — Inpatient Hospital Stay: Payer: Medicare Other | Attending: Internal Medicine | Admitting: Internal Medicine

## 2019-01-21 ENCOUNTER — Other Ambulatory Visit: Payer: Self-pay

## 2019-01-21 ENCOUNTER — Encounter: Payer: Self-pay | Admitting: Internal Medicine

## 2019-01-21 VITALS — BP 152/77 | HR 102 | Temp 98.0°F | Resp 18 | Ht 75.0 in | Wt 168.0 lb

## 2019-01-21 DIAGNOSIS — C7B8 Other secondary neuroendocrine tumors: Secondary | ICD-10-CM

## 2019-01-21 DIAGNOSIS — R0609 Other forms of dyspnea: Secondary | ICD-10-CM

## 2019-01-21 DIAGNOSIS — C3491 Malignant neoplasm of unspecified part of right bronchus or lung: Secondary | ICD-10-CM

## 2019-01-21 DIAGNOSIS — R05 Cough: Secondary | ICD-10-CM | POA: Diagnosis not present

## 2019-01-21 DIAGNOSIS — I10 Essential (primary) hypertension: Secondary | ICD-10-CM

## 2019-01-21 DIAGNOSIS — C7A8 Other malignant neuroendocrine tumors: Secondary | ICD-10-CM | POA: Diagnosis not present

## 2019-01-21 DIAGNOSIS — R5383 Other fatigue: Secondary | ICD-10-CM

## 2019-01-21 DIAGNOSIS — Z5111 Encounter for antineoplastic chemotherapy: Secondary | ICD-10-CM
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
## 2019-01-30 ENCOUNTER — Ambulatory Visit (INDEPENDENT_AMBULATORY_CARE_PROVIDER_SITE_OTHER): Payer: Medicare Other | Admitting: Family Medicine

## 2019-01-30 ENCOUNTER — Encounter: Payer: Self-pay | Admitting: Family Medicine

## 2019-01-30 VITALS — BP 154/87 | HR 76

## 2019-01-30 DIAGNOSIS — M1A09X0 Idiopathic chronic gout, multiple sites, without tophus (tophi): Secondary | ICD-10-CM | POA: Diagnosis not present

## 2019-01-30 DIAGNOSIS — I10 Essential (primary) hypertension: Secondary | ICD-10-CM

## 2019-01-30 DIAGNOSIS — E1122 Type 2 diabetes mellitus with diabetic chronic kidney disease: Secondary | ICD-10-CM

## 2019-01-30 DIAGNOSIS — I152 Hypertension secondary to endocrine disorders: Secondary | ICD-10-CM

## 2019-01-30 DIAGNOSIS — N183 Chronic kidney disease, stage 3 unspecified: Secondary | ICD-10-CM

## 2019-01-30 DIAGNOSIS — E1159 Type 2 diabetes mellitus with other circulatory complications: Secondary | ICD-10-CM

## 2019-01-30 MED ORDER — HYDROCHLOROTHIAZIDE 12.5 MG PO CAPS: 12.5000 mg | ORAL_CAPSULE | Freq: Every day | ORAL | 3 refills | Status: DC

## 2019-01-30 MED ORDER — PANTOPRAZOLE SODIUM 40 MG PO TBEC: 40.0000 mg | DELAYED_RELEASE_TABLET | Freq: Every day | ORAL | 3 refills | Status: DC

## 2019-01-30 MED ORDER — GLIMEPIRIDE 4 MG PO TABS: ORAL_TABLET | ORAL | 3 refills | Status: DC

## 2019-01-30 NOTE — Assessment & Plan Note (Signed)
S: hopefully controlled on Glimepiride 2 mg and Metformin  500mg  BID 2mg  CBGs- Not checking regularly d/t callus on fingers. FBG this morning was 215- has been running much better when he did check it. He is letting them try to heal up. Sounds like ranging from 120s to 200s. No low blood sugars.   Exercise and diet- Not watching what he eats right now because he is trying to gain weight. He has been eating more take out and pasta recently. He does splurge and get cheesecake. He has not been exercising recently because he has falt lethargic.  Lab Results  Component Value Date   HGBA1C 6.6 (A) 09/24/2018   HGBA1C 7.8 (A) 03/04/2018   HGBA1C 7.8 11/26/2017  A/P: I am slightly concerned for worsening diabetes control-will increase glimepiride to 4 mg since he denies any low blood sugars-would ideally like to target at least under 8 for A1c.  Offered patient to come by for point-of-care A1c but he would prefer to wait until he sees oncology for labs in August-ordered A1c today and he states he will discuss with them try to get this added on

## 2019-01-30 NOTE — Patient Instructions (Signed)
Health Maintenance Due  Topic Date Due  . FOOT EXAM  11/27/2018    Depression screen [facility] 2/9 01/30/2019 03/04/2018 12/13/2017  Decreased Interest 1 0 0  Down, Depressed, Hopeless 0 0 0  PHQ - 2 Score 1 0 0  Altered sleeping 3 1 -  Tired, decreased energy 2 1 -  Change in appetite 0 0 -  Feeling bad or failure about yourself  0 1 -  Trouble concentrating 1 0 -  Moving slowly or fidgety/restless 0 0 -  Suicidal thoughts 0 0 -  PHQ-9 Score 7 3 -  Difficult doing work/chores Somewhat difficult Somewhat difficult -

## 2019-01-30 NOTE — Progress Notes (Signed)
Phone [phone]   Subjective:  Virtual visit via Video note. Chief complaint: Chief Complaint  Patient presents with  . Follow-up  . Diabetes  . Hypertension  . Gout   This visit type was conducted due to national recommendations for restrictions regarding the COVID-19 Pandemic (e.g. social distancing).  This format is felt to be most appropriate for this patient at this time balancing risks to patient and risks to population by having him in for in person visit.  No physical exam was performed (except for noted visual exam or audio findings with Telehealth visits).    Our team/I connected with Richard Navarro at  3:40 PM EDT by a video enabled telemedicine application (doxy.me or caregility through epic) and verified that I am speaking with the correct person using two identifiers.  Location patient: [place] Location provider: [facility], office Persons participating in the virtual visit:  patient  Our team/I discussed the limitations of evaluation and management by telemedicine and the availability of in person appointments. In light of current covid-19 pandemic, patient also understands that we are trying to protect them by minimizing in office contact if at all possible.  The patient expressed consent for telemedicine visit and agreed to proceed. Patient understands insurance will be billed.   ROS- shortness of breath with activity or if talks for prolonged period. No chest pain. Weight up slightly but not eating well.   Past Medical History-  Patient Active Problem List   Diagnosis Date Noted  . 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 Primary cancer of right lung ([place]) 12/22/2016    Priority: High  . Diabetes mellitus without complication ([place])     Priority: High  . CKD (chronic kidney disease), stage III ([place]) 03/04/2018    Priority: Medium  . Myalgia due to statin 03/04/2018    Priority: Medium  . COPD (chronic obstructive pulmonary disease)  ([place]) 12/01/2016    Priority: Medium  . PTSD (post-traumatic stress disorder) 12/07/2015    Priority: Medium  . Erectile dysfunction 04/09/2014    Priority: Medium  . Former smoker 03/26/2014    Priority: Medium  . Hypertension     Priority: Medium  . Gout     Priority: Medium  . Depression     Priority: Medium  . Hyperlipidemia     Priority: Medium  . Pneumonia 08/09/2017    Priority: Low  . Encounter for antineoplastic chemotherapy 01/10/2017    Priority: Low  . Goals of care, counseling/discussion 01/10/2017    Priority: Low  . Prostate cancer screening 04/17/2014    Priority: Low  . Arthritis 03/26/2014    Priority: Low  . History of adenomatous polyp of colon 03/26/2014    Priority: Low  . GERD (gastroesophageal reflux disease)     Priority: Low  . 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 Tachycardia 08/09/2017  . Urinary frequency 04/27/2017    Medications- reviewed and updated Current Outpatient Medications  Medication Sig Dispense Refill  . AFINITOR 7.5 MG tablet Take 1 tablet (7.5 mg total) by mouth daily. 90 tablet 2  . albuterol (VENTOLIN HFA) 108 (90 Base) MCG/ACT inhaler Inhale 2 puffs into the lungs every 4 (four) hours as needed for wheezing or shortness of breath. 3 Inhaler 2  . allopurinol (ZYLOPRIM) 100 MG tablet Take 1 tablet (100 mg total) by mouth daily. 90 tablet 3  . amLODipine (NORVASC) 5 MG tablet Take 1 tablet (5 mg total) by mouth daily. 90 tablet  3  . diclofenac sodium (VOLTAREN) 1 % GEL APPLY 2 GRAMS TOPICALLY FOUR TIMES A DAY AS NEEDED FOR LEFT SHOULDER ARTHRITIS 300 g 3  . Ferrous Sulfate (IRON) 325 (65 Fe) MG TABS Take 1 tablet by mouth daily.    Marland Kitchen glimepiride (AMARYL) 2 MG tablet TAKE 1 TABLET ONCE DAILY BEFORE BREAKFAST. 90 tablet 3  . glucose blood test strip Use to test blood sugar twice a day 200 each 3  . guaiFENesin (MUCINEX) 600 MG 12 hr tablet Take by mouth 2 (two) times daily.    Marland Kitchen loperamide (IMODIUM A-D) 2 MG tablet Take 1  mg by mouth daily as needed for diarrhea or loose stools.    . metFORMIN (GLUCOPHAGE) 500 MG tablet Take 1 tablet (500 mg total) by mouth 2 (two) times daily with a meal. 180 tablet 3  . pantoprazole (PROTONIX) 40 MG tablet Take 1 tablet (40 mg total) by mouth daily. 90 tablet 3  . Tetrahydrozoline HCl (EYE DROPS OP) Place 1 drop into both eyes daily as needed (redness relief).    . Tiotropium Bromide Monohydrate (SPIRIVA RESPIMAT) 2.5 MCG/ACT AERS Inhale 2 puffs into the lungs daily. 3 Inhaler 1  . vitamin B-12 (CYANOCOBALAMIN) 1000 MCG tablet Take 1,000 mcg by mouth daily. Every other day    . vitamin C (ASCORBIC ACID) 500 MG tablet Take 500 mg by mouth daily.     No current facility-administered medications for this visit.      Objective:  BP (!) 154/87   Pulse 76  self reported vitals Gen: NAD, resting comfortably Lungs: nonlabored, normal respiratory rate  Skin: appears dry, no obvious rash     Assessment and Plan   # Diabetes S: hopefully controlled on Glimepiride 2 mg and Metformin  500mg  BID 2mg  CBGs- Not checking regularly d/t callus on fingers. FBG this morning was 215- has been running much better when he did check it. He is letting them try to heal up. Sounds like ranging from 120s to 200s. No low blood sugars.   Exercise and diet- Not watching what he eats right now because he is trying to gain weight. He has been eating more take out and pasta recently. He does splurge and get cheesecake. He has not been exercising recently because he has falt lethargic.  Lab Results  Component Value Date   HGBA1C 6.6 (A) 09/24/2018   HGBA1C 7.8 (A) 03/04/2018   HGBA1C 7.8 11/26/2017  A/P: I am slightly concerned for worsening diabetes control-will increase glimepiride to 4 mg since he denies any low blood sugars-would ideally like to target at least under 8 for A1c.  Offered patient to come by for point-of-care A1c but he would prefer to wait until he sees oncology for labs in  August-ordered A1c today and he states he will discuss with them try to get this added on  #hypertension S: controlled on Amlodipine 5mg  previously but appears to be trending up overall. Checking BP at home regularly. BP has been 140s/70s. Denies HA, dizziness. He reports occasional visual changes when first getting up in the morning.  BP Readings from Last 3 Encounters:  01/30/19 (!) 154/87  01/21/19 (!) 152/77  01/08/19 (!) 146/78  A/P: Poor control of hypertension with amlodipine 5 mg alone.  Has had issues with edema so hesitant to increase dose.  We will add  hctz 12.5 mg -mychart update in 3 weeks-he agrees to send me a log of blood pressures -Cannot use ACE or arm given prior  angioedema on ACE inhibitor  #Gout S: 0 flares in last 6 months.  Lab Results  Component Value Date   LABURIC 4.8 03/04/2018  A/P: Stable. Continue current medications.  We reviewed the fact that hydrochlorothiazide can slightly increase gout risk  #CKD stage III- I reviewed kidney function from May labs- GFR was 50 at that time.  Patient should avoid NSAIDs.  We will continue to try to control blood pressure.  Appears stable-continue to monitor  Advised to follow-up after A1c- after that would recommend at least every 4-month Future Appointments  Date Time Provider [place]  03/24/2019 10:15 AM [facility] ONLY [facility] None  03/24/2019 10:45 AM Curt Bears, MD [facility] None  04/16/2019  9:15 AM Marzetta Board, DPM [facility]   Lab/Order associations:   ICD-10-CM   1. Type 2 diabetes mellitus with stage 3 chronic kidney disease, without long-term current use of insulin (HCC)  E11.22 Hemoglobin A1c   N18.3   2. Hypertension associated with diabetes ([place])  E11.59    I10   3. Idiopathic chronic gout of multiple sites without tophus  M1A.09X0   4. CKD (chronic kidney disease), stage III (HCC)  N18.3     Meds ordered this encounter  Medications  . pantoprazole (PROTONIX)  40 MG tablet    Sig: Take 1 tablet (40 mg total) by mouth daily.    Dispense:  90 tablet    Refill:  3  . glimepiride (AMARYL) 4 MG tablet    Sig: TAKE 1 TABLET ONCE DAILY BEFORE BREAKFAST.    Dispense:  90 tablet    Refill:  3  . hydrochlorothiazide (MICROZIDE) 12.5 MG capsule    Sig: Take 1 capsule (12.5 mg total) by mouth daily.    Dispense:  90 capsule    Refill:  3    Return precautions advised.  [name], MD

## 2019-01-30 NOTE — Assessment & Plan Note (Signed)
S: controlled on Amlodipine 5mg  previously but appears to be trending up overall. Checking BP at home regularly. BP has been 140s/70s. Denies HA, dizziness. He reports occasional visual changes when first getting up in the morning.  BP Readings from Last 3 Encounters:  01/30/19 (!) 154/87  01/21/19 (!) 152/77  01/08/19 (!) 146/78  A/P: Poor control of hypertension with amlodipine 5 mg alone.  Has had issues with edema so hesitant to increase dose.  We will add  hctz 12.5 mg -mychart update in 3 weeks-he agrees to send me a log of blood pressures -Cannot use ACE or arm given prior angioedema on ACE inhibitor

## 2019-02-07 ENCOUNTER — Telehealth: Payer: Self-pay | Admitting: Emergency Medicine

## 2019-02-07 MED ORDER — SPIRIVA RESPIMAT 2.5 MCG/ACT IN AERS: 2.0000 | INHALATION_SPRAY | Freq: Every day | RESPIRATORY_TRACT | 1 refills | Status: DC

## 2019-02-07 NOTE — Telephone Encounter (Signed)
Called and spoke to pt. Verified that pt would like Spiriva Respimat sent to [facility], pt confirmed, rx has been sent. Pt verbalized understanding and denied any further questions or concerns at this time.

## 2019-02-18 ENCOUNTER — Ambulatory Visit (INDEPENDENT_AMBULATORY_CARE_PROVIDER_SITE_OTHER): Payer: Medicare Other

## 2019-02-18 DIAGNOSIS — E1122 Type 2 diabetes mellitus with diabetic chronic kidney disease: Secondary | ICD-10-CM

## 2019-02-18 DIAGNOSIS — N183 Chronic kidney disease, stage 3 unspecified: Secondary | ICD-10-CM

## 2019-02-18 LAB — POCT GLYCOSYLATED HEMOGLOBIN (HGB A1C): Hemoglobin A1C: 8.5 % — AB (ref 4.0–5.6)

## 2019-02-18 NOTE — Progress Notes (Signed)
Results to Dr. Yong Channel to review.

## 2019-02-18 NOTE — Addendum Note (Signed)
Addended by: Jasper Loser on: 02/18/2019 06:01 PM   Modules accepted: Orders

## 2019-02-19 ENCOUNTER — Ambulatory Visit: Payer: Medicare Other | Admitting: Emergency Medicine

## 2019-02-19 MED ORDER — FREESTYLE LIBRE 14 DAY READER DEVI: 1.0000 | Freq: Once | 0 refills | Status: AC

## 2019-02-19 MED ORDER — FREESTYLE LIBRE 14 DAY SENSOR MISC: 1.0000 | Freq: Once | 0 refills | Status: AC

## 2019-02-19 NOTE — Addendum Note (Signed)
Addended by: Jasper Loser on: 02/19/2019 09:16 AM   Modules accepted: Orders

## 2019-02-20 ENCOUNTER — Telehealth: Payer: Self-pay

## 2019-02-20 NOTE — Telephone Encounter (Signed)
Freestyle Libre sensor and reader  [place] [id number] ID [id number]

## 2019-02-20 NOTE — Telephone Encounter (Signed)
Additional Information Required This medication may be excluded from the patient's benefit. For more information, please reach out to [facility] directly at [phone]. Message from [facility]: Drug is not covered by plan

## 2019-02-26 ENCOUNTER — Other Ambulatory Visit: Payer: Self-pay | Admitting: Interventional Radiology

## 2019-02-26 ENCOUNTER — Other Ambulatory Visit: Payer: Self-pay | Admitting: *Deleted

## 2019-02-26 DIAGNOSIS — C7A8 Other malignant neuroendocrine tumors: Secondary | ICD-10-CM

## 2019-02-26 DIAGNOSIS — C7B8 Other secondary neuroendocrine tumors: Secondary | ICD-10-CM

## 2019-02-27 DIAGNOSIS — C7A8 Other malignant neuroendocrine tumors: Secondary | ICD-10-CM | POA: Diagnosis not present

## 2019-02-27 DIAGNOSIS — C7B8 Other secondary neuroendocrine tumors: Secondary | ICD-10-CM | POA: Diagnosis not present

## 2019-02-28 LAB — COMPLETE METABOLIC PANEL WITH GFR
AG Ratio: 1.4 (calc) (ref 1.0–2.5)
ALT: 15 U/L (ref 9–46)
AST: 16 U/L (ref 10–35)
Albumin: 4.1 g/dL (ref 3.6–5.1)
Alkaline phosphatase (APISO): 70 U/L (ref 35–144)
BUN/Creatinine Ratio: 14 (calc) (ref 6–22)
BUN: 21 mg/dL (ref 7–25)
CO2: 30 mmol/L (ref 20–32)
Calcium: 9.3 mg/dL (ref 8.6–10.3)
Chloride: 102 mmol/L (ref 98–110)
Creat: 1.54 mg/dL — ABNORMAL HIGH (ref 0.70–1.18)
GFR, Est African American: 51 mL/min/{1.73_m2} — ABNORMAL LOW (ref >=60)
GFR, Est Non African American: 44 mL/min/{1.73_m2} — ABNORMAL LOW (ref >=60)
Globulin: 2.9 g/dL (calc) (ref 1.9–3.7)
Glucose, Bld: 187 mg/dL — ABNORMAL HIGH (ref 65–99)
Potassium: 3.5 mmol/L (ref 3.5–5.3)
Sodium: 140 mmol/L (ref 135–146)
Total Bilirubin: 0.3 mg/dL (ref 0.2–1.2)
Total Protein: 7 g/dL (ref 6.1–8.1)

## 2019-02-28 LAB — PROTIME-INR
INR: 0.9
Prothrombin Time: 9.8 s (ref 9.0–11.5)

## 2019-02-28 LAB — CBC
HCT: 33.9 % — ABNORMAL LOW (ref 38.5–50.0)
Hemoglobin: 10.9 g/dL — ABNORMAL LOW (ref 13.2–17.1)
MCH: 25.6 pg — ABNORMAL LOW (ref 27.0–33.0)
MCHC: 32.2 g/dL (ref 32.0–36.0)
MCV: 79.8 fL — ABNORMAL LOW (ref 80.0–100.0)
MPV: 11.1 fL (ref 7.5–12.5)
Platelets: 156 10*3/uL (ref 140–400)
RBC: 4.25 10*6/uL (ref 4.20–5.80)
RDW: 17.3 % — ABNORMAL HIGH (ref 11.0–15.0)
WBC: 4.6 10*3/uL (ref 3.8–10.8)

## 2019-03-03 ENCOUNTER — Other Ambulatory Visit: Payer: Self-pay

## 2019-03-03 ENCOUNTER — Ambulatory Visit (HOSPITAL_COMMUNITY)
Admission: RE | Admit: 2019-03-03 | Discharge: 2019-03-03 | Disposition: A | Discharge status: Home / Self Care | Payer: Medicare Other | Source: Ambulatory Visit | Attending: Interventional Radiology | Admitting: Interventional Radiology

## 2019-03-03 DIAGNOSIS — C7B8 Other secondary neuroendocrine tumors: Secondary | ICD-10-CM | POA: Diagnosis not present

## 2019-03-03 DIAGNOSIS — C7A8 Other malignant neuroendocrine tumors: Secondary | ICD-10-CM | POA: Diagnosis not present

## 2019-03-03 DIAGNOSIS — D3A090 Benign carcinoid tumor of the bronchus and lung: Secondary | ICD-10-CM | POA: Diagnosis not present

## 2019-03-03 DIAGNOSIS — C229 Malignant neoplasm of liver, not specified as primary or secondary: Secondary | ICD-10-CM | POA: Diagnosis not present

## 2019-03-03 MED ORDER — GADOBUTROL 1 MMOL/ML IV SOLN
10.0000 mL | Freq: Once | INTRAVENOUS | Status: AC | PRN
  Administered 2019-03-03: 8 mL via INTRAVENOUS

## 2019-03-04 ENCOUNTER — Ambulatory Visit
Admission: RE | Admit: 2019-03-04 | Discharge: 2019-03-04 | Disposition: A | Discharge status: Home / Self Care | Payer: Medicare Other | Source: Ambulatory Visit | Attending: Interventional Radiology | Admitting: Interventional Radiology

## 2019-03-04 ENCOUNTER — Encounter: Payer: Self-pay | Admitting: *Deleted

## 2019-03-04 DIAGNOSIS — C7B02 Secondary carcinoid tumors of liver: Secondary | ICD-10-CM | POA: Diagnosis not present

## 2019-03-04 DIAGNOSIS — C7A8 Other malignant neuroendocrine tumors: Secondary | ICD-10-CM

## 2019-03-04 DIAGNOSIS — C7A090 Malignant carcinoid tumor of the bronchus and lung: Secondary | ICD-10-CM | POA: Diagnosis not present

## 2019-03-04 DIAGNOSIS — C7B8 Other secondary neuroendocrine tumors: Secondary | ICD-10-CM

## 2019-03-04 HISTORY — PX: IR RADIOLOGIST EVAL & MGMT: IMG5224
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
## 2019-03-07 ENCOUNTER — Telehealth: Payer: Self-pay | Admitting: Family Medicine

## 2019-03-07 NOTE — Telephone Encounter (Signed)
I received a letter from patient's ex wife who helps him manage his illnesses that he is really enjoying his freestyle libre system.  He is taking metformin twice a day and glimepiride 4 mg once a day.  He is not increased food intake or started eating less healthy foods.  Blood sugars range from a low of 128 to a high of 276- they do not note a clear correlation with food.  Fasting sugars over 7 days were 222, 276, 143, 188, 238, 146, 160  Metformin is at max dose of 500 mg twice a day based off filtration rate in the 50s.  We could try to increase his glimepiride to 8 mg- you can send this in if he would like.  We could also leave current oral medicines as is and consider low-dose long-acting insulin such as Lantus or Basaglar  5 units.  Let me know if he prefers this and I can send this in.  I would like you listed as 5 to 10 units/day but start out with just 5 units with goal to get fasting blood sugars closer to 100

## 2019-03-10 NOTE — Telephone Encounter (Signed)
Left message to return phone call.

## 2019-03-12 NOTE — Telephone Encounter (Signed)
Overall blood pressure looks pretty good-I do not love the elevated numbers in last 2 days but overall average looks okay-lets continue current medications

## 2019-03-12 NOTE — Telephone Encounter (Signed)
Noted!   Also spoke to pt regarding blood sugar readings. Pt stated that he takes his Rx as prescribed and think that his Ex-wife is getting " a little anxious" Pt stated he want to wait and see if the numbers are still elevated in 2 weeks. If so, pt will then increase Rx dosage.   No further action needed will contact pt Ex-wife for update of Bp readings.

## 2019-03-12 NOTE — Telephone Encounter (Signed)
Pt ex-wife came into the office today and dropped off pot Bp readings.   02/21/2019 124/72  02/22/2019 132/75  7/5 154/95  7/6 141/79   141/79  Please advise on Bp readings

## 2019-03-12 NOTE — Telephone Encounter (Signed)
Called pt 2x to give an update of Prior Auth status. Received a Prior Auth request for the [employer]. Called [phone] but no response. Will try again.

## 2019-03-12 NOTE — Telephone Encounter (Signed)
Called ex-wife no answer. Leaving for home will call again tomorrow for update since non urgent.

## 2019-03-12 NOTE — Telephone Encounter (Signed)
Left message to return phone call.

## 2019-03-21 NOTE — Telephone Encounter (Signed)
Pt still claims he is fine giving the Rx a chance to "kick in". Pt stated he is fine and will call back if anything changes.

## 2019-03-24 ENCOUNTER — Inpatient Hospital Stay: Payer: Medicare Other | Attending: Internal Medicine | Admitting: Internal Medicine

## 2019-03-24 ENCOUNTER — Encounter: Payer: Self-pay | Admitting: Internal Medicine

## 2019-03-24 ENCOUNTER — Other Ambulatory Visit: Payer: Self-pay

## 2019-03-24 ENCOUNTER — Inpatient Hospital Stay: Payer: Medicare Other

## 2019-03-24 ENCOUNTER — Telehealth: Payer: Self-pay | Admitting: Internal Medicine

## 2019-03-24 VITALS — BP 152/77 | HR 87 | Temp 98.2°F | Resp 18 | Ht 75.0 in | Wt 165.1 lb

## 2019-03-24 DIAGNOSIS — C3491 Malignant neoplasm of unspecified part of right bronchus or lung: Secondary | ICD-10-CM | POA: Diagnosis not present

## 2019-03-24 DIAGNOSIS — D630 Anemia in neoplastic disease: Secondary | ICD-10-CM

## 2019-03-24 DIAGNOSIS — N189 Chronic kidney disease, unspecified: Secondary | ICD-10-CM | POA: Diagnosis not present

## 2019-03-24 DIAGNOSIS — C3490 Malignant neoplasm of unspecified part of unspecified bronchus or lung: Secondary | ICD-10-CM | POA: Diagnosis not present

## 2019-03-24 DIAGNOSIS — C7A8 Other malignant neuroendocrine tumors: Secondary | ICD-10-CM | POA: Diagnosis not present

## 2019-03-24 DIAGNOSIS — C7B02 Secondary carcinoid tumors of liver: Secondary | ICD-10-CM | POA: Diagnosis not present

## 2019-03-24 DIAGNOSIS — Z791 Long term (current) use of non-steroidal anti-inflammatories (NSAID): Secondary | ICD-10-CM | POA: Insufficient documentation

## 2019-03-24 DIAGNOSIS — E119 Type 2 diabetes mellitus without complications: Secondary | ICD-10-CM | POA: Diagnosis not present

## 2019-03-24 DIAGNOSIS — Z79899 Other long term (current) drug therapy: Secondary | ICD-10-CM | POA: Insufficient documentation

## 2019-03-24 DIAGNOSIS — J439 Emphysema, unspecified: Secondary | ICD-10-CM | POA: Insufficient documentation

## 2019-03-24 DIAGNOSIS — Z7984 Long term (current) use of oral hypoglycemic drugs: Secondary | ICD-10-CM | POA: Insufficient documentation

## 2019-03-24 DIAGNOSIS — C7A090 Malignant carcinoid tumor of the bronchus and lung: Secondary | ICD-10-CM | POA: Diagnosis not present

## 2019-03-24 DIAGNOSIS — F329 Major depressive disorder, single episode, unspecified: Secondary | ICD-10-CM | POA: Insufficient documentation

## 2019-03-24 DIAGNOSIS — E785 Hyperlipidemia, unspecified: Secondary | ICD-10-CM | POA: Insufficient documentation

## 2019-03-24 DIAGNOSIS — Z5111 Encounter for antineoplastic chemotherapy: Secondary | ICD-10-CM | POA: Diagnosis not present

## 2019-03-24 DIAGNOSIS — I10 Essential (primary) hypertension: Secondary | ICD-10-CM | POA: Diagnosis not present

## 2019-03-24 LAB — CMP (CANCER CENTER ONLY)
ALT: 20 U/L (ref 0–44)
AST: 19 U/L (ref 15–41)
Albumin: 3.7 g/dL (ref 3.5–5.0)
Alkaline Phosphatase: 69 U/L (ref 38–126)
Anion gap: 10 (ref 5–15)
BUN: 24 mg/dL — ABNORMAL HIGH (ref 8–23)
CO2: 27 mmol/L (ref 22–32)
Calcium: 9.1 mg/dL (ref 8.9–10.3)
Chloride: 104 mmol/L (ref 98–111)
Creatinine: 1.61 mg/dL — ABNORMAL HIGH (ref 0.61–1.24)
GFR, Est AFR Am: 48 mL/min — ABNORMAL LOW (ref >60)
GFR, Estimated: 41 mL/min — ABNORMAL LOW (ref >60)
Glucose, Bld: 162 mg/dL — ABNORMAL HIGH (ref 70–99)
Potassium: 3.2 mmol/L — ABNORMAL LOW (ref 3.5–5.1)
Sodium: 141 mmol/L (ref 135–145)
Total Bilirubin: 0.3 mg/dL (ref 0.3–1.2)
Total Protein: 7.6 g/dL (ref 6.5–8.1)

## 2019-03-24 LAB — CBC WITH DIFFERENTIAL (CANCER CENTER ONLY)
Abs Immature Granulocytes: 0 10*3/uL (ref 0.00–0.07)
Basophils Absolute: 0 10*3/uL (ref 0.0–0.1)
Basophils Relative: 1 %
Eosinophils Absolute: 0 10*3/uL (ref 0.0–0.5)
Eosinophils Relative: 1 %
HCT: 32.5 % — ABNORMAL LOW (ref 39.0–52.0)
Hemoglobin: 10.8 g/dL — ABNORMAL LOW (ref 13.0–17.0)
Immature Granulocytes: 0 %
Lymphocytes Relative: 42 %
Lymphs Abs: 1.6 10*3/uL (ref 0.7–4.0)
MCH: 26.1 pg (ref 26.0–34.0)
MCHC: 33.2 g/dL (ref 30.0–36.0)
MCV: 78.5 fL — ABNORMAL LOW (ref 80.0–100.0)
Monocytes Absolute: 0.3 10*3/uL (ref 0.1–1.0)
Monocytes Relative: 8 %
Neutro Abs: 1.9 10*3/uL (ref 1.7–7.7)
Neutrophils Relative %: 48 %
Platelet Count: 133 10*3/uL — ABNORMAL LOW (ref 150–400)
RBC: 4.14 MIL/uL — ABNORMAL LOW (ref 4.22–5.81)
RDW: 15.7 % — ABNORMAL HIGH (ref 11.5–15.5)
WBC Count: 3.9 10*3/uL — ABNORMAL LOW (ref 4.0–10.5)
nRBC: 0 % (ref 0.0–0.2)
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
## 2019-03-24 NOTE — Telephone Encounter (Signed)
Scheduled appt per 8/3 los.  Printed calendar and avs.  Gave the patient contrast.

## 2019-04-08 ENCOUNTER — Other Ambulatory Visit: Payer: Self-pay

## 2019-04-08 ENCOUNTER — Ambulatory Visit (INDEPENDENT_AMBULATORY_CARE_PROVIDER_SITE_OTHER): Payer: Medicare Other

## 2019-04-08 VITALS — BP 136/72 | Temp 98.7°F | Ht 75.0 in | Wt 168.6 lb

## 2019-04-08 DIAGNOSIS — Z0000 Encounter for general adult medical examination without abnormal findings: Secondary | ICD-10-CM | POA: Diagnosis not present
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
## 2019-04-08 NOTE — Progress Notes (Signed)
I have reviewed and agree with note, evaluation, plan.   Chrysten Woulfe, MD  

## 2019-04-08 NOTE — Patient Instructions (Addendum)
Mr. Diskin , Thank you for taking time to come for your Medicare Wellness Visit. I appreciate your ongoing commitment to your health goals. Please review the following plan we discussed and let me know if I can assist you in the future.   Screening recommendations/referrals: Colorectal Screening: up to date; last 05/2009  Vision and Dental Exams: Recommended annual ophthalmology exams for early detection of glaucoma and other disorders of the eye Recommended annual dental exams for proper oral hygiene  Diabetic Exams: Diabetic Eye Exam: will request records  Diabetic Foot Exam: next office visit   Vaccinations: Influenza vaccine:  recommended this fall either at PCP office or through your local pharmacy  Pneumococcal vaccine: up to date  Tdap vaccine: last 03/28/09; needed 2020  Shingles vaccine: Please call your insurance company to determine your out of pocket expense for the Shingrix vaccine. You may receive this vaccine at your local pharmacy.  Advanced directives:  Please bring a copy of your POA (Power of Attorney) and/or Living Will to your next appointment.  Goals: Recommend to drink at least 6-8 8oz glasses of water per day.  Next appointment: Please schedule your Annual Wellness Visit with your Nurse Health Advisor in one year.  Preventive Care 53 Years and Older, Male Preventive care refers to lifestyle choices and visits with your health care provider that can promote health and wellness. What does preventive care include?  A yearly physical exam. This is also called an annual well check.  Dental exams once or twice a year.  Routine eye exams. Ask your health care provider how often you should have your eyes checked.  Personal lifestyle choices, including:  Daily care of your teeth and gums.  Regular physical activity.  Eating a healthy diet.  Avoiding tobacco and drug use.  Limiting alcohol use.  Practicing safe sex.  Taking low doses of aspirin every  day if recommended by your health care provider..  Taking vitamin and mineral supplements as recommended by your health care provider. What happens during an annual well check? The services and screenings done by your health care provider during your annual well check will depend on your age, overall health, lifestyle risk factors, and family history of disease. Counseling  Your health care provider may ask you questions about your:  Alcohol use.  Tobacco use.  Drug use.  Emotional well-being.  Home and relationship well-being.  Sexual activity.  Eating habits.  History of falls.  Memory and ability to understand (cognition).  Work and work Statistician. Screening  You may have the following tests or measurements:  Height, weight, and BMI.  Blood pressure.  Lipid and cholesterol levels. These may be checked every 5 years, or more frequently if you are over 41 years old.  Skin check.  Lung cancer screening. You may have this screening every year starting at age 87 if you have a 30-pack-year history of smoking and currently smoke or have quit within the past 15 years.  Fecal occult blood test (FOBT) of the stool. You may have this test every year starting at age 64.  Flexible sigmoidoscopy or colonoscopy. You may have a sigmoidoscopy every 5 years or a colonoscopy every 10 years starting at age 19.  Prostate cancer screening. Recommendations will vary depending on your family history and other risks.  Hepatitis C blood test.  Hepatitis B blood test.  Sexually transmitted disease (STD) testing.  Diabetes screening. This is done by checking your blood sugar (glucose) after you have not eaten  for a while (fasting). You may have this done every 1-3 years.  Abdominal aortic aneurysm (AAA) screening. You may need this if you are a current or former smoker.  Osteoporosis. You may be screened starting at age 107 if you are at high risk. Talk with your health care provider  about your test results, treatment options, and if necessary, the need for more tests. Vaccines  Your health care provider may recommend certain vaccines, such as:  Influenza vaccine. This is recommended every year.  Tetanus, diphtheria, and acellular pertussis (Tdap, Td) vaccine. You may need a Td booster every 10 years.  Zoster vaccine. You may need this after age 27.  Pneumococcal 13-valent conjugate (PCV13) vaccine. One dose is recommended after age 4.  Pneumococcal polysaccharide (PPSV23) vaccine. One dose is recommended after age 52. Talk to your health care provider about which screenings and vaccines you need and how often you need them. This information is not intended to replace advice given to you by your health care provider. Make sure you discuss any questions you have with your health care provider. Document Released: 09/03/2015 Document Revised: 04/26/2016 Document Reviewed: 06/08/2015 Elsevier Interactive Patient Education  2017 [employer] Prevention in the Home Falls can cause injuries. They can happen to people of all ages. There are many things you can do to make your home safe and to help prevent falls. What can I do on the outside of my home?  Regularly fix the edges of walkways and driveways and fix any cracks.  Remove anything that might make you trip as you walk through a door, such as a raised step or threshold.  Trim any bushes or trees on the path to your home.  Use bright outdoor lighting.  Clear any walking paths of anything that might make someone trip, such as rocks or tools.  Regularly check to see if handrails are loose or broken. Make sure that both sides of any steps have handrails.  Any raised decks and porches should have guardrails on the edges.  Have any leaves, snow, or ice cleared regularly.  Use sand or salt on walking paths during winter.  Clean up any spills in your garage right away. This includes oil or grease spills.  What can I do in the bathroom?  Use night lights.  Install grab bars by the toilet and in the tub and shower. Do not use towel bars as grab bars.  Use non-skid mats or decals in the tub or shower.  If you need to sit down in the shower, use a plastic, non-slip stool.  Keep the floor dry. Clean up any water that spills on the floor as soon as it happens.  Remove soap buildup in the tub or shower regularly.  Attach bath mats securely with double-sided non-slip rug tape.  Do not have throw rugs and other things on the floor that can make you trip. What can I do in the bedroom?  Use night lights.  Make sure that you have a light by your bed that is easy to reach.  Do not use any sheets or blankets that are too big for your bed. They should not hang down onto the floor.  Have a firm chair that has side arms. You can use this for support while you get dressed.  Do not have throw rugs and other things on the floor that can make you trip. What can I do in the kitchen?  Clean up any spills right  away.  Avoid walking on wet floors.  Keep items that you use a lot in easy-to-reach places.  If you need to reach something above you, use a strong step stool that has a grab bar.  Keep electrical cords out of the way.  Do not use floor polish or wax that makes floors slippery. If you must use wax, use non-skid floor wax.  Do not have throw rugs and other things on the floor that can make you trip. What can I do with my stairs?  Do not leave any items on the stairs.  Make sure that there are handrails on both sides of the stairs and use them. Fix handrails that are broken or loose. Make sure that handrails are as long as the stairways.  Check any carpeting to make sure that it is firmly attached to the stairs. Fix any carpet that is loose or worn.  Avoid having throw rugs at the top or bottom of the stairs. If you do have throw rugs, attach them to the floor with carpet tape.   Make sure that you have a light switch at the top of the stairs and the bottom of the stairs. If you do not have them, ask someone to add them for you. What else can I do to help prevent falls?  Wear shoes that:  Do not have high heels.  Have rubber bottoms.  Are comfortable and fit you well.  Are closed at the toe. Do not wear sandals.  If you use a stepladder:  Make sure that it is fully opened. Do not climb a closed stepladder.  Make sure that both sides of the stepladder are locked into place.  Ask someone to hold it for you, if possible.  Clearly mark and make sure that you can see:  Any grab bars or handrails.  First and last steps.  Where the edge of each step is.  Use tools that help you move around (mobility aids) if they are needed. These include:  Canes.  Walkers.  Scooters.  Crutches.  Turn on the lights when you go into a dark area. Replace any light bulbs as soon as they burn out.  Set up your furniture so you have a clear path. Avoid moving your furniture around.  If any of your floors are uneven, fix them.  If there are any pets around you, be aware of where they are.  Review your medicines with your doctor. Some medicines can make you feel dizzy. This can increase your chance of falling. Ask your doctor what other things that you can do to help prevent falls. This information is not intended to replace advice given to you by your health care provider. Make sure you discuss any questions you have with your health care provider. Document Released: 06/03/2009 Document Revised: 01/13/2016 Document Reviewed: 09/11/2014 Elsevier Interactive Patient Education  2017 [employer].

## 2019-04-14 ENCOUNTER — Other Ambulatory Visit: Payer: Self-pay | Admitting: Family Medicine

## 2019-04-16 ENCOUNTER — Other Ambulatory Visit: Payer: Self-pay

## 2019-04-16 ENCOUNTER — Encounter: Payer: Self-pay | Admitting: Podiatry

## 2019-04-16 ENCOUNTER — Ambulatory Visit (INDEPENDENT_AMBULATORY_CARE_PROVIDER_SITE_OTHER): Payer: Medicare Other | Admitting: Podiatry

## 2019-04-16 DIAGNOSIS — M79675 Pain in left toe(s): Secondary | ICD-10-CM | POA: Diagnosis not present

## 2019-04-16 DIAGNOSIS — M79674 Pain in right toe(s): Secondary | ICD-10-CM | POA: Diagnosis not present

## 2019-04-16 DIAGNOSIS — B351 Tinea unguium: Secondary | ICD-10-CM | POA: Diagnosis not present

## 2019-04-16 DIAGNOSIS — E1151 Type 2 diabetes mellitus with diabetic peripheral angiopathy without gangrene: Secondary | ICD-10-CM

## 2019-04-16 NOTE — Patient Instructions (Signed)
Diabetes Mellitus and Foot Care Foot care is an important part of your health, especially when you have diabetes. Diabetes may cause you to have problems because of poor blood flow (circulation) to your feet and legs, which can cause your skin to:  Become thinner and drier.  Break more easily.  Heal more slowly.  Peel and crack. You may also have nerve damage (neuropathy) in your legs and feet, causing decreased feeling in them. This means that you may not notice minor injuries to your feet that could lead to more serious problems. Noticing and addressing any potential problems early is the best way to prevent future foot problems. How to care for your feet Foot hygiene  Wash your feet daily with warm water and mild soap. Do not use hot water. Then, pat your feet and the areas between your toes until they are completely dry. Do not soak your feet as this can dry your skin.  Trim your toenails straight across. Do not dig under them or around the cuticle. File the edges of your nails with an emery board or nail file.  Apply a moisturizing lotion or petroleum jelly to the skin on your feet and to dry, brittle toenails. Use lotion that does not contain alcohol and is unscented. Do not apply lotion between your toes. Shoes and socks  Wear clean socks or stockings every day. Make sure they are not too tight. Do not wear knee-high stockings since they may decrease blood flow to your legs.  Wear shoes that fit properly and have enough cushioning. Always look in your shoes before you put them on to be sure there are no objects inside.  To break in new shoes, wear them for just a few hours a day. This prevents injuries on your feet. Wounds, scrapes, corns, and calluses  Check your feet daily for blisters, cuts, bruises, sores, and redness. If you cannot see the bottom of your feet, use a mirror or ask someone for help.  Do not cut corns or calluses or try to remove them with medicine.  If you  find a minor scrape, cut, or break in the skin on your feet, keep it and the skin around it clean and dry. You may clean these areas with mild soap and water. Do not clean the area with peroxide, alcohol, or iodine.  If you have a wound, scrape, corn, or callus on your foot, look at it several times a day to make sure it is healing and not infected. Check for: ? Redness, swelling, or pain. ? Fluid or blood. ? Warmth. ? Pus or a bad smell. General instructions  Do not cross your legs. This may decrease blood flow to your feet.  Do not use heating pads or hot water bottles on your feet. They may burn your skin. If you have lost feeling in your feet or legs, you may not know this is happening until it is too late.  Protect your feet from hot and cold by wearing shoes, such as at the beach or on hot pavement.  Schedule a complete foot exam at least once a year (annually) or more often if you have foot problems. If you have foot problems, report any cuts, sores, or bruises to your health care provider immediately. Contact a health care provider if:  You have a medical condition that increases your risk of infection and you have any cuts, sores, or bruises on your feet.  You have an injury that is not   healing.  You have redness on your legs or feet.  You feel burning or tingling in your legs or feet.  You have pain or cramps in your legs and feet.  Your legs or feet are numb.  Your feet always feel cold.  You have pain around a toenail. Get help right away if:  You have a wound, scrape, corn, or callus on your foot and: ? You have pain, swelling, or redness that gets worse. ? You have fluid or blood coming from the wound, scrape, corn, or callus. ? Your wound, scrape, corn, or callus feels warm to the touch. ? You have pus or a bad smell coming from the wound, scrape, corn, or callus. ? You have a fever. ? You have a red line going up your leg. Summary  Check your feet every day  for cuts, sores, red spots, swelling, and blisters.  Moisturize feet and legs daily.  Wear shoes that fit properly and have enough cushioning.  If you have foot problems, report any cuts, sores, or bruises to your health care provider immediately.  Schedule a complete foot exam at least once a year (annually) or more often if you have foot problems. This information is not intended to replace advice given to you by your health care provider. Make sure you discuss any questions you have with your health care provider. Document Released: 08/04/2000 Document Revised: 09/19/2017 Document Reviewed: 09/08/2016 Elsevier Patient Education  2020 [employer]   Onychomycosis/Fungal Toenails  WHAT IS IT? An infection that lies within the keratin of your nail plate that is caused by a fungus.  WHY ME? Fungal infections affect all ages, sexes, races, and creeds.  There may be many factors that predispose you to a fungal infection such as age, coexisting medical conditions such as diabetes, or an autoimmune disease; stress, medications, fatigue, genetics, etc.  Bottom line: fungus thrives in a warm, moist environment and your shoes offer such a location.  IS IT CONTAGIOUS? Theoretically, yes.  You do not want to share shoes, nail clippers or files with someone who has fungal toenails.  Walking around barefoot in the same room or sleeping in the same bed is unlikely to transfer the organism.  It is important to realize, however, that fungus can spread easily from one nail to the next on the same foot.  HOW DO WE TREAT THIS?  There are several ways to treat this condition.  Treatment may depend on many factors such as age, medications, pregnancy, liver and kidney conditions, etc.  It is best to ask your doctor which options are available to you.  1. No treatment.   Unlike many other medical concerns, you can live with this condition.  However for many people this can be a painful condition and may lead to  ingrown toenails or a bacterial infection.  It is recommended that you keep the nails cut short to help reduce the amount of fungal nail. 2. Topical treatment.  These range from herbal remedies to prescription strength nail lacquers.  About 40-50% effective, topicals require twice daily application for approximately 9 to 12 months or until an entirely new nail has grown out.  The most effective topicals are medical grade medications available through physicians offices. 3. Oral antifungal medications.  With an 80-90% cure rate, the most common oral medication requires 3 to 4 months of therapy and stays in your system for a year as the new nail grows out.  Oral antifungal medications do require   blood work to make sure it is a safe drug for you.  A liver function panel will be performed prior to starting the medication and after the first month of treatment.  It is important to have the blood work performed to avoid any harmful side effects.  In general, this medication safe but blood work is required. 4. Laser Therapy.  This treatment is performed by applying a specialized laser to the affected nail plate.  This therapy is noninvasive, fast, and non-painful.  It is not covered by insurance and is therefore, out of pocket.  The results have been very good with a 80-95% cure rate.  The [facility] is the only practice in the area to offer this therapy. 5. Permanent Nail Avulsion.  Removing the entire nail so that a new nail will not grow back. 

## 2019-04-24 NOTE — Progress Notes (Signed)
Subjective:  Richard Navarro presents to clinic today with cc of  painful, thick, discolored, elongated toenails 1-5 b/l that become tender and cannot cut because of thickness.  Pain is aggravated when wearing enclosed shoe gear.  He voices no new pedal complaints on today's visit.   Current Outpatient Medications:  .  AFINITOR 7.5 MG tablet, Take 1 tablet (7.5 mg total) by mouth daily., Disp: 90 tablet, Rfl: 2 .  albuterol (VENTOLIN HFA) 108 (90 Base) MCG/ACT inhaler, Inhale 2 puffs into the lungs every 4 (four) hours as needed for wheezing or shortness of breath., Disp: 3 Inhaler, Rfl: 2 .  allopurinol (ZYLOPRIM) 100 MG tablet, Take 1 tablet (100 mg total) by mouth daily., Disp: 90 tablet, Rfl: 3 .  amLODipine (NORVASC) 5 MG tablet, Take 1 tablet (5 mg total) by mouth daily., Disp: 90 tablet, Rfl: 3 .  Continuous Blood Gluc Sensor (FREESTYLE LIBRE 14 DAY SENSOR) MISC, APPLY AS DIRECTED., Disp: 1 each, Rfl: 0 .  diclofenac sodium (VOLTAREN) 1 % GEL, APPLY 2 GRAMS TOPICALLY FOUR TIMES A DAY AS NEEDED FOR LEFT SHOULDER ARTHRITIS, Disp: 300 g, Rfl: 3 .  Ferrous Sulfate (IRON) 325 (65 Fe) MG TABS, Take 1 tablet by mouth daily., Disp: , Rfl:  .  glimepiride (AMARYL) 4 MG tablet, TAKE 1 TABLET ONCE DAILY BEFORE BREAKFAST., Disp: 90 tablet, Rfl: 3 .  glucose blood test strip, Use to test blood sugar twice a day, Disp: 200 each, Rfl: 3 .  guaiFENesin (MUCINEX) 600 MG 12 hr tablet, Take by mouth 2 (two) times daily., Disp: , Rfl:  .  hydrochlorothiazide (MICROZIDE) 12.5 MG capsule, Take 1 capsule (12.5 mg total) by mouth daily., Disp: 90 capsule, Rfl: 3 .  loperamide (IMODIUM A-D) 2 MG tablet, Take 1 mg by mouth daily as needed for diarrhea or loose stools., Disp: , Rfl:  .  metFORMIN (GLUCOPHAGE) 500 MG tablet, Take 1 tablet (500 mg total) by mouth 2 (two) times daily with a meal., Disp: 180 tablet, Rfl: 3 .  pantoprazole (PROTONIX) 40 MG tablet, Take 1 tablet (40 mg total) by mouth daily., Disp: 90  tablet, Rfl: 3 .  Tetrahydrozoline HCl (EYE DROPS OP), Place 1 drop into both eyes daily as needed (redness relief)., Disp: , Rfl:  .  Tiotropium Bromide Monohydrate (SPIRIVA RESPIMAT) 2.5 MCG/ACT AERS, Inhale 2 puffs into the lungs daily., Disp: 12 g, Rfl: 1 .  vitamin B-12 (CYANOCOBALAMIN) 1000 MCG tablet, Take 1,000 mcg by mouth daily. Every other day, Disp: , Rfl:  .  vitamin C (ASCORBIC ACID) 500 MG tablet, Take 500 mg by mouth daily., Disp: , Rfl:    Allergies  Allergen Reactions  . Lisinopril Swelling    Angioedema- on this and afinitor same time  . Simvastatin Other (See Comments)    Joint ache     Objective: Physical Examination:  Vascular Examination: Capillary refill time <3 seconds x 10 digits.  DP pulses 1/4 b/l.  PT pulses absent b/l.  Digital hair present b/l.  No edema noted b/l.  Skin temperature gradient WNL b/l.  Dermatological Examination: Pedal skin thin, shiny and atrophic b/l.  No open wounds b/l.  No interdigital macerations noted b/l.  Elongated, thick, discolored brittle toenails with subungual debris and pain on dorsal palpation of nailbeds 1-5 b/l.  Musculoskeletal Examination: Muscle strength 5/5 to all muscle groups b/l  No pain, crepitus or joint discomfort with active/passive ROM.  Neurological Examination: Sensation intact 5/5 b/l with 10 gram monofilament.  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 Toenails 1-5 b/l were debrided in length and girth without iatrogenic laceration. 2.  Continue soft, supportive shoe gear daily. 3.  Report any pedal injuries to medical professional. 4.  Follow up 3 months. 5.  Patient/POA to call should there be a question/concern in there interim.

## 2019-05-02 ENCOUNTER — Other Ambulatory Visit: Payer: Self-pay

## 2019-05-02 ENCOUNTER — Telehealth: Payer: Self-pay | Admitting: Medical Oncology

## 2019-05-02 ENCOUNTER — Other Ambulatory Visit: Payer: Self-pay | Admitting: Internal Medicine

## 2019-05-02 ENCOUNTER — Encounter (HOSPITAL_COMMUNITY): Payer: Self-pay

## 2019-05-02 ENCOUNTER — Inpatient Hospital Stay (HOSPITAL_COMMUNITY)
Admission: EM | Admit: 2019-05-02 | Discharge: 2019-05-06 | DRG: 683 | Disposition: A | Discharge status: Home / Self Care | Payer: Medicare Other | Attending: Internal Medicine | Admitting: Internal Medicine

## 2019-05-02 ENCOUNTER — Telehealth: Payer: Self-pay | Admitting: *Deleted

## 2019-05-02 ENCOUNTER — Ambulatory Visit (HOSPITAL_COMMUNITY)
Admission: RE | Admit: 2019-05-02 | Discharge: 2019-05-02 | Disposition: A | Discharge status: Home / Self Care | Payer: Medicare Other | Source: Ambulatory Visit | Attending: Internal Medicine | Admitting: Internal Medicine

## 2019-05-02 ENCOUNTER — Inpatient Hospital Stay: Payer: Medicare Other | Attending: Internal Medicine

## 2019-05-02 DIAGNOSIS — K219 Gastro-esophageal reflux disease without esophagitis: Secondary | ICD-10-CM | POA: Diagnosis present

## 2019-05-02 DIAGNOSIS — Z85118 Personal history of other malignant neoplasm of bronchus and lung: Secondary | ICD-10-CM

## 2019-05-02 DIAGNOSIS — J439 Emphysema, unspecified: Secondary | ICD-10-CM | POA: Diagnosis not present

## 2019-05-02 DIAGNOSIS — E119 Type 2 diabetes mellitus without complications: Secondary | ICD-10-CM | POA: Insufficient documentation

## 2019-05-02 DIAGNOSIS — Z888 Allergy status to other drugs, medicaments and biological substances status: Secondary | ICD-10-CM

## 2019-05-02 DIAGNOSIS — C3431 Malignant neoplasm of lower lobe, right bronchus or lung: Secondary | ICD-10-CM | POA: Diagnosis not present

## 2019-05-02 DIAGNOSIS — Z801 Family history of malignant neoplasm of trachea, bronchus and lung: Secondary | ICD-10-CM

## 2019-05-02 DIAGNOSIS — F329 Major depressive disorder, single episode, unspecified: Secondary | ICD-10-CM | POA: Diagnosis present

## 2019-05-02 DIAGNOSIS — E1159 Type 2 diabetes mellitus with other circulatory complications: Secondary | ICD-10-CM | POA: Diagnosis present

## 2019-05-02 DIAGNOSIS — E876 Hypokalemia: Secondary | ICD-10-CM | POA: Diagnosis not present

## 2019-05-02 DIAGNOSIS — R7989 Other specified abnormal findings of blood chemistry: Secondary | ICD-10-CM

## 2019-05-02 DIAGNOSIS — R64 Cachexia: Secondary | ICD-10-CM | POA: Diagnosis not present

## 2019-05-02 DIAGNOSIS — Z7984 Long term (current) use of oral hypoglycemic drugs: Secondary | ICD-10-CM | POA: Insufficient documentation

## 2019-05-02 DIAGNOSIS — Z79899 Other long term (current) drug therapy: Secondary | ICD-10-CM

## 2019-05-02 DIAGNOSIS — C787 Secondary malignant neoplasm of liver and intrahepatic bile duct: Secondary | ICD-10-CM | POA: Diagnosis not present

## 2019-05-02 DIAGNOSIS — D649 Anemia, unspecified: Secondary | ICD-10-CM | POA: Insufficient documentation

## 2019-05-02 DIAGNOSIS — Z833 Family history of diabetes mellitus: Secondary | ICD-10-CM

## 2019-05-02 DIAGNOSIS — I152 Hypertension secondary to endocrine disorders: Secondary | ICD-10-CM | POA: Diagnosis present

## 2019-05-02 DIAGNOSIS — C3490 Malignant neoplasm of unspecified part of unspecified bronchus or lung: Secondary | ICD-10-CM

## 2019-05-02 DIAGNOSIS — E1169 Type 2 diabetes mellitus with other specified complication: Secondary | ICD-10-CM | POA: Diagnosis present

## 2019-05-02 DIAGNOSIS — E1122 Type 2 diabetes mellitus with diabetic chronic kidney disease: Secondary | ICD-10-CM | POA: Diagnosis present

## 2019-05-02 DIAGNOSIS — D6481 Anemia due to antineoplastic chemotherapy: Secondary | ICD-10-CM | POA: Diagnosis present

## 2019-05-02 DIAGNOSIS — D631 Anemia in chronic kidney disease: Secondary | ICD-10-CM | POA: Diagnosis present

## 2019-05-02 DIAGNOSIS — Z8249 Family history of ischemic heart disease and other diseases of the circulatory system: Secondary | ICD-10-CM

## 2019-05-02 DIAGNOSIS — N189 Chronic kidney disease, unspecified: Secondary | ICD-10-CM | POA: Insufficient documentation

## 2019-05-02 DIAGNOSIS — Z681 Body mass index (BMI) 19 or less, adult: Secondary | ICD-10-CM

## 2019-05-02 DIAGNOSIS — N183 Chronic kidney disease, stage 3 (moderate): Secondary | ICD-10-CM | POA: Diagnosis present

## 2019-05-02 DIAGNOSIS — E785 Hyperlipidemia, unspecified: Secondary | ICD-10-CM | POA: Diagnosis present

## 2019-05-02 DIAGNOSIS — Z7951 Long term (current) use of inhaled steroids: Secondary | ICD-10-CM

## 2019-05-02 DIAGNOSIS — I129 Hypertensive chronic kidney disease with stage 1 through stage 4 chronic kidney disease, or unspecified chronic kidney disease: Secondary | ICD-10-CM | POA: Diagnosis present

## 2019-05-02 DIAGNOSIS — T451X5A Adverse effect of antineoplastic and immunosuppressive drugs, initial encounter: Secondary | ICD-10-CM | POA: Diagnosis present

## 2019-05-02 DIAGNOSIS — B961 Klebsiella pneumoniae [K. pneumoniae] as the cause of diseases classified elsewhere: Secondary | ICD-10-CM | POA: Diagnosis present

## 2019-05-02 DIAGNOSIS — R590 Localized enlarged lymph nodes: Secondary | ICD-10-CM | POA: Diagnosis not present

## 2019-05-02 DIAGNOSIS — N179 Acute kidney failure, unspecified: Principal | ICD-10-CM | POA: Diagnosis present

## 2019-05-02 DIAGNOSIS — D696 Thrombocytopenia, unspecified: Secondary | ICD-10-CM

## 2019-05-02 DIAGNOSIS — Z20828 Contact with and (suspected) exposure to other viral communicable diseases: Secondary | ICD-10-CM | POA: Diagnosis not present

## 2019-05-02 DIAGNOSIS — Z66 Do not resuscitate: Secondary | ICD-10-CM | POA: Diagnosis not present

## 2019-05-02 DIAGNOSIS — M899 Disorder of bone, unspecified: Secondary | ICD-10-CM | POA: Diagnosis present

## 2019-05-02 DIAGNOSIS — I10 Essential (primary) hypertension: Secondary | ICD-10-CM | POA: Diagnosis present

## 2019-05-02 DIAGNOSIS — I700 Atherosclerosis of aorta: Secondary | ICD-10-CM | POA: Diagnosis not present

## 2019-05-02 DIAGNOSIS — Z03818 Encounter for observation for suspected exposure to other biological agents ruled out: Secondary | ICD-10-CM | POA: Diagnosis not present

## 2019-05-02 DIAGNOSIS — I2510 Atherosclerotic heart disease of native coronary artery without angina pectoris: Secondary | ICD-10-CM | POA: Diagnosis not present

## 2019-05-02 DIAGNOSIS — R918 Other nonspecific abnormal finding of lung field: Secondary | ICD-10-CM | POA: Diagnosis not present

## 2019-05-02 DIAGNOSIS — R509 Fever, unspecified: Secondary | ICD-10-CM

## 2019-05-02 DIAGNOSIS — N1832 Chronic kidney disease, stage 3b: Secondary | ICD-10-CM | POA: Diagnosis present

## 2019-05-02 DIAGNOSIS — Z791 Long term (current) use of non-steroidal anti-inflammatories (NSAID): Secondary | ICD-10-CM | POA: Insufficient documentation

## 2019-05-02 DIAGNOSIS — K449 Diaphragmatic hernia without obstruction or gangrene: Secondary | ICD-10-CM | POA: Diagnosis not present

## 2019-05-02 DIAGNOSIS — C7B8 Other secondary neuroendocrine tumors: Secondary | ICD-10-CM | POA: Insufficient documentation

## 2019-05-02 DIAGNOSIS — C7A8 Other malignant neuroendocrine tumors: Secondary | ICD-10-CM | POA: Insufficient documentation

## 2019-05-02 DIAGNOSIS — E1129 Type 2 diabetes mellitus with other diabetic kidney complication: Secondary | ICD-10-CM | POA: Diagnosis present

## 2019-05-02 DIAGNOSIS — M898X8 Other specified disorders of bone, other site: Secondary | ICD-10-CM | POA: Diagnosis not present

## 2019-05-02 DIAGNOSIS — Z87891 Personal history of nicotine dependence: Secondary | ICD-10-CM

## 2019-05-02 DIAGNOSIS — J449 Chronic obstructive pulmonary disease, unspecified: Secondary | ICD-10-CM | POA: Diagnosis present

## 2019-05-02 DIAGNOSIS — Z800 Family history of malignant neoplasm of digestive organs: Secondary | ICD-10-CM

## 2019-05-02 DIAGNOSIS — R531 Weakness: Secondary | ICD-10-CM | POA: Diagnosis not present

## 2019-05-02 LAB — CBC WITH DIFFERENTIAL/PLATELET
Abs Immature Granulocytes: 0.01 10*3/uL (ref 0.00–0.07)
Basophils Absolute: 0 10*3/uL (ref 0.0–0.1)
Basophils Relative: 0 %
Eosinophils Absolute: 0 10*3/uL (ref 0.0–0.5)
Eosinophils Relative: 0 %
HCT: 29.2 % — ABNORMAL LOW (ref 39.0–52.0)
Hemoglobin: 9.6 g/dL — ABNORMAL LOW (ref 13.0–17.0)
Immature Granulocytes: 0 %
Lymphocytes Relative: 32 %
Lymphs Abs: 1.4 10*3/uL (ref 0.7–4.0)
MCH: 26.2 pg (ref 26.0–34.0)
MCHC: 32.9 g/dL (ref 30.0–36.0)
MCV: 79.8 fL — ABNORMAL LOW (ref 80.0–100.0)
Monocytes Absolute: 0.3 10*3/uL (ref 0.1–1.0)
Monocytes Relative: 6 %
Neutro Abs: 2.7 10*3/uL (ref 1.7–7.7)
Neutrophils Relative %: 62 %
Platelets: 155 10*3/uL (ref 150–400)
RBC: 3.66 MIL/uL — ABNORMAL LOW (ref 4.22–5.81)
RDW: 14.6 % (ref 11.5–15.5)
WBC: 4.5 10*3/uL (ref 4.0–10.5)
nRBC: 0 % (ref 0.0–0.2)

## 2019-05-02 LAB — URINALYSIS, ROUTINE W REFLEX MICROSCOPIC
Bilirubin Urine: NEGATIVE
Glucose, UA: NEGATIVE mg/dL
Hgb urine dipstick: NEGATIVE
Ketones, ur: NEGATIVE mg/dL
Leukocytes,Ua: NEGATIVE
Nitrite: NEGATIVE
Protein, ur: 30 mg/dL — AB
Specific Gravity, Urine: 1.013 (ref 1.005–1.030)
pH: 5 (ref 5.0–8.0)

## 2019-05-02 LAB — CBC WITH DIFFERENTIAL (CANCER CENTER ONLY)
Abs Immature Granulocytes: 0.01 10*3/uL (ref 0.00–0.07)
Basophils Absolute: 0 10*3/uL (ref 0.0–0.1)
Basophils Relative: 0 %
Eosinophils Absolute: 0 10*3/uL (ref 0.0–0.5)
Eosinophils Relative: 0 %
HCT: 32 % — ABNORMAL LOW (ref 39.0–52.0)
Hemoglobin: 10.5 g/dL — ABNORMAL LOW (ref 13.0–17.0)
Immature Granulocytes: 0 %
Lymphocytes Relative: 26 %
Lymphs Abs: 1.6 10*3/uL (ref 0.7–4.0)
MCH: 26.1 pg (ref 26.0–34.0)
MCHC: 32.8 g/dL (ref 30.0–36.0)
MCV: 79.4 fL — ABNORMAL LOW (ref 80.0–100.0)
Monocytes Absolute: 0.3 10*3/uL (ref 0.1–1.0)
Monocytes Relative: 5 %
Neutro Abs: 4.1 10*3/uL (ref 1.7–7.7)
Neutrophils Relative %: 69 %
Platelet Count: 136 10*3/uL — ABNORMAL LOW (ref 150–400)
RBC: 4.03 MIL/uL — ABNORMAL LOW (ref 4.22–5.81)
RDW: 14.5 % (ref 11.5–15.5)
WBC Count: 6.1 10*3/uL (ref 4.0–10.5)
nRBC: 0 % (ref 0.0–0.2)

## 2019-05-02 LAB — COMPREHENSIVE METABOLIC PANEL
ALT: 17 U/L (ref 0–44)
AST: 18 U/L (ref 15–41)
Albumin: 3.9 g/dL (ref 3.5–5.0)
Alkaline Phosphatase: 60 U/L (ref 38–126)
Anion gap: 13 (ref 5–15)
BUN: 53 mg/dL — ABNORMAL HIGH (ref 8–23)
CO2: 22 mmol/L (ref 22–32)
Calcium: 8.8 mg/dL — ABNORMAL LOW (ref 8.9–10.3)
Chloride: 104 mmol/L (ref 98–111)
Creatinine, Ser: 3.53 mg/dL — ABNORMAL HIGH (ref 0.61–1.24)
GFR calc Af Amer: 18 mL/min — ABNORMAL LOW (ref >60)
GFR calc non Af Amer: 16 mL/min — ABNORMAL LOW (ref >60)
Glucose, Bld: 191 mg/dL — ABNORMAL HIGH (ref 70–99)
Potassium: 3.6 mmol/L (ref 3.5–5.1)
Sodium: 139 mmol/L (ref 135–145)
Total Bilirubin: 0.4 mg/dL (ref 0.3–1.2)
Total Protein: 8 g/dL (ref 6.5–8.1)

## 2019-05-02 LAB — CMP (CANCER CENTER ONLY)
ALT: 14 U/L (ref 0–44)
AST: 17 U/L (ref 15–41)
Albumin: 4.1 g/dL (ref 3.5–5.0)
Alkaline Phosphatase: 78 U/L (ref 38–126)
Anion gap: 15 (ref 5–15)
BUN: 50 mg/dL — ABNORMAL HIGH (ref 8–23)
CO2: 24 mmol/L (ref 22–32)
Calcium: 9.5 mg/dL (ref 8.9–10.3)
Chloride: 104 mmol/L (ref 98–111)
Creatinine: 3.43 mg/dL (ref 0.61–1.24)
GFR, Est AFR Am: 19 mL/min — ABNORMAL LOW (ref >60)
GFR, Estimated: 17 mL/min — ABNORMAL LOW (ref >60)
Glucose, Bld: 170 mg/dL — ABNORMAL HIGH (ref 70–99)
Potassium: 3.4 mmol/L — ABNORMAL LOW (ref 3.5–5.1)
Sodium: 143 mmol/L (ref 135–145)
Total Bilirubin: 0.3 mg/dL (ref 0.3–1.2)
Total Protein: 8.5 g/dL — ABNORMAL HIGH (ref 6.5–8.1)

## 2019-05-02 MED ORDER — FREESTYLE LIBRE 14 DAY SENSOR MISC: 1.0000 | 2 refills | Status: DC

## 2019-05-02 MED ORDER — SODIUM CHLORIDE 0.9 % IV BOLUS
1500.0000 mL | Freq: Once | INTRAVENOUS | Status: AC
  Administered 2019-05-02: 1500 mL via INTRAVENOUS

## 2019-05-02 NOTE — Telephone Encounter (Signed)
Per Dr [name] Pt needs to stop Afinitor. I was unable to leave a message on pts phone.

## 2019-05-02 NOTE — ED Triage Notes (Addendum)
Patient reports he has a CT and blood work this morning.   Patient was told to come to ED due to his kidneys not functioning the way they are suppose too.   Creatinine 3.43  A/ox4 Ambulatory in triage.   98.7

## 2019-05-02 NOTE — Telephone Encounter (Signed)
Spoke with [name] and told her Kiree needs to go to ED now. She said he has not been feeling well . She will contact him and get him to the ED.

## 2019-05-02 NOTE — Telephone Encounter (Signed)
Elevated creatinine-Per [name] he said for pt to go to ED after Ct scan . I asked Tedra Coupe to tell pt to go to ED after Ct scan. Pt had already left radiology so he was not notified.  Unable to get pt or [name] on the phone and unable to leave message.

## 2019-05-02 NOTE — Telephone Encounter (Signed)
Per Dr [name] I told Richard Navarro  to send pt to ED after CT scan due elevated Creatinine.
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
## 2019-05-02 NOTE — Telephone Encounter (Signed)
CRITICAL VALUE STICKER  CRITICAL VALUE: Crea = 3.43   RECEIVER (on-site recipient of call): Cherylynn Ridges RN, Triage [place] NOTIFIED: 05/02/2019 at 0956.   MESSENGER (representative from lab): Kushani MT Myles Gip  MD NOTIFIED: Dr. [name]  TIME OF NOTIFICATION: 05/02/2019 at 1011  RESPONSE: None

## 2019-05-03 ENCOUNTER — Inpatient Hospital Stay (HOSPITAL_COMMUNITY): Payer: Medicare Other

## 2019-05-03 DIAGNOSIS — Z66 Do not resuscitate: Secondary | ICD-10-CM | POA: Diagnosis present

## 2019-05-03 DIAGNOSIS — N183 Chronic kidney disease, stage 3 (moderate): Secondary | ICD-10-CM | POA: Diagnosis not present

## 2019-05-03 DIAGNOSIS — E876 Hypokalemia: Secondary | ICD-10-CM | POA: Diagnosis not present

## 2019-05-03 DIAGNOSIS — N179 Acute kidney failure, unspecified: Secondary | ICD-10-CM | POA: Diagnosis present

## 2019-05-03 DIAGNOSIS — J449 Chronic obstructive pulmonary disease, unspecified: Secondary | ICD-10-CM

## 2019-05-03 DIAGNOSIS — Z87891 Personal history of nicotine dependence: Secondary | ICD-10-CM | POA: Diagnosis not present

## 2019-05-03 DIAGNOSIS — Z681 Body mass index (BMI) 19 or less, adult: Secondary | ICD-10-CM | POA: Diagnosis not present

## 2019-05-03 DIAGNOSIS — E785 Hyperlipidemia, unspecified: Secondary | ICD-10-CM | POA: Diagnosis present

## 2019-05-03 DIAGNOSIS — Z888 Allergy status to other drugs, medicaments and biological substances status: Secondary | ICD-10-CM | POA: Diagnosis not present

## 2019-05-03 DIAGNOSIS — B961 Klebsiella pneumoniae [K. pneumoniae] as the cause of diseases classified elsewhere: Secondary | ICD-10-CM | POA: Diagnosis present

## 2019-05-03 DIAGNOSIS — R64 Cachexia: Secondary | ICD-10-CM | POA: Diagnosis present

## 2019-05-03 DIAGNOSIS — D631 Anemia in chronic kidney disease: Secondary | ICD-10-CM | POA: Diagnosis present

## 2019-05-03 DIAGNOSIS — K219 Gastro-esophageal reflux disease without esophagitis: Secondary | ICD-10-CM | POA: Diagnosis present

## 2019-05-03 DIAGNOSIS — I129 Hypertensive chronic kidney disease with stage 1 through stage 4 chronic kidney disease, or unspecified chronic kidney disease: Secondary | ICD-10-CM | POA: Diagnosis present

## 2019-05-03 DIAGNOSIS — E1122 Type 2 diabetes mellitus with diabetic chronic kidney disease: Secondary | ICD-10-CM | POA: Diagnosis not present

## 2019-05-03 DIAGNOSIS — Z85118 Personal history of other malignant neoplasm of bronchus and lung: Secondary | ICD-10-CM | POA: Diagnosis not present

## 2019-05-03 DIAGNOSIS — F329 Major depressive disorder, single episode, unspecified: Secondary | ICD-10-CM | POA: Diagnosis present

## 2019-05-03 DIAGNOSIS — I10 Essential (primary) hypertension: Secondary | ICD-10-CM

## 2019-05-03 DIAGNOSIS — E1159 Type 2 diabetes mellitus with other circulatory complications: Secondary | ICD-10-CM

## 2019-05-03 DIAGNOSIS — R509 Fever, unspecified: Secondary | ICD-10-CM | POA: Diagnosis not present

## 2019-05-03 DIAGNOSIS — C787 Secondary malignant neoplasm of liver and intrahepatic bile duct: Secondary | ICD-10-CM | POA: Diagnosis present

## 2019-05-03 DIAGNOSIS — N281 Cyst of kidney, acquired: Secondary | ICD-10-CM | POA: Diagnosis not present

## 2019-05-03 DIAGNOSIS — M899 Disorder of bone, unspecified: Secondary | ICD-10-CM | POA: Diagnosis present

## 2019-05-03 DIAGNOSIS — E1169 Type 2 diabetes mellitus with other specified complication: Secondary | ICD-10-CM | POA: Diagnosis present

## 2019-05-03 DIAGNOSIS — D6481 Anemia due to antineoplastic chemotherapy: Secondary | ICD-10-CM | POA: Diagnosis present

## 2019-05-03 DIAGNOSIS — R531 Weakness: Secondary | ICD-10-CM | POA: Diagnosis not present

## 2019-05-03 DIAGNOSIS — Z20828 Contact with and (suspected) exposure to other viral communicable diseases: Secondary | ICD-10-CM | POA: Diagnosis present

## 2019-05-03 DIAGNOSIS — D649 Anemia, unspecified: Secondary | ICD-10-CM | POA: Diagnosis not present

## 2019-05-03 DIAGNOSIS — D696 Thrombocytopenia, unspecified: Secondary | ICD-10-CM | POA: Diagnosis not present

## 2019-05-03 DIAGNOSIS — T451X5A Adverse effect of antineoplastic and immunosuppressive drugs, initial encounter: Secondary | ICD-10-CM | POA: Diagnosis present

## 2019-05-03 LAB — COMPREHENSIVE METABOLIC PANEL
ALT: 16 U/L (ref 0–44)
AST: 16 U/L (ref 15–41)
Albumin: 3.6 g/dL (ref 3.5–5.0)
Alkaline Phosphatase: 59 U/L (ref 38–126)
Anion gap: 15 (ref 5–15)
BUN: 47 mg/dL — ABNORMAL HIGH (ref 8–23)
CO2: 19 mmol/L — ABNORMAL LOW (ref 22–32)
Calcium: 8.4 mg/dL — ABNORMAL LOW (ref 8.9–10.3)
Chloride: 108 mmol/L (ref 98–111)
Creatinine, Ser: 3.2 mg/dL — ABNORMAL HIGH (ref 0.61–1.24)
GFR calc Af Amer: 21 mL/min — ABNORMAL LOW (ref >60)
GFR calc non Af Amer: 18 mL/min — ABNORMAL LOW (ref >60)
Glucose, Bld: 157 mg/dL — ABNORMAL HIGH (ref 70–99)
Potassium: 3.2 mmol/L — ABNORMAL LOW (ref 3.5–5.1)
Sodium: 142 mmol/L (ref 135–145)
Total Bilirubin: 0.6 mg/dL (ref 0.3–1.2)
Total Protein: 7.2 g/dL (ref 6.5–8.1)

## 2019-05-03 LAB — CBC
HCT: 29.1 % — ABNORMAL LOW (ref 39.0–52.0)
Hemoglobin: 9.3 g/dL — ABNORMAL LOW (ref 13.0–17.0)
MCH: 25.9 pg — ABNORMAL LOW (ref 26.0–34.0)
MCHC: 32 g/dL (ref 30.0–36.0)
MCV: 81.1 fL (ref 80.0–100.0)
Platelets: 130 10*3/uL — ABNORMAL LOW (ref 150–400)
RBC: 3.59 MIL/uL — ABNORMAL LOW (ref 4.22–5.81)
RDW: 14.7 % (ref 11.5–15.5)
WBC: 6.3 10*3/uL (ref 4.0–10.5)
nRBC: 0 % (ref 0.0–0.2)

## 2019-05-03 LAB — GLUCOSE, CAPILLARY
Glucose-Capillary: 137 mg/dL — ABNORMAL HIGH (ref 70–99)
Glucose-Capillary: 220 mg/dL — ABNORMAL HIGH (ref 70–99)
Glucose-Capillary: 187 mg/dL — ABNORMAL HIGH (ref 70–99)
Glucose-Capillary: 170 mg/dL — ABNORMAL HIGH (ref 70–99)

## 2019-05-03 LAB — SARS CORONAVIRUS 2 (TAT 6-24 HRS): SARS Coronavirus 2: NEGATIVE

## 2019-05-03 MED ORDER — SORBITOL 70 % SOLN: 30.0000 mL | Status: DC | PRN

## 2019-05-03 MED ORDER — ALLOPURINOL 100 MG PO TABS
100.0000 mg | ORAL_TABLET | Freq: Every day | ORAL | Status: DC
  Administered 2019-05-03 – 2019-05-06 (×4): 100 mg via ORAL
  Filled 2019-05-03 (×4): qty 1

## 2019-05-03 MED ORDER — ONDANSETRON HCL 4 MG/2ML IJ SOLN: 4.0000 mg | Freq: Four times a day (QID) | INTRAMUSCULAR | Status: DC | PRN

## 2019-05-03 MED ORDER — ACETAMINOPHEN 325 MG PO TABS
650.0000 mg | ORAL_TABLET | Freq: Four times a day (QID) | ORAL | Status: DC | PRN
  Administered 2019-05-04 – 2019-05-05 (×3): 650 mg via ORAL
  Filled 2019-05-03 (×3): qty 2

## 2019-05-03 MED ORDER — ACETAMINOPHEN 650 MG RE SUPP: 650.0000 mg | Freq: Four times a day (QID) | RECTAL | Status: DC | PRN

## 2019-05-03 MED ORDER — PANTOPRAZOLE SODIUM 40 MG PO TBEC
40.0000 mg | DELAYED_RELEASE_TABLET | Freq: Every day | ORAL | Status: DC
  Administered 2019-05-03 – 2019-05-06 (×4): 40 mg via ORAL
  Filled 2019-05-03 (×4): qty 1

## 2019-05-03 MED ORDER — NEPRO/CARBSTEADY PO LIQD
237.0000 mL | Freq: Three times a day (TID) | ORAL | Status: DC | PRN
  Filled 2019-05-03: qty 237

## 2019-05-03 MED ORDER — HEPARIN SODIUM (PORCINE) 5000 UNIT/ML IJ SOLN
5000.0000 [IU] | Freq: Three times a day (TID) | INTRAMUSCULAR | Status: DC
  Administered 2019-05-03 – 2019-05-06 (×10): 5000 [IU] via SUBCUTANEOUS
  Filled 2019-05-03 (×9): qty 1

## 2019-05-03 MED ORDER — AMLODIPINE BESYLATE 5 MG PO TABS
5.0000 mg | ORAL_TABLET | Freq: Every day | ORAL | Status: DC
  Administered 2019-05-03 – 2019-05-06 (×4): 5 mg via ORAL
  Filled 2019-05-03 (×4): qty 1

## 2019-05-03 MED ORDER — ZOLPIDEM TARTRATE 5 MG PO TABS: 5.0000 mg | ORAL_TABLET | Freq: Every evening | ORAL | Status: DC | PRN

## 2019-05-03 MED ORDER — INSULIN ASPART 100 UNIT/ML ~~LOC~~ SOLN
0.0000 [IU] | Freq: Three times a day (TID) | SUBCUTANEOUS | Status: DC
  Administered 2019-05-03: 3 [IU] via SUBCUTANEOUS
  Administered 2019-05-03: 17:00:00 2 [IU] via SUBCUTANEOUS
  Administered 2019-05-03 – 2019-05-04 (×2): 1 [IU] via SUBCUTANEOUS
  Administered 2019-05-04: 3 [IU] via SUBCUTANEOUS
  Administered 2019-05-04 – 2019-05-05 (×3): 2 [IU] via SUBCUTANEOUS
  Administered 2019-05-05: 1 [IU] via SUBCUTANEOUS
  Administered 2019-05-06: 2 [IU] via SUBCUTANEOUS

## 2019-05-03 MED ORDER — CALCIUM CARBONATE ANTACID 1250 MG/5ML PO SUSP
500.0000 mg | Freq: Four times a day (QID) | ORAL | Status: DC | PRN
  Filled 2019-05-03: qty 5

## 2019-05-03 MED ORDER — VITAMIN B-12 1000 MCG PO TABS
1000.0000 ug | ORAL_TABLET | Freq: Every day | ORAL | Status: DC
  Administered 2019-05-03 – 2019-05-06 (×4): 1000 ug via ORAL
  Filled 2019-05-03 (×4): qty 1

## 2019-05-03 MED ORDER — CAMPHOR-MENTHOL 0.5-0.5 % EX LOTN
1.0000 "application " | TOPICAL_LOTION | Freq: Three times a day (TID) | CUTANEOUS | Status: DC | PRN
  Filled 2019-05-03: qty 222

## 2019-05-03 MED ORDER — DOCUSATE SODIUM 283 MG RE ENEM
1.0000 | ENEMA | RECTAL | Status: DC | PRN
  Filled 2019-05-03: qty 1

## 2019-05-03 MED ORDER — ALBUTEROL SULFATE HFA 108 (90 BASE) MCG/ACT IN AERS: 2.0000 | INHALATION_SPRAY | RESPIRATORY_TRACT | Status: DC | PRN

## 2019-05-03 MED ORDER — SODIUM CHLORIDE 0.9 % IV SOLN
INTRAVENOUS | Status: DC
  Administered 2019-05-03 – 2019-05-06 (×7): via INTRAVENOUS

## 2019-05-03 MED ORDER — FERROUS SULFATE 325 (65 FE) MG PO TABS
325.0000 mg | ORAL_TABLET | ORAL | Status: DC
  Administered 2019-05-03 – 2019-05-05 (×2): 325 mg via ORAL
  Filled 2019-05-03 (×2): qty 1

## 2019-05-03 MED ORDER — HYDROXYZINE HCL 25 MG PO TABS: 25.0000 mg | ORAL_TABLET | Freq: Three times a day (TID) | ORAL | Status: DC | PRN

## 2019-05-03 MED ORDER — ONDANSETRON HCL 4 MG PO TABS: 4.0000 mg | ORAL_TABLET | Freq: Four times a day (QID) | ORAL | Status: DC | PRN

## 2019-05-03 MED ORDER — GUAIFENESIN ER 600 MG PO TB12
600.0000 mg | ORAL_TABLET | Freq: Two times a day (BID) | ORAL | Status: DC
  Administered 2019-05-03 – 2019-05-06 (×8): 600 mg via ORAL
  Filled 2019-05-03 (×8): qty 1

## 2019-05-03 MED ORDER — INSULIN ASPART 100 UNIT/ML ~~LOC~~ SOLN: 0.0000 [IU] | Freq: Every day | SUBCUTANEOUS | Status: DC

## 2019-05-03 MED ORDER — DICLOFENAC SODIUM 1 % TD GEL
2.0000 g | Freq: Four times a day (QID) | TRANSDERMAL | Status: DC | PRN
  Filled 2019-05-03: qty 100

## 2019-05-03 MED ORDER — GLIMEPIRIDE 4 MG PO TABS
4.0000 mg | ORAL_TABLET | Freq: Every day | ORAL | Status: DC
  Administered 2019-05-03: 4 mg via ORAL
  Filled 2019-05-03: qty 1

## 2019-05-03 MED ORDER — EVEROLIMUS 7.5 MG PO TABS: 7.5000 mg | ORAL_TABLET | Freq: Every day | ORAL | Status: DC

## 2019-05-03 MED ORDER — VITAMIN C 500 MG PO TABS
500.0000 mg | ORAL_TABLET | Freq: Every day | ORAL | Status: DC
  Administered 2019-05-03 – 2019-05-06 (×4): 500 mg via ORAL
  Filled 2019-05-03 (×4): qty 1

## 2019-05-03 MED ORDER — UMECLIDINIUM BROMIDE 62.5 MCG/INH IN AEPB
1.0000 | INHALATION_SPRAY | Freq: Every day | RESPIRATORY_TRACT | Status: DC
  Administered 2019-05-04 – 2019-05-06 (×3): 1 via RESPIRATORY_TRACT
  Filled 2019-05-03: qty 7

## 2019-05-03 NOTE — Plan of Care (Signed)
73 yo male with ckd 3 gerd dm htn metastatic neuroendocrine carcinoma admitted with worsening renal function which is improving with IV fluids.  Unsure what is the cause of AKI.  He has no history of nausea vomiting or diarrhea.  His renal functions today creatinine 3.20 BUN 47 down from yesterday 53 and creatinine 3.53.  I will obtain a renal ultrasound.  Hold metformin Amaryl and hydrochlorothiazide.?  If AKI related to Afinitor.  Follow-up renal ultrasound continue IV fluids and recheck BMP tomorrow.
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
## 2019-05-04 LAB — BASIC METABOLIC PANEL
Anion gap: 10 (ref 5–15)
BUN: 37 mg/dL — ABNORMAL HIGH (ref 8–23)
CO2: 22 mmol/L (ref 22–32)
Calcium: 8.3 mg/dL — ABNORMAL LOW (ref 8.9–10.3)
Chloride: 110 mmol/L (ref 98–111)
Creatinine, Ser: 3.15 mg/dL — ABNORMAL HIGH (ref 0.61–1.24)
GFR calc Af Amer: 21 mL/min — ABNORMAL LOW (ref >60)
GFR calc non Af Amer: 18 mL/min — ABNORMAL LOW (ref >60)
Glucose, Bld: 157 mg/dL — ABNORMAL HIGH (ref 70–99)
Potassium: 3 mmol/L — ABNORMAL LOW (ref 3.5–5.1)
Sodium: 142 mmol/L (ref 135–145)

## 2019-05-04 LAB — GLUCOSE, CAPILLARY
Glucose-Capillary: 152 mg/dL — ABNORMAL HIGH (ref 70–99)
Glucose-Capillary: 207 mg/dL — ABNORMAL HIGH (ref 70–99)
Glucose-Capillary: 141 mg/dL — ABNORMAL HIGH (ref 70–99)
Glucose-Capillary: 172 mg/dL — ABNORMAL HIGH (ref 70–99)

## 2019-05-04 MED ORDER — LIP MEDEX EX OINT
TOPICAL_OINTMENT | CUTANEOUS | Status: AC
  Administered 2019-05-04: 17:00:00
  Filled 2019-05-04: qty 7

## 2019-05-04 MED ORDER — METOPROLOL TARTRATE 25 MG PO TABS
12.5000 mg | ORAL_TABLET | Freq: Two times a day (BID) | ORAL | Status: DC
  Administered 2019-05-04 – 2019-05-06 (×5): 12.5 mg via ORAL
  Filled 2019-05-04 (×5): qty 1

## 2019-05-04 MED ORDER — GLIPIZIDE 5 MG PO TABS
2.5000 mg | ORAL_TABLET | Freq: Every day | ORAL | Status: DC
  Administered 2019-05-05 – 2019-05-06 (×2): 2.5 mg via ORAL
  Filled 2019-05-04 (×2): qty 1

## 2019-05-04 NOTE — Progress Notes (Addendum)
PROGRESS NOTE    Richard Navarro  YWV:[id number] DOB: 03/30/1946 DOA: 05/02/2019 PCP: [name], MD   Brief Narrative: 73 y.o. male with medical history significant of chronic kidney disease stage III, GERD, diabetes, hypertension, hyperlipidemia, metastatic low-grade neuroendocrine carcinoma who was seen by his PCP and noted to have worsening renal function.  His creatinine has doubled in the last week or so.  Patient was sent over where he was found to have a creatinine of 3.5.  He has been weak.  Patient has apparently been drinking a lot of water but no improvement.  He denied any recent nausea vomiting or diarrhea no fever or chills no sick contacts.  Patient also has had normal control of his blood sugar with no episodes of hypoglycemia.  He is being admitted with acute on chronic kidney failure for evaluation and treatment..  ED Course: Temperature is 98.7 blood pressure 162/86 pulse 104 respiratory rate of 18 oxygen sat 99% on room air.  White count 4.5 hemoglobin 9.6 and platelet count of 155.  Sodium 139 potassium 3.6 chloride 104 CO2 22 BUN 53 and creatinine 3.53.  Calcium 8.8 and glucose 191.  Urinalysis is negative.  COVID-19 screen negative.  CT chest without contrast showed irregular right lower lobe lung mass consistent with previous [name] known carcinoma.  Literally unchanged from previous.  Patient is being admitted for management of acute on chronic renal failure.   Assessment & Plan:   Principal Problem:   ARF (acute renal failure) (HCC) Active Problems:   Diabetes mellitus with renal manifestation (HCC)   Hypertension associated with diabetes ([place])   GERD (gastroesophageal reflux disease)   Hyperlipidemia   COPD (chronic obstructive pulmonary disease) (HCC)   CKD (chronic kidney disease), stage III ([place])   #1 AKI on CKD stage III-patient is being treated with IV hydration.  However his renal functions are improving but very slowly improving.  Holding metformin  Amaryl hydrochlorothiazide and Afinitor.  I will continue fluids at a lower rate.  Renal ultrasound does not show any evidence of hydronephrosis.  Obtain bladder scan.  Strict I's and O's.  #2 stage IV lung cancer his oncologist is Dr. [name] who sent the patient to the ER with increased creatinine.  Patient had a CT of the chest 05/02/2019 without contrast.Irregular right lower lobe lung mass is mildly increased in size since 01/17/2019 chest CT.  Mild subcarinal lymphadenopathy is stable. No new or progressive thoracic adenopathy.  Sclerotic T7 vertebral osseous lesion is stable. No new osseous lesions in the chest.  Small low-attenuation segment 7 right liver lesion is not appreciably changed.  #3 type 2 diabetes Amaryl and metformin on hold we will put him on low dose glipizide with CKD.  #4 hypertension continue Norvasc.  Start Lopressor 12.5 twice daily.  Patient is also tachycardic unclear etiology he is not in any distress or pain or discomfort.  Blood pressure not controlled.  #5 COPD stable continue Mucinex and inhaler. #6 GERD stable on Protonix  Estimated body mass index is 20.27 kg/m as calculated from the following:   Height as of 04/08/19: 6\' 3"  (1.905 m).   Weight as of this encounter: 73.6 kg.  DVT prophylaxis: Heparin  code Status: DO NOT RESUSCITATE Family Communication: None Disposition Plan: Pending clinical improvement  Consultants:   Oncology  Procedures: None Antimicrobials: None  Subjective: Not able to sleep well denies any nausea vomiting diarrhea abdominal pain chest pain or shortness of breath  Objective: Vitals:  05/03/19 2011 05/04/19 0623 05/04/19 0628 05/04/19 0851  BP: 139/65  (!) 168/79 (!) 151/76  Pulse: 93  (!) 116 (!) 110  Resp: 16  18   Temp: 99.2 F (37.3 C)  99.9 F (37.7 C) (!) 100.8 F (38.2 C)  TempSrc: Oral  Oral Oral  SpO2: 96%  100% 100%  Weight:  73.6 kg      Intake/Output Summary (Last 24 hours) at 05/04/2019  0908 Last data filed at 05/03/2019 1700 Gross per 24 hour  Intake 1698.52 ml  Output --  Net 1698.52 ml   Filed Weights   05/04/19 0623  Weight: 73.6 kg    Examination:  General exam: Appears calm and comfortable  Respiratory system: Clear to auscultation. Respiratory effort normal. Cardiovascular system: S1 & S2 heard, RRR. No JVD, murmurs, rubs, gallops or clicks. No pedal edema. Gastrointestinal system: Abdomen is nondistended, soft and nontender. No organomegaly or masses felt. Normal bowel sounds heard. Central nervous system: Alert and oriented. No focal neurological deficits. Extremities: No edema. Skin: No rashes, lesions or ulcers Psychiatry: Judgement and insight appear normal. Mood & affect appropriate.     Data Reviewed: I have personally reviewed following labs and imaging studies  CBC: Recent Labs  Lab 05/02/19 0827 05/02/19 1745 05/03/19 0526  WBC 6.1 4.5 6.3  NEUTROABS 4.1 2.7  --   HGB 10.5* 9.6* 9.3*  HCT 32.0* 29.2* 29.1*  MCV 79.4* 79.8* 81.1  PLT 136* 155 161*   Basic Metabolic Panel: Recent Labs  Lab 05/02/19 0827 05/02/19 1745 05/03/19 0526 05/04/19 0555  NA 143 139 142 142  K 3.4* 3.6 3.2* 3.0*  CL 104 104 108 110  CO2 24 22 19* 22  GLUCOSE 170* 191* 157* 157*  BUN 50* 53* 47* 37*  CREATININE 3.43* 3.53* 3.20* 3.15*  CALCIUM 9.5 8.8* 8.4* 8.3*   GFR: Estimated Creatinine Clearance: 21.1 mL/min (A) (by C-G formula based on SCr of 3.15 mg/dL (H)). Liver Function Tests: Recent Labs  Lab 05/02/19 0827 05/02/19 1745 05/03/19 0526  AST 17 18 16   ALT 14 17 16   ALKPHOS 78 60 59  BILITOT 0.3 0.4 0.6  PROT 8.5* 8.0 7.2  ALBUMIN 4.1 3.9 3.6   No results for input(s): LIPASE, AMYLASE in the last 168 hours. No results for input(s): AMMONIA in the last 168 hours. Coagulation Profile: No results for input(s): INR, PROTIME in the last 168 hours. Cardiac Enzymes: No results for input(s): CKTOTAL, CKMB, CKMBINDEX, TROPONINI in the last  168 hours. BNP (last 3 results) No results for input(s): PROBNP in the last 8760 hours. HbA1C: No results for input(s): HGBA1C in the last 72 hours. CBG: Recent Labs  Lab 05/03/19 0755 05/03/19 1201 05/03/19 1632 05/03/19 2036 05/04/19 0733  GLUCAP 137* 220* 187* 170* 152*   Lipid Profile: No results for input(s): CHOL, HDL, LDLCALC, TRIG, CHOLHDL, LDLDIRECT in the last 72 hours. Thyroid Function Tests: No results for input(s): TSH, T4TOTAL, FREET4, T3FREE, THYROIDAB in the last 72 hours. Anemia Panel: No results for input(s): VITAMINB12, FOLATE, FERRITIN, TIBC, IRON, RETICCTPCT in the last 72 hours. Sepsis Labs: No results for input(s): PROCALCITON, LATICACIDVEN in the last 168 hours.  Recent Results (from the past 240 hour(s))  SARS CORONAVIRUS 2 (TAT 6-24 HRS) Nasopharyngeal Nasopharyngeal Swab     Status: None   Collection Time: 05/03/19  1:49 AM   Specimen: Nasopharyngeal Swab  Result Value Ref Range Status   SARS Coronavirus 2 NEGATIVE NEGATIVE Final    Comment: (NOTE)  SARS-CoV-2 target nucleic acids are NOT DETECTED. The SARS-CoV-2 RNA is generally detectable in upper and lower respiratory specimens during the acute phase of infection. Negative results do not preclude SARS-CoV-2 infection, do not rule out co-infections with other pathogens, and should not be used as the sole basis for treatment or other patient management decisions. Negative results must be combined with clinical observations, patient history, and epidemiological information. The expected result is Negative. Fact Sheet for Patients: SugarRoll.be Fact Sheet for Healthcare Providers: https://www.woods-.com/ This test is not yet approved or cleared by the [place] FDA and  has been authorized for detection and/or diagnosis of SARS-CoV-2 by FDA under an Emergency Use Authorization (EUA). This EUA will remain  in effect (meaning this test can be  used) for the duration of the COVID-19 declaration under Section 56 4(b)(1) of the Act, 21 U.S.C. section 360bbb-3(b)(1), unless the authorization is terminated or revoked sooner. 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 Ongoing oral chemotherapy. Restaging. EXAM: CT CHEST WITHOUT CONTRAST TECHNIQUE: Multidetector CT imaging of the chest was performed following the standard protocol without IV contrast. COMPARISON:  01/17/2019 CT chest, abdomen and pelvis. FINDINGS: Cardiovascular: Normal heart size. No significant pericardial effusion/thickening. Three-vessel coronary atherosclerosis. Mildly atherosclerotic nonaneurysmal thoracic aorta. Normal caliber pulmonary arteries. Mediastinum/Nodes: No discrete thyroid nodules. Unremarkable esophagus. No axillary adenopathy. Mildly enlarged 1.1 cm subcarinal node (series 2/image 81), stable. No new pathologically enlarged mediastinal nodes. No discrete hilar adenopathy on this noncontrast scan. Lungs/Pleura: No pneumothorax. No pleural effusion. Moderate to severe centrilobular and paraseptal emphysema. Irregular right lower lobe lung mass measuring 5.9 x 4.4 cm peripherally (series 5/image 112), previously 5.0 x 4.3 cm on 01/17/2019, increased. Central component of the right lower lobe irregular mass measures 3.6 x 2.4 cm (series 6/image 102), previously 3.4 x 2.4 cm, slightly increased. No acute consolidative airspace disease or new significant pulmonary nodules. Upper abdomen: Small hiatal hernia. Hypodense 1.2 cm segment 7 right liver lesion (series 2/image 119), not appreciably changed. Musculoskeletal: Stable 1.5 cm inferior T7 vertebral sclerotic lesion (series 7/image 93). No new focal osseous lesions.  IMPRESSION: 1. Irregular right lower lobe lung mass is mildly increased in size since 01/17/2019 chest CT. 2. Mild subcarinal lymphadenopathy is stable. No new or progressive thoracic adenopathy. 3. Sclerotic T7 vertebral osseous lesion is stable. No new osseous lesions in the chest. 4. Small low-attenuation segment 7 right liver lesion is not appreciably changed. Aortic Atherosclerosis (ICD10-I70.0) and Emphysema (ICD10-J43.9). Electronically Signed   By: Ilona Sorrel M.D.   On: 05/02/2019 10:47   US Renal  Result Date: 05/03/2019 CLINICAL DATA:  Elevated creatinine. EXAM: RENAL / URINARY TRACT ULTRASOUND COMPLETE COMPARISON:  CT 01/17/2019 FINDINGS: Right Kidney: Renal measurements: 10.1 x 5.3 x 6.3 cm = volume: 177 mL . Echogenicity within normal limits. No mass or hydronephrosis visualized. 3.4 cm cyst over the lower pole. Left Kidney: Renal measurements: 10.2 x 5.4 x 4.8 cm = volume: 140 mL. Echogenicity within normal limits. No mass or hydronephrosis visualized. 1.6 cm cyst over the lower pole. Bladder: Appears normal for degree of bladder distention. IMPRESSION: Normal size kidneys without hydronephrosis.  Bilateral renal cysts. Electronically Signed   By: [name] M.D.   On: 05/03/2019 17:26  Scheduled Meds:  allopurinol  100 mg Oral Daily   amLODipine  5 mg Oral Daily   ferrous sulfate  325 mg Oral QODAY   guaiFENesin  600 mg Oral BID   heparin  5,000 Units Subcutaneous Q8H   insulin aspart  0-5 Units Subcutaneous QHS   insulin aspart  0-9 Units Subcutaneous TID WC   pantoprazole  40 mg Oral Daily   umeclidinium bromide  1 puff Inhalation Daily   vitamin B-12  1,000 mcg Oral Daily   vitamin C  500 mg Oral Daily   Continuous Infusions:  sodium chloride 125 mL/hr at 05/04/19 0646     LOS: 1 day     Georgette Shell, MD [facility]   If 7PM-7AM, please contact night-coverage [email or web address] Password [email or web address] 05/04/2019, 9:08 AM

## 2019-05-04 NOTE — Progress Notes (Signed)
   Vital Signs MEWS/VS Documentation      05/03/2019 0602 05/03/2019 1503 05/03/2019 2011 05/04/2019 0628   MEWS Score:  1  0  0  2   MEWS Score Color:  Green  Green  Green  Yellow   Resp:  16  16  16  18    Pulse:  (!) 105  98  93  (!) 116   BP:  (!) 156/90  (!) 146/72  139/65  (!) 168/79   Temp:  99.2 F (37.3 C)  98.2 F (36.8 C)  99.2 F (37.3 C)  99.9 F (37.7 C)   O2 Device:  Room [employer]  Room [employer]  Room Richard Navarro 05/04/2019,7:55 AM

## 2019-05-05 ENCOUNTER — Inpatient Hospital Stay: Payer: Medicare Other | Admitting: Internal Medicine

## 2019-05-05 ENCOUNTER — Inpatient Hospital Stay (HOSPITAL_COMMUNITY): Payer: Medicare Other

## 2019-05-05 DIAGNOSIS — D649 Anemia, unspecified: Secondary | ICD-10-CM

## 2019-05-05 DIAGNOSIS — D696 Thrombocytopenia, unspecified: Secondary | ICD-10-CM

## 2019-05-05 LAB — BASIC METABOLIC PANEL
Anion gap: 10 (ref 5–15)
BUN: 29 mg/dL — ABNORMAL HIGH (ref 8–23)
CO2: 20 mmol/L — ABNORMAL LOW (ref 22–32)
Calcium: 8.2 mg/dL — ABNORMAL LOW (ref 8.9–10.3)
Chloride: 110 mmol/L (ref 98–111)
Creatinine, Ser: 3.11 mg/dL — ABNORMAL HIGH (ref 0.61–1.24)
GFR calc Af Amer: 22 mL/min — ABNORMAL LOW (ref >60)
GFR calc non Af Amer: 19 mL/min — ABNORMAL LOW (ref >60)
Glucose, Bld: 159 mg/dL — ABNORMAL HIGH (ref 70–99)
Potassium: 2.6 mmol/L — CL (ref 3.5–5.1)
Sodium: 140 mmol/L (ref 135–145)

## 2019-05-05 LAB — MAGNESIUM: Magnesium: 1.6 mg/dL — ABNORMAL LOW (ref 1.7–2.4)

## 2019-05-05 LAB — GLUCOSE, CAPILLARY
Glucose-Capillary: 141 mg/dL — ABNORMAL HIGH (ref 70–99)
Glucose-Capillary: 183 mg/dL — ABNORMAL HIGH (ref 70–99)
Glucose-Capillary: 197 mg/dL — ABNORMAL HIGH (ref 70–99)
Glucose-Capillary: 148 mg/dL — ABNORMAL HIGH (ref 70–99)

## 2019-05-05 MED ORDER — POTASSIUM CHLORIDE CRYS ER 20 MEQ PO TBCR
40.0000 meq | EXTENDED_RELEASE_TABLET | Freq: Once | ORAL | Status: AC
  Administered 2019-05-05: 40 meq via ORAL
  Filled 2019-05-05: qty 2

## 2019-05-05 MED ORDER — SODIUM CHLORIDE 0.9 % IV BOLUS
500.0000 mL | Freq: Once | INTRAVENOUS | Status: AC
  Administered 2019-05-05: 500 mL via INTRAVENOUS

## 2019-05-05 MED ORDER — METOPROLOL TARTRATE 5 MG/5ML IV SOLN
2.5000 mg | Freq: Once | INTRAVENOUS | Status: AC
  Administered 2019-05-05: 2.5 mg via INTRAVENOUS
  Filled 2019-05-05: qty 5

## 2019-05-05 MED ORDER — HYDRALAZINE HCL 20 MG/ML IJ SOLN
10.0000 mg | Freq: Four times a day (QID) | INTRAMUSCULAR | Status: DC | PRN
  Administered 2019-05-05: 10 mg via INTRAVENOUS
  Filled 2019-05-05: qty 1

## 2019-05-05 MED ORDER — POTASSIUM CHLORIDE CRYS ER 20 MEQ PO TBCR
40.0000 meq | EXTENDED_RELEASE_TABLET | Freq: Once | ORAL | Status: AC
  Administered 2019-05-05: 13:00:00 40 meq via ORAL
  Filled 2019-05-05: qty 2

## 2019-05-05 MED ORDER — ACETAMINOPHEN 325 MG PO TABS
325.0000 mg | ORAL_TABLET | Freq: Once | ORAL | Status: AC
  Administered 2019-05-05: 325 mg via ORAL
  Filled 2019-05-05: qty 1

## 2019-05-05 NOTE — Progress Notes (Addendum)
PROGRESS NOTE    Richard Navarro  VPX:[id number] DOB: Oct 12, 1945 DOA: 05/02/2019 PCP: [name], MD    Brief Narrative: 73 y.o.malewith medical history significant ofchronic kidney disease stage III, GERD, diabetes, hypertension, hyperlipidemia, metastatic low-grade neuroendocrine carcinoma who was seen by his PCP and noted to have worsening renal function. His creatinine has doubled in the last week or so. Patient was sent over where he was found to have a creatinine of 3.5. He has been weak. Patient has apparently been drinking a lot of water but no improvement. He denied any recent nausea vomiting or diarrhea no fever or chills no sick contacts. Patient also has had normal control of his blood sugar with no episodes of hypoglycemia. He is being admitted with acute on chronic kidney failure for evaluation and treatment..  ED Course:Temperature is 98.7 blood pressure 162/86 pulse 104 respiratory rate of 18 oxygen sat 99% on room air. White count 4.5 hemoglobin 9.6 and platelet count of 155. Sodium 139 potassium 3.6 chloride 104 CO2 22 BUN 53 and creatinine 3.53. Calcium 8.8 and glucose 191. Urinalysis is negative. COVID-19 screen negative. CT chest without contrast showed irregular right lower lobe lung mass consistent with previous [name] known carcinoma. Literally unchanged from previous. Patient is being admitted for management of acute on chronic renal failure.   Assessment & Plan:   Principal Problem:   ARF (acute renal failure) (HCC) Active Problems:   Diabetes mellitus with renal manifestation (HCC)   Hypertension associated with diabetes ([place])   GERD (gastroesophageal reflux disease)   Hyperlipidemia   COPD (chronic obstructive pulmonary disease) (HCC)   CKD (chronic kidney disease), stage III ([place])    #1 AKI on CKD stage III-patient is being treated with IV hydration.  However his renal functions are improving but very slowly improving.  Holding  metformin Amaryl hydrochlorothiazide and Afinitor.  I will continue fluids at a lower rate.  Renal ultrasound does not show any evidence of hydronephrosis.   #2 stage IV lung cancer his oncologist is Dr. [name] who sent the patient to the ER with increased creatinine.  Patient had a CT of the chest 05/02/2019 without contrast.Irregular right lower lobe lung mass is mildly increased in size since 01/17/2019 chest CT.  Mild subcarinal lymphadenopathy is stable. No new or progressive thoracic adenopathy.  Sclerotic T7 vertebral osseous lesion is stable. No new osseous lesions in the chest.  Small low-attenuation segment 7 right liver lesion is not appreciably changed.  #3 type 2 diabetes blood sugar stable after starting glipizide   #4 hypertension continue Norvasc.  Start Lopressor 12.5 twice daily.  Patient is also tachycardic unclear etiology he is not in any distress or pain or discomfort.  Blood pressure not controlled.  #5 COPD stable continue Mucinex and inhaler.  #6 GERD stable on Protonix  #7 severe hypokalemia replace and recheck check magnesium #7 fever of unclear etiology so far work-up does not show any evidence of infection patient is asymptomatic chest x-ray negative blood culture urine culture drawn so far no growth temperature 100.2 this morning T-max was 102.7.  Follow-up cultures. DVT prophylaxis: Heparin  code Status: DO NOT RESUSCITATE Family Communication: None Disposition Plan: Pending clinical improvement  Consultants:   Oncology  Procedures: None Antimicrobials: None   Estimated body mass index is 19.72 kg/m as calculated from the following:   Height as of 04/08/19: 6\' 3"  (1.905 m).   Weight as of this encounter: 71.6 kg.    Subjective:  Resting in bed  denies any nausea vomiting diarrhea cough abdominal pain chest pain shortness of breath concerned that has had pneumonia last year concerned if he does have another pneumonia now  Objective: T-max  102.7 Vitals:   05/05/19 0606 05/05/19 0657 05/05/19 0850 05/05/19 0920  BP: (!) 160/72 (!) 165/70 (!) 162/86 (!) 152/66  Pulse: (!) 113 (!) 110 (!) 111 (!) 108  Resp: 20 20 17 16   Temp: (!) 100.6 F (38.1 C) 100 F (37.8 C) 99.8 F (37.7 C) 100.2 F (37.9 C)  TempSrc: Oral Oral Oral Oral  SpO2: 98% 97% 98% 97%  Weight:        Intake/Output Summary (Last 24 hours) at 05/05/2019 0937 Last data filed at 05/05/2019 0857 Gross per 24 hour  Intake 4841.86 ml  Output 1025 ml  Net 3816.86 ml   Filed Weights   05/04/19 0623 05/05/19 0509  Weight: 73.6 kg 71.6 kg    Examination:  General exam: Appears calm and comfortable  Respiratory system: Clear to auscultation. Respiratory effort normal. Cardiovascular system: S1 & S2 heard, RRR. No JVD, murmurs, rubs, gallops or clicks. No pedal edema. Gastrointestinal system: Abdomen is nondistended, soft and nontender. No organomegaly or masses felt. Normal bowel sounds heard. Central nervous system: Alert and oriented. No focal neurological deficits. Extremities: Symmetric 5 x 5 power. Skin: No rashes, lesions or ulcers Psychiatry: Judgement and insight appear normal. Mood & affect appropriate.     Data Reviewed: I have personally reviewed following labs and imaging studies  CBC: Recent Labs  Lab 05/02/19 0827 05/02/19 1745 05/03/19 0526  WBC 6.1 4.5 6.3  NEUTROABS 4.1 2.7  --   HGB 10.5* 9.6* 9.3*  HCT 32.0* 29.2* 29.1*  MCV 79.4* 79.8* 81.1  PLT 136* 155 387*   Basic Metabolic Panel: Recent Labs  Lab 05/02/19 0827 05/02/19 1745 05/03/19 0526 05/04/19 0555 05/05/19 0639  NA 143 139 142 142 140  K 3.4* 3.6 3.2* 3.0* 2.6*  CL 104 104 108 110 110  CO2 24 22 19* 22 20*  GLUCOSE 170* 191* 157* 157* 159*  BUN 50* 53* 47* 37* 29*  CREATININE 3.43* 3.53* 3.20* 3.15* 3.11*  CALCIUM 9.5 8.8* 8.4* 8.3* 8.2*   GFR: Estimated Creatinine Clearance: 20.8 mL/min (A) (by C-G formula based on SCr of 3.11 mg/dL (H)). Liver  Function Tests: Recent Labs  Lab 05/02/19 0827 05/02/19 1745 05/03/19 0526  AST 17 18 16   ALT 14 17 16   ALKPHOS 78 60 59  BILITOT 0.3 0.4 0.6  PROT 8.5* 8.0 7.2  ALBUMIN 4.1 3.9 3.6   No results for input(s): LIPASE, AMYLASE in the last 168 hours. No results for input(s): AMMONIA in the last 168 hours. Coagulation Profile: No results for input(s): INR, PROTIME in the last 168 hours. Cardiac Enzymes: No results for input(s): CKTOTAL, CKMB, CKMBINDEX, TROPONINI in the last 168 hours. BNP (last 3 results) No results for input(s): PROBNP in the last 8760 hours. HbA1C: No results for input(s): HGBA1C in the last 72 hours. CBG: Recent Labs  Lab 05/04/19 0733 05/04/19 1140 05/04/19 1721 05/04/19 2109 05/05/19 0754  GLUCAP 152* 207* 141* 172* 141*   Lipid Profile: No results for input(s): CHOL, HDL, LDLCALC, TRIG, CHOLHDL, LDLDIRECT in the last 72 hours. Thyroid Function Tests: No results for input(s): TSH, T4TOTAL, FREET4, T3FREE, THYROIDAB in the last 72 hours. Anemia Panel: No results for input(s): VITAMINB12, FOLATE, FERRITIN, TIBC, IRON, RETICCTPCT in the last 72 hours. Sepsis Labs: No results for input(s): PROCALCITON, LATICACIDVEN in the  last 168 hours.  Recent Results (from the past 240 hour(s))  SARS CORONAVIRUS 2 (TAT 6-24 HRS) Nasopharyngeal Nasopharyngeal Swab     Status: None   Collection Time: 05/03/19  1:49 AM   Specimen: Nasopharyngeal Swab  Result Value Ref Range Status   SARS Coronavirus 2 NEGATIVE NEGATIVE Final    Comment: (NOTE) SARS-CoV-2 target nucleic acids are NOT DETECTED. The SARS-CoV-2 RNA is generally detectable in upper and lower respiratory specimens during the acute phase of infection. Negative results do not preclude SARS-CoV-2 infection, do not rule out co-infections with other pathogens, and should not be used as the sole basis for treatment or other patient management decisions. Negative results must be combined with clinical  observations, patient history, and epidemiological information. The expected result is Negative. Fact Sheet for Patients: SugarRoll.be Fact Sheet for Healthcare Providers: https://www.woods-.com/ This test is not yet approved or cleared by the [place] FDA and  has been authorized for detection and/or diagnosis of SARS-CoV-2 by FDA under an Emergency Use Authorization (EUA). This EUA will remain  in effect (meaning this test can be used) for the duration of the COVID-19 declaration under Section 56 4(b)(1) of the Act, 21 U.S.C. section 360bbb-3(b)(1), unless the authorization is terminated or revoked sooner. Performed at [facility], [place] [address], [place], [place] [address]          Radiology Studies: US Renal  Result Date: 05/03/2019 CLINICAL DATA:  Elevated creatinine. EXAM: RENAL / URINARY TRACT ULTRASOUND COMPLETE COMPARISON:  CT 01/17/2019 FINDINGS: Right Kidney: Renal measurements: 10.1 x 5.3 x 6.3 cm = volume: 177 mL . Echogenicity within normal limits. No mass or hydronephrosis visualized. 3.4 cm cyst over the lower pole. Left Kidney: Renal measurements: 10.2 x 5.4 x 4.8 cm = volume: 140 mL. Echogenicity within normal limits. No mass or hydronephrosis visualized. 1.6 cm cyst over the lower pole. Bladder: Appears normal for degree of bladder distention. IMPRESSION: Normal size kidneys without hydronephrosis.  Bilateral renal cysts. Electronically Signed   By: [name] M.D.   On: 05/03/2019 17:26        Scheduled Meds: . allopurinol  100 mg Oral Daily  . amLODipine  5 mg Oral Daily  . ferrous sulfate  325 mg Oral QODAY  . glipiZIDE  2.5 mg Oral QAC breakfast  . guaiFENesin  600 mg Oral BID  . heparin  5,000 Units Subcutaneous Q8H  . insulin aspart  0-5 Units Subcutaneous QHS  . insulin aspart  0-9 Units Subcutaneous TID WC  . metoprolol tartrate  12.5 mg Oral BID  . pantoprazole  40 mg Oral Daily  .  umeclidinium bromide  1 puff Inhalation Daily  . vitamin B-12  1,000 mcg Oral Daily  . vitamin C  500 mg Oral Daily   Continuous Infusions: . sodium chloride Stopped (05/05/19 0707)     LOS: 2 days     Georgette Shell, MD [facility] If 7PM-7AM, please contact night-coverage [email or web address] Password [facility] 05/05/2019, 9:37 AM

## 2019-05-05 NOTE — Progress Notes (Signed)
CRITICAL VALUE ALERT  Critical Value:  Potassium 2.6 Date & Time Notied:  05/05/2019 @ 0800  Provider Notified: Dr. Rodena Piety  Orders Received/Actions taken: Orders received for oral potassium

## 2019-05-05 NOTE — Progress Notes (Signed)
   Vital Signs MEWS/VS Documentation      05/04/2019 2152 05/04/2019 2344 05/05/2019 0247 05/05/2019 0250   MEWS Score:  0  0  3  3   MEWS Score Color:  Green  Green  Yellow  Yellow   Resp:  20  -  -  16   Pulse:  100  93  (!) 101  (!) 107   BP:  (!) 190/88  (!) 160/82  (!) 177/79  (!) 166/84   Temp:  -  98.6 F (37 C)  (!) 101.6 F (38.7 C)  -   O2 Device:  Room Air  -  -  -           Don Perking 05/05/2019,4:26 AM

## 2019-05-05 NOTE — Progress Notes (Signed)
Richard Navarro has yellow mews score of 3 with an  elevated temp of 101.6 and BP of 166/ 84. No acute distress noted. [name] denies any discomfort, no nausea vomiting, diarrhea of chills when questioned. [facility] informed and new orders obtained. Will continue monitor.

## 2019-05-05 NOTE — Progress Notes (Signed)
   Vital Signs MEWS/VS Documentation      05/05/2019 0509 05/05/2019 0545 05/05/2019 0606 05/05/2019 0657   MEWS Score:  4  4  3  1    MEWS Score Color:  Red  Red  Yellow  Green   Resp:  20  20  20  20    Pulse:  (!) 115  (!) 113  (!) 113  (!) 110   BP:  (!) 160/76  (!) 155/78  (!) 160/72  (!) 165/70   Temp:  (!) 102.7 F (39.3 C)  (!) 101.8 F (38.8 C)  (!) 100.6 F (38.1 C)  100 F (37.8 C)   O2 Device:  Room Air  Room [employer] scored red mews of 4. No c/o voiced. Denies nausea vomiting chills when questioned. [facility] notified with new orders obtained. Will continue to monitor and assess patient needs and concerns.     Don Perking 05/05/2019,8:10 AM

## 2019-05-05 NOTE — Progress Notes (Signed)
HEMATOLOGY-ONCOLOGY PROGRESS NOTE  SUBJECTIVE: The patient was admitted due to worsening renal function.  Admission creatinine was 3.53.  BUN was also elevated at 53.  Patient reports that he has had some generalized weakness at home but otherwise has felt well.  Reports that his appetite has been good, but he does not drink a lot of fluids at home.  Prior to admission, he was not having any fevers or chills.  He denied having any chest pain, shortness of breath, cough.  Not abdominal pain, nausea, vomiting, constipation, diarrhea.  He is started to have fevers during his hospitalization.  He continues to state that he feels well overall however.  Blood cultures and urine culture are pending.  Renal function is slowly improving.  Creatinine today is down to 3.11 and BUN is down to 29.  Remains on IV fluids.  Renal ultrasound showed normal size kidneys without hydronephrosis and bilateral renal cyst.  REVIEW OF SYSTEMS:   Constitutional: Had fever up to 102.7.  Denies chills. Respiratory: Denies cough, dyspnea or wheezes Cardiovascular: Denies palpitation, chest discomfort Gastrointestinal:  Denies nausea, heartburn or change in bowel habits Skin: Denies abnormal skin rashes Lymphatics: Denies new lymphadenopathy or easy bruising Neurological:Denies numbness, tingling or new weaknesses Behavioral/Psych: Mood is stable, no new changes  Extremities: No lower extremity edema All other systems were reviewed with the patient and are negative.  I have reviewed the past medical history, past surgical history, social history and family history with the patient and they are unchanged from previous note.   PHYSICAL EXAMINATION: ECOG PERFORMANCE STATUS: 1 - Symptomatic but completely ambulatory  Vitals:   05/05/19 0657 05/05/19 0850  BP: (!) 165/70 (!) 162/86  Pulse: (!) 110 (!) 111  Resp: 20 17  Temp: 100 F (37.8 C) 99.8 F (37.7 C)  SpO2: 97% 98%   Filed Weights   05/04/19 0623 05/05/19 0509   Weight: 162 lb 3.2 oz (73.6 kg) 157 lb 12.8 oz (71.6 kg)    Intake/Output from previous day: 09/13 0701 - 09/14 0700 In: 720 [P.O.:720] Out: 825 [Urine:825]  GENERAL:alert, no distress and comfortable SKIN: skin color, texture, turgor are normal, no rashes or significant lesions EYES: normal, Conjunctiva are pink and non-injected, sclera clear OROPHARYNX:no exudate, no erythema and lips, buccal mucosa, and tongue normal  NECK: supple, thyroid normal size, non-tender, without nodularity LYMPH:  no palpable lymphadenopathy in the cervical, axillary or inguinal LUNGS: clear to auscultation and percussion with normal breathing effort HEART: regular rate & rhythm and no murmurs and no lower extremity edema ABDOMEN:abdomen soft, non-tender and normal bowel sounds Musculoskeletal:no cyanosis of digits and no clubbing  NEURO: alert & oriented x 3 with fluent speech, no focal motor/sensory deficits  LABORATORY DATA:  I have reviewed the data as listed CMP Latest Ref Rng & Units 05/05/2019 05/04/2019 05/03/2019  Glucose 70 - 99 mg/dL 159(H) 157(H) 157(H)  BUN 8 - 23 mg/dL 29(H) 37(H) 47(H)  Creatinine 0.61 - 1.24 mg/dL 3.11(H) 3.15(H) 3.20(H)  Sodium 135 - 145 mmol/L 140 142 142  Potassium 3.5 - 5.1 mmol/L 2.6(LL) 3.0(L) 3.2(L)  Chloride 98 - 111 mmol/L 110 110 108  CO2 22 - 32 mmol/L 20(L) 22 19(L)  Calcium 8.9 - 10.3 mg/dL 8.2(L) 8.3(L) 8.4(L)  Total Protein 6.5 - 8.1 g/dL - - 7.2  Total Bilirubin 0.3 - 1.2 mg/dL - - 0.6  Alkaline Phos 38 - 126 U/L - - 59  AST 15 - 41 U/L - - 16  ALT 0 -  44 U/L - - 16    Lab Results  Component Value Date   WBC 6.3 05/03/2019   HGB 9.3 (L) 05/03/2019   HCT 29.1 (L) 05/03/2019   MCV 81.1 05/03/2019   PLT 130 (L) 05/03/2019   NEUTROABS 2.7 05/02/2019    Ct Chest Wo Contrast  Result Date: 05/02/2019 CLINICAL DATA:  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 Ongoing oral  chemotherapy. Restaging. EXAM: CT CHEST WITHOUT CONTRAST TECHNIQUE: Multidetector CT imaging of the chest was performed following the standard protocol without IV contrast. COMPARISON:  01/17/2019 CT chest, abdomen and pelvis. FINDINGS: Cardiovascular: Normal heart size. No significant pericardial effusion/thickening. Three-vessel coronary atherosclerosis. Mildly atherosclerotic nonaneurysmal thoracic aorta. Normal caliber pulmonary arteries. Mediastinum/Nodes: No discrete thyroid nodules. Unremarkable esophagus. No axillary adenopathy. Mildly enlarged 1.1 cm subcarinal node (series 2/image 81), stable. No new pathologically enlarged mediastinal nodes. No discrete hilar adenopathy on this noncontrast scan. Lungs/Pleura: No pneumothorax. No pleural effusion. Moderate to severe centrilobular and paraseptal emphysema. Irregular right lower lobe lung mass measuring 5.9 x 4.4 cm peripherally (series 5/image 112), previously 5.0 x 4.3 cm on 01/17/2019, increased. Central component of the right lower lobe irregular mass measures 3.6 x 2.4 cm (series 6/image 102), previously 3.4 x 2.4 cm, slightly increased. No acute consolidative airspace disease or new significant pulmonary nodules. Upper abdomen: Small hiatal hernia. Hypodense 1.2 cm segment 7 right liver lesion (series 2/image 119), not appreciably changed. Musculoskeletal: Stable 1.5 cm inferior T7 vertebral sclerotic lesion (series 7/image 93). No new focal osseous lesions. IMPRESSION: 1. Irregular right lower lobe lung mass is mildly increased in size since 01/17/2019 chest CT. 2. Mild subcarinal lymphadenopathy is stable. No new or progressive thoracic adenopathy. 3. Sclerotic T7 vertebral osseous lesion is stable. No new osseous lesions in the chest. 4. Small low-attenuation segment 7 right liver lesion is not appreciably changed. Aortic Atherosclerosis (ICD10-I70.0) and Emphysema (ICD10-J43.9). Electronically Signed   By: Ilona Sorrel M.D.   On: 05/02/2019 10:47    US Renal  Result Date: 05/03/2019 CLINICAL DATA:  Elevated creatinine. EXAM: RENAL / URINARY TRACT ULTRASOUND COMPLETE COMPARISON:  CT 01/17/2019 FINDINGS: Right Kidney: Renal measurements: 10.1 x 5.3 x 6.3 cm = volume: 177 mL . Echogenicity within normal limits. No mass or hydronephrosis visualized. 3.4 cm cyst over the lower pole. Left Kidney: Renal measurements: 10.2 x 5.4 x 4.8 cm = volume: 140 mL. Echogenicity within normal limits. No mass or hydronephrosis visualized. 1.6 cm cyst over the lower pole. Bladder: Appears normal for degree of bladder distention. IMPRESSION: Normal size kidneys without hydronephrosis.  Bilateral renal cysts. Electronically Signed   By: [name] M.D.   On: 05/03/2019 17:26    ASSESSMENT AND PLAN: 1.  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  Acute kidney injury on stage III CKD 3.  Fever of unclear etiology 4.  Hypokalemia 5.  Normocytic anemia 6.  Mild thrombocytopenia 7.  Diabetes 8.  Hypertension 5.  COPD  -Patient had a restaging CT scan of the chest prior to admission.  Overall stable.  Slight increase in the lung nodule.  Will discuss further as an outpatient.   -Continue to hold Afinitor.  There are case reports of acute tubular necrosis with Afinitor.  The patient also had poor fluid intake prior to admission and has underlying diabetes and CKD.  AKI improved with holding medication and hydration.  Renal ultrasound did not show hydronephrosis.  Continue hydration. -The patient is having fevers.  Source is unclear.  Await blood and urine culture.  Not currently on antibiotics.  Will defer decision regarding antibiotics to hospitalist. -Replete potassium per hospitalist -Anemia and thrombocytopenia are likely due to recent chemotherapy.  No transfusion is indicated.  Continue to monitor CBC daily. -Management of diabetes, hypertension, and COPD per hospitalist.    LOS: 2 days   Mikey Bussing, DNP, AGPCNP-BC, AOCNP 05/05/19

## 2019-05-06 LAB — BASIC METABOLIC PANEL
Anion gap: 12 (ref 5–15)
BUN: 27 mg/dL — ABNORMAL HIGH (ref 8–23)
CO2: 20 mmol/L — ABNORMAL LOW (ref 22–32)
Calcium: 8.4 mg/dL — ABNORMAL LOW (ref 8.9–10.3)
Chloride: 108 mmol/L (ref 98–111)
Creatinine, Ser: 3.1 mg/dL — ABNORMAL HIGH (ref 0.61–1.24)
GFR calc Af Amer: 22 mL/min — ABNORMAL LOW (ref >60)
GFR calc non Af Amer: 19 mL/min — ABNORMAL LOW (ref >60)
Glucose, Bld: 129 mg/dL — ABNORMAL HIGH (ref 70–99)
Potassium: 2.9 mmol/L — ABNORMAL LOW (ref 3.5–5.1)
Sodium: 140 mmol/L (ref 135–145)

## 2019-05-06 LAB — GLUCOSE, CAPILLARY
Glucose-Capillary: 111 mg/dL — ABNORMAL HIGH (ref 70–99)
Glucose-Capillary: 166 mg/dL — ABNORMAL HIGH (ref 70–99)

## 2019-05-06 MED ORDER — CIPROFLOXACIN HCL 250 MG PO TABS: 250.0000 mg | ORAL_TABLET | Freq: Two times a day (BID) | ORAL | 0 refills | Status: AC

## 2019-05-06 MED ORDER — CALCIUM CARBONATE ANTACID 1250 MG/5ML PO SUSP: 500.0000 mg | Freq: Four times a day (QID) | ORAL | Status: DC | PRN

## 2019-05-06 MED ORDER — GLIPIZIDE 5 MG PO TABS: 2.5000 mg | ORAL_TABLET | Freq: Every day | ORAL | 2 refills | Status: DC

## 2019-05-06 MED ORDER — CIPROFLOXACIN HCL 250 MG PO TABS: 250.0000 mg | ORAL_TABLET | Freq: Two times a day (BID) | ORAL | 0 refills | Status: DC

## 2019-05-06 MED ORDER — POTASSIUM CHLORIDE CRYS ER 20 MEQ PO TBCR
40.0000 meq | EXTENDED_RELEASE_TABLET | Freq: Once | ORAL | Status: AC
  Administered 2019-05-06: 40 meq via ORAL
  Filled 2019-05-06: qty 2

## 2019-05-06 MED ORDER — METOPROLOL TARTRATE 25 MG PO TABS: 12.5000 mg | ORAL_TABLET | Freq: Two times a day (BID) | ORAL | 2 refills | Status: DC

## 2019-05-06 MED ORDER — SODIUM CHLORIDE 0.9 % IV SOLN
1.0000 g | INTRAVENOUS | Status: DC
  Administered 2019-05-06: 1 g via INTRAVENOUS
  Filled 2019-05-06: qty 1

## 2019-05-06 NOTE — Care Management Important Message (Signed)
Important Message  Patient Details IM Letter given to Marney Doctor RN to present to the Patient Name: Richard Navarro MRN: [id number] Date of Birth: 03-10-1946   Medicare Important Message Given:  Yes     Kerin Salen 05/06/2019, 9:50 AM

## 2019-05-07 ENCOUNTER — Telehealth: Payer: Self-pay | Admitting: Internal Medicine

## 2019-05-07 ENCOUNTER — Telehealth: Payer: Self-pay

## 2019-05-07 LAB — URINE CULTURE: Culture: >=100000 — AB

## 2019-05-07 NOTE — Telephone Encounter (Signed)
Pts wife calling to make HOSP FU for pt. Ppw for pt says to be seen by 9/17. Pt was released 9/15. Nothing available. Please advise.

## 2019-05-07 NOTE — Telephone Encounter (Signed)
Patient was contacted and scheduled. No need to route note

## 2019-05-07 NOTE — Telephone Encounter (Signed)
Can work in Monday 05/12/19 @ 4:00 PM. Please call to schedule, thanks!

## 2019-05-07 NOTE — Telephone Encounter (Signed)
Scheduled appt per 9/14 sch message - pt aware of appt date and time

## 2019-05-09 LAB — CULTURE, BLOOD (ROUTINE X 2)
Culture: NO GROWTH
Culture: NO GROWTH
Special Requests: ADEQUATE
Special Requests: ADEQUATE

## 2019-05-12 ENCOUNTER — Encounter: Payer: Self-pay | Admitting: Family Medicine

## 2019-05-12 ENCOUNTER — Ambulatory Visit (INDEPENDENT_AMBULATORY_CARE_PROVIDER_SITE_OTHER): Payer: Medicare Other | Admitting: Family Medicine

## 2019-05-12 ENCOUNTER — Other Ambulatory Visit: Payer: Self-pay

## 2019-05-12 VITALS — BP 130/70 | HR 77 | Temp 98.0°F | Ht 75.0 in | Wt 163.0 lb

## 2019-05-12 DIAGNOSIS — F3342 Major depressive disorder, recurrent, in full remission: Secondary | ICD-10-CM | POA: Diagnosis not present

## 2019-05-12 DIAGNOSIS — I10 Essential (primary) hypertension: Secondary | ICD-10-CM

## 2019-05-12 DIAGNOSIS — E1159 Type 2 diabetes mellitus with other circulatory complications: Secondary | ICD-10-CM

## 2019-05-12 DIAGNOSIS — C3491 Malignant neoplasm of unspecified part of right bronchus or lung: Secondary | ICD-10-CM | POA: Diagnosis not present

## 2019-05-12 DIAGNOSIS — Z23 Encounter for immunization: Secondary | ICD-10-CM | POA: Diagnosis not present

## 2019-05-12 DIAGNOSIS — N183 Chronic kidney disease, stage 3 unspecified: Secondary | ICD-10-CM

## 2019-05-12 DIAGNOSIS — E1122 Type 2 diabetes mellitus with diabetic chronic kidney disease: Secondary | ICD-10-CM | POA: Diagnosis not present

## 2019-05-12 DIAGNOSIS — R944 Abnormal results of kidney function studies: Secondary | ICD-10-CM

## 2019-05-12 DIAGNOSIS — N390 Urinary tract infection, site not specified: Secondary | ICD-10-CM

## 2019-05-12 DIAGNOSIS — I152 Hypertension secondary to endocrine disorders: Secondary | ICD-10-CM

## 2019-05-12 NOTE — Assessment & Plan Note (Signed)
Stage IV lung cancer- patient wants to wait to discuss results with oncology-has visit within 10 days.  On my view appears irregular right lower lobe mass is mildly increased in size compared to May 29 chest CT.  With recent acute on chronic kidney disease- unclear if Afinitor will be an option for him ongoing.

## 2019-05-12 NOTE — Progress Notes (Signed)
Phone [phone]   Subjective:  Richard Navarro is a 73 y.o. year old very pleasant male patient who presents for transitional care management and hospital follow up for Acute Renal Failure. Patient was hospitalized from 05/02/2019 to 05/06/2019. A TCM phone call was not completed.    See problem oriented charting as well ROS-elevated temperature this morning but was under covers-no fever otherwise.  No chest pain or shortness of breath.  Continues to have urinary frequency but improved overall.  No cough or congestion reported.  Some confusion per wife  Past Medical History-  Patient Active Problem List   Diagnosis Date Noted  . 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 Primary cancer of right lung ([place]) 12/22/2016    Priority: High  . Diabetes mellitus with renal manifestation (HCC)     Priority: High  . CKD (chronic kidney disease), stage III ([employer]) 03/04/2018    Priority: Medium  . Myalgia due to statin 03/04/2018    Priority: Medium  . COPD (chronic obstructive pulmonary disease) ([place]) 12/01/2016    Priority: Medium  . PTSD (post-traumatic stress disorder) 12/07/2015    Priority: Medium  . Erectile dysfunction 04/09/2014    Priority: Medium  . Former smoker 03/26/2014    Priority: Medium  . Hypertension associated with diabetes ([place])     Priority: Medium  . Gout     Priority: Medium  . Depression     Priority: Medium  . Hyperlipidemia     Priority: Medium  . Pneumonia 08/09/2017    Priority: Low  . Encounter for antineoplastic chemotherapy 01/10/2017    Priority: Low  . Goals of care, counseling/discussion 01/10/2017    Priority: Low  . Prostate cancer screening 04/17/2014    Priority: Low  . Arthritis 03/26/2014    Priority: Low  . History of adenomatous polyp of colon 03/26/2014    Priority: Low  . GERD (gastroesophageal reflux disease)     Priority: Low  . Thrombocytopenia ([place])   . ARF (acute renal failure) ([place]) 05/03/2019  .  Anemia 05/07/2018  . Tachycardia 08/09/2017  . AKI (acute kidney injury) ([place]) 08/09/2017  . Urinary frequency 04/27/2017    Medications- reviewed and updated  A medical reconciliation was performed comparing current medicines to hospital discharge medications. Current Outpatient Medications  Medication Sig Dispense Refill  . albuterol (VENTOLIN HFA) 108 (90 Base) MCG/ACT inhaler Inhale 2 puffs into the lungs every 4 (four) hours as needed for wheezing or shortness of breath. 3 Inhaler 2  . allopurinol (ZYLOPRIM) 100 MG tablet Take 1 tablet (100 mg total) by mouth daily. 90 tablet 3  . amLODipine (NORVASC) 5 MG tablet Take 1 tablet (5 mg total) by mouth daily. 90 tablet 3  . Calcium Carbonate Antacid (CALCIUM CARBONATE, DOSED IN MG ELEMENTAL CALCIUM,) 1250 MG/5ML SUSP Take 5 mLs (500 mg of elemental calcium total) by mouth every 6 (six) hours as needed for indigestion. 450 mL   . Continuous Blood Gluc Sensor (FREESTYLE LIBRE 14 DAY SENSOR) MISC Apply 1 each topically See admin instructions. 2 each 2  . diclofenac sodium (VOLTAREN) 1 % GEL APPLY 2 GRAMS TOPICALLY FOUR TIMES A DAY AS NEEDED FOR LEFT SHOULDER ARTHRITIS (Patient taking differently: Apply 2 g topically 4 (four) times daily as needed. FOR LEFT SHOULDER ARTHRITIS) 300 g 3  . Ferrous Sulfate (IRON) 325 (65 Fe) MG TABS Take 1 tablet by mouth every other day.     Marland Kitchen glipiZIDE (GLUCOTROL) 5 MG tablet  Take 0.5 tablets (2.5 mg total) by mouth daily before breakfast. 30 tablet 2  . glucose blood test strip Use to test blood sugar twice a day 200 each 3  . guaiFENesin (MUCINEX) 600 MG 12 hr tablet Take 600 mg by mouth 2 (two) times daily.     . metoprolol tartrate (LOPRESSOR) 25 MG tablet Take 0.5 tablets (12.5 mg total) by mouth 2 (two) times daily. 60 tablet 2  . pantoprazole (PROTONIX) 40 MG tablet Take 1 tablet (40 mg total) by mouth daily. 90 tablet 3  . Tiotropium Bromide Monohydrate (SPIRIVA RESPIMAT) 2.5 MCG/ACT AERS Inhale 2 puffs  into the lungs daily. 12 g 1  . vitamin B-12 (CYANOCOBALAMIN) 1000 MCG tablet Take 1,000 mcg by mouth daily.     . vitamin C (ASCORBIC ACID) 500 MG tablet Take 500 mg by mouth daily.     No current facility-administered medications for this visit.    Objective  Objective:  BP 130/70   Pulse 77   Temp 98 F (36.7 C)   Ht 6\' 3"  (1.905 m)   Wt 163 lb (73.9 kg)   SpO2 99%   BMI 20.37 kg/m  Gen: NAD, resting comfortably CV: RRR no murmurs rubs or gallops Lungs: CTAB no crackles, wheeze, rhonchi Abdomen: soft/nontender including no suprapubic pain/nondistended/normal bowel sounds.  Ext: no edema Skin: warm, dry Neuro: No gross deficits, do note a difference in speed of patient's cognition compared to prior   Assessment and Plan:   # Hospital Follow-up S:Sent to the ED for elevated creatinine by oncology on September 11- found to have acute renal failure with GFR as low as 19 down from 48-week prior, and weakness. Meds d/c while in the ED - Afinitor, Glimepiride, HCTZ, and Metformin.  Patient states he had never started hydrochlorothiazide anyway.  GFR before hospitalization as high as 48 and during hospitalization as low as 19 improving to 22 before discharge.  Patient had renal ultrasound without any evidence of hydronephrosis.   meds added - Glipizide 5 mg 1/2 tablet daily and Metoprolol 25 mg 1/2 tablet BID. Also started on Tums.   Patient developed a fever while hospitalized up to 102.7.  COVID-19 testing was negative.  Discovered to have Klebsiella UTI.  Patient was also noted to have 20,000 colonies of enterococcus.  Discharge summary states he was treated with Rocephin and cephalexin- but it appears patient was sent home on ciprofloxacin.  Does not appear he was treated for enterococcus. A/P:   AKI on top of stage III kidney disease- unclear etiology but likely multifactorial between Afinitor, metformin, diabetes, UTI.  Patient never started hydrochlorothiazide so not likely a  contributing factor.  Contrast could have contributed has had MRI of the abdomen July 13 with and without contrast but later GFR was in 48 range in August so doubt that was the cause.  He had a CT of the chest prior to admission but that was without contrast.  Update BMP today.  Patient also had significant hypokalemia- does not appear ever adequately repleted.  Also have low magnesium which was repleted.  Updating electrolytes today. -He asks about eating whole-grain bread-we discussed concern is for phosphorus and potassium- since GFR may very well be improving and has had low potassium I was not opposed to whole-grain bread at this time  UTI- appears patient likely adequately treated for Klebsiella UTI.  Also had enterococcus but low levels 20,000 colonies- we will repeat urine culture and if still present we will treat  that portion.  Stage IV lung cancer- patient wants to wait to discuss results with oncology-has visit within 10 days.  On my view appears irregular right lower lobe mass is mildly increased in size compared to May 29 chest CT.  With recent acute on chronic kidney disease- unclear if Afinitor will be an option for him ongoing.  Diabetes- glimepiride and metformin were stopped.  Patient was started on glipizide 2.5 mg.  He only checks his blood sugar today and was from 2 25-55- had not checked since being home from hospital-we opted to get some more home numbers and discussed potentially using insulin.  Would need to be very cautious given renal function.  For hypertension-well controlled.  Blood pressure well controlled on Norvasc as well as Lopressor 12.5 mg twice daily.  #Elevated PHQ 9 S: A LOT of stress with recent illnesses.  Patient adamant not dealing with depression at present Depression screen [facility] 2/9 05/12/2019  Decreased Interest 3  Down, Depressed, Hopeless 0  PHQ - 2 Score 3  Altered sleeping 3  Tired, decreased energy 3  Change in appetite 2  Feeling bad or failure  about yourself  0  Trouble concentrating 2  Moving slowly or fidgety/restless 2  Suicidal thoughts 0  PHQ-9 Score 15  Difficult doing work/chores Somewhat difficult  A/P: Patient is very firm that depression is in remission and states symptoms listed are related to recent medical illnesses/hospitalization.  He would like to continue without medication and will let me know if depression worsens. -counseling today on recent stressors  Recommended follow up: November follow-up already planned Future Appointments  Date Time Provider [place]  05/20/2019 11:30 AM [facility] 2 [facility] None  05/20/2019 12:00 PM Curt Bears, MD [facility] None  06/23/2019  3:40 PM [name], MD [facility]  07/15/2019  9:00 AM Marzetta Board, DPM [facility]    Lab/Order associations:   ICD-10-CM   1. Decreased GFR  R94.4 CBC    Basic metabolic panel  2. Type 2 diabetes mellitus with stage 3 chronic kidney disease, without long-term current use of insulin (HCC)  E11.22 CBC   H54.5 Basic metabolic panel    Lipid panel  3. CKD (chronic kidney disease), stage III (HCC)  N18.3   4. Hypertension associated with diabetes ([place])  E11.59    I10   5. Urinary tract infection without hematuria, site unspecified  N39.0 Urine Culture    Urine Culture  6. Primary cancer of right lung (HCC)  C34.91   7. Need for influenza vaccination  Z23 Flu Vaccine QUAD High Dose([place])  8. Recurrent major depressive disorder, in full remission ([place])  F33.42    Return precautions advised.  [name], MD

## 2019-05-12 NOTE — Assessment & Plan Note (Signed)
For hypertension-well controlled.  Blood pressure well controlled on Norvasc as well as Lopressor 12.5 mg twice daily.

## 2019-05-12 NOTE — Discharge Summary (Signed)
Physician Discharge Summary  Richard Navarro OEV:[id number] DOB: Jan 30, 1946 DOA: 05/02/2019  PCP: [name], MD  Admit date: 05/02/2019 Discharge date: 05/12/2019  Admitted From: Home Disposition: Home Recommendations for Outpatient Follow-up:  1. Follow up with PCP in 1-2 weeks 2. Please obtain BMP/CBC in one week 3. Follow-up with oncology   Home Health none Equipment/Devices: None Discharge Condition: Stable CODE STATUS: DNR Diet recommendation: Cardiac  Brief/Interim Summary:73 y.o.malewith medical history significant ofchronic kidney disease stage III, GERD, diabetes, hypertension, hyperlipidemia, metastatic low-grade neuroendocrine carcinoma who was seen by his PCP and noted to have worsening renal function. His creatinine has doubled in the last week or so. Patient was sent over where he was found to have a creatinine of 3.5. He has been weak. Patient has apparently been drinking a lot of water but no improvement. He denied any recent nausea vomiting or diarrhea no fever or chills no sick contacts. Patient also has had normal control of his blood sugar with no episodes of hypoglycemia. He is being admitted with acute on chronic kidney failure for evaluation and treatment..  ED Course:Temperature is 98.7 blood pressure 162/86 pulse 104 respiratory rate of 18 oxygen sat 99% on room air. White count 4.5 hemoglobin 9.6 and platelet count of 155. Sodium 139 potassium 3.6 chloride 104 CO2 22 BUN 53 and creatinine 3.53. Calcium 8.8 and glucose 191. Urinalysis is negative. COVID-19 screen negative. CT chest without contrast showed irregular right lower lobe lung mass consistent with previous [name] known carcinoma. Literally unchanged from previous. Patient is being admitted for management of acute on chronic renal failure.   Discharge Diagnoses:  Principal Problem:   ARF (acute renal failure) (HCC) Active Problems:   Diabetes mellitus with renal manifestation  (HCC)   Hypertension associated with diabetes ([name])   GERD (gastroesophageal reflux disease)   Hyperlipidemia   COPD (chronic obstructive pulmonary disease) (HCC)   AKI (acute kidney injury) ([name])   CKD (chronic kidney disease), stage III (HCC)   Anemia   Thrombocytopenia (HCC)  #1 AKI on CKD stage III-this has been thought to be a combination of him being on hydrochlorothiazide metformin and Afinitor.  All these medications were on hold during the hospital stay including Amaryl.  He was treated with IV fluids.  His renal functions did get better but did not come back to baseline.  Renal ultrasound does not show any evidence of hydronephrosis.  Since it was trending down I I discharged him and to have close follow-up with his oncologist and PCP.  #2 stage IV lung cancer his oncologist is Dr. [name] who sent the patient to the ER with increased creatinine. Patient had a CT of the chest 05/02/2019 without contrast.Irregular right lower lobe lung mass is mildly increased in size since 01/17/2019 chest CT. Mild subcarinal lymphadenopathy is stable. No new or progressive thoracic adenopathy. Sclerotic T7 vertebral osseous lesion is stable. No new osseous lesions in the chest. Small low-attenuation segment 7 right liver lesion is not appreciably changed. Afinitor was on hold during the hospital stay he will follow-up with oncology.  Patient was seen in consultation by oncology.  #3 type 2 diabetes blood sugar was elevated hence he was started on glipizide.    #4 hypertension continue Norvasc. Start Lopressor 12.5 twice daily.  #5 COPD stable continue Mucinex and inhaler.  #6 GERD stable on Protonix  #7 severe hypokalemia with a K of 2.6 with hypomagnesemia magnesium 1.6 this was replaced.   #7 fever patient spiked a temp  of 102.7 became very tachycardic and hypotensive.  Blood culture urine culture chest x-ray was done.  Urine culture grew Klebsiella or more than 100,000 colonies.   He had complains of frequent urination.  He was started on Rocephin.  He was discharged home on Keflex and is stable condition.   Estimated body mass index is 19.72 kg/m as calculated from the following:   Height as of this encounter: 6\' 3"  (1.905 m).   Weight as of this encounter: 71.6 kg.  Discharge Instructions  Discharge Instructions    Call MD for:  difficulty breathing, headache or visual disturbances   Complete by: As directed    Call MD for:  persistant nausea and vomiting   Complete by: As directed    Call MD for:  redness, tenderness, or signs of infection (pain, swelling, redness, odor or green/yellow discharge around incision site)   Complete by: As directed    Diet - low sodium heart healthy   Complete by: As directed    Increase activity slowly   Complete by: As directed      Allergies as of 05/06/2019      Reactions   Lisinopril Swelling   Angioedema- on this and afinitor same time   Simvastatin Other (See Comments)   Joint ache      Medication List    STOP taking these medications   Afinitor 7.5 MG tablet Generic drug: everolimus   glimepiride 4 MG tablet Commonly known as: AMARYL   hydrochlorothiazide 12.5 MG capsule Commonly known as: MICROZIDE   metFORMIN 500 MG tablet Commonly known as: GLUCOPHAGE     TAKE these medications   albuterol 108 (90 Base) MCG/ACT inhaler Commonly known as: VENTOLIN HFA Inhale 2 puffs into the lungs every 4 (four) hours as needed for wheezing or shortness of breath.   allopurinol 100 MG tablet Commonly known as: ZYLOPRIM Take 1 tablet (100 mg total) by mouth daily.   amLODipine 5 MG tablet Commonly known as: NORVASC Take 1 tablet (5 mg total) by mouth daily.   calcium carbonate (dosed in mg elemental calcium) 1250 MG/5ML Susp Take 5 mLs (500 mg of elemental calcium total) by mouth every 6 (six) hours as needed for indigestion.   diclofenac sodium 1 % Gel Commonly known as: Voltaren APPLY 2 GRAMS TOPICALLY  FOUR TIMES A DAY AS NEEDED FOR LEFT SHOULDER ARTHRITIS What changed:   how much to take  how to take this  when to take this  reasons to take this  additional instructions   FreeStyle Libre 14 Day Sensor Misc Apply 1 each topically See admin instructions.   glipiZIDE 5 MG tablet Commonly known as: GLUCOTROL Take 0.5 tablets (2.5 mg total) by mouth daily before breakfast.   glucose blood test strip Use to test blood sugar twice a day   guaiFENesin 600 MG 12 hr tablet Commonly known as: MUCINEX Take 600 mg by mouth 2 (two) times daily.   Iron 325 (65 Fe) MG Tabs Take 1 tablet by mouth every other day. Notes to patient: Continue your every other day schedule   metoprolol tartrate 25 MG tablet Commonly known as: LOPRESSOR Take 0.5 tablets (12.5 mg total) by mouth 2 (two) times daily.   pantoprazole 40 MG tablet Commonly known as: Protonix Take 1 tablet (40 mg total) by mouth daily.   Spiriva Respimat 2.5 MCG/ACT Aers Generic drug: Tiotropium Bromide Monohydrate Inhale 2 puffs into the lungs daily.   vitamin B-12 1000 MCG tablet Commonly known as:  CYANOCOBALAMIN Take 1,000 mcg by mouth daily.   vitamin C 500 MG tablet Commonly known as: ASCORBIC ACID Take 500 mg by mouth daily.     ASK your doctor about these medications   ciprofloxacin 250 MG tablet Commonly known as: CIPRO Take 1 tablet (250 mg total) by mouth 2 (two) times daily for 4 days. Ask about: Should I take this medication?      Follow-up Information    [name], MD Follow up.   Specialty: Family Medicine Why: Please make appointment to follow-up on BMP 05/08/2019.  Contact information: [place] [address] [phone]          Allergies  Allergen Reactions  . Lisinopril Swelling    Angioedema- on this and afinitor same time  . Simvastatin Other (See Comments)    Joint ache    Consultations: Oncology  Procedures/Studies: Dg Chest 1 View  Result  Date: 05/05/2019 CLINICAL DATA:  Fever. EXAM: CHEST  1 VIEW COMPARISON:  Radiograph July 24, 2018. CT scan of October 08, 2018 and May 02, 2019. FINDINGS: The heart size and mediastinal contours are within normal limits. No pneumothorax is noted. Left lung is clear. Right basilar opacity is noted consistent with mass and associated atelectasis. The visualized skeletal structures are unremarkable. IMPRESSION: Right basilar opacity is noted consistent with right lower lobe mass and associated atelectasis. Electronically Signed   By: Marijo Conception M.D.   On: 05/05/2019 10:21   Ct Chest Wo Contrast  Result Date: 05/02/2019 CLINICAL DATA:  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 Ongoing oral chemotherapy. Restaging. EXAM: CT CHEST WITHOUT CONTRAST TECHNIQUE: Multidetector CT imaging of the chest was performed following the standard protocol without IV contrast. COMPARISON:  01/17/2019 CT chest, abdomen and pelvis. FINDINGS: Cardiovascular: Normal heart size. No significant pericardial effusion/thickening. Three-vessel coronary atherosclerosis. Mildly atherosclerotic nonaneurysmal thoracic aorta. Normal caliber pulmonary arteries. Mediastinum/Nodes: No discrete thyroid nodules. Unremarkable esophagus. No axillary adenopathy. Mildly enlarged 1.1 cm subcarinal node (series 2/image 81), stable. No new pathologically enlarged mediastinal nodes. No discrete hilar adenopathy on this noncontrast scan. Lungs/Pleura: No pneumothorax. No pleural effusion. Moderate to severe centrilobular and paraseptal emphysema. Irregular right lower lobe lung mass measuring 5.9 x 4.4 cm peripherally (series 5/image 112), previously 5.0 x 4.3 cm on 01/17/2019, increased. Central component of the right lower lobe irregular mass measures 3.6 x 2.4 cm (series 6/image 102), previously 3.4 x 2.4 cm, slightly increased. No acute consolidative airspace disease  or new significant pulmonary nodules. Upper abdomen: Small hiatal hernia. Hypodense 1.2 cm segment 7 right liver lesion (series 2/image 119), not appreciably changed. Musculoskeletal: Stable 1.5 cm inferior T7 vertebral sclerotic lesion (series 7/image 93). No new focal osseous lesions. IMPRESSION: 1. Irregular right lower lobe lung mass is mildly increased in size since 01/17/2019 chest CT. 2. Mild subcarinal lymphadenopathy is stable. No new or progressive thoracic adenopathy. 3. Sclerotic T7 vertebral osseous lesion is stable. No new osseous lesions in the chest. 4. Small low-attenuation segment 7 right liver lesion is not appreciably changed. Aortic Atherosclerosis (ICD10-I70.0) and Emphysema (ICD10-J43.9). Electronically Signed   By: Ilona Sorrel M.D.   On: 05/02/2019 10:47   US Renal  Result Date: 05/03/2019 CLINICAL DATA:  Elevated creatinine. EXAM: RENAL / URINARY TRACT ULTRASOUND COMPLETE COMPARISON:  CT 01/17/2019 FINDINGS: Right Kidney: Renal measurements: 10.1 x 5.3 x 6.3 cm = volume: 177 mL . Echogenicity within normal limits. No mass or  hydronephrosis visualized. 3.4 cm cyst over the lower pole. Left Kidney: Renal measurements: 10.2 x 5.4 x 4.8 cm = volume: 140 mL. Echogenicity within normal limits. No mass or hydronephrosis visualized. 1.6 cm cyst over the lower pole. Bladder: Appears normal for degree of bladder distention. IMPRESSION: Normal size kidneys without hydronephrosis.  Bilateral renal cysts. Electronically Signed   By: [name] M.D.   On: 05/03/2019 17:26    (Echo, Carotid, EGD, Colonoscopy, ERCP)    Subjective:  Patient resting in bed reports feeling better denies any shortness of breath Discharge Exam: Vitals:   05/06/19 1408 05/06/19 1444  BP: (!) 152/107 (!) 163/72  Pulse: 98 94  Resp: 17   Temp: 99.4 F (37.4 C)   SpO2: 95%    Vitals:   05/06/19 0457 05/06/19 0900 05/06/19 1408 05/06/19 1444  BP: (!) 154/75  (!) 152/107 (!) 163/72  Pulse: 85  98 94   Resp: 18  17   Temp: 98.9 F (37.2 C)  99.4 F (37.4 C)   TempSrc: [place]   SpO2:  97% 95%   Weight:      Height:        General: Pt is alert, awake, not in acute distress Cardiovascular: RRR, S1/S2 +, no rubs, no gallops Respiratory: CTA bilaterally, no wheezing, no rhonchi Abdominal: Soft, NT, ND, bowel sounds + Extremities: no edema, no cyanosis    The results of significant diagnostics from this hospitalization (including imaging, microbiology, ancillary and laboratory) are listed below for reference.     Microbiology: Recent Results (from the past 240 hour(s))  SARS CORONAVIRUS 2 (TAT 6-24 HRS) Nasopharyngeal Nasopharyngeal Swab     Status: None   Collection Time: 05/03/19  1:49 AM   Specimen: Nasopharyngeal Swab  Result Value Ref Range Status   SARS Coronavirus 2 NEGATIVE NEGATIVE Final    Comment: (NOTE) SARS-CoV-2 target nucleic acids are NOT DETECTED. The SARS-CoV-2 RNA is generally detectable in upper and lower respiratory specimens during the acute phase of infection. Negative results do not preclude SARS-CoV-2 infection, do not rule out co-infections with other pathogens, and should not be used as the sole basis for treatment or other patient management decisions. Negative results must be combined with clinical observations, patient history, and epidemiological information. The expected result is Negative. Fact Sheet for Patients: SugarRoll.be Fact Sheet for Healthcare Providers: https://www.woods-.com/ This test is not yet approved or cleared by the [place] FDA and  has been authorized for detection and/or diagnosis of SARS-CoV-2 by FDA under an Emergency Use Authorization (EUA). This EUA will remain  in effect (meaning this test can be used) for the duration of the COVID-19 declaration under Section 56 4(b)(1) of the Act, 21 U.S.C. section 360bbb-3(b)(1), unless the authorization is terminated  or revoked sooner. Performed at [facility], [place] [address] [place], [place] [address]   Culture, Urine     Status: Abnormal   Collection Time: 05/04/19  9:27 AM   Specimen: Urine, Clean Catch  Result Value Ref Range Status   Specimen Description   Final    URINE, CLEAN CATCH Performed at [facility], [place] [address] [place], [place] [address]    Special Requests   Final    NONE Performed at [facility], [place] [address], [place], [place] [address]    Culture (A)  Final    >=100,000 COLONIES/mL KLEBSIELLA PNEUMONIAE 20,000 COLONIES/mL ENTEROCOCCUS FAECALIS    Report Status 05/07/2019 FINAL  Final  Organism ID, Bacteria KLEBSIELLA PNEUMONIAE (A)  Final   Organism ID, Bacteria ENTEROCOCCUS FAECALIS (A)  Final      Susceptibility   Enterococcus faecalis - MIC*    AMPICILLIN <=2 SENSITIVE Sensitive     LEVOFLOXACIN 1 SENSITIVE Sensitive     NITROFURANTOIN <=16 SENSITIVE Sensitive     VANCOMYCIN 2 SENSITIVE Sensitive     * 20,000 COLONIES/mL ENTEROCOCCUS FAECALIS   Klebsiella pneumoniae - MIC*    AMPICILLIN >=32 RESISTANT Resistant     CEFAZOLIN <=4 SENSITIVE Sensitive     CEFTRIAXONE <=1 SENSITIVE Sensitive     CIPROFLOXACIN <=0.25 SENSITIVE Sensitive     GENTAMICIN <=1 SENSITIVE Sensitive     IMIPENEM <=0.25 SENSITIVE Sensitive     NITROFURANTOIN 64 INTERMEDIATE Intermediate     TRIMETH/SULFA <=20 SENSITIVE Sensitive     AMPICILLIN/SULBACTAM 4 SENSITIVE Sensitive     PIP/TAZO <=4 SENSITIVE Sensitive     Extended ESBL NEGATIVE Sensitive     * >=100,000 COLONIES/mL KLEBSIELLA PNEUMONIAE  Culture, blood (routine x 2)     Status: None   Collection Time: 05/04/19 10:44 AM   Specimen: BLOOD  Result Value Ref Range Status   Specimen Description   Final    BLOOD LEFT WRIST Performed at [place] [address], [place], [place] [address]    Special Requests   Final    BOTTLES DRAWN AEROBIC AND ANAEROBIC  Blood Culture adequate volume Performed at [place] [address], [place], [place] [address]    Culture   Final    NO GROWTH 5 DAYS Performed at [facility], [place] [address], [place], [place] [address]    Report Status 05/09/2019 FINAL  Final  Culture, blood (routine x 2)     Status: None   Collection Time: 05/04/19 10:48 AM   Specimen: BLOOD  Result Value Ref Range Status   Specimen Description   Final    BLOOD LEFT HAND Performed at [place] [address], [place], [place] [address]    Special Requests   Final    BOTTLES DRAWN AEROBIC ONLY Blood Culture adequate volume Performed at [place] [address], [place], [place] [address]    Culture   Final    NO GROWTH 5 DAYS Performed at [facility], [place] [address], [place],  [address]    Report Status 05/09/2019 FINAL  Final     Labs: BNP (last 3 results) No results for input(s): BNP in the last 8760 hours. Basic Metabolic Panel: Recent Labs  Lab 05/06/19 0601  NA 140  K 2.9*  CL 108  CO2 20*  GLUCOSE 129*  BUN 27*  CREATININE 3.10*  CALCIUM 8.4*   Liver Function Tests: No results for input(s): AST, ALT, ALKPHOS, BILITOT, PROT, ALBUMIN in the last 168 hours. No results for input(s): LIPASE, AMYLASE in the last 168 hours. No results for input(s): AMMONIA in the last 168 hours. CBC: No results for input(s): WBC, NEUTROABS, HGB, HCT, MCV, PLT in the last 168 hours. Cardiac Enzymes: No results for input(s): CKTOTAL, CKMB, CKMBINDEX, TROPONINI in the last 168 hours. BNP: Invalid input(s): POCBNP CBG: Recent Labs  Lab 05/05/19 1752 05/05/19 2221 05/06/19 0737 05/06/19 1149  GLUCAP 197* 148* 111* 166*   D-Dimer No results for input(s): DDIMER in the last 72 hours. Hgb A1c No results for input(s): HGBA1C in the last 72 hours. Lipid Profile No results for input(s): CHOL, HDL,  LDLCALC, TRIG, CHOLHDL, LDLDIRECT in the  last 72 hours. Thyroid function studies No results for input(s): TSH, T4TOTAL, T3FREE, THYROIDAB in the last 72 hours.  Invalid input(s): FREET3 Anemia work up No results for input(s): VITAMINB12, FOLATE, FERRITIN, TIBC, IRON, RETICCTPCT in the last 72 hours. Urinalysis    Component Value Date/Time   COLORURINE YELLOW 05/02/2019 1743   APPEARANCEUR CLEAR 05/02/2019 1743   LABSPEC 1.013 05/02/2019 1743   LABSPEC 1.015 04/26/2017 1452   PHURINE 5.0 05/02/2019 1743   GLUCOSEU NEGATIVE 05/02/2019 1743   GLUCOSEU Negative 04/26/2017 1452   HGBUR NEGATIVE 05/02/2019 1743   BILIRUBINUR NEGATIVE 05/02/2019 1743   BILIRUBINUR N 03/04/2018 1657   BILIRUBINUR Negative 04/26/2017 1452   KETONESUR NEGATIVE 05/02/2019 1743   PROTEINUR 30 (A) 05/02/2019 1743   UROBILINOGEN 0.2 03/04/2018 1657   UROBILINOGEN 0.2 04/26/2017 1452   NITRITE NEGATIVE 05/02/2019 1743   LEUKOCYTESUR NEGATIVE 05/02/2019 1743   LEUKOCYTESUR Negative 04/26/2017 1452   Sepsis Labs Invalid input(s): PROCALCITONIN,  WBC,  LACTICIDVEN Microbiology Recent Results (from the past 240 hour(s))  SARS CORONAVIRUS 2 (TAT 6-24 HRS) Nasopharyngeal Nasopharyngeal Swab     Status: None   Collection Time: 05/03/19  1:49 AM   Specimen: Nasopharyngeal Swab  Result Value Ref Range Status   SARS Coronavirus 2 NEGATIVE NEGATIVE Final    Comment: (NOTE) SARS-CoV-2 target nucleic acids are NOT DETECTED. The SARS-CoV-2 RNA is generally detectable in upper and lower respiratory specimens during the acute phase of infection. Negative results do not preclude SARS-CoV-2 infection, do not rule out co-infections with other pathogens, and should not be used as the sole basis for treatment or other patient management decisions. Negative results must be combined with clinical observations, patient history, and epidemiological information. The expected result is Negative. Fact Sheet for  Patients: SugarRoll.be Fact Sheet for Healthcare Providers: https://www.woods-.com/ This test is not yet approved or cleared by the [place] FDA and  has been authorized for detection and/or diagnosis of SARS-CoV-2 by FDA under an Emergency Use Authorization (EUA). This EUA will remain  in effect (meaning this test can be used) for the duration of the COVID-19 declaration under Section 56 4(b)(1) of the Act, 21 U.S.C. section 360bbb-3(b)(1), unless the authorization is terminated or revoked sooner. Performed at [facility], [place] [address] [place], [place] [address]   Culture, Urine     Status: Abnormal   Collection Time: 05/04/19  9:27 AM   Specimen: Urine, Clean Catch  Result Value Ref Range Status   Specimen Description   Final    URINE, CLEAN CATCH Performed at [facility], [place] [address] [place], [place] [address]    Special Requests   Final    NONE Performed at [facility], [place] [address] [place], [place] [address]    Culture (A)  Final    >=100,000 COLONIES/mL KLEBSIELLA PNEUMONIAE 20,000 COLONIES/mL ENTEROCOCCUS FAECALIS    Report Status 05/07/2019 FINAL  Final   Organism ID, Bacteria KLEBSIELLA PNEUMONIAE (A)  Final   Organism ID, Bacteria ENTEROCOCCUS FAECALIS (A)  Final      Susceptibility   Enterococcus faecalis - MIC*    AMPICILLIN <=2 SENSITIVE Sensitive     LEVOFLOXACIN 1 SENSITIVE Sensitive     NITROFURANTOIN <=16 SENSITIVE Sensitive     VANCOMYCIN 2 SENSITIVE Sensitive     * 20,000 COLONIES/mL ENTEROCOCCUS FAECALIS   Klebsiella pneumoniae - MIC*    AMPICILLIN >=32 RESISTANT Resistant     CEFAZOLIN <=4  SENSITIVE Sensitive     CEFTRIAXONE <=1 SENSITIVE Sensitive     CIPROFLOXACIN <=0.25 SENSITIVE Sensitive     GENTAMICIN <=1 SENSITIVE Sensitive     IMIPENEM <=0.25 SENSITIVE Sensitive     NITROFURANTOIN 64 INTERMEDIATE Intermediate     TRIMETH/SULFA <=20  SENSITIVE Sensitive     AMPICILLIN/SULBACTAM 4 SENSITIVE Sensitive     PIP/TAZO <=4 SENSITIVE Sensitive     Extended ESBL NEGATIVE Sensitive     * >=100,000 COLONIES/mL KLEBSIELLA PNEUMONIAE  Culture, blood (routine x 2)     Status: None   Collection Time: 05/04/19 10:44 AM   Specimen: BLOOD  Result Value Ref Range Status   Specimen Description   Final    BLOOD LEFT WRIST Performed at [facility] [address] [place], [place] [address]    Special Requests   Final    BOTTLES DRAWN AEROBIC AND ANAEROBIC Blood Culture adequate volume Performed at [place] [address], [place], [place] [address]    Culture   Final    NO GROWTH 5 DAYS Performed at [facility], [place] [address] [place], [place] [address]    Report Status 05/09/2019 FINAL  Final  Culture, blood (routine x 2)     Status: None   Collection Time: 05/04/19 10:48 AM   Specimen: BLOOD  Result Value Ref Range Status   Specimen Description   Final    BLOOD LEFT HAND Performed at [place] [address] [place], [place] [address]    Special Requests   Final    BOTTLES DRAWN AEROBIC ONLY Blood Culture adequate volume Performed at [place] [address] [place], [place] [address]    Culture   Final    NO GROWTH 5 DAYS Performed at [facility], [place] [address], [place],  [address]    Report Status 05/09/2019 FINAL  Final     Time coordinating discharge:  39 minutes  SIGNED:   Georgette Shell, MD  [facility] 05/12/2019, 2:53 PM Pager   If 7PM-7AM, please contact night-coverage [email or web address] Password [email or web address]

## 2019-05-12 NOTE — Patient Instructions (Addendum)
Health Maintenance Due  Topic Date Due  . [place], call to schedule 02/12/2019  . INFLUENZA VACCINE -today 03/22/2019   Thanks for doing labs  Considering insulin depending on sugars this week- please check each morning and then perhaps 2 more times per day varying between before lunch, before dinner, 2 hours after breakfast, 2 hours after lunch, 2 hours after dinner, and bedtime  Update me Friday or Monday to help with consideration  We may/likely will need more bloodwork in coming weeks depending on results.

## 2019-05-12 NOTE — Assessment & Plan Note (Signed)
Diabetes- glimepiride and metformin were stopped.  Patient was started on glipizide 2.5 mg.  He only checks his blood sugar today and was from 2 25-55- had not checked since being home from hospital-we opted to get some more home numbers and discussed potentially using insulin.  Would need to be very cautious given renal function.

## 2019-05-13 LAB — CBC
HCT: 26.1 % — ABNORMAL LOW (ref 39.0–52.0)
Hemoglobin: 8.7 g/dL — ABNORMAL LOW (ref 13.0–17.0)
MCHC: 33.4 g/dL (ref 30.0–36.0)
MCV: 80.5 fl (ref 78.0–100.0)
Platelets: 337 10*3/uL (ref 150.0–400.0)
RBC: 3.24 Mil/uL — ABNORMAL LOW (ref 4.22–5.81)
RDW: 15.6 % — ABNORMAL HIGH (ref 11.5–15.5)
WBC: 7.4 10*3/uL (ref 4.0–10.5)

## 2019-05-13 LAB — BASIC METABOLIC PANEL
BUN: 31 mg/dL — ABNORMAL HIGH (ref 6–23)
CO2: 26 mEq/L (ref 19–32)
Calcium: 9.7 mg/dL (ref 8.4–10.5)
Chloride: 98 mEq/L (ref 96–112)
Creatinine, Ser: 2.79 mg/dL — ABNORMAL HIGH (ref 0.40–1.50)
GFR: 26.96 mL/min — ABNORMAL LOW (ref >60.00)
Glucose, Bld: 197 mg/dL — ABNORMAL HIGH (ref 70–99)
Potassium: 3.7 mEq/L (ref 3.5–5.1)
Sodium: 134 mEq/L — ABNORMAL LOW (ref 135–145)

## 2019-05-13 LAB — LIPID PANEL
Cholesterol: 191 mg/dL (ref 0–200)
HDL: 24.8 mg/dL — ABNORMAL LOW (ref >39.00)
NonHDL: 166.56
Total CHOL/HDL Ratio: 8
Triglycerides: 249 mg/dL — ABNORMAL HIGH (ref 0.0–149.0)
VLDL: 49.8 mg/dL — ABNORMAL HIGH (ref 0.0–40.0)

## 2019-05-13 LAB — LDL CHOLESTEROL, DIRECT: Direct LDL: 122 mg/dL

## 2019-05-14 LAB — URINE CULTURE
MICRO NUMBER:: 903759
Result:: NO GROWTH
SPECIMEN QUALITY:: ADEQUATE

## 2019-05-20 ENCOUNTER — Other Ambulatory Visit: Payer: Self-pay | Admitting: Medical Oncology

## 2019-05-20 ENCOUNTER — Inpatient Hospital Stay (HOSPITAL_BASED_OUTPATIENT_CLINIC_OR_DEPARTMENT_OTHER): Payer: Medicare Other | Admitting: Internal Medicine

## 2019-05-20 ENCOUNTER — Inpatient Hospital Stay: Payer: Medicare Other

## 2019-05-20 ENCOUNTER — Encounter: Payer: Self-pay | Admitting: Internal Medicine

## 2019-05-20 ENCOUNTER — Other Ambulatory Visit: Payer: Self-pay

## 2019-05-20 VITALS — BP 138/80 | HR 94 | Temp 98.2°F | Resp 18 | Ht 75.0 in | Wt 161.2 lb

## 2019-05-20 DIAGNOSIS — C3490 Malignant neoplasm of unspecified part of unspecified bronchus or lung: Secondary | ICD-10-CM

## 2019-05-20 DIAGNOSIS — C7A090 Malignant carcinoid tumor of the bronchus and lung: Secondary | ICD-10-CM | POA: Diagnosis present

## 2019-05-20 DIAGNOSIS — I10 Essential (primary) hypertension: Secondary | ICD-10-CM

## 2019-05-20 DIAGNOSIS — N179 Acute kidney failure, unspecified: Secondary | ICD-10-CM | POA: Diagnosis not present

## 2019-05-20 DIAGNOSIS — E1159 Type 2 diabetes mellitus with other circulatory complications: Secondary | ICD-10-CM | POA: Diagnosis not present

## 2019-05-20 DIAGNOSIS — Z5111 Encounter for antineoplastic chemotherapy: Secondary | ICD-10-CM

## 2019-05-20 DIAGNOSIS — C7A8 Other malignant neuroendocrine tumors: Secondary | ICD-10-CM

## 2019-05-20 DIAGNOSIS — D649 Anemia, unspecified: Secondary | ICD-10-CM | POA: Diagnosis not present

## 2019-05-20 DIAGNOSIS — J439 Emphysema, unspecified: Secondary | ICD-10-CM | POA: Diagnosis not present

## 2019-05-20 DIAGNOSIS — C7B8 Other secondary neuroendocrine tumors: Secondary | ICD-10-CM | POA: Diagnosis not present

## 2019-05-20 DIAGNOSIS — Z7984 Long term (current) use of oral hypoglycemic drugs: Secondary | ICD-10-CM | POA: Diagnosis not present

## 2019-05-20 DIAGNOSIS — I152 Hypertension secondary to endocrine disorders: Secondary | ICD-10-CM

## 2019-05-20 DIAGNOSIS — N189 Chronic kidney disease, unspecified: Secondary | ICD-10-CM | POA: Diagnosis not present

## 2019-05-20 DIAGNOSIS — Z79899 Other long term (current) drug therapy: Secondary | ICD-10-CM | POA: Diagnosis not present

## 2019-05-20 DIAGNOSIS — E119 Type 2 diabetes mellitus without complications: Secondary | ICD-10-CM | POA: Diagnosis not present

## 2019-05-20 DIAGNOSIS — E785 Hyperlipidemia, unspecified: Secondary | ICD-10-CM | POA: Diagnosis not present

## 2019-05-20 DIAGNOSIS — Z791 Long term (current) use of non-steroidal anti-inflammatories (NSAID): Secondary | ICD-10-CM | POA: Diagnosis not present

## 2019-05-20 LAB — CBC WITH DIFFERENTIAL (CANCER CENTER ONLY)
Abs Immature Granulocytes: 0.02 10*3/uL (ref 0.00–0.07)
Basophils Absolute: 0 10*3/uL (ref 0.0–0.1)
Basophils Relative: 0 %
Eosinophils Absolute: 0 10*3/uL (ref 0.0–0.5)
Eosinophils Relative: 0 %
HCT: 27.3 % — ABNORMAL LOW (ref 39.0–52.0)
Hemoglobin: 8.7 g/dL — ABNORMAL LOW (ref 13.0–17.0)
Immature Granulocytes: 0 %
Lymphocytes Relative: 20 %
Lymphs Abs: 1.9 10*3/uL (ref 0.7–4.0)
MCH: 26.9 pg (ref 26.0–34.0)
MCHC: 31.9 g/dL (ref 30.0–36.0)
MCV: 84.3 fL (ref 80.0–100.0)
Monocytes Absolute: 0.5 10*3/uL (ref 0.1–1.0)
Monocytes Relative: 5 %
Neutro Abs: 7 10*3/uL (ref 1.7–7.7)
Neutrophils Relative %: 75 %
Platelet Count: 391 10*3/uL (ref 150–400)
RBC: 3.24 MIL/uL — ABNORMAL LOW (ref 4.22–5.81)
RDW: 15.8 % — ABNORMAL HIGH (ref 11.5–15.5)
WBC Count: 9.5 10*3/uL (ref 4.0–10.5)
nRBC: 0 % (ref 0.0–0.2)

## 2019-05-20 LAB — CMP (CANCER CENTER ONLY)
ALT: 19 U/L (ref 0–44)
AST: 19 U/L (ref 15–41)
Albumin: 3.3 g/dL — ABNORMAL LOW (ref 3.5–5.0)
Alkaline Phosphatase: 60 U/L (ref 38–126)
Anion gap: 11 (ref 5–15)
BUN: 30 mg/dL — ABNORMAL HIGH (ref 8–23)
CO2: 25 mmol/L (ref 22–32)
Calcium: 9.6 mg/dL (ref 8.9–10.3)
Chloride: 103 mmol/L (ref 98–111)
Creatinine: 2.68 mg/dL — ABNORMAL HIGH (ref 0.61–1.24)
GFR, Est AFR Am: 26 mL/min — ABNORMAL LOW (ref >60)
GFR, Estimated: 22 mL/min — ABNORMAL LOW (ref >60)
Glucose, Bld: 134 mg/dL — ABNORMAL HIGH (ref 70–99)
Potassium: 4 mmol/L (ref 3.5–5.1)
Sodium: 139 mmol/L (ref 135–145)
Total Bilirubin: 0.3 mg/dL (ref 0.3–1.2)
Total Protein: 8.2 g/dL — ABNORMAL HIGH (ref 6.5–8.1)
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
## 2019-05-21 ENCOUNTER — Telehealth: Payer: Self-pay | Admitting: Internal Medicine

## 2019-05-21 NOTE — Telephone Encounter (Signed)
Scheduled appt per 9/29 los - mailed letter with appt date and time

## 2019-05-22 ENCOUNTER — Telehealth: Payer: Self-pay | Admitting: Family Medicine

## 2019-05-22 NOTE — Telephone Encounter (Signed)
Received note from patient's ex-wife.  Blood sugars are improving despite diet not improving-she thinks being off the Afinitor may have helped.  Also wonders if UTI being gone is helping.  Blood sugars range from 100-2 33.  Of the last nearly 30 readings only 2 numbers over 200, 3 over 190, 5 over 180.  UTI symptoms have resolved.  Sleeping more.  Denies pain.  Appetite improving.  Still feels somewhat weak and tired.  Blood pressure is reportedly normal.  Temperature has been normal.  Oxygen levels actually improved into the mid 90s when he moves around.  Team-please call and tell patient and ex-wife thank you for the update or can send my chart message telling her thank you  Patient also asked for nephrologist recommendation- it typically takes a very long time to get into nephrology and they have a good group of physicians so I do not have a strong preference-I think more importantly is allowing the option for him to see multiple physicians so he will be able to get in sooner

## 2019-05-23 NOTE — Telephone Encounter (Signed)
Called pt and informed of below information and pt verbalized undertanding.

## 2019-05-30 ENCOUNTER — Other Ambulatory Visit: Payer: Self-pay | Admitting: Family Medicine

## 2019-06-02 ENCOUNTER — Telehealth: Payer: Self-pay | Admitting: Medical Oncology

## 2019-06-02 NOTE — Telephone Encounter (Signed)
Has not received appt for [facility]. Per [name] appt may not be for 6 weeks unless provider calls as urgent.

## 2019-06-02 NOTE — Telephone Encounter (Signed)
He was seen by them in the hospital.  He should call the office and get an appointment with your doctor who saw him in the hospital.  Thank you.

## 2019-06-03 NOTE — Telephone Encounter (Signed)
Staff message sent to Dr. Ansel Bong office regarding Nephrology referral. See his office's telephone note from 05/22/19

## 2019-06-04 ENCOUNTER — Other Ambulatory Visit: Payer: Self-pay

## 2019-06-04 DIAGNOSIS — N184 Chronic kidney disease, stage 4 (severe): Secondary | ICD-10-CM

## 2019-06-06 ENCOUNTER — Ambulatory Visit: Payer: Medicare Other | Admitting: Family Medicine

## 2019-06-23 ENCOUNTER — Ambulatory Visit (INDEPENDENT_AMBULATORY_CARE_PROVIDER_SITE_OTHER): Payer: Medicare Other | Admitting: Family Medicine

## 2019-06-23 ENCOUNTER — Encounter: Payer: Self-pay | Admitting: Family Medicine

## 2019-06-23 ENCOUNTER — Other Ambulatory Visit: Payer: Self-pay

## 2019-06-23 VITALS — BP 132/78 | HR 54 | Temp 98.0°F | Ht 75.0 in | Wt 164.2 lb

## 2019-06-23 DIAGNOSIS — I152 Hypertension secondary to endocrine disorders: Secondary | ICD-10-CM

## 2019-06-23 DIAGNOSIS — E1159 Type 2 diabetes mellitus with other circulatory complications: Secondary | ICD-10-CM | POA: Diagnosis not present

## 2019-06-23 DIAGNOSIS — N1832 Chronic kidney disease, stage 3b: Secondary | ICD-10-CM | POA: Diagnosis not present

## 2019-06-23 DIAGNOSIS — I10 Essential (primary) hypertension: Secondary | ICD-10-CM

## 2019-06-23 DIAGNOSIS — N183 Chronic kidney disease, stage 3 unspecified: Secondary | ICD-10-CM | POA: Diagnosis not present

## 2019-06-23 DIAGNOSIS — E1122 Type 2 diabetes mellitus with diabetic chronic kidney disease: Secondary | ICD-10-CM

## 2019-06-23 DIAGNOSIS — M1A09X0 Idiopathic chronic gout, multiple sites, without tophus (tophi): Secondary | ICD-10-CM | POA: Diagnosis not present

## 2019-06-23 LAB — POCT GLYCOSYLATED HEMOGLOBIN (HGB A1C): Hemoglobin A1C: 5.4 % (ref 4.0–5.6)

## 2019-06-23 NOTE — Progress Notes (Signed)
Phone [phone]  Subjective:  In person visit Richard Navarro is a 73 y.o. year old very pleasant male patient who presents for/with See problem oriented charting Chief Complaint  Patient presents with  . Follow-up- DM     ROS- no edema recently. Has had low blood sugars.  Shortness ofbreath stable. No fever/chills   Past Medical History-  Patient Active Problem List   Diagnosis Date Noted  . Anemia 05/07/2018    Priority: High  . Neuroendocrine carcinoma ([facility]) 08/09/2017    Priority: High  . Primary cancer of right lung ([place]) 12/22/2016    Priority: High  . Diabetes mellitus with renal manifestation ([facility])     Priority: High  . CKD (chronic kidney disease), stage III 03/04/2018    Priority: Medium  . Myalgia due to statin 03/04/2018    Priority: Medium  . COPD (chronic obstructive pulmonary disease) ([facility]) 12/01/2016    Priority: Medium  . PTSD (post-traumatic stress disorder) 12/07/2015    Priority: Medium  . Erectile dysfunction 04/09/2014    Priority: Medium  . Former smoker 03/26/2014    Priority: Medium  . Hypertension associated with diabetes ([place])     Priority: Medium  . Gout     Priority: Medium  . Depression     Priority: Medium  . Hyperlipidemia     Priority: Medium  . Thrombocytopenia ([place])     Priority: Low  . Pneumonia 08/09/2017    Priority: Low  . Encounter for antineoplastic chemotherapy 01/10/2017    Priority: Low  . Goals of care, counseling/discussion 01/10/2017    Priority: Low  . Prostate cancer screening 04/17/2014    Priority: Low  . Arthritis 03/26/2014    Priority: Low  . History of adenomatous polyp of colon 03/26/2014    Priority: Low  . GERD (gastroesophageal reflux disease)     Priority: Low    Medications- reviewed and updated Current Outpatient Medications  Medication Sig Dispense Refill  . albuterol (VENTOLIN HFA) 108 (90 Base) MCG/ACT inhaler Inhale 2 puffs into the lungs every 4 (four) hours as needed for wheezing  or shortness of breath. 3 Inhaler 2  . allopurinol (ZYLOPRIM) 100 MG tablet Take 1 tablet (100 mg total) by mouth daily. 90 tablet 3  . amLODipine (NORVASC) 5 MG tablet TAKE 1 TABLET DAILY 90 tablet 3  . Calcium Carbonate Antacid (CALCIUM CARBONATE, DOSED IN MG ELEMENTAL CALCIUM,) 1250 MG/5ML SUSP Take 5 mLs (500 mg of elemental calcium total) by mouth every 6 (six) hours as needed for indigestion. 450 mL   . Continuous Blood Gluc Sensor (FREESTYLE LIBRE 14 DAY SENSOR) MISC Apply 1 each topically See admin instructions. 2 each 2  . diclofenac sodium (VOLTAREN) 1 % GEL APPLY 2 GRAMS TOPICALLY FOUR TIMES A DAY AS NEEDED FOR LEFT SHOULDER ARTHRITIS (Patient taking differently: Apply 2 g topically 4 (four) times daily as needed. FOR LEFT SHOULDER ARTHRITIS) 300 g 3  . Ferrous Sulfate (IRON) 325 (65 Fe) MG TABS Take 1 tablet by mouth every other day.     Marland Kitchen glucose blood test strip Use to test blood sugar twice a day 200 each 3  . guaiFENesin (MUCINEX) 600 MG 12 hr tablet Take 600 mg by mouth 2 (two) times daily.     . metoprolol tartrate (LOPRESSOR) 25 MG tablet Take 0.5 tablets (12.5 mg total) by mouth 2 (two) times daily. 60 tablet 2  . pantoprazole (PROTONIX) 40 MG tablet Take 1 tablet (40 mg total) by mouth daily.  90 tablet 3  . Tiotropium Bromide Monohydrate (SPIRIVA RESPIMAT) 2.5 MCG/ACT AERS Inhale 2 puffs into the lungs daily. 12 g 1  . vitamin B-12 (CYANOCOBALAMIN) 1000 MCG tablet Take 1,000 mcg by mouth daily.     . vitamin C (ASCORBIC ACID) 500 MG tablet Take 500 mg by mouth daily.     No current facility-administered medications for this visit.      Objective:  BP 132/78   Pulse (!) 54   Temp 98 F (36.7 C)   Ht 6\' 3"  (1.905 m)   Wt 164 lb 3.2 oz (74.5 kg)   SpO2 97%   BMI 20.52 kg/m  Gen: NAD, resting comfortably CV: RRR no murmurs rubs or gallops Lungs: CTAB no crackles, wheeze, rhonchi Abdomen: soft/nontender/nondistended No edema today Skin: warm, dry     Assessment  and Plan   #GFR under 30 (listing as CKD III for now but may very well be CKD IV) and lung cancer patient possibly related to chemotherapy S: Patient's GFR was in the teens but appears to be improving slightly in the 20s off Afinitor since early September-concerned that Afinitor or prior contrast studies may have contributed to worsening renal function A/P: We spent time discussing if his kidney function remains in this range will be chronic kidney disease stage IV-I am hopeful as he remains off Afinitor that we will see further improvement-at present appears will be off this at least until December -Appears had hypocalcemia while hospitalized and was placed on Tums-he asks about continuing this and I told him that is reasonable until he sees Dr. Moshe Cipro  #Pulmonary follow-up-sees Dr. Lamonte Sakai December 1. #Oncology follow-up-repeat lab and CT scan of the chest December 28 for an appointment on December 30 with Dr. Earlie Server.  Patient knows she did not have contrast for this exam.   #Diabetes mellitus S: Compliant with glipizide 2.5 mg.  Unfortunately has had some blood sugars into the 60s and may have had some associated weakness with this Prior to reduce GFR-patient had been on  metformin 5 mg twice a day and Amaryl 2 mg daily.    CBG-uses freestyle libre.  90-day average under 135 and 30-day average under 110 Lab Results  Component Value Date   HGBA1C 5.4 06/23/2019   HGBA1C 8.5 (A) 02/18/2019   HGBA1C 6.6 (A) 09/24/2018  A/P: Overcontrolled-stop glipizide 2.5 mg daily.  Not on any other diabetes medications at this time.  Repeat A1c 3 months and 1 day  #Hypertension associated with diabetes S: Compliant with amlodipine 5 mg and metoprolol 12.5 mg twice daily.  Edema is improved at present A/P:  Stable. Continue current medications.     #Gout S: Compliant with allopurinol 100 mg.-Did not report any recent gout flares A/P: Reasonable control-initial dosing for allopurinol would  traditionally be 50 mg every other day at this GFR but since he has tolerated this dose I do not feel strongly about reducing dose unless nephrology suggests  #Constipation-reasonable control on Dulcolax, Colace, mineral oil-I am okay with this regimen as it is ineffective  #Anemia-has had some weakness and seems to sleep a lot-we discussed how CKD stage IV could contribute to this anemia-to have upcoming follow-up with nephrology  Recommended follow up: 3 months and 1 day follow-up suggested Future Appointments  Date Time Provider [place]  07/15/2019  9:00 AM Marzetta Board, DPM [facility]  07/22/2019  2:00 PM Collene Gobble, MD [facility] None  08/18/2019  9:00 AM [facility] 3 [facility]  None  08/18/2019 10:00 AM [facility]  08/20/2019 10:15 AM Curt Bears, MD [facility] None  09/30/2019  3:00 PM Yong Channel Brayton Mars, MD [facility]   Lab/Order associations:   ICD-10-CM   1. Type 2 diabetes mellitus with stage 3 chronic kidney disease, without long-term current use of insulin, unspecified whether stage 3a or 3b CKD ([facility])  E11.22 POCT HgB A1C   N18.30   2. Hypertension associated with diabetes ([place])  E11.59    I10   3. Idiopathic chronic gout of multiple sites without tophus  M1A.09X0   4. Stage 3b chronic kidney disease  N18.32     No orders of the defined types were placed in this encounter.   Return precautions advised.  [name], MD

## 2019-06-23 NOTE — Patient Instructions (Addendum)
Health Maintenance Due  Topic Date Due  . OPHTHALMOLOGY EXAM -will schedule 02/12/2019  . COLONOSCOPY-hold off on scheduling given current ongoing cancer treatment 05/25/2019   Stop glipizide 2.5 mg  a1c was 5.4 today- we want that closer to 7 to avoid any lows.   I will be on outlook for Dr. Moshe Cipro records  3 months and 1 day follow up  Or sooner if you need me

## 2019-07-04 DIAGNOSIS — D631 Anemia in chronic kidney disease: Secondary | ICD-10-CM | POA: Diagnosis not present

## 2019-07-04 DIAGNOSIS — C7A8 Other malignant neuroendocrine tumors: Secondary | ICD-10-CM | POA: Diagnosis not present

## 2019-07-04 DIAGNOSIS — N184 Chronic kidney disease, stage 4 (severe): Secondary | ICD-10-CM | POA: Diagnosis not present

## 2019-07-04 DIAGNOSIS — I129 Hypertensive chronic kidney disease with stage 1 through stage 4 chronic kidney disease, or unspecified chronic kidney disease: Secondary | ICD-10-CM | POA: Diagnosis not present

## 2019-07-04 LAB — BASIC METABOLIC PANEL
BUN: 33 — AB (ref 4–21)
CO2: 28 — AB (ref 13–22)
Chloride: 101 (ref 99–108)
Creatinine: 1.7 — AB (ref 0.6–1.3)
Glucose: 122
Potassium: 4.2 (ref 3.4–5.3)
Sodium: 137 (ref 137–147)

## 2019-07-04 LAB — IRON,TIBC AND FERRITIN PANEL
Ferritin: 487
Iron: 77

## 2019-07-04 LAB — CBC AND DIFFERENTIAL: Hemoglobin: 10.6 — AB (ref 13.5–17.5)

## 2019-07-04 LAB — COMPREHENSIVE METABOLIC PANEL
Albumin: 4 (ref 3.5–5.0)
Calcium: 9.8 (ref 8.7–10.7)

## 2019-07-15 ENCOUNTER — Ambulatory Visit (INDEPENDENT_AMBULATORY_CARE_PROVIDER_SITE_OTHER): Payer: Medicare Other | Admitting: Podiatry

## 2019-07-15 ENCOUNTER — Encounter: Payer: Self-pay | Admitting: Podiatry

## 2019-07-15 ENCOUNTER — Other Ambulatory Visit: Payer: Self-pay

## 2019-07-15 DIAGNOSIS — B351 Tinea unguium: Secondary | ICD-10-CM | POA: Diagnosis not present

## 2019-07-15 DIAGNOSIS — M79675 Pain in left toe(s): Secondary | ICD-10-CM

## 2019-07-15 DIAGNOSIS — E1151 Type 2 diabetes mellitus with diabetic peripheral angiopathy without gangrene: Secondary | ICD-10-CM

## 2019-07-15 DIAGNOSIS — M79674 Pain in right toe(s): Secondary | ICD-10-CM | POA: Diagnosis not present

## 2019-07-15 NOTE — Patient Instructions (Signed)
Diabetes Mellitus and Foot Care Foot care is an important part of your health, especially when you have diabetes. Diabetes may cause you to have problems because of poor blood flow (circulation) to your feet and legs, which can cause your skin to:  Become thinner and drier.  Break more easily.  Heal more slowly.  Peel and crack. You may also have nerve damage (neuropathy) in your legs and feet, causing decreased feeling in them. This means that you may not notice minor injuries to your feet that could lead to more serious problems. Noticing and addressing any potential problems early is the best way to prevent future foot problems. How to care for your feet Foot hygiene  Wash your feet daily with warm water and mild soap. Do not use hot water. Then, pat your feet and the areas between your toes until they are completely dry. Do not soak your feet as this can dry your skin.  Trim your toenails straight across. Do not dig under them or around the cuticle. File the edges of your nails with an emery board or nail file.  Apply a moisturizing lotion or petroleum jelly to the skin on your feet and to dry, brittle toenails. Use lotion that does not contain alcohol and is unscented. Do not apply lotion between your toes. Shoes and socks  Wear clean socks or stockings every day. Make sure they are not too tight. Do not wear knee-high stockings since they may decrease blood flow to your legs.  Wear shoes that fit properly and have enough cushioning. Always look in your shoes before you put them on to be sure there are no objects inside.  To break in new shoes, wear them for just a few hours a day. This prevents injuries on your feet. Wounds, scrapes, corns, and calluses  Check your feet daily for blisters, cuts, bruises, sores, and redness. If you cannot see the bottom of your feet, use a mirror or ask someone for help.  Do not cut corns or calluses or try to remove them with medicine.  If you  find a minor scrape, cut, or break in the skin on your feet, keep it and the skin around it clean and dry. You may clean these areas with mild soap and water. Do not clean the area with peroxide, alcohol, or iodine.  If you have a wound, scrape, corn, or callus on your foot, look at it several times a day to make sure it is healing and not infected. Check for: ? Redness, swelling, or pain. ? Fluid or blood. ? Warmth. ? Pus or a bad smell. General instructions  Do not cross your legs. This may decrease blood flow to your feet.  Do not use heating pads or hot water bottles on your feet. They may burn your skin. If you have lost feeling in your feet or legs, you may not know this is happening until it is too late.  Protect your feet from hot and cold by wearing shoes, such as at the beach or on hot pavement.  Schedule a complete foot exam at least once a year (annually) or more often if you have foot problems. If you have foot problems, report any cuts, sores, or bruises to your health care provider immediately. Contact a health care provider if:  You have a medical condition that increases your risk of infection and you have any cuts, sores, or bruises on your feet.  You have an injury that is not   healing.  You have redness on your legs or feet.  You feel burning or tingling in your legs or feet.  You have pain or cramps in your legs and feet.  Your legs or feet are numb.  Your feet always feel cold.  You have pain around a toenail. Get help right away if:  You have a wound, scrape, corn, or callus on your foot and: ? You have pain, swelling, or redness that gets worse. ? You have fluid or blood coming from the wound, scrape, corn, or callus. ? Your wound, scrape, corn, or callus feels warm to the touch. ? You have pus or a bad smell coming from the wound, scrape, corn, or callus. ? You have a fever. ? You have a red line going up your leg. Summary  Check your feet every day  for cuts, sores, red spots, swelling, and blisters.  Moisturize feet and legs daily.  Wear shoes that fit properly and have enough cushioning.  If you have foot problems, report any cuts, sores, or bruises to your health care provider immediately.  Schedule a complete foot exam at least once a year (annually) or more often if you have foot problems. This information is not intended to replace advice given to you by your health care provider. Make sure you discuss any questions you have with your health care provider. Document Released: 08/04/2000 Document Revised: 09/19/2017 Document Reviewed: 09/08/2016 Elsevier Patient Education  2020 Else[employer] Inc[employer]  

## 2019-07-19 NOTE — Progress Notes (Signed)
Subjective:  Richard Navarro presents to clinic today with cc of  painful, thick, discolored, elongated toenails  of both feet that become tender and patient cannot cut because of thickness. Pain is aggravated when wearing enclosed shoe gear and relieved with periodic professional debridement.  Patient voices no new pedal concerns on today's visit.  Medications reviewed in chart.  Allergies  Allergen Reactions  . Lisinopril Swelling    Angioedema- on this and afinitor same time  . Simvastatin Other (See Comments)    Joint ache    Objective:  Physical Examination:  Vascular Examination: Capillary refill time <3 seconds b/l feet.   Faintly palpable DP pulses b/l.   PT pulses absent b/l.  Digital hair present b/l.   No edema noted b/l.  Skin temperature gradient WNL b/l.  Dermatological Examination: Skin thin, shiny and atrophic b/l.  No open wounds b/l.  No interdigital macerations noted b/l.  Elongated, thick, discolored brittle toenails with subungual debris and pain on dorsal palpation of nailbeds 1-5 b/l.  Musculoskeletal Examination: Muscle strength 5/5 to all muscle groups b/l.  No pain, crepitus or joint discomfort with active/passive ROM.  Neurological Examination: Sensation intact 5/5 b/l with 10 gram monofilament.  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 Toenails 1-5 b/l were debrided in length and girth without iatrogenic laceration. 2.  Continue soft, supportive shoe gear daily. 3.  Report any pedal injuries to medical professional. 4.  Follow up 3 months. 5.  Patient/POA to call should there be a question/concern in there interim.

## 2019-07-22 ENCOUNTER — Encounter: Payer: Self-pay | Admitting: Emergency Medicine

## 2019-07-22 ENCOUNTER — Ambulatory Visit (INDEPENDENT_AMBULATORY_CARE_PROVIDER_SITE_OTHER): Payer: Medicare Other | Admitting: Emergency Medicine

## 2019-07-22 ENCOUNTER — Other Ambulatory Visit: Payer: Self-pay

## 2019-07-22 DIAGNOSIS — C7A8 Other malignant neuroendocrine tumors: Secondary | ICD-10-CM | POA: Diagnosis not present

## 2019-07-22 DIAGNOSIS — J449 Chronic obstructive pulmonary disease, unspecified: Secondary | ICD-10-CM | POA: Diagnosis not present

## 2019-07-22 NOTE — Patient Instructions (Signed)
Please continue Spiriva Respimat 2 sprays once daily. Keep albuterol available to use 2 puffs if needed for shortness of breath Please follow with Dr. [name] and get your CT scan of the chest as planned. Follow with Dr. Lamonte Sakai in 12 months or sooner if you have any problems.

## 2019-07-22 NOTE — Assessment & Plan Note (Signed)
Stable on Spiriva, has benefited from it. Plan to continue as ordered.   Please continue Spiriva Respimat 2 sprays once daily. Keep albuterol available to use 2 puffs if needed for shortness of breath Follow with Dr. Lamonte Sakai in 12 months or sooner if you have any problems.

## 2019-07-22 NOTE — Assessment & Plan Note (Signed)
With enlargement of RLL mass while on Afinitor. Has had to be off of the medication since September due to renal insufficiency (improved some). Planning for repeat scan this month and follow up with Dr [name] to assess for interval change. ? Whether he will transition to an alternative therapy.

## 2019-07-22 NOTE — Progress Notes (Signed)
   Subjective:    Patient ID: Richard Navarro, male    DOB: [birth year], 73 y.o.   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  He also has COPD and we have followed him for this.  His most recent CT chest was done 10/08/2018 and I reviewed, shows some slight decrease in size in his central right lower lobe mass, stable mild mediastinal lymphadenopathy, some increased right basilar collapse/groundglass.  His neck CT is scheduled for 5/29.  Last time we tried starting him on Spiriva Respimat to see if he would get benefit.  We also started guaifenesin because he was having significant sputum production and cough.  Today he reports that he has waxing waning sx, dyspnea. Has to rest after heavy exertion. He feels that the Spiriva has helped him, less dyspnea. Less mucous production - remains on mucinex.   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  His most recent chest imaging 05/02/2019 reviewed by me, show mild increase in size of a right lower lobe mass, stable subcarinal lymphadenopathy, stable liver findings, sclerotic T7 lesion. Since last time (and that scan) he developed acute renal failure, and the Afinitor had to be stopped.  His next CT scan is planned for 12/28.  He has been managed on Spiriva Respimat, feels that his breathing has benefited. He can still get some dyspnea with conversation. He rests after walking. Less cough than before, less phlegm. On mucinex. Has seen some blood from nose and in phlegm. No CP. No albuterol use. PNA shot and flu shot up to date.     Review of Systems As per HPI     Objective:   Physical Exam   Vitals:   07/22/19 1408  BP: 130/80  Pulse: (!) 58  Temp: (!) 97.3 F (36.3 C)  TempSrc: Oral  SpO2: 97%  Weight: 169 lb (76.7 kg)  Height: 6'  3" (1.905 m)   Gen: Pleasant, thin, in no distress,  normal affect  ENT: Small ulceration left posterior pharynx. Mouth clear,  oropharynx clear, no postnasal drip  Neck: No JVD, no stridor  Lungs: No use of accessory muscles, no crackles or wheezes.   Cardiovascular: RRR, heart sounds normal, no murmur or gallops, no peripheral edema  Musculoskeletal: No deformities, no cyanosis or clubbing  Neuro: alert, non focal  Skin: Warm, no lesions or rash     Assessment & Plan:  COPD (chronic obstructive pulmonary disease) (HCC) Stable on Spiriva, has benefited from it. Plan to continue as ordered.   Please continue Spiriva Respimat 2 sprays once daily. Keep albuterol available to use 2 puffs if needed for shortness of breath Follow with Dr. Lamonte Sakai in 12 months or sooner if you have any problems.   Neuroendocrine carcinoma ([place]) With enlargement of RLL mass while on Afinitor. Has had to be off of the medication since September due to renal insufficiency (improved some). Planning for repeat scan this month and follow up with Dr [name] to assess for interval change. ? Whether he will transition to an alternative therapy.   [name], MD, PhD 07/22/2019, 4:14 PM [facility] [phone] or if no answer [phone]

## 2019-07-24 ENCOUNTER — Other Ambulatory Visit: Payer: Self-pay | Admitting: Family Medicine

## 2019-08-06 ENCOUNTER — Other Ambulatory Visit: Payer: Self-pay | Admitting: Emergency Medicine

## 2019-08-18 ENCOUNTER — Ambulatory Visit (HOSPITAL_COMMUNITY)
Admission: RE | Admit: 2019-08-18 | Discharge: 2019-08-18 | Disposition: A | Discharge status: Home / Self Care | Payer: Medicare Other | Source: Ambulatory Visit | Attending: Internal Medicine | Admitting: Internal Medicine

## 2019-08-18 ENCOUNTER — Inpatient Hospital Stay: Payer: Medicare Other | Attending: Internal Medicine

## 2019-08-18 ENCOUNTER — Other Ambulatory Visit: Payer: Self-pay

## 2019-08-18 ENCOUNTER — Encounter: Payer: Self-pay | Admitting: Family Medicine

## 2019-08-18 DIAGNOSIS — J439 Emphysema, unspecified: Secondary | ICD-10-CM | POA: Diagnosis not present

## 2019-08-18 DIAGNOSIS — K5900 Constipation, unspecified: Secondary | ICD-10-CM | POA: Diagnosis not present

## 2019-08-18 DIAGNOSIS — C7B02 Secondary carcinoid tumors of liver: Secondary | ICD-10-CM | POA: Diagnosis not present

## 2019-08-18 DIAGNOSIS — Z79899 Other long term (current) drug therapy: Secondary | ICD-10-CM | POA: Insufficient documentation

## 2019-08-18 DIAGNOSIS — C7A090 Malignant carcinoid tumor of the bronchus and lung: Secondary | ICD-10-CM | POA: Diagnosis not present

## 2019-08-18 DIAGNOSIS — I10 Essential (primary) hypertension: Secondary | ICD-10-CM | POA: Diagnosis not present

## 2019-08-18 DIAGNOSIS — E119 Type 2 diabetes mellitus without complications: Secondary | ICD-10-CM | POA: Diagnosis not present

## 2019-08-18 DIAGNOSIS — Z791 Long term (current) use of non-steroidal anti-inflammatories (NSAID): Secondary | ICD-10-CM | POA: Insufficient documentation

## 2019-08-18 DIAGNOSIS — N189 Chronic kidney disease, unspecified: Secondary | ICD-10-CM | POA: Diagnosis not present

## 2019-08-18 DIAGNOSIS — C7A8 Other malignant neuroendocrine tumors: Secondary | ICD-10-CM

## 2019-08-18 LAB — CMP (CANCER CENTER ONLY)
ALT: 15 U/L (ref 0–44)
AST: 14 U/L — ABNORMAL LOW (ref 15–41)
Albumin: 3.9 g/dL (ref 3.5–5.0)
Alkaline Phosphatase: 61 U/L (ref 38–126)
Anion gap: 9 (ref 5–15)
BUN: 31 mg/dL — ABNORMAL HIGH (ref 8–23)
CO2: 27 mmol/L (ref 22–32)
Calcium: 8.9 mg/dL (ref 8.9–10.3)
Chloride: 106 mmol/L (ref 98–111)
Creatinine: 1.85 mg/dL — ABNORMAL HIGH (ref 0.61–1.24)
GFR, Est AFR Am: 40 mL/min — ABNORMAL LOW (ref >60)
GFR, Estimated: 35 mL/min — ABNORMAL LOW (ref >60)
Glucose, Bld: 125 mg/dL — ABNORMAL HIGH (ref 70–99)
Potassium: 3.6 mmol/L (ref 3.5–5.1)
Sodium: 142 mmol/L (ref 135–145)
Total Bilirubin: 0.2 mg/dL — ABNORMAL LOW (ref 0.3–1.2)
Total Protein: 7.7 g/dL (ref 6.5–8.1)

## 2019-08-18 LAB — CBC WITH DIFFERENTIAL (CANCER CENTER ONLY)
Abs Immature Granulocytes: 0.01 10*3/uL (ref 0.00–0.07)
Basophils Absolute: 0 10*3/uL (ref 0.0–0.1)
Basophils Relative: 1 %
Eosinophils Absolute: 0.1 10*3/uL (ref 0.0–0.5)
Eosinophils Relative: 1 %
HCT: 36.9 % — ABNORMAL LOW (ref 39.0–52.0)
Hemoglobin: 12.3 g/dL — ABNORMAL LOW (ref 13.0–17.0)
Immature Granulocytes: 0 %
Lymphocytes Relative: 41 %
Lymphs Abs: 2.2 10*3/uL (ref 0.7–4.0)
MCH: 29.9 pg (ref 26.0–34.0)
MCHC: 33.3 g/dL (ref 30.0–36.0)
MCV: 89.6 fL (ref 80.0–100.0)
Monocytes Absolute: 0.4 10*3/uL (ref 0.1–1.0)
Monocytes Relative: 7 %
Neutro Abs: 2.6 10*3/uL (ref 1.7–7.7)
Neutrophils Relative %: 50 %
Platelet Count: 185 10*3/uL (ref 150–400)
RBC: 4.12 MIL/uL — ABNORMAL LOW (ref 4.22–5.81)
RDW: 14 % (ref 11.5–15.5)
WBC Count: 5.2 10*3/uL (ref 4.0–10.5)
nRBC: 0 % (ref 0.0–0.2)

## 2019-08-20 ENCOUNTER — Inpatient Hospital Stay (HOSPITAL_BASED_OUTPATIENT_CLINIC_OR_DEPARTMENT_OTHER): Payer: Medicare Other | Admitting: Internal Medicine

## 2019-08-20 ENCOUNTER — Other Ambulatory Visit: Payer: Self-pay

## 2019-08-20 ENCOUNTER — Encounter: Payer: Self-pay | Admitting: Internal Medicine

## 2019-08-20 VITALS — BP 163/85 | HR 58 | Temp 97.8°F | Resp 18 | Wt 172.0 lb

## 2019-08-20 DIAGNOSIS — C3491 Malignant neoplasm of unspecified part of right bronchus or lung: Secondary | ICD-10-CM | POA: Diagnosis not present

## 2019-08-20 DIAGNOSIS — N1832 Chronic kidney disease, stage 3b: Secondary | ICD-10-CM

## 2019-08-20 DIAGNOSIS — C7A8 Other malignant neuroendocrine tumors: Secondary | ICD-10-CM | POA: Diagnosis not present

## 2019-08-20 DIAGNOSIS — Z5111 Encounter for antineoplastic chemotherapy: Secondary | ICD-10-CM | POA: Diagnosis not present

## 2019-08-20 DIAGNOSIS — J449 Chronic obstructive pulmonary disease, unspecified: Secondary | ICD-10-CM

## 2019-08-20 DIAGNOSIS — C7A090 Malignant carcinoid tumor of the bronchus and lung: Secondary | ICD-10-CM | POA: Diagnosis not present
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
## 2019-08-21 ENCOUNTER — Telehealth: Payer: Self-pay | Admitting: Internal Medicine

## 2019-08-21 NOTE — Telephone Encounter (Signed)
Scheduled per 12/30 los. Called and spoke with pt, confirmed 1/27 appt. Mailing printout

## 2019-09-02 ENCOUNTER — Other Ambulatory Visit: Payer: Self-pay | Admitting: Family Medicine

## 2019-09-08 DIAGNOSIS — E118 Type 2 diabetes mellitus with unspecified complications: Secondary | ICD-10-CM | POA: Diagnosis not present

## 2019-09-08 DIAGNOSIS — H524 Presbyopia: Secondary | ICD-10-CM | POA: Diagnosis not present

## 2019-09-08 LAB — HM DIABETES EYE EXAM

## 2019-09-17 ENCOUNTER — Encounter: Payer: Self-pay | Admitting: Physician Assistant

## 2019-09-17 ENCOUNTER — Other Ambulatory Visit: Payer: Self-pay

## 2019-09-17 ENCOUNTER — Inpatient Hospital Stay: Payer: Medicare Other | Attending: Internal Medicine | Admitting: Physician Assistant

## 2019-09-17 ENCOUNTER — Inpatient Hospital Stay: Payer: Medicare Other

## 2019-09-17 VITALS — BP 174/85 | HR 74 | Temp 98.9°F | Resp 18 | Ht 75.0 in | Wt 174.0 lb

## 2019-09-17 DIAGNOSIS — C7A8 Other malignant neuroendocrine tumors: Secondary | ICD-10-CM | POA: Diagnosis not present

## 2019-09-17 DIAGNOSIS — Z5111 Encounter for antineoplastic chemotherapy: Secondary | ICD-10-CM | POA: Diagnosis not present

## 2019-09-17 DIAGNOSIS — I10 Essential (primary) hypertension: Secondary | ICD-10-CM | POA: Diagnosis not present

## 2019-09-17 DIAGNOSIS — N1832 Chronic kidney disease, stage 3b: Secondary | ICD-10-CM

## 2019-09-17 DIAGNOSIS — C7B02 Secondary carcinoid tumors of liver: Secondary | ICD-10-CM | POA: Insufficient documentation

## 2019-09-17 DIAGNOSIS — C3491 Malignant neoplasm of unspecified part of right bronchus or lung: Secondary | ICD-10-CM

## 2019-09-17 DIAGNOSIS — Z791 Long term (current) use of non-steroidal anti-inflammatories (NSAID): Secondary | ICD-10-CM | POA: Diagnosis not present

## 2019-09-17 DIAGNOSIS — N189 Chronic kidney disease, unspecified: Secondary | ICD-10-CM | POA: Insufficient documentation

## 2019-09-17 DIAGNOSIS — E119 Type 2 diabetes mellitus without complications: Secondary | ICD-10-CM | POA: Diagnosis not present

## 2019-09-17 DIAGNOSIS — C7A090 Malignant carcinoid tumor of the bronchus and lung: Secondary | ICD-10-CM | POA: Diagnosis not present

## 2019-09-17 DIAGNOSIS — Z79899 Other long term (current) drug therapy: Secondary | ICD-10-CM | POA: Diagnosis not present

## 2019-09-17 LAB — CBC WITH DIFFERENTIAL (CANCER CENTER ONLY)
Abs Immature Granulocytes: 0 10*3/uL (ref 0.00–0.07)
Basophils Absolute: 0 10*3/uL (ref 0.0–0.1)
Basophils Relative: 1 %
Eosinophils Absolute: 0 10*3/uL (ref 0.0–0.5)
Eosinophils Relative: 1 %
HCT: 36.8 % — ABNORMAL LOW (ref 39.0–52.0)
Hemoglobin: 12.7 g/dL — ABNORMAL LOW (ref 13.0–17.0)
Immature Granulocytes: 0 %
Lymphocytes Relative: 56 %
Lymphs Abs: 2.3 10*3/uL (ref 0.7–4.0)
MCH: 29.5 pg (ref 26.0–34.0)
MCHC: 34.5 g/dL (ref 30.0–36.0)
MCV: 85.4 fL (ref 80.0–100.0)
Monocytes Absolute: 0.3 10*3/uL (ref 0.1–1.0)
Monocytes Relative: 9 %
Neutro Abs: 1.3 10*3/uL — ABNORMAL LOW (ref 1.7–7.7)
Neutrophils Relative %: 33 %
Platelet Count: 104 10*3/uL — ABNORMAL LOW (ref 150–400)
RBC: 4.31 MIL/uL (ref 4.22–5.81)
RDW: 12.4 % (ref 11.5–15.5)
WBC Count: 3.9 10*3/uL — ABNORMAL LOW (ref 4.0–10.5)
nRBC: 0 % (ref 0.0–0.2)

## 2019-09-17 LAB — CMP (CANCER CENTER ONLY)
ALT: 17 U/L (ref 0–44)
AST: 17 U/L (ref 15–41)
Albumin: 3.7 g/dL (ref 3.5–5.0)
Alkaline Phosphatase: 77 U/L (ref 38–126)
Anion gap: 10 (ref 5–15)
BUN: 24 mg/dL — ABNORMAL HIGH (ref 8–23)
CO2: 28 mmol/L (ref 22–32)
Calcium: 8.7 mg/dL — ABNORMAL LOW (ref 8.9–10.3)
Chloride: 107 mmol/L (ref 98–111)
Creatinine: 1.78 mg/dL — ABNORMAL HIGH (ref 0.61–1.24)
GFR, Est AFR Am: 42 mL/min — ABNORMAL LOW (ref >60)
GFR, Estimated: 37 mL/min — ABNORMAL LOW (ref >60)
Glucose, Bld: 155 mg/dL — ABNORMAL HIGH (ref 70–99)
Potassium: 3.4 mmol/L — ABNORMAL LOW (ref 3.5–5.1)
Sodium: 145 mmol/L (ref 135–145)
Total Bilirubin: 0.3 mg/dL (ref 0.3–1.2)
Total Protein: 7.5 g/dL (ref 6.5–8.1)
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
## 2019-09-18 ENCOUNTER — Other Ambulatory Visit: Payer: Self-pay | Admitting: Interventional Radiology

## 2019-09-18 ENCOUNTER — Telehealth: Payer: Self-pay | Admitting: Family Medicine

## 2019-09-18 DIAGNOSIS — C7A8 Other malignant neuroendocrine tumors: Secondary | ICD-10-CM

## 2019-09-18 DIAGNOSIS — C7B8 Other secondary neuroendocrine tumors: Secondary | ICD-10-CM

## 2019-09-18 LAB — CHROMOGRANIN A: Chromogranin A (ng/mL): 1031 ng/mL — ABNORMAL HIGH (ref 0.0–101.8)

## 2019-09-18 NOTE — Telephone Encounter (Signed)
I received a letter from patient's ex-wife who is very supportive of patient and provides caregiver role.  Blood sugars have increased since starting Afinitor.  Freestyle libre shows sugars from 150s up to 267.  They mention he has metformin, glipizide 5 mg tablets and glimepiride 4 mg available   Please reach out to patient and have him restart 1/2 tablet of glipizide 5 mg so 2.5 mg total before breakfast.  He sees me in early February and can bring a log of blood sugars so we can evaluate if needs to take a dose before dinner

## 2019-09-19 NOTE — Telephone Encounter (Signed)
Called patient reviewed all information and had repeated back to me. Will call if any new question.

## 2019-09-22 ENCOUNTER — Telehealth: Payer: Self-pay | Admitting: Internal Medicine

## 2019-09-22 NOTE — Telephone Encounter (Signed)
Scheduled per los. Called, no voicemail. Mailed printout

## 2019-09-29 ENCOUNTER — Ambulatory Visit: Payer: Medicare Other

## 2019-09-30 ENCOUNTER — Other Ambulatory Visit: Payer: Self-pay

## 2019-09-30 ENCOUNTER — Ambulatory Visit (INDEPENDENT_AMBULATORY_CARE_PROVIDER_SITE_OTHER): Payer: Medicare Other | Admitting: Family Medicine

## 2019-09-30 ENCOUNTER — Encounter: Payer: Self-pay | Admitting: Family Medicine

## 2019-09-30 VITALS — BP 156/80 | HR 87 | Temp 98.7°F | Ht 75.0 in | Wt 172.0 lb

## 2019-09-30 DIAGNOSIS — E1159 Type 2 diabetes mellitus with other circulatory complications: Secondary | ICD-10-CM

## 2019-09-30 DIAGNOSIS — I10 Essential (primary) hypertension: Secondary | ICD-10-CM

## 2019-09-30 DIAGNOSIS — N1832 Chronic kidney disease, stage 3b: Secondary | ICD-10-CM | POA: Diagnosis not present

## 2019-09-30 DIAGNOSIS — E1121 Type 2 diabetes mellitus with diabetic nephropathy: Secondary | ICD-10-CM

## 2019-09-30 DIAGNOSIS — I152 Hypertension secondary to endocrine disorders: Secondary | ICD-10-CM

## 2019-09-30 DIAGNOSIS — E1122 Type 2 diabetes mellitus with diabetic chronic kidney disease: Secondary | ICD-10-CM

## 2019-09-30 MED ORDER — AMLODIPINE BESYLATE 10 MG PO TABS: 10.0000 mg | ORAL_TABLET | Freq: Every day | ORAL | 3 refills | Status: DC

## 2019-09-30 MED ORDER — GLIPIZIDE 5 MG PO TABS: 5.0000 mg | ORAL_TABLET | Freq: Two times a day (BID) | ORAL | 3 refills | Status: DC

## 2019-09-30 MED ORDER — FREESTYLE LIBRE 14 DAY SENSOR MISC: 5 refills | Status: DC

## 2019-09-30 NOTE — Patient Instructions (Addendum)
Health Maintenance Due  Topic Date Due  . OPHTHALMOLOGY EXAM had 2 weeks ago will send for records  02/12/2019  . COLONOSCOPY would like to hold off now  05/25/2019   Increase glipizide to 2.5 mg before breakfast and lunch for next week.   As long as no sugars under 80 in that time- increase to 5 mg in the morning and 2.5 mg before dinner in 1 week   As long as no sugars under 80 in that time- increase to 5 mg in the morning and 5 mg before dinner in 1 week      Recommended follow up: Increase amlodipine to 10mg  (also try before bed) and follow up in 3 weeks to check on sugar and blood pressure

## 2019-09-30 NOTE — Progress Notes (Signed)
Phone [phone] In person visit   Subjective:   Richard Navarro is a 74 y.o. year old very pleasant male patient who presents for/with See problem oriented charting Chief Complaint  Patient presents with  . Follow-up  . Diabetes   This visit occurred during the SARS-CoV-2 public health emergency.  Safety protocols were in place, including screening questions prior to the visit, additional usage of staff PPE, and extensive cleaning of exam room while observing appropriate contact time as indicated for disinfecting solutions.   Past Medical History-  Patient Active Problem List   Diagnosis Date Noted  . Anemia 05/07/2018    Priority: High  . Neuroendocrine carcinoma ([place]) 08/09/2017    Priority: High  . Primary cancer of right lung ([place]) 12/22/2016    Priority: High  . Diabetes mellitus with renal manifestation ([facility])     Priority: High  . CKD (chronic kidney disease), stage III 03/04/2018    Priority: Medium  . Myalgia due to statin 03/04/2018    Priority: Medium  . COPD (chronic obstructive pulmonary disease) ([place]) 12/01/2016    Priority: Medium  . PTSD (post-traumatic stress disorder) 12/07/2015    Priority: Medium  . Erectile dysfunction 04/09/2014    Priority: Medium  . Former smoker 03/26/2014    Priority: Medium  . Hypertension associated with diabetes ([place])     Priority: Medium  . Gout     Priority: Medium  . Depression     Priority: Medium  . Hyperlipidemia     Priority: Medium  . Thrombocytopenia ([place])     Priority: Low  . Pneumonia 08/09/2017    Priority: Low  . Encounter for antineoplastic chemotherapy 01/10/2017    Priority: Low  . Goals of care, counseling/discussion 01/10/2017    Priority: Low  . Prostate cancer screening 04/17/2014    Priority: Low  . Arthritis 03/26/2014    Priority: Low  . History of adenomatous polyp of colon 03/26/2014    Priority: Low  . GERD (gastroesophageal reflux disease)     Priority: Low    Medications-  reviewed and updated Current Outpatient Medications  Medication Sig Dispense Refill  . albuterol (VENTOLIN HFA) 108 (90 Base) MCG/ACT inhaler Inhale 2 puffs into the lungs every 4 (four) hours as needed for wheezing or shortness of breath. 3 Inhaler 2  . allopurinol (ZYLOPRIM) 100 MG tablet TAKE 1 TABLET DAILY 90 tablet 3  . Calcium Carbonate Antacid (CALCIUM CARBONATE, DOSED IN MG ELEMENTAL CALCIUM,) 1250 MG/5ML SUSP Take 5 mLs (500 mg of elemental calcium total) by mouth every 6 (six) hours as needed for indigestion. 450 mL   . Cholecalciferol (VITAMIN D3) 10 MCG/ML LIQD Take by mouth.    . Continuous Blood Gluc Sensor (FREESTYLE LIBRE 14 DAY SENSOR) MISC USE TO CHECK BLOOD SUGAR AS DIRECTED AND CHANGE EVERY 14 DAYS 2 each 5  . diclofenac sodium (VOLTAREN) 1 % GEL APPLY 2 GRAMS TOPICALLY FOUR TIMES A DAY AS NEEDED FOR LEFT SHOULDER ARTHRITIS (Patient taking differently: Apply 2 g topically 4 (four) times daily as needed. FOR LEFT SHOULDER ARTHRITIS) 300 g 3  . Ferrous Sulfate (IRON) 325 (65 Fe) MG TABS Take 1 tablet by mouth every other day.     Marland Kitchen glipiZIDE (GLUCOTROL) 5 MG tablet Take 1 tablet (5 mg total) by mouth 2 (two) times daily before a meal. 180 tablet 3  . glucose blood test strip Use to test blood sugar twice a day 200 each 3  . guaiFENesin (MUCINEX) 600 MG  12 hr tablet Take 600 mg by mouth 2 (two) times daily.     . metoprolol tartrate (LOPRESSOR) 25 MG tablet Take 0.5 tablets (12.5 mg total) by mouth 2 (two) times daily. 60 tablet 2  . pantoprazole (PROTONIX) 40 MG tablet Take 1 tablet (40 mg total) by mouth daily. 90 tablet 3  . SPIRIVA RESPIMAT 2.5 MCG/ACT AERS USE 2 INHALATIONS DAILY 12 g 3  . vitamin B-12 (CYANOCOBALAMIN) 1000 MCG tablet Take 1,000 mcg by mouth daily.     . vitamin C (ASCORBIC ACID) 500 MG tablet Take 500 mg by mouth daily.    Marland Kitchen amLODipine (NORVASC) 10 MG tablet Take 1 tablet (10 mg total) by mouth daily. 90 tablet 3   No current facility-administered  medications for this visit.     Objective:  BP (!) 156/80   Pulse 87   Temp 98.7 F (37.1 C) (Temporal)   Ht 6\' 3"  (1.905 m)   Wt 172 lb (78 kg)   SpO2 95%   BMI 21.50 kg/m  Gen: NAD, resting comfortably CV: RRR no murmurs rubs or gallops Lungs: CTAB no crackles, wheeze, rhonchi Ext: no edema Skin: warm, dry     Assessment and Plan   # HM- first covid shot on the 5th and goes back on 26th.   #Chronic kidney disease stage III S: Patient with significant worsening in kidney function dating back to November when patient was on Afinitor previously-had worsening renal function in light of Afinitor as well as prior contrast studies most likely.  Fortunately filtration rate has improved to 42 on most recent check January 27-reviewed from oncology visit.  He is following along with [facility] A/P: I am so thankful filtration rate has improved back into the 40s from low below 30 with low of 16.  Patient knows to avoid NSAIDs.  We will continue to try to control his blood pressure.  Will follow along with oncology labs  #Diabetes mellitus S: No Rx as of 06/23/2019.  We restarted glipizide 2.5 mg before breakfast due to CBGs going up from 150s to 267 back on afinitor.  Prior to reduce GFR-patient had been on  metformin 5 mg twice a day and Amaryl 2 mg daily.  Later changed to glipizide then this was later stopped  Average blood sugar on home meter for last few weeks at 200. Has had some #s into 300s.  A/P: Home average recently over 200 on glipizide 2.5 mg before breakfast.  We will titrate up to 5 mg twice a day over the coming weeks and patient will follow up with me in 3 weeks.  Consider adding Metformin but would use very low-dose considering CKD stage III and prior worsening to CKD stage IV range  #Hypertension associated with diabetes S: Compliant with amlodipine 5 mg (in morning) and metoprolol 12.5 mg twice daily. Home #s 160s or 170s.  BP Readings from Last 3 Encounters:   09/30/19 (!) 156/80  09/17/19 (!) 174/85  08/20/19 (!) 163/85  A/P: Poor control today and at home.  Increase amlodipine to 10 mg and shift from morning dose to evening dose with follow-up in 3 weeks  Recommended follow up: 3-week follow-up recommended Future Appointments  Date Time Provider [place]  10/13/2019 12:00 PM [facility] 1 [facility]  10/15/2019  9:00 AM Marzetta Board, DPM [facility]  10/16/2019  1:00 PM [facility]  10/21/2019  1:40 PM [name], MD [facility]  10/29/2019 10:15  AM [facility] 5 [facility] None  10/29/2019 10:45 AM Curt Bears, MD [facility] None    Lab/Order associations:   ICD-10-CM   1. Type 2 diabetes mellitus with stage 3b chronic kidney disease, without long-term current use of insulin (H[facility])  E11.21    N18.32   2. Stage 3b chronic kidney disease  N18.32   3. Hypertension associated with diabetes ([place])  E11.59    I10     Meds ordered this encounter  Medications  . DISCONTD: glipiZIDE (GLUCOTROL) 5 MG tablet    Sig: Take 1 tablet (5 mg total) by mouth 2 (two) times daily before a meal. 1/2 tab dailly    Dispense:  180 tablet    Refill:  3  . glipiZIDE (GLUCOTROL) 5 MG tablet    Sig: Take 1 tablet (5 mg total) by mouth 2 (two) times daily before a meal.    Dispense:  180 tablet    Refill:  3  . Continuous Blood Gluc Sensor (FREESTYLE LIBRE 14 DAY SENSOR) MISC    Sig: USE TO CHECK BLOOD SUGAR AS DIRECTED AND CHANGE EVERY 14 DAYS    Dispense:  2 each    Refill:  5  . amLODipine (NORVASC) 10 MG tablet    Sig: Take 1 tablet (10 mg total) by mouth daily.    Dispense:  90 tablet    Refill:  3    Time Spent: 36 minutes of total time (3:27 PM- 4:03 PM) was spent on the date of the encounter performing the following actions: chart review prior to seeing the patient, obtaining history, performing a medically necessary exam, counseling on the treatment plan, placing orders, and documenting  in our EHR.   Return precautions advised.  [name], MD

## 2019-10-01 ENCOUNTER — Telehealth: Payer: Self-pay | Admitting: Family Medicine

## 2019-10-01 NOTE — Telephone Encounter (Signed)
Returned the call to [facility] and provided clarification.

## 2019-10-01 NOTE — Telephone Encounter (Signed)
[facility] pharmacy called to clarify directions for Glipizide 5 MG tablets that were prescribed yesterday at pt's appt. Please return call to [phone]. Reference number is [id number].

## 2019-10-13 ENCOUNTER — Ambulatory Visit (HOSPITAL_COMMUNITY): Payer: Medicare Other

## 2019-10-14 ENCOUNTER — Other Ambulatory Visit: Payer: Self-pay | Admitting: Oncology

## 2019-10-15 ENCOUNTER — Ambulatory Visit (HOSPITAL_COMMUNITY)
Admission: RE | Admit: 2019-10-15 | Discharge: 2019-10-15 | Disposition: A | Discharge status: Home / Self Care | Payer: Medicare Other | Source: Ambulatory Visit | Attending: Interventional Radiology | Admitting: Interventional Radiology

## 2019-10-15 ENCOUNTER — Ambulatory Visit (INDEPENDENT_AMBULATORY_CARE_PROVIDER_SITE_OTHER): Payer: Medicare Other | Admitting: Podiatry

## 2019-10-15 ENCOUNTER — Other Ambulatory Visit: Payer: Self-pay

## 2019-10-15 ENCOUNTER — Encounter: Payer: Self-pay | Admitting: Podiatry

## 2019-10-15 VITALS — Temp 97.5°F

## 2019-10-15 DIAGNOSIS — M79675 Pain in left toe(s): Secondary | ICD-10-CM | POA: Diagnosis not present

## 2019-10-15 DIAGNOSIS — B351 Tinea unguium: Secondary | ICD-10-CM

## 2019-10-15 DIAGNOSIS — C787 Secondary malignant neoplasm of liver and intrahepatic bile duct: Secondary | ICD-10-CM | POA: Diagnosis not present

## 2019-10-15 DIAGNOSIS — M79674 Pain in right toe(s): Secondary | ICD-10-CM

## 2019-10-15 DIAGNOSIS — E1151 Type 2 diabetes mellitus with diabetic peripheral angiopathy without gangrene: Secondary | ICD-10-CM | POA: Diagnosis not present

## 2019-10-15 DIAGNOSIS — C7B8 Other secondary neuroendocrine tumors: Secondary | ICD-10-CM | POA: Insufficient documentation

## 2019-10-15 DIAGNOSIS — C7A8 Other malignant neuroendocrine tumors: Secondary | ICD-10-CM | POA: Diagnosis not present

## 2019-10-15 MED ORDER — GADOBUTROL 1 MMOL/ML IV SOLN
8.0000 mL | Freq: Once | INTRAVENOUS | Status: AC | PRN
  Administered 2019-10-15: 8 mL via INTRAVENOUS

## 2019-10-15 NOTE — Patient Instructions (Signed)
Diabetes Mellitus and Foot Care Foot care is an important part of your health, especially when you have diabetes. Diabetes may cause you to have problems because of poor blood flow (circulation) to your feet and legs, which can cause your skin to:  Become thinner and drier.  Break more easily.  Heal more slowly.  Peel and crack. You may also have nerve damage (neuropathy) in your legs and feet, causing decreased feeling in them. This means that you may not notice minor injuries to your feet that could lead to more serious problems. Noticing and addressing any potential problems early is the best way to prevent future foot problems. How to care for your feet Foot hygiene  Wash your feet daily with warm water and mild soap. Do not use hot water. Then, pat your feet and the areas between your toes until they are completely dry. Do not soak your feet as this can dry your skin.  Trim your toenails straight across. Do not dig under them or around the cuticle. File the edges of your nails with an emery board or nail file.  Apply a moisturizing lotion or petroleum jelly to the skin on your feet and to dry, brittle toenails. Use lotion that does not contain alcohol and is unscented. Do not apply lotion between your toes. Shoes and socks  Wear clean socks or stockings every day. Make sure they are not too tight. Do not wear knee-high stockings since they may decrease blood flow to your legs.  Wear shoes that fit properly and have enough cushioning. Always look in your shoes before you put them on to be sure there are no objects inside.  To break in new shoes, wear them for just a few hours a day. This prevents injuries on your feet. Wounds, scrapes, corns, and calluses  Check your feet daily for blisters, cuts, bruises, sores, and redness. If you cannot see the bottom of your feet, use a mirror or ask someone for help.  Do not cut corns or calluses or try to remove them with medicine.  If you  find a minor scrape, cut, or break in the skin on your feet, keep it and the skin around it clean and dry. You may clean these areas with mild soap and water. Do not clean the area with peroxide, alcohol, or iodine.  If you have a wound, scrape, corn, or callus on your foot, look at it several times a day to make sure it is healing and not infected. Check for: ? Redness, swelling, or pain. ? Fluid or blood. ? Warmth. ? Pus or a bad smell. General instructions  Do not cross your legs. This may decrease blood flow to your feet.  Do not use heating pads or hot water bottles on your feet. They may burn your skin. If you have lost feeling in your feet or legs, you may not know this is happening until it is too late.  Protect your feet from hot and cold by wearing shoes, such as at the beach or on hot pavement.  Schedule a complete foot exam at least once a year (annually) or more often if you have foot problems. If you have foot problems, report any cuts, sores, or bruises to your health care provider immediately. Contact a health care provider if:  You have a medical condition that increases your risk of infection and you have any cuts, sores, or bruises on your feet.  You have an injury that is not   healing.  You have redness on your legs or feet.  You feel burning or tingling in your legs or feet.  You have pain or cramps in your legs and feet.  Your legs or feet are numb.  Your feet always feel cold.  You have pain around a toenail. Get help right away if:  You have a wound, scrape, corn, or callus on your foot and: ? You have pain, swelling, or redness that gets worse. ? You have fluid or blood coming from the wound, scrape, corn, or callus. ? Your wound, scrape, corn, or callus feels warm to the touch. ? You have pus or a bad smell coming from the wound, scrape, corn, or callus. ? You have a fever. ? You have a red line going up your leg. Summary  Check your feet every day  for cuts, sores, red spots, swelling, and blisters.  Moisturize feet and legs daily.  Wear shoes that fit properly and have enough cushioning.  If you have foot problems, report any cuts, sores, or bruises to your health care provider immediately.  Schedule a complete foot exam at least once a year (annually) or more often if you have foot problems. This information is not intended to replace advice given to you by your health care provider. Make sure you discuss any questions you have with your health care provider. Document Revised: 04/30/2019 Document Reviewed: 09/08/2016 Elsevier Patient Education  2020 Elsevier Inc.  

## 2019-10-16 ENCOUNTER — Encounter: Payer: Self-pay | Admitting: *Deleted

## 2019-10-16 ENCOUNTER — Ambulatory Visit: Payer: Medicare Other

## 2019-10-16 ENCOUNTER — Ambulatory Visit
Admission: RE | Admit: 2019-10-16 | Discharge: 2019-10-16 | Disposition: A | Discharge status: Home / Self Care | Payer: Medicare Other | Source: Ambulatory Visit | Attending: Interventional Radiology | Admitting: Interventional Radiology

## 2019-10-16 DIAGNOSIS — C7A8 Other malignant neuroendocrine tumors: Secondary | ICD-10-CM

## 2019-10-16 DIAGNOSIS — C7A1 Malignant poorly differentiated neuroendocrine tumors: Secondary | ICD-10-CM | POA: Diagnosis not present

## 2019-10-16 DIAGNOSIS — C7A090 Malignant carcinoid tumor of the bronchus and lung: Secondary | ICD-10-CM | POA: Diagnosis not present

## 2019-10-16 DIAGNOSIS — C7B8 Other secondary neuroendocrine tumors: Secondary | ICD-10-CM

## 2019-10-16 HISTORY — PX: IR RADIOLOGIST EVAL & MGMT: IMG5224

## 2019-10-16 NOTE — Progress Notes (Signed)
Subjective: Richard Navarro presents today for follow up of preventative diabetic foot care and painful mycotic nails b/l that are difficult to trim. Pain interferes with ambulation. Aggravating factors include wearing enclosed shoe gear. Pain is relieved with periodic professional debridement.   He notes no new pedal problems on today's visit. He states his diabetes medications have been discontinued and his sugars have been elevated.  Allergies  Allergen Reactions  . Lisinopril Swelling    Angioedema- on this and afinitor same time  . Simvastatin Other (See Comments)    Joint ache     Objective: Vitals:   10/15/19 0904  Temp: (!) 97.5 F (36.4 C)    Vascular Examination:  Capillary fill time to digits <3s b/l, faintly palpable DP pulses b/l, non-palpable PT pulses b/l, pedal hair sparse b/l and skin temperature gradient within normal limits b/l  Dermatological Examination: Pedal skin is thin shiny, atrophic bilaterally, no open wounds bilaterally and toenails 1-5 b/l elongated, dystrophic, thickened, crumbly with subungual debris  Musculoskeletal: Normal muscle strength 5/5 to all lower extremity muscle groups bilaterally, no gross bony deformities bilaterally and no pain crepitus or joint limitation noted with ROM b/l  Neurological: Protective sensation intact 5/5 intact bilaterally with 10g monofilament b/l and vibratory sensation intact b/l  Lab Results  Component Value Date   HGBA1C 5.4 06/23/2019    Assessment: 1. Pain due to onychomycosis of toenails of both feet   2. Type II diabetes mellitus with peripheral circulatory disorder (HCC)    Plan: -Continue diabetic foot care principles. Literature dispensed on today.  -Toenails 1-5 b/l were debrided in length and girth with sterile nail nippers and dremel without iatrogenic bleeding. -Patient to continue soft, supportive shoe gear daily. -Patient to report any pedal injuries to medical professional  immediately. -Patient/POA to call should there be question/concern in the interim.  Return in about 3 months (around 01/12/2020) for diabetic nail trim.
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
## 2019-10-17 ENCOUNTER — Telehealth: Payer: Self-pay | Admitting: Medical Oncology

## 2019-10-17 NOTE — Telephone Encounter (Signed)
Sent to [facility] for signature.

## 2019-10-21 ENCOUNTER — Ambulatory Visit (INDEPENDENT_AMBULATORY_CARE_PROVIDER_SITE_OTHER): Payer: Medicare Other | Admitting: Family Medicine

## 2019-10-21 ENCOUNTER — Other Ambulatory Visit: Payer: Self-pay

## 2019-10-21 ENCOUNTER — Encounter: Payer: Self-pay | Admitting: Family Medicine

## 2019-10-21 VITALS — BP 130/70 | HR 73 | Temp 98.1°F | Ht 75.0 in | Wt 172.2 lb

## 2019-10-21 DIAGNOSIS — J449 Chronic obstructive pulmonary disease, unspecified: Secondary | ICD-10-CM | POA: Diagnosis not present

## 2019-10-21 DIAGNOSIS — I152 Hypertension secondary to endocrine disorders: Secondary | ICD-10-CM

## 2019-10-21 DIAGNOSIS — I10 Essential (primary) hypertension: Secondary | ICD-10-CM | POA: Diagnosis not present

## 2019-10-21 DIAGNOSIS — D696 Thrombocytopenia, unspecified: Secondary | ICD-10-CM

## 2019-10-21 DIAGNOSIS — F3342 Major depressive disorder, recurrent, in full remission: Secondary | ICD-10-CM | POA: Insufficient documentation

## 2019-10-21 DIAGNOSIS — N1832 Chronic kidney disease, stage 3b: Secondary | ICD-10-CM

## 2019-10-21 DIAGNOSIS — E1159 Type 2 diabetes mellitus with other circulatory complications: Secondary | ICD-10-CM | POA: Diagnosis not present

## 2019-10-21 DIAGNOSIS — E1121 Type 2 diabetes mellitus with diabetic nephropathy: Secondary | ICD-10-CM | POA: Diagnosis not present

## 2019-10-21 DIAGNOSIS — E1122 Type 2 diabetes mellitus with diabetic chronic kidney disease: Secondary | ICD-10-CM

## 2019-10-21 LAB — POCT GLYCOSYLATED HEMOGLOBIN (HGB A1C): Hemoglobin A1C: 9.1 % — AB (ref 4.0–5.6)

## 2019-10-21 MED ORDER — METOPROLOL TARTRATE 25 MG PO TABS: 12.5000 mg | ORAL_TABLET | Freq: Two times a day (BID) | ORAL | 3 refills | Status: DC

## 2019-10-21 MED ORDER — METFORMIN HCL 500 MG PO TABS: 250.0000 mg | ORAL_TABLET | Freq: Two times a day (BID) | ORAL | 3 refills | Status: DC

## 2019-10-21 MED ORDER — GLIPIZIDE 10 MG PO TABS: 10.0000 mg | ORAL_TABLET | Freq: Two times a day (BID) | ORAL | 3 refills | Status: DC

## 2019-10-21 NOTE — Assessment & Plan Note (Signed)
S: No Rx as of 06/23/2019 but later we had to start glipizide 2.5 mg before breakfast due to sugars increasing back on afinitor. Last visit average blood sugar was 200 or higher- some #s into 300s so we increased glipizide to 5 mg twice a day.  Lab Results  Component Value Date   HGBA1C 9.1 (A) 10/21/2019   - had been on metformin in past prior to reduced GFR- thankfully GFR has improved since that time  A/P: Worsening control of blood sugars despite increasing glipizide to 5 mg twice a day.  We will increase glipizide further to 10 mg twice a day.  We will add Metformin 250 mg twice a day-patient has been getting close monitoring of labs through oncology.  If there is any reduction in renal function again we will plan to stop Metformin and transition to Lantus-patient would prefer pills over injections but is willing to use injections if need be.

## 2019-10-21 NOTE — Assessment & Plan Note (Signed)
S: Compliant with amlodipine 10 mg (up from 5 mg last visit and moved to PM and metoprolol 12.5 mg twice daily. Does not check blood pressure at home much.  BP Readings from Last 3 Encounters:  10/21/19 130/70  09/30/19 (!) 156/80  09/17/19 (!) 174/85  A/P: Much improved control on amlodipine 10 mg with no significant edema.  Patient also compliant with metoprolol-we refilled this today

## 2019-10-21 NOTE — Patient Instructions (Addendum)
Health Maintenance Due  Topic Date Due  . OPHTHALMOLOGY EXAM will send for records had 09/08/2019 02/12/2019  . COLONOSCOPY would like to hold off for now  05/25/2019   Lets try low dose metformin 250mg  twice a day for next month and follow up at that time. Average blood sugar for last 2 weeks was 236 and our goal hopefully by next visit is to get you closer to 200 and in the long run back to average of 150. We may have to use insulin but we can try pills first.  -exercise can certainly help and is essentially another medicine  Also increase glipizide to 10 mg tiwce a day  Keep blood pressure medicine the same  Recommended follow up: Return in about 1 month (around 11/21/2019) for follow up- or sooner if needed if blood sugar gets higher (have to consider insulin).

## 2019-10-21 NOTE — Progress Notes (Signed)
Phone [phone] In person visit   Subjective:   Richard Navarro is a 74 y.o. year old very pleasant male patient who presents for/with See problem oriented charting Chief Complaint  Patient presents with  . Diabetes   This visit occurred during the SARS-CoV-2 public health emergency.  Safety protocols were in place, including screening questions prior to the visit, additional usage of staff PPE, and extensive cleaning of exam room while observing appropriate contact time as indicated for disinfecting solutions.   Past Medical History-  Patient Active Problem List   Diagnosis Date Noted  . Anemia 05/07/2018    Priority: High  . CKD (chronic kidney disease), stage III 03/04/2018    Priority: High  . Neuroendocrine carcinoma ([place]) 08/09/2017    Priority: High  . Primary cancer of right lung ([place]) 12/22/2016    Priority: High  . Diabetes mellitus with renal manifestation (HCC)     Priority: High  . Myalgia due to statin 03/04/2018    Priority: Medium  . COPD (chronic obstructive pulmonary disease) ([place]) 12/01/2016    Priority: Medium  . PTSD (post-traumatic stress disorder) 12/07/2015    Priority: Medium  . Erectile dysfunction 04/09/2014    Priority: Medium  . Former smoker 03/26/2014    Priority: Medium  . Hypertension associated with diabetes ([place])     Priority: Medium  . Gout     Priority: Medium  . Depression     Priority: Medium  . Hyperlipidemia     Priority: Medium  . Thrombocytopenia ([place])     Priority: Low  . Pneumonia 08/09/2017    Priority: Low  . Encounter for antineoplastic chemotherapy 01/10/2017    Priority: Low  . Goals of care, counseling/discussion 01/10/2017    Priority: Low  . Prostate cancer screening 04/17/2014    Priority: Low  . Arthritis 03/26/2014    Priority: Low  . History of adenomatous polyp of colon 03/26/2014    Priority: Low  . GERD (gastroesophageal reflux disease)     Priority: Low  . Recurrent major depressive  disorder, in full remission ([place]) 10/21/2019    Medications- reviewed and updated Current Outpatient Medications  Medication Sig Dispense Refill  . AFINITOR 7.5 MG tablet     . albuterol (VENTOLIN HFA) 108 (90 Base) MCG/ACT inhaler Inhale 2 puffs into the lungs every 4 (four) hours as needed for wheezing or shortness of breath. 3 Inhaler 2  . allopurinol (ZYLOPRIM) 100 MG tablet TAKE 1 TABLET DAILY 90 tablet 3  . amLODipine (NORVASC) 10 MG tablet Take 1 tablet (10 mg total) by mouth daily. 90 tablet 3  . Calcium Carbonate Antacid (CALCIUM CARBONATE, DOSED IN MG ELEMENTAL CALCIUM,) 1250 MG/5ML SUSP Take 5 mLs (500 mg of elemental calcium total) by mouth every 6 (six) hours as needed for indigestion. 450 mL   . Cholecalciferol (VITAMIN D3) 10 MCG/ML LIQD Take by mouth.    . Continuous Blood Gluc Sensor (FREESTYLE LIBRE 14 DAY SENSOR) MISC USE TO CHECK BLOOD SUGAR AS DIRECTED AND CHANGE EVERY 14 DAYS 2 each 5  . diclofenac sodium (VOLTAREN) 1 % GEL APPLY 2 GRAMS TOPICALLY FOUR TIMES A DAY AS NEEDED FOR LEFT SHOULDER ARTHRITIS (Patient taking differently: Apply 2 g topically 4 (four) times daily as needed. FOR LEFT SHOULDER ARTHRITIS) 300 g 3  . Ferrous Sulfate (IRON) 325 (65 Fe) MG TABS Take 1 tablet by mouth every other day.     Marland Kitchen glipiZIDE (GLUCOTROL) 5 MG tablet Take 1 tablet (5 mg  total) by mouth 2 (two) times daily before a meal. 180 tablet 3  . glucose blood test strip Use to test blood sugar twice a day 200 each 3  . guaiFENesin (MUCINEX) 600 MG 12 hr tablet Take 600 mg by mouth 2 (two) times daily.     . metoprolol tartrate (LOPRESSOR) 25 MG tablet Take 0.5 tablets (12.5 mg total) by mouth 2 (two) times daily. 60 tablet 2  . pantoprazole (PROTONIX) 40 MG tablet Take 1 tablet (40 mg total) by mouth daily. 90 tablet 3  . SPIRIVA RESPIMAT 2.5 MCG/ACT AERS USE 2 INHALATIONS DAILY 12 g 3  . vitamin B-12 (CYANOCOBALAMIN) 1000 MCG tablet Take 1,000 mcg by mouth daily.     . vitamin C (ASCORBIC  ACID) 500 MG tablet Take 500 mg by mouth daily.     No current facility-administered medications for this visit.     Objective:  BP 130/70   Pulse 73   Temp 98.1 F (36.7 C) (Temporal)   Ht 6\' 3"  (1.905 m)   Wt 172 lb 3.2 oz (78.1 kg)   SpO2 98%   BMI 21.52 kg/m  Gen: NAD, resting comfortably     Assessment and Plan   #Diabetes mellitus S: No Rx as of 06/23/2019 but later we had to start glipizide 2.5 mg before breakfast due to sugars increasing back on afinitor. Last visit average blood sugar was 200 or higher- some #s into 300s so we increased glipizide to 5 mg twice a day.  Lab Results  Component Value Date   HGBA1C 9.1 (A) 10/21/2019   - had been on metformin in past prior to reduced GFR- thankfully GFR has improved since that time  A/P: Worsening control of blood sugars despite increasing glipizide to 5 mg twice a day.  We will increase glipizide further to 10 mg twice a day.  We will add Metformin 250 mg twice a day-patient has been getting close monitoring of labs through oncology.  If there is any reduction in renal function again we will plan to stop Metformin and transition to Lantus-patient would prefer pills over injections but is willing to use injections if need be. -1 month follow-up planned  #Hypertension associated with diabetes S: Compliant with amlodipine 10 mg (up from 5 mg last visit and moved to PM and metoprolol 12.5 mg twice daily. Does not check blood pressure at home much.   Despite metoprolol they have noticed HR up into 90s BP Readings from Last 3 Encounters:  10/21/19 130/70  09/30/19 (!) 156/80  09/17/19 (!) 174/85  A/P: Much improved control on amlodipine 10 mg with no significant edema.  Patient also compliant with metoprolol-we refilled this today  # Depression S: PHQ-9 was updated at last visit and was under 5.  Patient is not currently on medication Depression screen [facility] 2/9 09/30/2019  Decreased Interest 0  Down, Depressed, Hopeless 1   PHQ - 2 Score 1  Altered sleeping 2  Tired, decreased energy 1  Change in appetite 0  Feeling bad or failure about yourself  0  Trouble concentrating 0  Moving slowly or fidgety/restless 0  Suicidal thoughts 0  PHQ-9 Score 4  Difficult doing work/chores Not difficult at all  Some recent data might be hidden  A/P: Recurrent major depressive disorder, in full remission ([place]).  Continue without medications and continue to monitor  Chronic obstructive pulmonary disease, unspecified COPD type ([place]), Chronic S: Some breathing issues with lung cancer and COPD no wheezing. Occasionally  gets sneezing attacks or coughing fits.  He also notes seems to get fatigued after sneezing or coughing attacks.  He is compliant with Spiriva and has albuterol available as needed A/P: COPD appears largely stable.  Continue Spiriva.  It would be reasonable to trial albuterol during coughing fits to see if that helps  Thrombocytopenia, unspecified ([place]), Chronic S: This is being monitored closely by oncology.  Patient is on Afinitor and I wonder if this could affect his levels.  Has a mild leukocytosis as well as mild anemia on last check-technically has pancytopenia Lab Results  Component Value Date   WBC 3.9 (L) 09/17/2019   HGB 12.7 (L) 09/17/2019   HCT 36.8 (L) 09/17/2019   MCV 85.4 09/17/2019   PLT 104 (L) 09/17/2019  A/P: Hematology has not suggested further work-up and I suspect could be related to medications-these will be watched closely through hematology-if worsening we could specifically ask their opinion  Recommended follow up: Return in about 1 month (around 11/21/2019) for follow up- or sooner if needed if blood sugar gets higher (have to consider insulin). Future Appointments  Date Time Provider [facility] [place]  10/29/2019 10:15 AM [facility] 5 [facility] None  10/29/2019 10:45 AM Curt Bears, MD [facility] None  12/01/2019  4:00 PM [name], MD [facility]  01/14/2020   9:45 AM Marzetta Board, DPM [facility]    Lab/Order associations:   ICD-10-CM   1. Type 2 diabetes mellitus with stage 3b chronic kidney disease, without long-term current use of insulin (HCC)  E11.21 POCT HgB A1C   N18.32   2. Recurrent major depressive disorder, in full remission ([place])  F33.42   3. Chronic obstructive pulmonary disease, unspecified COPD type (HCC) Chronic J44.9   4. Thrombocytopenia, unspecified (HCC) Chronic D69.6   5. Hypertension associated with diabetes ([place])  E11.59    I10     Meds ordered this encounter  Medications  . metoprolol tartrate (LOPRESSOR) 25 MG tablet    Sig: Take 0.5 tablets (12.5 mg total) by mouth 2 (two) times daily.    Dispense:  90 tablet    Refill:  3  . metFORMIN (GLUCOPHAGE) 500 MG tablet    Sig: Take 0.5 tablets (250 mg total) by mouth 2 (two) times daily with a meal.    Dispense:  90 tablet    Refill:  3  . glipiZIDE (GLUCOTROL) 10 MG tablet    Sig: Take 1 tablet (10 mg total) by mouth 2 (two) times daily before a meal.    Dispense:  180 tablet    Refill:  3   Return precautions advised.  [name], MD

## 2019-10-22 ENCOUNTER — Encounter: Payer: Self-pay | Admitting: Family Medicine

## 2019-10-29 ENCOUNTER — Inpatient Hospital Stay: Payer: Medicare Other

## 2019-10-29 ENCOUNTER — Other Ambulatory Visit: Payer: Self-pay | Admitting: Medical Oncology

## 2019-10-29 ENCOUNTER — Encounter: Payer: Self-pay | Admitting: Internal Medicine

## 2019-10-29 ENCOUNTER — Inpatient Hospital Stay: Payer: Medicare Other | Attending: Internal Medicine | Admitting: Internal Medicine

## 2019-10-29 ENCOUNTER — Other Ambulatory Visit: Payer: Self-pay

## 2019-10-29 VITALS — BP 146/82 | HR 89 | Temp 98.3°F | Resp 18 | Ht 75.0 in | Wt 171.1 lb

## 2019-10-29 DIAGNOSIS — Z5111 Encounter for antineoplastic chemotherapy: Secondary | ICD-10-CM

## 2019-10-29 DIAGNOSIS — C7A8 Other malignant neuroendocrine tumors: Secondary | ICD-10-CM

## 2019-10-29 DIAGNOSIS — E1159 Type 2 diabetes mellitus with other circulatory complications: Secondary | ICD-10-CM | POA: Diagnosis not present

## 2019-10-29 DIAGNOSIS — Z79899 Other long term (current) drug therapy: Secondary | ICD-10-CM | POA: Insufficient documentation

## 2019-10-29 DIAGNOSIS — N1832 Chronic kidney disease, stage 3b: Secondary | ICD-10-CM | POA: Diagnosis not present

## 2019-10-29 DIAGNOSIS — Z7189 Other specified counseling: Secondary | ICD-10-CM | POA: Diagnosis not present

## 2019-10-29 DIAGNOSIS — Z7984 Long term (current) use of oral hypoglycemic drugs: Secondary | ICD-10-CM | POA: Diagnosis not present

## 2019-10-29 DIAGNOSIS — C3491 Malignant neoplasm of unspecified part of right bronchus or lung: Secondary | ICD-10-CM

## 2019-10-29 DIAGNOSIS — E119 Type 2 diabetes mellitus without complications: Secondary | ICD-10-CM | POA: Diagnosis not present

## 2019-10-29 DIAGNOSIS — K5900 Constipation, unspecified: Secondary | ICD-10-CM | POA: Insufficient documentation

## 2019-10-29 DIAGNOSIS — Z791 Long term (current) use of non-steroidal anti-inflammatories (NSAID): Secondary | ICD-10-CM | POA: Insufficient documentation

## 2019-10-29 DIAGNOSIS — C7B02 Secondary carcinoid tumors of liver: Secondary | ICD-10-CM | POA: Insufficient documentation

## 2019-10-29 DIAGNOSIS — D638 Anemia in other chronic diseases classified elsewhere: Secondary | ICD-10-CM | POA: Diagnosis not present

## 2019-10-29 DIAGNOSIS — N189 Chronic kidney disease, unspecified: Secondary | ICD-10-CM | POA: Insufficient documentation

## 2019-10-29 DIAGNOSIS — I152 Hypertension secondary to endocrine disorders: Secondary | ICD-10-CM

## 2019-10-29 DIAGNOSIS — I10 Essential (primary) hypertension: Secondary | ICD-10-CM

## 2019-10-29 DIAGNOSIS — E785 Hyperlipidemia, unspecified: Secondary | ICD-10-CM | POA: Diagnosis not present

## 2019-10-29 DIAGNOSIS — E860 Dehydration: Secondary | ICD-10-CM | POA: Diagnosis not present

## 2019-10-29 DIAGNOSIS — C7A090 Malignant carcinoid tumor of the bronchus and lung: Secondary | ICD-10-CM | POA: Diagnosis not present

## 2019-10-29 LAB — CBC WITH DIFFERENTIAL (CANCER CENTER ONLY)
Abs Immature Granulocytes: 0.01 10*3/uL (ref 0.00–0.07)
Basophils Absolute: 0 10*3/uL (ref 0.0–0.1)
Basophils Relative: 1 %
Eosinophils Absolute: 0 10*3/uL (ref 0.0–0.5)
Eosinophils Relative: 1 %
HCT: 30.3 % — ABNORMAL LOW (ref 39.0–52.0)
Hemoglobin: 10.1 g/dL — ABNORMAL LOW (ref 13.0–17.0)
Immature Granulocytes: 0 %
Lymphocytes Relative: 32 %
Lymphs Abs: 1.4 10*3/uL (ref 0.7–4.0)
MCH: 28.7 pg (ref 26.0–34.0)
MCHC: 33.3 g/dL (ref 30.0–36.0)
MCV: 86.1 fL (ref 80.0–100.0)
Monocytes Absolute: 0.4 10*3/uL (ref 0.1–1.0)
Monocytes Relative: 9 %
Neutro Abs: 2.6 10*3/uL (ref 1.7–7.7)
Neutrophils Relative %: 57 %
Platelet Count: 142 10*3/uL — ABNORMAL LOW (ref 150–400)
RBC: 3.52 MIL/uL — ABNORMAL LOW (ref 4.22–5.81)
RDW: 13.9 % (ref 11.5–15.5)
WBC Count: 4.4 10*3/uL (ref 4.0–10.5)
nRBC: 0 % (ref 0.0–0.2)

## 2019-10-29 LAB — CMP (CANCER CENTER ONLY)
ALT: 12 U/L (ref 0–44)
AST: 18 U/L (ref 15–41)
Albumin: 3.5 g/dL (ref 3.5–5.0)
Alkaline Phosphatase: 73 U/L (ref 38–126)
Anion gap: 9 (ref 5–15)
BUN: 23 mg/dL (ref 8–23)
CO2: 26 mmol/L (ref 22–32)
Calcium: 8.9 mg/dL (ref 8.9–10.3)
Chloride: 105 mmol/L (ref 98–111)
Creatinine: 1.98 mg/dL — ABNORMAL HIGH (ref 0.61–1.24)
GFR, Est AFR Am: 37 mL/min — ABNORMAL LOW (ref >60)
GFR, Estimated: 32 mL/min — ABNORMAL LOW (ref >60)
Glucose, Bld: 277 mg/dL — ABNORMAL HIGH (ref 70–99)
Potassium: 3.4 mmol/L — ABNORMAL LOW (ref 3.5–5.1)
Sodium: 140 mmol/L (ref 135–145)
Total Bilirubin: 0.4 mg/dL (ref 0.3–1.2)
Total Protein: 7.3 g/dL (ref 6.5–8.1)

## 2019-10-29 MED ORDER — TEMOZOLOMIDE 100 MG PO CAPS: ORAL_CAPSULE | ORAL | 3 refills | Status: DC

## 2019-10-29 MED ORDER — ONDANSETRON HCL 8 MG PO TABS: 8.0000 mg | ORAL_TABLET | Freq: Three times a day (TID) | ORAL | 0 refills | Status: DC | PRN

## 2019-10-29 MED ORDER — SODIUM CHLORIDE 0.9 % IV SOLN
INTRAVENOUS | Status: DC
  Filled 2019-10-29 (×2): qty 250

## 2019-10-29 MED ORDER — CAPECITABINE 500 MG PO TABS: ORAL_TABLET | ORAL | 3 refills | Status: DC
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
## 2019-10-30 ENCOUNTER — Telehealth: Payer: Self-pay | Admitting: Internal Medicine

## 2019-10-30 ENCOUNTER — Telehealth: Payer: Self-pay | Admitting: Medical Oncology

## 2019-10-30 LAB — CHROMOGRANIN A: Chromogranin A (ng/mL): 1525 ng/mL — ABNORMAL HIGH (ref 0.0–101.8)

## 2019-10-30 NOTE — Telephone Encounter (Signed)
Temodar tablets -[facility] only has 100 mg tablets.   Please resend order -dose/frequency  is unclear.

## 2019-10-30 NOTE — Telephone Encounter (Signed)
The dose is 400 mg p.o. daily from day 10-14 of the 28-day cycle.  So at they have only 100 mg tablets, he will need to take 4 tablets daily on days 10-14.  Thank you

## 2019-10-30 NOTE — Telephone Encounter (Signed)
Scheduled per 3/10 los. Called and spoke with patient and wife. Confirmed appts

## 2019-10-31 ENCOUNTER — Other Ambulatory Visit: Payer: Self-pay | Admitting: Internal Medicine

## 2019-10-31 MED ORDER — TEMOZOLOMIDE 100 MG PO CAPS: ORAL_CAPSULE | ORAL | 3 refills | Status: DC

## 2019-10-31 NOTE — Telephone Encounter (Signed)
Done

## 2019-11-03 ENCOUNTER — Other Ambulatory Visit: Payer: Self-pay | Admitting: Medical Oncology

## 2019-11-03 MED ORDER — TEMOZOLOMIDE 100 MG PO CAPS: ORAL_CAPSULE | ORAL | 3 refills | Status: DC

## 2019-11-05 ENCOUNTER — Telehealth: Payer: Self-pay | Admitting: Pharmacist

## 2019-11-05 NOTE — Telephone Encounter (Signed)
Oral Oncology Pharmacist Encounter  Received new prescription for Temodar (temozolomide) for the treatment of stage IV low- grade neuroendocrine carcinoma in conjunction with capecitabine, planned duration until disease progression or unacceptable drug toxicity.  Prescription dose and frequency assessed.   Current medication list in Epic reviewed, no DDIs with temozolomide identified.  Prescription has been e-scribed to the [facility] for benefits analysis and approval.  Xeloda (capecitabine) was sent to and filled by [facility]. [facility] was unable to get the Temodar in stock for Richard Navarro so they prescription was redirected to [place].  Oral Oncology Clinic will continue to follow for insurance authorization, copayment issues, initial counseling and start date.  Darl Pikes, PharmD, BCPS, BCOP, CPP Hematology/Oncology Clinical Pharmacist [facility] [phone]  11/05/2019 11:59 AM

## 2019-11-05 NOTE — Telephone Encounter (Signed)
Oral Chemotherapy Pharmacist Encounter  Patient plans on picking up his medication from [place] tomorrow 11/06/19. To keep things easy with the pill trays they have an home, he will wait until Sunday 11/09/19 to get started.  Patient Education I spoke with patient and his wife for overview of new oral chemotherapy medication: Temodar (temozolomide) for the treatment of stage IV low- grade neuroendocrine carcinoma in conjunction with capecitabine, planned duration until disease progression or unacceptable drug toxicity.   Counseled them on administration, dosing, side effects, monitoring, drug-food interactions, safe handling, storage, and disposal. Patient will take: -Xeloda (capecitabine): Take 3 tablets (1500mg ) by mouth twice daily. Take within 30 minutes of a meal. Take for 14 days, then take 14 days off. Repeat every 28 days. -Temodar (temozolomide): 4 capsules (400mg ) by mouth daily from days 10-14 every 28 weeks. May take on an empty stomach or at bedtime to decrease nausea & vomiting  Side effects include but not limited to:  Xeloda: diarrhea, hand-foot syndrome, decreased wbc, fatigue Temodar: N/V, constipation, decreased wbc  Reviewed the importance of keeping a medication schedule and plan for any missed doses.  Mr. and Mrs. Mossa voiced understanding and appreciation. All questions answered. Medication handout and calendar placed in the mail.  Provided patient with [facility] phone number. Patient knows to call the office with questions or concerns. Oral Chemotherapy [facility] will continue to follow.  Darl Pikes, PharmD, BCPS, BCOP, CPP Hematology/Oncology Clinical Pharmacist [facility] [phone]  11/05/2019 4:32 PM

## 2019-11-06 MED FILL — TEMOZOLOMIDE 100 MG CAPS: 100 | 28 days supply | Qty: 20 | Fill #0

## 2019-11-19 ENCOUNTER — Other Ambulatory Visit: Payer: Self-pay | Admitting: Medical Oncology

## 2019-11-19 ENCOUNTER — Inpatient Hospital Stay: Payer: Medicare Other

## 2019-11-19 ENCOUNTER — Other Ambulatory Visit: Payer: Self-pay

## 2019-11-19 ENCOUNTER — Encounter: Payer: Self-pay | Admitting: Internal Medicine

## 2019-11-19 ENCOUNTER — Inpatient Hospital Stay (HOSPITAL_BASED_OUTPATIENT_CLINIC_OR_DEPARTMENT_OTHER): Payer: Medicare Other | Admitting: Internal Medicine

## 2019-11-19 VITALS — BP 134/62 | HR 87 | Temp 99.1°F | Resp 18 | Ht 75.0 in | Wt 172.2 lb

## 2019-11-19 DIAGNOSIS — C7A8 Other malignant neuroendocrine tumors: Secondary | ICD-10-CM

## 2019-11-19 DIAGNOSIS — C3491 Malignant neoplasm of unspecified part of right bronchus or lung: Secondary | ICD-10-CM

## 2019-11-19 DIAGNOSIS — R112 Nausea with vomiting, unspecified: Secondary | ICD-10-CM

## 2019-11-19 DIAGNOSIS — N1832 Chronic kidney disease, stage 3b: Secondary | ICD-10-CM | POA: Diagnosis not present

## 2019-11-19 DIAGNOSIS — D631 Anemia in chronic kidney disease: Secondary | ICD-10-CM

## 2019-11-19 DIAGNOSIS — D638 Anemia in other chronic diseases classified elsewhere: Secondary | ICD-10-CM | POA: Diagnosis not present

## 2019-11-19 DIAGNOSIS — I10 Essential (primary) hypertension: Secondary | ICD-10-CM

## 2019-11-19 DIAGNOSIS — D630 Anemia in neoplastic disease: Secondary | ICD-10-CM

## 2019-11-19 DIAGNOSIS — N189 Chronic kidney disease, unspecified: Secondary | ICD-10-CM | POA: Diagnosis not present

## 2019-11-19 DIAGNOSIS — E860 Dehydration: Secondary | ICD-10-CM | POA: Diagnosis not present

## 2019-11-19 DIAGNOSIS — E1159 Type 2 diabetes mellitus with other circulatory complications: Secondary | ICD-10-CM

## 2019-11-19 DIAGNOSIS — Z5111 Encounter for antineoplastic chemotherapy: Secondary | ICD-10-CM | POA: Diagnosis not present

## 2019-11-19 DIAGNOSIS — C7B02 Secondary carcinoid tumors of liver: Secondary | ICD-10-CM | POA: Diagnosis not present

## 2019-11-19 DIAGNOSIS — I152 Hypertension secondary to endocrine disorders: Secondary | ICD-10-CM

## 2019-11-19 DIAGNOSIS — C7A090 Malignant carcinoid tumor of the bronchus and lung: Secondary | ICD-10-CM | POA: Diagnosis not present

## 2019-11-19 LAB — CBC WITH DIFFERENTIAL (CANCER CENTER ONLY)
Abs Immature Granulocytes: 0.04 10*3/uL (ref 0.00–0.07)
Basophils Absolute: 0 10*3/uL (ref 0.0–0.1)
Basophils Relative: 0 %
Eosinophils Absolute: 0 10*3/uL (ref 0.0–0.5)
Eosinophils Relative: 0 %
HCT: 25.4 % — ABNORMAL LOW (ref 39.0–52.0)
Hemoglobin: 8 g/dL — ABNORMAL LOW (ref 13.0–17.0)
Immature Granulocytes: 1 %
Lymphocytes Relative: 20 %
Lymphs Abs: 1.7 10*3/uL (ref 0.7–4.0)
MCH: 27.9 pg (ref 26.0–34.0)
MCHC: 31.5 g/dL (ref 30.0–36.0)
MCV: 88.5 fL (ref 80.0–100.0)
Monocytes Absolute: 0.6 10*3/uL (ref 0.1–1.0)
Monocytes Relative: 6 %
Neutro Abs: 6.3 10*3/uL (ref 1.7–7.7)
Neutrophils Relative %: 73 %
Platelet Count: 325 10*3/uL (ref 150–400)
RBC: 2.87 MIL/uL — ABNORMAL LOW (ref 4.22–5.81)
RDW: 15.9 % — ABNORMAL HIGH (ref 11.5–15.5)
WBC Count: 8.7 10*3/uL (ref 4.0–10.5)
nRBC: 0 % (ref 0.0–0.2)

## 2019-11-19 LAB — ABO/RH: ABO/RH(D): O NEG

## 2019-11-19 LAB — CMP (CANCER CENTER ONLY)
ALT: 19 U/L (ref 0–44)
AST: 17 U/L (ref 15–41)
Albumin: 3.2 g/dL — ABNORMAL LOW (ref 3.5–5.0)
Alkaline Phosphatase: 58 U/L (ref 38–126)
Anion gap: 14 (ref 5–15)
BUN: 23 mg/dL (ref 8–23)
CO2: 25 mmol/L (ref 22–32)
Calcium: 9.1 mg/dL (ref 8.9–10.3)
Chloride: 101 mmol/L (ref 98–111)
Creatinine: 2.17 mg/dL — ABNORMAL HIGH (ref 0.61–1.24)
GFR, Est AFR Am: 33 mL/min — ABNORMAL LOW (ref >60)
GFR, Estimated: 29 mL/min — ABNORMAL LOW (ref >60)
Glucose, Bld: 178 mg/dL — ABNORMAL HIGH (ref 70–99)
Potassium: 3.4 mmol/L — ABNORMAL LOW (ref 3.5–5.1)
Sodium: 140 mmol/L (ref 135–145)
Total Bilirubin: 0.5 mg/dL (ref 0.3–1.2)
Total Protein: 7.7 g/dL (ref 6.5–8.1)

## 2019-11-19 LAB — PREPARE RBC (CROSSMATCH)

## 2019-11-19 MED ORDER — ONDANSETRON HCL 4 MG/2ML IJ SOLN
8.0000 mg | Freq: Once | INTRAMUSCULAR | Status: AC
  Administered 2019-11-19: 8 mg via INTRAVENOUS

## 2019-11-19 MED ORDER — SODIUM CHLORIDE 0.9 % IV SOLN
Freq: Once | INTRAVENOUS | Status: AC
  Filled 2019-11-19: qty 250

## 2019-11-19 NOTE — Patient Instructions (Signed)
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
## 2019-11-20 ENCOUNTER — Telehealth: Payer: Self-pay | Admitting: Emergency Medicine

## 2019-11-20 ENCOUNTER — Telehealth: Payer: Self-pay | Admitting: Internal Medicine

## 2019-11-20 NOTE — Telephone Encounter (Signed)
Called pt to confirm blood transfusion appt tomorrow (11/21/19).  Pt states he will be wearing his blue blood bracelet for his transfusion tomorrow.  Denies any further questions/concerns at this time.

## 2019-11-20 NOTE — Telephone Encounter (Signed)
Scheduled per los. Called and spoke with [place]. Confirmed appts

## 2019-11-21 ENCOUNTER — Other Ambulatory Visit: Payer: Self-pay

## 2019-11-21 ENCOUNTER — Inpatient Hospital Stay: Payer: Medicare Other | Attending: Internal Medicine

## 2019-11-21 DIAGNOSIS — D630 Anemia in neoplastic disease: Secondary | ICD-10-CM | POA: Diagnosis not present

## 2019-11-21 DIAGNOSIS — Z79899 Other long term (current) drug therapy: Secondary | ICD-10-CM | POA: Insufficient documentation

## 2019-11-21 DIAGNOSIS — Z791 Long term (current) use of non-steroidal anti-inflammatories (NSAID): Secondary | ICD-10-CM | POA: Diagnosis not present

## 2019-11-21 DIAGNOSIS — C7B02 Secondary carcinoid tumors of liver: Secondary | ICD-10-CM | POA: Insufficient documentation

## 2019-11-21 DIAGNOSIS — C7A090 Malignant carcinoid tumor of the bronchus and lung: Secondary | ICD-10-CM | POA: Diagnosis not present

## 2019-11-21 DIAGNOSIS — Z7984 Long term (current) use of oral hypoglycemic drugs: Secondary | ICD-10-CM | POA: Insufficient documentation

## 2019-11-21 DIAGNOSIS — I10 Essential (primary) hypertension: Secondary | ICD-10-CM | POA: Diagnosis not present

## 2019-11-21 DIAGNOSIS — E785 Hyperlipidemia, unspecified: Secondary | ICD-10-CM | POA: Insufficient documentation

## 2019-11-21 DIAGNOSIS — N189 Chronic kidney disease, unspecified: Secondary | ICD-10-CM | POA: Insufficient documentation

## 2019-11-21 DIAGNOSIS — E119 Type 2 diabetes mellitus without complications: Secondary | ICD-10-CM | POA: Diagnosis not present

## 2019-11-21 MED ORDER — ACETAMINOPHEN 325 MG PO TABS
ORAL_TABLET | ORAL | Status: AC
  Filled 2019-11-21: qty 2

## 2019-11-21 MED ORDER — DIPHENHYDRAMINE HCL 25 MG PO CAPS
ORAL_CAPSULE | ORAL | Status: AC
  Filled 2019-11-21: qty 1

## 2019-11-21 MED ORDER — SODIUM CHLORIDE 0.9% IV SOLUTION
250.0000 mL | Freq: Once | INTRAVENOUS | Status: AC
  Administered 2019-11-21: 250 mL via INTRAVENOUS
  Filled 2019-11-21: qty 250

## 2019-11-21 MED ORDER — DIPHENHYDRAMINE HCL 25 MG PO CAPS
25.0000 mg | ORAL_CAPSULE | Freq: Once | ORAL | Status: AC
  Administered 2019-11-21: 25 mg via ORAL

## 2019-11-21 MED ORDER — ACETAMINOPHEN 325 MG PO TABS
650.0000 mg | ORAL_TABLET | Freq: Once | ORAL | Status: AC
  Administered 2019-11-21: 650 mg via ORAL

## 2019-11-21 NOTE — Progress Notes (Signed)
First unit of PRBCs labeled as 'hemoglobin S negative', no agreeing marker on green transfusion sheet attached to unit.  Contacted blood bank, per Merrilyn Puma ok to transfuse.  Pt does not have any specific antigen requirements for transfusion.  Confirmed phone call recommendation with other RN Bethena Roys.  Pt received 2 units PRBCs today, tolerated well.  VSS.  Able to drink and use restroom during tx, declined any food.  SO here to take pt home.  Pt denies any questions or concerns at time of d/c, aware to f/u as needed.

## 2019-11-21 NOTE — Patient Instructions (Signed)

## 2019-11-22 LAB — TYPE AND SCREEN
ABO/RH(D): O NEG
Antibody Screen: NEGATIVE
Unit division: 0
Unit division: 0

## 2019-11-22 LAB — BPAM RBC
Blood Product Expiration Date: 202104132359
Blood Product Expiration Date: 202104212359
ISSUE DATE / TIME: 202104020910
ISSUE DATE / TIME: 202104020910
Unit Type and Rh: 9500
Unit Type and Rh: 9500

## 2019-11-26 ENCOUNTER — Other Ambulatory Visit: Payer: Self-pay

## 2019-11-26 ENCOUNTER — Inpatient Hospital Stay: Payer: Medicare Other

## 2019-11-26 DIAGNOSIS — N189 Chronic kidney disease, unspecified: Secondary | ICD-10-CM | POA: Diagnosis not present

## 2019-11-26 DIAGNOSIS — C7B02 Secondary carcinoid tumors of liver: Secondary | ICD-10-CM | POA: Diagnosis not present

## 2019-11-26 DIAGNOSIS — E119 Type 2 diabetes mellitus without complications: Secondary | ICD-10-CM | POA: Diagnosis not present

## 2019-11-26 DIAGNOSIS — C7A090 Malignant carcinoid tumor of the bronchus and lung: Secondary | ICD-10-CM | POA: Diagnosis not present

## 2019-11-26 DIAGNOSIS — D630 Anemia in neoplastic disease: Secondary | ICD-10-CM | POA: Diagnosis not present

## 2019-11-26 DIAGNOSIS — C7A8 Other malignant neuroendocrine tumors: Secondary | ICD-10-CM

## 2019-11-26 DIAGNOSIS — Z79899 Other long term (current) drug therapy: Secondary | ICD-10-CM | POA: Diagnosis not present

## 2019-11-26 LAB — CBC WITH DIFFERENTIAL (CANCER CENTER ONLY)
Abs Immature Granulocytes: 0.02 10*3/uL (ref 0.00–0.07)
Basophils Absolute: 0 10*3/uL (ref 0.0–0.1)
Basophils Relative: 1 %
Eosinophils Absolute: 0.1 10*3/uL (ref 0.0–0.5)
Eosinophils Relative: 1 %
HCT: 32.7 % — ABNORMAL LOW (ref 39.0–52.0)
Hemoglobin: 10.8 g/dL — ABNORMAL LOW (ref 13.0–17.0)
Immature Granulocytes: 0 %
Lymphocytes Relative: 24 %
Lymphs Abs: 1.6 10*3/uL (ref 0.7–4.0)
MCH: 29.2 pg (ref 26.0–34.0)
MCHC: 33 g/dL (ref 30.0–36.0)
MCV: 88.4 fL (ref 80.0–100.0)
Monocytes Absolute: 0.4 10*3/uL (ref 0.1–1.0)
Monocytes Relative: 6 %
Neutro Abs: 4.7 10*3/uL (ref 1.7–7.7)
Neutrophils Relative %: 68 %
Platelet Count: 286 10*3/uL (ref 150–400)
RBC: 3.7 MIL/uL — ABNORMAL LOW (ref 4.22–5.81)
RDW: 16.5 % — ABNORMAL HIGH (ref 11.5–15.5)
WBC Count: 6.9 10*3/uL (ref 4.0–10.5)
nRBC: 0 % (ref 0.0–0.2)

## 2019-11-26 LAB — CMP (CANCER CENTER ONLY)
ALT: 14 U/L (ref 0–44)
AST: 16 U/L (ref 15–41)
Albumin: 3.4 g/dL — ABNORMAL LOW (ref 3.5–5.0)
Alkaline Phosphatase: 60 U/L (ref 38–126)
Anion gap: 10 (ref 5–15)
BUN: 26 mg/dL — ABNORMAL HIGH (ref 8–23)
CO2: 26 mmol/L (ref 22–32)
Calcium: 9.5 mg/dL (ref 8.9–10.3)
Chloride: 103 mmol/L (ref 98–111)
Creatinine: 1.99 mg/dL — ABNORMAL HIGH (ref 0.61–1.24)
GFR, Est AFR Am: 37 mL/min — ABNORMAL LOW (ref >60)
GFR, Estimated: 32 mL/min — ABNORMAL LOW (ref >60)
Glucose, Bld: 210 mg/dL — ABNORMAL HIGH (ref 70–99)
Potassium: 3.9 mmol/L (ref 3.5–5.1)
Sodium: 139 mmol/L (ref 135–145)
Total Bilirubin: 0.4 mg/dL (ref 0.3–1.2)
Total Protein: 7.6 g/dL (ref 6.5–8.1)

## 2019-11-27 ENCOUNTER — Other Ambulatory Visit: Payer: Self-pay | Admitting: Pharmacist

## 2019-11-27 ENCOUNTER — Other Ambulatory Visit: Payer: Self-pay | Admitting: Medical Oncology

## 2019-11-27 DIAGNOSIS — C7A8 Other malignant neuroendocrine tumors: Secondary | ICD-10-CM

## 2019-11-27 DIAGNOSIS — R112 Nausea with vomiting, unspecified: Secondary | ICD-10-CM

## 2019-11-27 MED ORDER — CAPECITABINE 500 MG PO TABS: ORAL_TABLET | ORAL | 2 refills | Status: DC

## 2019-11-27 MED ORDER — ONDANSETRON HCL 8 MG PO TABS: 8.0000 mg | ORAL_TABLET | Freq: Three times a day (TID) | ORAL | 0 refills | Status: DC | PRN

## 2019-11-27 NOTE — Progress Notes (Signed)
Oral Chemotherapy Pharmacist Encounter   For the first cycle provider sent prescriptions for Temodar, Xeloda, and ondansetron to [facility]. [facility] filled the Xeloda but had trouble filling the Temodar. [facility] was able to fill the Temodar.   For the second cycle the patient would like to use [facility] for all 3 medications to simplify things. Remaining refills on the Xeloda prescriptions were sent to [facility].  Darl Pikes, PharmD, BCPS, BCOP, CPP Hematology/Oncology Clinical Pharmacist [facility] [phone]  11/27/2019 12:36 PM

## 2019-12-01 ENCOUNTER — Encounter: Payer: Self-pay | Admitting: Family Medicine

## 2019-12-01 ENCOUNTER — Ambulatory Visit (INDEPENDENT_AMBULATORY_CARE_PROVIDER_SITE_OTHER): Payer: Medicare Other | Admitting: Family Medicine

## 2019-12-01 ENCOUNTER — Other Ambulatory Visit: Payer: Self-pay

## 2019-12-01 VITALS — BP 134/68 | HR 63 | Temp 98.9°F | Ht 75.0 in | Wt 167.0 lb

## 2019-12-01 DIAGNOSIS — E1159 Type 2 diabetes mellitus with other circulatory complications: Secondary | ICD-10-CM | POA: Diagnosis not present

## 2019-12-01 DIAGNOSIS — E1121 Type 2 diabetes mellitus with diabetic nephropathy: Secondary | ICD-10-CM

## 2019-12-01 DIAGNOSIS — N1832 Chronic kidney disease, stage 3b: Secondary | ICD-10-CM | POA: Diagnosis not present

## 2019-12-01 DIAGNOSIS — I10 Essential (primary) hypertension: Secondary | ICD-10-CM | POA: Diagnosis not present

## 2019-12-01 DIAGNOSIS — K5900 Constipation, unspecified: Secondary | ICD-10-CM

## 2019-12-01 DIAGNOSIS — I152 Hypertension secondary to endocrine disorders: Secondary | ICD-10-CM

## 2019-12-01 DIAGNOSIS — E785 Hyperlipidemia, unspecified: Secondary | ICD-10-CM

## 2019-12-01 DIAGNOSIS — E1122 Type 2 diabetes mellitus with diabetic chronic kidney disease: Secondary | ICD-10-CM

## 2019-12-01 MED ORDER — GLIPIZIDE 10 MG PO TABS: 10.0000 mg | ORAL_TABLET | Freq: Every day | ORAL | 3 refills | Status: DC

## 2019-12-01 MED FILL — CAPECITABINE 500 MG TABLET: 500 | 14 days supply | Qty: 84 | Fill #0

## 2019-12-01 NOTE — Progress Notes (Signed)
Phone [phone] In person visit   Subjective:   Richard Navarro is a 74 y.o. year old very pleasant male patient who presents for/with See problem oriented charting Chief Complaint  Patient presents with  . Diabetes   This visit occurred during the SARS-CoV-2 public health emergency.  Safety protocols were in place, including screening questions prior to the visit, additional usage of staff PPE, and extensive cleaning of exam room while observing appropriate contact time as indicated for disinfecting solutions.   Past Medical History-  Patient Active Problem List   Diagnosis Date Noted  . Anemia 05/07/2018    Priority: High  . CKD (chronic kidney disease), stage III 03/04/2018    Priority: High  . Neuroendocrine carcinoma ([place]) 08/09/2017    Priority: High  . Primary cancer of right lung ([place]) 12/22/2016    Priority: High  . Diabetes mellitus with renal manifestation (HCC)     Priority: High  . Myalgia due to statin 03/04/2018    Priority: Medium  . COPD (chronic obstructive pulmonary disease) ([place]) 12/01/2016    Priority: Medium  . PTSD (post-traumatic stress disorder) 12/07/2015    Priority: Medium  . Erectile dysfunction 04/09/2014    Priority: Medium  . Former smoker 03/26/2014    Priority: Medium  . Hypertension associated with diabetes ([place])     Priority: Medium  . Gout     Priority: Medium  . Depression     Priority: Medium  . Hyperlipidemia     Priority: Medium  . Thrombocytopenia ([place])     Priority: Low  . Pneumonia 08/09/2017    Priority: Low  . Encounter for antineoplastic chemotherapy 01/10/2017    Priority: Low  . Goals of care, counseling/discussion 01/10/2017    Priority: Low  . Prostate cancer screening 04/17/2014    Priority: Low  . Arthritis 03/26/2014    Priority: Low  . History of adenomatous polyp of colon 03/26/2014    Priority: Low  . GERD (gastroesophageal reflux disease)     Priority: Low  . Recurrent major depressive  disorder, in full remission ([place]) 10/21/2019    Medications- reviewed and updated Current Outpatient Medications  Medication Sig Dispense Refill  . albuterol (VENTOLIN HFA) 108 (90 Base) MCG/ACT inhaler Inhale 2 puffs into the lungs every 4 (four) hours as needed for wheezing or shortness of breath. 3 Inhaler 2  . allopurinol (ZYLOPRIM) 100 MG tablet TAKE 1 TABLET DAILY 90 tablet 3  . amLODipine (NORVASC) 10 MG tablet Take 1 tablet (10 mg total) by mouth daily. 90 tablet 3  . Calcium Carbonate Antacid (CALCIUM CARBONATE, DOSED IN MG ELEMENTAL CALCIUM,) 1250 MG/5ML SUSP Take 5 mLs (500 mg of elemental calcium total) by mouth every 6 (six) hours as needed for indigestion. 450 mL   . capecitabine (XELODA) 500 MG tablet 3 tablet p.o. twice daily for 14 days every 4 weeks. 84 tablet 2  . Cholecalciferol (VITAMIN D3) 10 MCG/ML LIQD Take by mouth.    . Continuous Blood Gluc Sensor (FREESTYLE LIBRE 14 DAY SENSOR) MISC USE TO CHECK BLOOD SUGAR AS DIRECTED AND CHANGE EVERY 14 DAYS 2 each 5  . diclofenac sodium (VOLTAREN) 1 % GEL APPLY 2 GRAMS TOPICALLY FOUR TIMES A DAY AS NEEDED FOR LEFT SHOULDER ARTHRITIS (Patient taking differently: Apply 2 g topically 4 (four) times daily as needed. FOR LEFT SHOULDER ARTHRITIS) 300 g 3  . Ferrous Sulfate (IRON) 325 (65 Fe) MG TABS Take 1 tablet by mouth every other day.     Marland Kitchen  glipiZIDE (GLUCOTROL) 10 MG tablet Take 1 tablet (10 mg total) by mouth daily before breakfast. 90 tablet 3  . glucose blood test strip Use to test blood sugar twice a day 200 each 3  . guaiFENesin (MUCINEX) 600 MG 12 hr tablet Take 600 mg by mouth 2 (two) times daily.     . metoprolol tartrate (LOPRESSOR) 25 MG tablet Take 0.5 tablets (12.5 mg total) by mouth 2 (two) times daily. 90 tablet 3  . ondansetron (ZOFRAN) 8 MG tablet Take 1 tablet (8 mg total) by mouth every 8 (eight) hours as needed for nausea or vomiting. 30 tablet 0  . pantoprazole (PROTONIX) 40 MG tablet Take 1 tablet (40 mg total)  by mouth daily. 90 tablet 3  . SPIRIVA RESPIMAT 2.5 MCG/ACT AERS USE 2 INHALATIONS DAILY 12 g 3  . temozolomide (TEMODAR) 100 MG capsule Four capsule p.o. daily from days 10,11,12,13 and 14 every 4 weeks.  May take on an empty stomach or at bedtime to decrease nausea & vomiting. 20 capsule 3  . vitamin B-12 (CYANOCOBALAMIN) 1000 MCG tablet Take 1,000 mcg by mouth daily.     . vitamin C (ASCORBIC ACID) 500 MG tablet Take 500 mg by mouth daily.     No current facility-administered medications for this visit.     Objective:  BP 134/68   Pulse 63   Temp 98.9 F (37.2 C) (Temporal)   Ht 6\' 3"  (1.905 m)   Wt 167 lb (75.8 kg)   SpO2 98%   BMI 20.87 kg/m  Gen: NAD, resting comfortably CV: RRR no murmurs rubs or gallops Lungs: CTAB no crackles, wheeze, rhonchi Abdomen: soft/nontender/nondistended/normal bowel sounds.  Ext: no edema Skin: warm, dry     Assessment and Plan     #Chronic kidney disease stage III S: Patient with significant worsening in kidney function dating back to November 2020when patient was on Afinitor previously-had worsening renal function in light of Afinitor as well as prior contrast studies most likely. Later patient restarted on afinitor but had worsening renal function (mild)  and disease progression.  He is following along with [facility] A/P:  GFR had been down to around 1.7 but then worsened by 10/29/19 to 1.98 and later 2.17- has trended back down a few days ago to 1.99 with GFR in 30s and has close follow up with labs at cancer center.   -thankful BP controlled- continue current meds.   #Diabetes mellitus #Lung cancer treatment/side effects of medication #Constipation S: No Rx as of 06/23/2019.  Patient was restarted on Afinitor and blood sugars trended up.  His last dose of Afinitor was taking October 29, 2019 and he was changed to Xeloda and Temodar as well as Zofran for nausea.  He did have some issues with this change requiring IV fluids on November 19 2019 and has required transfusion of blood with this transition but he is doing better lately.    Once he was taken off of Afinitor he noted his blood sugar slowly started to drop and by March 24 his evening blood sugar was in the low 70s so they stopped the Metformin nighttime dosage but continued Metformin ~50 mg in the morning and glipizide 10 mg twice a day.  Later on March 31 patient had another blood sugar down to 59 and they opted to stop Metformin completely but continued glipizide.  Patient also has had issues with constipation since this change.  He is taking Colace, Dulcolax, Benefiber, mineral oil, MiraLAX  and finally had some success on October 30, 2019 with fleets enema with mineral oil. A/P: For diabetes after this visit patient will be on glipizide in the morning only-main issues with low blood sugars have been in the evenings.  If continued issues with blood sugars dropping could stop glipizide and place him back on Metformin but would need to be careful with GFR in the 30s "Stop metformin completely Stop glipizide before bed If you have any more sugars under 70 call me"  Constipation (appears temodar causing) -recommended MiraLAX 1 capful daily along with at least 60 ounces of water a day-recommended he set a timer to help him remember to drink fluids.  If he has loose stools can hold for a day and then restart half capful-actually patient uses packages right now so would be half a package full.  #Hypertension associated with diabetes S: Compliant with amlodipine 10 mg and metoprolol 12.5 mg twice daily BP Readings from Last 3 Encounters:  12/01/19 134/68  11/21/19 132/69  11/19/19 134/62  A/P: Good control-continue current medications.  Patient with less edema issues off Afinitor it seems even while still on amlodipine  Recommended follow up: Return in about 1 month (around 12/31/2019) for follow up- or sooner if needed. Future Appointments  Date Time Provider [place]  12/04/2019  2:30 PM [facility] 1 [facility] None  12/04/2019  3:00 PM Heilingoetter, Cassandra L, PA-C [facility] None  12/10/2019  2:00 PM [facility] 1 [facility] None  01/05/2020  3:00 PM [name], MD [facility]  01/14/2020  9:45 AM Marzetta Board, DPM [facility]    Lab/Order associations:   ICD-10-CM   1. Type 2 diabetes mellitus with stage 3b chronic kidney disease, without long-term current use of insulin (HCC)  E11.21    N18.32   2. Hypertension associated with diabetes ([place])  E11.59    I10   3. Constipation, unspecified constipation type  K59.00     Meds ordered this encounter  Medications  . glipiZIDE (GLUCOTROL) 10 MG tablet    Sig: Take 1 tablet (10 mg total) by mouth daily before breakfast.    Dispense:  90 tablet    Refill:  3   Return precautions advised.  [name], MD

## 2019-12-01 NOTE — Patient Instructions (Addendum)
Stop metformin completely Stop glipizide before bed If you have any more sugars under 70 call me  Miralax capful/package full daily with one of your glasses of water. Hold if you get loose stool for 1 day then resume half capful/packageful  Lets have you drink 8 eight oz glasses of water a day- set a timer to remind yourself.   Recommended follow up: Return in about 1 month (around 12/31/2019) for follow up- or sooner if needed.
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
## 2019-12-04 ENCOUNTER — Inpatient Hospital Stay (HOSPITAL_BASED_OUTPATIENT_CLINIC_OR_DEPARTMENT_OTHER): Payer: Medicare Other | Admitting: Physician Assistant

## 2019-12-04 ENCOUNTER — Inpatient Hospital Stay: Payer: Medicare Other

## 2019-12-04 ENCOUNTER — Other Ambulatory Visit: Payer: Self-pay

## 2019-12-04 ENCOUNTER — Encounter: Payer: Self-pay | Admitting: Physician Assistant

## 2019-12-04 VITALS — BP 167/85 | HR 72 | Temp 98.5°F | Resp 18 | Ht 75.0 in | Wt 170.2 lb

## 2019-12-04 DIAGNOSIS — C7B02 Secondary carcinoid tumors of liver: Secondary | ICD-10-CM | POA: Diagnosis not present

## 2019-12-04 DIAGNOSIS — C7A8 Other malignant neuroendocrine tumors: Secondary | ICD-10-CM

## 2019-12-04 DIAGNOSIS — C7A090 Malignant carcinoid tumor of the bronchus and lung: Secondary | ICD-10-CM | POA: Diagnosis not present

## 2019-12-04 DIAGNOSIS — E119 Type 2 diabetes mellitus without complications: Secondary | ICD-10-CM | POA: Diagnosis not present

## 2019-12-04 DIAGNOSIS — D630 Anemia in neoplastic disease: Secondary | ICD-10-CM | POA: Diagnosis not present

## 2019-12-04 DIAGNOSIS — N189 Chronic kidney disease, unspecified: Secondary | ICD-10-CM | POA: Diagnosis not present

## 2019-12-04 DIAGNOSIS — Z79899 Other long term (current) drug therapy: Secondary | ICD-10-CM | POA: Diagnosis not present

## 2019-12-04 LAB — CBC WITH DIFFERENTIAL (CANCER CENTER ONLY)
Abs Immature Granulocytes: 0.01 10*3/uL (ref 0.00–0.07)
Basophils Absolute: 0 10*3/uL (ref 0.0–0.1)
Basophils Relative: 0 %
Eosinophils Absolute: 0.1 10*3/uL (ref 0.0–0.5)
Eosinophils Relative: 1 %
HCT: 33.8 % — ABNORMAL LOW (ref 39.0–52.0)
Hemoglobin: 11 g/dL — ABNORMAL LOW (ref 13.0–17.0)
Immature Granulocytes: 0 %
Lymphocytes Relative: 26 %
Lymphs Abs: 1.8 10*3/uL (ref 0.7–4.0)
MCH: 29.5 pg (ref 26.0–34.0)
MCHC: 32.5 g/dL (ref 30.0–36.0)
MCV: 90.6 fL (ref 80.0–100.0)
Monocytes Absolute: 0.7 10*3/uL (ref 0.1–1.0)
Monocytes Relative: 9 %
Neutro Abs: 4.5 10*3/uL (ref 1.7–7.7)
Neutrophils Relative %: 64 %
Platelet Count: 250 10*3/uL (ref 150–400)
RBC: 3.73 MIL/uL — ABNORMAL LOW (ref 4.22–5.81)
RDW: 17.8 % — ABNORMAL HIGH (ref 11.5–15.5)
WBC Count: 7.1 10*3/uL (ref 4.0–10.5)
nRBC: 0 % (ref 0.0–0.2)

## 2019-12-04 LAB — CMP (CANCER CENTER ONLY)
ALT: 8 U/L (ref 0–44)
AST: 10 U/L — ABNORMAL LOW (ref 15–41)
Albumin: 3.6 g/dL (ref 3.5–5.0)
Alkaline Phosphatase: 65 U/L (ref 38–126)
Anion gap: 10 (ref 5–15)
BUN: 21 mg/dL (ref 8–23)
CO2: 26 mmol/L (ref 22–32)
Calcium: 10.3 mg/dL (ref 8.9–10.3)
Chloride: 105 mmol/L (ref 98–111)
Creatinine: 1.81 mg/dL — ABNORMAL HIGH (ref 0.61–1.24)
GFR, Est AFR Am: 41 mL/min — ABNORMAL LOW (ref >60)
GFR, Estimated: 36 mL/min — ABNORMAL LOW (ref >60)
Glucose, Bld: 159 mg/dL — ABNORMAL HIGH (ref 70–99)
Potassium: 4 mmol/L (ref 3.5–5.1)
Sodium: 141 mmol/L (ref 135–145)
Total Bilirubin: 0.3 mg/dL (ref 0.3–1.2)
Total Protein: 8 g/dL (ref 6.5–8.1)

## 2019-12-09 ENCOUNTER — Other Ambulatory Visit: Payer: Self-pay | Admitting: Medical Oncology

## 2019-12-10 ENCOUNTER — Other Ambulatory Visit: Payer: Self-pay

## 2019-12-10 ENCOUNTER — Inpatient Hospital Stay: Payer: Medicare Other

## 2019-12-10 DIAGNOSIS — E119 Type 2 diabetes mellitus without complications: Secondary | ICD-10-CM | POA: Diagnosis not present

## 2019-12-10 DIAGNOSIS — D630 Anemia in neoplastic disease: Secondary | ICD-10-CM | POA: Diagnosis not present

## 2019-12-10 DIAGNOSIS — N189 Chronic kidney disease, unspecified: Secondary | ICD-10-CM | POA: Diagnosis not present

## 2019-12-10 DIAGNOSIS — C7A8 Other malignant neuroendocrine tumors: Secondary | ICD-10-CM

## 2019-12-10 DIAGNOSIS — Z79899 Other long term (current) drug therapy: Secondary | ICD-10-CM | POA: Diagnosis not present

## 2019-12-10 DIAGNOSIS — C7B02 Secondary carcinoid tumors of liver: Secondary | ICD-10-CM | POA: Diagnosis not present

## 2019-12-10 DIAGNOSIS — C7A090 Malignant carcinoid tumor of the bronchus and lung: Secondary | ICD-10-CM | POA: Diagnosis not present

## 2019-12-10 LAB — CBC WITH DIFFERENTIAL (CANCER CENTER ONLY)
Abs Immature Granulocytes: 0.02 10*3/uL (ref 0.00–0.07)
Basophils Absolute: 0 10*3/uL (ref 0.0–0.1)
Basophils Relative: 1 %
Eosinophils Absolute: 0.1 10*3/uL (ref 0.0–0.5)
Eosinophils Relative: 1 %
HCT: 34.2 % — ABNORMAL LOW (ref 39.0–52.0)
Hemoglobin: 11.1 g/dL — ABNORMAL LOW (ref 13.0–17.0)
Immature Granulocytes: 0 %
Lymphocytes Relative: 34 %
Lymphs Abs: 2.2 10*3/uL (ref 0.7–4.0)
MCH: 29.4 pg (ref 26.0–34.0)
MCHC: 32.5 g/dL (ref 30.0–36.0)
MCV: 90.7 fL (ref 80.0–100.0)
Monocytes Absolute: 0.5 10*3/uL (ref 0.1–1.0)
Monocytes Relative: 7 %
Neutro Abs: 3.7 10*3/uL (ref 1.7–7.7)
Neutrophils Relative %: 57 %
Platelet Count: 206 10*3/uL (ref 150–400)
RBC: 3.77 MIL/uL — ABNORMAL LOW (ref 4.22–5.81)
RDW: 18.1 % — ABNORMAL HIGH (ref 11.5–15.5)
WBC Count: 6.5 10*3/uL (ref 4.0–10.5)
nRBC: 0 % (ref 0.0–0.2)

## 2019-12-10 LAB — CMP (CANCER CENTER ONLY)
ALT: 9 U/L (ref 0–44)
AST: 14 U/L — ABNORMAL LOW (ref 15–41)
Albumin: 3.5 g/dL (ref 3.5–5.0)
Alkaline Phosphatase: 65 U/L (ref 38–126)
Anion gap: 11 (ref 5–15)
BUN: 26 mg/dL — ABNORMAL HIGH (ref 8–23)
CO2: 28 mmol/L (ref 22–32)
Calcium: 9.7 mg/dL (ref 8.9–10.3)
Chloride: 103 mmol/L (ref 98–111)
Creatinine: 1.63 mg/dL — ABNORMAL HIGH (ref 0.61–1.24)
GFR, Est AFR Am: 47 mL/min — ABNORMAL LOW (ref >60)
GFR, Estimated: 41 mL/min — ABNORMAL LOW (ref >60)
Glucose, Bld: 97 mg/dL (ref 70–99)
Potassium: 3.8 mmol/L (ref 3.5–5.1)
Sodium: 142 mmol/L (ref 135–145)
Total Bilirubin: 0.3 mg/dL (ref 0.3–1.2)
Total Protein: 7.9 g/dL (ref 6.5–8.1)

## 2019-12-15 ENCOUNTER — Inpatient Hospital Stay: Payer: Medicare Other

## 2019-12-15 ENCOUNTER — Other Ambulatory Visit: Payer: Self-pay

## 2019-12-15 DIAGNOSIS — D630 Anemia in neoplastic disease: Secondary | ICD-10-CM | POA: Diagnosis not present

## 2019-12-15 DIAGNOSIS — E119 Type 2 diabetes mellitus without complications: Secondary | ICD-10-CM | POA: Diagnosis not present

## 2019-12-15 DIAGNOSIS — Z79899 Other long term (current) drug therapy: Secondary | ICD-10-CM | POA: Diagnosis not present

## 2019-12-15 DIAGNOSIS — C7B02 Secondary carcinoid tumors of liver: Secondary | ICD-10-CM | POA: Diagnosis not present

## 2019-12-15 DIAGNOSIS — C7A090 Malignant carcinoid tumor of the bronchus and lung: Secondary | ICD-10-CM | POA: Diagnosis not present

## 2019-12-15 DIAGNOSIS — N189 Chronic kidney disease, unspecified: Secondary | ICD-10-CM | POA: Diagnosis not present

## 2019-12-15 DIAGNOSIS — C7A8 Other malignant neuroendocrine tumors: Secondary | ICD-10-CM

## 2019-12-15 LAB — CBC WITH DIFFERENTIAL (CANCER CENTER ONLY)
Abs Immature Granulocytes: 0.02 10*3/uL (ref 0.00–0.07)
Basophils Absolute: 0 10*3/uL (ref 0.0–0.1)
Basophils Relative: 1 %
Eosinophils Absolute: 0.1 10*3/uL (ref 0.0–0.5)
Eosinophils Relative: 1 %
HCT: 32.1 % — ABNORMAL LOW (ref 39.0–52.0)
Hemoglobin: 10.7 g/dL — ABNORMAL LOW (ref 13.0–17.0)
Immature Granulocytes: 0 %
Lymphocytes Relative: 24 %
Lymphs Abs: 1.6 10*3/uL (ref 0.7–4.0)
MCH: 30 pg (ref 26.0–34.0)
MCHC: 33.3 g/dL (ref 30.0–36.0)
MCV: 89.9 fL (ref 80.0–100.0)
Monocytes Absolute: 0.4 10*3/uL (ref 0.1–1.0)
Monocytes Relative: 6 %
Neutro Abs: 4.5 10*3/uL (ref 1.7–7.7)
Neutrophils Relative %: 68 %
Platelet Count: 152 10*3/uL (ref 150–400)
RBC: 3.57 MIL/uL — ABNORMAL LOW (ref 4.22–5.81)
RDW: 17.4 % — ABNORMAL HIGH (ref 11.5–15.5)
WBC Count: 6.7 10*3/uL (ref 4.0–10.5)
nRBC: 0 % (ref 0.0–0.2)

## 2019-12-15 LAB — CMP (CANCER CENTER ONLY)
ALT: 10 U/L (ref 0–44)
AST: 13 U/L — ABNORMAL LOW (ref 15–41)
Albumin: 3.5 g/dL (ref 3.5–5.0)
Alkaline Phosphatase: 61 U/L (ref 38–126)
Anion gap: 10 (ref 5–15)
BUN: 25 mg/dL — ABNORMAL HIGH (ref 8–23)
CO2: 27 mmol/L (ref 22–32)
Calcium: 9 mg/dL (ref 8.9–10.3)
Chloride: 104 mmol/L (ref 98–111)
Creatinine: 1.8 mg/dL — ABNORMAL HIGH (ref 0.61–1.24)
GFR, Est AFR Am: 42 mL/min — ABNORMAL LOW (ref >60)
GFR, Estimated: 36 mL/min — ABNORMAL LOW (ref >60)
Glucose, Bld: 182 mg/dL — ABNORMAL HIGH (ref 70–99)
Potassium: 3.5 mmol/L (ref 3.5–5.1)
Sodium: 141 mmol/L (ref 135–145)
Total Bilirubin: 0.5 mg/dL (ref 0.3–1.2)
Total Protein: 7.5 g/dL (ref 6.5–8.1)
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
## 2020-01-05 ENCOUNTER — Telehealth: Payer: Self-pay | Admitting: Internal Medicine

## 2020-01-05 ENCOUNTER — Ambulatory Visit (INDEPENDENT_AMBULATORY_CARE_PROVIDER_SITE_OTHER): Payer: Medicare Other | Admitting: Family Medicine

## 2020-01-05 ENCOUNTER — Encounter: Payer: Self-pay | Admitting: Physician Assistant

## 2020-01-05 ENCOUNTER — Other Ambulatory Visit: Payer: Self-pay

## 2020-01-05 ENCOUNTER — Inpatient Hospital Stay (HOSPITAL_BASED_OUTPATIENT_CLINIC_OR_DEPARTMENT_OTHER): Payer: Medicare Other | Admitting: Physician Assistant

## 2020-01-05 ENCOUNTER — Encounter: Payer: Self-pay | Admitting: Family Medicine

## 2020-01-05 ENCOUNTER — Inpatient Hospital Stay: Payer: Medicare Other | Attending: Internal Medicine

## 2020-01-05 VITALS — BP 138/78 | HR 67 | Temp 98.3°F | Ht 75.0 in | Wt 180.0 lb

## 2020-01-05 VITALS — BP 163/76 | HR 63 | Temp 98.2°F | Resp 17 | Ht 75.0 in | Wt 177.3 lb

## 2020-01-05 DIAGNOSIS — C7A090 Malignant carcinoid tumor of the bronchus and lung: Secondary | ICD-10-CM | POA: Diagnosis not present

## 2020-01-05 DIAGNOSIS — N1832 Chronic kidney disease, stage 3b: Secondary | ICD-10-CM | POA: Diagnosis not present

## 2020-01-05 DIAGNOSIS — E1159 Type 2 diabetes mellitus with other circulatory complications: Secondary | ICD-10-CM

## 2020-01-05 DIAGNOSIS — Z79899 Other long term (current) drug therapy: Secondary | ICD-10-CM | POA: Insufficient documentation

## 2020-01-05 DIAGNOSIS — C7B02 Secondary carcinoid tumors of liver: Secondary | ICD-10-CM | POA: Diagnosis not present

## 2020-01-05 DIAGNOSIS — E1121 Type 2 diabetes mellitus with diabetic nephropathy: Secondary | ICD-10-CM

## 2020-01-05 DIAGNOSIS — K219 Gastro-esophageal reflux disease without esophagitis: Secondary | ICD-10-CM | POA: Insufficient documentation

## 2020-01-05 DIAGNOSIS — C7A8 Other malignant neuroendocrine tumors: Secondary | ICD-10-CM

## 2020-01-05 DIAGNOSIS — E119 Type 2 diabetes mellitus without complications: Secondary | ICD-10-CM | POA: Insufficient documentation

## 2020-01-05 DIAGNOSIS — Z7984 Long term (current) use of oral hypoglycemic drugs: Secondary | ICD-10-CM | POA: Diagnosis not present

## 2020-01-05 DIAGNOSIS — I152 Hypertension secondary to endocrine disorders: Secondary | ICD-10-CM

## 2020-01-05 DIAGNOSIS — I10 Essential (primary) hypertension: Secondary | ICD-10-CM | POA: Diagnosis not present

## 2020-01-05 DIAGNOSIS — K5900 Constipation, unspecified: Secondary | ICD-10-CM | POA: Insufficient documentation

## 2020-01-05 DIAGNOSIS — M1A09X0 Idiopathic chronic gout, multiple sites, without tophus (tophi): Secondary | ICD-10-CM | POA: Diagnosis not present

## 2020-01-05 DIAGNOSIS — N189 Chronic kidney disease, unspecified: Secondary | ICD-10-CM | POA: Insufficient documentation

## 2020-01-05 DIAGNOSIS — E785 Hyperlipidemia, unspecified: Secondary | ICD-10-CM | POA: Diagnosis not present

## 2020-01-05 DIAGNOSIS — J439 Emphysema, unspecified: Secondary | ICD-10-CM | POA: Insufficient documentation

## 2020-01-05 DIAGNOSIS — Z791 Long term (current) use of non-steroidal anti-inflammatories (NSAID): Secondary | ICD-10-CM | POA: Insufficient documentation

## 2020-01-05 DIAGNOSIS — E1122 Type 2 diabetes mellitus with diabetic chronic kidney disease: Secondary | ICD-10-CM

## 2020-01-05 LAB — CMP (CANCER CENTER ONLY)
ALT: 13 U/L (ref 0–44)
AST: 14 U/L — ABNORMAL LOW (ref 15–41)
Albumin: 3.7 g/dL (ref 3.5–5.0)
Alkaline Phosphatase: 63 U/L (ref 38–126)
Anion gap: 10 (ref 5–15)
BUN: 19 mg/dL (ref 8–23)
CO2: 26 mmol/L (ref 22–32)
Calcium: 8.9 mg/dL (ref 8.9–10.3)
Chloride: 104 mmol/L (ref 98–111)
Creatinine: 1.77 mg/dL — ABNORMAL HIGH (ref 0.61–1.24)
GFR, Est AFR Am: 43 mL/min — ABNORMAL LOW
GFR, Estimated: 37 mL/min — ABNORMAL LOW
Glucose, Bld: 129 mg/dL — ABNORMAL HIGH (ref 70–99)
Potassium: 3.5 mmol/L (ref 3.5–5.1)
Sodium: 140 mmol/L (ref 135–145)
Total Bilirubin: 0.3 mg/dL (ref 0.3–1.2)
Total Protein: 7.6 g/dL (ref 6.5–8.1)

## 2020-01-05 LAB — CBC WITH DIFFERENTIAL (CANCER CENTER ONLY)
Abs Immature Granulocytes: 0.01 10*3/uL (ref 0.00–0.07)
Basophils Absolute: 0 10*3/uL (ref 0.0–0.1)
Basophils Relative: 0 %
Eosinophils Absolute: 0.1 10*3/uL (ref 0.0–0.5)
Eosinophils Relative: 1 %
HCT: 34.3 % — ABNORMAL LOW (ref 39.0–52.0)
Hemoglobin: 11.2 g/dL — ABNORMAL LOW (ref 13.0–17.0)
Immature Granulocytes: 0 %
Lymphocytes Relative: 33 %
Lymphs Abs: 1.9 10*3/uL (ref 0.7–4.0)
MCH: 30.4 pg (ref 26.0–34.0)
MCHC: 32.7 g/dL (ref 30.0–36.0)
MCV: 93.2 fL (ref 80.0–100.0)
Monocytes Absolute: 0.5 10*3/uL (ref 0.1–1.0)
Monocytes Relative: 9 %
Neutro Abs: 3.3 10*3/uL (ref 1.7–7.7)
Neutrophils Relative %: 57 %
Platelet Count: 220 10*3/uL (ref 150–400)
RBC: 3.68 MIL/uL — ABNORMAL LOW (ref 4.22–5.81)
RDW: 20.3 % — ABNORMAL HIGH (ref 11.5–15.5)
WBC Count: 5.9 10*3/uL (ref 4.0–10.5)
nRBC: 0 % (ref 0.0–0.2)

## 2020-01-05 MED ORDER — GLIPIZIDE 5 MG PO TABS: 5.0000 mg | ORAL_TABLET | Freq: Every day | ORAL | 3 refills | Status: DC

## 2020-01-05 MED ORDER — PANTOPRAZOLE SODIUM 40 MG PO TBEC: 40.0000 mg | DELAYED_RELEASE_TABLET | Freq: Every day | ORAL | 3 refills | Status: DC

## 2020-01-05 NOTE — Progress Notes (Signed)
Phone [phone] In person visit   Subjective:   Richard Navarro is a 74 y.o. year old very pleasant male patient who presents for/with See problem oriented charting Chief Complaint  Patient presents with  . Diabetes   This visit occurred during the SARS-CoV-2 public health emergency.  Safety protocols were in place, including screening questions prior to the visit, additional usage of staff PPE, and extensive cleaning of exam room while observing appropriate contact time as indicated for disinfecting solutions.   Past Medical History-  Patient Active Problem List   Diagnosis Date Noted  . Anemia 05/07/2018    Priority: High  . CKD (chronic kidney disease), stage III 03/04/2018    Priority: High  . Neuroendocrine carcinoma ([place]) 08/09/2017    Priority: High  . Primary cancer of right lung ([place]) 12/22/2016    Priority: High  . Diabetes mellitus with renal manifestation ([facility])     Priority: High  . Myalgia due to statin 03/04/2018    Priority: Medium  . COPD (chronic obstructive pulmonary disease) ([place]) 12/01/2016    Priority: Medium  . PTSD (post-traumatic stress disorder) 12/07/2015    Priority: Medium  . Erectile dysfunction 04/09/2014    Priority: Medium  . Former smoker 03/26/2014    Priority: Medium  . Hypertension associated with diabetes ([place])     Priority: Medium  . Gout     Priority: Medium  . Depression     Priority: Medium  . Hyperlipidemia     Priority: Medium  . Thrombocytopenia ([place])     Priority: Low  . Pneumonia 08/09/2017    Priority: Low  . Encounter for antineoplastic chemotherapy 01/10/2017    Priority: Low  . Goals of care, counseling/discussion 01/10/2017    Priority: Low  . Prostate cancer screening 04/17/2014    Priority: Low  . Arthritis 03/26/2014    Priority: Low  . History of adenomatous polyp of colon 03/26/2014    Priority: Low  . GERD (gastroesophageal reflux disease)     Priority: Low  . Recurrent major depressive  disorder, in full remission ([place]) 10/21/2019    Medications- reviewed and updated Current Outpatient Medications  Medication Sig Dispense Refill  . albuterol (VENTOLIN HFA) 108 (90 Base) MCG/ACT inhaler Inhale 2 puffs into the lungs every 4 (four) hours as needed for wheezing or shortness of breath. 3 Inhaler 2  . allopurinol (ZYLOPRIM) 100 MG tablet TAKE 1 TABLET DAILY 90 tablet 3  . amLODipine (NORVASC) 10 MG tablet Take 1 tablet (10 mg total) by mouth daily. 90 tablet 3  . Calcium Carbonate Antacid (CALCIUM CARBONATE, DOSED IN MG ELEMENTAL CALCIUM,) 1250 MG/5ML SUSP Take 5 mLs (500 mg of elemental calcium total) by mouth every 6 (six) hours as needed for indigestion. 450 mL   . capecitabine (XELODA) 500 MG tablet 3 tablet p.o. twice daily for 14 days every 4 weeks. 84 tablet 2  . Cholecalciferol (VITAMIN D3) 10 MCG/ML LIQD Take by mouth.    . Continuous Blood Gluc Sensor (FREESTYLE LIBRE 14 DAY SENSOR) MISC USE TO CHECK BLOOD SUGAR AS DIRECTED AND CHANGE EVERY 14 DAYS 2 each 5  . diclofenac sodium (VOLTAREN) 1 % GEL APPLY 2 GRAMS TOPICALLY FOUR TIMES A DAY AS NEEDED FOR LEFT SHOULDER ARTHRITIS (Patient taking differently: Apply 2 g topically 4 (four) times daily as needed. FOR LEFT SHOULDER ARTHRITIS) 300 g 3  . Ferrous Sulfate (IRON) 325 (65 Fe) MG TABS Take 1 tablet by mouth every other day.     Marland Kitchen  glipiZIDE (GLUCOTROL) 5 MG tablet Take 1 tablet (5 mg total) by mouth daily before breakfast. 90 tablet 3  . glucose blood test strip Use to test blood sugar twice a day 200 each 3  . guaiFENesin (MUCINEX) 600 MG 12 hr tablet Take 600 mg by mouth 2 (two) times daily.     . metoprolol tartrate (LOPRESSOR) 25 MG tablet Take 0.5 tablets (12.5 mg total) by mouth 2 (two) times daily. 90 tablet 3  . ondansetron (ZOFRAN) 8 MG tablet Take 1 tablet (8 mg total) by mouth every 8 (eight) hours as needed for nausea or vomiting. 30 tablet 0  . pantoprazole (PROTONIX) 40 MG tablet Take 1 tablet (40 mg total) by  mouth daily. 90 tablet 3  . SPIRIVA RESPIMAT 2.5 MCG/ACT AERS USE 2 INHALATIONS DAILY 12 g 3  . temozolomide (TEMODAR) 100 MG capsule Four capsule p.o. daily from days 10,11,12,13 and 14 every 4 weeks.  May take on an empty stomach or at bedtime to decrease nausea & vomiting. 20 capsule 3  . vitamin B-12 (CYANOCOBALAMIN) 1000 MCG tablet Take 1,000 mcg by mouth daily.     . vitamin C (ASCORBIC ACID) 500 MG tablet Take 500 mg by mouth daily.     No current facility-administered medications for this visit.     Objective:  BP 138/78   Pulse 67   Temp 98.3 F (36.8 C) (Temporal)   Ht 6\' 3"  (1.905 m)   Wt 180 lb (81.6 kg)   SpO2 (!) 67%   BMI 22.50 kg/m  Gen: NAD, resting comfortably    Assessment and Plan   #Chronic kidney disease stage III- off affinitor now- filtration rate stable in 40s recently which has seemed to cause issues in the past. Knows to avoid nsaids.   #Diabetes mellitus- with CKD stage III as above  S: No Rx as of 06/23/2019 then later restarted glipizde 10mg  BID then down to 10mg  AM  Lab Results  Component Value Date   HGBA1C 9.1 (A) 10/21/2019  A/P: diabetes appears to be improving off affinitor Off affinitor and blood sugar is dropping. Several lows noted from 12 Am to 9 am despite reducing glipizide to 10mg  in AM only- we are going to cut to 5 mg and he will let me know if any more sugars under 70  #Hypertension associated with diabetes S: Compliant with amlodipine 10 mg and metoprolol 12.5 mg twice daily Home readings:  BP Readings from Last 3 Encounters:  01/05/20 138/78  01/05/20 (!) 163/76  12/04/19 (!) 167/85  A/P: better control here than at the cancer center. We will continue current medications for now continue to monitor-if from time the future could adjust medicines potentially.  1 to be cautious about hydrochlorothiazide due to kidney function  #Gout S: Compliant with allopurinol 100 mg. No flares on this  A/P: good control- continue current meds.     # GERD S: good control on pantoprazole 40mg   B12 levels related to PPI use: Lab Results  Component Value Date   [id number] 160 10/24/2016  A/P:  Well controlled- consider at follow up reducing to 20mg .  -consider getting b12 next time we do labs   #Constipation-patient reports improvement with MiraLAX on daily basis.  He will continue this and continue to work on remaining well-hydrated  Recommended follow up: Return in about 2 months (around 03/06/2020). Future Appointments  Date Time Provider [place]  01/14/2020  9:45 AM Marzetta Board, DPM [facility]  02/02/2020  10:30 AM [facility] LAB 2 [facility] None  02/02/2020 11:00 AM Heilingoetter, Cassandra L, PA-C [facility] None    Lab/Order associations:   ICD-10-CM   1. Hypertension associated with diabetes ([place])  E11.59    I10   2. Hyperlipidemia, unspecified hyperlipidemia type  E78.5   3. Idiopathic chronic gout of multiple sites without tophus  M1A.09X0   4. Gastroesophageal reflux disease, unspecified whether esophagitis present  K21.9   5. Type 2 diabetes mellitus with stage 3b chronic kidney disease, without long-term current use of insulin ([facility])  E11.21    N18.32     Meds ordered this encounter  Medications  . DISCONTD: pantoprazole (PROTONIX) 40 MG tablet    Sig: Take 1 tablet (40 mg total) by mouth daily.    Dispense:  90 tablet    Refill:  3  . pantoprazole (PROTONIX) 40 MG tablet    Sig: Take 1 tablet (40 mg total) by mouth daily.    Dispense:  90 tablet    Refill:  3  . glipiZIDE (GLUCOTROL) 5 MG tablet    Sig: Take 1 tablet (5 mg total) by mouth daily before breakfast.    Dispense:  90 tablet    Refill:  3   Return precautions advised.  [name], MD

## 2020-01-05 NOTE — Patient Instructions (Addendum)
Health Maintenance Due  Topic Date Due  . COLONOSCOPY  Defer for now with cancer treatment 05/25/2019   Pantoprazole prescription was sent in to mail order pharmacy for you  Given you are still having some low blood sugars-lets reduce glipizide from 10 mg to 5 mg (cut in half until you get new prescription).  Please let me know if you have any more numbers under 70-I really want to avoid any numbers under that and I may even stop the glipizide if it happens again.  No other changes today. Blood pressure high normal today- monitor at home  Recommended follow up: Return in about 2 months (around 03/06/2020).

## 2020-01-05 NOTE — Telephone Encounter (Signed)
Scheduled per 5/17 los. Printed AVS and calendar for pt.

## 2020-01-14 ENCOUNTER — Other Ambulatory Visit: Payer: Self-pay

## 2020-01-14 ENCOUNTER — Encounter: Payer: Self-pay | Admitting: Podiatry

## 2020-01-14 ENCOUNTER — Ambulatory Visit (INDEPENDENT_AMBULATORY_CARE_PROVIDER_SITE_OTHER): Payer: Medicare Other | Admitting: Podiatry

## 2020-01-14 DIAGNOSIS — B351 Tinea unguium: Secondary | ICD-10-CM

## 2020-01-14 DIAGNOSIS — M79674 Pain in right toe(s): Secondary | ICD-10-CM

## 2020-01-14 DIAGNOSIS — M79675 Pain in left toe(s): Secondary | ICD-10-CM | POA: Diagnosis not present

## 2020-01-14 DIAGNOSIS — E1151 Type 2 diabetes mellitus with diabetic peripheral angiopathy without gangrene: Secondary | ICD-10-CM

## 2020-01-14 DIAGNOSIS — R600 Localized edema: Secondary | ICD-10-CM | POA: Diagnosis not present

## 2020-01-14 NOTE — Patient Instructions (Signed)
Diabetes Mellitus and Foot Care Foot care is an important part of your health, especially when you have diabetes. Diabetes may cause you to have problems because of poor blood flow (circulation) to your feet and legs, which can cause your skin to:  Become thinner and drier.  Break more easily.  Heal more slowly.  Peel and crack. You may also have nerve damage (neuropathy) in your legs and feet, causing decreased feeling in them. This means that you may not notice minor injuries to your feet that could lead to more serious problems. Noticing and addressing any potential problems early is the best way to prevent future foot problems. How to care for your feet Foot hygiene  Wash your feet daily with warm water and mild soap. Do not use hot water. Then, pat your feet and the areas between your toes until they are completely dry. Do not soak your feet as this can dry your skin.  Trim your toenails straight across. Do not dig under them or around the cuticle. File the edges of your nails with an emery board or nail file.  Apply a moisturizing lotion or petroleum jelly to the skin on your feet and to dry, brittle toenails. Use lotion that does not contain alcohol and is unscented. Do not apply lotion between your toes. Shoes and socks  Wear clean socks or stockings every day. Make sure they are not too tight. Do not wear knee-high stockings since they may decrease blood flow to your legs.  Wear shoes that fit properly and have enough cushioning. Always look in your shoes before you put them on to be sure there are no objects inside.  To break in new shoes, wear them for just a few hours a day. This prevents injuries on your feet. Wounds, scrapes, corns, and calluses  Check your feet daily for blisters, cuts, bruises, sores, and redness. If you cannot see the bottom of your feet, use a mirror or ask someone for help.  Do not cut corns or calluses or try to remove them with medicine.  If you  find a minor scrape, cut, or break in the skin on your feet, keep it and the skin around it clean and dry. You may clean these areas with mild soap and water. Do not clean the area with peroxide, alcohol, or iodine.  If you have a wound, scrape, corn, or callus on your foot, look at it several times a day to make sure it is healing and not infected. Check for: ? Redness, swelling, or pain. ? Fluid or blood. ? Warmth. ? Pus or a bad smell. General instructions  Do not cross your legs. This may decrease blood flow to your feet.  Do not use heating pads or hot water bottles on your feet. They may burn your skin. If you have lost feeling in your feet or legs, you may not know this is happening until it is too late.  Protect your feet from hot and cold by wearing shoes, such as at the beach or on hot pavement.  Schedule a complete foot exam at least once a year (annually) or more often if you have foot problems. If you have foot problems, report any cuts, sores, or bruises to your health care provider immediately. Contact a health care provider if:  You have a medical condition that increases your risk of infection and you have any cuts, sores, or bruises on your feet.  You have an injury that is not   healing.  You have redness on your legs or feet.  You feel burning or tingling in your legs or feet.  You have pain or cramps in your legs and feet.  Your legs or feet are numb.  Your feet always feel cold.  You have pain around a toenail. Get help right away if:  You have a wound, scrape, corn, or callus on your foot and: ? You have pain, swelling, or redness that gets worse. ? You have fluid or blood coming from the wound, scrape, corn, or callus. ? Your wound, scrape, corn, or callus feels warm to the touch. ? You have pus or a bad smell coming from the wound, scrape, corn, or callus. ? You have a fever. ? You have a red line going up your leg. Summary  Check your feet every day  for cuts, sores, red spots, swelling, and blisters.  Moisturize feet and legs daily.  Wear shoes that fit properly and have enough cushioning.  If you have foot problems, report any cuts, sores, or bruises to your health care provider immediately.  Schedule a complete foot exam at least once a year (annually) or more often if you have foot problems. This information is not intended to replace advice given to you by your health care provider. Make sure you discuss any questions you have with your health care provider. Document Revised: 04/30/2019 Document Reviewed: 09/08/2016 Elsevier Patient Education  2020 Elsevier Inc.  
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
## 2020-01-20 MED FILL — TEMOZOLOMIDE 100 MG CAPS: 100 | 28 days supply | Qty: 20 | Fill #2

## 2020-01-20 MED FILL — CAPECITABINE 500 MG TABLET: 500 | 14 days supply | Qty: 84 | Fill #2

## 2020-01-20 MED FILL — ONDANSETRON HCL 8 MG TABLET: 8 | 10 days supply | Qty: 30 | Fill #0

## 2020-01-26 ENCOUNTER — Encounter (HOSPITAL_COMMUNITY): Payer: Self-pay

## 2020-01-26 ENCOUNTER — Inpatient Hospital Stay (HOSPITAL_COMMUNITY)
Admission: EM | Admit: 2020-01-26 | Discharge: 2020-01-28 | DRG: 871 | Disposition: A | Discharge status: Home / Self Care | Payer: Medicare Other | Attending: Internal Medicine | Admitting: Internal Medicine

## 2020-01-26 ENCOUNTER — Other Ambulatory Visit: Payer: Self-pay

## 2020-01-26 ENCOUNTER — Emergency Department (HOSPITAL_COMMUNITY): Payer: Medicare Other

## 2020-01-26 DIAGNOSIS — Z87891 Personal history of nicotine dependence: Secondary | ICD-10-CM | POA: Diagnosis not present

## 2020-01-26 DIAGNOSIS — D3A8 Other benign neuroendocrine tumors: Secondary | ICD-10-CM | POA: Diagnosis present

## 2020-01-26 DIAGNOSIS — Z79899 Other long term (current) drug therapy: Secondary | ICD-10-CM

## 2020-01-26 DIAGNOSIS — J189 Pneumonia, unspecified organism: Secondary | ICD-10-CM | POA: Diagnosis present

## 2020-01-26 DIAGNOSIS — I152 Hypertension secondary to endocrine disorders: Secondary | ICD-10-CM | POA: Diagnosis present

## 2020-01-26 DIAGNOSIS — R000 Tachycardia, unspecified: Secondary | ICD-10-CM | POA: Diagnosis not present

## 2020-01-26 DIAGNOSIS — Z7984 Long term (current) use of oral hypoglycemic drugs: Secondary | ICD-10-CM

## 2020-01-26 DIAGNOSIS — R509 Fever, unspecified: Secondary | ICD-10-CM | POA: Diagnosis not present

## 2020-01-26 DIAGNOSIS — J9600 Acute respiratory failure, unspecified whether with hypoxia or hypercapnia: Secondary | ICD-10-CM | POA: Diagnosis present

## 2020-01-26 DIAGNOSIS — C7A8 Other malignant neuroendocrine tumors: Secondary | ICD-10-CM | POA: Diagnosis present

## 2020-01-26 DIAGNOSIS — M109 Gout, unspecified: Secondary | ICD-10-CM | POA: Diagnosis present

## 2020-01-26 DIAGNOSIS — Z7951 Long term (current) use of inhaled steroids: Secondary | ICD-10-CM

## 2020-01-26 DIAGNOSIS — R0902 Hypoxemia: Secondary | ICD-10-CM | POA: Diagnosis not present

## 2020-01-26 DIAGNOSIS — N1832 Chronic kidney disease, stage 3b: Secondary | ICD-10-CM | POA: Diagnosis present

## 2020-01-26 DIAGNOSIS — K219 Gastro-esophageal reflux disease without esophagitis: Secondary | ICD-10-CM | POA: Diagnosis present

## 2020-01-26 DIAGNOSIS — Z800 Family history of malignant neoplasm of digestive organs: Secondary | ICD-10-CM

## 2020-01-26 DIAGNOSIS — Z66 Do not resuscitate: Secondary | ICD-10-CM | POA: Diagnosis present

## 2020-01-26 DIAGNOSIS — D649 Anemia, unspecified: Secondary | ICD-10-CM | POA: Diagnosis present

## 2020-01-26 DIAGNOSIS — F4310 Post-traumatic stress disorder, unspecified: Secondary | ICD-10-CM | POA: Diagnosis present

## 2020-01-26 DIAGNOSIS — F3342 Major depressive disorder, recurrent, in full remission: Secondary | ICD-10-CM | POA: Diagnosis present

## 2020-01-26 DIAGNOSIS — Z8249 Family history of ischemic heart disease and other diseases of the circulatory system: Secondary | ICD-10-CM

## 2020-01-26 DIAGNOSIS — E785 Hyperlipidemia, unspecified: Secondary | ICD-10-CM | POA: Diagnosis present

## 2020-01-26 DIAGNOSIS — J440 Chronic obstructive pulmonary disease with acute lower respiratory infection: Secondary | ICD-10-CM | POA: Diagnosis present

## 2020-01-26 DIAGNOSIS — E1122 Type 2 diabetes mellitus with diabetic chronic kidney disease: Secondary | ICD-10-CM | POA: Diagnosis present

## 2020-01-26 DIAGNOSIS — Z86010 Personal history of colonic polyps: Secondary | ICD-10-CM | POA: Diagnosis not present

## 2020-01-26 DIAGNOSIS — I129 Hypertensive chronic kidney disease with stage 1 through stage 4 chronic kidney disease, or unspecified chronic kidney disease: Secondary | ICD-10-CM | POA: Diagnosis present

## 2020-01-26 DIAGNOSIS — E1169 Type 2 diabetes mellitus with other specified complication: Secondary | ICD-10-CM | POA: Diagnosis present

## 2020-01-26 DIAGNOSIS — A419 Sepsis, unspecified organism: Secondary | ICD-10-CM | POA: Diagnosis present

## 2020-01-26 DIAGNOSIS — Z888 Allergy status to other drugs, medicaments and biological substances status: Secondary | ICD-10-CM

## 2020-01-26 DIAGNOSIS — Z801 Family history of malignant neoplasm of trachea, bronchus and lung: Secondary | ICD-10-CM

## 2020-01-26 DIAGNOSIS — J449 Chronic obstructive pulmonary disease, unspecified: Secondary | ICD-10-CM | POA: Diagnosis present

## 2020-01-26 DIAGNOSIS — D630 Anemia in neoplastic disease: Secondary | ICD-10-CM | POA: Diagnosis present

## 2020-01-26 DIAGNOSIS — R0689 Other abnormalities of breathing: Secondary | ICD-10-CM | POA: Diagnosis not present

## 2020-01-26 DIAGNOSIS — Z85118 Personal history of other malignant neoplasm of bronchus and lung: Secondary | ICD-10-CM | POA: Diagnosis not present

## 2020-01-26 DIAGNOSIS — D3A090 Benign carcinoid tumor of the bronchus and lung: Secondary | ICD-10-CM | POA: Diagnosis present

## 2020-01-26 DIAGNOSIS — I10 Essential (primary) hypertension: Secondary | ICD-10-CM | POA: Diagnosis present

## 2020-01-26 DIAGNOSIS — E1129 Type 2 diabetes mellitus with other diabetic kidney complication: Secondary | ICD-10-CM | POA: Diagnosis present

## 2020-01-26 DIAGNOSIS — Z20822 Contact with and (suspected) exposure to covid-19: Secondary | ICD-10-CM | POA: Diagnosis present

## 2020-01-26 DIAGNOSIS — Z9221 Personal history of antineoplastic chemotherapy: Secondary | ICD-10-CM | POA: Diagnosis not present

## 2020-01-26 DIAGNOSIS — C787 Secondary malignant neoplasm of liver and intrahepatic bile duct: Secondary | ICD-10-CM | POA: Diagnosis present

## 2020-01-26 DIAGNOSIS — E1159 Type 2 diabetes mellitus with other circulatory complications: Secondary | ICD-10-CM | POA: Diagnosis present

## 2020-01-26 DIAGNOSIS — M1990 Unspecified osteoarthritis, unspecified site: Secondary | ICD-10-CM | POA: Diagnosis present

## 2020-01-26 DIAGNOSIS — Z833 Family history of diabetes mellitus: Secondary | ICD-10-CM

## 2020-01-26 LAB — HIV ANTIBODY (ROUTINE TESTING W REFLEX): HIV Screen 4th Generation wRfx: NONREACTIVE

## 2020-01-26 LAB — URINALYSIS, ROUTINE W REFLEX MICROSCOPIC
Bilirubin Urine: NEGATIVE
Glucose, UA: NEGATIVE mg/dL
Hgb urine dipstick: NEGATIVE
Ketones, ur: NEGATIVE mg/dL
Leukocytes,Ua: NEGATIVE
Nitrite: NEGATIVE
Protein, ur: NEGATIVE mg/dL
Specific Gravity, Urine: 1.009 (ref 1.005–1.030)
pH: 7 (ref 5.0–8.0)

## 2020-01-26 LAB — CBC WITH DIFFERENTIAL/PLATELET
Abs Immature Granulocytes: 0.03 10*3/uL (ref 0.00–0.07)
Basophils Absolute: 0 10*3/uL (ref 0.0–0.1)
Basophils Relative: 0 %
Eosinophils Absolute: 0 10*3/uL (ref 0.0–0.5)
Eosinophils Relative: 0 %
HCT: 34.7 % — ABNORMAL LOW (ref 39.0–52.0)
Hemoglobin: 11.7 g/dL — ABNORMAL LOW (ref 13.0–17.0)
Immature Granulocytes: 0 %
Lymphocytes Relative: 5 %
Lymphs Abs: 0.5 10*3/uL — ABNORMAL LOW (ref 0.7–4.0)
MCH: 31.7 pg (ref 26.0–34.0)
MCHC: 33.7 g/dL (ref 30.0–36.0)
MCV: 94 fL (ref 80.0–100.0)
Monocytes Absolute: 0.5 10*3/uL (ref 0.1–1.0)
Monocytes Relative: 4 %
Neutro Abs: 10.6 10*3/uL — ABNORMAL HIGH (ref 1.7–7.7)
Neutrophils Relative %: 91 %
Platelets: 251 10*3/uL (ref 150–400)
RBC: 3.69 MIL/uL — ABNORMAL LOW (ref 4.22–5.81)
RDW: 19.8 % — ABNORMAL HIGH (ref 11.5–15.5)
WBC: 11.6 10*3/uL — ABNORMAL HIGH (ref 4.0–10.5)
nRBC: 0 % (ref 0.0–0.2)

## 2020-01-26 LAB — CBC
HCT: 29.5 % — ABNORMAL LOW (ref 39.0–52.0)
Hemoglobin: 10.2 g/dL — ABNORMAL LOW (ref 13.0–17.0)
MCH: 32.9 pg (ref 26.0–34.0)
MCHC: 34.6 g/dL (ref 30.0–36.0)
MCV: 95.2 fL (ref 80.0–100.0)
Platelets: 203 10*3/uL (ref 150–400)
RBC: 3.1 MIL/uL — ABNORMAL LOW (ref 4.22–5.81)
RDW: 19.8 % — ABNORMAL HIGH (ref 11.5–15.5)
WBC: 11.9 10*3/uL — ABNORMAL HIGH (ref 4.0–10.5)
nRBC: 0 % (ref 0.0–0.2)

## 2020-01-26 LAB — COMPREHENSIVE METABOLIC PANEL
ALT: 15 U/L (ref 0–44)
AST: 19 U/L (ref 15–41)
Albumin: 4.2 g/dL (ref 3.5–5.0)
Alkaline Phosphatase: 60 U/L (ref 38–126)
Anion gap: 11 (ref 5–15)
BUN: 31 mg/dL — ABNORMAL HIGH (ref 8–23)
CO2: 24 mmol/L (ref 22–32)
Calcium: 9.6 mg/dL (ref 8.9–10.3)
Chloride: 103 mmol/L (ref 98–111)
Creatinine, Ser: 1.73 mg/dL — ABNORMAL HIGH (ref 0.61–1.24)
GFR calc Af Amer: 44 mL/min — ABNORMAL LOW (ref >60)
GFR calc non Af Amer: 38 mL/min — ABNORMAL LOW (ref >60)
Glucose, Bld: 165 mg/dL — ABNORMAL HIGH (ref 70–99)
Potassium: 3.8 mmol/L (ref 3.5–5.1)
Sodium: 138 mmol/L (ref 135–145)
Total Bilirubin: 0.7 mg/dL (ref 0.3–1.2)
Total Protein: 7.9 g/dL (ref 6.5–8.1)

## 2020-01-26 LAB — PROTIME-INR
INR: 1 (ref 0.8–1.2)
Prothrombin Time: 12.5 seconds (ref 11.4–15.2)

## 2020-01-26 LAB — CREATININE, SERUM
Creatinine, Ser: 1.65 mg/dL — ABNORMAL HIGH (ref 0.61–1.24)
GFR calc Af Amer: 46 mL/min — ABNORMAL LOW (ref >60)
GFR calc non Af Amer: 40 mL/min — ABNORMAL LOW (ref >60)

## 2020-01-26 LAB — APTT: aPTT: 24 seconds (ref 24–36)

## 2020-01-26 LAB — LACTIC ACID, PLASMA
Lactic Acid, Venous: 2.2 mmol/L (ref 0.5–1.9)
Lactic Acid, Venous: 2 mmol/L (ref 0.5–1.9)

## 2020-01-26 LAB — GLUCOSE, CAPILLARY
Glucose-Capillary: 138 mg/dL — ABNORMAL HIGH (ref 70–99)
Glucose-Capillary: 111 mg/dL — ABNORMAL HIGH (ref 70–99)

## 2020-01-26 LAB — HEMOGLOBIN A1C
Hgb A1c MFr Bld: 6.4 % — ABNORMAL HIGH (ref 4.8–5.6)
Mean Plasma Glucose: 136.98 mg/dL

## 2020-01-26 LAB — STREP PNEUMONIAE URINARY ANTIGEN: Strep Pneumo Urinary Antigen: NEGATIVE

## 2020-01-26 LAB — SARS CORONAVIRUS 2 BY RT PCR (HOSPITAL ORDER, PERFORMED IN ~~LOC~~ HOSPITAL LAB): SARS Coronavirus 2: NEGATIVE

## 2020-01-26 MED ORDER — IPRATROPIUM-ALBUTEROL 0.5-2.5 (3) MG/3ML IN SOLN: 3.0000 mL | RESPIRATORY_TRACT | Status: DC | PRN

## 2020-01-26 MED ORDER — ENOXAPARIN SODIUM 40 MG/0.4ML ~~LOC~~ SOLN
40.0000 mg | SUBCUTANEOUS | Status: DC
  Administered 2020-01-26 – 2020-01-27 (×2): 40 mg via SUBCUTANEOUS
  Filled 2020-01-26 (×2): qty 0.4

## 2020-01-26 MED ORDER — ACETAMINOPHEN 650 MG RE SUPP: 650.0000 mg | Freq: Four times a day (QID) | RECTAL | Status: DC | PRN

## 2020-01-26 MED ORDER — SODIUM CHLORIDE 0.9 % IV SOLN
500.0000 mg | Freq: Every day | INTRAVENOUS | Status: DC
  Administered 2020-01-26: 500 mg via INTRAVENOUS
  Filled 2020-01-26: qty 500

## 2020-01-26 MED ORDER — SENNOSIDES-DOCUSATE SODIUM 8.6-50 MG PO TABS: 1.0000 | ORAL_TABLET | Freq: Every evening | ORAL | Status: DC | PRN

## 2020-01-26 MED ORDER — INSULIN ASPART 100 UNIT/ML ~~LOC~~ SOLN
0.0000 [IU] | Freq: Every day | SUBCUTANEOUS | Status: DC
  Filled 2020-01-26: qty 0.05

## 2020-01-26 MED ORDER — ACETAMINOPHEN 325 MG PO TABS
650.0000 mg | ORAL_TABLET | Freq: Once | ORAL | Status: AC
  Administered 2020-01-26: 650 mg via ORAL
  Filled 2020-01-26: qty 2

## 2020-01-26 MED ORDER — ONDANSETRON HCL 4 MG PO TABS: 4.0000 mg | ORAL_TABLET | Freq: Four times a day (QID) | ORAL | Status: DC | PRN

## 2020-01-26 MED ORDER — SODIUM CHLORIDE 0.9 % IV SOLN: Freq: Once | INTRAVENOUS | Status: AC

## 2020-01-26 MED ORDER — SODIUM CHLORIDE 0.9 % IV SOLN
2.0000 g | Freq: Every day | INTRAVENOUS | Status: DC
  Administered 2020-01-26: 2 g via INTRAVENOUS
  Filled 2020-01-26: qty 20

## 2020-01-26 MED ORDER — ACETAMINOPHEN 325 MG PO TABS: 650.0000 mg | ORAL_TABLET | Freq: Four times a day (QID) | ORAL | Status: DC | PRN

## 2020-01-26 MED ORDER — SODIUM CHLORIDE 0.9 % IV SOLN
500.0000 mg | INTRAVENOUS | Status: DC
  Administered 2020-01-27 – 2020-01-28 (×2): 500 mg via INTRAVENOUS
  Filled 2020-01-26 (×2): qty 500

## 2020-01-26 MED ORDER — ONDANSETRON HCL 4 MG/2ML IJ SOLN: 4.0000 mg | Freq: Four times a day (QID) | INTRAMUSCULAR | Status: DC | PRN

## 2020-01-26 MED ORDER — SODIUM CHLORIDE 0.9 % IV BOLUS (SEPSIS)
1000.0000 mL | Freq: Once | INTRAVENOUS | Status: AC
  Administered 2020-01-26: 1000 mL via INTRAVENOUS

## 2020-01-26 MED ORDER — SODIUM CHLORIDE 0.9 % IV SOLN
2.0000 g | INTRAVENOUS | Status: DC
  Administered 2020-01-27 – 2020-01-28 (×2): 2 g via INTRAVENOUS
  Filled 2020-01-26 (×2): qty 20

## 2020-01-26 MED ORDER — INSULIN ASPART 100 UNIT/ML ~~LOC~~ SOLN
0.0000 [IU] | Freq: Three times a day (TID) | SUBCUTANEOUS | Status: DC
  Administered 2020-01-26 – 2020-01-27 (×3): 1 [IU] via SUBCUTANEOUS
  Filled 2020-01-26: qty 0.09

## 2020-01-26 NOTE — ED Notes (Signed)
Date and time results received: 01/26/20 7:41 AM   Test: lactic acid Critical Value: 2.2  Name of Provider Notified: Tomi Bamberger MD  Orders Received? Or Actions Taken?: acknowledges result
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
## 2020-01-26 NOTE — ED Notes (Signed)
This RN called to give report, receiving RN unable to take report at this time. Left call back number with secretary.

## 2020-01-26 NOTE — ED Triage Notes (Signed)
Patient arrives from home due to a possible sepsis call, wife states patient has been having increasing weakness, breathing heavy and running a fever.   EMS Vitals 112Hr 32R 150/80 T 103F 95% RA

## 2020-01-27 DIAGNOSIS — A419 Sepsis, unspecified organism: Principal | ICD-10-CM

## 2020-01-27 DIAGNOSIS — J189 Pneumonia, unspecified organism: Secondary | ICD-10-CM

## 2020-01-27 LAB — GLUCOSE, CAPILLARY
Glucose-Capillary: 103 mg/dL — ABNORMAL HIGH (ref 70–99)
Glucose-Capillary: 135 mg/dL — ABNORMAL HIGH (ref 70–99)
Glucose-Capillary: 127 mg/dL — ABNORMAL HIGH (ref 70–99)

## 2020-01-27 LAB — BRAIN NATRIURETIC PEPTIDE: B Natriuretic Peptide: 134.8 pg/mL — ABNORMAL HIGH (ref 0.0–100.0)

## 2020-01-27 LAB — BASIC METABOLIC PANEL
Anion gap: 10 (ref 5–15)
BUN: 22 mg/dL (ref 8–23)
CO2: 22 mmol/L (ref 22–32)
Calcium: 8.9 mg/dL (ref 8.9–10.3)
Chloride: 106 mmol/L (ref 98–111)
Creatinine, Ser: 1.69 mg/dL — ABNORMAL HIGH (ref 0.61–1.24)
GFR calc Af Amer: 45 mL/min — ABNORMAL LOW (ref >60)
GFR calc non Af Amer: 39 mL/min — ABNORMAL LOW (ref >60)
Glucose, Bld: 112 mg/dL — ABNORMAL HIGH (ref 70–99)
Potassium: 3.6 mmol/L (ref 3.5–5.1)
Sodium: 138 mmol/L (ref 135–145)

## 2020-01-27 LAB — CBC
HCT: 28 % — ABNORMAL LOW (ref 39.0–52.0)
Hemoglobin: 9.6 g/dL — ABNORMAL LOW (ref 13.0–17.0)
MCH: 32.1 pg (ref 26.0–34.0)
MCHC: 34.3 g/dL (ref 30.0–36.0)
MCV: 93.6 fL (ref 80.0–100.0)
Platelets: 184 10*3/uL (ref 150–400)
RBC: 2.99 MIL/uL — ABNORMAL LOW (ref 4.22–5.81)
RDW: 19.4 % — ABNORMAL HIGH (ref 11.5–15.5)
WBC: 10 10*3/uL (ref 4.0–10.5)
nRBC: 0 % (ref 0.0–0.2)

## 2020-01-27 LAB — LEGIONELLA PNEUMOPHILA SEROGP 1 UR AG: L. pneumophila Serogp 1 Ur Ag: NEGATIVE

## 2020-01-27 LAB — PROCALCITONIN: Procalcitonin: 9.98 ng/mL

## 2020-01-27 MED ORDER — VANCOMYCIN HCL 1250 MG/250ML IV SOLN
1250.0000 mg | INTRAVENOUS | Status: DC
  Administered 2020-01-27: 1250 mg via INTRAVENOUS
  Filled 2020-01-27 (×2): qty 250

## 2020-01-27 MED ORDER — SENNOSIDES-DOCUSATE SODIUM 8.6-50 MG PO TABS: 2.0000 | ORAL_TABLET | Freq: Every evening | ORAL | Status: DC | PRN

## 2020-01-27 MED ORDER — IPRATROPIUM-ALBUTEROL 0.5-2.5 (3) MG/3ML IN SOLN: 3.0000 mL | RESPIRATORY_TRACT | Status: DC | PRN

## 2020-01-27 MED ORDER — IPRATROPIUM-ALBUTEROL 0.5-2.5 (3) MG/3ML IN SOLN
3.0000 mL | Freq: Four times a day (QID) | RESPIRATORY_TRACT | Status: DC
  Administered 2020-01-27 – 2020-01-28 (×4): 3 mL via RESPIRATORY_TRACT
  Filled 2020-01-27 (×4): qty 3

## 2020-01-27 MED ORDER — IPRATROPIUM-ALBUTEROL 0.5-2.5 (3) MG/3ML IN SOLN: 3.0000 mL | Freq: Four times a day (QID) | RESPIRATORY_TRACT | Status: DC

## 2020-01-27 MED ORDER — POLYETHYLENE GLYCOL 3350 17 G PO PACK: 17.0000 g | PACK | Freq: Every day | ORAL | Status: DC | PRN

## 2020-01-27 NOTE — Progress Notes (Signed)
Walked with patient in hallway. Patient tolerated well, O2 saturation stayed between 92-94% on room air.

## 2020-01-27 NOTE — Progress Notes (Signed)
PROGRESS NOTE    Richard Navarro  QIO:[id number] DOB: 27-Oct-1945 DOA: 01/26/2020 PCP: [name], MD   Brief Narrative:  74 year old with history of COPD/emphysema, CKD stage III, DM2, HTN, HLD, metastatic neuroendocrine carcinoma, PTSD, GERD, osteoarthritis, gout presented with progressive shortness of breath over past 3 days.  Upon admission he was noted to be septic concerns for community-acquired pneumonia started on Rocephin and azithromycin.   Assessment & Plan:   Principal Problem:   Sepsis due to pneumonia [facility]) Active Problems:   Diabetes mellitus with renal manifestation ([place])   Hypertension associated with diabetes ([place])   Gout   Arthritis   GERD (gastroesophageal reflux disease)   COPD (chronic obstructive pulmonary disease) (HCC)   Neuroendocrine carcinoma (HCC)   Chronic kidney disease (CKD) stage G3b/A1, moderately decreased glomerular filtration rate (GFR) between 30-44 mL/min/1.73 square meter and albuminuria creatinine ratio less than 30 mg/g   Anemia   Recurrent major depressive disorder, in full remission ([place])  Sepsis in the setting of community-acquired pneumonia, POA.  Right lower base. Acute respiratory failure without hypoxia or hypercarbia History of COPD without exacerbation -Procalcitonin elevated at 8.0.  Continue IV Rocephin and azithromycin.  Will need scheduled and as needed bronchodilators.  Ambulatory pulse ox.  BNP is very minimally elevated. -Scheduled and as needed bronchodilators -I-S/flutter valve -If necessary will get CTA chest to rule out PE  CKD 3B at baseline -Patient is near his baseline creatinine of 1.7.  Closely continue to monitor this.  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  Noninsulin-dependent  -Home medication on hold, insulin sliding scale and Accu-Chek     DVT prophylaxis: Lovenox Code Status: DNR Family Communication: None at bedside  Status is: Inpatient  Remains inpatient appropriate because:Inpatient level of care appropriate due to severity of illness   Dispo: The patient is from: Home              Anticipated d/c is to: Home              Anticipated d/c date is: 2 days              Patient currently is not medically stable to d/c.  Patient is not hypoxic he still quite short of breath with minimal exertion.  Unsafe for discharge as he requires aggressive bronchodilator treatment, IV antibiotics.  Overall he is immunosuppressed given he is on chemotherapy.  Unsafe for discharge   Subjective: Still having exertional shortness of breath but improved since this time of admission  Review of Systems Otherwise negative except as per HPI, including: General: Denies fever, chills, night sweats or unintended weight loss. Resp: Denies hemoptysis Cardiac: Denies chest pain, palpitations, orthopnea, paroxysmal nocturnal dyspnea. GI: Denies abdominal pain, nausea, vomiting, diarrhea or constipation GU: Denies dysuria, frequency, hesitancy or incontinence MS: Denies muscle aches, joint pain or swelling Neuro: Denies headache, neurologic deficits (focal weakness, numbness, tingling), abnormal gait Psych: Denies anxiety, depression, SI/HI/AVH Skin: Denies new rashes or lesions ID: Denies sick contacts, exotic exposures, travel  Examination:  General exam: Appears calm and comfortable  Respiratory system: Diminished sounds of the right lower base Cardiovascular system: S1 & S2 heard, RRR. No JVD, murmurs, rubs, gallops or clicks. No pedal edema. Gastrointestinal system: Abdomen is nondistended, soft and nontender. No organomegaly or masses  felt. Normal bowel sounds heard. Central nervous system: Alert and oriented. No focal neurological  deficits. Extremities: Symmetric 5 x 5 power. Skin: No rashes, lesions or ulcers Psychiatry: Judgement and insight appear normal. Mood & affect appropriate.     Objective: Vitals:   01/26/20 1616 01/26/20 2000 01/27/20 0025 01/27/20 0537  BP: (!) 124/57 137/65 (!) 146/73 (!) 152/80  Pulse: 88 82 79 86  Resp: 16 18 17 17   Temp: 99.5 F (37.5 C) 99.1 F (37.3 C) 99.3 F (37.4 C)   TempSrc: Oral Oral Oral   SpO2: 96% 95%  94%  Weight:      Height:        Intake/Output Summary (Last 24 hours) at 01/27/2020 1024 Last data filed at 01/27/2020 0733 Gross per 24 hour  Intake 240 ml  Output 1300 ml  Net -1060 ml   Filed Weights   01/26/20 0908  Weight: 77.1 kg     Data Reviewed:   CBC: Recent Labs  Lab 01/26/20 0644 01/26/20 1223 01/27/20 0630  WBC 11.6* 11.9* 10.0  NEUTROABS 10.6*  --   --   HGB 11.7* 10.2* 9.6*  HCT 34.7* 29.5* 28.0*  MCV 94.0 95.2 93.6  PLT 251 203 401   Basic Metabolic Panel: Recent Labs  Lab 01/26/20 0644 01/26/20 0835 01/27/20 0630  NA 138  --  138  K 3.8  --  3.6  CL 103  --  106  CO2 24  --  22  GLUCOSE 165*  --  112*  BUN 31*  --  22  CREATININE 1.73* 1.65* 1.69*  CALCIUM 9.6  --  8.9   GFR: Estimated Creatinine Clearance: 41.2 mL/min (A) (by C-G formula based on SCr of 1.69 mg/dL (H)). Liver Function Tests: Recent Labs  Lab 01/26/20 0644  AST 19  ALT 15  ALKPHOS 60  BILITOT 0.7  PROT 7.9  ALBUMIN 4.2   No results for input(s): LIPASE, AMYLASE in the last 168 hours. No results for input(s): AMMONIA in the last 168 hours. Coagulation Profile: Recent Labs  Lab 01/26/20 0644  INR 1.0   Cardiac Enzymes: No results for input(s): CKTOTAL, CKMB, CKMBINDEX, TROPONINI in the last 168 hours. BNP (last 3 results) No results for input(s): PROBNP in the last 8760 hours. HbA1C: Recent Labs    01/26/20 0834  HGBA1C 6.4*   CBG: Recent Labs  Lab 01/26/20 1615 01/26/20 2203 01/27/20 0807  GLUCAP 138* 111* 103*   Lipid  Profile: No results for input(s): CHOL, HDL, LDLCALC, TRIG, CHOLHDL, LDLDIRECT in the last 72 hours. Thyroid Function Tests: No results for input(s): TSH, T4TOTAL, FREET4, T3FREE, THYROIDAB in the last 72 hours. Anemia Panel: No results for input(s): VITAMINB12, FOLATE, FERRITIN, TIBC, IRON, RETICCTPCT in the last 72 hours. Sepsis Labs: Recent Labs  Lab 01/26/20 0644 01/26/20 0844 01/27/20 0630  PROCALCITON  --   --  9.98  LATICACIDVEN 2.2* 2.0*  --     Recent Results (from the past 240 hour(s))  Culture, blood (Routine x 2)     Status: None (Preliminary result)   Collection Time: 01/26/20  6:49 AM   Specimen: BLOOD RIGHT FOREARM  Result Value Ref Range Status   Specimen Description   Final    BLOOD RIGHT FOREARM Performed at [facility] [address], [place], [place] [address]    Special Requests   Final    BOTTLES DRAWN AEROBIC AND ANAEROBIC Blood Culture adequate volume Performed at [facility], [address] [place], [place] [address]    Culture   Final    NO GROWTH < 24 HOURS Performed at [facility] [address] [place], [place] [address]    Report Status PENDING  Incomplete  Culture, blood (Routine x 2)     Status: None (Preliminary result)   Collection Time: 01/26/20  6:49 AM   Specimen: BLOOD LEFT FOREARM  Result Value Ref Range Status   Specimen Description   Final    BLOOD LEFT FOREARM Performed at [facility] [address], [place], [place] [address]    Special Requests   Final    BOTTLES DRAWN AEROBIC AND ANAEROBIC Blood Culture adequate volume Performed at [facility] [address], [place], [place] [address]    Culture   Final    NO GROWTH < 24 HOURS Performed at [facility] [address] [place], [place] [address]    Report Status PENDING  Incomplete  Urine culture     Status: None (Preliminary result)   Collection Time: 01/26/20  6:53 AM    Specimen: In/Out Cath Urine  Result Value Ref Range Status   Specimen Description   Final    IN/OUT CATH URINE Performed at [facility] [address] [place], [place] [address]    Special Requests   Final    NONE Performed at [facility], [place] [address] [place], [place] [address]    Culture   Final    CULTURE REINCUBATED FOR BETTER GROWTH Performed at [facility], [place] [address], [place],  [address]    Report Status PENDING  Incomplete  SARS Coronavirus 2 by RT PCR (hospital order, performed in [facility] hospital lab) Nasopharyngeal Nasopharyngeal Swab     Status: None   Collection Time: 01/26/20  6:55 AM   Specimen: Nasopharyngeal Swab  Result Value Ref Range Status   SARS Coronavirus 2 NEGATIVE NEGATIVE Final    Comment: (NOTE) SARS-CoV-2 target nucleic acids are NOT DETECTED. The SARS-CoV-2 RNA is generally detectable in upper and lower respiratory specimens during the acute phase of infection. The lowest concentration of SARS-CoV-2 viral copies this assay can detect is 250 copies / mL. A negative result does not preclude SARS-CoV-2 infection and should not be used as the sole basis for treatment or other patient management decisions.  A negative result may occur with improper specimen collection / handling, submission of specimen other than nasopharyngeal swab, presence of viral mutation(s) within the areas targeted by this assay, and inadequate number of viral copies (<250 copies / mL). A negative result must be combined with clinical observations, patient history, and epidemiological information. Fact Sheet for Patients:   StrictlyIdeas.no Fact Sheet for Healthcare Providers: BankingDealers.co.za This test is not yet approved or cleared  by the [place] FDA and has been authorized for detection and/or diagnosis of SARS-CoV-2 by FDA under an Emergency Use  Authorization (EUA).  This EUA will remain in effect (meaning this test can be used) for the duration of the COVID-19 declaration under Section 564(b)(1) of the Act, 21 U.S.C. section 360bbb-3(b)(1), unless the authorization is terminated or revoked sooner. Performed at [facility], [place] [address] [place],  [address]          Radiology Studies: DG Chest 2 View  Result Date: 01/26/2020 CLINICAL DATA:  Suspected sepsis EXAM: CHEST - 2 VIEW COMPARISON:  08/18/2019 CT FINDINGS: Asymmetric reticulation at the  right lung base where there is also medial opacity blunting the cardiophrenic sulcus. Blunting at the posterior costophrenic sulci which could be from pleural fluid or a known left-sided fatty Bochdalek's hernia. Normal heart size. Emphysema. IMPRESSION: 1. New infiltrate at the right base. 2. Known right lower lobe mass. Electronically Signed   By: Monte Fantasia M.D.   On: 01/26/2020 07:41        Scheduled Meds: . enoxaparin (LOVENOX) injection  40 mg Subcutaneous Q24H  . insulin aspart  0-5 Units Subcutaneous QHS  . insulin aspart  0-9 Units Subcutaneous TID WC   Continuous Infusions: . azithromycin    . cefTRIAXone (ROCEPHIN)  IV 2 g (01/27/20 0839)     LOS: 1 day   Time spent= 35 mins    Richard Creighton Arsenio Loader, MD [facility]  If 7PM-7AM, please contact night-coverage  01/27/2020, 10:24 AM

## 2020-01-27 NOTE — Progress Notes (Signed)
Nutrition Brief Note  Patient identified on the Malnutrition Screening Tool (MST) Report  Patient reports eating well. Pt consumed 75% of dinner last night and ate most of his breakfast this morning of eggs, bacon, and fruit cup. Pt denies any nutrition impact symptoms.  Encouraged pt to order lunch during visit.   UBW per weight records: 171-173 lbs.  Wt Readings from Last 15 Encounters:  01/26/20 77.1 kg  01/05/20 81.6 kg  01/05/20 80.4 kg  12/04/19 77.2 kg  12/01/19 75.8 kg  11/19/19 78.1 kg  10/29/19 77.6 kg  10/21/19 78.1 kg  09/30/19 78 kg  09/17/19 78.9 kg  08/20/19 78 kg  07/22/19 76.7 kg  06/23/19 74.5 kg  05/20/19 73.1 kg  05/12/19 73.9 kg    Body mass index is 21.83 kg/m. Patient meets criteria for normal based on current BMI.   Current diet order is CHO modified, patient is consuming approximately 75% of meals at this time. Labs and medications reviewed.   No nutrition interventions warranted at this time. If nutrition issues arise, please consult RD.   Clayton Bibles, MS, RD, LDN Inpatient Clinical Dietitian Contact information available via Amion

## 2020-01-27 NOTE — Progress Notes (Signed)
Pharmacy Antibiotic Note  Richard Navarro is a 74 y.o. male admitted on 01/26/2020 with UTI.  Pharmacy has been consulted for vancomycin dosing.  Plan: Vancomycin 1250 IV every 24 hours.  Goal trough 10-15 mcg/mL.  Ceftriaxone/azithromycin per MD Monitor clinical course, renal function, cultures as available   Height: 6\' 2"  (188 cm) Weight: 77.1 kg (170 lb) IBW/kg (Calculated) : 82.2  Temp (24hrs), Avg:99.3 F (37.4 C), Min:99.1 F (37.3 C), Max:99.5 F (37.5 C)  Recent Labs  Lab 01/26/20 0644 01/26/20 0835 01/26/20 0844 01/26/20 1223 01/27/20 0630  WBC 11.6*  --   --  11.9* 10.0  CREATININE 1.73* 1.65*  --   --  1.69*  LATICACIDVEN 2.2*  --  2.0*  --   --     Estimated Creatinine Clearance: 41.2 mL/min (A) (by C-G formula based on SCr of 1.69 mg/dL (H)).    Allergies  Allergen Reactions   Lisinopril Swelling    Angioedema- on this and afinitor same time   Simvastatin Other (See Comments)    Joint ache    Antimicrobials this admission: ceftriaxone 6/8 >>  azithromycin 6/8 >>  Vancomycin 6/8 >>   Dose adjustments this admission:    Microbiology results: 6/7 BCx: ngtd 6/7 UCx: 60k E. Faecalis   Thank you for allowing pharmacy to be a part of this patients care.  Royetta Asal, PharmD, BCPS 01/27/2020 4:06 PM

## 2020-01-27 NOTE — Progress Notes (Addendum)
HEMATOLOGY-ONCOLOGY PROGRESS NOTE  SUBJECTIVE: Reports that he is feeling better.  He is currently on room air.  O2 sats in the mid 90s.  He has no chest pain but has some mild shortness of breath.  Denies abdominal pain, nausea, vomiting.  No bleeding reported.  REVIEW OF SYSTEMS:   Constitutional: Denies fevers, chills Eyes: Denies blurriness of vision Ears, nose, mouth, throat, and face: Denies mucositis or sore throat Respiratory: Reports mild shortness of breath Cardiovascular: Denies palpitation, chest discomfort Gastrointestinal:  Denies nausea, heartburn or change in bowel habits Skin: Denies abnormal skin rashes Lymphatics: Denies new lymphadenopathy or easy bruising Neurological:Denies numbness, tingling or new weaknesses Behavioral/Psych: Mood is stable, no new changes  Extremities: No lower extremity edema All other systems were reviewed with the patient and are negative.  I have reviewed the past medical history, past surgical history, social history and family history with the patient and they are unchanged from previous note.   PHYSICAL EXAMINATION: ECOG PERFORMANCE STATUS: 1 - Symptomatic but completely ambulatory  Vitals:   01/27/20 0537 01/27/20 1134  BP: (!) 152/80   Pulse: 86   Resp: 17   Temp:    SpO2: 94% 95%   Filed Weights   01/26/20 0908  Weight: 77.1 kg    Intake/Output from previous day: 06/07 0701 - 06/08 0700 In: 1240 [P.O.:240; IV Piggyback:1000] Out: 850 [Urine:850]  GENERAL:alert, no distress and comfortable SKIN: skin color, texture, turgor are normal, no rashes or significant lesions EYES: normal, Conjunctiva are pink and non-injected, sclera clear OROPHARYNX:no exudate, no erythema and lips, buccal mucosa, and tongue normal  NECK: supple, thyroid normal size, non-tender, without nodularity LYMPH:  no palpable lymphadenopathy in the cervical, axillary or inguinal LUNGS: Diminished right lower base HEART: regular rate & rhythm and no  murmurs and no lower extremity edema ABDOMEN:abdomen soft, non-tender and normal bowel sounds Musculoskeletal:no cyanosis of digits and no clubbing  NEURO: alert & oriented x 3 with fluent speech, no focal motor/sensory deficits  LABORATORY DATA:  I have reviewed the data as listed CMP Latest Ref Rng & Units 01/27/2020 01/26/2020 01/26/2020  Glucose 70 - 99 mg/dL 112(H) - 165(H)  BUN 8 - 23 mg/dL 22 - 31(H)  Creatinine 0.61 - 1.24 mg/dL 1.69(H) 1.65(H) 1.73(H)  Sodium 135 - 145 mmol/L 138 - 138  Potassium 3.5 - 5.1 mmol/L 3.6 - 3.8  Chloride 98 - 111 mmol/L 106 - 103  CO2 22 - 32 mmol/L 22 - 24  Calcium 8.9 - 10.3 mg/dL 8.9 - 9.6  Total Protein 6.5 - 8.1 g/dL - - 7.9  Total Bilirubin 0.3 - 1.2 mg/dL - - 0.7  Alkaline Phos 38 - 126 U/L - - 60  AST 15 - 41 U/L - - 19  ALT 0 - 44 U/L - - 15    Lab Results  Component Value Date   WBC 10.0 01/27/2020   HGB 9.6 (L) 01/27/2020   HCT 28.0 (L) 01/27/2020   MCV 93.6 01/27/2020   PLT 184 01/27/2020   NEUTROABS 10.6 (H) 01/26/2020    DG Chest 2 View  Result Date: 01/26/2020 CLINICAL DATA:  Suspected sepsis EXAM: CHEST - 2 VIEW COMPARISON:  08/18/2019 CT FINDINGS: Asymmetric reticulation at the right lung base where there is also medial opacity blunting the cardiophrenic sulcus. Blunting at the posterior costophrenic sulci which could be from pleural fluid or a known left-sided fatty Bochdalek's hernia. Normal heart size. Emphysema. IMPRESSION: 1. New infiltrate at the right base. 2. Known  right lower lobe mass. Electronically Signed   By: Monte Fantasia M.D.   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  The patient was started on treatment with Afinitor 10 mg daily status post 2 months of treatment.  This was followed by reduction of his dose to 7.5 mg daily for 24  months.  He tolerated the lower dose much better.  The patient also underwent radio embolization of metastatic disease to the liver by IR on 01/09/2018.  The patient is now currently receiving treatment with Xeloda 750 mg/m twice a day for 14 days every 4 weeks in addition to Temodar 200 mg/m on days 10 through 14 every 4 weeks.  He started this treatment on approximately 11/03/2019.  He is status post 3 cycles.  Now admitted for sepsis secondary to community-acquired pneumonia.  He continues to slowly improve with IV antibiotics and bronchodilators.  Labs have been reviewed which show mild anemia and renal insufficiency consistent with baseline.  The patient is scheduled to start his next cycle of chemotherapy this weekend.  He is also scheduled for an outpatient CT scan of the chest, abdomen, pelvis later this week.  Will discuss further with Dr. [name] about whether the patient should delay his treatment due to his acute illness.  We will also discuss whether we need to delay CT scans until his acute illness resolves.   LOS: 1 day   Richard Bussing, DNP, AGPCNP-BC, AOCNP 01/27/20  ADDENDUM: Hematology/Oncology Attending: I had a face-to-face encounter with the patient today.  I recommended his care plan and I agree with the above note.  This is a very pleasant 74 years old African-American male diagnosed with low-grade neuroendocrine carcinoma involving the lung and liver in April 2018 status post systemic chemotherapy with Afinitor for more than 2 years discontinued secondary to disease progression.  The patient also had radioembolization to metastatic disease to the liver in May 2019.  He is currently undergoing systemic chemotherapy with Xeloda and Temodar status post 3 months of treatment.  He has been tolerating this treatment well with no concerning adverse effects. He was admitted with shortness of breath and community-acquired pneumonia.  The patient is currently on treatment with IV  antibiotics and bronchodilator and he started feeling much better. He is expected to be discharged home tomorrow. He is scheduled to have repeat CT scan of the chest, abdomen pelvis in 2 days for restaging of his disease.  He has a follow-up appointment with me next week for evaluation and discussion of his scan results and recommendation regarding his condition. For the renal insufficiency, the patient will continue with IV hydration for now and he was encouraged to increase his oral intake. Thank you so much for taking good care of Mr. Decock.  Please call if you have any questions.  Disclaimer: This note was dictated with voice recognition software. Similar sounding words can inadvertently be transcribed and may be missed upon review. Eilleen Kempf, MD

## 2020-01-28 ENCOUNTER — Telehealth: Payer: Self-pay | Admitting: Medical Oncology

## 2020-01-28 LAB — COMPREHENSIVE METABOLIC PANEL
ALT: 12 U/L (ref 0–44)
AST: 13 U/L — ABNORMAL LOW (ref 15–41)
Albumin: 3.5 g/dL (ref 3.5–5.0)
Alkaline Phosphatase: 47 U/L (ref 38–126)
Anion gap: 12 (ref 5–15)
BUN: 20 mg/dL (ref 8–23)
CO2: 21 mmol/L — ABNORMAL LOW (ref 22–32)
Calcium: 8.9 mg/dL (ref 8.9–10.3)
Chloride: 105 mmol/L (ref 98–111)
Creatinine, Ser: 1.67 mg/dL — ABNORMAL HIGH (ref 0.61–1.24)
GFR calc Af Amer: 46 mL/min — ABNORMAL LOW (ref >60)
GFR calc non Af Amer: 39 mL/min — ABNORMAL LOW (ref >60)
Glucose, Bld: 119 mg/dL — ABNORMAL HIGH (ref 70–99)
Potassium: 3.4 mmol/L — ABNORMAL LOW (ref 3.5–5.1)
Sodium: 138 mmol/L (ref 135–145)
Total Bilirubin: 0.7 mg/dL (ref 0.3–1.2)
Total Protein: 7.1 g/dL (ref 6.5–8.1)

## 2020-01-28 LAB — CBC
HCT: 29.1 % — ABNORMAL LOW (ref 39.0–52.0)
Hemoglobin: 9.9 g/dL — ABNORMAL LOW (ref 13.0–17.0)
MCH: 32 pg (ref 26.0–34.0)
MCHC: 34 g/dL (ref 30.0–36.0)
MCV: 94.2 fL (ref 80.0–100.0)
Platelets: 178 10*3/uL (ref 150–400)
RBC: 3.09 MIL/uL — ABNORMAL LOW (ref 4.22–5.81)
RDW: 18.7 % — ABNORMAL HIGH (ref 11.5–15.5)
WBC: 6.1 10*3/uL (ref 4.0–10.5)
nRBC: 0 % (ref 0.0–0.2)

## 2020-01-28 LAB — URINE CULTURE: Culture: 60000 — AB

## 2020-01-28 LAB — MAGNESIUM: Magnesium: 2 mg/dL (ref 1.7–2.4)

## 2020-01-28 LAB — GLUCOSE, CAPILLARY
Glucose-Capillary: 131 mg/dL — ABNORMAL HIGH (ref 70–99)
Glucose-Capillary: 112 mg/dL — ABNORMAL HIGH (ref 70–99)

## 2020-01-28 MED ORDER — AMOXICILLIN-POT CLAVULANATE 875-125 MG PO TABS
1.0000 | ORAL_TABLET | Freq: Two times a day (BID) | ORAL | 0 refills | Status: AC
Start: 2020-01-28 — End: 2020-01-31

## 2020-01-28 NOTE — Telephone Encounter (Signed)
Pt home from from hospital . Does Richard Navarro start his next round of chemo pills on sunday?

## 2020-01-28 NOTE — Progress Notes (Signed)
Reviewed AVS with patient and friend.   Clarified into regarding CT Abdomen that is scheduled for tomorrow outpatient.  We were told by Radiology Technician that the patient is to take the contrast that he was given prior to the film tomorrow.  He is not able to take IV contrast due to kidney function.  Answered all questions regarding the AVS.  Patient alert and oriented, skin warm and dry.  Transported to the main lobby via wheelchair with his friend and turned over into her care without concerns or complaints.

## 2020-01-28 NOTE — Plan of Care (Signed)

## 2020-01-28 NOTE — Discharge Summary (Signed)
Physician Discharge Summary  Richard Navarro:[id number] DOB: [birth year] DOA: 01/26/2020  PCP: [name], MD  Admit date: 01/26/2020 Discharge date: 01/28/2020  Admitted From: Home Disposition: Home  Recommendations for Outpatient Follow-up:  1. Follow up with PCP in 1-2 weeks 2. Please obtain BMP/CBC in one week your next doctors visit.  3. Oral Augmentin as prescribed X 3 days 4. Follow-up outpatient oncology.   Discharge Condition: Stable CODE STATUS: DNR Diet recommendation: Diabetic  Brief/Interim Summary: 74 year old with history of COPD/emphysema, CKD stage III, DM2, HTN, HLD, metastatic neuroendocrine carcinoma, PTSD, GERD, osteoarthritis, gout presented with progressive shortness of breath over past 3 days.  Upon admission he was noted to be septic concerns for community-acquired pneumonia started on Rocephin and azithromycin.  Eventually his urine cultures ended up growing Enterococcus which was pansensitive.  Medically doing much better, no more shortness of breath.  We will discharge him today in stable condition on oral antibiotics. No complaints doing well wants to go home.  Assessment & Plan:   Principal Problem:   Sepsis due to pneumonia [facility]) Active Problems:   Diabetes mellitus with renal manifestation ([place])   Hypertension associated with diabetes ([place])   Gout   Arthritis   GERD (gastroesophageal reflux disease)   COPD (chronic obstructive pulmonary disease) (HCC)   Neuroendocrine carcinoma (HCC)   Chronic kidney disease (CKD) stage G3b/A1, moderately decreased glomerular filtration rate (GFR) between 30-44 mL/min/1.73 square meter and albuminuria creatinine ratio less than 30 mg/g   Anemia   Recurrent major depressive disorder, in full remission ([place])  Sepsis in the setting of community-acquired pneumonia, POA.  Right lower base. Acute respiratory failure without hypoxiaor hypercarbia History of COPD without exacerbation -Procalcitonin elevated  at 8.0.  Doing well with transition to oral Augmentin.  Continue using home bronchodilators, incentive spirometer and flutter valve.  Urinary tract infection with Enterococcus faecalis -Augmentin should cover this as well.  CKD 3B at baseline -Patient is near his baseline creatinine of 1.7.  Closely continue to monitor this.  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,stable -Diagnosed in 2018, currently on outpatient chemotherapy with due to ongoing infection is currently on hold -Seen by oncology team during this hospital admission  Noninsulin-dependent             -Resume home meds    Discharge Diagnoses:  Principal Problem:   Sepsis due to pneumonia [facility]) Active Problems:   Diabetes mellitus with renal manifestation ([place])   Hypertension associated with diabetes ([place])   Gout   Arthritis   GERD (gastroesophageal reflux disease)   COPD (chronic obstructive pulmonary disease) ([place])   Neuroendocrine carcinoma (HCC)   Chronic kidney disease (CKD) stage G3b/A1, moderately decreased glomerular filtration rate (GFR) between 30-44 mL/min/1.73 square meter and albuminuria creatinine ratio less than 30 mg/g   Anemia   Recurrent major depressive disorder, in full remission [facility])    Consultations:  Oncology  Subjective: Feeling great no complaints denies any shortness of breath  Discharge Exam: Vitals:   01/28/20 0606 01/28/20 0811  BP: (!) 159/87   Pulse: 75   Resp: 17   Temp: 98.3 F (36.8 C)   SpO2: 97% 97%   Vitals:   01/27/20 2139 01/28/20 0230 01/28/20 0606 01/28/20 0811  BP: (!) 153/82  (!) 159/87   Pulse: 88  75   Resp: 17  17   Temp:   98.3 F (36.8 C)   TempSrc:   Oral   SpO2:  94% 96% 97% 97%  Weight:      Height:        General: Pt is alert, awake, not in acute distress Cardiovascular: RRR, S1/S2 +, no rubs, no gallops Respiratory: CTA bilaterally, no wheezing, no  rhonchi Abdominal: Soft, NT, ND, bowel sounds + Extremities: no edema, no cyanosis  Discharge Instructions   Allergies as of 01/28/2020      Reactions   Lisinopril Swelling   Angioedema- on this and afinitor same time   Simvastatin Other (See Comments)   Joint ache      Medication List    TAKE these medications   albuterol 108 (90 Base) MCG/ACT inhaler Commonly known as: VENTOLIN HFA Inhale 2 puffs into the lungs every 4 (four) hours as needed for wheezing or shortness of breath.   allopurinol 100 MG tablet Commonly known as: ZYLOPRIM TAKE 1 TABLET DAILY   amLODipine 10 MG tablet Commonly known as: NORVASC Take 1 tablet (10 mg total) by mouth daily.   amoxicillin-clavulanate 875-125 MG tablet Commonly known as: Augmentin Take 1 tablet by mouth every 12 (twelve) hours for 3 days.   calcium carbonate 1250 (500 Ca) MG chewable tablet Commonly known as: OS-CAL Chew 2 tablets by mouth 2 (two) times daily.   capecitabine 500 MG tablet Commonly known as: Xeloda 3 tablet p.o. twice daily for 14 days every 4 weeks. What changed:   how much to take  how to take this  when to take this  additional instructions   docusate sodium 100 MG capsule Commonly known as: COLACE Take 200 mg by mouth 2 (two) times daily as needed for mild constipation.   FreeStyle Libre 14 Day Sensor Misc USE TO CHECK BLOOD SUGAR AS DIRECTED AND CHANGE EVERY 14 DAYS   glipiZIDE 5 MG tablet Commonly known as: GLUCOTROL Take 1 tablet (5 mg total) by mouth daily before breakfast.   glucose blood test strip Use to test blood sugar twice a day   guaiFENesin 600 MG 12 hr tablet Commonly known as: MUCINEX Take 600 mg by mouth 2 (two) times daily.   Iron 325 (65 Fe) MG Tabs Take 1 tablet by mouth every other day.   ketotifen 0.025 % ophthalmic solution Commonly known as: ZADITOR Place 1 drop into both eyes daily as needed (allergies).   magic mouthwash w/lidocaine Soln Take 5 mLs by mouth  every 6 (six) hours as needed for mouth pain.   metoprolol tartrate 25 MG tablet Commonly known as: LOPRESSOR Take 0.5 tablets (12.5 mg total) by mouth 2 (two) times daily.   ondansetron 8 MG tablet Commonly known as: ZOFRAN Take 1 tablet (8 mg total) by mouth every 8 (eight) hours as needed for nausea or vomiting.   pantoprazole 40 MG tablet Commonly known as: Protonix Take 1 tablet (40 mg total) by mouth daily.   polyethylene glycol 17 g packet Commonly known as: MIRALAX / GLYCOLAX Take 17 g by mouth daily.   Spiriva Respimat 2.5 MCG/ACT Aers Generic drug: Tiotropium Bromide Monohydrate USE 2 INHALATIONS DAILY What changed: See the new instructions.   temozolomide 100 MG capsule Commonly known as: TEMODAR Four capsule p.o. daily from days 10,11,12,13 and 14 every 4 weeks.  May take on an empty stomach or at bedtime to decrease nausea & vomiting. What changed:   how much to take  how to take this  when to take this  additional instructions   vitamin B-12 1000 MCG tablet Commonly known as: CYANOCOBALAMIN Take 1,000 mcg  by mouth daily.   vitamin C 500 MG tablet Commonly known as: ASCORBIC ACID Take 500 mg by mouth daily.   Vitamin D3 25 MCG (1000 UT) Caps Take 1 capsule by mouth daily.      Follow-up Information    [name], MD. Schedule an appointment as soon as possible for a visit in 1 week(s).   Specialty: Family Medicine Contact information: [place] [address] [phone]          Allergies  Allergen Reactions  . Lisinopril Swelling    Angioedema- on this and afinitor same time  . Simvastatin Other (See Comments)    Joint ache    You were cared for by a hospitalist during your hospital stay. If you have any questions about your discharge medications or the care you received while you were in the hospital after you are discharged, you can call the unit and asked to speak with the hospitalist on call if the  hospitalist that took care of you is not available. Once you are discharged, your primary care physician will handle any further medical issues. Please note that no refills for any discharge medications will be authorized once you are discharged, as it is imperative that you return to your primary care physician (or establish a relationship with a primary care physician if you do not have one) for your aftercare needs so that they can reassess your need for medications and monitor your lab values.   Procedures/Studies: DG Chest 2 View  Result Date: 01/26/2020 CLINICAL DATA:  Suspected sepsis EXAM: CHEST - 2 VIEW COMPARISON:  08/18/2019 CT FINDINGS: Asymmetric reticulation at the right lung base where there is also medial opacity blunting the cardiophrenic sulcus. Blunting at the posterior costophrenic sulci which could be from pleural fluid or a known left-sided fatty Bochdalek's hernia. Normal heart size. Emphysema. IMPRESSION: 1. New infiltrate at the right base. 2. Known right lower lobe mass. Electronically Signed   By: Monte Fantasia M.D.   On: 01/26/2020 07:41      The results of significant diagnostics from this hospitalization (including imaging, microbiology, ancillary and laboratory) are listed below for reference.     Microbiology: Recent Results (from the past 240 hour(s))  Culture, blood (Routine x 2)     Status: None (Preliminary result)   Collection Time: 01/26/20  6:49 AM   Specimen: BLOOD RIGHT FOREARM  Result Value Ref Range Status   Specimen Description   Final    BLOOD RIGHT FOREARM Performed at [facility] [address], [place], [place] [address]    Special Requests   Final    BOTTLES DRAWN AEROBIC AND ANAEROBIC Blood Culture adequate volume Performed at [facility] [address], [place], [place] [address]    Culture   Final    NO GROWTH 2 DAYS Performed at [facility] [address], [place], [place] [address]     Report Status PENDING  Incomplete  Culture, blood (Routine x 2)     Status: None (Preliminary result)   Collection Time: 01/26/20  6:49 AM   Specimen: BLOOD LEFT FOREARM  Result Value Ref Range Status   Specimen Description   Final    BLOOD LEFT FOREARM Performed at [facility] [address], [place], [place] [address]    Special Requests   Final    BOTTLES DRAWN AEROBIC AND ANAEROBIC Blood Culture adequate volume Performed at [facility],  [place] [address], [place], [place] [address]    Culture   Final    NO GROWTH 2 DAYS Performed at [facility] [address] [place], [place] [address]    Report Status PENDING  Incomplete  Urine culture     Status: Abnormal   Collection Time: 01/26/20  6:53 AM   Specimen: In/Out Cath Urine  Result Value Ref Range Status   Specimen Description   Final    IN/OUT CATH URINE Performed at [facility] [address] [place], [place] [address]    Special Requests   Final    NONE Performed at [facility], [place] [address] [place], [place] [address]    Culture 60,000 COLONIES/mL ENTEROCOCCUS FAECALIS (A)  Final   Report Status 01/28/2020 FINAL  Final   Organism ID, Bacteria ENTEROCOCCUS FAECALIS (A)  Final      Susceptibility   Enterococcus faecalis - MIC*    AMPICILLIN <=2 SENSITIVE Sensitive     NITROFURANTOIN <=16 SENSITIVE Sensitive     VANCOMYCIN 2 SENSITIVE Sensitive     * 60,000 COLONIES/mL ENTEROCOCCUS FAECALIS  SARS Coronavirus 2 by RT PCR (hospital order, performed in [facility] hospital lab) Nasopharyngeal Nasopharyngeal Swab     Status: None   Collection Time: 01/26/20  6:55 AM   Specimen: Nasopharyngeal Swab  Result Value Ref Range Status   SARS Coronavirus 2 NEGATIVE NEGATIVE Final    Comment: (NOTE) SARS-CoV-2 target nucleic acids are NOT DETECTED. The SARS-CoV-2 RNA is generally detectable in upper and lower respiratory specimens  during the acute phase of infection. The lowest concentration of SARS-CoV-2 viral copies this assay can detect is 250 copies / mL. A negative result does not preclude SARS-CoV-2 infection and should not be used as the sole basis for treatment or other patient management decisions.  A negative result may occur with improper specimen collection / handling, submission of specimen other than nasopharyngeal swab, presence of viral mutation(s) within the areas targeted by this assay, and inadequate number of viral copies (<250 copies / mL). A negative result must be combined with clinical observations, patient history, and epidemiological information. Fact Sheet for Patients:   StrictlyIdeas.no Fact Sheet for Healthcare Providers: BankingDealers.co.za This test is not yet approved or cleared  by the [place] FDA and has been authorized for detection and/or diagnosis of SARS-CoV-2 by FDA under an Emergency Use Authorization (EUA).  This EUA will remain in effect (meaning this test can be used) for the duration of the COVID-19 declaration under Section 564(b)(1) of the Act, 21 U.S.C. section 360bbb-3(b)(1), unless the authorization is terminated or revoked sooner. Performed at [facility], [place] [address] [place], [place] [address]      Labs: BNP (last 3 results) Recent Labs    01/27/20 0630  BNP 240.9*   Basic Metabolic Panel: Recent Labs  Lab 01/26/20 0644 01/26/20 0835 01/27/20 0630 01/28/20 0620  NA 138  --  138 138  K 3.8  --  3.6 3.4*  CL 103  --  106 105  CO2 24  --  22 21*  GLUCOSE 165*  --  112* 119*  BUN 31*  --  22 20  CREATININE 1.73* 1.65* 1.69* 1.67*  CALCIUM 9.6  --  8.9 8.9  MG  --   --   --  2.0   Liver Function Tests: Recent Labs  Lab 01/26/20 0644 01/28/20 0620  AST 19 13*  ALT 15 12  ALKPHOS 60 47  BILITOT 0.7 0.7  PROT 7.9 7.1  ALBUMIN 4.2 3.5   No results for input(s):  LIPASE, AMYLASE in the last 168 hours. No results for input(s): AMMONIA in the last 168 hours. CBC: Recent Labs  Lab 01/26/20 0644 01/26/20 1223 01/27/20 0630 01/28/20 0620  WBC 11.6* 11.9* 10.0 6.1  NEUTROABS 10.6*  --   --   --   HGB 11.7* 10.2* 9.6* 9.9*  HCT 34.7* 29.5* 28.0* 29.1*  MCV 94.0 95.2 93.6 94.2  PLT 251 203 184 178   Cardiac Enzymes: No results for input(s): CKTOTAL, CKMB, CKMBINDEX, TROPONINI in the last 168 hours. BNP: Invalid input(s): POCBNP CBG: Recent Labs  Lab 01/27/20 0807 01/27/20 1213 01/27/20 1729 01/27/20 2135 01/28/20 0741  GLUCAP 103* 135* 131* 127* 112*   D-Dimer No results for input(s): DDIMER in the last 72 hours. Hgb A1c Recent Labs    01/26/20 0834  HGBA1C 6.4*   Lipid Profile No results for input(s): CHOL, HDL, LDLCALC, TRIG, CHOLHDL, LDLDIRECT in the last 72 hours. Thyroid function studies No results for input(s): TSH, T4TOTAL, T3FREE, THYROIDAB in the last 72 hours.  Invalid input(s): FREET3 Anemia work up No results for input(s): VITAMINB12, FOLATE, FERRITIN, TIBC, IRON, RETICCTPCT in the last 72 hours. Urinalysis    Component Value Date/Time   COLORURINE STRAW (A) 01/26/2020 0644   APPEARANCEUR CLEAR 01/26/2020 0644   LABSPEC 1.009 01/26/2020 0644   LABSPEC 1.015 04/26/2017 1452   PHURINE 7.0 01/26/2020 0644   GLUCOSEU NEGATIVE 01/26/2020 0644   GLUCOSEU Negative 04/26/2017 1452   HGBUR NEGATIVE 01/26/2020 0644   BILIRUBINUR NEGATIVE 01/26/2020 0644   BILIRUBINUR N 03/04/2018 1657   BILIRUBINUR Negative 04/26/2017 1452   KETONESUR NEGATIVE 01/26/2020 0644   PROTEINUR NEGATIVE 01/26/2020 0644   UROBILINOGEN 0.2 03/04/2018 1657   UROBILINOGEN 0.2 04/26/2017 1452   NITRITE NEGATIVE 01/26/2020 0644   LEUKOCYTESUR NEGATIVE 01/26/2020 0644   LEUKOCYTESUR Negative 04/26/2017 1452   Sepsis Labs Invalid input(s): PROCALCITONIN,  WBC,  LACTICIDVEN Microbiology Recent Results (from the past 240 hour(s))  Culture,  blood (Routine x 2)     Status: None (Preliminary result)   Collection Time: 01/26/20  6:49 AM   Specimen: BLOOD RIGHT FOREARM  Result Value Ref Range Status   Specimen Description   Final    BLOOD RIGHT FOREARM Performed at [facility], [place] [address], [place], [place] [address]    Special Requests   Final    BOTTLES DRAWN AEROBIC AND ANAEROBIC Blood Culture adequate volume Performed at [facility] [address], [place], [place] [address]    Culture   Final    NO GROWTH 2 DAYS Performed at [facility] [address], [place], [place] [address]    Report Status PENDING  Incomplete  Culture, blood (Routine x 2)     Status: None (Preliminary result)   Collection Time: 01/26/20  6:49 AM   Specimen: BLOOD LEFT FOREARM  Result Value Ref Range Status   Specimen Description   Final    BLOOD LEFT FOREARM Performed at [facility] [address], [place], [place] [address]    Special Requests   Final    BOTTLES DRAWN AEROBIC AND ANAEROBIC Blood Culture adequate volume Performed at [facility] [address], [place], [place] [address]    Culture   Final    NO GROWTH 2 DAYS Performed at [facility] [address]  [place], [place] [address]    Report Status PENDING  Incomplete  Urine culture     Status: Abnormal   Collection Time: 01/26/20  6:53 AM   Specimen: In/Out Cath Urine  Result Value Ref Range Status   Specimen Description   Final    IN/OUT CATH URINE Performed at [facility] [address], [place], [place] [address]    Special Requests   Final    NONE Performed at [facility], [place] [address], [place], [place] [address]    Culture 60,000 COLONIES/mL ENTEROCOCCUS FAECALIS (A)  Final   Report Status 01/28/2020 FINAL  Final   Organism ID, Bacteria ENTEROCOCCUS FAECALIS (A)  Final      Susceptibility   Enterococcus faecalis - MIC*     AMPICILLIN <=2 SENSITIVE Sensitive     NITROFURANTOIN <=16 SENSITIVE Sensitive     VANCOMYCIN 2 SENSITIVE Sensitive     * 60,000 COLONIES/mL ENTEROCOCCUS FAECALIS  SARS Coronavirus 2 by RT PCR (hospital order, performed in [facility] hospital lab) Nasopharyngeal Nasopharyngeal Swab     Status: None   Collection Time: 01/26/20  6:55 AM   Specimen: Nasopharyngeal Swab  Result Value Ref Range Status   SARS Coronavirus 2 NEGATIVE NEGATIVE Final    Comment: (NOTE) SARS-CoV-2 target nucleic acids are NOT DETECTED. The SARS-CoV-2 RNA is generally detectable in upper and lower respiratory specimens during the acute phase of infection. The lowest concentration of SARS-CoV-2 viral copies this assay can detect is 250 copies / mL. A negative result does not preclude SARS-CoV-2 infection and should not be used as the sole basis for treatment or other patient management decisions.  A negative result may occur with improper specimen collection / handling, submission of specimen other than nasopharyngeal swab, presence of viral mutation(s) within the areas targeted by this assay, and inadequate number of viral copies (<250 copies / mL). A negative result must be combined with clinical observations, patient history, and epidemiological information. Fact Sheet for Patients:   StrictlyIdeas.no Fact Sheet for Healthcare Providers: BankingDealers.co.za This test is not yet approved or cleared  by the [place] FDA and has been authorized for detection and/or diagnosis of SARS-CoV-2 by FDA under an Emergency Use Authorization (EUA).  This EUA will remain in effect (meaning this test can be used) for the duration of the COVID-19 declaration under Section 564(b)(1) of the Act, 21 U.S.C. section 360bbb-3(b)(1), unless the authorization is terminated or revoked sooner. Performed at [facility], [place] [address] [place], [place]  [address]      Time coordinating discharge:  I have spent 35 minutes face to face with the patient and on the ward discussing the patients care, assessment, plan and disposition with other care givers. >50% of the time was devoted counseling the patient about the risks and benefits of treatment/Discharge disposition and coordinating care.   SIGNED:   Damita Lack, MD  [facility] 01/28/2020, 10:27 AM   If 7PM-7AM, please contact night-coverage

## 2020-01-28 NOTE — Telephone Encounter (Signed)
No Delay by one week.

## 2020-01-28 NOTE — Telephone Encounter (Signed)
[name] notified.

## 2020-01-29 ENCOUNTER — Encounter (HOSPITAL_COMMUNITY): Payer: Self-pay

## 2020-01-29 ENCOUNTER — Ambulatory Visit (HOSPITAL_COMMUNITY)
Admission: RE | Admit: 2020-01-29 | Discharge: 2020-01-29 | Disposition: A | Discharge status: Home / Self Care | Payer: Medicare Other | Source: Ambulatory Visit | Attending: Physician Assistant | Admitting: Physician Assistant

## 2020-01-29 ENCOUNTER — Other Ambulatory Visit: Payer: Self-pay

## 2020-01-29 ENCOUNTER — Telehealth: Payer: Self-pay

## 2020-01-29 DIAGNOSIS — C7A8 Other malignant neuroendocrine tumors: Secondary | ICD-10-CM | POA: Insufficient documentation

## 2020-01-29 NOTE — Telephone Encounter (Signed)
Noted thanks °

## 2020-01-29 NOTE — Telephone Encounter (Signed)
PER HOSPITAL _________________  Admit date: 01/26/2020 Discharge date: 01/28/2020  Admitted From: Home Disposition: Home  Recommendations for Outpatient Follow-up:  1. Follow up with PCP in 1-2 weeks 2. Please obtain BMP/CBC in one week your next doctors visit.  3. Oral Augmentin as prescribed X 3 days 4. Follow-up outpatient oncology.   Discharge Condition: Stable   CODE STATUS: DNR Diet recommendation: Diabetic   PER Telephone Call ___________________  Transition Care Management Follow-up Telephone Call   Date discharged? 01/28/2020   How have you been since you were released from the hospital?  Patient has been feeling fatigued & having nausea due to Augmentin,informed to take with crackers or dry toast. Informed he can take the Zofran & try ginger ale. Patient had coughed up blood  once after being released from hospital very light, has resolved.   Do you understand why you were in the hospital? yes   Do you understand the discharge instructions? yes   Where were you discharged to? Home   Items Reviewed:  Medications reviewed: yes  Allergies reviewed: yes  Dietary changes reviewed: yes  Referrals reviewed: yes   Functional Questionnaire:   Activities of Daily Living (ADLs):   He states they are independent in the following: ambulation, bathing and hygiene, feeding, continence, grooming, toileting and dressing States they require assistance with the following: N/A   Any transportation issues/concerns?: no   Any patient concerns? no   Confirmed importance and date/time of follow-up visits scheduled yes  Provider Appointment booked with Dr.Hunter  Confirmed with patient if condition begins to worsen call PCP or go to the ER.  Patient was given the office number and encouraged to call back with question or concerns.  : yes

## 2020-01-31 LAB — CULTURE, BLOOD (ROUTINE X 2)
Culture: NO GROWTH
Culture: NO GROWTH
Special Requests: ADEQUATE
Special Requests: ADEQUATE
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
## 2020-02-02 ENCOUNTER — Other Ambulatory Visit: Payer: Self-pay

## 2020-02-02 ENCOUNTER — Telehealth: Payer: Self-pay | Admitting: Physician Assistant

## 2020-02-02 ENCOUNTER — Encounter: Payer: Self-pay | Admitting: Physician Assistant

## 2020-02-02 ENCOUNTER — Inpatient Hospital Stay: Payer: Medicare Other

## 2020-02-02 ENCOUNTER — Inpatient Hospital Stay: Payer: Medicare Other | Attending: Internal Medicine | Admitting: Physician Assistant

## 2020-02-02 VITALS — BP 144/82 | HR 89 | Temp 98.1°F | Resp 20 | Ht 74.0 in | Wt 179.2 lb

## 2020-02-02 DIAGNOSIS — Z7984 Long term (current) use of oral hypoglycemic drugs: Secondary | ICD-10-CM | POA: Insufficient documentation

## 2020-02-02 DIAGNOSIS — N189 Chronic kidney disease, unspecified: Secondary | ICD-10-CM | POA: Diagnosis not present

## 2020-02-02 DIAGNOSIS — C7A8 Other malignant neuroendocrine tumors: Secondary | ICD-10-CM

## 2020-02-02 DIAGNOSIS — I10 Essential (primary) hypertension: Secondary | ICD-10-CM | POA: Insufficient documentation

## 2020-02-02 DIAGNOSIS — E119 Type 2 diabetes mellitus without complications: Secondary | ICD-10-CM | POA: Diagnosis not present

## 2020-02-02 DIAGNOSIS — C7B02 Secondary carcinoid tumors of liver: Secondary | ICD-10-CM | POA: Insufficient documentation

## 2020-02-02 DIAGNOSIS — C7A090 Malignant carcinoid tumor of the bronchus and lung: Secondary | ICD-10-CM | POA: Insufficient documentation

## 2020-02-02 DIAGNOSIS — Z79899 Other long term (current) drug therapy: Secondary | ICD-10-CM | POA: Diagnosis not present

## 2020-02-02 DIAGNOSIS — E785 Hyperlipidemia, unspecified: Secondary | ICD-10-CM | POA: Insufficient documentation

## 2020-02-02 DIAGNOSIS — C3491 Malignant neoplasm of unspecified part of right bronchus or lung: Secondary | ICD-10-CM | POA: Diagnosis not present

## 2020-02-02 DIAGNOSIS — Z7189 Other specified counseling: Secondary | ICD-10-CM | POA: Diagnosis not present

## 2020-02-02 LAB — CMP (CANCER CENTER ONLY)
ALT: 10 U/L (ref 0–44)
AST: 11 U/L — ABNORMAL LOW (ref 15–41)
Albumin: 3.6 g/dL (ref 3.5–5.0)
Alkaline Phosphatase: 56 U/L (ref 38–126)
Anion gap: 12 (ref 5–15)
BUN: 20 mg/dL (ref 8–23)
CO2: 23 mmol/L (ref 22–32)
Calcium: 9.2 mg/dL (ref 8.9–10.3)
Chloride: 106 mmol/L (ref 98–111)
Creatinine: 1.84 mg/dL — ABNORMAL HIGH (ref 0.61–1.24)
GFR, Est AFR Am: 41 mL/min — ABNORMAL LOW (ref >60)
GFR, Estimated: 35 mL/min — ABNORMAL LOW (ref >60)
Glucose, Bld: 103 mg/dL — ABNORMAL HIGH (ref 70–99)
Potassium: 4 mmol/L (ref 3.5–5.1)
Sodium: 141 mmol/L (ref 135–145)
Total Bilirubin: 0.2 mg/dL — ABNORMAL LOW (ref 0.3–1.2)
Total Protein: 7.6 g/dL (ref 6.5–8.1)

## 2020-02-02 LAB — CBC WITH DIFFERENTIAL (CANCER CENTER ONLY)
Abs Immature Granulocytes: 0.02 10*3/uL (ref 0.00–0.07)
Basophils Absolute: 0 10*3/uL (ref 0.0–0.1)
Basophils Relative: 1 %
Eosinophils Absolute: 0.1 10*3/uL (ref 0.0–0.5)
Eosinophils Relative: 2 %
HCT: 32.8 % — ABNORMAL LOW (ref 39.0–52.0)
Hemoglobin: 11.1 g/dL — ABNORMAL LOW (ref 13.0–17.0)
Immature Granulocytes: 0 %
Lymphocytes Relative: 38 %
Lymphs Abs: 2.1 10*3/uL (ref 0.7–4.0)
MCH: 31.6 pg (ref 26.0–34.0)
MCHC: 33.8 g/dL (ref 30.0–36.0)
MCV: 93.4 fL (ref 80.0–100.0)
Monocytes Absolute: 0.5 10*3/uL (ref 0.1–1.0)
Monocytes Relative: 9 %
Neutro Abs: 2.8 10*3/uL (ref 1.7–7.7)
Neutrophils Relative %: 50 %
Platelet Count: 237 10*3/uL (ref 150–400)
RBC: 3.51 MIL/uL — ABNORMAL LOW (ref 4.22–5.81)
RDW: 17.9 % — ABNORMAL HIGH (ref 11.5–15.5)
WBC Count: 5.5 10*3/uL (ref 4.0–10.5)
nRBC: 0 % (ref 0.0–0.2)

## 2020-02-02 NOTE — Telephone Encounter (Signed)
Scheduled per 6/14 los. Printed avs and calendar for pt.

## 2020-02-03 ENCOUNTER — Ambulatory Visit (INDEPENDENT_AMBULATORY_CARE_PROVIDER_SITE_OTHER): Payer: Medicare Other | Admitting: Family Medicine

## 2020-02-03 ENCOUNTER — Encounter: Payer: Self-pay | Admitting: Family Medicine

## 2020-02-03 DIAGNOSIS — C3491 Malignant neoplasm of unspecified part of right bronchus or lung: Secondary | ICD-10-CM | POA: Diagnosis not present

## 2020-02-03 DIAGNOSIS — E1122 Type 2 diabetes mellitus with diabetic chronic kidney disease: Secondary | ICD-10-CM

## 2020-02-03 DIAGNOSIS — E1121 Type 2 diabetes mellitus with diabetic nephropathy: Secondary | ICD-10-CM

## 2020-02-03 DIAGNOSIS — N1832 Chronic kidney disease, stage 3b: Secondary | ICD-10-CM

## 2020-02-03 DIAGNOSIS — J189 Pneumonia, unspecified organism: Secondary | ICD-10-CM | POA: Diagnosis not present

## 2020-02-03 DIAGNOSIS — E1159 Type 2 diabetes mellitus with other circulatory complications: Secondary | ICD-10-CM

## 2020-02-03 DIAGNOSIS — I10 Essential (primary) hypertension: Secondary | ICD-10-CM

## 2020-02-03 DIAGNOSIS — J449 Chronic obstructive pulmonary disease, unspecified: Secondary | ICD-10-CM

## 2020-02-03 DIAGNOSIS — I152 Hypertension secondary to endocrine disorders: Secondary | ICD-10-CM

## 2020-02-03 DIAGNOSIS — A419 Sepsis, unspecified organism: Secondary | ICD-10-CM

## 2020-02-03 NOTE — Assessment & Plan Note (Signed)
Unfortunately area in the lung continues to grow despite chemotherapy (at least other areas in the body such as liver are stable) -we had a discussion today about radiation oncology-patient seems like he would lean toward treatment if made available.  He is awaiting referral scheduling radiation oncology

## 2020-02-03 NOTE — Assessment & Plan Note (Signed)
CKD stage III appears stable with labs just done yesterday with oncology.  GFR in the 30s.  Has follow-up with [facility] tomorrow.  Continue current medication

## 2020-02-03 NOTE — Assessment & Plan Note (Signed)
Well-controlled on amlodipine and metoprolol 12.5 mg twice daily-continue current medication

## 2020-02-03 NOTE — Assessment & Plan Note (Addendum)
Good control based off A1c but unfortunately patient has had some lows on glipizide 2.5 mg-we will discontinue medication.  Encourage regular meals.  We discussed blood sugar still low could use coke or similar like orange juice.  He also has glucose tablets. -Hold glipizide for now unless sugars always above 90 and frequently above 160 -I did request that I know about any lows under 70 so we can make a plan to address this

## 2020-02-03 NOTE — Progress Notes (Signed)
Phone [phone]   Subjective:  Richard Navarro is a 74 y.o. year old very pleasant male patient who presents for transitional care management and hospital follow up for Sepsis/CAP. Patient was hospitalized from 01/26/20 to 01/920. A TCM phone call was completed on 01/29/20. Medical complexity moderate  Discharge summary review: 74 year old male who presented to the hospital with progressive shortness of breath 3 days prior to admission.  Ultimately discovered to be septic related to community-acquired pneumonia and started on Rocephin and azithromycin eventually able to be weaned to Augmentin.  He also had a UTI with Enterococcus which was pansensitive-Augmentin also cover this. States did not have urinary symptoms. Had some nausea on augmentin but improved.   For sepsis in setting of community-acquired pneumonia as well as COPD Patient initially had pro calcitonin elevated at 8.0.  He did well with transition to oral Augmentin.  He was continued on home bronchodilators-albuterol as well as chronic Spiriva.  He was also recommended do incentive spirometry and flutter valve. Had several nebulizer treatments in hospital- states atually felt worse after- did cough up some blood but that has resolved.   Patient with chronic kidney disease stage III-creatinine was stable during hospitalization.  CMP was checked yesterday and creatinine was largely stable at 1.84 compared to 1.67 at discharge. 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  Patient diagnosed in 2018 and on outpatient chemotherapy.  Oncology saw him while hospitalized.  Patient has also seen oncology post hospitalization-they noted worsening of the size of the lung lesion-consultation with radiation oncology is planned.  Diabetes-drastic improvement in A1c during hospitalization.  He was continued on glipizide 5  mg once daily at discharge recently decreased from 10 mg last month. Home sugars have been lows- cut down to 2.5 mg and still having issues at 54 this AM- we are going to cut out the glipizide and only restart 2.5mg  if seeing a lot of readings above 160. #s have gone up with chemotherapy in the past. Glucose tablets on hand-  Lab Results  Component Value Date   HGBA1C 6.4 (H) 01/26/2020   HGBA1C 9.1 (A) 10/21/2019   HGBA1C 5.4 06/23/2019   Hypertension was controlled with amlodipine 10 mg and metoprolol 12.5 mg twice daily. Memory /attention somewhat shorter. Slight edema in feet- worse on right- no calf pain or significant worsening.   Today states having some fatigue, low energy, mild balance issues since discharge.    See problem oriented charting as well  Past Medical History-  Patient Active Problem List   Diagnosis Date Noted  . Anemia 05/07/2018    Priority: High  . Chronic kidney disease (CKD) stage G3b/A1, moderately decreased glomerular filtration rate (GFR) between 30-44 mL/min/1.73 square meter and albuminuria creatinine ratio less than 30 mg/g 03/04/2018    Priority: High  . Neuroendocrine carcinoma ([place]) 08/09/2017    Priority: High  . Primary cancer of right lung ([place]) 12/22/2016    Priority: High  . Diabetes mellitus with renal manifestation (HCC)     Priority: High  . Myalgia due to statin 03/04/2018    Priority: Medium  . COPD (chronic obstructive pulmonary disease) ([place]) 12/01/2016    Priority: Medium  . PTSD (post-traumatic stress disorder) 12/07/2015    Priority: Medium  . Erectile dysfunction 04/09/2014    Priority: Medium  . Former smoker 03/26/2014    Priority: Medium  . Hypertension associated with diabetes ([place])  Priority: Medium  . Gout     Priority: Medium  . Depression     Priority: Medium  . Hyperlipidemia     Priority: Medium  . Thrombocytopenia ([place])     Priority: Low  . Pneumonia 08/09/2017    Priority: Low  . Encounter for  antineoplastic chemotherapy 01/10/2017    Priority: Low  . Goals of care, counseling/discussion 01/10/2017    Priority: Low  . Prostate cancer screening 04/17/2014    Priority: Low  . Arthritis 03/26/2014    Priority: Low  . History of adenomatous polyp of colon 03/26/2014    Priority: Low  . GERD (gastroesophageal reflux disease)     Priority: Low  . Sepsis due to pneumonia ([place]) 01/26/2020  . Recurrent major depressive disorder, in full remission ([place]) 10/21/2019    Medications- reviewed and updated  A medical reconciliation was performed comparing current medicines to hospital discharge medications. Current Outpatient Medications  Medication Sig Dispense Refill  . albuterol (VENTOLIN HFA) 108 (90 Base) MCG/ACT inhaler Inhale 2 puffs into the lungs every 4 (four) hours as needed for wheezing or shortness of breath. 3 Inhaler 2  . allopurinol (ZYLOPRIM) 100 MG tablet TAKE 1 TABLET DAILY (Patient taking differently: Take 100 mg by mouth daily. ) 90 tablet 3  . amLODipine (NORVASC) 10 MG tablet Take 1 tablet (10 mg total) by mouth daily. 90 tablet 3  . calcium carbonate (OS-CAL) 1250 (500 Ca) MG chewable tablet Chew 2 tablets by mouth 2 (two) times daily.    . capecitabine (XELODA) 500 MG tablet 3 tablet p.o. twice daily for 14 days every 4 weeks. (Patient taking differently: Take 1,500 mg by mouth See admin instructions. Twice daily for 14 days every 4 weeks.) 84 tablet 2  . Cholecalciferol (VITAMIN D3) 25 MCG (1000 UT) CAPS Take 1 capsule by mouth daily.    . Continuous Blood Gluc Sensor (FREESTYLE LIBRE 14 DAY SENSOR) MISC USE TO CHECK BLOOD SUGAR AS DIRECTED AND CHANGE EVERY 14 DAYS 2 each 5  . docusate sodium (COLACE) 100 MG capsule Take 200 mg by mouth 2 (two) times daily as needed for mild constipation.    . Ferrous Sulfate (IRON) 325 (65 Fe) MG TABS Take 1 tablet by mouth every other day.     Marland Kitchen glipiZIDE (GLUCOTROL) 5 MG tablet Take 1 tablet (5 mg total) by mouth daily before  breakfast. 90 tablet 3  . glucose blood test strip Use to test blood sugar twice a day 200 each 3  . guaiFENesin (MUCINEX) 600 MG 12 hr tablet Take 600 mg by mouth 2 (two) times daily.     Marland Kitchen ketotifen (ZADITOR) 0.025 % ophthalmic solution Place 1 drop into both eyes daily as needed (allergies).    . magic mouthwash w/lidocaine SOLN Take 5 mLs by mouth every 6 (six) hours as needed for mouth pain.    . metoprolol tartrate (LOPRESSOR) 25 MG tablet Take 0.5 tablets (12.5 mg total) by mouth 2 (two) times daily. 90 tablet 3  . ondansetron (ZOFRAN) 8 MG tablet Take 1 tablet (8 mg total) by mouth every 8 (eight) hours as needed for nausea or vomiting. 30 tablet 0  . pantoprazole (PROTONIX) 40 MG tablet Take 1 tablet (40 mg total) by mouth daily. 90 tablet 3  . polyethylene glycol (MIRALAX / GLYCOLAX) 17 g packet Take 17 g by mouth daily.    Marland Kitchen SPIRIVA RESPIMAT 2.5 MCG/ACT AERS USE 2 INHALATIONS DAILY (Patient taking differently: Inhale 2 puffs into  the lungs daily. ) 12 g 3  . temozolomide (TEMODAR) 100 MG capsule Four capsule p.o. daily from days 10,11,12,13 and 14 every 4 weeks.  May take on an empty stomach or at bedtime to decrease nausea & vomiting. (Patient taking differently: Take 400 mg by mouth See admin instructions. Daily from days 10,11,12,13 and 14 every 4 weeks.  May take on an empty stomach or at bedtime to decrease nausea & vomiting.) 20 capsule 3  . vitamin B-12 (CYANOCOBALAMIN) 1000 MCG tablet Take 1,000 mcg by mouth daily.     . vitamin C (ASCORBIC ACID) 500 MG tablet Take 500 mg by mouth daily.     No current facility-administered medications for this visit.   Objective  Objective:  BP 130/70   Pulse 64   Temp 98.3 F (36.8 C)   Ht 6\' 2"  (1.88 m)   Wt 180 lb 9.6 oz (81.9 kg)   SpO2 98%   BMI 23.19 kg/m  Gen: NAD, resting comfortably CV: RRR no murmurs rubs or gallops Lungs: CTAB no crackles, wheeze, rhonchi Abdomen: soft/nontender/nondistended/normal bowel sounds. No rebound  or guarding.  Ext: trace edema Skin: warm, dry Neuro: grossly normal, moves all extremities    Assessment and Plan:     Sepsis due to pneumonia [facility]) Patient appears to have significantly recovered from recent pneumonia.  He has completed his course of Augmentin.  This also covered UTI with Enterococcus which was pansensitive.  Initially treatment was with Rocephin and azithromycin.  We discussed potential for slow recovery after recent hospitalization with baseline battle with cancer-may take several months to feel back to his baseline regards to fatigue.  If he has new or worsening symptoms should seek care immediately such as shortness of breath or recurrent confusion  Primary cancer of right lung ([place]) Unfortunately area in the lung continues to grow despite chemotherapy (at least other areas in the body such as liver are stable) -we had a discussion today about radiation oncology-patient seems like he would lean toward treatment if made available.  He is awaiting referral scheduling radiation oncology  Diabetes mellitus with renal manifestation ([place]) Good control based off A1c but unfortunately patient has had some lows on glipizide 2.5 mg-we will discontinue medication.  Encourage regular meals.  We discussed blood sugar still low could use coke or similar like orange juice.  He also has glucose tablets. -Hold glipizide for now unless sugars always above 90 and frequently above 160 -I did request that I know about any lows under 70 so we can make a plan to address this  Chronic kidney disease (CKD) stage G3b/A1, moderately decreased glomerular filtration rate (GFR) between 30-44 mL/min/1.73 square meter and albuminuria creatinine ratio less than 30 mg/g CKD stage III appears stable with labs just done yesterday with oncology.  GFR in the 30s.  Has follow-up with [facility] tomorrow.  Continue current medication  Hypertension associated with diabetes ([place]) Well-controlled on  amlodipine and metoprolol 12.5 mg twice daily-continue current medication  COPD (chronic obstructive pulmonary disease) ([place]) Compliant with Spiriva and has albuterol on hand.  He was anxious due to feeling like symptoms worsen after nebulizer treatment in the hospital-we opted to monitor at present as seems to be improving and continue current medication   Recommended follow up: Keep August follow-up to check back in Future Appointments  Date Time Provider [place]  03/03/2020 10:30 AM [facility] LAB 4 [facility] None  03/03/2020 11:00 AM Heilingoetter, Cassandra L, PA-C [facility] None  03/22/2020  3:00 PM [name], MD [facility]  04/14/2020 11:00 AM Marzetta Board, DPM [facility]    Lab/Order associations:   ICD-10-CM   1. Sepsis due to pneumonia ([place])  J18.9    A41.9   2. Primary cancer of right lung (HCC)  C34.91   3. Type 2 diabetes mellitus with stage 3b chronic kidney disease, without long-term current use of insulin (HCC)  E11.21    N18.32   4. Chronic kidney disease (CKD) stage G3b/A1, moderately decreased glomerular filtration rate (GFR) between 30-44 mL/min/1.73 square meter and albuminuria creatinine ratio less than 30 mg/g  N18.32   5. Hypertension associated with diabetes ([place])  E11.59    I10   6. Chronic obstructive pulmonary disease, unspecified COPD type ([place])  J44.9     Return precautions advised.  [name], MD

## 2020-02-03 NOTE — Assessment & Plan Note (Signed)
Patient appears to have significantly recovered from recent pneumonia.  He has completed his course of Augmentin.  This also covered UTI with Enterococcus which was pansensitive.  Initially treatment was with Rocephin and azithromycin.  We discussed potential for slow recovery after recent hospitalization with baseline battle with cancer-may take several months to feel back to his baseline regards to fatigue.  If he has new or worsening symptoms should seek care immediately such as shortness of breath or recurrent confusion

## 2020-02-03 NOTE — Patient Instructions (Addendum)
Health Maintenance Due  Topic Date Due  . COLONOSCOPY -holding off with ongoing cancer treatment 05/25/2019   we are going to cut out the glipizide and only restart 2.5mg  if seeing a lot of readings above 160 as long as no sugars under 90. Let me know if any sugars below 70.

## 2020-02-03 NOTE — Assessment & Plan Note (Signed)
Compliant with Spiriva and has albuterol on hand.  He was anxious due to feeling like symptoms worsen after nebulizer treatment in the hospital-we opted to monitor at present as seems to be improving and continue current medication

## 2020-02-06 ENCOUNTER — Encounter: Payer: Self-pay | Admitting: Family Medicine

## 2020-02-12 ENCOUNTER — Other Ambulatory Visit: Payer: Self-pay | Admitting: Interventional Radiology

## 2020-02-12 DIAGNOSIS — C7A8 Other malignant neuroendocrine tumors: Secondary | ICD-10-CM

## 2020-02-16 NOTE — Progress Notes (Signed)
Thoracic Location of Tumor / Histology: Stage IV low-grade neuroendocrine carcinoma, carcinoid tumor involving the lung and liver diagnosed in April 2018.  Patient presented for restaging scans  CT CAP 01/29/2020: Dominant 6.7 cm peripheral right lower lobe lung mass is increased since 08/18/2019 chest CT. Central right lower lobe 3.4 cm lung mass is stable.  Otherwise no new or progressive metastatic disease. Mild subcarinal lymphadenopathy is stable. Solitary segment 7 right liver mass is stable. Scattered sclerotic bone lesions are stable.  Biopsies of Liver 01/04/2017   Tobacco/Marijuana/Snuff/ETOH use: Former Smoker, quit in 2018  Past/Anticipated interventions by cardiothoracic surgery, if any:   Past/Anticipated interventions by medical oncology, if any:  PA Heilingoetter 02/02/2020 -Dr. [name] recommends that the patient continue on the same treatment at the same dose for now. He will start cycle #4 today. For the enlarging right lower lobe mass, we will place a referral to radiation oncology for consideration of radiotherapy to this enlarging lesion.  CURRENT THERAPY:  1) Systemic chemotherapy with Xeloda 750 mg/M2 twice daily for 14 days every 4 weeks in addition to Temodar 200 mg/M2 for 5 days (days 10-14) every 4 weeks.Hecompleted3cycles. First dose around 11/03/2019 2) Referral to radiation oncology for enlarging right lower lobe lesions. Start date pending.  PRIOR THERAPY: 1) Status post particle embolization of segment 7 of the metastatic neuroendocrine carcinoma of the liver by interventional radiology on 01/09/2018 under the care of Dr. Laurence Ferrari. 2) Afinitor (Everolimus) 10 mg by mouth daily. First dose started 01/22/2017. Status post 2 months of treatment. He is currently on treatment with Afinitor 7.5 mg by mouth daily. Status post 24 months. His treatment is currently on hold since September 2020 secondary to renal insufficiency. This treatment was discontinued on  March 10th 2021 secondary to renal insufficiency as well as disease progression.   Signs/Symptoms  Weight changes, if any: Lost about 27 pounds since this began and has since gained it back.  Respiratory complaints, if any: has SOB with prolonged activity.  Hemoptysis, if any: Occasional coughing with clear mucous.  Pain issues, if any:  No  SAFETY ISSUES:  Prior radiation? No  Pacemaker/ICD? No  Possible current pregnancy? n/a  Is the patient on methotrexate?   Current Complaints / other details:

## 2020-02-17 ENCOUNTER — Ambulatory Visit
Admission: RE | Admit: 2020-02-17 | Discharge: 2020-02-17 | Disposition: A | Discharge status: Home / Self Care | Payer: Medicare Other | Source: Ambulatory Visit | Attending: Radiation Oncology | Admitting: Radiation Oncology

## 2020-02-17 ENCOUNTER — Other Ambulatory Visit: Payer: Self-pay

## 2020-02-17 ENCOUNTER — Encounter: Payer: Self-pay | Admitting: Radiation Oncology

## 2020-02-17 VITALS — Ht 75.0 in | Wt 180.0 lb

## 2020-02-17 DIAGNOSIS — C3491 Malignant neoplasm of unspecified part of right bronchus or lung: Secondary | ICD-10-CM

## 2020-02-17 DIAGNOSIS — C3431 Malignant neoplasm of lower lobe, right bronchus or lung: Secondary | ICD-10-CM

## 2020-02-17 DIAGNOSIS — C7A8 Other malignant neuroendocrine tumors: Secondary | ICD-10-CM
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
## 2020-02-18 ENCOUNTER — Ambulatory Visit
Admission: RE | Admit: 2020-02-18 | Discharge: 2020-02-18 | Disposition: A | Discharge status: Home / Self Care | Payer: Medicare Other | Source: Ambulatory Visit | Attending: Radiation Oncology | Admitting: Radiation Oncology

## 2020-02-18 ENCOUNTER — Encounter: Payer: Self-pay | Admitting: General Practice

## 2020-02-18 ENCOUNTER — Other Ambulatory Visit: Payer: Self-pay

## 2020-02-18 ENCOUNTER — Encounter: Payer: Self-pay | Admitting: Family Medicine

## 2020-02-18 DIAGNOSIS — C3431 Malignant neoplasm of lower lobe, right bronchus or lung: Secondary | ICD-10-CM | POA: Diagnosis not present

## 2020-02-18 DIAGNOSIS — C7A1 Malignant poorly differentiated neuroendocrine tumors: Secondary | ICD-10-CM | POA: Insufficient documentation

## 2020-02-18 NOTE — Progress Notes (Signed)
[place] Initial Psychosocial Assessment Clinical Social Work  Clinical Social Work contacted by phone to assess psychosocial, emotional, mental health, and spiritual needs of the patient.   Barriers to care/review of distress screen:  - Transportation:  Do you anticipate any problems getting to appointments?  Do you have someone who can help run errands for you if you need it?  Has transpiration from his significant other, no concerns - Help at home:  What is your living situation (alone, family, other)?  If you are physically unable to care for yourself, who would you call on to help you?  Lives w significant other.  She is able to help as needed.  He is determined and able to care for himself, "my constitution is very strong." - Support system:  What does your support system look like?  Who would you call on if you needed some kind of practical help?  What if you needed someone to talk to for emotional support?  Significant other is very involved, has two children but does not communicate w them about his health, does not want to put his problems on them.  Likes to independent and self sufficient - Finances:  Are you concerned about finances.  Considering returning to work?  If not, applying for disability?  Is former [employer].  Is doing well financially, has insurance.    What is your understanding of where you are with your cancer? Its cause?  Your treatment plan and what happens next? Diagnosed w stage IV lung cancer attached to liver per patient.  About to start radiation, is also on oral chemotherapy.  Found out he had cancer.  Was going to [place] for medical care for last 24 years, significant other was concerned because "they had only taken one chest X-ray."  Was exposed to [place] - initially developed hypertension at age 21, then enlarged lymph nodes, then Type 2 Diabetes.  "Every time I go they found something else" - went to another MD in [place] - Dr Yong Channel who recommended pulmonary exam at  pulmonary center.  After scans, found out he had Stage IV lung cancer and was referred to Dr [name] in 2018.  Has been on new treatment regimen since that time.  Has been under treatment for cancer for past 3 years.  "Have had sepsis twice, pneumonia four times, kept recurring every 6 months."  Has had hospitalizations for lung issues/pneumonia during course of treatment.       If Distress Screen is positive for depression, insert PHQ   If Distress Screen is positive for anxiety, insert GAD 7  What are your worries for the future as you begin treatment for cancer?  No worries about entering the next phase of his treatment for radiation therapy, confident he has the strength and resources to meet the challenge.  CSW Summary:  Patient and family psychosocial functioning including strengths, limitations, and coping skills: 74 year old male, diagnosed w "lung cancer attached to my liver", retired Company secretary, good support from his "better half" who accompanies him to appointments and is fully engaged.  Wants to be active and independent, does not like to depend on others.  Does not want to burden his children w his health concerns.  Uses his [place] experience to work through any challenges in dealing w cancer.  No concerns at this time.  He is aware of the [place] and can contact us as needed for help/resources.  Identifications of barriers to care:  None noted  Availability of community resources:  None needed  Clinical Social Worker follow up needed: No.   Edwyna Shell, [place] Social Worker Phone:  [phone]

## 2020-02-18 NOTE — Progress Notes (Signed)
[facility] Psychosocial Distress Screening Clinical Social Work  Clinical Social Work was referred by distress screening protocol.  The patient scored a 8 on the Psychosocial Distress Thermometer which indicates moderate distress. Clinical Social Worker contacted patient by phone to assess for distress and other psychosocial needs.   Unable to reach patient, left generic VM as message was not personally identified.  Will await a return call.  ONCBCN DISTRESS SCREENING 02/17/2020  Screening Type Initial Screening  Distress experienced in past week (1-10) 8  Family Problem type Children    Clinical Social Worker follow up needed: Yes.    Call again tomorrow.  If yes, follow up plan:  Beverely Pace, [place], [place] Social Worker Phone:  [phone] Cell:  [phone]

## 2020-02-22 DIAGNOSIS — C3431 Malignant neoplasm of lower lobe, right bronchus or lung: Secondary | ICD-10-CM | POA: Diagnosis not present

## 2020-02-22 DIAGNOSIS — C7A1 Malignant poorly differentiated neuroendocrine tumors: Secondary | ICD-10-CM | POA: Insufficient documentation

## 2020-02-25 ENCOUNTER — Encounter: Payer: Self-pay | Admitting: *Deleted

## 2020-02-25 ENCOUNTER — Ambulatory Visit
Admission: RE | Admit: 2020-02-25 | Discharge: 2020-02-25 | Disposition: A | Discharge status: Home / Self Care | Payer: Medicare Other | Source: Ambulatory Visit | Attending: Interventional Radiology | Admitting: Interventional Radiology

## 2020-02-25 ENCOUNTER — Ambulatory Visit
Admission: RE | Admit: 2020-02-25 | Discharge: 2020-02-25 | Disposition: A | Discharge status: Home / Self Care | Payer: Medicare Other | Source: Ambulatory Visit | Attending: Radiation Oncology | Admitting: Radiation Oncology

## 2020-02-25 ENCOUNTER — Other Ambulatory Visit: Payer: Self-pay

## 2020-02-25 DIAGNOSIS — C7B8 Other secondary neuroendocrine tumors: Secondary | ICD-10-CM

## 2020-02-25 DIAGNOSIS — C3431 Malignant neoplasm of lower lobe, right bronchus or lung: Secondary | ICD-10-CM | POA: Diagnosis not present

## 2020-02-25 DIAGNOSIS — C7A8 Other malignant neuroendocrine tumors: Secondary | ICD-10-CM

## 2020-02-25 DIAGNOSIS — C7802 Secondary malignant neoplasm of left lung: Secondary | ICD-10-CM | POA: Diagnosis not present

## 2020-02-25 DIAGNOSIS — Z9889 Other specified postprocedural states: Secondary | ICD-10-CM | POA: Diagnosis not present

## 2020-02-25 DIAGNOSIS — C7A1 Malignant poorly differentiated neuroendocrine tumors: Secondary | ICD-10-CM | POA: Diagnosis not present

## 2020-02-25 DIAGNOSIS — C7A090 Malignant carcinoid tumor of the bronchus and lung: Secondary | ICD-10-CM | POA: Diagnosis not present

## 2020-02-25 HISTORY — PX: IR RADIOLOGIST EVAL & MGMT: IMG5224
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
## 2020-02-26 ENCOUNTER — Ambulatory Visit
Admission: RE | Admit: 2020-02-26 | Discharge: 2020-02-26 | Disposition: A | Discharge status: Home / Self Care | Payer: Medicare Other | Source: Ambulatory Visit | Attending: Radiation Oncology | Admitting: Radiation Oncology

## 2020-02-26 ENCOUNTER — Other Ambulatory Visit: Payer: Self-pay

## 2020-02-26 DIAGNOSIS — C3431 Malignant neoplasm of lower lobe, right bronchus or lung: Secondary | ICD-10-CM | POA: Diagnosis not present

## 2020-02-26 DIAGNOSIS — C7A1 Malignant poorly differentiated neuroendocrine tumors: Secondary | ICD-10-CM | POA: Diagnosis not present

## 2020-02-27 ENCOUNTER — Ambulatory Visit
Admission: RE | Admit: 2020-02-27 | Discharge: 2020-02-27 | Disposition: A | Discharge status: Home / Self Care | Payer: Medicare Other | Source: Ambulatory Visit | Attending: Radiation Oncology | Admitting: Radiation Oncology

## 2020-02-27 ENCOUNTER — Other Ambulatory Visit: Payer: Self-pay

## 2020-02-27 DIAGNOSIS — C7A1 Malignant poorly differentiated neuroendocrine tumors: Secondary | ICD-10-CM | POA: Diagnosis not present

## 2020-02-27 DIAGNOSIS — C3431 Malignant neoplasm of lower lobe, right bronchus or lung: Secondary | ICD-10-CM | POA: Diagnosis not present

## 2020-03-01 ENCOUNTER — Ambulatory Visit
Admission: RE | Admit: 2020-03-01 | Discharge: 2020-03-01 | Disposition: A | Discharge status: Home / Self Care | Payer: Medicare Other | Source: Ambulatory Visit | Attending: Radiation Oncology | Admitting: Radiation Oncology

## 2020-03-01 ENCOUNTER — Other Ambulatory Visit: Payer: Self-pay

## 2020-03-01 DIAGNOSIS — C3431 Malignant neoplasm of lower lobe, right bronchus or lung: Secondary | ICD-10-CM | POA: Diagnosis not present

## 2020-03-01 DIAGNOSIS — C7A1 Malignant poorly differentiated neuroendocrine tumors: Secondary | ICD-10-CM | POA: Diagnosis not present
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
## 2020-03-02 ENCOUNTER — Other Ambulatory Visit: Payer: Self-pay | Admitting: Internal Medicine

## 2020-03-02 ENCOUNTER — Other Ambulatory Visit: Payer: Self-pay

## 2020-03-02 ENCOUNTER — Ambulatory Visit
Admission: RE | Admit: 2020-03-02 | Discharge: 2020-03-02 | Disposition: A | Discharge status: Home / Self Care | Payer: Medicare Other | Source: Ambulatory Visit | Attending: Radiation Oncology | Admitting: Radiation Oncology

## 2020-03-02 DIAGNOSIS — C3431 Malignant neoplasm of lower lobe, right bronchus or lung: Secondary | ICD-10-CM | POA: Diagnosis not present

## 2020-03-02 DIAGNOSIS — R112 Nausea with vomiting, unspecified: Secondary | ICD-10-CM

## 2020-03-02 DIAGNOSIS — C7A8 Other malignant neuroendocrine tumors: Secondary | ICD-10-CM

## 2020-03-02 DIAGNOSIS — C7A1 Malignant poorly differentiated neuroendocrine tumors: Secondary | ICD-10-CM | POA: Diagnosis not present

## 2020-03-03 ENCOUNTER — Ambulatory Visit
Admission: RE | Admit: 2020-03-03 | Discharge: 2020-03-03 | Disposition: A | Discharge status: Home / Self Care | Payer: Medicare Other | Source: Ambulatory Visit | Attending: Radiation Oncology | Admitting: Radiation Oncology

## 2020-03-03 ENCOUNTER — Inpatient Hospital Stay: Payer: Medicare Other

## 2020-03-03 ENCOUNTER — Other Ambulatory Visit: Payer: Self-pay

## 2020-03-03 ENCOUNTER — Inpatient Hospital Stay: Payer: Medicare Other | Attending: Internal Medicine | Admitting: Physician Assistant

## 2020-03-03 ENCOUNTER — Telehealth: Payer: Self-pay | Admitting: Physician Assistant

## 2020-03-03 VITALS — BP 141/79 | HR 60 | Temp 97.9°F | Resp 17 | Ht 75.0 in | Wt 182.1 lb

## 2020-03-03 DIAGNOSIS — E785 Hyperlipidemia, unspecified: Secondary | ICD-10-CM | POA: Insufficient documentation

## 2020-03-03 DIAGNOSIS — C7A090 Malignant carcinoid tumor of the bronchus and lung: Secondary | ICD-10-CM | POA: Insufficient documentation

## 2020-03-03 DIAGNOSIS — N189 Chronic kidney disease, unspecified: Secondary | ICD-10-CM | POA: Insufficient documentation

## 2020-03-03 DIAGNOSIS — J439 Emphysema, unspecified: Secondary | ICD-10-CM | POA: Diagnosis not present

## 2020-03-03 DIAGNOSIS — C3431 Malignant neoplasm of lower lobe, right bronchus or lung: Secondary | ICD-10-CM | POA: Diagnosis not present

## 2020-03-03 DIAGNOSIS — Z79899 Other long term (current) drug therapy: Secondary | ICD-10-CM | POA: Insufficient documentation

## 2020-03-03 DIAGNOSIS — I10 Essential (primary) hypertension: Secondary | ICD-10-CM | POA: Diagnosis not present

## 2020-03-03 DIAGNOSIS — E119 Type 2 diabetes mellitus without complications: Secondary | ICD-10-CM | POA: Insufficient documentation

## 2020-03-03 DIAGNOSIS — Z5111 Encounter for antineoplastic chemotherapy: Secondary | ICD-10-CM

## 2020-03-03 DIAGNOSIS — Z7984 Long term (current) use of oral hypoglycemic drugs: Secondary | ICD-10-CM | POA: Diagnosis not present

## 2020-03-03 DIAGNOSIS — C7B02 Secondary carcinoid tumors of liver: Secondary | ICD-10-CM | POA: Insufficient documentation

## 2020-03-03 DIAGNOSIS — C7A1 Malignant poorly differentiated neuroendocrine tumors: Secondary | ICD-10-CM | POA: Diagnosis not present

## 2020-03-03 DIAGNOSIS — K219 Gastro-esophageal reflux disease without esophagitis: Secondary | ICD-10-CM | POA: Insufficient documentation

## 2020-03-03 DIAGNOSIS — C7A8 Other malignant neuroendocrine tumors: Secondary | ICD-10-CM

## 2020-03-03 LAB — CBC WITH DIFFERENTIAL (CANCER CENTER ONLY)
Abs Immature Granulocytes: 0.01 10*3/uL (ref 0.00–0.07)
Basophils Absolute: 0 10*3/uL (ref 0.0–0.1)
Basophils Relative: 0 %
Eosinophils Absolute: 0 10*3/uL (ref 0.0–0.5)
Eosinophils Relative: 1 %
HCT: 33.4 % — ABNORMAL LOW (ref 39.0–52.0)
Hemoglobin: 11.4 g/dL — ABNORMAL LOW (ref 13.0–17.0)
Immature Granulocytes: 0 %
Lymphocytes Relative: 22 %
Lymphs Abs: 0.8 10*3/uL (ref 0.7–4.0)
MCH: 31.9 pg (ref 26.0–34.0)
MCHC: 34.1 g/dL (ref 30.0–36.0)
MCV: 93.6 fL (ref 80.0–100.0)
Monocytes Absolute: 0.3 10*3/uL (ref 0.1–1.0)
Monocytes Relative: 10 %
Neutro Abs: 2.4 10*3/uL (ref 1.7–7.7)
Neutrophils Relative %: 67 %
Platelet Count: 190 10*3/uL (ref 150–400)
RBC: 3.57 MIL/uL — ABNORMAL LOW (ref 4.22–5.81)
RDW: 16.2 % — ABNORMAL HIGH (ref 11.5–15.5)
WBC Count: 3.6 10*3/uL — ABNORMAL LOW (ref 4.0–10.5)
nRBC: 0 % (ref 0.0–0.2)

## 2020-03-03 LAB — CMP (CANCER CENTER ONLY)
ALT: 12 U/L (ref 0–44)
AST: 13 U/L — ABNORMAL LOW (ref 15–41)
Albumin: 3.6 g/dL (ref 3.5–5.0)
Alkaline Phosphatase: 53 U/L (ref 38–126)
Anion gap: 9 (ref 5–15)
BUN: 25 mg/dL — ABNORMAL HIGH (ref 8–23)
CO2: 26 mmol/L (ref 22–32)
Calcium: 9.3 mg/dL (ref 8.9–10.3)
Chloride: 106 mmol/L (ref 98–111)
Creatinine: 1.7 mg/dL — ABNORMAL HIGH (ref 0.61–1.24)
GFR, Est AFR Am: 45 mL/min — ABNORMAL LOW (ref >60)
GFR, Estimated: 39 mL/min — ABNORMAL LOW (ref >60)
Glucose, Bld: 140 mg/dL — ABNORMAL HIGH (ref 70–99)
Potassium: 4 mmol/L (ref 3.5–5.1)
Sodium: 141 mmol/L (ref 135–145)
Total Bilirubin: 0.5 mg/dL (ref 0.3–1.2)
Total Protein: 7.4 g/dL (ref 6.5–8.1)

## 2020-03-03 NOTE — Telephone Encounter (Signed)
Scheduled per 07/14 los, patient has received updated calender.

## 2020-03-04 ENCOUNTER — Ambulatory Visit
Admission: RE | Admit: 2020-03-04 | Discharge: 2020-03-04 | Disposition: A | Discharge status: Home / Self Care | Payer: Medicare Other | Source: Ambulatory Visit | Attending: Radiation Oncology | Admitting: Radiation Oncology

## 2020-03-04 DIAGNOSIS — C3431 Malignant neoplasm of lower lobe, right bronchus or lung: Secondary | ICD-10-CM | POA: Diagnosis not present

## 2020-03-04 DIAGNOSIS — C7A1 Malignant poorly differentiated neuroendocrine tumors: Secondary | ICD-10-CM | POA: Diagnosis not present

## 2020-03-04 MED FILL — TEMOZOLOMIDE 100 MG CAPS: 100 | 28 days supply | Qty: 20 | Fill #3

## 2020-03-04 MED FILL — ONDANSETRON HCL 8 MG TABLET: 8 | 10 days supply | Qty: 30 | Fill #0

## 2020-03-04 MED FILL — CAPECITABINE 500 MG TABLET: 500 | 14 days supply | Qty: 84 | Fill #0

## 2020-03-05 ENCOUNTER — Ambulatory Visit
Admission: RE | Admit: 2020-03-05 | Discharge: 2020-03-05 | Disposition: A | Discharge status: Home / Self Care | Payer: Medicare Other | Source: Ambulatory Visit | Attending: Radiation Oncology | Admitting: Radiation Oncology

## 2020-03-05 ENCOUNTER — Encounter: Payer: Self-pay | Admitting: Radiation Oncology

## 2020-03-05 DIAGNOSIS — C7A1 Malignant poorly differentiated neuroendocrine tumors: Secondary | ICD-10-CM | POA: Diagnosis not present

## 2020-03-05 DIAGNOSIS — C3431 Malignant neoplasm of lower lobe, right bronchus or lung: Secondary | ICD-10-CM | POA: Diagnosis not present

## 2020-03-08 ENCOUNTER — Ambulatory Visit: Payer: Medicare Other | Admitting: Radiation Oncology

## 2020-03-09 ENCOUNTER — Ambulatory Visit: Payer: Medicare Other | Admitting: Radiation Oncology

## 2020-03-10 ENCOUNTER — Ambulatory Visit: Payer: Medicare Other | Admitting: Radiation Oncology

## 2020-03-11 ENCOUNTER — Ambulatory Visit: Payer: Medicare Other | Admitting: Radiation Oncology

## 2020-03-12 ENCOUNTER — Ambulatory Visit: Payer: Medicare Other | Admitting: Radiation Oncology

## 2020-03-15 ENCOUNTER — Ambulatory Visit: Payer: Medicare Other | Admitting: Radiation Oncology

## 2020-03-22 ENCOUNTER — Encounter: Payer: Self-pay | Admitting: Family Medicine

## 2020-03-22 ENCOUNTER — Other Ambulatory Visit: Payer: Self-pay

## 2020-03-22 ENCOUNTER — Ambulatory Visit (INDEPENDENT_AMBULATORY_CARE_PROVIDER_SITE_OTHER): Payer: Medicare Other | Admitting: Family Medicine

## 2020-03-22 VITALS — BP 138/78 | HR 74 | Temp 99.1°F | Ht 75.0 in | Wt 183.0 lb

## 2020-03-22 DIAGNOSIS — I10 Essential (primary) hypertension: Secondary | ICD-10-CM | POA: Diagnosis not present

## 2020-03-22 DIAGNOSIS — E1121 Type 2 diabetes mellitus with diabetic nephropathy: Secondary | ICD-10-CM | POA: Diagnosis not present

## 2020-03-22 DIAGNOSIS — N1832 Chronic kidney disease, stage 3b: Secondary | ICD-10-CM

## 2020-03-22 DIAGNOSIS — E1159 Type 2 diabetes mellitus with other circulatory complications: Secondary | ICD-10-CM | POA: Diagnosis not present

## 2020-03-22 DIAGNOSIS — E1122 Type 2 diabetes mellitus with diabetic chronic kidney disease: Secondary | ICD-10-CM

## 2020-03-22 DIAGNOSIS — I152 Hypertension secondary to endocrine disorders: Secondary | ICD-10-CM

## 2020-03-22 MED ORDER — GLUCOSE BLOOD VI STRP: ORAL_STRIP | 3 refills | Status: DC

## 2020-03-22 NOTE — Progress Notes (Signed)
Phone [phone] In person visit   Subjective:   Richard Navarro is a 74 y.o. year old very pleasant male patient who presents for/with See problem oriented charting Chief Complaint  Patient presents with  . Diabetes    This visit occurred during the SARS-CoV-2 public health emergency.  Safety protocols were in place, including screening questions prior to the visit, additional usage of staff PPE, and extensive cleaning of exam room while observing appropriate contact time as indicated for disinfecting solutions.   Past Medical History-  Patient Active Problem List   Diagnosis Date Noted  . Anemia 05/07/2018    Priority: High  . Chronic kidney disease (CKD) stage G3b/A1, moderately decreased glomerular filtration rate (GFR) between 30-44 mL/min/1.73 square meter and albuminuria creatinine ratio less than 30 mg/g 03/04/2018    Priority: High  . Neuroendocrine carcinoma ([place]) 08/09/2017    Priority: High  . Primary cancer of right lung ([facility]) 12/22/2016    Priority: High  . Diabetes mellitus with renal manifestation ([facility])     Priority: High  . Myalgia due to statin 03/04/2018    Priority: Medium  . COPD (chronic obstructive pulmonary disease) ([facility]) 12/01/2016    Priority: Medium  . PTSD (post-traumatic stress disorder) 12/07/2015    Priority: Medium  . Erectile dysfunction 04/09/2014    Priority: Medium  . Former smoker 03/26/2014    Priority: Medium  . Hypertension associated with diabetes ([facility])     Priority: Medium  . Gout     Priority: Medium  . Depression     Priority: Medium  . Hyperlipidemia     Priority: Medium  . Thrombocytopenia ([place])     Priority: Low  . Pneumonia 08/09/2017    Priority: Low  . Encounter for antineoplastic chemotherapy 01/10/2017    Priority: Low  . Goals of care, counseling/discussion 01/10/2017    Priority: Low  . Prostate cancer screening 04/17/2014    Priority: Low  . Arthritis 03/26/2014    Priority: Low  . History of  adenomatous polyp of colon 03/26/2014    Priority: Low  . GERD (gastroesophageal reflux disease)     Priority: Low  . 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 ([place]) 02/17/2020  . Sepsis due to pneumonia ([facility]) 01/26/2020  . Recurrent major depressive disorder, in full remission ([facility]) 10/21/2019    Medications- reviewed and updated Current Outpatient Medications  Medication Sig Dispense Refill  . albuterol (VENTOLIN HFA) 108 (90 Base) MCG/ACT inhaler Inhale 2 puffs into the lungs every 4 (four) hours as needed for wheezing or shortness of breath. 3 Inhaler 2  . allopurinol (ZYLOPRIM) 100 MG tablet TAKE 1 TABLET DAILY (Patient taking differently: Take 100 mg by mouth daily. ) 90 tablet 3  . amLODipine (NORVASC) 10 MG tablet Take 1 tablet (10 mg total) by mouth daily. 90 tablet 3  . calcium carbonate (OS-CAL) 1250 (500 Ca) MG chewable tablet Chew 2 tablets by mouth 2 (two) times daily.    . capecitabine (XELODA) 500 MG tablet TAKE 3 TABLETS BY MOUTH TWICE DAILY FOR 14 DAYS EVERY 4 WEEKS. 84 tablet 2  . Cholecalciferol (VITAMIN D3) 25 MCG (1000 UT) CAPS Take 1 capsule by mouth daily.    . Continuous Blood Gluc Sensor (FREESTYLE LIBRE 14 DAY SENSOR) MISC USE TO CHECK BLOOD SUGAR AS DIRECTED AND CHANGE EVERY 14 DAYS 2 each 5  . docusate sodium (COLACE) 100 MG capsule Take 200 mg by mouth 2 (two) times daily as needed for mild  constipation.    . Ferrous Sulfate (IRON) 325 (65 Fe) MG TABS Take 1 tablet by mouth every other day.     Marland Kitchen glipiZIDE (GLUCOTROL) 5 MG tablet Take 1 tablet (5 mg total) by mouth daily before breakfast. 90 tablet 3  . glucose blood test strip Use to test blood sugar twice a day 200 each 3  . guaiFENesin (MUCINEX) 600 MG 12 hr tablet Take 600 mg by mouth 2 (two) times daily.     Marland Kitchen ketotifen (ZADITOR) 0.025 % ophthalmic solution Place 1 drop into both eyes daily as needed (allergies).    . magic mouthwash w/lidocaine SOLN Take 5 mLs by mouth every 6 (six) hours as  needed for mouth pain.    . metoprolol tartrate (LOPRESSOR) 25 MG tablet Take 0.5 tablets (12.5 mg total) by mouth 2 (two) times daily. 90 tablet 3  . ondansetron (ZOFRAN) 8 MG tablet TAKE 1 TABLET BY MOUTH EVERY 8 HOURS AS NEEDED FOR NAUSEA OR VOMITING. 30 tablet 0  . pantoprazole (PROTONIX) 40 MG tablet Take 1 tablet (40 mg total) by mouth daily. 90 tablet 3  . polyethylene glycol (MIRALAX / GLYCOLAX) 17 g packet Take 17 g by mouth daily.    Marland Kitchen SPIRIVA RESPIMAT 2.5 MCG/ACT AERS USE 2 INHALATIONS DAILY (Patient taking differently: Inhale 2 puffs into the lungs daily. ) 12 g 3  . temozolomide (TEMODAR) 100 MG capsule Four capsule p.o. daily from days 10,11,12,13 and 14 every 4 weeks.  May take on an empty stomach or at bedtime to decrease nausea & vomiting. (Patient taking differently: Take 400 mg by mouth See admin instructions. Daily from days 10,11,12,13 and 14 every 4 weeks.  May take on an empty stomach or at bedtime to decrease nausea & vomiting.) 20 capsule 3  . vitamin B-12 (CYANOCOBALAMIN) 1000 MCG tablet Take 1,000 mcg by mouth daily.     . vitamin C (ASCORBIC ACID) 500 MG tablet Take 500 mg by mouth daily.     No current facility-administered medications for this visit.     Objective:  BP 138/78   Pulse 74   Temp 99.1 F (37.3 C) (Temporal)   Ht 6\' 3"  (1.905 m)   Wt 183 lb (83 kg)   SpO2 96%   BMI 22.87 kg/m  Gen: NAD, resting comfortably CV: RRR no murmurs rubs or gallops Lungs: CTAB no crackles, wheeze, rhonchi Abdomen: soft/nontender/nondistended/normal bowel sounds.  Ext: no edema Skin: warm, dry     Assessment and Plan   # Lung cancer- s/p SBRT x 8 treatments- some esphageal issues/swallowing issues afterwards- plan is to monitor to see if improves.   #Chronic kidney disease stage III S: Patient with significant worsening in kidney function dating back to November 2020 when patient was on Afinitor previously- later went back on this but did not worsen as much (did  raise CBGs though and determined not effective so switched). Follows with [facility]- was told yearly visit A/P: GFR stable on recent labs within 3 weeks in the 40s. Continue to avoid nsaids. Monitor.    #Diabetes mellitus S: No Rx as of 06/23/2019 but had to restart glipizide 5 mg intermittently if sugars gets high. Glipizide 2.5mg  only if blood sugar above 160 regularly fasting. No true lows since last visit with this regimen. Freestyle Numbers once got low to 40 but finger prick was 108- he is not sure about accurate.  Variations in sugar levels.   Prior to reduce GFR-patient had been on  metformin 500 mg twice a day and Amaryl 2 mg daily.   Lab Results  Component Value Date   HGBA1C 6.4 (H) 01/26/2020   HGBA1C 9.1 (A) 10/21/2019   HGBA1C 5.4 06/23/2019   A/P: reasonable control it sounds like- thankful no lows- check a1c next visit- honestly want to just keep below 8 or 8.5.   #Hypertension associated with diabetes S: Compliant with amlodipine 10 mg and metoprolol 12.5 mg twice daily  BP Readings from Last 3 Encounters:  03/22/20 138/78  03/03/20 (!) 141/79  02/03/20 130/70   A/P: high normal but controlled- continue current medicines   Recommended follow up: Return in about 2 months (around 05/22/2020) for follow up- or sooner if needed. Future Appointments  Date Time Provider [place]  03/31/2020  9:00 AM [facility] None  03/31/2020  9:45 AM Curt Bears, MD [facility] None  04/14/2020 11:00 AM Marzetta Board, DPM [id number]    Lab/Order associations:   ICD-10-CM   1. Type 2 diabetes mellitus with stage 3b chronic kidney disease, without long-term current use of insulin (HCC)  E11.21    N18.32   2. Chronic kidney disease (CKD) stage G3b/A1, moderately decreased glomerular filtration rate (GFR) between 30-44 mL/min/1.73 square meter and albuminuria creatinine ratio less than 30 mg/g  N18.32   3. Hypertension associated with  diabetes ([place])  E11.59    I10     Meds ordered this encounter  Medications  . glucose blood test strip    Sig: Use to test blood sugar twice a day    Dispense:  200 each    Refill:  3    Freestyle Test Strips E11.9   Return precautions advised.  [name], MD

## 2020-03-22 NOTE — Patient Instructions (Addendum)
Refilled test strips. Technically I have to say to use new strips once expired.   Esophageal issues- hoping improves further out from radiation. Radiation can be tough. Let me know if night temp above 100.5.   Sounds like freestyle not always accurate but im still ok with it as long as helps cut down on pricks and you check prick value if getting low reading.   Recommended follow up: Return in about 2 months (around 05/22/2020) for follow up- or sooner if needed.

## 2020-03-24 ENCOUNTER — Other Ambulatory Visit: Payer: Self-pay | Admitting: Internal Medicine

## 2020-03-31 ENCOUNTER — Inpatient Hospital Stay: Payer: Medicare Other | Attending: Internal Medicine

## 2020-03-31 ENCOUNTER — Inpatient Hospital Stay (HOSPITAL_BASED_OUTPATIENT_CLINIC_OR_DEPARTMENT_OTHER): Payer: Medicare Other | Admitting: Internal Medicine

## 2020-03-31 ENCOUNTER — Encounter: Payer: Self-pay | Admitting: Internal Medicine

## 2020-03-31 ENCOUNTER — Other Ambulatory Visit: Payer: Self-pay

## 2020-03-31 VITALS — BP 140/71 | HR 80 | Temp 98.1°F | Resp 20 | Ht 75.0 in | Wt 182.8 lb

## 2020-03-31 DIAGNOSIS — C7A090 Malignant carcinoid tumor of the bronchus and lung: Secondary | ICD-10-CM | POA: Diagnosis not present

## 2020-03-31 DIAGNOSIS — C7B02 Secondary carcinoid tumors of liver: Secondary | ICD-10-CM | POA: Diagnosis not present

## 2020-03-31 DIAGNOSIS — C3431 Malignant neoplasm of lower lobe, right bronchus or lung: Secondary | ICD-10-CM | POA: Diagnosis not present

## 2020-03-31 DIAGNOSIS — K219 Gastro-esophageal reflux disease without esophagitis: Secondary | ICD-10-CM | POA: Diagnosis not present

## 2020-03-31 DIAGNOSIS — Z7984 Long term (current) use of oral hypoglycemic drugs: Secondary | ICD-10-CM | POA: Diagnosis not present

## 2020-03-31 DIAGNOSIS — Z5111 Encounter for antineoplastic chemotherapy: Secondary | ICD-10-CM

## 2020-03-31 DIAGNOSIS — I10 Essential (primary) hypertension: Secondary | ICD-10-CM | POA: Insufficient documentation

## 2020-03-31 DIAGNOSIS — Z79899 Other long term (current) drug therapy: Secondary | ICD-10-CM | POA: Diagnosis not present

## 2020-03-31 DIAGNOSIS — C3491 Malignant neoplasm of unspecified part of right bronchus or lung: Secondary | ICD-10-CM | POA: Diagnosis not present

## 2020-03-31 DIAGNOSIS — J439 Emphysema, unspecified: Secondary | ICD-10-CM | POA: Insufficient documentation

## 2020-03-31 DIAGNOSIS — E785 Hyperlipidemia, unspecified: Secondary | ICD-10-CM | POA: Diagnosis not present

## 2020-03-31 DIAGNOSIS — E119 Type 2 diabetes mellitus without complications: Secondary | ICD-10-CM | POA: Diagnosis not present

## 2020-03-31 DIAGNOSIS — N1832 Chronic kidney disease, stage 3b: Secondary | ICD-10-CM | POA: Diagnosis not present

## 2020-03-31 DIAGNOSIS — C3490 Malignant neoplasm of unspecified part of unspecified bronchus or lung: Secondary | ICD-10-CM

## 2020-03-31 DIAGNOSIS — N189 Chronic kidney disease, unspecified: Secondary | ICD-10-CM | POA: Insufficient documentation

## 2020-03-31 LAB — CBC WITH DIFFERENTIAL (CANCER CENTER ONLY)
Abs Immature Granulocytes: 0.01 10*3/uL (ref 0.00–0.07)
Basophils Absolute: 0 10*3/uL (ref 0.0–0.1)
Basophils Relative: 0 %
Eosinophils Absolute: 0 10*3/uL (ref 0.0–0.5)
Eosinophils Relative: 1 %
HCT: 33.9 % — ABNORMAL LOW (ref 39.0–52.0)
Hemoglobin: 11.7 g/dL — ABNORMAL LOW (ref 13.0–17.0)
Immature Granulocytes: 0 %
Lymphocytes Relative: 23 %
Lymphs Abs: 0.7 10*3/uL (ref 0.7–4.0)
MCH: 32.9 pg (ref 26.0–34.0)
MCHC: 34.5 g/dL (ref 30.0–36.0)
MCV: 95.2 fL (ref 80.0–100.0)
Monocytes Absolute: 0.4 10*3/uL (ref 0.1–1.0)
Monocytes Relative: 13 %
Neutro Abs: 2 10*3/uL (ref 1.7–7.7)
Neutrophils Relative %: 63 %
Platelet Count: 144 10*3/uL — ABNORMAL LOW (ref 150–400)
RBC: 3.56 MIL/uL — ABNORMAL LOW (ref 4.22–5.81)
RDW: 15.7 % — ABNORMAL HIGH (ref 11.5–15.5)
WBC Count: 3.1 10*3/uL — ABNORMAL LOW (ref 4.0–10.5)
nRBC: 0 % (ref 0.0–0.2)

## 2020-03-31 LAB — CMP (CANCER CENTER ONLY)
ALT: 12 U/L (ref 0–44)
AST: 14 U/L — ABNORMAL LOW (ref 15–41)
Albumin: 3.7 g/dL (ref 3.5–5.0)
Alkaline Phosphatase: 59 U/L (ref 38–126)
Anion gap: 9 (ref 5–15)
BUN: 22 mg/dL (ref 8–23)
CO2: 25 mmol/L (ref 22–32)
Calcium: 9.6 mg/dL (ref 8.9–10.3)
Chloride: 106 mmol/L (ref 98–111)
Creatinine: 1.79 mg/dL — ABNORMAL HIGH (ref 0.61–1.24)
GFR, Est AFR Am: 42 mL/min — ABNORMAL LOW (ref >60)
GFR, Estimated: 36 mL/min — ABNORMAL LOW (ref >60)
Glucose, Bld: 120 mg/dL — ABNORMAL HIGH (ref 70–99)
Potassium: 3.5 mmol/L (ref 3.5–5.1)
Sodium: 140 mmol/L (ref 135–145)
Total Bilirubin: 0.3 mg/dL (ref 0.3–1.2)
Total Protein: 7.5 g/dL (ref 6.5–8.1)

## 2020-03-31 MED FILL — CAPECITABINE 500 MG TABLET: 500 | 14 days supply | Qty: 84 | Fill #1

## 2020-03-31 MED FILL — TEMOZOLOMIDE 100 MG CAPS: 100 | 28 days supply | Qty: 20 | Fill #0
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
## 2020-04-02 ENCOUNTER — Telehealth: Payer: Self-pay | Admitting: Internal Medicine

## 2020-04-02 NOTE — Telephone Encounter (Signed)
Scheduled per los. Called and spoke with [name]. Confirmed appt

## 2020-04-05 NOTE — Progress Notes (Signed)
°  Radiation Oncology         [phone] ________________________________  Name: Richard Navarro MRN: [id number]  Date: 03/05/2020  DOB: 1946-05-11  End of Treatment Note  Diagnosis:    Stage IV low-grade neuroendocrine carcinoma  Indication for treatment::  curative       Radiation treatment dates:   02/25/20 - 03/05/20  Site/dose:   The patient was treated to the right lung with a course of stereotactic body radiation treatment-like ttreatment.  The patient received 40 Gray in 8 fractions using a IMRT technique.  Narrative: The patient tolerated radiation treatment relatively well.   No unexpected difficulties.  The patient's breathing did not significantly change during the course of the treatment.  Plan: The patient has completed radiation treatment. The patient will return to radiation oncology clinic for routine followup in one month. I advised the patient to call or return sooner if they have any questions or concerns related to their recovery or treatment. ________________________________  Jodelle Gross, M.D., Ph.D.

## 2020-04-14 ENCOUNTER — Other Ambulatory Visit: Payer: Self-pay

## 2020-04-14 ENCOUNTER — Ambulatory Visit (INDEPENDENT_AMBULATORY_CARE_PROVIDER_SITE_OTHER): Payer: Medicare Other | Admitting: Podiatry

## 2020-04-14 DIAGNOSIS — E1151 Type 2 diabetes mellitus with diabetic peripheral angiopathy without gangrene: Secondary | ICD-10-CM | POA: Diagnosis not present

## 2020-04-14 DIAGNOSIS — M79675 Pain in left toe(s): Secondary | ICD-10-CM

## 2020-04-14 DIAGNOSIS — M79674 Pain in right toe(s): Secondary | ICD-10-CM | POA: Diagnosis not present

## 2020-04-14 DIAGNOSIS — B351 Tinea unguium: Secondary | ICD-10-CM

## 2020-04-17 ENCOUNTER — Ambulatory Visit: Payer: Medicare Other | Attending: Internal Medicine

## 2020-04-17 DIAGNOSIS — Z23 Encounter for immunization: Secondary | ICD-10-CM

## 2020-04-17 NOTE — Progress Notes (Signed)
   Covid-19 Vaccination Clinic  Name:  Richard Navarro    MRN: [id number] DOB: 1945/08/24  04/17/2020  Richard Navarro was observed post Covid-19 immunization for 15 minutes without incident. He was provided with Vaccine Information Sheet and instruction to access the V-Safe system.   Richard Navarro was instructed to call 911 with any severe reactions post vaccine: Marland Kitchen Difficulty breathing  . Swelling of face and throat  . A fast heartbeat  . A bad rash all over body  . Dizziness and weakness   Immunizations Administered    Name Date Dose VIS Date Route   Pfizer COVID-19 Vaccine 04/17/2020 10:53 AM 0.3 mL 10/15/2018 Intramuscular   Manufacturer: [place]   Lot: [id number]   [place]: [id number]

## 2020-04-18 ENCOUNTER — Encounter: Payer: Self-pay | Admitting: Podiatry
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
## 2020-04-23 ENCOUNTER — Other Ambulatory Visit: Payer: Self-pay | Admitting: Internal Medicine

## 2020-04-23 DIAGNOSIS — R112 Nausea with vomiting, unspecified: Secondary | ICD-10-CM

## 2020-04-23 MED FILL — ONDANSETRON HCL 8 MG TABLET: 8 | 10 days supply | Qty: 30 | Fill #0

## 2020-04-28 MED FILL — CAPECITABINE 500 MG TABLET: 500 | 14 days supply | Qty: 84 | Fill #2

## 2020-04-28 MED FILL — TEMOZOLOMIDE 100 MG CAPS: 100 | 28 days supply | Qty: 20 | Fill #1

## 2020-04-30 ENCOUNTER — Ambulatory Visit (HOSPITAL_COMMUNITY)
Admission: RE | Admit: 2020-04-30 | Discharge: 2020-04-30 | Disposition: A | Discharge status: Home / Self Care | Payer: Medicare Other | Source: Ambulatory Visit | Attending: Internal Medicine | Admitting: Internal Medicine

## 2020-04-30 ENCOUNTER — Inpatient Hospital Stay: Payer: Medicare Other | Attending: Internal Medicine

## 2020-04-30 ENCOUNTER — Other Ambulatory Visit: Payer: Self-pay

## 2020-04-30 DIAGNOSIS — K449 Diaphragmatic hernia without obstruction or gangrene: Secondary | ICD-10-CM | POA: Diagnosis not present

## 2020-04-30 DIAGNOSIS — N189 Chronic kidney disease, unspecified: Secondary | ICD-10-CM | POA: Diagnosis not present

## 2020-04-30 DIAGNOSIS — C3490 Malignant neoplasm of unspecified part of unspecified bronchus or lung: Secondary | ICD-10-CM | POA: Insufficient documentation

## 2020-04-30 DIAGNOSIS — E119 Type 2 diabetes mellitus without complications: Secondary | ICD-10-CM | POA: Insufficient documentation

## 2020-04-30 DIAGNOSIS — K219 Gastro-esophageal reflux disease without esophagitis: Secondary | ICD-10-CM | POA: Diagnosis not present

## 2020-04-30 DIAGNOSIS — C7B02 Secondary carcinoid tumors of liver: Secondary | ICD-10-CM | POA: Diagnosis not present

## 2020-04-30 DIAGNOSIS — N281 Cyst of kidney, acquired: Secondary | ICD-10-CM | POA: Diagnosis not present

## 2020-04-30 DIAGNOSIS — Z7984 Long term (current) use of oral hypoglycemic drugs: Secondary | ICD-10-CM | POA: Diagnosis not present

## 2020-04-30 DIAGNOSIS — I700 Atherosclerosis of aorta: Secondary | ICD-10-CM | POA: Diagnosis not present

## 2020-04-30 DIAGNOSIS — C7A090 Malignant carcinoid tumor of the bronchus and lung: Secondary | ICD-10-CM | POA: Diagnosis not present

## 2020-04-30 DIAGNOSIS — I10 Essential (primary) hypertension: Secondary | ICD-10-CM | POA: Diagnosis not present

## 2020-04-30 DIAGNOSIS — E785 Hyperlipidemia, unspecified: Secondary | ICD-10-CM | POA: Insufficient documentation

## 2020-04-30 DIAGNOSIS — Z79899 Other long term (current) drug therapy: Secondary | ICD-10-CM | POA: Insufficient documentation

## 2020-04-30 DIAGNOSIS — K7689 Other specified diseases of liver: Secondary | ICD-10-CM | POA: Diagnosis not present

## 2020-04-30 DIAGNOSIS — J90 Pleural effusion, not elsewhere classified: Secondary | ICD-10-CM | POA: Diagnosis not present

## 2020-04-30 DIAGNOSIS — J439 Emphysema, unspecified: Secondary | ICD-10-CM | POA: Insufficient documentation

## 2020-04-30 DIAGNOSIS — C7A1 Malignant poorly differentiated neuroendocrine tumors: Secondary | ICD-10-CM | POA: Diagnosis not present

## 2020-04-30 LAB — CBC WITH DIFFERENTIAL (CANCER CENTER ONLY)
Abs Immature Granulocytes: 0.03 10*3/uL (ref 0.00–0.07)
Basophils Absolute: 0 10*3/uL (ref 0.0–0.1)
Basophils Relative: 0 %
Eosinophils Absolute: 0.1 10*3/uL (ref 0.0–0.5)
Eosinophils Relative: 1 %
HCT: 34.6 % — ABNORMAL LOW (ref 39.0–52.0)
Hemoglobin: 11.9 g/dL — ABNORMAL LOW (ref 13.0–17.0)
Immature Granulocytes: 0 %
Lymphocytes Relative: 9 %
Lymphs Abs: 0.6 10*3/uL — ABNORMAL LOW (ref 0.7–4.0)
MCH: 32.3 pg (ref 26.0–34.0)
MCHC: 34.4 g/dL (ref 30.0–36.0)
MCV: 94 fL (ref 80.0–100.0)
Monocytes Absolute: 0.7 10*3/uL (ref 0.1–1.0)
Monocytes Relative: 10 %
Neutro Abs: 6 10*3/uL (ref 1.7–7.7)
Neutrophils Relative %: 80 %
Platelet Count: 204 10*3/uL (ref 150–400)
RBC: 3.68 MIL/uL — ABNORMAL LOW (ref 4.22–5.81)
RDW: 14.8 % (ref 11.5–15.5)
WBC Count: 7.5 10*3/uL (ref 4.0–10.5)
nRBC: 0 % (ref 0.0–0.2)

## 2020-04-30 LAB — CMP (CANCER CENTER ONLY)
ALT: 17 U/L (ref 0–44)
AST: 17 U/L (ref 15–41)
Albumin: 3.8 g/dL (ref 3.5–5.0)
Alkaline Phosphatase: 63 U/L (ref 38–126)
Anion gap: 11 (ref 5–15)
BUN: 21 mg/dL (ref 8–23)
CO2: 24 mmol/L (ref 22–32)
Calcium: 9.7 mg/dL (ref 8.9–10.3)
Chloride: 106 mmol/L (ref 98–111)
Creatinine: 1.74 mg/dL — ABNORMAL HIGH (ref 0.61–1.24)
GFR, Est AFR Am: 43 mL/min — ABNORMAL LOW (ref >60)
GFR, Estimated: 37 mL/min — ABNORMAL LOW (ref >60)
Glucose, Bld: 147 mg/dL — ABNORMAL HIGH (ref 70–99)
Potassium: 4.1 mmol/L (ref 3.5–5.1)
Sodium: 141 mmol/L (ref 135–145)
Total Bilirubin: 0.6 mg/dL (ref 0.3–1.2)
Total Protein: 8.4 g/dL — ABNORMAL HIGH (ref 6.5–8.1)

## 2020-05-03 ENCOUNTER — Encounter: Payer: Self-pay | Admitting: Internal Medicine

## 2020-05-03 ENCOUNTER — Other Ambulatory Visit: Payer: Self-pay

## 2020-05-03 ENCOUNTER — Inpatient Hospital Stay (HOSPITAL_BASED_OUTPATIENT_CLINIC_OR_DEPARTMENT_OTHER): Payer: Medicare Other | Admitting: Internal Medicine

## 2020-05-03 ENCOUNTER — Telehealth: Payer: Self-pay | Admitting: Internal Medicine

## 2020-05-03 VITALS — BP 152/76 | HR 76 | Temp 97.8°F | Resp 18 | Ht 75.0 in | Wt 177.4 lb

## 2020-05-03 DIAGNOSIS — I10 Essential (primary) hypertension: Secondary | ICD-10-CM | POA: Diagnosis not present

## 2020-05-03 DIAGNOSIS — E119 Type 2 diabetes mellitus without complications: Secondary | ICD-10-CM | POA: Diagnosis not present

## 2020-05-03 DIAGNOSIS — E1159 Type 2 diabetes mellitus with other circulatory complications: Secondary | ICD-10-CM

## 2020-05-03 DIAGNOSIS — C3491 Malignant neoplasm of unspecified part of right bronchus or lung: Secondary | ICD-10-CM | POA: Diagnosis not present

## 2020-05-03 DIAGNOSIS — I152 Hypertension secondary to endocrine disorders: Secondary | ICD-10-CM

## 2020-05-03 DIAGNOSIS — Z5111 Encounter for antineoplastic chemotherapy: Secondary | ICD-10-CM | POA: Diagnosis not present

## 2020-05-03 DIAGNOSIS — C7A090 Malignant carcinoid tumor of the bronchus and lung: Secondary | ICD-10-CM | POA: Diagnosis not present

## 2020-05-03 DIAGNOSIS — C3431 Malignant neoplasm of lower lobe, right bronchus or lung: Secondary | ICD-10-CM | POA: Diagnosis not present

## 2020-05-03 DIAGNOSIS — E785 Hyperlipidemia, unspecified: Secondary | ICD-10-CM | POA: Diagnosis not present

## 2020-05-03 DIAGNOSIS — C7B02 Secondary carcinoid tumors of liver: Secondary | ICD-10-CM | POA: Diagnosis not present

## 2020-05-03 DIAGNOSIS — J439 Emphysema, unspecified: Secondary | ICD-10-CM | POA: Diagnosis not present
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
## 2020-05-03 NOTE — Telephone Encounter (Signed)
Scheduled appointment per 9/13 los. Patient is aware of appointment. Patient declined calendar print out.

## 2020-05-18 ENCOUNTER — Telehealth: Payer: Self-pay | Admitting: Internal Medicine

## 2020-05-18 NOTE — Telephone Encounter (Signed)
R/s 10/12 appt - per provider PAL. Called and spoke with patient. Confirmed appt

## 2020-05-19 ENCOUNTER — Telehealth: Payer: Self-pay

## 2020-05-19 ENCOUNTER — Other Ambulatory Visit: Payer: Self-pay | Admitting: Internal Medicine

## 2020-05-19 DIAGNOSIS — C7A8 Other malignant neuroendocrine tumors: Secondary | ICD-10-CM

## 2020-05-19 NOTE — Telephone Encounter (Signed)
Nutrition Assessment:  Patient identified on Malnutrition Screening report for weight loss and poor appetite  74 year old male with neuroendocrine tumor of lung and liver.  Past medical history of CKD, DM, GERD, HLD, HTN, PTSD, gout. Patient currently on xeloda and temodar.  Completed radiation 7/7-7/16.    Spoke with patient and [name] (patient contact) over the phone.  Appetite decreased during and following radiation treatment.  Reports uneasy, rumbling in stomach. Denies nausea.  Reports constipation typically prior to radiation now more frequent bowel movements but not diarrhea.  Also reports trouble swallowing following radiation but this has gotten better.  Usually eats 2 boiled eggs, fruit, english muffin around 10-10:30am. Snacks during the day on unsalted nuts, lance crackers.  Dinner is usually around 7-7:30pm.  Last night was beef stew with vegetables.  Drinks glucerna at times.      Medications: reviewed  Labs: reviewed  Anthropometrics:   Height: 73 inches Weight: 177 lb 05/03/20 UBW: 182 lb 03/31/20 BMI: 22 3% weight loss in the last month and half, concerning  NUTRITION DIAGNOSIS: Inadequate oral intake related to cancer related treatment side effects as evidenced by 3% weight loss and decreased appetite   INTERVENTION:  Encouraged patient to discuss symptoms with MD Discussed foods to eat to cause less GI issues.   Encouraged good sources of protein Encouraged oral nutrition supplement at least daily especially with weight loss Discussed chopping foods and adding liquid/moisture to foods for ease of swallowing. Contact information provided    MONITORING, EVALUATION, GOAL: weight loss, intake   NEXT VISIT: November 3rd (Wednesday)   Devonda Pequignot B. Zenia Resides, [place], [place] Registered Dietitian [phone] (mobile)

## 2020-05-21 ENCOUNTER — Telehealth: Payer: Self-pay | Admitting: Family Medicine

## 2020-05-21 NOTE — Telephone Encounter (Signed)
Left message for patient to call back and schedule Medicare Annual Wellness Visit (AWV) either virtually/audio only OR in office. Whatever the patients preference is.  Last AWV 04/08/19; please schedule at anytime with [facility]-Nurse Health Advisor at [facility].  This should be a 45 minute visit.

## 2020-05-24 ENCOUNTER — Other Ambulatory Visit: Payer: Self-pay | Admitting: Internal Medicine

## 2020-05-24 DIAGNOSIS — C7A8 Other malignant neuroendocrine tumors: Secondary | ICD-10-CM

## 2020-05-26 MED FILL — CAPECITABINE 500 MG TABLET: 500 | 14 days supply | Qty: 84 | Fill #0

## 2020-05-26 MED FILL — TEMOZOLOMIDE 100 MG CAPS: 100 | 28 days supply | Qty: 20 | Fill #2

## 2020-06-01 ENCOUNTER — Ambulatory Visit: Payer: TRICARE For Life (TFL) | Admitting: Internal Medicine

## 2020-06-01 ENCOUNTER — Other Ambulatory Visit: Payer: TRICARE For Life (TFL)
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
## 2020-06-07 ENCOUNTER — Inpatient Hospital Stay (HOSPITAL_BASED_OUTPATIENT_CLINIC_OR_DEPARTMENT_OTHER): Payer: Medicare Other | Admitting: Physician Assistant

## 2020-06-07 ENCOUNTER — Other Ambulatory Visit: Payer: Self-pay | Admitting: Physician Assistant

## 2020-06-07 ENCOUNTER — Other Ambulatory Visit: Payer: Self-pay

## 2020-06-07 ENCOUNTER — Inpatient Hospital Stay: Payer: Medicare Other | Attending: Internal Medicine

## 2020-06-07 VITALS — BP 149/75 | HR 72 | Temp 97.6°F | Resp 18 | Ht 75.0 in | Wt 180.3 lb

## 2020-06-07 DIAGNOSIS — N189 Chronic kidney disease, unspecified: Secondary | ICD-10-CM | POA: Insufficient documentation

## 2020-06-07 DIAGNOSIS — K5900 Constipation, unspecified: Secondary | ICD-10-CM | POA: Diagnosis not present

## 2020-06-07 DIAGNOSIS — C7A8 Other malignant neuroendocrine tumors: Secondary | ICD-10-CM | POA: Diagnosis not present

## 2020-06-07 DIAGNOSIS — C7B02 Secondary carcinoid tumors of liver: Secondary | ICD-10-CM | POA: Insufficient documentation

## 2020-06-07 DIAGNOSIS — C7A00 Malignant carcinoid tumor of unspecified site: Secondary | ICD-10-CM | POA: Diagnosis not present

## 2020-06-07 DIAGNOSIS — K219 Gastro-esophageal reflux disease without esophagitis: Secondary | ICD-10-CM | POA: Insufficient documentation

## 2020-06-07 DIAGNOSIS — I10 Essential (primary) hypertension: Secondary | ICD-10-CM | POA: Insufficient documentation

## 2020-06-07 DIAGNOSIS — E119 Type 2 diabetes mellitus without complications: Secondary | ICD-10-CM | POA: Insufficient documentation

## 2020-06-07 DIAGNOSIS — C7A090 Malignant carcinoid tumor of the bronchus and lung: Secondary | ICD-10-CM | POA: Diagnosis present

## 2020-06-07 DIAGNOSIS — Z7984 Long term (current) use of oral hypoglycemic drugs: Secondary | ICD-10-CM | POA: Diagnosis not present

## 2020-06-07 DIAGNOSIS — R1310 Dysphagia, unspecified: Secondary | ICD-10-CM | POA: Diagnosis not present

## 2020-06-07 DIAGNOSIS — Z79899 Other long term (current) drug therapy: Secondary | ICD-10-CM | POA: Insufficient documentation

## 2020-06-07 DIAGNOSIS — C3431 Malignant neoplasm of lower lobe, right bronchus or lung: Secondary | ICD-10-CM

## 2020-06-07 LAB — CBC WITH DIFFERENTIAL (CANCER CENTER ONLY)
Abs Immature Granulocytes: 0.02 10*3/uL (ref 0.00–0.07)
Basophils Absolute: 0 10*3/uL (ref 0.0–0.1)
Basophils Relative: 1 %
Eosinophils Absolute: 0 10*3/uL (ref 0.0–0.5)
Eosinophils Relative: 1 %
HCT: 32.1 % — ABNORMAL LOW (ref 39.0–52.0)
Hemoglobin: 11 g/dL — ABNORMAL LOW (ref 13.0–17.0)
Immature Granulocytes: 0 %
Lymphocytes Relative: 18 %
Lymphs Abs: 1 10*3/uL (ref 0.7–4.0)
MCH: 32.8 pg (ref 26.0–34.0)
MCHC: 34.3 g/dL (ref 30.0–36.0)
MCV: 95.8 fL (ref 80.0–100.0)
Monocytes Absolute: 0.5 10*3/uL (ref 0.1–1.0)
Monocytes Relative: 10 %
Neutro Abs: 3.8 10*3/uL (ref 1.7–7.7)
Neutrophils Relative %: 70 %
Platelet Count: 139 10*3/uL — ABNORMAL LOW (ref 150–400)
RBC: 3.35 MIL/uL — ABNORMAL LOW (ref 4.22–5.81)
RDW: 15.2 % (ref 11.5–15.5)
WBC Count: 5.3 10*3/uL (ref 4.0–10.5)
nRBC: 0 % (ref 0.0–0.2)

## 2020-06-07 LAB — CMP (CANCER CENTER ONLY)
ALT: 10 U/L (ref 0–44)
AST: 15 U/L (ref 15–41)
Albumin: 3.7 g/dL (ref 3.5–5.0)
Alkaline Phosphatase: 71 U/L (ref 38–126)
Anion gap: 5 (ref 5–15)
BUN: 26 mg/dL — ABNORMAL HIGH (ref 8–23)
CO2: 29 mmol/L (ref 22–32)
Calcium: 9.8 mg/dL (ref 8.9–10.3)
Chloride: 103 mmol/L (ref 98–111)
Creatinine: 1.78 mg/dL — ABNORMAL HIGH (ref 0.61–1.24)
GFR, Estimated: 36 mL/min — ABNORMAL LOW (ref >60)
Glucose, Bld: 99 mg/dL (ref 70–99)
Potassium: 4.2 mmol/L (ref 3.5–5.1)
Sodium: 137 mmol/L (ref 135–145)
Total Bilirubin: 0.5 mg/dL (ref 0.3–1.2)
Total Protein: 7.8 g/dL (ref 6.5–8.1)

## 2020-06-07 MED ORDER — SUCRALFATE 1 G PO TABS: 1.0000 g | ORAL_TABLET | Freq: Three times a day (TID) | ORAL | 0 refills | Status: DC

## 2020-06-07 MED FILL — SUCRALFATE 1 GM TAB: 1 | 8 days supply | Qty: 30 | Fill #0

## 2020-06-14 ENCOUNTER — Ambulatory Visit (INDEPENDENT_AMBULATORY_CARE_PROVIDER_SITE_OTHER): Payer: Medicare Other | Admitting: Family Medicine

## 2020-06-14 ENCOUNTER — Encounter: Payer: Self-pay | Admitting: Family Medicine

## 2020-06-14 ENCOUNTER — Other Ambulatory Visit: Payer: Self-pay

## 2020-06-14 VITALS — BP 108/60 | HR 66 | Temp 98.8°F | Ht 75.0 in | Wt 179.6 lb

## 2020-06-14 DIAGNOSIS — E1122 Type 2 diabetes mellitus with diabetic chronic kidney disease: Secondary | ICD-10-CM | POA: Diagnosis not present

## 2020-06-14 DIAGNOSIS — I152 Hypertension secondary to endocrine disorders: Secondary | ICD-10-CM | POA: Diagnosis not present

## 2020-06-14 DIAGNOSIS — E785 Hyperlipidemia, unspecified: Secondary | ICD-10-CM

## 2020-06-14 DIAGNOSIS — E1159 Type 2 diabetes mellitus with other circulatory complications: Secondary | ICD-10-CM | POA: Diagnosis not present

## 2020-06-14 DIAGNOSIS — N1832 Chronic kidney disease, stage 3b: Secondary | ICD-10-CM | POA: Diagnosis not present

## 2020-06-14 DIAGNOSIS — Z23 Encounter for immunization: Secondary | ICD-10-CM | POA: Diagnosis not present

## 2020-06-14 LAB — POCT GLYCOSYLATED HEMOGLOBIN (HGB A1C): Hemoglobin A1C: 6 % — AB (ref 4.0–5.6)

## 2020-06-14 NOTE — Patient Instructions (Addendum)
High dose flu shot today  POC a1c today if below 7 you are free to go and continue current meds

## 2020-06-14 NOTE — Progress Notes (Signed)
Phone [phone] In person visit   Subjective:   Richard Navarro is a 74 y.o. year old very pleasant male patient who presents for/with See problem oriented charting Chief Complaint  Patient presents with  . Constipation    OTC medication with no relief   This visit occurred during the SARS-CoV-2 public health emergency.  Safety protocols were in place, including screening questions prior to the visit, additional usage of staff PPE, and extensive cleaning of exam room while observing appropriate contact time as indicated for disinfecting solutions.   Past Medical History-  Patient Active Problem List   Diagnosis Date Noted  . Anemia 05/07/2018    Priority: High  . Chronic kidney disease (CKD) stage G3b/A1, moderately decreased glomerular filtration rate (GFR) between 30-44 mL/min/1.73 square meter and albuminuria creatinine ratio less than 30 mg/g (HCC) 03/04/2018    Priority: High  . Neuroendocrine carcinoma ([facility]) 08/09/2017    Priority: High  . Primary cancer of right lung ([facility]) 12/22/2016    Priority: High  . Diabetes mellitus with renal manifestation ([facility])     Priority: High  . Myalgia due to statin 03/04/2018    Priority: Medium  . COPD (chronic obstructive pulmonary disease) ([facility]) 12/01/2016    Priority: Medium  . PTSD (post-traumatic stress disorder) 12/07/2015    Priority: Medium  . Erectile dysfunction 04/09/2014    Priority: Medium  . Former smoker 03/26/2014    Priority: Medium  . Hypertension associated with diabetes ([facility])     Priority: Medium  . Gout     Priority: Medium  . Depression     Priority: Medium  . Hyperlipidemia     Priority: Medium  . Thrombocytopenia ([facility])     Priority: Low  . Pneumonia 08/09/2017    Priority: Low  . Encounter for antineoplastic chemotherapy 01/10/2017    Priority: Low  . Goals of care, counseling/discussion 01/10/2017    Priority: Low  . Prostate cancer screening 04/17/2014    Priority: Low  . Arthritis 03/26/2014     Priority: Low  . History of adenomatous polyp of colon 03/26/2014    Priority: Low  . GERD (gastroesophageal reflux disease)     Priority: Low  . 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 Sepsis due to pneumonia ([facility]) 01/26/2020  . Recurrent major depressive disorder, in full remission ([facility]) 10/21/2019    Medications- reviewed and updated Current Outpatient Medications  Medication Sig Dispense Refill  . albuterol (VENTOLIN HFA) 108 (90 Base) MCG/ACT inhaler Inhale 2 puffs into the lungs every 4 (four) hours as needed for wheezing or shortness of breath. 3 Inhaler 2  . allopurinol (ZYLOPRIM) 100 MG tablet TAKE 1 TABLET DAILY (Patient taking differently: Take 100 mg by mouth daily. ) 90 tablet 3  . amLODipine (NORVASC) 10 MG tablet Take 1 tablet (10 mg total) by mouth daily. 90 tablet 3  . calcium carbonate (OS-CAL) 1250 (500 Ca) MG chewable tablet Chew 2 tablets by mouth 2 (two) times daily.    . capecitabine (XELODA) 500 MG tablet TAKE 3 TABLETS BY MOUTH TWICE DAILY FOR 14 DAYS EVERY 4 WEEKS. 84 tablet 2  . Cholecalciferol (VITAMIN D3) 25 MCG (1000 UT) CAPS Take 1 capsule by mouth daily.    . Continuous Blood Gluc Sensor (FREESTYLE LIBRE 14 DAY SENSOR) MISC USE TO CHECK BLOOD SUGAR AS DIRECTED AND CHANGE EVERY 14 DAYS 2 each 5  . docusate sodium (COLACE) 100 MG capsule Take 200 mg by mouth  2 (two) times daily as needed for mild constipation.    . Ferrous Sulfate (IRON) 325 (65 Fe) MG TABS Take 1 tablet by mouth every other day.     Marland Kitchen glipiZIDE (GLUCOTROL) 5 MG tablet Take 1 tablet (5 mg total) by mouth daily before breakfast. 90 tablet 3  . glucose blood test strip Use to test blood sugar twice a day 200 each 3  . guaiFENesin (MUCINEX) 600 MG 12 hr tablet Take 600 mg by mouth 2 (two) times daily.     Marland Kitchen ketotifen (ZADITOR) 0.025 % ophthalmic solution Place 1 drop into both eyes daily as needed (allergies).    . magic mouthwash w/lidocaine SOLN Take 5 mLs by  mouth every 6 (six) hours as needed for mouth pain.    . metoprolol tartrate (LOPRESSOR) 25 MG tablet Take 0.5 tablets (12.5 mg total) by mouth 2 (two) times daily. 90 tablet 3  . ondansetron (ZOFRAN) 8 MG tablet TAKE 1 TABLET BY MOUTH EVERY 8 HOURS AS NEEDED FOR NAUSEA AND/OR VOMITING 30 tablet 0  . pantoprazole (PROTONIX) 40 MG tablet Take 1 tablet (40 mg total) by mouth daily. 90 tablet 3  . polyethylene glycol (MIRALAX / GLYCOLAX) 17 g packet Take 17 g by mouth daily.    Marland Kitchen SPIRIVA RESPIMAT 2.5 MCG/ACT AERS USE 2 INHALATIONS DAILY (Patient taking differently: Inhale 2 puffs into the lungs daily. ) 12 g 3  . sucralfate (CARAFATE) 1 g tablet Take 1 tablet (1 g total) by mouth 4 (four) times daily -  with meals and at bedtime. 30 tablet 0  . temozolomide (TEMODAR) 100 MG capsule TAKE FOUR CAPSULES BY MOUTH DAILY FROM DAYS 35,57,32,20 AND 14 EVERY 4 WEEKS. MAY TAKE ON AN EMPTY STOMACH OR AT BEDTIME TO DECREASE NAUSEA & VOMITING 20 capsule 3  . vitamin B-12 (CYANOCOBALAMIN) 1000 MCG tablet Take 1,000 mcg by mouth daily.     . vitamin C (ASCORBIC ACID) 500 MG tablet Take 500 mg by mouth daily.     No current facility-administered medications for this visit.     Objective:  BP 108/60   Pulse 66   Temp 98.8 F (37.1 C) (Temporal)   Ht 6\' 3"  (1.905 m)   Wt 179 lb 9.6 oz (81.5 kg)   SpO2 96%   BMI 22.45 kg/m  Gen: NAD, resting comfortably CV: RRR no murmurs rubs or gallops Lungs: CTAB no crackles, wheeze, rhonchi  Ext: no edema Skin: warm, dry    Assessment and Plan   # Lung cancer- just had follow up 05/03/20 with oncology- plan is to continue xelodo and temodar.   #Chronic kidney disease stage III S: Patient with significant worsening in kidney function dating back to November 2020 when patient was on Afinitor previously- later went back on this but did not worsen as much (did raise CBGs though and determined not effective so switched). Follows with [facility] A/P: GFR stable on  last check around 1.7-1.8 creatinine.  Continue to avoid nsaids  #Diabetes mellitus S: No Rx as of 06/23/2019 then later restarted glipizde 10mg  BID then down to 10mg  AM and now 5 mg in AM -has not been checking CBGs- has CGM but having issues Lab Results  Component Value Date   HGBA1C 6.4 (H) 01/26/2020  A/P: hopefully stable- update a1c   #Hypertension associated with diabetes S: Compliant with amlodipine 10 mg and metoprolol 12.5 mg twice daily . Blood pressure higher at the cancer center BP Readings from Last 3 Encounters:  06/14/20 108/60  06/07/20 (!) 149/75  05/03/20 (!) 152/76   A/P: Stable. Continue current medications.    Recommended follow up: Return in about 14 weeks (around 09/20/2020) for follow up- or sooner if needed. Future Appointments  Date Time Provider [place]  07/27/2020  9:00 AM Marzetta Board, DPM [id number]   Lab/Order associations:   ICD-10-CM   1. Hypertension associated with diabetes ([facility])  E11.59    I15.2   2. Hyperlipidemia, unspecified hyperlipidemia type  E78.5   3. Chronic kidney disease (CKD) stage G3b/A1, moderately decreased glomerular filtration rate (GFR) between 30-44 mL/min/1.73 square meter and albuminuria creatinine ratio less than 30 mg/g (HCC)  N18.32    Return precautions advised.  [name], MD

## 2020-06-14 NOTE — Addendum Note (Signed)
Addended by: Doran Clay A on: 06/14/2020 04:40 PM   Modules accepted: Orders

## 2020-06-17 ENCOUNTER — Other Ambulatory Visit: Payer: Self-pay | Admitting: Internal Medicine

## 2020-06-23 ENCOUNTER — Telehealth: Payer: Self-pay

## 2020-06-23 MED FILL — TEMOZOLOMIDE 100 MG CAPS: 100 | 28 days supply | Qty: 20 | Fill #3

## 2020-06-23 MED FILL — CAPECITABINE 500 MG TABLET: 500 | 14 days supply | Qty: 84 | Fill #1

## 2020-06-23 NOTE — Telephone Encounter (Signed)
Nutrition  Called patient for nutrition follow-up.  No answer. Left message with call back number  [facility]ades Mathew B. Shawn Dannenberg, RD, LDN Registered Dietitian [phone] (mobile)  

## 2020-07-13 ENCOUNTER — Other Ambulatory Visit: Payer: Self-pay | Admitting: Internal Medicine

## 2020-07-18 ENCOUNTER — Other Ambulatory Visit: Payer: Self-pay | Admitting: Family Medicine

## 2020-07-20 ENCOUNTER — Telehealth: Payer: Self-pay

## 2020-07-20 NOTE — Telephone Encounter (Signed)
ERROR

## 2020-07-21 MED FILL — CAPECITABINE 500 MG TABLET: 500 | 14 days supply | Qty: 84 | Fill #2

## 2020-07-21 MED FILL — TEMOZOLOMIDE 100 MG CAPS: 100 | 28 days supply | Qty: 20 | Fill #0

## 2020-07-27 ENCOUNTER — Ambulatory Visit (INDEPENDENT_AMBULATORY_CARE_PROVIDER_SITE_OTHER): Payer: Medicare Other | Admitting: Podiatry

## 2020-07-27 ENCOUNTER — Other Ambulatory Visit: Payer: Self-pay

## 2020-07-27 ENCOUNTER — Encounter: Payer: Self-pay | Admitting: Podiatry

## 2020-07-27 DIAGNOSIS — B351 Tinea unguium: Secondary | ICD-10-CM | POA: Diagnosis not present

## 2020-07-27 DIAGNOSIS — M79674 Pain in right toe(s): Secondary | ICD-10-CM

## 2020-07-27 DIAGNOSIS — M2041 Other hammer toe(s) (acquired), right foot: Secondary | ICD-10-CM

## 2020-07-27 DIAGNOSIS — M2042 Other hammer toe(s) (acquired), left foot: Secondary | ICD-10-CM | POA: Diagnosis not present

## 2020-07-27 DIAGNOSIS — E119 Type 2 diabetes mellitus without complications: Secondary | ICD-10-CM

## 2020-07-27 DIAGNOSIS — E1151 Type 2 diabetes mellitus with diabetic peripheral angiopathy without gangrene: Secondary | ICD-10-CM

## 2020-07-27 DIAGNOSIS — M79675 Pain in left toe(s): Secondary | ICD-10-CM
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
## 2020-08-02 ENCOUNTER — Other Ambulatory Visit: Payer: Self-pay | Admitting: Emergency Medicine

## 2020-08-10 ENCOUNTER — Inpatient Hospital Stay: Payer: Medicare Other | Attending: Internal Medicine | Admitting: Internal Medicine

## 2020-08-10 ENCOUNTER — Encounter: Payer: Self-pay | Admitting: Internal Medicine

## 2020-08-10 ENCOUNTER — Other Ambulatory Visit: Payer: Self-pay

## 2020-08-10 ENCOUNTER — Inpatient Hospital Stay: Payer: Medicare Other

## 2020-08-10 VITALS — BP 144/77 | HR 80 | Temp 98.2°F | Resp 18 | Ht 75.0 in | Wt 183.2 lb

## 2020-08-10 DIAGNOSIS — C3491 Malignant neoplasm of unspecified part of right bronchus or lung: Secondary | ICD-10-CM

## 2020-08-10 DIAGNOSIS — C3431 Malignant neoplasm of lower lobe, right bronchus or lung: Secondary | ICD-10-CM | POA: Diagnosis not present

## 2020-08-10 DIAGNOSIS — C7B02 Secondary carcinoid tumors of liver: Secondary | ICD-10-CM | POA: Diagnosis not present

## 2020-08-10 DIAGNOSIS — Z5111 Encounter for antineoplastic chemotherapy: Secondary | ICD-10-CM | POA: Diagnosis not present

## 2020-08-10 DIAGNOSIS — C7A090 Malignant carcinoid tumor of the bronchus and lung: Secondary | ICD-10-CM | POA: Diagnosis not present

## 2020-08-10 DIAGNOSIS — C7A8 Other malignant neuroendocrine tumors: Secondary | ICD-10-CM

## 2020-08-10 LAB — CMP (CANCER CENTER ONLY)
ALT: 16 U/L (ref 0–44)
AST: 18 U/L (ref 15–41)
Albumin: 3.8 g/dL (ref 3.5–5.0)
Alkaline Phosphatase: 72 U/L (ref 38–126)
Anion gap: 9 (ref 5–15)
BUN: 24 mg/dL — ABNORMAL HIGH (ref 8–23)
CO2: 27 mmol/L (ref 22–32)
Calcium: 9.5 mg/dL (ref 8.9–10.3)
Chloride: 104 mmol/L (ref 98–111)
Creatinine: 1.98 mg/dL — ABNORMAL HIGH (ref 0.61–1.24)
GFR, Estimated: 34 mL/min — ABNORMAL LOW (ref >60)
Glucose, Bld: 123 mg/dL — ABNORMAL HIGH (ref 70–99)
Potassium: 4 mmol/L (ref 3.5–5.1)
Sodium: 140 mmol/L (ref 135–145)
Total Bilirubin: 0.5 mg/dL (ref 0.3–1.2)
Total Protein: 8 g/dL (ref 6.5–8.1)

## 2020-08-10 LAB — CBC WITH DIFFERENTIAL (CANCER CENTER ONLY)
Abs Immature Granulocytes: 0.02 10*3/uL (ref 0.00–0.07)
Basophils Absolute: 0 10*3/uL (ref 0.0–0.1)
Basophils Relative: 0 %
Eosinophils Absolute: 0.1 10*3/uL (ref 0.0–0.5)
Eosinophils Relative: 1 %
HCT: 33.8 % — ABNORMAL LOW (ref 39.0–52.0)
Hemoglobin: 11.6 g/dL — ABNORMAL LOW (ref 13.0–17.0)
Immature Granulocytes: 0 %
Lymphocytes Relative: 20 %
Lymphs Abs: 0.9 10*3/uL (ref 0.7–4.0)
MCH: 33 pg (ref 26.0–34.0)
MCHC: 34.3 g/dL (ref 30.0–36.0)
MCV: 96.3 fL (ref 80.0–100.0)
Monocytes Absolute: 0.5 10*3/uL (ref 0.1–1.0)
Monocytes Relative: 11 %
Neutro Abs: 3.2 10*3/uL (ref 1.7–7.7)
Neutrophils Relative %: 68 %
Platelet Count: 197 10*3/uL (ref 150–400)
RBC: 3.51 MIL/uL — ABNORMAL LOW (ref 4.22–5.81)
RDW: 14.4 % (ref 11.5–15.5)
WBC Count: 4.7 10*3/uL (ref 4.0–10.5)
nRBC: 0.4 % — ABNORMAL HIGH (ref 0.0–0.2)
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
## 2020-08-11 ENCOUNTER — Other Ambulatory Visit: Payer: Self-pay | Admitting: Internal Medicine

## 2020-08-11 DIAGNOSIS — C7A8 Other malignant neuroendocrine tumors: Secondary | ICD-10-CM

## 2020-08-16 ENCOUNTER — Telehealth: Payer: Self-pay | Admitting: Internal Medicine

## 2020-08-16 NOTE — Telephone Encounter (Signed)
Scheduled appt per 12/21 los - pt is aware of appt date and time

## 2020-08-18 MED FILL — TEMOZOLOMIDE 100 MG CAPS: 100 | 28 days supply | Qty: 20 | Fill #1

## 2020-08-18 MED FILL — CAPECITABINE 500 MG TABLET: 500 | 14 days supply | Qty: 84 | Fill #0

## 2020-08-19 ENCOUNTER — Other Ambulatory Visit: Payer: Self-pay | Admitting: Interventional Radiology

## 2020-08-19 DIAGNOSIS — C7A8 Other malignant neuroendocrine tumors: Secondary | ICD-10-CM

## 2020-08-30 ENCOUNTER — Ambulatory Visit (HOSPITAL_COMMUNITY)
Admission: RE | Admit: 2020-08-30 | Discharge: 2020-08-30 | Disposition: A | Discharge status: Home / Self Care | Payer: Medicare Other | Source: Ambulatory Visit | Attending: Internal Medicine | Admitting: Internal Medicine

## 2020-08-30 ENCOUNTER — Encounter (HOSPITAL_COMMUNITY): Payer: Self-pay

## 2020-08-30 ENCOUNTER — Other Ambulatory Visit: Payer: Self-pay

## 2020-08-30 ENCOUNTER — Inpatient Hospital Stay: Payer: Medicare Other | Attending: Internal Medicine

## 2020-08-30 DIAGNOSIS — I10 Essential (primary) hypertension: Secondary | ICD-10-CM | POA: Diagnosis not present

## 2020-08-30 DIAGNOSIS — Z79899 Other long term (current) drug therapy: Secondary | ICD-10-CM | POA: Diagnosis not present

## 2020-08-30 DIAGNOSIS — C7A090 Malignant carcinoid tumor of the bronchus and lung: Secondary | ICD-10-CM | POA: Insufficient documentation

## 2020-08-30 DIAGNOSIS — N189 Chronic kidney disease, unspecified: Secondary | ICD-10-CM | POA: Insufficient documentation

## 2020-08-30 DIAGNOSIS — J439 Emphysema, unspecified: Secondary | ICD-10-CM | POA: Insufficient documentation

## 2020-08-30 DIAGNOSIS — N200 Calculus of kidney: Secondary | ICD-10-CM | POA: Diagnosis not present

## 2020-08-30 DIAGNOSIS — E119 Type 2 diabetes mellitus without complications: Secondary | ICD-10-CM | POA: Diagnosis not present

## 2020-08-30 DIAGNOSIS — C7A8 Other malignant neuroendocrine tumors: Secondary | ICD-10-CM | POA: Diagnosis not present

## 2020-08-30 DIAGNOSIS — Z7984 Long term (current) use of oral hypoglycemic drugs: Secondary | ICD-10-CM | POA: Insufficient documentation

## 2020-08-30 DIAGNOSIS — K219 Gastro-esophageal reflux disease without esophagitis: Secondary | ICD-10-CM | POA: Insufficient documentation

## 2020-08-30 DIAGNOSIS — C7B02 Secondary carcinoid tumors of liver: Secondary | ICD-10-CM | POA: Insufficient documentation

## 2020-08-30 DIAGNOSIS — F32A Depression, unspecified: Secondary | ICD-10-CM | POA: Insufficient documentation

## 2020-08-30 DIAGNOSIS — E785 Hyperlipidemia, unspecified: Secondary | ICD-10-CM | POA: Insufficient documentation

## 2020-08-30 LAB — CBC WITH DIFFERENTIAL (CANCER CENTER ONLY)
Abs Immature Granulocytes: 0.01 10*3/uL (ref 0.00–0.07)
Basophils Absolute: 0 10*3/uL (ref 0.0–0.1)
Basophils Relative: 1 %
Eosinophils Absolute: 0 10*3/uL (ref 0.0–0.5)
Eosinophils Relative: 1 %
HCT: 35.1 % — ABNORMAL LOW (ref 39.0–52.0)
Hemoglobin: 12.1 g/dL — ABNORMAL LOW (ref 13.0–17.0)
Immature Granulocytes: 0 %
Lymphocytes Relative: 18 %
Lymphs Abs: 0.7 10*3/uL (ref 0.7–4.0)
MCH: 33.8 pg (ref 26.0–34.0)
MCHC: 34.5 g/dL (ref 30.0–36.0)
MCV: 98 fL (ref 80.0–100.0)
Monocytes Absolute: 0.4 10*3/uL (ref 0.1–1.0)
Monocytes Relative: 10 %
Neutro Abs: 3 10*3/uL (ref 1.7–7.7)
Neutrophils Relative %: 70 %
Platelet Count: 156 10*3/uL (ref 150–400)
RBC: 3.58 MIL/uL — ABNORMAL LOW (ref 4.22–5.81)
RDW: 14.3 % (ref 11.5–15.5)
WBC Count: 4.2 10*3/uL (ref 4.0–10.5)
nRBC: 0 % (ref 0.0–0.2)

## 2020-08-30 LAB — CMP (CANCER CENTER ONLY)
ALT: 15 U/L (ref 0–44)
AST: 15 U/L (ref 15–41)
Albumin: 3.7 g/dL (ref 3.5–5.0)
Alkaline Phosphatase: 72 U/L (ref 38–126)
Anion gap: 7 (ref 5–15)
BUN: 27 mg/dL — ABNORMAL HIGH (ref 8–23)
CO2: 27 mmol/L (ref 22–32)
Calcium: 9.7 mg/dL (ref 8.9–10.3)
Chloride: 105 mmol/L (ref 98–111)
Creatinine: 1.94 mg/dL — ABNORMAL HIGH (ref 0.61–1.24)
GFR, Estimated: 35 mL/min — ABNORMAL LOW (ref >60)
Glucose, Bld: 121 mg/dL — ABNORMAL HIGH (ref 70–99)
Potassium: 3.9 mmol/L (ref 3.5–5.1)
Sodium: 139 mmol/L (ref 135–145)
Total Bilirubin: 0.5 mg/dL (ref 0.3–1.2)
Total Protein: 7.8 g/dL (ref 6.5–8.1)

## 2020-09-01 ENCOUNTER — Other Ambulatory Visit: Payer: Self-pay

## 2020-09-01 ENCOUNTER — Inpatient Hospital Stay (HOSPITAL_BASED_OUTPATIENT_CLINIC_OR_DEPARTMENT_OTHER): Payer: Medicare Other | Admitting: Internal Medicine

## 2020-09-01 VITALS — BP 147/77 | HR 77 | Temp 97.6°F | Resp 20 | Wt 181.8 lb

## 2020-09-01 DIAGNOSIS — E119 Type 2 diabetes mellitus without complications: Secondary | ICD-10-CM | POA: Diagnosis not present

## 2020-09-01 DIAGNOSIS — Z5111 Encounter for antineoplastic chemotherapy: Secondary | ICD-10-CM

## 2020-09-01 DIAGNOSIS — I10 Essential (primary) hypertension: Secondary | ICD-10-CM | POA: Diagnosis not present

## 2020-09-01 DIAGNOSIS — C7B02 Secondary carcinoid tumors of liver: Secondary | ICD-10-CM | POA: Diagnosis not present

## 2020-09-01 DIAGNOSIS — C3431 Malignant neoplasm of lower lobe, right bronchus or lung: Secondary | ICD-10-CM | POA: Diagnosis not present

## 2020-09-01 DIAGNOSIS — E785 Hyperlipidemia, unspecified: Secondary | ICD-10-CM | POA: Diagnosis not present

## 2020-09-01 DIAGNOSIS — C7A8 Other malignant neuroendocrine tumors: Secondary | ICD-10-CM | POA: Diagnosis not present

## 2020-09-01 DIAGNOSIS — C7A090 Malignant carcinoid tumor of the bronchus and lung: Secondary | ICD-10-CM | POA: Diagnosis not present

## 2020-09-01 DIAGNOSIS — F32A Depression, unspecified: Secondary | ICD-10-CM | POA: Diagnosis not present
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
## 2020-09-07 ENCOUNTER — Other Ambulatory Visit: Payer: Self-pay | Admitting: Internal Medicine

## 2020-09-07 DIAGNOSIS — R112 Nausea with vomiting, unspecified: Secondary | ICD-10-CM

## 2020-09-07 MED FILL — ONDANSETRON HCL 8 MG TABLET: 8 | 10 days supply | Qty: 30 | Fill #0

## 2020-09-08 ENCOUNTER — Other Ambulatory Visit: Payer: Self-pay | Admitting: Family Medicine

## 2020-09-09 ENCOUNTER — Telehealth: Payer: Self-pay | Admitting: Internal Medicine

## 2020-09-09 NOTE — Telephone Encounter (Signed)
Scheduled per 1/12 los. Called and spoke with [name], confirmed 2/14 appts

## 2020-09-13 ENCOUNTER — Ambulatory Visit: Payer: TRICARE For Life (TFL) | Admitting: Emergency Medicine

## 2020-09-14 ENCOUNTER — Other Ambulatory Visit: Payer: Self-pay

## 2020-09-14 ENCOUNTER — Encounter: Payer: Self-pay | Admitting: *Deleted

## 2020-09-14 ENCOUNTER — Ambulatory Visit
Admission: RE | Admit: 2020-09-14 | Discharge: 2020-09-14 | Disposition: A | Discharge status: Home / Self Care | Payer: Medicare Other | Source: Ambulatory Visit | Attending: Interventional Radiology | Admitting: Interventional Radiology

## 2020-09-14 DIAGNOSIS — Z9889 Other specified postprocedural states: Secondary | ICD-10-CM | POA: Diagnosis not present

## 2020-09-14 DIAGNOSIS — C7A090 Malignant carcinoid tumor of the bronchus and lung: Secondary | ICD-10-CM | POA: Diagnosis not present

## 2020-09-14 DIAGNOSIS — C7B02 Secondary carcinoid tumors of liver: Secondary | ICD-10-CM | POA: Diagnosis not present

## 2020-09-14 DIAGNOSIS — C7A8 Other malignant neuroendocrine tumors: Secondary | ICD-10-CM

## 2020-09-14 HISTORY — PX: IR RADIOLOGIST EVAL & MGMT: IMG5224
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
## 2020-09-15 MED FILL — TEMOZOLOMIDE 100 MG CAPS: 100 | 28 days supply | Qty: 20 | Fill #2

## 2020-09-15 MED FILL — CAPECITABINE 500 MG TABLET: 500 | 14 days supply | Qty: 84 | Fill #1

## 2020-09-20 NOTE — Patient Instructions (Addendum)
You are eligible to schedule your annual wellness visit with our nurse specialist Otila Kluver.  Please consider scheduling this before you leave today  Health Maintenance Due  Topic Date Due  . OPHTHALMOLOGY EXAM Please schedule this- would prefer yearly instead of every other year 09/07/2020   blood pressure just a hair high- we will trial metoprolol 25 mg twice daily and continue amlodipine 10 mg . Recheck next visit- if youd prefer could follow up in a moth for recheck  Please stop by lab before you go If you have mychart- we will send your results within 3 business days of [place] receiving them.  If you do not have mychart- we will call you about results within 5 business days of [place] receiving them.  *please also note that you will see labs on mychart as soon as they post. I will later go in and write notes on them- will say "notes from Dr. Yong Channel" h Recommended follow up: Return in about 14 weeks (around 12/28/2020) for follow up- or sooner if needed.

## 2020-09-20 NOTE — Progress Notes (Signed)
Phone [phone] In person visit   Subjective:   Richard Navarro is a 75 y.o. year old very pleasant male patient who presents for/with See problem oriented charting Chief Complaint  Patient presents with  . Chronic Kidney Disease    Stage 3  . Hypertension  . Diabetes   This visit occurred during the SARS-CoV-2 public health emergency.  Safety protocols were in place, including screening questions prior to the visit, additional usage of staff PPE, and extensive cleaning of exam room while observing appropriate contact time as indicated for disinfecting solutions.   Past Medical History-  Patient Active Problem List   Diagnosis Date Noted  . Anemia 05/07/2018    Priority: High  . Chronic kidney disease (CKD) stage G3b/A1, moderately decreased glomerular filtration rate (GFR) between 30-44 mL/min/1.73 square meter and albuminuria creatinine ratio less than 30 mg/g (HCC) 03/04/2018    Priority: High  . 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 Primary cancer of right lung ([place]) 12/22/2016    Priority: High  . Diabetes mellitus with renal manifestation (HCC)     Priority: High  . Myalgia due to statin 03/04/2018    Priority: Medium  . COPD (chronic obstructive pulmonary disease) ([place]) 12/01/2016    Priority: Medium  . PTSD (post-traumatic stress disorder) 12/07/2015    Priority: Medium  . Erectile dysfunction 04/09/2014    Priority: Medium  . Former smoker 03/26/2014    Priority: Medium  . Hypertension associated with diabetes ([place])     Priority: Medium  . Gout     Priority: Medium  . Depression     Priority: Medium  . Hyperlipidemia     Priority: Medium  . Thrombocytopenia ([place])     Priority: Low  . Pneumonia 08/09/2017    Priority: Low  . Encounter for antineoplastic chemotherapy 01/10/2017    Priority: Low  . Goals of care, counseling/discussion 01/10/2017    Priority: Low  . Prostate cancer screening 04/17/2014    Priority: Low  .  Arthritis 03/26/2014    Priority: Low  . History of adenomatous polyp of colon 03/26/2014    Priority: Low  . GERD (gastroesophageal reflux disease)     Priority: Low  . Type II diabetes mellitus with peripheral circulatory disorder ([place]) 09/21/2020  . 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 Sepsis due to pneumonia ([place]) 01/26/2020  . Recurrent major depressive disorder, in full remission ([place]) 10/21/2019    Medications- reviewed and updated Current Outpatient Medications  Medication Sig Dispense Refill  . albuterol (VENTOLIN HFA) 108 (90 Base) MCG/ACT inhaler Inhale 2 puffs into the lungs every 4 (four) hours as needed for wheezing or shortness of breath. 3 Inhaler 2  . allopurinol (ZYLOPRIM) 100 MG tablet TAKE 1 TABLET DAILY 90 tablet 3  . amLODipine (NORVASC) 10 MG tablet TAKE 1 TABLET DAILY 90 tablet 3  . calcium carbonate (OS-CAL) 1250 (500 Ca) MG chewable tablet Chew 2 tablets by mouth 2 (two) times daily.    . capecitabine (XELODA) 500 MG tablet TAKE 3 TABLETS BY MOUTH TWICE DAILY FOR 14 DAYS EVERY 4 WEEKS. 84 tablet 2  . Cholecalciferol (VITAMIN D3) 25 MCG (1000 UT) CAPS Take 1 capsule by mouth daily.    . Continuous Blood Gluc Sensor (FREESTYLE LIBRE 14 DAY SENSOR) MISC USE TO CHECK BLOOD SUGAR AS DIRECTED AND CHANGE EVERY 14 DAYS 2 each 5  . Ferrous Sulfate (IRON) 325 (65 Fe) MG TABS Take  1 tablet by mouth every other day.     Marland Kitchen glipiZIDE (GLUCOTROL) 5 MG tablet Take 1 tablet (5 mg total) by mouth daily before breakfast. 90 tablet 3  . glucose blood test strip Use to test blood sugar twice a day 200 each 3  . guaiFENesin (MUCINEX) 600 MG 12 hr tablet Take 600 mg by mouth 2 (two) times daily.     . Hydrocortisone Micronized POWD SWISH AND SWALLOW 1 TEASPOONFUL EVERY 6 HOURS AS NEEDED. 100 g 0  . ketotifen (ZADITOR) 0.025 % ophthalmic solution Place 1 drop into both eyes daily as needed (allergies).    . magic mouthwash w/lidocaine SOLN Take 5 mLs by  mouth every 6 (six) hours as needed for mouth pain.    Marland Kitchen ondansetron (ZOFRAN) 8 MG tablet TAKE 1 TABLET BY MOUTH EVERY 8 HOURS AS NEEDED FOR NAUSEA AND/OR VOMITING 30 tablet 0  . pantoprazole (PROTONIX) 40 MG tablet Take 1 tablet (40 mg total) by mouth daily. 90 tablet 3  . polyethylene glycol (MIRALAX / GLYCOLAX) 17 g packet Take 17 g by mouth daily.    Marland Kitchen SPIRIVA RESPIMAT 2.5 MCG/ACT AERS USE 2 INHALATIONS DAILY 12 g 0  . sucralfate (CARAFATE) 1 g tablet Take 1 tablet (1 g total) by mouth 4 (four) times daily -  with meals and at bedtime. 30 tablet 0  . temozolomide (TEMODAR) 100 MG capsule TAKE FOUR CAPSULES BY MOUTH DAILY FROM DAYS 30,86,57,84 AND 14 EVERY 4 WEEKS. MAY TAKE ON AN EMPTY STOMACH OR AT BEDTIME TO DECREASE NAUSEA & VOMITING 20 capsule 3  . vitamin B-12 (CYANOCOBALAMIN) 1000 MCG tablet Take 1,000 mcg by mouth daily.     . vitamin C (ASCORBIC ACID) 500 MG tablet Take 500 mg by mouth daily.    . metoprolol tartrate (LOPRESSOR) 12.5 MG tablet Take 1 tablet (12.5 mg total) by mouth 2 (two) times daily. 180 tablet 3   No current facility-administered medications for this visit.     Objective:  BP 140/82   Pulse 79   Temp 97.9 F (36.6 C) (Temporal)   Ht 6\' 3"  (1.905 m)   Wt 185 lb 3.2 oz (84 kg)   SpO2 97%   BMI 23.15 kg/m  Gen: NAD, resting comfortably CV: RRR no murmurs rubs or gallops Lungs: CTAB no crackles, wheeze, rhonchi Ext: no edema Skin: warm, dry  Diabetic Foot Exam - Simple   Simple Foot Form Diabetic Foot exam was performed with the following findings: Yes 09/21/2020 11:13 AM  Visual Inspection No deformities, no ulcerations, no other skin breakdown bilaterally: Yes Sensation Testing Intact to touch and monofilament testing bilaterally: Yes Pulse Check Posterior Tibialis and Dorsalis pulse intact bilaterally: Yes Comments DP pulses only 1+, hammer toes noted       Assessment and Plan   # Lung cancer- neuroendocrine carcinoma stable on recent CT  within last few months- continuing xeloda and temodar.   #Chronic kidney disease stage III S: Patient with significant worsening in kidney function dating back to November 2020 when patient was on Afinitor previously- later went back on this but did not worsen as much (did raise CBGs though and determined not effective so switched). Follows with [facility] -GFR 35 January 10th A/P: stable, continue to avoid nsaids, control BP and DM   #Diabetes mellitus S: No Rx as of 06/23/2019 then later restarted glipizde 10mg  BID then down to 10mg  AM and now 5 mg in AM -no lows reported - 123 when last  checked Lab Results  Component Value Date   HGBA1C 6.0 (A) 06/14/2020  A/P: hopefully remains controlled - update a1c. Likely continue current medicines  #Hypertension associated with diabetes S: Compliant with amlodipine 10 mg and metoprolol 12.5 mg twice daily -home #s vary from 120s-160s BP Readings from Last 3 Encounters:  09/21/20 140/82  09/01/20 (!) 147/77  08/10/20 (!) 144/77  A/P: blood pressure just a hair high- we will trial metoprolol 25 mg twice daily and continue amlodipine 10 mg  -hoping perhaps to bring down range 110-150 as long as he does not feel poorly on this. Think HR can tolerate increase.   #Gout S: Compliant with allopurinol 100 mg. No recent gout flares reported  Lab Results  Component Value Date   LABURIC 4.8 03/04/2018  A/P: appears controlled with no flares unlikely to change meds- update uric acid -if GFR gets below 30 may reduce dose to 50mg    #hyperlipidemia but with myalgia due do statin S: Medication: none  Lab Results  Component Value Date   CHOL 191 05/12/2019   HDL 24.80 (L) 05/12/2019   LDLCALC 143 (H) 03/26/2014   LDLDIRECT 122.0 05/12/2019   TRIG 249.0 (H) 05/12/2019   CHOLHDL 8 05/12/2019   A/P: update with labs day  #COPD- on spiriva 2 inhalations daily. has albuterol available but sparingly uses- depends on situation such as if he has a  really prolonged converssation. Continue to monitor- appears stable. Lung cancer also likely causes some SOB  #thrombocytopenia- mild intermittent issue- low normal on last check- continue to monitor. Anemia also noted but mild and stable Lab Results  Component Value Date   WBC 4.2 08/30/2020   HGB 12.1 (L) 08/30/2020   HCT 35.1 (L) 08/30/2020   MCV 98.0 08/30/2020   PLT 156 08/30/2020   # Depression/PTSD S: Medication:none  A/P: states comes in waves- he needs his space sometimes and "quiet time" and wants to deal with things internally. Has been doing reasonably well lately. Does not want medicine or counseling  Recommended follow up: Return in about 14 weeks (around 12/28/2020) for follow up- or sooner if needed. Future Appointments  Date Time Provider [place]  09/22/2020 11:00 AM Collene Gobble, MD [facility] None  10/04/2020 10:30 AM [facility] None  10/04/2020 11:00 AM Curt Bears, MD [facility] None  10/26/2020  9:45 AM Elisha Ponder, Stephani Police, DPM [facility]    Lab/Order associations:   ICD-10-CM   1. Hypertension associated with diabetes ([place])  E11.59    I15.2   2. Type II diabetes mellitus with peripheral circulatory disorder (HCC)  E11.51 CBC with Differential/Platelet    Comprehensive metabolic panel    Lipid panel    Hemoglobin A1c  3. Chronic obstructive pulmonary disease, unspecified COPD type (HCC) Chronic J44.9   4. Recurrent major depressive disorder, in full remission ([place]) Chronic F33.42   5. Thrombocytopenia, unspecified (HCC) Chronic D69.6 CBC with Differential/Platelet  6. Idiopathic chronic gout of multiple sites without tophus  M1A.37J6 Uric acid    Meds ordered this encounter  Medications  . metoprolol tartrate (LOPRESSOR) 25 MG tablet    Sig: Take 1 tablet (25 mg total) by mouth 2 (two) times daily.    Dispense:  180 tablet    Refill:  3    Return precautions advised.  [name], MD

## 2020-09-21 ENCOUNTER — Ambulatory Visit (INDEPENDENT_AMBULATORY_CARE_PROVIDER_SITE_OTHER): Payer: Medicare Other | Admitting: Family Medicine

## 2020-09-21 ENCOUNTER — Encounter: Payer: Self-pay | Admitting: Family Medicine

## 2020-09-21 ENCOUNTER — Other Ambulatory Visit: Payer: Self-pay

## 2020-09-21 VITALS — BP 140/82 | HR 79 | Temp 97.9°F | Ht 75.0 in | Wt 185.2 lb

## 2020-09-21 DIAGNOSIS — E1151 Type 2 diabetes mellitus with diabetic peripheral angiopathy without gangrene: Secondary | ICD-10-CM | POA: Diagnosis not present

## 2020-09-21 DIAGNOSIS — D696 Thrombocytopenia, unspecified: Secondary | ICD-10-CM | POA: Diagnosis not present

## 2020-09-21 DIAGNOSIS — J449 Chronic obstructive pulmonary disease, unspecified: Secondary | ICD-10-CM

## 2020-09-21 DIAGNOSIS — I152 Hypertension secondary to endocrine disorders: Secondary | ICD-10-CM

## 2020-09-21 DIAGNOSIS — E1159 Type 2 diabetes mellitus with other circulatory complications: Secondary | ICD-10-CM

## 2020-09-21 DIAGNOSIS — F3342 Major depressive disorder, recurrent, in full remission: Secondary | ICD-10-CM | POA: Diagnosis not present

## 2020-09-21 DIAGNOSIS — M1A09X0 Idiopathic chronic gout, multiple sites, without tophus (tophi): Secondary | ICD-10-CM

## 2020-09-21 LAB — COMPREHENSIVE METABOLIC PANEL
ALT: 13 U/L (ref 0–53)
AST: 15 U/L (ref 0–37)
Albumin: 4 g/dL (ref 3.5–5.2)
Alkaline Phosphatase: 71 U/L (ref 39–117)
BUN: 23 mg/dL (ref 6–23)
CO2: 28 mEq/L (ref 19–32)
Calcium: 9.5 mg/dL (ref 8.4–10.5)
Chloride: 102 mEq/L (ref 96–112)
Creatinine, Ser: 1.76 mg/dL — ABNORMAL HIGH (ref 0.40–1.50)
GFR: 37.09 mL/min — ABNORMAL LOW (ref >60.00)
Glucose, Bld: 156 mg/dL — ABNORMAL HIGH (ref 70–99)
Potassium: 3.7 mEq/L (ref 3.5–5.1)
Sodium: 134 mEq/L — ABNORMAL LOW (ref 135–145)
Total Bilirubin: 0.5 mg/dL (ref 0.2–1.2)
Total Protein: 7.6 g/dL (ref 6.0–8.3)

## 2020-09-21 LAB — CBC WITH DIFFERENTIAL/PLATELET
Basophils Absolute: 0 10*3/uL (ref 0.0–0.1)
Basophils Relative: 0.7 % (ref 0.0–3.0)
Eosinophils Absolute: 0 10*3/uL (ref 0.0–0.7)
Eosinophils Relative: 0.9 % (ref 0.0–5.0)
HCT: 37.3 % — ABNORMAL LOW (ref 39.0–52.0)
Hemoglobin: 12.7 g/dL — ABNORMAL LOW (ref 13.0–17.0)
Lymphocytes Relative: 14.5 % (ref 12.0–46.0)
Lymphs Abs: 0.6 10*3/uL — ABNORMAL LOW (ref 0.7–4.0)
MCHC: 34.1 g/dL (ref 30.0–36.0)
MCV: 98.1 fl (ref 78.0–100.0)
Monocytes Absolute: 0.5 10*3/uL (ref 0.1–1.0)
Monocytes Relative: 11.1 % (ref 3.0–12.0)
Neutro Abs: 3 10*3/uL (ref 1.4–7.7)
Neutrophils Relative %: 72.8 % (ref 43.0–77.0)
Platelets: 198 10*3/uL (ref 150.0–400.0)
RBC: 3.8 Mil/uL — ABNORMAL LOW (ref 4.22–5.81)
RDW: 16.3 % — ABNORMAL HIGH (ref 11.5–15.5)
WBC: 4.1 10*3/uL (ref 4.0–10.5)

## 2020-09-21 LAB — LIPID PANEL
Cholesterol: 198 mg/dL (ref 0–200)
HDL: 28.9 mg/dL — ABNORMAL LOW (ref >39.00)
LDL Cholesterol: 132 mg/dL — ABNORMAL HIGH (ref 0–99)
NonHDL: 168.86
Total CHOL/HDL Ratio: 7
Triglycerides: 183 mg/dL — ABNORMAL HIGH (ref 0.0–149.0)
VLDL: 36.6 mg/dL (ref 0.0–40.0)

## 2020-09-21 LAB — URIC ACID: Uric Acid, Serum: 5.3 mg/dL (ref 4.0–7.8)

## 2020-09-21 LAB — HEMOGLOBIN A1C: Hgb A1c MFr Bld: 6.3 % (ref 4.6–6.5)

## 2020-09-21 MED ORDER — METOPROLOL TARTRATE 25 MG PO TABS
25.0000 mg | ORAL_TABLET | Freq: Two times a day (BID) | ORAL | 3 refills | Status: DC
Start: 2020-09-21 — End: 2020-10-04

## 2020-09-22 ENCOUNTER — Ambulatory Visit (INDEPENDENT_AMBULATORY_CARE_PROVIDER_SITE_OTHER): Payer: Medicare Other | Admitting: Emergency Medicine

## 2020-09-22 ENCOUNTER — Encounter: Payer: Self-pay | Admitting: Emergency Medicine

## 2020-09-22 DIAGNOSIS — C3431 Malignant neoplasm of lower lobe, right bronchus or lung: Secondary | ICD-10-CM | POA: Diagnosis not present

## 2020-09-22 DIAGNOSIS — J449 Chronic obstructive pulmonary disease, unspecified: Secondary | ICD-10-CM | POA: Diagnosis not present

## 2020-09-22 NOTE — Assessment & Plan Note (Signed)
No flares.  Stable exertional shortness of breath.  Does not seem to be interrupting his usual daily activities.  We will continue Spiriva for now.  We could consider adding LABA to his regimen going forward as symptoms evolve.  Keep albuterol available to use as needed.  Continue your Spiriva 2 puffs once daily. Keep your albuterol available use 2 puffs when you need if short of breath, chest tightness, wheezing. COVID-19 vaccine is up-to-date. Follow with Dr. Lamonte Sakai in 12 months or sooner if you have any problems.

## 2020-09-22 NOTE — Patient Instructions (Signed)
Continue to follow Dr. [name] and have your interval scans per his plans. Continue your Spiriva 2 puffs once daily. Keep your albuterol available use 2 puffs when you need if short of breath, chest tightness, wheezing. COVID-19 vaccine is up-to-date. Follow with Dr. Lamonte Sakai in 12 months or sooner if you have any problems.

## 2020-09-22 NOTE — Progress Notes (Signed)
Subjective:    Patient ID: Richard Navarro, male    DOB: [birth year], 74 y.o.   MRN: [id number]  HPI  ROV 07/22/2019 --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  His most recent chest imaging 05/02/2019 reviewed by me, show mild increase in size of a right lower lobe mass, stable subcarinal lymphadenopathy, stable liver findings, sclerotic T7 lesion. Since last time (and that scan) he developed acute renal failure, and the Afinitor had to be stopped.  His next CT scan is planned for 12/28.  He has been managed on Spiriva Respimat, feels that his breathing has benefited. He can still get some dyspnea with conversation. He rests after walking. Less cough than before, less phlegm. On mucinex. Has seen some blood from nose and in phlegm. No CP. No albuterol use. PNA shot and flu shot up to date.   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  He is being treated by Dr. Earlie Server with Xeloda, Temodar since 10/2019.  Also with COPD.  He is on Spiriva Respimat.  He has albuterol which he uses rarely. He tells me that his breathing gets short winded with a long conversation. He is able to shop, exert, walk indefinitely at a slow pace. No flares, pred or abx.  COVID vaccine up to date.   CT chest abdomen pelvis 08/30/2020 reviewed by me, shows stable infrahilar nodule, decrease in size of a right lower lobe mass lesion now 5.6 x 4.4 cm, small right pleural effusion, no new nodules.  Stable 13 mm hypoattenuating right liver lesion and stable T8 and iliac sclerotic lesions.  MDM: Reviewed oncology note from 09/01/2020, reviewed family practice note from 09/21/2020.  Review of Systems As per HPI     Objective:   Physical Exam   Vitals:   09/22/20 1051  BP: 138/74  Pulse: 75  Temp: 97.8 F (36.6 C)  SpO2: 98%  Weight: 185  lb 12.8 oz (84.3 kg)  Height: 6\' 3"  (1.905 m)   Gen: Pleasant, thin, in no distress,  normal affect  ENT: Small ulceration left posterior pharynx. Mouth clear,  oropharynx clear, no postnasal drip  Neck: No JVD, no stridor  Lungs: No use of accessory muscles, no crackles or wheezes.   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  He is following Dr. Earlie Server who is managing his chemotherapy.  Tolerates well.  Repeat imaging as per their plans.  COPD (chronic obstructive pulmonary disease) (HCC) No flares.  Stable exertional shortness of breath.  Does not seem to be interrupting his usual daily activities.  We will continue Spiriva for now.  We could consider adding LABA to his regimen going forward as symptoms evolve.  Keep albuterol available to use as needed.  Continue your Spiriva 2 puffs once daily. Keep your albuterol available use 2 puffs when you need if short of breath, chest tightness, wheezing. COVID-19 vaccine is up-to-date. Follow with Dr. Lamonte Sakai in 12 months or sooner if you have any problems.  Richard Apo, MD, PhD 09/22/2020, 11:18 AM [facility] [phone] or if no answer [phone]

## 2020-09-22 NOTE — Assessment & Plan Note (Signed)
Stable by CT, actually somewhat improved on his current regimen.  He is following Dr. Earlie Server who is managing his chemotherapy.  Tolerates well.  Repeat imaging as per their plans.

## 2020-10-04 ENCOUNTER — Other Ambulatory Visit: Payer: Self-pay

## 2020-10-04 ENCOUNTER — Inpatient Hospital Stay: Payer: Medicare Other

## 2020-10-04 ENCOUNTER — Inpatient Hospital Stay: Payer: Medicare Other | Attending: Internal Medicine | Admitting: Internal Medicine

## 2020-10-04 VITALS — BP 135/69 | HR 75 | Temp 98.1°F | Resp 16 | Ht 75.0 in | Wt 182.7 lb

## 2020-10-04 DIAGNOSIS — E119 Type 2 diabetes mellitus without complications: Secondary | ICD-10-CM | POA: Diagnosis not present

## 2020-10-04 DIAGNOSIS — J439 Emphysema, unspecified: Secondary | ICD-10-CM | POA: Diagnosis not present

## 2020-10-04 DIAGNOSIS — E785 Hyperlipidemia, unspecified: Secondary | ICD-10-CM | POA: Diagnosis not present

## 2020-10-04 DIAGNOSIS — I10 Essential (primary) hypertension: Secondary | ICD-10-CM | POA: Insufficient documentation

## 2020-10-04 DIAGNOSIS — Z79899 Other long term (current) drug therapy: Secondary | ICD-10-CM | POA: Diagnosis not present

## 2020-10-04 DIAGNOSIS — K219 Gastro-esophageal reflux disease without esophagitis: Secondary | ICD-10-CM | POA: Insufficient documentation

## 2020-10-04 DIAGNOSIS — C7A8 Other malignant neuroendocrine tumors: Secondary | ICD-10-CM | POA: Diagnosis not present

## 2020-10-04 DIAGNOSIS — C7B02 Secondary carcinoid tumors of liver: Secondary | ICD-10-CM | POA: Diagnosis not present

## 2020-10-04 DIAGNOSIS — Z5111 Encounter for antineoplastic chemotherapy: Secondary | ICD-10-CM

## 2020-10-04 DIAGNOSIS — C7A090 Malignant carcinoid tumor of the bronchus and lung: Secondary | ICD-10-CM | POA: Insufficient documentation

## 2020-10-04 DIAGNOSIS — C3491 Malignant neoplasm of unspecified part of right bronchus or lung: Secondary | ICD-10-CM

## 2020-10-04 DIAGNOSIS — Z7984 Long term (current) use of oral hypoglycemic drugs: Secondary | ICD-10-CM | POA: Insufficient documentation

## 2020-10-04 LAB — CMP (CANCER CENTER ONLY)
ALT: 14 U/L (ref 0–44)
AST: 17 U/L (ref 15–41)
Albumin: 3.6 g/dL (ref 3.5–5.0)
Alkaline Phosphatase: 74 U/L (ref 38–126)
Anion gap: 7 (ref 5–15)
BUN: 24 mg/dL — ABNORMAL HIGH (ref 8–23)
CO2: 26 mmol/L (ref 22–32)
Calcium: 9.2 mg/dL (ref 8.9–10.3)
Chloride: 104 mmol/L (ref 98–111)
Creatinine: 1.98 mg/dL — ABNORMAL HIGH (ref 0.61–1.24)
GFR, Estimated: 34 mL/min — ABNORMAL LOW (ref >60)
Glucose, Bld: 197 mg/dL — ABNORMAL HIGH (ref 70–99)
Potassium: 4.3 mmol/L (ref 3.5–5.1)
Sodium: 137 mmol/L (ref 135–145)
Total Bilirubin: 0.4 mg/dL (ref 0.3–1.2)
Total Protein: 7.4 g/dL (ref 6.5–8.1)

## 2020-10-04 LAB — CBC WITH DIFFERENTIAL (CANCER CENTER ONLY)
Abs Immature Granulocytes: 0.02 10*3/uL (ref 0.00–0.07)
Basophils Absolute: 0 10*3/uL (ref 0.0–0.1)
Basophils Relative: 0 %
Eosinophils Absolute: 0 10*3/uL (ref 0.0–0.5)
Eosinophils Relative: 1 %
HCT: 33.9 % — ABNORMAL LOW (ref 39.0–52.0)
Hemoglobin: 11.7 g/dL — ABNORMAL LOW (ref 13.0–17.0)
Immature Granulocytes: 0 %
Lymphocytes Relative: 11 %
Lymphs Abs: 0.7 10*3/uL (ref 0.7–4.0)
MCH: 33.3 pg (ref 26.0–34.0)
MCHC: 34.5 g/dL (ref 30.0–36.0)
MCV: 96.6 fL (ref 80.0–100.0)
Monocytes Absolute: 0.4 10*3/uL (ref 0.1–1.0)
Monocytes Relative: 7 %
Neutro Abs: 5 10*3/uL (ref 1.7–7.7)
Neutrophils Relative %: 81 %
Platelet Count: 190 10*3/uL (ref 150–400)
RBC: 3.51 MIL/uL — ABNORMAL LOW (ref 4.22–5.81)
RDW: 14 % (ref 11.5–15.5)
WBC Count: 6.1 10*3/uL (ref 4.0–10.5)
nRBC: 0 % (ref 0.0–0.2)

## 2020-10-04 MED ORDER — METOPROLOL TARTRATE 25 MG PO TABS
25.0000 mg | ORAL_TABLET | Freq: Two times a day (BID) | ORAL | 3 refills | Status: DC
Start: 2020-10-04 — End: 2021-11-21
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
## 2020-10-13 MED FILL — TEMOZOLOMIDE 100 MG CAPS: 100 | 28 days supply | Qty: 20 | Fill #3

## 2020-10-13 MED FILL — CAPECITABINE 500 MG TABLET: 500 | 14 days supply | Qty: 84 | Fill #2

## 2020-10-26 ENCOUNTER — Ambulatory Visit (INDEPENDENT_AMBULATORY_CARE_PROVIDER_SITE_OTHER): Payer: Medicare Other | Admitting: Podiatry

## 2020-10-26 ENCOUNTER — Encounter: Payer: Self-pay | Admitting: Podiatry

## 2020-10-26 ENCOUNTER — Other Ambulatory Visit: Payer: Self-pay

## 2020-10-26 DIAGNOSIS — B351 Tinea unguium: Secondary | ICD-10-CM | POA: Diagnosis not present

## 2020-10-26 DIAGNOSIS — S99822A Other specified injuries of left foot, initial encounter: Secondary | ICD-10-CM

## 2020-10-26 DIAGNOSIS — M79675 Pain in left toe(s): Secondary | ICD-10-CM | POA: Diagnosis not present

## 2020-10-26 DIAGNOSIS — Z800 Family history of malignant neoplasm of digestive organs: Secondary | ICD-10-CM | POA: Insufficient documentation

## 2020-10-26 DIAGNOSIS — D126 Benign neoplasm of colon, unspecified: Secondary | ICD-10-CM | POA: Insufficient documentation

## 2020-10-26 DIAGNOSIS — F17200 Nicotine dependence, unspecified, uncomplicated: Secondary | ICD-10-CM | POA: Insufficient documentation

## 2020-10-26 DIAGNOSIS — M2041 Other hammer toe(s) (acquired), right foot: Secondary | ICD-10-CM

## 2020-10-26 DIAGNOSIS — E1151 Type 2 diabetes mellitus with diabetic peripheral angiopathy without gangrene: Secondary | ICD-10-CM | POA: Diagnosis not present

## 2020-10-26 DIAGNOSIS — M2042 Other hammer toe(s) (acquired), left foot: Secondary | ICD-10-CM

## 2020-10-26 DIAGNOSIS — M79674 Pain in right toe(s): Secondary | ICD-10-CM | POA: Diagnosis not present

## 2020-10-26 DIAGNOSIS — F1010 Alcohol abuse, uncomplicated: Secondary | ICD-10-CM | POA: Insufficient documentation

## 2020-10-26 DIAGNOSIS — G5600 Carpal tunnel syndrome, unspecified upper limb: Secondary | ICD-10-CM | POA: Insufficient documentation

## 2020-10-26 DIAGNOSIS — S5400XA Injury of ulnar nerve at forearm level, unspecified arm, initial encounter: Secondary | ICD-10-CM | POA: Insufficient documentation

## 2020-10-27 ENCOUNTER — Telehealth (INDEPENDENT_AMBULATORY_CARE_PROVIDER_SITE_OTHER): Payer: Medicare Other | Admitting: Podiatry

## 2020-10-27 ENCOUNTER — Telehealth: Payer: Self-pay | Admitting: *Deleted

## 2020-10-27 NOTE — Telephone Encounter (Signed)
Patient's wife is calling and would like to know the name of OTC foot  cream recommended at visit 1 day ago, did not receive an AVS. Please advise.

## 2020-10-27 NOTE — Telephone Encounter (Signed)
[name] called because [name] could not remember name of OTC medication to apply to his left great toe. Informed her to apply a small amount of betadine paint to the left great toe once daily until healed.

## 2020-10-27 NOTE — Telephone Encounter (Signed)
Patient called.

## 2020-10-29 ENCOUNTER — Other Ambulatory Visit: Payer: Self-pay | Admitting: Emergency Medicine

## 2020-10-31 NOTE — Progress Notes (Signed)
Subjective: Richard Navarro presents today for at risk foot care. Pt has h/o NIDDM with PAD and painful thick toenails that are difficult to trim. Pain interferes with ambulation. Aggravating factors include wearing enclosed shoe gear. Pain is relieved with periodic professional debridement.Marland Kitchen   He is undergoing chemotherapy for lung cancer.  [name], MD is patient's PCP. Last visit was 06/14/2020.   Allergies  Allergen Reactions  . Lisinopril Swelling    Angioedema- on this and afinitor same time  . Simvastatin Other (See Comments)    Joint ache   Social History   Occupational History  . Not on file  Tobacco Use  . Smoking status: Former Smoker    Packs/day: 1.50    Years: 46.00    Pack years: 69.00    Types: Cigarettes    Start date: 03/04/1965    Quit date: 10/19/2016    Years since quitting: 4.0  . Smokeless tobacco: Never Used  Vaping Use  . Vaping Use: Never used  Substance and Sexual Activity  . Alcohol use: Not Currently  . Drug use: No  . Sexual activity: Not on file   Review of Systems: Negative except as noted in the HPI.  Objective: There were no vitals filed for this visit.  Richard Navarro is a pleasant 75 y.o. male in NAD. AAO X 3.  Vascular Examination: Capillary fill time to digits <3 seconds b/l lower extremities. Faintly palpable DP pulse(s) b/l lower extremities. Nonpalpable PT pulse(s) b/l lower extremities. Pedal hair sparse. Lower extremity skin temperature gradient within normal limits.  Dermatological Examination: Pedal skin is thin shiny, atrophic b/l lower extremities. No open wounds bilaterally. No interdigital macerations bilaterally. Toenails 1-5 b/l elongated, discolored, dystrophic, thickened, crumbly with subungual debris and tenderness to dorsal palpation.   Evidence of subungual seroma distal tip of left hallux with a small amount of serous drainage expressed with debridement of nailplate. Remainder of nailplate remains  adhered. No erythema, no edema, no fluctuance.  Musculoskeletal Examination: Normal muscle strength 5/5 to all lower extremity muscle groups bilaterally. No pain crepitus or joint limitation noted with ROM b/l. Hammertoes noted to the 1-5 bilaterally. Patient ambulates independent of any assistive aids.  Neurological Examination: Protective sensation intact 5/5 intact bilaterally with 10g monofilament b/l. Vibratory sensation intact b/l.  Hemoglobin A1C Latest Ref Rng & Units 09/21/2020 06/14/2020 01/26/2020  HGBA1C 4.6 - 6.5 % 6.3 6.0(A) 6.4(H)  Some recent data might be hidden   Assessment: 1. Pain due to onychomycosis of toenails of both feet   2. Acquired hammertoes of both feet   3. Subungual injury of toe of left foot, initial encounter   4. Type II diabetes mellitus with peripheral circulatory disorder ([facility])     Plan: -Examined patient. -We discussed potential nail changes that can occur with chemotherapy. -Continue diabetic foot care principles. -Patient to continue soft, supportive shoe gear daily. -Toenails 1-5 b/l were debrided in length and girth with sterile nail nippers and dremel without iatrogenic bleeding. Left hallux seroma treated with betadine solution. Patient instructed to apply betadine solution once daily until resolved. -Patient to report any pedal injuries to medical professional immediately. -Patient/POA to call should there be question/concern in the interim.  Return in about 3 months (around 01/26/2021).  Marzetta Board, DPM

## 2020-11-02 ENCOUNTER — Inpatient Hospital Stay: Payer: Medicare Other

## 2020-11-02 ENCOUNTER — Inpatient Hospital Stay: Payer: Medicare Other | Attending: Internal Medicine | Admitting: Internal Medicine

## 2020-11-02 ENCOUNTER — Other Ambulatory Visit: Payer: Self-pay

## 2020-11-02 ENCOUNTER — Encounter: Payer: Self-pay | Admitting: Internal Medicine

## 2020-11-02 DIAGNOSIS — C7B02 Secondary carcinoid tumors of liver: Secondary | ICD-10-CM | POA: Insufficient documentation

## 2020-11-02 DIAGNOSIS — E119 Type 2 diabetes mellitus without complications: Secondary | ICD-10-CM | POA: Insufficient documentation

## 2020-11-02 DIAGNOSIS — Z7982 Long term (current) use of aspirin: Secondary | ICD-10-CM | POA: Diagnosis not present

## 2020-11-02 DIAGNOSIS — Z7984 Long term (current) use of oral hypoglycemic drugs: Secondary | ICD-10-CM | POA: Diagnosis not present

## 2020-11-02 DIAGNOSIS — Z79899 Other long term (current) drug therapy: Secondary | ICD-10-CM | POA: Diagnosis not present

## 2020-11-02 DIAGNOSIS — N189 Chronic kidney disease, unspecified: Secondary | ICD-10-CM | POA: Insufficient documentation

## 2020-11-02 DIAGNOSIS — C7A090 Malignant carcinoid tumor of the bronchus and lung: Secondary | ICD-10-CM | POA: Diagnosis not present

## 2020-11-02 DIAGNOSIS — I10 Essential (primary) hypertension: Secondary | ICD-10-CM | POA: Insufficient documentation

## 2020-11-02 DIAGNOSIS — C7A8 Other malignant neuroendocrine tumors: Secondary | ICD-10-CM

## 2020-11-02 DIAGNOSIS — C3490 Malignant neoplasm of unspecified part of unspecified bronchus or lung: Secondary | ICD-10-CM | POA: Diagnosis not present

## 2020-11-02 LAB — CBC WITH DIFFERENTIAL (CANCER CENTER ONLY)
Abs Immature Granulocytes: 0.03 10*3/uL (ref 0.00–0.07)
Basophils Absolute: 0 10*3/uL (ref 0.0–0.1)
Basophils Relative: 0 %
Eosinophils Absolute: 0 10*3/uL (ref 0.0–0.5)
Eosinophils Relative: 1 %
HCT: 31.2 % — ABNORMAL LOW (ref 39.0–52.0)
Hemoglobin: 10.7 g/dL — ABNORMAL LOW (ref 13.0–17.0)
Immature Granulocytes: 1 %
Lymphocytes Relative: 14 %
Lymphs Abs: 0.7 10*3/uL (ref 0.7–4.0)
MCH: 32.6 pg (ref 26.0–34.0)
MCHC: 34.3 g/dL (ref 30.0–36.0)
MCV: 95.1 fL (ref 80.0–100.0)
Monocytes Absolute: 0.5 10*3/uL (ref 0.1–1.0)
Monocytes Relative: 9 %
Neutro Abs: 3.8 10*3/uL (ref 1.7–7.7)
Neutrophils Relative %: 75 %
Platelet Count: 180 10*3/uL (ref 150–400)
RBC: 3.28 MIL/uL — ABNORMAL LOW (ref 4.22–5.81)
RDW: 14 % (ref 11.5–15.5)
WBC Count: 5 10*3/uL (ref 4.0–10.5)
nRBC: 0 % (ref 0.0–0.2)

## 2020-11-02 LAB — CMP (CANCER CENTER ONLY)
ALT: 15 U/L (ref 0–44)
AST: 15 U/L (ref 15–41)
Albumin: 3.6 g/dL (ref 3.5–5.0)
Alkaline Phosphatase: 75 U/L (ref 38–126)
Anion gap: 8 (ref 5–15)
BUN: 19 mg/dL (ref 8–23)
CO2: 28 mmol/L (ref 22–32)
Calcium: 9.4 mg/dL (ref 8.9–10.3)
Chloride: 102 mmol/L (ref 98–111)
Creatinine: 1.69 mg/dL — ABNORMAL HIGH (ref 0.61–1.24)
GFR, Estimated: 42 mL/min — ABNORMAL LOW (ref >60)
Glucose, Bld: 156 mg/dL — ABNORMAL HIGH (ref 70–99)
Potassium: 3.9 mmol/L (ref 3.5–5.1)
Sodium: 138 mmol/L (ref 135–145)
Total Bilirubin: 0.4 mg/dL (ref 0.3–1.2)
Total Protein: 7.6 g/dL (ref 6.5–8.1)
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
## 2020-11-04 ENCOUNTER — Ambulatory Visit (INDEPENDENT_AMBULATORY_CARE_PROVIDER_SITE_OTHER): Payer: Medicare Other

## 2020-11-04 ENCOUNTER — Other Ambulatory Visit: Payer: Self-pay | Admitting: Internal Medicine

## 2020-11-04 DIAGNOSIS — Z0000 Encounter for general adult medical examination without abnormal findings: Secondary | ICD-10-CM | POA: Diagnosis not present

## 2020-11-04 DIAGNOSIS — C7A8 Other malignant neuroendocrine tumors: Secondary | ICD-10-CM
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
## 2020-11-04 NOTE — Patient Instructions (Addendum)
Richard Navarro , Thank you for taking time to come for your Medicare Wellness Visit. I appreciate your ongoing commitment to your health goals. Please review the following plan we discussed and let me know if I can assist you in the future.   Screening recommendations/referrals: Colonoscopy: no longer required Recommended yearly ophthalmology/optometry visit for glaucoma screening and checkup Recommended yearly dental visit for hygiene and checkup  Vaccinations: Influenza vaccine: Up to date Pneumococcal vaccine: Up to date Tdap vaccine: Up to date Shingles vaccine: Shingrix discussed. Please contact your pharmacy for coverage information.    Covid-19: Completed 2/5, 2/26, & 04/17/20  Advanced directives: Advance directive discussed with you today. Even though you declined this today please call our office should you change your mind and we can give you the proper paperwork for you to fill out.  Conditions/risks identified: Stay Alive   Next appointment: Follow up in one year for your annual wellness visit.   Preventive Care 30 Years and Older, Male Preventive care refers to lifestyle choices and visits with your health care provider that can promote health and wellness. What does preventive care include?  A yearly physical exam. This is also called an annual well check.  Dental exams once or twice a year.  Routine eye exams. Ask your health care provider how often you should have your eyes checked.  Personal lifestyle choices, including:  Daily care of your teeth and gums.  Regular physical activity.  Eating a healthy diet.  Avoiding tobacco and drug use.  Limiting alcohol use.  Practicing safe sex.  Taking low doses of aspirin every day.  Taking vitamin and mineral supplements as recommended by your health care provider. What happens during an annual well check? The services and screenings done by your health care provider during your annual well check will depend on  your age, overall health, lifestyle risk factors, and family history of disease. Counseling  Your health care provider may ask you questions about your:  Alcohol use.  Tobacco use.  Drug use.  Emotional well-being.  Home and relationship well-being.  Sexual activity.  Eating habits.  History of falls.  Memory and ability to understand (cognition).  Work and work Statistician. Screening  You may have the following tests or measurements:  Height, weight, and BMI.  Blood pressure.  Lipid and cholesterol levels. These may be checked every 5 years, or more frequently if you are over 39 years old.  Skin check.  Lung cancer screening. You may have this screening every year starting at age 35 if you have a 30-pack-year history of smoking and currently smoke or have quit within the past 15 years.  Fecal occult blood test (FOBT) of the stool. You may have this test every year starting at age 56.  Flexible sigmoidoscopy or colonoscopy. You may have a sigmoidoscopy every 5 years or a colonoscopy every 10 years starting at age 55.  Prostate cancer screening. Recommendations will vary depending on your family history and other risks.  Hepatitis C blood test.  Hepatitis B blood test.  Sexually transmitted disease (STD) testing.  Diabetes screening. This is done by checking your blood sugar (glucose) after you have not eaten for a while (fasting). You may have this done every 1-3 years.  Abdominal aortic aneurysm (AAA) screening. You may need this if you are a current or former smoker.  Osteoporosis. You may be screened starting at age 40 if you are at high risk. Talk with your health care provider about your  test results, treatment options, and if necessary, the need for more tests. Vaccines  Your health care provider may recommend certain vaccines, such as:  Influenza vaccine. This is recommended every year.  Tetanus, diphtheria, and acellular pertussis (Tdap, Td) vaccine.  You may need a Td booster every 10 years.  Zoster vaccine. You may need this after age 78.  Pneumococcal 13-valent conjugate (PCV13) vaccine. One dose is recommended after age 18.  Pneumococcal polysaccharide (PPSV23) vaccine. One dose is recommended after age 73. Talk to your health care provider about which screenings and vaccines you need and how often you need them. This information is not intended to replace advice given to you by your health care provider. Make sure you discuss any questions you have with your health care provider. Document Released: 09/03/2015 Document Revised: 04/26/2016 Document Reviewed: 06/08/2015 Elsevier Interactive Patient Education  2017 [place] Prevention in the Home Falls can cause injuries. They can happen to people of all ages. There are many things you can do to make your home safe and to help prevent falls. What can I do on the outside of my home?  Regularly fix the edges of walkways and driveways and fix any cracks.  Remove anything that might make you trip as you walk through a door, such as a raised step or threshold.  Trim any bushes or trees on the path to your home.  Use bright outdoor lighting.  Clear any walking paths of anything that might make someone trip, such as rocks or tools.  Regularly check to see if handrails are loose or broken. Make sure that both sides of any steps have handrails.  Any raised decks and porches should have guardrails on the edges.  Have any leaves, snow, or ice cleared regularly.  Use sand or salt on walking paths during winter.  Clean up any spills in your garage right away. This includes oil or grease spills. What can I do in the bathroom?  Use night lights.  Install grab bars by the toilet and in the tub and shower. Do not use towel bars as grab bars.  Use non-skid mats or decals in the tub or shower.  If you need to sit down in the shower, use a plastic, non-slip stool.  Keep the  floor dry. Clean up any water that spills on the floor as soon as it happens.  Remove soap buildup in the tub or shower regularly.  Attach bath mats securely with double-sided non-slip rug tape.  Do not have throw rugs and other things on the floor that can make you trip. What can I do in the bedroom?  Use night lights.  Make sure that you have a light by your bed that is easy to reach.  Do not use any sheets or blankets that are too big for your bed. They should not hang down onto the floor.  Have a firm chair that has side arms. You can use this for support while you get dressed.  Do not have throw rugs and other things on the floor that can make you trip. What can I do in the kitchen?  Clean up any spills right away.  Avoid walking on wet floors.  Keep items that you use a lot in easy-to-reach places.  If you need to reach something above you, use a strong step stool that has a grab bar.  Keep electrical cords out of the way.  Do not use floor polish or wax that  makes floors slippery. If you must use wax, use non-skid floor wax.  Do not have throw rugs and other things on the floor that can make you trip. What can I do with my stairs?  Do not leave any items on the stairs.  Make sure that there are handrails on both sides of the stairs and use them. Fix handrails that are broken or loose. Make sure that handrails are as long as the stairways.  Check any carpeting to make sure that it is firmly attached to the stairs. Fix any carpet that is loose or worn.  Avoid having throw rugs at the top or bottom of the stairs. If you do have throw rugs, attach them to the floor with carpet tape.  Make sure that you have a light switch at the top of the stairs and the bottom of the stairs. If you do not have them, ask someone to add them for you. What else can I do to help prevent falls?  Wear shoes that:  Do not have high heels.  Have rubber bottoms.  Are comfortable and fit  you well.  Are closed at the toe. Do not wear sandals.  If you use a stepladder:  Make sure that it is fully opened. Do not climb a closed stepladder.  Make sure that both sides of the stepladder are locked into place.  Ask someone to hold it for you, if possible.  Clearly mark and make sure that you can see:  Any grab bars or handrails.  First and last steps.  Where the edge of each step is.  Use tools that help you move around (mobility aids) if they are needed. These include:  Canes.  Walkers.  Scooters.  Crutches.  Turn on the lights when you go into a dark area. Replace any light bulbs as soon as they burn out.  Set up your furniture so you have a clear path. Avoid moving your furniture around.  If any of your floors are uneven, fix them.  If there are any pets around you, be aware of where they are.  Review your medicines with your doctor. Some medicines can make you feel dizzy. This can increase your chance of falling. Ask your doctor what other things that you can do to help prevent falls. This information is not intended to replace advice given to you by your health care provider. Make sure you discuss any questions you have with your health care provider. Document Released: 06/03/2009 Document Revised: 01/13/2016 Document Reviewed: 09/11/2014 Elsevier Interactive Patient Education  2017 [employer].

## 2020-11-05 ENCOUNTER — Telehealth: Payer: Self-pay | Admitting: Internal Medicine

## 2020-11-05 NOTE — Telephone Encounter (Signed)
Scheduled per los. Called and spoke with patient. Confirmed appt 

## 2020-11-19 ENCOUNTER — Other Ambulatory Visit (HOSPITAL_COMMUNITY): Payer: Self-pay

## 2020-11-30 ENCOUNTER — Other Ambulatory Visit (HOSPITAL_COMMUNITY): Payer: Self-pay

## 2020-12-03 ENCOUNTER — Ambulatory Visit (HOSPITAL_COMMUNITY): Payer: Medicare Other

## 2020-12-03 ENCOUNTER — Inpatient Hospital Stay: Payer: Medicare Other | Attending: Internal Medicine

## 2020-12-03 ENCOUNTER — Ambulatory Visit (HOSPITAL_COMMUNITY)
Admission: RE | Admit: 2020-12-03 | Discharge: 2020-12-03 | Disposition: A | Discharge status: Home / Self Care | Payer: Medicare Other | Source: Ambulatory Visit | Attending: Internal Medicine | Admitting: Internal Medicine

## 2020-12-03 ENCOUNTER — Encounter (HOSPITAL_COMMUNITY): Payer: Self-pay

## 2020-12-03 ENCOUNTER — Other Ambulatory Visit: Payer: Self-pay

## 2020-12-03 DIAGNOSIS — E1122 Type 2 diabetes mellitus with diabetic chronic kidney disease: Secondary | ICD-10-CM | POA: Insufficient documentation

## 2020-12-03 DIAGNOSIS — M109 Gout, unspecified: Secondary | ICD-10-CM | POA: Insufficient documentation

## 2020-12-03 DIAGNOSIS — M47812 Spondylosis without myelopathy or radiculopathy, cervical region: Secondary | ICD-10-CM | POA: Insufficient documentation

## 2020-12-03 DIAGNOSIS — C3490 Malignant neoplasm of unspecified part of unspecified bronchus or lung: Secondary | ICD-10-CM | POA: Insufficient documentation

## 2020-12-03 DIAGNOSIS — K5909 Other constipation: Secondary | ICD-10-CM | POA: Insufficient documentation

## 2020-12-03 DIAGNOSIS — R55 Syncope and collapse: Secondary | ICD-10-CM | POA: Insufficient documentation

## 2020-12-03 DIAGNOSIS — R5383 Other fatigue: Secondary | ICD-10-CM | POA: Diagnosis not present

## 2020-12-03 DIAGNOSIS — J90 Pleural effusion, not elsewhere classified: Secondary | ICD-10-CM | POA: Insufficient documentation

## 2020-12-03 DIAGNOSIS — M47816 Spondylosis without myelopathy or radiculopathy, lumbar region: Secondary | ICD-10-CM | POA: Insufficient documentation

## 2020-12-03 DIAGNOSIS — I2510 Atherosclerotic heart disease of native coronary artery without angina pectoris: Secondary | ICD-10-CM | POA: Insufficient documentation

## 2020-12-03 DIAGNOSIS — I129 Hypertensive chronic kidney disease with stage 1 through stage 4 chronic kidney disease, or unspecified chronic kidney disease: Secondary | ICD-10-CM | POA: Insufficient documentation

## 2020-12-03 DIAGNOSIS — C7A8 Other malignant neuroendocrine tumors: Secondary | ICD-10-CM | POA: Insufficient documentation

## 2020-12-03 DIAGNOSIS — C7B8 Other secondary neuroendocrine tumors: Secondary | ICD-10-CM | POA: Insufficient documentation

## 2020-12-03 DIAGNOSIS — K449 Diaphragmatic hernia without obstruction or gangrene: Secondary | ICD-10-CM | POA: Diagnosis not present

## 2020-12-03 DIAGNOSIS — I700 Atherosclerosis of aorta: Secondary | ICD-10-CM | POA: Insufficient documentation

## 2020-12-03 DIAGNOSIS — N1832 Chronic kidney disease, stage 3b: Secondary | ICD-10-CM | POA: Insufficient documentation

## 2020-12-03 DIAGNOSIS — N200 Calculus of kidney: Secondary | ICD-10-CM | POA: Insufficient documentation

## 2020-12-03 DIAGNOSIS — Z888 Allergy status to other drugs, medicaments and biological substances status: Secondary | ICD-10-CM | POA: Insufficient documentation

## 2020-12-03 DIAGNOSIS — Z5111 Encounter for antineoplastic chemotherapy: Secondary | ICD-10-CM | POA: Diagnosis not present

## 2020-12-03 DIAGNOSIS — Z86012 Personal history of benign carcinoid tumor: Secondary | ICD-10-CM | POA: Diagnosis not present

## 2020-12-03 DIAGNOSIS — J432 Centrilobular emphysema: Secondary | ICD-10-CM | POA: Insufficient documentation

## 2020-12-03 DIAGNOSIS — Z79899 Other long term (current) drug therapy: Secondary | ICD-10-CM | POA: Insufficient documentation

## 2020-12-03 DIAGNOSIS — Z9221 Personal history of antineoplastic chemotherapy: Secondary | ICD-10-CM | POA: Insufficient documentation

## 2020-12-03 LAB — CBC WITH DIFFERENTIAL (CANCER CENTER ONLY)
Abs Immature Granulocytes: 0.05 10*3/uL (ref 0.00–0.07)
Basophils Absolute: 0 10*3/uL (ref 0.0–0.1)
Basophils Relative: 1 %
Eosinophils Absolute: 0 10*3/uL (ref 0.0–0.5)
Eosinophils Relative: 1 %
HCT: 33.6 % — ABNORMAL LOW (ref 39.0–52.0)
Hemoglobin: 11.5 g/dL — ABNORMAL LOW (ref 13.0–17.0)
Immature Granulocytes: 1 %
Lymphocytes Relative: 19 %
Lymphs Abs: 0.7 10*3/uL (ref 0.7–4.0)
MCH: 33 pg (ref 26.0–34.0)
MCHC: 34.2 g/dL (ref 30.0–36.0)
MCV: 96.6 fL (ref 80.0–100.0)
Monocytes Absolute: 0.5 10*3/uL (ref 0.1–1.0)
Monocytes Relative: 12 %
Neutro Abs: 2.6 10*3/uL (ref 1.7–7.7)
Neutrophils Relative %: 66 %
Platelet Count: 192 10*3/uL (ref 150–400)
RBC: 3.48 MIL/uL — ABNORMAL LOW (ref 4.22–5.81)
RDW: 15.5 % (ref 11.5–15.5)
WBC Count: 4 10*3/uL (ref 4.0–10.5)
nRBC: 0 % (ref 0.0–0.2)

## 2020-12-03 LAB — CMP (CANCER CENTER ONLY)
ALT: 13 U/L (ref 0–44)
AST: 15 U/L (ref 15–41)
Albumin: 3.8 g/dL (ref 3.5–5.0)
Alkaline Phosphatase: 77 U/L (ref 38–126)
Anion gap: 10 (ref 5–15)
BUN: 24 mg/dL — ABNORMAL HIGH (ref 8–23)
CO2: 26 mmol/L (ref 22–32)
Calcium: 9.2 mg/dL (ref 8.9–10.3)
Chloride: 104 mmol/L (ref 98–111)
Creatinine: 1.84 mg/dL — ABNORMAL HIGH (ref 0.61–1.24)
GFR, Estimated: 38 mL/min — ABNORMAL LOW (ref >60)
Glucose, Bld: 141 mg/dL — ABNORMAL HIGH (ref 70–99)
Potassium: 4.1 mmol/L (ref 3.5–5.1)
Sodium: 140 mmol/L (ref 135–145)
Total Bilirubin: 0.3 mg/dL (ref 0.3–1.2)
Total Protein: 7.8 g/dL (ref 6.5–8.1)

## 2020-12-06 ENCOUNTER — Other Ambulatory Visit: Payer: Self-pay

## 2020-12-06 ENCOUNTER — Telehealth: Payer: Self-pay | Admitting: Internal Medicine

## 2020-12-06 ENCOUNTER — Inpatient Hospital Stay (HOSPITAL_BASED_OUTPATIENT_CLINIC_OR_DEPARTMENT_OTHER): Payer: Medicare Other | Admitting: Internal Medicine

## 2020-12-06 ENCOUNTER — Other Ambulatory Visit (HOSPITAL_COMMUNITY): Payer: Self-pay

## 2020-12-06 VITALS — BP 138/68 | HR 72 | Temp 97.9°F | Resp 18 | Ht 75.0 in | Wt 182.4 lb

## 2020-12-06 DIAGNOSIS — K5909 Other constipation: Secondary | ICD-10-CM | POA: Diagnosis not present

## 2020-12-06 DIAGNOSIS — N1832 Chronic kidney disease, stage 3b: Secondary | ICD-10-CM | POA: Diagnosis not present

## 2020-12-06 DIAGNOSIS — J432 Centrilobular emphysema: Secondary | ICD-10-CM | POA: Diagnosis not present

## 2020-12-06 DIAGNOSIS — K449 Diaphragmatic hernia without obstruction or gangrene: Secondary | ICD-10-CM | POA: Diagnosis not present

## 2020-12-06 DIAGNOSIS — Z5111 Encounter for antineoplastic chemotherapy: Secondary | ICD-10-CM | POA: Diagnosis not present

## 2020-12-06 DIAGNOSIS — N200 Calculus of kidney: Secondary | ICD-10-CM | POA: Diagnosis not present

## 2020-12-06 DIAGNOSIS — I129 Hypertensive chronic kidney disease with stage 1 through stage 4 chronic kidney disease, or unspecified chronic kidney disease: Secondary | ICD-10-CM | POA: Diagnosis not present

## 2020-12-06 DIAGNOSIS — R55 Syncope and collapse: Secondary | ICD-10-CM | POA: Diagnosis not present

## 2020-12-06 DIAGNOSIS — Z9221 Personal history of antineoplastic chemotherapy: Secondary | ICD-10-CM | POA: Diagnosis not present

## 2020-12-06 DIAGNOSIS — C7B8 Other secondary neuroendocrine tumors: Secondary | ICD-10-CM | POA: Diagnosis not present

## 2020-12-06 DIAGNOSIS — I2510 Atherosclerotic heart disease of native coronary artery without angina pectoris: Secondary | ICD-10-CM | POA: Diagnosis not present

## 2020-12-06 DIAGNOSIS — C7A8 Other malignant neuroendocrine tumors: Secondary | ICD-10-CM | POA: Diagnosis not present

## 2020-12-06 DIAGNOSIS — Z79899 Other long term (current) drug therapy: Secondary | ICD-10-CM | POA: Diagnosis not present

## 2020-12-06 DIAGNOSIS — C3491 Malignant neoplasm of unspecified part of right bronchus or lung: Secondary | ICD-10-CM

## 2020-12-06 DIAGNOSIS — M109 Gout, unspecified: Secondary | ICD-10-CM | POA: Diagnosis not present

## 2020-12-06 DIAGNOSIS — C3431 Malignant neoplasm of lower lobe, right bronchus or lung: Secondary | ICD-10-CM | POA: Diagnosis not present

## 2020-12-06 DIAGNOSIS — M47812 Spondylosis without myelopathy or radiculopathy, cervical region: Secondary | ICD-10-CM | POA: Diagnosis not present

## 2020-12-06 DIAGNOSIS — I700 Atherosclerosis of aorta: Secondary | ICD-10-CM | POA: Diagnosis not present

## 2020-12-06 DIAGNOSIS — M47816 Spondylosis without myelopathy or radiculopathy, lumbar region: Secondary | ICD-10-CM | POA: Diagnosis not present

## 2020-12-06 DIAGNOSIS — Z888 Allergy status to other drugs, medicaments and biological substances status: Secondary | ICD-10-CM | POA: Diagnosis not present

## 2020-12-06 DIAGNOSIS — J90 Pleural effusion, not elsewhere classified: Secondary | ICD-10-CM | POA: Diagnosis not present

## 2020-12-06 DIAGNOSIS — E1122 Type 2 diabetes mellitus with diabetic chronic kidney disease: Secondary | ICD-10-CM | POA: Diagnosis not present

## 2020-12-06 MED FILL — Temozolomide Cap 100 MG: ORAL | 21 days supply | Qty: 20 | Fill #0 | Status: CN

## 2020-12-06 MED FILL — Capecitabine Tab 500 MG: ORAL | 14 days supply | Qty: 84 | Fill #0 | Status: AC

## 2020-12-06 NOTE — Telephone Encounter (Signed)
Scheduled follow-up appointment per 4/18 los. Patient is aware.
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
## 2020-12-07 ENCOUNTER — Other Ambulatory Visit: Payer: Self-pay | Admitting: Family Medicine

## 2020-12-07 ENCOUNTER — Other Ambulatory Visit (HOSPITAL_COMMUNITY): Payer: Self-pay

## 2020-12-07 ENCOUNTER — Telehealth: Payer: Self-pay | Admitting: Emergency Medicine

## 2020-12-07 MED ORDER — ALBUTEROL SULFATE HFA 108 (90 BASE) MCG/ACT IN AERS: 2.0000 | INHALATION_SPRAY | RESPIRATORY_TRACT | 2 refills | Status: AC | PRN

## 2020-12-07 MED FILL — Temozolomide Cap 100 MG: ORAL | 28 days supply | Qty: 20 | Fill #0 | Status: CN

## 2020-12-07 NOTE — Telephone Encounter (Signed)
I have sent in the refill that was requested for the inhaler to the mail order pharmacy.  I have called and LM on VM for barbara to make her aware.  Nothing further is needed.

## 2020-12-08 ENCOUNTER — Other Ambulatory Visit (HOSPITAL_COMMUNITY): Payer: Self-pay

## 2020-12-08 MED FILL — Temozolomide Cap 100 MG: ORAL | 28 days supply | Qty: 20 | Fill #0 | Status: AC

## 2020-12-08 MED FILL — Temozolomide Cap 100 MG: ORAL | 28 days supply | Qty: 20 | Fill #0 | Status: CN

## 2020-12-15 ENCOUNTER — Other Ambulatory Visit: Payer: Self-pay

## 2020-12-21 ENCOUNTER — Other Ambulatory Visit: Payer: Self-pay | Admitting: Family Medicine

## 2020-12-23 ENCOUNTER — Other Ambulatory Visit (HOSPITAL_COMMUNITY): Payer: Self-pay

## 2020-12-24 ENCOUNTER — Other Ambulatory Visit (HOSPITAL_COMMUNITY): Payer: Self-pay

## 2020-12-24 MED FILL — Capecitabine Tab 500 MG: ORAL | 14 days supply | Qty: 84 | Fill #1 | Status: AC

## 2020-12-24 MED FILL — Temozolomide Cap 100 MG: ORAL | 28 days supply | Qty: 20 | Fill #1 | Status: AC

## 2020-12-28 ENCOUNTER — Encounter: Payer: Self-pay | Admitting: Family Medicine

## 2020-12-28 ENCOUNTER — Other Ambulatory Visit (HOSPITAL_COMMUNITY): Payer: Self-pay

## 2020-12-28 ENCOUNTER — Ambulatory Visit (INDEPENDENT_AMBULATORY_CARE_PROVIDER_SITE_OTHER): Payer: Medicare Other | Admitting: Family Medicine

## 2020-12-28 ENCOUNTER — Other Ambulatory Visit: Payer: Self-pay

## 2020-12-28 VITALS — BP 136/76 | HR 73 | Temp 98.1°F | Ht 75.0 in | Wt 184.4 lb

## 2020-12-28 DIAGNOSIS — M25532 Pain in left wrist: Secondary | ICD-10-CM | POA: Diagnosis not present

## 2020-12-28 DIAGNOSIS — I152 Hypertension secondary to endocrine disorders: Secondary | ICD-10-CM | POA: Diagnosis not present

## 2020-12-28 DIAGNOSIS — G8929 Other chronic pain: Secondary | ICD-10-CM | POA: Diagnosis not present

## 2020-12-28 DIAGNOSIS — R6881 Early satiety: Secondary | ICD-10-CM | POA: Diagnosis not present

## 2020-12-28 DIAGNOSIS — E1159 Type 2 diabetes mellitus with other circulatory complications: Secondary | ICD-10-CM

## 2020-12-28 DIAGNOSIS — M25511 Pain in right shoulder: Secondary | ICD-10-CM

## 2020-12-28 DIAGNOSIS — N1832 Chronic kidney disease, stage 3b: Secondary | ICD-10-CM | POA: Diagnosis not present

## 2020-12-28 DIAGNOSIS — E1122 Type 2 diabetes mellitus with diabetic chronic kidney disease: Secondary | ICD-10-CM | POA: Diagnosis not present

## 2020-12-28 DIAGNOSIS — R1013 Epigastric pain: Secondary | ICD-10-CM | POA: Diagnosis not present

## 2020-12-28 LAB — HEMOGLOBIN A1C: Hgb A1c MFr Bld: 7.1 % — ABNORMAL HIGH (ref 4.6–6.5)

## 2020-12-28 NOTE — Progress Notes (Signed)
Phone [phone] In person visit   Subjective:   Richard Navarro is a 75 y.o. year old very pleasant male patient who presents for/with See problem oriented charting Chief Complaint  Patient presents with  . Depression  . COPD  . Hypertension  . Diabetes  . Hyperlipidemia  . Chronic Kidney Disease    Stage 3    . Gout  . Thrombocytopenia    This visit occurred during the SARS-CoV-2 public health emergency.  Safety protocols were in place, including screening questions prior to the visit, additional usage of staff PPE, and extensive cleaning of exam room while observing appropriate contact time as indicated for disinfecting solutions.   Past Medical History-  Patient Active Problem List   Diagnosis Date Noted  . Anemia 05/07/2018    Priority: High  . Chronic kidney disease (CKD) stage G3b/A1, moderately decreased glomerular filtration rate (GFR) between 30-44 mL/min/1.73 square meter and albuminuria creatinine ratio less than 30 mg/g (HCC) 03/04/2018    Priority: High  . 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 Primary cancer of right lung ([place]) 12/22/2016    Priority: High  . Diabetes mellitus with renal manifestation (HCC)     Priority: High  . Myalgia due to statin 03/04/2018    Priority: Medium  . COPD (chronic obstructive pulmonary disease) ([place]) 12/01/2016    Priority: Medium  . PTSD (post-traumatic stress disorder) 12/07/2015    Priority: Medium  . Erectile dysfunction 04/09/2014    Priority: Medium  . Former smoker 03/26/2014    Priority: Medium  . Hypertension associated with diabetes ([place])     Priority: Medium  . Gout     Priority: Medium  . Depression     Priority: Medium  . Hyperlipidemia     Priority: Medium  . Thrombocytopenia ([place])     Priority: Low  . Pneumonia 08/09/2017    Priority: Low  . Encounter for antineoplastic chemotherapy 01/10/2017    Priority: Low  . Goals of care, counseling/discussion 01/10/2017     Priority: Low  . Prostate cancer screening 04/17/2014    Priority: Low  . Arthritis 03/26/2014    Priority: Low  . History of adenomatous polyp of colon 03/26/2014    Priority: Low  . GERD (gastroesophageal reflux disease)     Priority: Low  . Alcohol abuse 10/26/2020  . Benign neoplasm of colon 10/26/2020  . Carpal tunnel syndrome 10/26/2020  . Family history of malignant neoplasm of gastrointestinal tract 10/26/2020  . 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 Tobacco use disorder 10/26/2020  . Type II diabetes mellitus with peripheral circulatory disorder ([place]) 09/21/2020  . 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 Sepsis due to pneumonia ([place]) 01/26/2020  . Recurrent major depressive disorder, in full remission ([place]) 10/21/2019    Medications- reviewed and updated Current Outpatient Medications  Medication Sig Dispense Refill  . albuterol (VENTOLIN HFA) 108 (90 Base) MCG/ACT inhaler Inhale 2 puffs into the lungs every 4 (four) hours as needed for wheezing or shortness of breath. 3 each 2  . allopurinol (ZYLOPRIM) 100 MG tablet TAKE 1 TABLET DAILY 90 tablet 3  . amLODipine (NORVASC) 10 MG tablet TAKE 1 TABLET DAILY 90 tablet 3  . calcium carbonate (OS-CAL) 1250 (500 Ca) MG chewable tablet Chew 2 tablets by mouth 2 (two) times daily.    . capecitabine (XELODA) 500 MG tablet TAKE 3 TABLETS BY MOUTH TWICE DAILY FOR 14 DAYS  EVERY 4 WEEKS. 84 tablet 2  . Cholecalciferol (VITAMIN D3) 25 MCG (1000 UT) CAPS Take 1 capsule by mouth daily.    . Continuous Blood Gluc Sensor (FREESTYLE LIBRE 14 DAY SENSOR) MISC USE TO CHECK BLOOD SUGAR AS DIRECTED AND CHANGE EVERY 14 DAYS 2 each 5  . Ferrous Sulfate (IRON) 325 (65 Fe) MG TABS Take 1 tablet by mouth every other day.     Marland Kitchen glucose blood test strip Use to test blood sugar twice a day 200 each 3  . guaiFENesin (MUCINEX) 600 MG 12 hr tablet Take 600 mg by mouth 2 (two) times daily.     Marland Kitchen ketotifen (ZADITOR) 0.025 %  ophthalmic solution Place 1 drop into both eyes daily as needed (allergies).    . metoprolol tartrate (LOPRESSOR) 25 MG tablet Take 1 tablet (25 mg total) by mouth 2 (two) times daily. 180 tablet 3  . ondansetron (ZOFRAN) 8 MG tablet TAKE 1 TABLET BY MOUTH EVERY 8 HOURS AS NEEDED FOR NAUSEA AND/OR VOMITING 30 tablet 0  . pantoprazole (PROTONIX) 40 MG tablet TAKE 1 TABLET DAILY 90 tablet 3  . polyethylene glycol (MIRALAX / GLYCOLAX) 17 g packet Take 17 g by mouth daily.    Marland Kitchen temozolomide (TEMODAR) 100 MG capsule TAKE 4 CAPS BY MOUTH DAILY DAYS 10,11,12,13 AND 14 EVERY 4 WEEKS. TAKE ON AN EMPTY STOMACH OR AT BEDTIME TO DECREASE NAUSEA & VOMITING 20 capsule 3  . Tiotropium Bromide Monohydrate (SPIRIVA RESPIMAT) 2.5 MCG/ACT AERS Take 2 puffs by mouth daily. 12 g 3  . vitamin B-12 (CYANOCOBALAMIN) 1000 MCG tablet Take 1,000 mcg by mouth daily.     . vitamin C (ASCORBIC ACID) 500 MG tablet Take 500 mg by mouth daily.    Marland Kitchen amoxicillin-clavulanate (AUGMENTIN) 875-125 MG tablet  (Patient not taking: Reported on 11/04/2020)    . glipiZIDE (GLUCOTROL) 5 MG tablet TAKE 1 TABLET DAILY BEFORE BREAKFAST (Patient not taking: Reported on 12/28/2020) 90 tablet 3  . Hydrocortisone Micronized POWD SWISH AND SWALLOW 1 TEASPOONFUL EVERY 6 HOURS AS NEEDED. (Patient not taking: No sig reported) 100 g 0  . magic mouthwash w/lidocaine SOLN Take 5 mLs by mouth every 6 (six) hours as needed for mouth pain. (Patient not taking: Reported on 12/28/2020)    . sucralfate (CARAFATE) 1 g tablet TAKE 1 TABLET BY MOUTH FOUR TIMES DAILY WITH MEALS AND AT BEDTIME (Patient not taking: Reported on 12/28/2020) 30 tablet 0   No current facility-administered medications for this visit.     Objective:  BP 136/76   Pulse 73   Temp 98.1 F (36.7 C) (Temporal)   Ht 6\' 3"  (1.905 m)   Wt 184 lb 6.4 oz (83.6 kg)   SpO2 97%   BMI 23.05 kg/m  Gen: NAD, resting comfortably CV: RRR no murmurs rubs or gallops Lungs: CTAB no crackles, wheeze,  rhonchi Abdomen: soft/nontender/nondistended/normal bowel sounds. No rebound or guarding.  Ext: no edema Did not examine shoulder/wrist    Assessment and Plan    # Lung cancer-with neuroendocrine carcinoma-continues close follow-up with oncology- most recent scan.  Largely stable  #Chronic kidney disease stage III S: Patient with significant worsening in kidney function dating back to November 2020 when patient was on Afinitor previously- later went back on this but did not worsen as much (did raise CBGs though and determined not effective so switched). Follows with [facility] A/P: Creatinine has ranged from 1.7-2.0 for the most part recently with GFR is in the 30s or 40s.  Stable on labs within a month-continue to monitor and avoid NSAIDs.  Continue [facility] follow-up  #Diabetes mellitus S: No Rx as of 06/23/2019 then later restarted glipizde 10mg  BID then down to 10mg  AM and now 5 mg in AM Prior to reduce GFR-patient had been on  metformin 5 mg twice a day and Amaryl 2 mg daily.  A/P: Hopefully stable-update A1c with labs today  #Unstable Gait S:Wife reports that she notes patient is off balance, especially when going from sitting to standing to walking. He often has to lean on objects to stay steady with position change. Sometimes has some imbalance with walking- patient has been much less active since cancer treatments A/P:    # Bilateral shoulder pain(R>L) S:He reports pain in both of his shoulder for years.  A/P: Referred to sports medicine to m further evaluate  #Left wrist pain  S:He has pain in his left wrist, and has trouble with his strength-has been told by the [place] has a severe case of carpal tunnel.  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 Georgina Snell for  his opinion-if surgery is needed patient would prefer surgeon outside of the [place].  I do think patient could potentially tolerate steroid injection if needed based off last A1c-we will update another A1c today  # Epigastric Abdominal Pain S: Patient with history after radiation of feeling like foods were getting stuck in some of her throat discomfort-compliant with pantoprazole 40 mg daily.  No longer having trouble swallowing but now noting some epigastric abdominal pain and early satiety.  Did recently have a CT scan of abdomen pelvis without any clear obvious cause for the symptoms. He used to be able to eat without any complications however now he feels like his abdomen is "locking up and burning" after eating at times. He has slight weight gain, loss his appetite and bloating. He has taken Tums and OTC gas medications but no relief. No vomiting, nausea, or diarrhea, or blood in stool A/P: Patient with at least several months of epigastric abdominal pain and early satiety despite ongoing pantoprazole 40 mg use- CT scan was reassuring with oncology- I think we need further evaluation with GI evaluation-possible endoscopy   #hypertension S: medication: Taking amlodipine 10 mg and 25 mg Metoprolol Tartrate twice daily Home readings #s: Does not check regularly-when he has checked lower 140s is the upper end of readings-often gets in the 130s BP Readings from Last 3 Encounters:  12/28/20 136/76  12/06/20 138/68  11/02/20 133/76  A/P:  Stable. Continue current medications.     No problem-specific Assessment & Plan notes found for this encounter.   Recommended follow ZE:SPQZRA in about 4 months (around 04/30/2021) for follow up- or sooner if needed. Future Appointments  Date Time Provider [place]  01/18/2021  1:15 PM [facility] None  01/18/2021  1:45 PM Curt Bears, MD [facility] None  02/04/2021  1:15 PM Marzetta Board, DPM [facility]  05/03/2021   1:40 PM [name], MD [facility]  11/10/2021  9:30 AM [facility] HEALTH COACH [facility]    Lab/Order associations:   ICD-10-CM   1. Chronic right shoulder pain  M25.511 Ambulatory referral to Sports Medicine   G89.29   2. Left wrist pain  M25.532 Ambulatory referral  to Sports Medicine  3. Type 2 diabetes mellitus with stage 3b chronic kidney disease, without long-term current use of insulin (HCC)  E11.22 Hemoglobin A1c   N18.32   4. Epigastric abdominal pain  R10.13 Ambulatory referral to Gastroenterology  5. Early satiety  R68.81 Ambulatory referral to Gastroenterology  6. Chronic kidney disease (CKD) stage G3b/A1, moderately decreased glomerular filtration rate (GFR) between 30-44 mL/min/1.73 square meter and albuminuria creatinine ratio less than 30 mg/g (HCC)  N18.32   7. Hypertension associated with diabetes ([employer])  E11.59    I15.2   I,Alexis Bryant,acting as a scribe for [name], MD.,have documented all relevant documentation on the behalf of [name], MD,as directed by  [name], MD while in the presence of [name], MD.  I, [name], MD, have reviewed all documentation for this visit. The documentation on 12/28/20 for the exam, diagnosis, procedures, and orders are all accurate and complete.  Return precautions advised.  [name], MD

## 2020-12-28 NOTE — Patient Instructions (Addendum)
Health Maintenance Due  Topic Date Due  . OPHTHALMOLOGY EXAM Needs To scheduled.  09/07/2020   We will call you within two weeks about your referral to sports medicine to evaluate shoulder pina. If you do not hear within 2 weeks, give [place] a call.   Please stop by lab before you go If you have mychart- we will send your results within 3 business days of [place] receiving them.  If you do not have mychart- we will call you about results within 5 business days of [place] receiving them.  *please also note that you will see labs on mychart as soon as they post. I will later go in and write notes on them- will say "notes from Dr. Yong Channel"  No changes today unless labs lead [place] to make changes  We will call you within two weeks about your referral to Gi. If you do not hear within 2 weeks, give [place] a call.    Recommended follow up: Return in about 4 months (around 04/30/2021) for follow up- or sooner if needed.

## 2020-12-30 ENCOUNTER — Ambulatory Visit: Payer: Self-pay

## 2020-12-30 ENCOUNTER — Ambulatory Visit (INDEPENDENT_AMBULATORY_CARE_PROVIDER_SITE_OTHER): Payer: Medicare Other | Admitting: Family Medicine

## 2020-12-30 ENCOUNTER — Other Ambulatory Visit: Payer: Self-pay

## 2020-12-30 ENCOUNTER — Ambulatory Visit (INDEPENDENT_AMBULATORY_CARE_PROVIDER_SITE_OTHER): Payer: Medicare Other

## 2020-12-30 VITALS — BP 136/80 | HR 66 | Ht 75.0 in | Wt 185.0 lb

## 2020-12-30 DIAGNOSIS — M25511 Pain in right shoulder: Secondary | ICD-10-CM

## 2020-12-30 DIAGNOSIS — R202 Paresthesia of skin: Secondary | ICD-10-CM

## 2020-12-30 DIAGNOSIS — G8929 Other chronic pain: Secondary | ICD-10-CM

## 2020-12-30 DIAGNOSIS — R2689 Other abnormalities of gait and mobility: Secondary | ICD-10-CM | POA: Diagnosis not present

## 2020-12-30 DIAGNOSIS — M25512 Pain in left shoulder: Secondary | ICD-10-CM | POA: Diagnosis not present

## 2020-12-30 NOTE — Patient Instructions (Addendum)
Thank you for coming in today.  Please get an Xray today before you leave  I've referred you to Physical Therapy.  Let us know if you don't hear from them in one week.  Plan for nerve study.   Recheck in 6 weeks.   Let me know if you have trouble.
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
## 2020-12-31 ENCOUNTER — Encounter: Payer: Self-pay | Admitting: Neurology

## 2021-01-03 NOTE — Progress Notes (Signed)
Left shoulder x-ray looks normal to radiology

## 2021-01-03 NOTE — Progress Notes (Signed)
Right shoulder x-ray looks normal to radiology

## 2021-01-05 ENCOUNTER — Other Ambulatory Visit (HOSPITAL_COMMUNITY): Payer: Self-pay

## 2021-01-06 ENCOUNTER — Ambulatory Visit (INDEPENDENT_AMBULATORY_CARE_PROVIDER_SITE_OTHER): Payer: Medicare Other | Admitting: Physical Therapy

## 2021-01-06 ENCOUNTER — Other Ambulatory Visit: Payer: Self-pay

## 2021-01-06 ENCOUNTER — Encounter: Payer: Self-pay | Admitting: Physical Therapy

## 2021-01-06 DIAGNOSIS — M6281 Muscle weakness (generalized): Secondary | ICD-10-CM

## 2021-01-06 DIAGNOSIS — G8929 Other chronic pain: Secondary | ICD-10-CM

## 2021-01-06 DIAGNOSIS — R262 Difficulty in walking, not elsewhere classified: Secondary | ICD-10-CM

## 2021-01-06 DIAGNOSIS — M25511 Pain in right shoulder: Secondary | ICD-10-CM

## 2021-01-06 NOTE — Patient Instructions (Signed)
Access Code: [email or web address] URL: [email or web address] Date: 01/06/2021 Prepared by: Daleen Bo  Exercises Shoulder External Rotation and Scapular Retraction with Resistance - 1 x daily - 7 x weekly - 2 sets - 10 reps Standing Shoulder Row with Anchored Resistance - 1 x daily - 7 x weekly - 2 sets - 10 reps Sit to Stand - 1 x daily - 7 x weekly - 2 sets - 10 reps
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
## 2021-01-11 ENCOUNTER — Telehealth: Payer: Self-pay | Admitting: Internal Medicine

## 2021-01-11 ENCOUNTER — Other Ambulatory Visit (HOSPITAL_BASED_OUTPATIENT_CLINIC_OR_DEPARTMENT_OTHER): Payer: Self-pay

## 2021-01-11 ENCOUNTER — Ambulatory Visit: Payer: Medicare Other | Attending: Internal Medicine

## 2021-01-11 ENCOUNTER — Other Ambulatory Visit: Payer: Self-pay

## 2021-01-11 DIAGNOSIS — Z23 Encounter for immunization: Secondary | ICD-10-CM

## 2021-01-11 MED ORDER — PFIZER-BIONT COVID-19 VAC-TRIS 30 MCG/0.3ML IM SUSP
INTRAMUSCULAR | 0 refills | Status: DC
Start: 2021-01-11 — End: 2021-04-22
  Filled 2021-01-11: qty 0.3, 1d supply, fill #0

## 2021-01-11 NOTE — Progress Notes (Signed)
   Covid-19 Vaccination Clinic  Name:  Richard Navarro    MRN: [id number] DOB: Apr 03, 1946  01/11/2021  Mr. Schewe was observed post Covid-19 immunization for 15 minutes without incident. He was provided with Vaccine Information Sheet and instruction to access the V-Safe system.   Mr. Oestreicher was instructed to call 911 with any severe reactions post vaccine: Mar[place] Kitchen Difficulty breathing  . Swelling of face and throat  . A fast heartbeat  . A bad rash all over body  . Dizziness and weakness   Immunizations Administered    Name Date Dose VIS Date Route   PFIZER Comrnaty(Gray TOP) Covid-19 Vaccine 01/11/2021  2:23 PM 0.3 mL 07/29/2020 Intramuscular   Manufacturer: [employer]   Lot: [id number]   NDC: [phone]

## 2021-01-11 NOTE — Telephone Encounter (Signed)
R/s appts per 5/24 sch msg. Pt aware.

## 2021-01-13 ENCOUNTER — Ambulatory Visit (INDEPENDENT_AMBULATORY_CARE_PROVIDER_SITE_OTHER): Payer: Medicare Other | Admitting: Physical Therapy

## 2021-01-13 ENCOUNTER — Other Ambulatory Visit: Payer: Self-pay | Admitting: Internal Medicine

## 2021-01-13 ENCOUNTER — Encounter: Payer: Self-pay | Admitting: Physical Therapy

## 2021-01-13 ENCOUNTER — Other Ambulatory Visit: Payer: Self-pay

## 2021-01-13 ENCOUNTER — Other Ambulatory Visit (HOSPITAL_COMMUNITY): Payer: Self-pay

## 2021-01-13 VITALS — HR 87 | Resp 20

## 2021-01-13 DIAGNOSIS — R262 Difficulty in walking, not elsewhere classified: Secondary | ICD-10-CM

## 2021-01-13 DIAGNOSIS — G8929 Other chronic pain: Secondary | ICD-10-CM | POA: Diagnosis not present

## 2021-01-13 DIAGNOSIS — C7A8 Other malignant neuroendocrine tumors: Secondary | ICD-10-CM

## 2021-01-13 DIAGNOSIS — M25511 Pain in right shoulder: Secondary | ICD-10-CM | POA: Diagnosis not present

## 2021-01-13 DIAGNOSIS — M6281 Muscle weakness (generalized): Secondary | ICD-10-CM | POA: Diagnosis not present

## 2021-01-13 MED ORDER — CAPECITABINE 500 MG PO TABS
ORAL_TABLET | ORAL | 2 refills | Status: DC
  Filled 2021-02-01: qty 84, 14d supply, fill #0
  Filled 2021-02-23: qty 84, 14d supply, fill #1
  Filled 2021-03-24: qty 84, 14d supply, fill #2
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
## 2021-01-13 NOTE — Patient Instructions (Addendum)
Access Code: [email or web address] URL: [email or web address] Date: 01/13/2021 Prepared by: Daleen Bo  Exercises Shoulder External Rotation and Scapular Retraction with Resistance - 1 x daily - 7 x weekly - 2 sets - 10 reps Standing Shoulder Row with Anchored Resistance - 1 x daily - 7 x weekly - 2 sets - 10 reps Sit to Stand - 1 x daily - 7 x weekly - 2 sets - 10 reps Seated Scapular Retraction - 1 x daily - 7 x weekly - 2 sets - 10 reps

## 2021-01-18 ENCOUNTER — Ambulatory Visit: Payer: Medicare Other | Admitting: Internal Medicine

## 2021-01-18 ENCOUNTER — Other Ambulatory Visit: Payer: Medicare Other

## 2021-01-19 ENCOUNTER — Ambulatory Visit: Payer: Medicare Other | Admitting: Internal Medicine

## 2021-01-19 ENCOUNTER — Other Ambulatory Visit: Payer: Self-pay

## 2021-01-19 ENCOUNTER — Ambulatory Visit (INDEPENDENT_AMBULATORY_CARE_PROVIDER_SITE_OTHER): Payer: Medicare Other | Admitting: Neurology

## 2021-01-19 DIAGNOSIS — G5602 Carpal tunnel syndrome, left upper limb: Secondary | ICD-10-CM

## 2021-01-19 DIAGNOSIS — R202 Paresthesia of skin: Secondary | ICD-10-CM | POA: Diagnosis not present

## 2021-01-19 DIAGNOSIS — G5622 Lesion of ulnar nerve, left upper limb: Secondary | ICD-10-CM

## 2021-01-19 NOTE — Procedures (Signed)
[facility]  [address]  [place], [place] [address] Tel: [phone] Fax:  [phone] Test Date:  01/19/2021  Patient: Richard Navarro DOB: [birth year] Physician: [name], DO  Sex: Male Height: 6\' 3"  Ref Phys: [name], M.D.  ID#: [id number]   Technician:    Patient Complaints: This is a 75 year old man referred for evaluation of left hand weakness and paresthesias.  NCV & EMG Findings: Extensive electrodiagnostic testing of the left upper extremity shows:  1. Left median sensory response is absent.  Left ulnar sensory response shows prolonged latency (3.8 ms). 2. Left median motor response is absent.  Left ulnar motor response shows prolonged latency (3.2 ms) and decreased conduction velocity (A Elbow-B Elbow, 27 m/s).  3. Despite maximal activation, no motor unit recruitment is seen in the left abductor pollicis brevis muscle, which is also severely atrophied.  Chronic motor axon loss changes are seen in the left ulnar innervated muscles.  There is no evidence of accompanying active denervation.   Impression: 1. 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   2. Left ulnar neuropathy with slowing across the elbow, predominantly demyelinating, moderate-to-severe.   ___________________________ [name], DO    Nerve Conduction Studies Anti Sensory Summary Table   Stim Site NR Peak (ms) Norm Peak (ms) P-T Amp (V) Norm P-T Amp  Left Median Anti Sensory (2nd Digit)  34C  Wrist NR  <3.8  >10  Left Ulnar Anti Sensory (5th Digit)  34C  Wrist    3.8 <3.2 7.1 >5   Motor Summary Table   Stim Site NR Onset (ms) Norm Onset (ms) O-P Amp (mV) Norm O-P Amp Site1 Site2 Delta-0 (ms) Dist (cm) Vel (m/s) Norm Vel (m/s)  Left Median Motor (Abd Poll Brev)  34C  Wrist NR  <4.0  >5 Elbow Wrist  0.0  >50  Elbow NR            Left Ulnar Motor (Abd Dig Minimi)  34C  Wrist     3.2 <3.1 9.6 >7 B Elbow Wrist 5.2 26.0 50 >50  B Elbow    8.4  8.2  A Elbow B Elbow 3.7 10.0 27 >50  A Elbow    12.1  7.4          EMG   Side Muscle Ins Act Fibs Psw Fasc Number Recrt Dur Dur. Amp Amp. Poly Poly. Comment  Left 1stDorInt Nml Nml Nml Nml 2- Rapid All 1+ All 1+ All 1+ N/A  Left Abd Poll Brev Nml Nml Nml Nml NE None - - - - - - ATR  Left Ext Indicis Nml Nml Nml Nml Nml Nml Nml Nml Nml Nml Nml Nml N/A  Left PronatorTeres Nml Nml Nml Nml Nml Nml Nml Nml Nml Nml Nml Nml N/A  Left Biceps Nml Nml Nml Nml Nml Nml Nml Nml Nml Nml Nml Nml N/A  Left Triceps Nml Nml Nml Nml Nml Nml Nml Nml Nml Nml Nml Nml N/A  Left Deltoid Nml Nml Nml Nml Nml Nml Nml Nml Nml Nml Nml Nml N/A  Left ABD Dig Min Nml Nml Nml Nml 3- Rapid All 1+ All 1+ All 1+ N/A  Left FlexDigProf 4,5 Nml Nml Nml Nml 1- Rapid Some 1+ Some 1+ Many 1+ N/A      Waveforms:

## 2021-01-20 ENCOUNTER — Encounter: Payer: Self-pay | Admitting: Physical Therapy

## 2021-01-20 ENCOUNTER — Ambulatory Visit (INDEPENDENT_AMBULATORY_CARE_PROVIDER_SITE_OTHER): Payer: Medicare Other | Admitting: Physical Therapy

## 2021-01-20 DIAGNOSIS — M25511 Pain in right shoulder: Secondary | ICD-10-CM | POA: Diagnosis not present

## 2021-01-20 DIAGNOSIS — R262 Difficulty in walking, not elsewhere classified: Secondary | ICD-10-CM

## 2021-01-20 DIAGNOSIS — M6281 Muscle weakness (generalized): Secondary | ICD-10-CM

## 2021-01-20 DIAGNOSIS — G8929 Other chronic pain: Secondary | ICD-10-CM | POA: Diagnosis not present
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
## 2021-01-25 ENCOUNTER — Other Ambulatory Visit: Payer: Self-pay

## 2021-01-25 ENCOUNTER — Inpatient Hospital Stay: Payer: Medicare Other | Attending: Internal Medicine | Admitting: Internal Medicine

## 2021-01-25 ENCOUNTER — Inpatient Hospital Stay: Payer: Medicare Other

## 2021-01-25 VITALS — BP 140/74 | HR 69 | Temp 97.7°F | Resp 18 | Ht 75.0 in | Wt 184.6 lb

## 2021-01-25 DIAGNOSIS — D649 Anemia, unspecified: Secondary | ICD-10-CM | POA: Insufficient documentation

## 2021-01-25 DIAGNOSIS — Z79899 Other long term (current) drug therapy: Secondary | ICD-10-CM | POA: Diagnosis not present

## 2021-01-25 DIAGNOSIS — Z7984 Long term (current) use of oral hypoglycemic drugs: Secondary | ICD-10-CM | POA: Diagnosis not present

## 2021-01-25 DIAGNOSIS — C7B02 Secondary carcinoid tumors of liver: Secondary | ICD-10-CM | POA: Insufficient documentation

## 2021-01-25 DIAGNOSIS — E119 Type 2 diabetes mellitus without complications: Secondary | ICD-10-CM | POA: Diagnosis not present

## 2021-01-25 DIAGNOSIS — C7A8 Other malignant neuroendocrine tumors: Secondary | ICD-10-CM

## 2021-01-25 DIAGNOSIS — N189 Chronic kidney disease, unspecified: Secondary | ICD-10-CM | POA: Insufficient documentation

## 2021-01-25 DIAGNOSIS — C3491 Malignant neoplasm of unspecified part of right bronchus or lung: Secondary | ICD-10-CM

## 2021-01-25 DIAGNOSIS — C7A090 Malignant carcinoid tumor of the bronchus and lung: Secondary | ICD-10-CM | POA: Diagnosis not present

## 2021-01-25 LAB — CBC WITH DIFFERENTIAL (CANCER CENTER ONLY)
Abs Immature Granulocytes: 0.02 10*3/uL (ref 0.00–0.07)
Basophils Absolute: 0 10*3/uL (ref 0.0–0.1)
Basophils Relative: 1 %
Eosinophils Absolute: 0.1 10*3/uL (ref 0.0–0.5)
Eosinophils Relative: 1 %
HCT: 33.3 % — ABNORMAL LOW (ref 39.0–52.0)
Hemoglobin: 11.7 g/dL — ABNORMAL LOW (ref 13.0–17.0)
Immature Granulocytes: 0 %
Lymphocytes Relative: 13 %
Lymphs Abs: 0.6 10*3/uL — ABNORMAL LOW (ref 0.7–4.0)
MCH: 33.7 pg (ref 26.0–34.0)
MCHC: 35.1 g/dL (ref 30.0–36.0)
MCV: 96 fL (ref 80.0–100.0)
Monocytes Absolute: 0.5 10*3/uL (ref 0.1–1.0)
Monocytes Relative: 10 %
Neutro Abs: 3.7 10*3/uL (ref 1.7–7.7)
Neutrophils Relative %: 75 %
Platelet Count: 178 10*3/uL (ref 150–400)
RBC: 3.47 MIL/uL — ABNORMAL LOW (ref 4.22–5.81)
RDW: 15.3 % (ref 11.5–15.5)
WBC Count: 4.9 10*3/uL (ref 4.0–10.5)
nRBC: 0.4 % — ABNORMAL HIGH (ref 0.0–0.2)

## 2021-01-25 LAB — CMP (CANCER CENTER ONLY)
ALT: 25 U/L (ref 0–44)
AST: 21 U/L (ref 15–41)
Albumin: 3.7 g/dL (ref 3.5–5.0)
Alkaline Phosphatase: 71 U/L (ref 38–126)
Anion gap: 10 (ref 5–15)
BUN: 23 mg/dL (ref 8–23)
CO2: 25 mmol/L (ref 22–32)
Calcium: 9.6 mg/dL (ref 8.9–10.3)
Chloride: 104 mmol/L (ref 98–111)
Creatinine: 2 mg/dL — ABNORMAL HIGH (ref 0.61–1.24)
GFR, Estimated: 34 mL/min — ABNORMAL LOW (ref >60)
Glucose, Bld: 145 mg/dL — ABNORMAL HIGH (ref 70–99)
Potassium: 4.1 mmol/L (ref 3.5–5.1)
Sodium: 139 mmol/L (ref 135–145)
Total Bilirubin: 0.6 mg/dL (ref 0.3–1.2)
Total Protein: 7.7 g/dL (ref 6.5–8.1)
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
## 2021-01-27 ENCOUNTER — Other Ambulatory Visit: Payer: Self-pay

## 2021-01-27 ENCOUNTER — Ambulatory Visit (INDEPENDENT_AMBULATORY_CARE_PROVIDER_SITE_OTHER): Payer: Medicare Other | Admitting: Physical Therapy

## 2021-01-27 ENCOUNTER — Telehealth: Payer: Self-pay | Admitting: Internal Medicine

## 2021-01-27 DIAGNOSIS — G8929 Other chronic pain: Secondary | ICD-10-CM | POA: Diagnosis not present

## 2021-01-27 DIAGNOSIS — M6281 Muscle weakness (generalized): Secondary | ICD-10-CM | POA: Diagnosis not present

## 2021-01-27 DIAGNOSIS — M25511 Pain in right shoulder: Secondary | ICD-10-CM | POA: Diagnosis not present

## 2021-01-27 DIAGNOSIS — R262 Difficulty in walking, not elsewhere classified: Secondary | ICD-10-CM | POA: Diagnosis not present
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
## 2021-01-27 NOTE — Telephone Encounter (Signed)
Scheduled per los. Called and spoke with patient. Confirmed appt 

## 2021-01-31 ENCOUNTER — Other Ambulatory Visit (HOSPITAL_COMMUNITY): Payer: Self-pay

## 2021-02-01 ENCOUNTER — Other Ambulatory Visit (HOSPITAL_COMMUNITY): Payer: Self-pay

## 2021-02-01 MED FILL — Temozolomide Cap 100 MG: ORAL | 28 days supply | Qty: 20 | Fill #2 | Status: AC

## 2021-02-02 ENCOUNTER — Other Ambulatory Visit (HOSPITAL_COMMUNITY): Payer: Self-pay

## 2021-02-02 ENCOUNTER — Encounter: Payer: Medicare Other | Admitting: Neurology

## 2021-02-03 ENCOUNTER — Other Ambulatory Visit: Payer: Self-pay

## 2021-02-03 ENCOUNTER — Encounter: Payer: Self-pay | Admitting: Physical Therapy

## 2021-02-03 ENCOUNTER — Ambulatory Visit (INDEPENDENT_AMBULATORY_CARE_PROVIDER_SITE_OTHER): Payer: Medicare Other | Admitting: Physical Therapy

## 2021-02-03 DIAGNOSIS — M6281 Muscle weakness (generalized): Secondary | ICD-10-CM | POA: Diagnosis not present

## 2021-02-03 DIAGNOSIS — M25532 Pain in left wrist: Secondary | ICD-10-CM

## 2021-02-03 DIAGNOSIS — M25511 Pain in right shoulder: Secondary | ICD-10-CM

## 2021-02-03 DIAGNOSIS — G8929 Other chronic pain: Secondary | ICD-10-CM | POA: Diagnosis not present

## 2021-02-03 DIAGNOSIS — R262 Difficulty in walking, not elsewhere classified: Secondary | ICD-10-CM | POA: Diagnosis not present

## 2021-02-03 NOTE — Patient Instructions (Signed)
Access Code: [email or web address] URL: [email or web address] Date: 02/03/2021 Prepared by: Daleen Bo  Exercises Shoulder External Rotation and Scapular Retraction with Resistance - 1 x daily - 7 x weekly - 2 sets - 10 reps Standing Shoulder Row with Anchored Resistance - 1 x daily - 7 x weekly - 2 sets - 10 reps Sit to Stand - 1 x daily - 7 x weekly - 2 sets - 10 reps Seated Scapular Retraction - 1 x daily - 7 x weekly - 2 sets - 10 reps Wall Push Up - 1 x daily - 7 x weekly - 2 sets - 10 reps Single Arm Doorway Pec Stretch at 90 Degrees Abduction - 2 x daily - 7 x weekly - 1 sets - 3 reps - 30 hold
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
## 2021-02-04 ENCOUNTER — Ambulatory Visit (INDEPENDENT_AMBULATORY_CARE_PROVIDER_SITE_OTHER): Payer: Medicare Other | Admitting: Podiatry

## 2021-02-04 ENCOUNTER — Encounter: Payer: Self-pay | Admitting: Podiatry

## 2021-02-04 DIAGNOSIS — B351 Tinea unguium: Secondary | ICD-10-CM | POA: Diagnosis not present

## 2021-02-04 DIAGNOSIS — M79674 Pain in right toe(s): Secondary | ICD-10-CM | POA: Diagnosis not present

## 2021-02-04 DIAGNOSIS — M79675 Pain in left toe(s): Secondary | ICD-10-CM
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
## 2021-02-10 ENCOUNTER — Encounter: Payer: Self-pay | Admitting: Physical Therapy

## 2021-02-10 ENCOUNTER — Ambulatory Visit (INDEPENDENT_AMBULATORY_CARE_PROVIDER_SITE_OTHER): Payer: Medicare Other | Admitting: Physical Therapy

## 2021-02-10 ENCOUNTER — Other Ambulatory Visit: Payer: Self-pay

## 2021-02-10 ENCOUNTER — Encounter: Payer: Self-pay | Admitting: Family Medicine

## 2021-02-10 ENCOUNTER — Ambulatory Visit (INDEPENDENT_AMBULATORY_CARE_PROVIDER_SITE_OTHER): Payer: Medicare Other | Admitting: Family Medicine

## 2021-02-10 VITALS — BP 104/60 | HR 63 | Ht 75.0 in | Wt 186.6 lb

## 2021-02-10 DIAGNOSIS — M25511 Pain in right shoulder: Secondary | ICD-10-CM

## 2021-02-10 DIAGNOSIS — R202 Paresthesia of skin: Secondary | ICD-10-CM

## 2021-02-10 DIAGNOSIS — G8929 Other chronic pain: Secondary | ICD-10-CM

## 2021-02-10 DIAGNOSIS — M6281 Muscle weakness (generalized): Secondary | ICD-10-CM

## 2021-02-10 DIAGNOSIS — G5602 Carpal tunnel syndrome, left upper limb: Secondary | ICD-10-CM

## 2021-02-10 DIAGNOSIS — R262 Difficulty in walking, not elsewhere classified: Secondary | ICD-10-CM

## 2021-02-10 DIAGNOSIS — M25532 Pain in left wrist: Secondary | ICD-10-CM | POA: Diagnosis not present

## 2021-02-10 NOTE — Progress Notes (Signed)
Subjective: Richard Navarro is a pleasant 75 y.o. male patient seen today for at-risk foot care. He has h/o NIDDM with PAD. Patient is seen routinely for painful thick toenails that are difficult to trim. Pain interferes with ambulation. Aggravating factors include wearing enclosed shoe gear. Pain is relieved with periodic professional debridement.  He continues to undergo chemotherapy for lung cancer. He voices no new pedal problems on today's visit.  PCP is [name], MD. Last visit was: 12/28/2020.  Patient does not check blood glucose daily.  Allergies  Allergen Reactions   Lisinopril Swelling    Angioedema- on this and afinitor same time   Simvastatin Other (See Comments)    Joint ache    Objective: Physical Exam  General: Richard Navarro is a pleasant 75 y.o. African American male, WD, WN in NAD. AAO x 3.   Vascular:  Capillary fill time to digits <3 seconds b/l lower extremities. Faintly palpable DP pulse(s) b/l lower extremities. Nonpalpable PT pulse(s) b/l. Pedal hair sparse. Lower extremity skin temperature gradient within normal limits. No edema noted b/l lower extremities.  Dermatological:  Pedal skin is thin shiny, atrophic b/l lower extremities. No open wounds b/l lower extremities No interdigital macerations b/l lower extremities Toenails 1-5 b/l elongated, discolored, dystrophic, thickened, crumbly with subungual debris and tenderness to dorsal palpation.  Musculoskeletal:  Normal muscle strength 5/5 to all lower extremity muscle groups bilaterally. No pain crepitus or joint limitation noted with ROM b/l. Hammertoe(s) noted to the 1-5 bilaterally.  Neurological:  Protective sensation intact 5/5 intact bilaterally with 10g monofilament b/l.  Assessment and Plan:  1. Pain due to onychomycosis of toenails of both feet    -Examined patient. -Continue diabetic foot care principles. -Patient to continue soft, supportive shoe gear daily. -Toenails 1-5 b/l  were debrided in length and girth with sterile nail nippers and dremel without iatrogenic bleeding.  -Patient to report any pedal injuries to medical professional immediately. -Patient/POA to call should there be question/concern in the interim.  Return in about 3 months (around 05/07/2021).  Marzetta Board, DPM
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
## 2021-02-10 NOTE — Patient Instructions (Signed)
Thank you for coming in today.   I think you should see a hand surgeon to discuss a carpal tunnel surgery.

## 2021-02-17 ENCOUNTER — Other Ambulatory Visit: Payer: Self-pay

## 2021-02-17 ENCOUNTER — Ambulatory Visit (INDEPENDENT_AMBULATORY_CARE_PROVIDER_SITE_OTHER): Payer: Medicare Other | Admitting: Physical Therapy

## 2021-02-17 ENCOUNTER — Encounter: Payer: Self-pay | Admitting: Physical Therapy

## 2021-02-17 DIAGNOSIS — M6281 Muscle weakness (generalized): Secondary | ICD-10-CM

## 2021-02-17 DIAGNOSIS — G8929 Other chronic pain: Secondary | ICD-10-CM

## 2021-02-17 DIAGNOSIS — M25511 Pain in right shoulder: Secondary | ICD-10-CM | POA: Diagnosis not present

## 2021-02-17 DIAGNOSIS — R262 Difficulty in walking, not elsewhere classified: Secondary | ICD-10-CM
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
## 2021-02-21 DIAGNOSIS — Z20822 Contact with and (suspected) exposure to covid-19: Secondary | ICD-10-CM | POA: Diagnosis not present

## 2021-02-23 ENCOUNTER — Other Ambulatory Visit (HOSPITAL_COMMUNITY): Payer: Self-pay

## 2021-02-23 ENCOUNTER — Other Ambulatory Visit: Payer: Self-pay | Admitting: Internal Medicine

## 2021-02-23 MED ORDER — TEMOZOLOMIDE 100 MG PO CAPS
ORAL_CAPSULE | ORAL | 3 refills | Status: DC
  Filled 2021-02-25: qty 20, 28d supply, fill #0
  Filled 2021-03-24: qty 20, 28d supply, fill #1
  Filled 2021-04-26: qty 20, 28d supply, fill #2

## 2021-02-24 ENCOUNTER — Other Ambulatory Visit: Payer: Self-pay

## 2021-02-24 ENCOUNTER — Ambulatory Visit (INDEPENDENT_AMBULATORY_CARE_PROVIDER_SITE_OTHER): Payer: Medicare Other | Admitting: Physical Therapy

## 2021-02-24 DIAGNOSIS — M25511 Pain in right shoulder: Secondary | ICD-10-CM

## 2021-02-24 DIAGNOSIS — M6281 Muscle weakness (generalized): Secondary | ICD-10-CM

## 2021-02-24 DIAGNOSIS — G8929 Other chronic pain: Secondary | ICD-10-CM | POA: Diagnosis not present

## 2021-02-24 DIAGNOSIS — R262 Difficulty in walking, not elsewhere classified: Secondary | ICD-10-CM | POA: Diagnosis not present

## 2021-02-25 ENCOUNTER — Other Ambulatory Visit (HOSPITAL_COMMUNITY): Payer: Self-pay

## 2021-02-28 ENCOUNTER — Inpatient Hospital Stay: Payer: Medicare Other

## 2021-02-28 ENCOUNTER — Other Ambulatory Visit: Payer: Self-pay

## 2021-02-28 ENCOUNTER — Encounter: Payer: Self-pay | Admitting: Physical Therapy

## 2021-02-28 ENCOUNTER — Inpatient Hospital Stay: Payer: Medicare Other | Attending: Internal Medicine | Admitting: Internal Medicine

## 2021-02-28 VITALS — BP 146/76 | HR 66 | Temp 98.3°F | Resp 18 | Ht 75.0 in | Wt 188.8 lb

## 2021-02-28 DIAGNOSIS — E119 Type 2 diabetes mellitus without complications: Secondary | ICD-10-CM | POA: Diagnosis not present

## 2021-02-28 DIAGNOSIS — C3490 Malignant neoplasm of unspecified part of unspecified bronchus or lung: Secondary | ICD-10-CM | POA: Diagnosis not present

## 2021-02-28 DIAGNOSIS — C3491 Malignant neoplasm of unspecified part of right bronchus or lung: Secondary | ICD-10-CM

## 2021-02-28 DIAGNOSIS — N189 Chronic kidney disease, unspecified: Secondary | ICD-10-CM | POA: Insufficient documentation

## 2021-02-28 DIAGNOSIS — C7B02 Secondary carcinoid tumors of liver: Secondary | ICD-10-CM | POA: Insufficient documentation

## 2021-02-28 DIAGNOSIS — I10 Essential (primary) hypertension: Secondary | ICD-10-CM | POA: Diagnosis not present

## 2021-02-28 DIAGNOSIS — E785 Hyperlipidemia, unspecified: Secondary | ICD-10-CM | POA: Diagnosis not present

## 2021-02-28 DIAGNOSIS — Z7984 Long term (current) use of oral hypoglycemic drugs: Secondary | ICD-10-CM | POA: Insufficient documentation

## 2021-02-28 DIAGNOSIS — C7A090 Malignant carcinoid tumor of the bronchus and lung: Secondary | ICD-10-CM | POA: Diagnosis not present

## 2021-02-28 DIAGNOSIS — Z79899 Other long term (current) drug therapy: Secondary | ICD-10-CM | POA: Diagnosis not present

## 2021-02-28 DIAGNOSIS — J439 Emphysema, unspecified: Secondary | ICD-10-CM | POA: Insufficient documentation

## 2021-02-28 DIAGNOSIS — Z5111 Encounter for antineoplastic chemotherapy: Secondary | ICD-10-CM

## 2021-02-28 DIAGNOSIS — C7A8 Other malignant neuroendocrine tumors: Secondary | ICD-10-CM | POA: Diagnosis not present

## 2021-02-28 DIAGNOSIS — C3431 Malignant neoplasm of lower lobe, right bronchus or lung: Secondary | ICD-10-CM | POA: Diagnosis not present

## 2021-02-28 LAB — CBC WITH DIFFERENTIAL (CANCER CENTER ONLY)
Abs Immature Granulocytes: 0.01 10*3/uL (ref 0.00–0.07)
Basophils Absolute: 0 10*3/uL (ref 0.0–0.1)
Basophils Relative: 0 %
Eosinophils Absolute: 0.1 10*3/uL (ref 0.0–0.5)
Eosinophils Relative: 1 %
HCT: 33.3 % — ABNORMAL LOW (ref 39.0–52.0)
Hemoglobin: 11.6 g/dL — ABNORMAL LOW (ref 13.0–17.0)
Immature Granulocytes: 0 %
Lymphocytes Relative: 17 %
Lymphs Abs: 0.6 10*3/uL — ABNORMAL LOW (ref 0.7–4.0)
MCH: 34.3 pg — ABNORMAL HIGH (ref 26.0–34.0)
MCHC: 34.8 g/dL (ref 30.0–36.0)
MCV: 98.5 fL (ref 80.0–100.0)
Monocytes Absolute: 0.5 10*3/uL (ref 0.1–1.0)
Monocytes Relative: 14 %
Neutro Abs: 2.4 10*3/uL (ref 1.7–7.7)
Neutrophils Relative %: 68 %
Platelet Count: 163 10*3/uL (ref 150–400)
RBC: 3.38 MIL/uL — ABNORMAL LOW (ref 4.22–5.81)
RDW: 16.2 % — ABNORMAL HIGH (ref 11.5–15.5)
WBC Count: 3.6 10*3/uL — ABNORMAL LOW (ref 4.0–10.5)
nRBC: 0 % (ref 0.0–0.2)

## 2021-02-28 LAB — CMP (CANCER CENTER ONLY)
ALT: 9 U/L (ref 0–44)
AST: 13 U/L — ABNORMAL LOW (ref 15–41)
Albumin: 3.4 g/dL — ABNORMAL LOW (ref 3.5–5.0)
Alkaline Phosphatase: 69 U/L (ref 38–126)
Anion gap: 9 (ref 5–15)
BUN: 20 mg/dL (ref 8–23)
CO2: 27 mmol/L (ref 22–32)
Calcium: 9.6 mg/dL (ref 8.9–10.3)
Chloride: 103 mmol/L (ref 98–111)
Creatinine: 1.71 mg/dL — ABNORMAL HIGH (ref 0.61–1.24)
GFR, Estimated: 41 mL/min — ABNORMAL LOW (ref >60)
Glucose, Bld: 168 mg/dL — ABNORMAL HIGH (ref 70–99)
Potassium: 4.2 mmol/L (ref 3.5–5.1)
Sodium: 139 mmol/L (ref 135–145)
Total Bilirubin: 0.3 mg/dL (ref 0.3–1.2)
Total Protein: 7.3 g/dL (ref 6.5–8.1)
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
## 2021-03-01 ENCOUNTER — Telehealth: Payer: Self-pay | Admitting: Internal Medicine

## 2021-03-01 NOTE — Telephone Encounter (Signed)
Scheduled per los. Called, not able to leave msg. Mailed printout  

## 2021-03-02 ENCOUNTER — Other Ambulatory Visit (HOSPITAL_COMMUNITY): Payer: Self-pay

## 2021-03-03 ENCOUNTER — Encounter: Payer: Self-pay | Admitting: Physical Therapy

## 2021-03-03 ENCOUNTER — Other Ambulatory Visit: Payer: Self-pay

## 2021-03-03 ENCOUNTER — Ambulatory Visit (INDEPENDENT_AMBULATORY_CARE_PROVIDER_SITE_OTHER): Payer: Medicare Other | Admitting: Physical Therapy

## 2021-03-03 DIAGNOSIS — G8929 Other chronic pain: Secondary | ICD-10-CM

## 2021-03-03 DIAGNOSIS — M6281 Muscle weakness (generalized): Secondary | ICD-10-CM

## 2021-03-03 DIAGNOSIS — G5602 Carpal tunnel syndrome, left upper limb: Secondary | ICD-10-CM | POA: Diagnosis not present

## 2021-03-03 DIAGNOSIS — R262 Difficulty in walking, not elsewhere classified: Secondary | ICD-10-CM | POA: Diagnosis not present

## 2021-03-03 DIAGNOSIS — G5622 Lesion of ulnar nerve, left upper limb: Secondary | ICD-10-CM | POA: Diagnosis not present

## 2021-03-03 DIAGNOSIS — M25511 Pain in right shoulder: Secondary | ICD-10-CM
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
## 2021-03-10 ENCOUNTER — Other Ambulatory Visit: Payer: Self-pay

## 2021-03-10 ENCOUNTER — Ambulatory Visit (INDEPENDENT_AMBULATORY_CARE_PROVIDER_SITE_OTHER): Payer: Medicare Other | Admitting: Physical Therapy

## 2021-03-10 ENCOUNTER — Encounter: Payer: Self-pay | Admitting: Physical Therapy

## 2021-03-10 DIAGNOSIS — R262 Difficulty in walking, not elsewhere classified: Secondary | ICD-10-CM

## 2021-03-10 DIAGNOSIS — M6281 Muscle weakness (generalized): Secondary | ICD-10-CM

## 2021-03-10 DIAGNOSIS — G8929 Other chronic pain: Secondary | ICD-10-CM | POA: Diagnosis not present

## 2021-03-10 DIAGNOSIS — M25511 Pain in right shoulder: Secondary | ICD-10-CM
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
## 2021-03-14 ENCOUNTER — Encounter: Payer: TRICARE For Life (TFL) | Admitting: Physical Therapy

## 2021-03-21 ENCOUNTER — Other Ambulatory Visit (HOSPITAL_COMMUNITY): Payer: Self-pay

## 2021-03-21 ENCOUNTER — Other Ambulatory Visit: Payer: Self-pay

## 2021-03-21 ENCOUNTER — Encounter: Payer: Self-pay | Admitting: Physical Therapy

## 2021-03-21 ENCOUNTER — Ambulatory Visit (INDEPENDENT_AMBULATORY_CARE_PROVIDER_SITE_OTHER): Payer: Medicare Other | Admitting: Physical Therapy

## 2021-03-21 DIAGNOSIS — M6281 Muscle weakness (generalized): Secondary | ICD-10-CM | POA: Diagnosis not present

## 2021-03-21 DIAGNOSIS — G8929 Other chronic pain: Secondary | ICD-10-CM | POA: Diagnosis not present

## 2021-03-21 DIAGNOSIS — M25511 Pain in right shoulder: Secondary | ICD-10-CM | POA: Diagnosis not present

## 2021-03-21 DIAGNOSIS — R262 Difficulty in walking, not elsewhere classified: Secondary | ICD-10-CM | POA: Diagnosis not present
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
## 2021-03-24 ENCOUNTER — Other Ambulatory Visit (HOSPITAL_COMMUNITY): Payer: Self-pay

## 2021-03-28 ENCOUNTER — Other Ambulatory Visit: Payer: Self-pay

## 2021-03-28 ENCOUNTER — Ambulatory Visit (INDEPENDENT_AMBULATORY_CARE_PROVIDER_SITE_OTHER): Payer: Medicare Other | Admitting: Physical Therapy

## 2021-03-28 DIAGNOSIS — R262 Difficulty in walking, not elsewhere classified: Secondary | ICD-10-CM

## 2021-03-28 DIAGNOSIS — G8929 Other chronic pain: Secondary | ICD-10-CM

## 2021-03-28 DIAGNOSIS — M25511 Pain in right shoulder: Secondary | ICD-10-CM | POA: Diagnosis not present

## 2021-03-28 DIAGNOSIS — M6281 Muscle weakness (generalized): Secondary | ICD-10-CM | POA: Diagnosis not present

## 2021-03-29 DIAGNOSIS — N184 Chronic kidney disease, stage 4 (severe): Secondary | ICD-10-CM | POA: Diagnosis not present

## 2021-03-29 DIAGNOSIS — C7A8 Other malignant neuroendocrine tumors: Secondary | ICD-10-CM | POA: Diagnosis not present

## 2021-03-29 DIAGNOSIS — I129 Hypertensive chronic kidney disease with stage 1 through stage 4 chronic kidney disease, or unspecified chronic kidney disease: Secondary | ICD-10-CM | POA: Diagnosis not present

## 2021-03-29 DIAGNOSIS — D631 Anemia in chronic kidney disease: Secondary | ICD-10-CM | POA: Diagnosis not present

## 2021-03-30 ENCOUNTER — Encounter: Payer: Self-pay | Admitting: Physical Therapy

## 2021-03-30 ENCOUNTER — Other Ambulatory Visit (HOSPITAL_COMMUNITY): Payer: Self-pay
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
## 2021-04-04 ENCOUNTER — Encounter: Payer: TRICARE For Life (TFL) | Admitting: Physical Therapy

## 2021-04-07 ENCOUNTER — Encounter: Payer: Self-pay | Admitting: Physician Assistant

## 2021-04-11 ENCOUNTER — Encounter: Payer: TRICARE For Life (TFL) | Admitting: Physical Therapy

## 2021-04-16 ENCOUNTER — Emergency Department (HOSPITAL_COMMUNITY): Payer: Medicare Other

## 2021-04-16 ENCOUNTER — Inpatient Hospital Stay (HOSPITAL_COMMUNITY)
Admission: EM | Admit: 2021-04-16 | Discharge: 2021-04-22 | DRG: 871 | Disposition: A | Discharge status: Home / Self Care | Payer: Medicare Other | Attending: Internal Medicine | Admitting: Internal Medicine

## 2021-04-16 ENCOUNTER — Other Ambulatory Visit: Payer: Self-pay

## 2021-04-16 DIAGNOSIS — A419 Sepsis, unspecified organism: Secondary | ICD-10-CM | POA: Diagnosis present

## 2021-04-16 DIAGNOSIS — E1159 Type 2 diabetes mellitus with other circulatory complications: Secondary | ICD-10-CM | POA: Diagnosis not present

## 2021-04-16 DIAGNOSIS — K449 Diaphragmatic hernia without obstruction or gangrene: Secondary | ICD-10-CM | POA: Diagnosis present

## 2021-04-16 DIAGNOSIS — C3491 Malignant neoplasm of unspecified part of right bronchus or lung: Secondary | ICD-10-CM | POA: Diagnosis present

## 2021-04-16 DIAGNOSIS — Z7984 Long term (current) use of oral hypoglycemic drugs: Secondary | ICD-10-CM

## 2021-04-16 DIAGNOSIS — R6520 Severe sepsis without septic shock: Secondary | ICD-10-CM | POA: Diagnosis not present

## 2021-04-16 DIAGNOSIS — Z801 Family history of malignant neoplasm of trachea, bronchus and lung: Secondary | ICD-10-CM | POA: Diagnosis not present

## 2021-04-16 DIAGNOSIS — M109 Gout, unspecified: Secondary | ICD-10-CM | POA: Diagnosis present

## 2021-04-16 DIAGNOSIS — Z833 Family history of diabetes mellitus: Secondary | ICD-10-CM

## 2021-04-16 DIAGNOSIS — I152 Hypertension secondary to endocrine disorders: Secondary | ICD-10-CM | POA: Diagnosis present

## 2021-04-16 DIAGNOSIS — N1832 Chronic kidney disease, stage 3b: Secondary | ICD-10-CM | POA: Diagnosis present

## 2021-04-16 DIAGNOSIS — E1129 Type 2 diabetes mellitus with other diabetic kidney complication: Secondary | ICD-10-CM | POA: Diagnosis present

## 2021-04-16 DIAGNOSIS — M1990 Unspecified osteoarthritis, unspecified site: Secondary | ICD-10-CM | POA: Diagnosis present

## 2021-04-16 DIAGNOSIS — J439 Emphysema, unspecified: Secondary | ICD-10-CM | POA: Diagnosis present

## 2021-04-16 DIAGNOSIS — G9340 Encephalopathy, unspecified: Secondary | ICD-10-CM | POA: Diagnosis present

## 2021-04-16 DIAGNOSIS — E785 Hyperlipidemia, unspecified: Secondary | ICD-10-CM | POA: Diagnosis present

## 2021-04-16 DIAGNOSIS — R509 Fever, unspecified: Secondary | ICD-10-CM | POA: Diagnosis not present

## 2021-04-16 DIAGNOSIS — C787 Secondary malignant neoplasm of liver and intrahepatic bile duct: Secondary | ICD-10-CM | POA: Diagnosis present

## 2021-04-16 DIAGNOSIS — R1310 Dysphagia, unspecified: Secondary | ICD-10-CM

## 2021-04-16 DIAGNOSIS — R531 Weakness: Secondary | ICD-10-CM | POA: Diagnosis not present

## 2021-04-16 DIAGNOSIS — Z800 Family history of malignant neoplasm of digestive organs: Secondary | ICD-10-CM | POA: Diagnosis not present

## 2021-04-16 DIAGNOSIS — I10 Essential (primary) hypertension: Secondary | ICD-10-CM | POA: Diagnosis not present

## 2021-04-16 DIAGNOSIS — C3431 Malignant neoplasm of lower lobe, right bronchus or lung: Secondary | ICD-10-CM | POA: Diagnosis present

## 2021-04-16 DIAGNOSIS — M25529 Pain in unspecified elbow: Secondary | ICD-10-CM

## 2021-04-16 DIAGNOSIS — J9601 Acute respiratory failure with hypoxia: Secondary | ICD-10-CM | POA: Diagnosis present

## 2021-04-16 DIAGNOSIS — M25522 Pain in left elbow: Secondary | ICD-10-CM | POA: Diagnosis not present

## 2021-04-16 DIAGNOSIS — Z85118 Personal history of other malignant neoplasm of bronchus and lung: Secondary | ICD-10-CM | POA: Diagnosis not present

## 2021-04-16 DIAGNOSIS — R0609 Other forms of dyspnea: Secondary | ICD-10-CM | POA: Diagnosis not present

## 2021-04-16 DIAGNOSIS — K219 Gastro-esophageal reflux disease without esophagitis: Secondary | ICD-10-CM | POA: Diagnosis present

## 2021-04-16 DIAGNOSIS — R0902 Hypoxemia: Secondary | ICD-10-CM | POA: Diagnosis not present

## 2021-04-16 DIAGNOSIS — Z66 Do not resuscitate: Secondary | ICD-10-CM | POA: Diagnosis present

## 2021-04-16 DIAGNOSIS — E872 Acidosis, unspecified: Secondary | ICD-10-CM

## 2021-04-16 DIAGNOSIS — F4310 Post-traumatic stress disorder, unspecified: Secondary | ICD-10-CM | POA: Diagnosis present

## 2021-04-16 DIAGNOSIS — R9431 Abnormal electrocardiogram [ECG] [EKG]: Secondary | ICD-10-CM

## 2021-04-16 DIAGNOSIS — Z8249 Family history of ischemic heart disease and other diseases of the circulatory system: Secondary | ICD-10-CM

## 2021-04-16 DIAGNOSIS — R000 Tachycardia, unspecified: Secondary | ICD-10-CM | POA: Diagnosis not present

## 2021-04-16 DIAGNOSIS — Z888 Allergy status to other drugs, medicaments and biological substances status: Secondary | ICD-10-CM

## 2021-04-16 DIAGNOSIS — D631 Anemia in chronic kidney disease: Secondary | ICD-10-CM | POA: Diagnosis present

## 2021-04-16 DIAGNOSIS — Z20822 Contact with and (suspected) exposure to covid-19: Secondary | ICD-10-CM | POA: Diagnosis present

## 2021-04-16 DIAGNOSIS — J449 Chronic obstructive pulmonary disease, unspecified: Secondary | ICD-10-CM | POA: Diagnosis present

## 2021-04-16 DIAGNOSIS — M7989 Other specified soft tissue disorders: Secondary | ICD-10-CM | POA: Diagnosis not present

## 2021-04-16 DIAGNOSIS — E1122 Type 2 diabetes mellitus with diabetic chronic kidney disease: Secondary | ICD-10-CM | POA: Diagnosis present

## 2021-04-16 DIAGNOSIS — Z79899 Other long term (current) drug therapy: Secondary | ICD-10-CM

## 2021-04-16 DIAGNOSIS — Z9981 Dependence on supplemental oxygen: Secondary | ICD-10-CM

## 2021-04-16 DIAGNOSIS — Z87891 Personal history of nicotine dependence: Secondary | ICD-10-CM

## 2021-04-16 DIAGNOSIS — Z923 Personal history of irradiation: Secondary | ICD-10-CM

## 2021-04-16 DIAGNOSIS — J90 Pleural effusion, not elsewhere classified: Secondary | ICD-10-CM | POA: Diagnosis not present

## 2021-04-16 DIAGNOSIS — R0689 Other abnormalities of breathing: Secondary | ICD-10-CM | POA: Diagnosis not present

## 2021-04-16 DIAGNOSIS — Z85528 Personal history of other malignant neoplasm of kidney: Secondary | ICD-10-CM | POA: Diagnosis not present

## 2021-04-16 LAB — CBC WITH DIFFERENTIAL/PLATELET
Abs Immature Granulocytes: 0.02 10*3/uL (ref 0.00–0.07)
Basophils Absolute: 0 10*3/uL (ref 0.0–0.1)
Basophils Relative: 0 %
Eosinophils Absolute: 0.1 10*3/uL (ref 0.0–0.5)
Eosinophils Relative: 1 %
HCT: 32.6 % — ABNORMAL LOW (ref 39.0–52.0)
Hemoglobin: 11.2 g/dL — ABNORMAL LOW (ref 13.0–17.0)
Immature Granulocytes: 0 %
Lymphocytes Relative: 5 %
Lymphs Abs: 0.3 10*3/uL — ABNORMAL LOW (ref 0.7–4.0)
MCH: 33.8 pg (ref 26.0–34.0)
MCHC: 34.4 g/dL (ref 30.0–36.0)
MCV: 98.5 fL (ref 80.0–100.0)
Monocytes Absolute: 0.2 10*3/uL (ref 0.1–1.0)
Monocytes Relative: 4 %
Neutro Abs: 5.8 10*3/uL (ref 1.7–7.7)
Neutrophils Relative %: 90 %
Platelets: 156 10*3/uL (ref 150–400)
RBC: 3.31 MIL/uL — ABNORMAL LOW (ref 4.22–5.81)
RDW: 13.9 % (ref 11.5–15.5)
WBC: 6.5 10*3/uL (ref 4.0–10.5)
nRBC: 0 % (ref 0.0–0.2)

## 2021-04-16 LAB — APTT: aPTT: 31 seconds (ref 24–36)

## 2021-04-16 LAB — COMPREHENSIVE METABOLIC PANEL
ALT: 13 U/L (ref 0–44)
AST: 20 U/L (ref 15–41)
Albumin: 3.8 g/dL (ref 3.5–5.0)
Alkaline Phosphatase: 67 U/L (ref 38–126)
Anion gap: 10 (ref 5–15)
BUN: 22 mg/dL (ref 8–23)
CO2: 26 mmol/L (ref 22–32)
Calcium: 9.3 mg/dL (ref 8.9–10.3)
Chloride: 100 mmol/L (ref 98–111)
Creatinine, Ser: 1.86 mg/dL — ABNORMAL HIGH (ref 0.61–1.24)
GFR, Estimated: 37 mL/min — ABNORMAL LOW (ref >60)
Glucose, Bld: 156 mg/dL — ABNORMAL HIGH (ref 70–99)
Potassium: 3.7 mmol/L (ref 3.5–5.1)
Sodium: 136 mmol/L (ref 135–145)
Total Bilirubin: 0.7 mg/dL (ref 0.3–1.2)
Total Protein: 7.8 g/dL (ref 6.5–8.1)

## 2021-04-16 LAB — RESP PANEL BY RT-PCR (FLU A&B, COVID) ARPGX2
Influenza A by PCR: NEGATIVE
Influenza B by PCR: NEGATIVE
SARS Coronavirus 2 by RT PCR: NEGATIVE

## 2021-04-16 LAB — URINALYSIS, ROUTINE W REFLEX MICROSCOPIC
Bilirubin Urine: NEGATIVE
Glucose, UA: NEGATIVE mg/dL
Hgb urine dipstick: NEGATIVE
Ketones, ur: NEGATIVE mg/dL
Leukocytes,Ua: NEGATIVE
Nitrite: NEGATIVE
Protein, ur: NEGATIVE mg/dL
Specific Gravity, Urine: 1.012 (ref 1.005–1.030)
pH: 7 (ref 5.0–8.0)

## 2021-04-16 LAB — PROTIME-INR
INR: 1 (ref 0.8–1.2)
Prothrombin Time: 13.5 seconds (ref 11.4–15.2)

## 2021-04-16 LAB — LACTIC ACID, PLASMA: Lactic Acid, Venous: 2.3 mmol/L (ref 0.5–1.9)

## 2021-04-16 MED ORDER — ACETAMINOPHEN 650 MG RE SUPP
650.0000 mg | Freq: Once | RECTAL | Status: AC
  Administered 2021-04-16: 650 mg via RECTAL
  Filled 2021-04-16: qty 1

## 2021-04-16 MED ORDER — METRONIDAZOLE 500 MG/100ML IV SOLN
500.0000 mg | Freq: Once | INTRAVENOUS | Status: AC
  Administered 2021-04-16: 500 mg via INTRAVENOUS
  Filled 2021-04-16: qty 100

## 2021-04-16 MED ORDER — VANCOMYCIN HCL IN DEXTROSE 1-5 GM/200ML-% IV SOLN
1000.0000 mg | Freq: Once | INTRAVENOUS | Status: AC
  Administered 2021-04-16: 1000 mg via INTRAVENOUS
  Filled 2021-04-16: qty 200

## 2021-04-16 MED ORDER — SODIUM CHLORIDE 0.9 % IV SOLN
2.0000 g | Freq: Once | INTRAVENOUS | Status: AC
  Administered 2021-04-16: 2 g via INTRAVENOUS
  Filled 2021-04-16: qty 2

## 2021-04-16 MED ORDER — SODIUM CHLORIDE 0.9 % IV BOLUS (SEPSIS)
2000.0000 mL | Freq: Once | INTRAVENOUS | Status: AC
  Administered 2021-04-16: 2000 mL via INTRAVENOUS
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
## 2021-04-16 NOTE — ED Triage Notes (Signed)
Pt came in via EMS with c/o fever and weakness. Pt has cancer pt receiving chemo. Pt  has lung and renal cancer. Pt initial O2 on arrival was 88% on 3L. Rectal temp here is 103.4f. Pt A+O times two

## 2021-04-16 NOTE — Sepsis Progress Note (Signed)
Following for sepsis monitoring ?

## 2021-04-16 NOTE — Progress Notes (Signed)
A consult was received from an ED physician for vancomycin and cefepime per pharmacy dosing.  The patient's profile has been reviewed for ht/wt/allergies/indication/available labs.   A one time order has been placed for vancomycin 1gm and cefepime 2gm.    Further antibiotics/pharmacy consults should be ordered by admitting physician if indicated.                       Thank you, Dolly Rias RPh 04/16/2021, 10:49 PM

## 2021-04-17 ENCOUNTER — Encounter (HOSPITAL_COMMUNITY): Payer: Self-pay | Admitting: Family Medicine

## 2021-04-17 DIAGNOSIS — N1832 Chronic kidney disease, stage 3b: Secondary | ICD-10-CM

## 2021-04-17 DIAGNOSIS — A419 Sepsis, unspecified organism: Principal | ICD-10-CM

## 2021-04-17 DIAGNOSIS — R6520 Severe sepsis without septic shock: Secondary | ICD-10-CM

## 2021-04-17 DIAGNOSIS — E1122 Type 2 diabetes mellitus with diabetic chronic kidney disease: Secondary | ICD-10-CM

## 2021-04-17 DIAGNOSIS — R6510 Systemic inflammatory response syndrome (SIRS) of non-infectious origin without acute organ dysfunction: Secondary | ICD-10-CM

## 2021-04-17 DIAGNOSIS — R9431 Abnormal electrocardiogram [ECG] [EKG]: Secondary | ICD-10-CM

## 2021-04-17 DIAGNOSIS — G9340 Encephalopathy, unspecified: Secondary | ICD-10-CM

## 2021-04-17 LAB — LACTIC ACID, PLASMA
Lactic Acid, Venous: 1.8 mmol/L (ref 0.5–1.9)
Lactic Acid, Venous: 1 mmol/L (ref 0.5–1.9)

## 2021-04-17 LAB — CBC
HCT: 27.9 % — ABNORMAL LOW (ref 39.0–52.0)
Hemoglobin: 9.4 g/dL — ABNORMAL LOW (ref 13.0–17.0)
MCH: 33.8 pg (ref 26.0–34.0)
MCHC: 33.7 g/dL (ref 30.0–36.0)
MCV: 100.4 fL — ABNORMAL HIGH (ref 80.0–100.0)
Platelets: 131 10*3/uL — ABNORMAL LOW (ref 150–400)
RBC: 2.78 MIL/uL — ABNORMAL LOW (ref 4.22–5.81)
RDW: 14 % (ref 11.5–15.5)
WBC: 6.8 10*3/uL (ref 4.0–10.5)
nRBC: 0 % (ref 0.0–0.2)

## 2021-04-17 LAB — COMPREHENSIVE METABOLIC PANEL
ALT: 10 U/L (ref 0–44)
AST: 13 U/L — ABNORMAL LOW (ref 15–41)
Albumin: 3.1 g/dL — ABNORMAL LOW (ref 3.5–5.0)
Alkaline Phosphatase: 44 U/L (ref 38–126)
Anion gap: 8 (ref 5–15)
BUN: 20 mg/dL (ref 8–23)
CO2: 21 mmol/L — ABNORMAL LOW (ref 22–32)
Calcium: 8.5 mg/dL — ABNORMAL LOW (ref 8.9–10.3)
Chloride: 109 mmol/L (ref 98–111)
Creatinine, Ser: 1.7 mg/dL — ABNORMAL HIGH (ref 0.61–1.24)
GFR, Estimated: 41 mL/min — ABNORMAL LOW (ref >60)
Glucose, Bld: 110 mg/dL — ABNORMAL HIGH (ref 70–99)
Potassium: 3.5 mmol/L (ref 3.5–5.1)
Sodium: 138 mmol/L (ref 135–145)
Total Bilirubin: 0.8 mg/dL (ref 0.3–1.2)
Total Protein: 6.1 g/dL — ABNORMAL LOW (ref 6.5–8.1)

## 2021-04-17 LAB — HEMOGLOBIN A1C
Hgb A1c MFr Bld: 7 % — ABNORMAL HIGH (ref 4.8–5.6)
Mean Plasma Glucose: 154.2 mg/dL

## 2021-04-17 LAB — GLUCOSE, CAPILLARY
Glucose-Capillary: 97 mg/dL (ref 70–99)
Glucose-Capillary: 127 mg/dL — ABNORMAL HIGH (ref 70–99)
Glucose-Capillary: 126 mg/dL — ABNORMAL HIGH (ref 70–99)
Glucose-Capillary: 142 mg/dL — ABNORMAL HIGH (ref 70–99)

## 2021-04-17 MED ORDER — PANTOPRAZOLE SODIUM 40 MG PO TBEC
40.0000 mg | DELAYED_RELEASE_TABLET | Freq: Every day | ORAL | Status: DC
  Administered 2021-04-17 – 2021-04-19 (×3): 40 mg via ORAL
  Filled 2021-04-17 (×3): qty 1

## 2021-04-17 MED ORDER — VANCOMYCIN HCL IN DEXTROSE 1-5 GM/200ML-% IV SOLN: 1000.0000 mg | Freq: Once | INTRAVENOUS | Status: DC

## 2021-04-17 MED ORDER — METRONIDAZOLE 500 MG/100ML IV SOLN
500.0000 mg | Freq: Two times a day (BID) | INTRAVENOUS | Status: DC
  Administered 2021-04-17 – 2021-04-18 (×3): 500 mg via INTRAVENOUS
  Filled 2021-04-17 (×4): qty 100

## 2021-04-17 MED ORDER — AMLODIPINE BESYLATE 10 MG PO TABS
10.0000 mg | ORAL_TABLET | Freq: Every day | ORAL | Status: DC
  Administered 2021-04-17 – 2021-04-22 (×6): 10 mg via ORAL
  Filled 2021-04-17 (×6): qty 1

## 2021-04-17 MED ORDER — ACETAMINOPHEN 650 MG RE SUPP: 650.0000 mg | Freq: Four times a day (QID) | RECTAL | Status: DC | PRN

## 2021-04-17 MED ORDER — SENNOSIDES-DOCUSATE SODIUM 8.6-50 MG PO TABS
1.0000 | ORAL_TABLET | Freq: Every evening | ORAL | Status: DC | PRN
  Administered 2021-04-18 – 2021-04-19 (×2): 1 via ORAL
  Filled 2021-04-17 (×2): qty 1

## 2021-04-17 MED ORDER — METOPROLOL TARTRATE 25 MG PO TABS
25.0000 mg | ORAL_TABLET | Freq: Two times a day (BID) | ORAL | Status: DC
  Administered 2021-04-17 – 2021-04-22 (×12): 25 mg via ORAL
  Filled 2021-04-17 (×12): qty 1

## 2021-04-17 MED ORDER — TIOTROPIUM BROMIDE MONOHYDRATE 2.5 MCG/ACT IN AERS: 2.0000 | INHALATION_SPRAY | Freq: Every day | RESPIRATORY_TRACT | Status: DC

## 2021-04-17 MED ORDER — TEMOZOLOMIDE 100 MG PO CAPS: 400.0000 mg | ORAL_CAPSULE | Freq: Once | ORAL | Status: DC

## 2021-04-17 MED ORDER — ACETAMINOPHEN 325 MG PO TABS: 650.0000 mg | ORAL_TABLET | Freq: Four times a day (QID) | ORAL | Status: DC | PRN

## 2021-04-17 MED ORDER — VANCOMYCIN HCL 1750 MG/350ML IV SOLN: 1750.0000 mg | INTRAVENOUS | Status: DC

## 2021-04-17 MED ORDER — ALBUTEROL SULFATE (2.5 MG/3ML) 0.083% IN NEBU: 2.5000 mg | INHALATION_SOLUTION | RESPIRATORY_TRACT | Status: DC | PRN

## 2021-04-17 MED ORDER — UMECLIDINIUM BROMIDE 62.5 MCG/INH IN AEPB
1.0000 | INHALATION_SPRAY | Freq: Every day | RESPIRATORY_TRACT | Status: DC
  Administered 2021-04-18 – 2021-04-22 (×5): 1 via RESPIRATORY_TRACT
  Filled 2021-04-17: qty 7

## 2021-04-17 MED ORDER — VITAMIN D 25 MCG (1000 UNIT) PO TABS
1000.0000 ug | ORAL_TABLET | Freq: Every day | ORAL | Status: DC
  Filled 2021-04-17: qty 1

## 2021-04-17 MED ORDER — INSULIN ASPART 100 UNIT/ML IJ SOLN
0.0000 [IU] | Freq: Three times a day (TID) | INTRAMUSCULAR | Status: DC
  Administered 2021-04-17 – 2021-04-18 (×3): 1 [IU] via SUBCUTANEOUS
  Administered 2021-04-19 – 2021-04-21 (×4): 2 [IU] via SUBCUTANEOUS
  Administered 2021-04-22: 1 [IU] via SUBCUTANEOUS
  Filled 2021-04-17: qty 0.09

## 2021-04-17 MED ORDER — VANCOMYCIN HCL 1250 MG/250ML IV SOLN
1250.0000 mg | INTRAVENOUS | Status: DC
  Administered 2021-04-17 – 2021-04-18 (×2): 1250 mg via INTRAVENOUS
  Filled 2021-04-17 (×2): qty 250

## 2021-04-17 MED ORDER — ALBUTEROL SULFATE HFA 108 (90 BASE) MCG/ACT IN AERS: 2.0000 | INHALATION_SPRAY | RESPIRATORY_TRACT | Status: DC | PRN

## 2021-04-17 MED ORDER — ONDANSETRON HCL 4 MG PO TABS
4.0000 mg | ORAL_TABLET | Freq: Three times a day (TID) | ORAL | Status: DC | PRN
  Administered 2021-04-17: 4 mg via ORAL
  Filled 2021-04-17 (×2): qty 1

## 2021-04-17 MED ORDER — VITAMIN D 25 MCG (1000 UNIT) PO TABS
1000.0000 [IU] | ORAL_TABLET | Freq: Every day | ORAL | Status: DC
  Administered 2021-04-17 – 2021-04-22 (×6): 1000 [IU] via ORAL
  Filled 2021-04-17 (×6): qty 1

## 2021-04-17 MED ORDER — INSULIN ASPART 100 UNIT/ML IJ SOLN
0.0000 [IU] | Freq: Every day | INTRAMUSCULAR | Status: DC
  Filled 2021-04-17: qty 0.05

## 2021-04-17 MED ORDER — LACTATED RINGERS IV SOLN: INTRAVENOUS | Status: DC

## 2021-04-17 MED ORDER — SODIUM CHLORIDE 0.9 % IV SOLN
2.0000 g | Freq: Two times a day (BID) | INTRAVENOUS | Status: DC
  Administered 2021-04-17 – 2021-04-20 (×7): 2 g via INTRAVENOUS
  Filled 2021-04-17 (×7): qty 2

## 2021-04-17 MED ORDER — CALCIUM CARBONATE 1250 (500 CA) MG PO TABS
2500.0000 mg | ORAL_TABLET | Freq: Two times a day (BID) | ORAL | Status: DC
  Administered 2021-04-17 – 2021-04-22 (×11): 2500 mg via ORAL
  Filled 2021-04-17 (×11): qty 2

## 2021-04-17 MED ORDER — IBUPROFEN 200 MG PO TABS
600.0000 mg | ORAL_TABLET | Freq: Once | ORAL | Status: AC
  Administered 2021-04-17: 600 mg via ORAL
  Filled 2021-04-17: qty 3

## 2021-04-17 MED ORDER — FERROUS SULFATE 325 (65 FE) MG PO TABS
325.0000 mg | ORAL_TABLET | ORAL | Status: DC
  Administered 2021-04-17 – 2021-04-21 (×3): 325 mg via ORAL
  Filled 2021-04-17 (×4): qty 1

## 2021-04-17 MED ORDER — HEPARIN SODIUM (PORCINE) 5000 UNIT/ML IJ SOLN
5000.0000 [IU] | Freq: Three times a day (TID) | INTRAMUSCULAR | Status: DC
  Administered 2021-04-17 – 2021-04-22 (×16): 5000 [IU] via SUBCUTANEOUS
  Filled 2021-04-17 (×17): qty 1

## 2021-04-17 MED ORDER — METRONIDAZOLE 500 MG/100ML IV SOLN
500.0000 mg | Freq: Two times a day (BID) | INTRAVENOUS | Status: DC
Start: 2021-04-17 — End: 2021-04-17

## 2021-04-17 MED ORDER — SODIUM CHLORIDE 0.9 % IV SOLN: 2.0000 g | Freq: Once | INTRAVENOUS | Status: DC

## 2021-04-17 NOTE — Progress Notes (Signed)
Pharmacy Antibiotic Note  Richard Navarro is a 75 y.o. male admitted on 04/16/2021 with fever and weakness.  Pt currently being treated for lung and renal cancer. Pharmacy has been consulted to dose vancomycin and cefepime for sepsis.  Plan: Vancomycin 1gm x 1 then 1750mg  q36h (AUC 489.6, Scr 1.86) Cefepime 2gm IV q12h Follow renal function, cultures and clinical course  Height: 6\' 3"  (190.5 cm) Weight: 88.5 kg (195 lb) IBW/kg (Calculated) : 84.5  Temp (24hrs), Avg:103.2 F (39.6 C), Min:102.8 F (39.3 C), Max:103.5 F (39.7 C)  Recent Labs  Lab 04/16/21 2144  WBC 6.5  CREATININE 1.86*  LATICACIDVEN 2.3*    Estimated Creatinine Clearance: 39.8 mL/min (A) (by C-G formula based on SCr of 1.86 mg/dL (H)).    Allergies  Allergen Reactions   Lisinopril Swelling    Angioedema- on this and afinitor same time   Simvastatin Other (See Comments)    Joint ache    Antimicrobials this admission Vancomycin 8/27 >> Cefepime 8/27 >> Flagyl 8/27 x 1  Dose adjustments this admission:   Microbiology results: 8/27 BCx:  8/27 UCx:   Thank you for allowing pharmacy to be a part of this patient's care.  Angela Adam 04/17/2021 12:38 AM

## 2021-04-17 NOTE — Plan of Care (Signed)
  Problem: Health Behavior/Discharge Planning: Goal: Ability to manage health-related needs will improve 04/17/2021 0530 by Loyce Dys, RN Outcome: Progressing 04/17/2021 0530 by Loyce Dys, RN Outcome: Progressing   Problem: Clinical Measurements: Goal: Will remain free from infection 04/17/2021 0530 by Loyce Dys, RN Outcome: Progressing 04/17/2021 0530 by Loyce Dys, RN Outcome: Progressing Goal: Diagnostic test results will improve 04/17/2021 0530 by Loyce Dys, RN Outcome: Progressing 04/17/2021 0530 by Loyce Dys, RN Outcome: Progressing   Problem: Activity: Goal: Risk for activity intolerance will decrease 04/17/2021 0530 by Loyce Dys, RN Outcome: Progressing 04/17/2021 0530 by Loyce Dys, RN Outcome: Progressing   Problem: Nutrition: Goal: Adequate nutrition will be maintained 04/17/2021 0530 by Loyce Dys, RN Outcome: Progressing 04/17/2021 0530 by Loyce Dys, RN Outcome: Progressing
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
## 2021-04-17 NOTE — Progress Notes (Addendum)
Pharmacy Antibiotic Note  Richard Navarro is a 75 y.o. male admitted on 04/16/2021 with fever and weakness.  Pt currently being treated for lung and renal cancer. Pharmacy has been consulted to dose vancomycin and cefepime for sepsis.  Will adjust vancomycin maintenance dosing since Scr improving and patient did not receive full 20mg /kg loading dose. WBC 6.8, Tm 101.3, LA 1.8>1  Plan: Vancomycin 1gm x 1 received in ED Adjust vancomycin to 1250mg  q24h (AUC 501, Scr 1.7) Cefepime 2gm IV q12h Metronidazole 500mg  IV q12 hours per MD Follow renal function, cultures and clinical course Vancomycin levels as needed  Height: 6\' 3"  (190.5 cm) Weight: 85.5 kg (188 lb 7.9 oz) IBW/kg (Calculated) : 84.5  Temp (24hrs), Avg:101.6 F (38.7 C), Min:98.9 F (37.2 C), Max:103.5 F (39.7 C)  Recent Labs  Lab 04/16/21 2144 04/16/21 2344 04/17/21 0817  WBC 6.5  --  6.8  CREATININE 1.86*  --  1.70*  LATICACIDVEN 2.3* 1.8 1.0     Estimated Creatinine Clearance: 43.5 mL/min (A) (by C-G formula based on SCr of 1.7 mg/dL (H)).    Allergies  Allergen Reactions   Lisinopril Swelling    Angioedema- on this and afinitor same time   Simvastatin Other (See Comments)    Joint ache    Antimicrobials this admission Vancomycin 8/27 >> Cefepime 8/27 >> Flagyl 8/27 x 1  Dose adjustments this admission: Vanc 1750mg  q36h >> 1250mg  q24 hours  Microbiology results: 8/27 BCx: sent 8/27 UCx: sent  Thank you for allowing pharmacy to be a part of this patient's care.  Dimple Nanas, PharmD 04/17/2021 10:58 AM

## 2021-04-17 NOTE — Progress Notes (Addendum)
Subjective: Patient admitted this morning, see detailed H&P by Dr Tonie Griffith  75 year old male with medical history of stage IV carcinoma clinical symptoms noted to right lower lobe lung mass in addition to right hilar lymphadenopathy and liver metastasis diagnosed in April 2018 currently on systemic chemotherapy with Xeloda and Temodar every 4 weeks, tenderness extensive pain diabetes mellitus type 2, history of alcohol abuse for consult: Was presented to the ED from home with fever, chills and confusion.  Patient's wife reported that he has been having low-grade fever nontender 100 at home and then suddenly spiked temperature 102.4.  Also he was confused. In the ED patient was febrile with temperature 100.5, tachycardic with heart rate 105/min,tachypneic with respiratory rate 21-26.  Blood pressure was stable.  Patient is on baseline 3 L: Oxygen.  Chest x-ray negative for infiltrate.  UA negative.  Head CT showed no acute pathology on imaging.  WBC 6.5, lactic acid 2.3, improved to 1.0.  Blood cultures obtained in the ED.  COVID-19 was negative, influenza Aand B were negative.   Vitals:   04/17/21 1424 04/17/21 1704  BP: (!) 121/59 100/87  Pulse: 62 (!) 57  Resp: 16 18  Temp: 97.9 F (36.6 C) 98.1 F (36.7 C)  SpO2: 99% 98%      A/P  SIRS Patient presented SIRS criteria; no clear source of infection identified -Started on empiric antibiotics, vancomycin, cefepime and Flagyl -He is currently afebrile, mental status back to baseline -Follow blood and urine culture results, if negative consider stopping antibiotics next 24 hours  Diabetes mellitus type 2 -Continue sliding scale insulin NovoLog  Hypertension -Continue Norvasc, metoprolol  COPD -Continue Spiriva, albuterol MDI as needed      Oswald Hillock [facility] Pager- [phone]
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
## 2021-04-18 ENCOUNTER — Other Ambulatory Visit (HOSPITAL_COMMUNITY): Payer: Self-pay

## 2021-04-18 DIAGNOSIS — E1159 Type 2 diabetes mellitus with other circulatory complications: Secondary | ICD-10-CM

## 2021-04-18 DIAGNOSIS — I152 Hypertension secondary to endocrine disorders: Secondary | ICD-10-CM

## 2021-04-18 LAB — BASIC METABOLIC PANEL
Anion gap: 7 (ref 5–15)
BUN: 18 mg/dL (ref 8–23)
CO2: 25 mmol/L (ref 22–32)
Calcium: 8.9 mg/dL (ref 8.9–10.3)
Chloride: 107 mmol/L (ref 98–111)
Creatinine, Ser: 1.63 mg/dL — ABNORMAL HIGH (ref 0.61–1.24)
GFR, Estimated: 43 mL/min — ABNORMAL LOW (ref >60)
Glucose, Bld: 129 mg/dL — ABNORMAL HIGH (ref 70–99)
Potassium: 3.8 mmol/L (ref 3.5–5.1)
Sodium: 139 mmol/L (ref 135–145)

## 2021-04-18 LAB — GLUCOSE, CAPILLARY
Glucose-Capillary: 116 mg/dL — ABNORMAL HIGH (ref 70–99)
Glucose-Capillary: 126 mg/dL — ABNORMAL HIGH (ref 70–99)
Glucose-Capillary: 115 mg/dL — ABNORMAL HIGH (ref 70–99)
Glucose-Capillary: 179 mg/dL — ABNORMAL HIGH (ref 70–99)

## 2021-04-18 LAB — URINE CULTURE: Culture: 30000 — AB

## 2021-04-18 LAB — CBC
HCT: 31 % — ABNORMAL LOW (ref 39.0–52.0)
Hemoglobin: 10.6 g/dL — ABNORMAL LOW (ref 13.0–17.0)
MCH: 33.8 pg (ref 26.0–34.0)
MCHC: 34.2 g/dL (ref 30.0–36.0)
MCV: 98.7 fL (ref 80.0–100.0)
Platelets: 152 10*3/uL (ref 150–400)
RBC: 3.14 MIL/uL — ABNORMAL LOW (ref 4.22–5.81)
RDW: 13.9 % (ref 11.5–15.5)
WBC: 8.3 10*3/uL (ref 4.0–10.5)
nRBC: 0 % (ref 0.0–0.2)

## 2021-04-18 MED ORDER — SODIUM CHLORIDE 0.9% FLUSH
10.0000 mL | Freq: Two times a day (BID) | INTRAVENOUS | Status: DC
  Administered 2021-04-18 – 2021-04-21 (×7): 10 mL

## 2021-04-18 MED ORDER — SODIUM CHLORIDE 0.9% FLUSH: 10.0000 mL | INTRAVENOUS | Status: DC | PRN

## 2021-04-18 NOTE — Progress Notes (Signed)
Progress Note    Richard Navarro  WUJ:[id number] DOB: 09/20/1945  DOA: 04/16/2021 PCP: [name], MD    Brief Narrative:    Medical records reviewed and are as summarized below:  Richard Navarro is an 75 y.o. 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 Presents by EMS for evaluation of fever, confusion.   Assessment/Plan:   Principal Problem:   Sepsis ([place]) Active Problems:   Diabetes mellitus with renal manifestation ([place])   Hypertension associated with diabetes ([place])   COPD (chronic obstructive pulmonary disease) ([place])   Primary cancer of right lung (HCC)   Chronic kidney disease (CKD) stage G3b/A1, moderately decreased glomerular filtration rate (GFR) between 30-44 mL/min/1.73 square meter and albuminuria creatinine ratio less than 30 mg/g (HCC)   Prolonged QT interval   Sepsis  -fever to 103 degrees, tachycardia, tachypnea with confusion.  He has history of having sepsis in the past.  Chest x-ray does not show any infiltrate or consolidation and urinalysis is negative.  Blood cultures have been obtained and will be monitored.  No signs of cellulitis.  -cefepime, vancomycin - -COVID negative -check NP swab for resp viruses    Diabetes mellitus with renal manifestation  Glipizide is held secondary to avoid hypoglycemia.   -SSI     Hypertension associated with diabetes  Norvasc and metoprolol.  Monitor blood pressure     COPD (chronic obstructive pulmonary disease)  Use Spiriva and albuterol MDI as needed.     Primary cancer of right lung  Followed by  oncology([place]).  On chemotherapy     Chronic kidney disease (CKD) stage G3b/A1, moderately decreased glomerular filtration rate (GFR) between 30-44 mL/min/1.73 square meter and albuminuria creatinine ratio less than 30 mg/g  -daily labs      Prolonged QT interval Avoid medications or could further prolong QT interval      Family Communication/Anticipated D/C date and plan/Code Status   DVT prophylaxis: heparin Code Status: DNR Disposition Plan: Status is: Inpatient  Remains inpatient appropriate because:Inpatient level of care appropriate due to severity of illness  Dispo: The patient is from: Home              Anticipated d/c is to: Home              Patient currently is not medically stable to d/c.   Difficult to place patient No         Medical Consultants:   none    Subjective:   Feels better  Objective:    Vitals:   04/17/21 1424 04/17/21 1704 04/17/21 2049 04/18/21 0447  BP: (!) 121/59 100/87 (!) 141/86 (!) 163/76  Pulse: 62 (!) 57 67 86  Resp: 16 18 18 18   Temp: 97.9 F (36.6 C) 98.1 F (36.7 C) 98.2 F (36.8 C) 99.9 F (37.7 C)  TempSrc: Oral Oral Oral Oral  SpO2: 99% 98% 93% 93%  Weight:      Height:        Intake/Output Summary (Last 24 hours) at 04/18/2021 1442 Last data filed at 04/18/2021 1200 Gross per 24 hour  Intake 2445.72 ml  Output 700 ml  Net 1745.72 ml   Filed Weights   04/16/21 2133 04/17/21 0451  Weight: 88.5 kg 85.5 kg    Exam:  General: Appearance:    Well developed, well nourished male in no acute distress     Lungs:     Diminished, respirations unlabored  Heart:    Normal heart rate.    MS:   All extremities are intact.    Neurologic:   Pleasant     Data Reviewed:   I have personally reviewed following labs and imaging studies:  Labs: Labs show the following:   Basic Metabolic Panel: Recent Labs  Lab 04/16/21 2144 04/17/21 0817 04/18/21 0233  NA 136 138 139  K 3.7 3.5 3.8  CL 100 109 107   CO2 26 21* 25  GLUCOSE 156* 110* 129*  BUN 22 20 18   CREATININE 1.86* 1.70* 1.63*  CALCIUM 9.3 8.5* 8.9   GFR Estimated Creatinine Clearance: 45.4 mL/min (A) (by C-G formula based on SCr of 1.63 mg/dL (H)). Liver Function Tests: Recent Labs  Lab 04/16/21 2144 04/17/21 0817  AST 20 13*  ALT 13 10  ALKPHOS 67 44  BILITOT 0.7 0.8  PROT 7.8 6.1*  ALBUMIN 3.8 3.1*   No results for input(s): LIPASE, AMYLASE in the last 168 hours. No results for input(s): AMMONIA in the last 168 hours. Coagulation profile Recent Labs  Lab 04/16/21 2144  INR 1.0    CBC: Recent Labs  Lab 04/16/21 2144 04/17/21 0817 04/18/21 0233  WBC 6.5 6.8 8.3  NEUTROABS 5.8  --   --   HGB 11.2* 9.4* 10.6*  HCT 32.6* 27.9* 31.0*  MCV 98.5 100.4* 98.7  PLT 156 131* 152   Cardiac Enzymes: No results for input(s): CKTOTAL, CKMB, CKMBINDEX, TROPONINI in the last 168 hours. BNP (last 3 results) No results for input(s): PROBNP in the last 8760 hours. CBG: Recent Labs  Lab 04/17/21 1158 04/17/21 1700 04/17/21 2051 04/18/21 0730 04/18/21 1157  GLUCAP 127* 126* 142* 116* 126*   D-Dimer: No results for input(s): DDIMER in the last 72 hours. Hgb A1c: Recent Labs    04/17/21 0817  HGBA1C 7.0*   Lipid Profile: No results for input(s): CHOL, HDL, LDLCALC, TRIG, CHOLHDL, LDLDIRECT in the last 72 hours. Thyroid function studies: No results for input(s): TSH, T4TOTAL, T3FREE, THYROIDAB in the last 72 hours.  Invalid input(s): FREET3 Anemia work up: No results for input(s): VITAMINB12, FOLATE, FERRITIN, TIBC, IRON, RETICCTPCT in the last 72 hours. Sepsis Labs: Recent Labs  Lab 04/16/21 2144 04/16/21 2344 04/17/21 0817 04/18/21 0233  WBC 6.5  --  6.8 8.3  LATICACIDVEN 2.3* 1.8 1.0  --     Microbiology Recent Results (from the past 240 hour(s))  Resp Panel by RT-PCR (Flu A&B, Covid) Nasopharyngeal Swab     Status: None   Collection Time: 04/16/21 10:07 PM   Specimen: Nasopharyngeal Swab;  Nasopharyngeal(NP) swabs in vial transport medium  Result Value Ref Range Status   SARS Coronavirus 2 by RT PCR NEGATIVE NEGATIVE Final    Comment: (NOTE) SARS-CoV-2 target nucleic acids are NOT DETECTED.  The SARS-CoV-2 RNA is generally detectable in upper respiratory specimens during the acute phase of infection. The lowest concentration of SARS-CoV-2 viral copies this assay can detect is 138 copies/mL. A negative result does not preclude SARS-Cov-2 infection and should not be used as the sole basis for treatment or other patient management decisions. A negative result may occur with  improper specimen collection/handling, submission of specimen other than  nasopharyngeal swab, presence of viral mutation(s) within the areas targeted by this assay, and inadequate number of viral copies(<138 copies/mL). A negative result must be combined with clinical observations, patient history, and epidemiological information. The expected result is Negative.  Fact Sheet for Patients:  EntrepreneurPulse.com.au  Fact Sheet for Healthcare Providers:  IncredibleEmployment.be  This test is no t yet approved or cleared by the [place] FDA and  has been authorized for detection and/or diagnosis of SARS-CoV-2 by FDA under an Emergency Use Authorization (EUA). This EUA will remain  in effect (meaning this test can be used) for the duration of the COVID-19 declaration under Section 564(b)(1) of the Act, 21 U.S.C.section 360bbb-3(b)(1), unless the authorization is terminated  or revoked sooner.       Influenza A by PCR NEGATIVE NEGATIVE Final   Influenza B by PCR NEGATIVE NEGATIVE Final    Comment: (NOTE) The Xpert Xpress SARS-CoV-2/FLU/RSV plus assay is intended as an aid in the diagnosis of influenza from Nasopharyngeal swab specimens and should not be used as a sole basis for treatment. Nasal washings and aspirates are unacceptable for Xpert Xpress  SARS-CoV-2/FLU/RSV testing.  Fact Sheet for Patients: EntrepreneurPulse.com.au  Fact Sheet for Healthcare Providers: IncredibleEmployment.be  This test is not yet approved or cleared by the [place] FDA and has been authorized for detection and/or diagnosis of SARS-CoV-2 by FDA under an Emergency Use Authorization (EUA). This EUA will remain in effect (meaning this test can be used) for the duration of the COVID-19 declaration under Section 564(b)(1) of the Act, 21 U.S.C. section 360bbb-3(b)(1), unless the authorization is terminated or revoked.  Performed at [facility] [place] [facility] [place] [address], [place], [place] [address]   Urine Culture     Status: Abnormal   Collection Time: 04/16/21 10:07 PM   Specimen: In/Out Cath Urine  Result Value Ref Range Status   Specimen Description   Final    IN/OUT CATH URINE Performed at [facility] [address], [place], [place] [address]    Special Requests   Final    NONE Performed at [facility], [place] [address], [place], [place] [address]    Culture (A)  Final    30,000 COLONIES/mL MULTIPLE SPECIES PRESENT, SUGGEST RECOLLECTION   Report Status 04/18/2021 FINAL  Final    Procedures and diagnostic studies:  CT HEAD WO CONTRAST (5MM)  Result Date: 04/17/2021 CLINICAL DATA:  History of lung cancer and renal cancer presenting with fever and weakness. EXAM: CT HEAD WITHOUT CONTRAST TECHNIQUE: Contiguous axial images were obtained from the base of the skull through the vertex without intravenous contrast. COMPARISON:  Dec 19, 2016 FINDINGS: Brain: There is mild cerebral atrophy with widening of the extra-axial spaces and ventricular dilatation. There are areas of decreased attenuation within the white matter tracts of the supratentorial brain, consistent with microvascular disease changes. Vascular: No hyperdense vessel or unexpected calcification. Skull:  Normal. Negative for fracture or focal lesion. Sinuses/Orbits: No acute finding. Other: None. IMPRESSION: 1. Generalized cerebral atrophy. 2. No acute intracranial abnormality. Electronically Signed   By: Virgina Norfolk M.D.   On: 04/17/2021 00:04   DG Chest Port 1 View  Result Date: 04/16/2021 CLINICAL DATA:  Questionable sepsis. Fever. Weakness. Pt has cancer pt receiving chemo. Pt has lung and renal cancer. H/o Diabetes, HTN, Emphysema. EXAM: PORTABLE CHEST 1 VIEW COMPARISON:  Chest x-ray 01/26/2020, CT chest 08/30/2020, CT chest 12/03/2020 FINDINGS: The heart and mediastinal contours are within normal limits. Fullness of  the right inferior hilar region consistent with known mass. Right base interstitial markings. No pulmonary edema. Persistent trace right pleural effusion no left pleural effusion. No pneumothorax. No acute osseous abnormality. IMPRESSION: Right base interstitial markings as well as paramediastinal/perihilar right opacity consistent with known mass. Persistent trace right pleural effusion. Electronically Signed   By: Iven Finn M.D.   On: 04/16/2021 22:54    Medications:    amLODipine  10 mg Oral Daily   calcium carbonate  2,500 mg Oral BID   cholecalciferol  1,000 Units Oral Daily   ferrous sulfate  325 mg Oral QODAY   heparin  5,000 Units Subcutaneous Q8H   insulin aspart  0-5 Units Subcutaneous QHS   insulin aspart  0-9 Units Subcutaneous TID WC   metoprolol tartrate  25 mg Oral BID   pantoprazole  40 mg Oral Daily   sodium chloride flush  10-40 mL Intracatheter Q12H   umeclidinium bromide  1 puff Inhalation Daily   Continuous Infusions:  ceFEPime (MAXIPIME) IV 2 g (04/18/21 0913)   lactated ringers 100 mL/hr at 04/18/21 1407   vancomycin 166.7 mL/hr at 04/17/21 2247     LOS: 2 days   Geradine Girt  [facility]   How to contact the [facility] Attending or Consulting provider [place] or covering provider during after hours [place], for this patient?   Check the care team in [facility] and look for a) attending/consulting [facility] provider listed and b) the [facility] team listed Log into [email or web address] and use [place]'s universal password to access. If you do not have the password, please contact the hospital operator. Locate the [facility] provider you are looking for under [facility] and page to a number that you can be directly reached. If you still have difficulty reaching the provider, please page the [facility] (Director on Call) for the Hospitalists listed on amion for assistance.  04/18/2021, 2:42 PM

## 2021-04-19 ENCOUNTER — Inpatient Hospital Stay (HOSPITAL_COMMUNITY): Payer: Medicare Other

## 2021-04-19 ENCOUNTER — Other Ambulatory Visit (HOSPITAL_COMMUNITY): Payer: Self-pay

## 2021-04-19 DIAGNOSIS — C3491 Malignant neoplasm of unspecified part of right bronchus or lung: Secondary | ICD-10-CM

## 2021-04-19 LAB — RESPIRATORY PANEL BY PCR

## 2021-04-19 LAB — GLUCOSE, CAPILLARY
Glucose-Capillary: 100 mg/dL — ABNORMAL HIGH (ref 70–99)
Glucose-Capillary: 171 mg/dL — ABNORMAL HIGH (ref 70–99)
Glucose-Capillary: 121 mg/dL — ABNORMAL HIGH (ref 70–99)
Glucose-Capillary: 159 mg/dL — ABNORMAL HIGH (ref 70–99)

## 2021-04-19 MED ORDER — PANTOPRAZOLE SODIUM 40 MG PO TBEC
40.0000 mg | DELAYED_RELEASE_TABLET | Freq: Two times a day (BID) | ORAL | Status: DC
  Administered 2021-04-19 – 2021-04-22 (×6): 40 mg via ORAL
  Filled 2021-04-19 (×6): qty 1

## 2021-04-19 NOTE — Evaluation (Signed)
Physical Therapy Evaluation Patient Details Name: Richard Navarro MRN: [id number] DOB: 1946-01-01 Today's Date: 04/19/2021   History of Present Illness  75 y.o. male admitted 04/16/21 for sepsis.  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  Pt currently with functional limitations due to the deficits listed below (see PT Problem List). Pt will benefit from skilled PT to increase their independence and safety with mobility to allow discharge to the venue listed below.  Pt ambulated good distance in hallway and does require supplemental oxygen.  Pt reports going to OPPT prior to admission.    SATURATION QUALIFICATIONS: (This note is used to comply with regulatory documentation for home oxygen)  Patient Saturations on Room Air at Rest = 92%  Patient Saturations on Room Air while Ambulating = 68%  Patient Saturations on 2 Liters of oxygen while Ambulating = 82%  (increased to 3L however pulse ox with poor pleth)  Please briefly explain why patient needs home oxygen: to improve oxygen saturations during physical activities     Follow Up Recommendations Outpatient PT;Supervision for mobility/OOB    Equipment Recommendations  None recommended by PT    Recommendations for Other Services       Precautions / Restrictions Precautions Precautions: Fall Precaution Comments: monitor sats      Mobility  Bed Mobility               General bed mobility comments: pt in recliner    Transfers Overall transfer level: Needs assistance Equipment used: Rolling walker (2  wheeled) Transfers: Sit to/from Stand Sit to Stand: Min guard         General transfer comment: min/guard for safety  Ambulation/Gait Ambulation/Gait assistance: Min guard Gait Distance (Feet): 300 Feet Assistive device: None Gait Pattern/deviations: Step-through pattern;Decreased stride length     General Gait Details: mildly unsteady initially however improved with distance (pt hasn't ambulated in hallway since admission), SpO2 dropped to 69% on room air so reapplied O2 [place]  Stairs            Wheelchair Mobility    Modified Rankin (Stroke Patients Only)       Balance Overall balance assessment: Needs assistance;History of Falls         Standing balance support: No upper extremity supported Standing balance-Leahy Scale: Fair                               Pertinent Vitals/Pain Pain Assessment: No/denies pain    Home Living Family/patient expects to be discharged to:: Private residence Living Arrangements: Spouse/significant other   Type of Home: House       Home Layout: Able to live on main level with bedroom/bathroom Home Equipment: None      Prior Function Level of Independence: Independent               Hand Dominance        Extremity/Trunk Assessment        Lower Extremity Assessment Lower Extremity Assessment: Overall WFL for tasks assessed    Cervical / Trunk Assessment Cervical / Trunk Assessment: Normal  Communication   Communication: No difficulties  Cognition Arousal/Alertness: Awake/alert Behavior During Therapy: WFL for tasks assessed/performed Overall Cognitive Status: Within Functional Limits for tasks assessed                                 General Comments: ?cognition as pt states he doesn't wear oxygen at home, per chart review and RN, pt baseline 3L O2 [place]      General Comments General comments (skin integrity, edema, etc.): pt reports falling at home due to drinking cold  water??    Exercises     Assessment/Plan    PT Assessment Patient needs continued PT services  PT Problem List Decreased strength;Decreased mobility;Cardiopulmonary status limiting activity;Decreased knowledge of use of DME       PT Treatment Interventions Gait training;DME instruction;Therapeutic exercise;Balance training;Functional mobility training;Therapeutic activities;Patient/family education    PT Goals (Current goals can be found in the Care Plan section)  Acute Rehab PT Goals PT Goal Formulation: With patient Time For Goal Achievement: 04/26/21 Potential to Achieve Goals: Good    Frequency Min 3X/week   Barriers to discharge        Co-evaluation               AM-PAC PT "6 Clicks" Mobility  Outcome Measure Help needed turning from your back to your side while in a flat bed without using bedrails?: A Little Help needed moving from lying on your back to sitting on the side of a flat bed without using bedrails?: A Little Help needed moving to and from a bed to a chair (including a wheelchair)?: A Little Help needed standing up from a chair using your arms (e.g., wheelchair or bedside chair)?: A Little Help needed to walk in hospital room?: A Little Help needed climbing 3-5 steps with a railing? : A Little 6 Click Score: 18    End of Session Equipment Utilized During Treatment: Gait belt;Oxygen Activity Tolerance: Patient tolerated treatment well Patient left: with call bell/phone within reach;in chair;with chair alarm set Nurse Communication: Mobility status PT Visit Diagnosis: Difficulty in walking, not elsewhere classified (R26.2)    Time: 1194-1740 PT Time Calculation (min) (ACUTE ONLY): 41 min   Charges:   PT Evaluation $PT Eval Low Complexity: 1 Low PT Treatments $Gait Training: 8-22 mins       Jannette Spanner PT, DPT Acute Rehabilitation Services Pager: [phone] Office: [phone]   Trena Platt 04/19/2021, 4:49 PM

## 2021-04-19 NOTE — Progress Notes (Signed)
Progress Note    Richard Navarro  LYY:[id number] DOB: Sep 09, 1945  DOA: 04/16/2021 PCP: [name], MD    Brief Narrative:    Medical records reviewed and are as summarized below:  Richard Navarro is an 75 y.o. 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 Presents by EMS for evaluation of fever, confusion.  Appears to be PNA-- ? Aspiration-- DG esophagus pending   Assessment/Plan:   Principal Problem:   Sepsis ([place]) Active Problems:   Diabetes mellitus with renal manifestation ([place])   Hypertension associated with diabetes ([place])   COPD (chronic obstructive pulmonary disease) ([place])   Primary cancer of right lung (HCC)   Chronic kidney disease (CKD) stage G3b/A1, moderately decreased glomerular filtration rate (GFR) between 30-44 mL/min/1.73 square meter and albuminuria creatinine ratio less than 30 mg/g (HCC)   Prolonged QT interval   Sepsis  -fever to 103 degrees, tachycardia, tachypnea with confusion.  He has history of having sepsis in the past.  Chest x-ray does not show any infiltrate or consolidation and urinalysis is negative.  Blood cultures have been obtained and will be monitored.   -cefepime, vancomycin - -COVID negative/NP swab negative -? Aspiration as has had swallowing issues-- DG esophagus pending -PT eval    Diabetes mellitus with renal manifestation  Glipizide is held secondary to avoid hypoglycemia.   -SSI     Hypertension associated with diabetes  Norvasc and metoprolol.  Monitor blood pressure     COPD (chronic obstructive pulmonary disease)  Use  Spiriva and albuterol MDI as needed.     Primary cancer of right lung  Followed by oncology([facility]).  On chemotherapy     Chronic kidney disease (CKD) stage G3b/A1, moderately decreased glomerular filtration rate (GFR) between 30-44 mL/min/1.73 square meter and albuminuria creatinine ratio less than 30 mg/g  -daily labs      Prolonged QT interval Avoid medications or could further prolong QT interval      Family Communication/Anticipated D/C date and plan/Code Status   DVT prophylaxis: heparin Code Status: DNR Disposition Plan: Status is: Inpatient  Remains inpatient appropriate because:Inpatient level of care appropriate due to severity of illness  Dispo: The patient is from: Home              Anticipated d/c is to: Home              Patient currently is not medically stable to d/c.   Difficult to place patient No         Medical Consultants:   none    Subjective:   Says he has trouble swallowing sometimes  Objective:    Vitals:   04/18/21 1444 04/18/21 2117 04/19/21 0919 04/19/21 1255  BP: (!) 113/57 130/73  (!) 141/116  Pulse: 74 78  94  Resp: 16 18  16   Temp: 98 F (36.7 C) 99.3 F (37.4 C)  98.2 F (36.8 C)  TempSrc: Oral Oral  Oral  SpO2: 98% 96% 92% 100%  Weight:      Height:        Intake/Output Summary (Last 24 hours) at 04/19/2021 1410 Last data filed at 04/19/2021 1100  Gross per 24 hour  Intake 477.43 ml  Output 850 ml  Net -372.57 ml   Filed Weights   04/16/21 2133 04/17/21 0451  Weight: 88.5 kg 85.5 kg    Exam:   General: Appearance:    Elderly male in no acute distress     Lungs:     Diminished, respirations unlabored  Heart:    Normal heart rate.    MS:   All extremities are intact.    Neurologic:   Awake, alert, oriented x 3       Data Reviewed:   I have personally reviewed following labs and imaging studies:  Labs: Labs show the following:   Basic Metabolic Panel: Recent Labs  Lab 04/16/21 2144  04/17/21 0817 04/18/21 0233  NA 136 138 139  K 3.7 3.5 3.8  CL 100 109 107  CO2 26 21* 25  GLUCOSE 156* 110* 129*  BUN 22 20 18   CREATININE 1.86* 1.70* 1.63*  CALCIUM 9.3 8.5* 8.9   GFR Estimated Creatinine Clearance: 45.4 mL/min (A) (by C-G formula based on SCr of 1.63 mg/dL (H)). Liver Function Tests: Recent Labs  Lab 04/16/21 2144 04/17/21 0817  AST 20 13*  ALT 13 10  ALKPHOS 67 44  BILITOT 0.7 0.8  PROT 7.8 6.1*  ALBUMIN 3.8 3.1*   No results for input(s): LIPASE, AMYLASE in the last 168 hours. No results for input(s): AMMONIA in the last 168 hours. Coagulation profile Recent Labs  Lab 04/16/21 2144  INR 1.0    CBC: Recent Labs  Lab 04/16/21 2144 04/17/21 0817 04/18/21 0233  WBC 6.5 6.8 8.3  NEUTROABS 5.8  --   --   HGB 11.2* 9.4* 10.6*  HCT 32.6* 27.9* 31.0*  MCV 98.5 100.4* 98.7  PLT 156 131* 152   Cardiac Enzymes: No results for input(s): CKTOTAL, CKMB, CKMBINDEX, TROPONINI in the last 168 hours. BNP (last 3 results) No results for input(s): PROBNP in the last 8760 hours. CBG: Recent Labs  Lab 04/18/21 1157 04/18/21 1754 04/18/21 2113 04/19/21 0747 04/19/21 1128  GLUCAP 126* 115* 179* 100* 171*   D-Dimer: No results for input(s): DDIMER in the last 72 hours. Hgb A1c: Recent Labs    04/17/21 0817  HGBA1C 7.0*   Lipid Profile: No results for input(s): CHOL, HDL, LDLCALC, TRIG, CHOLHDL, LDLDIRECT in the last 72 hours. Thyroid function studies: No results for input(s): TSH, T4TOTAL, T3FREE, THYROIDAB in the last 72 hours.  Invalid input(s): FREET3 Anemia work up: No results for input(s): VITAMINB12, FOLATE, FERRITIN, TIBC, IRON, RETICCTPCT in the last 72 hours. Sepsis Labs: Recent Labs  Lab 04/16/21 2144 04/16/21 2344 04/17/21 0817 04/18/21 0233  WBC 6.5  --  6.8 8.3  LATICACIDVEN 2.3* 1.8 1.0  --     Microbiology Recent Results (from the past 240 hour(s))  Blood Culture (routine x 2)     Status: None (Preliminary result)    Collection Time: 04/16/21  9:44 PM   Specimen: BLOOD  Result Value Ref Range Status   Specimen Description   Final    BLOOD RIGHT ANTECUBITAL Performed at [facility], [place] [address], [place], [place] [address]    Special Requests   Final    BOTTLES DRAWN AEROBIC AND ANAEROBIC Blood Culture adequate volume Performed at [place] [address] [place], [place] [address]    Culture   Final    NO GROWTH 2 DAYS Performed at [place],  [place] [address]    Report Status PENDING  Incomplete  Blood Culture (routine x 2)     Status: None (Preliminary result)   Collection Time: 04/16/21  9:45 PM   Specimen: BLOOD  Result Value Ref Range Status   Specimen Description   Final    BLOOD BLOOD RIGHT HAND Performed at [facility] [address], [place], [place] [address]    Special Requests   Final    BOTTLES DRAWN AEROBIC AND ANAEROBIC Blood Culture results may not be optimal due to an inadequate volume of blood received in culture bottles Performed at [facility] [address], [place], [place] [address]    Culture   Final    NO GROWTH 2 DAYS Performed at [facility] [address], [place], [place] [address]    Report Status PENDING  Incomplete  Resp Panel by RT-PCR (Flu A&B, Covid) Nasopharyngeal Swab     Status: None   Collection Time: 04/16/21 10:07 PM   Specimen: Nasopharyngeal Swab; Nasopharyngeal(NP) swabs in vial transport medium  Result Value Ref Range Status   SARS Coronavirus 2 by RT PCR NEGATIVE NEGATIVE Final    Comment: (NOTE) SARS-CoV-2 target nucleic acids are NOT DETECTED.  The SARS-CoV-2 RNA is generally detectable in upper respiratory specimens during the acute phase of infection. The lowest concentration of SARS-CoV-2 viral copies this assay can detect is 138 copies/mL. A negative result does not preclude SARS-Cov-2 infection and  should not be used as the sole basis for treatment or other patient management decisions. A negative result may occur with  improper specimen collection/handling, submission of specimen other than nasopharyngeal swab, presence of viral mutation(s) within the areas targeted by this assay, and inadequate number of viral copies(<138 copies/mL). A negative result must be combined with clinical observations, patient history, and epidemiological information. The expected result is Negative.  Fact Sheet for Patients:  EntrepreneurPulse.com.au  Fact Sheet for Healthcare Providers:  IncredibleEmployment.be  This test is no t yet approved or cleared by the [place] FDA and  has been authorized for detection and/or diagnosis of SARS-CoV-2 by FDA under an Emergency Use Authorization (EUA). This EUA will remain  in effect (meaning this test can be used) for the duration of the COVID-19 declaration under Section 564(b)(1) of the Act, 21 U.S.C.section 360bbb-3(b)(1), unless the authorization is terminated  or revoked sooner.       Influenza A by PCR NEGATIVE NEGATIVE Final   Influenza B by PCR NEGATIVE NEGATIVE Final    Comment: (NOTE) The Xpert Xpress SARS-CoV-2/FLU/RSV plus assay is intended as an aid in the diagnosis of influenza from Nasopharyngeal swab specimens and should not be used as a sole basis for treatment. Nasal washings and aspirates are unacceptable for Xpert Xpress SARS-CoV-2/FLU/RSV testing.  Fact Sheet for Patients: EntrepreneurPulse.com.au  Fact Sheet for Healthcare Providers: IncredibleEmployment.be  This test is not yet approved or cleared by the [place] FDA and has been authorized for detection and/or diagnosis of SARS-CoV-2 by FDA under an Emergency Use Authorization (EUA). This EUA will remain in effect (meaning this test can be used) for the duration of the COVID-19 declaration  under Section 564(b)(1) of the Act, 21 U.S.C. section 360bbb-3(b)(1), unless the authorization is terminated or revoked.  Performed at [facility], [place] [address] [place], [place] [address]   Urine Culture     Status: Abnormal   Collection Time: 04/16/21 10:07 PM   Specimen: In/Out Cath Urine  Result Value Ref Range Status   Specimen Description   Final    IN/OUT CATH URINE Performed at [facility] [address] [place], [place] [address]    Special Requests   Final    NONE Performed at [facility], [place] [address] [place], [place] [address]    Culture (A)  Final    30,000 COLONIES/mL MULTIPLE SPECIES PRESENT, SUGGEST RECOLLECTION   Report Status 04/18/2021 FINAL  Final  Respiratory (~20 pathogens) panel by PCR     Status: None   Collection Time: 04/18/21  6:17 PM   Specimen: Nasopharyngeal Swab; Respiratory  Result Value Ref Range Status   Adenovirus NOT DETECTED NOT DETECTED Final   Coronavirus 229E NOT DETECTED NOT DETECTED Final    Comment: (NOTE) The Coronavirus on the Respiratory Panel, DOES NOT test for the novel  Coronavirus (2019 nCoV)    Coronavirus HKU1 NOT DETECTED NOT DETECTED Final   Coronavirus NL63 NOT DETECTED NOT DETECTED Final   Coronavirus OC43 NOT DETECTED NOT DETECTED Final   Metapneumovirus NOT DETECTED NOT DETECTED Final   Rhinovirus / Enterovirus NOT DETECTED NOT DETECTED Final   Influenza A NOT DETECTED NOT DETECTED Final   Influenza B NOT DETECTED NOT DETECTED Final   Parainfluenza Virus 1 NOT DETECTED NOT DETECTED Final   Parainfluenza Virus 2 NOT DETECTED NOT DETECTED Final   Parainfluenza Virus 3 NOT DETECTED NOT DETECTED Final   Parainfluenza Virus 4 NOT DETECTED NOT DETECTED Final   Respiratory Syncytial Virus NOT DETECTED NOT DETECTED Final   Bordetella pertussis NOT DETECTED NOT DETECTED Final   Bordetella Parapertussis NOT DETECTED NOT DETECTED Final   Chlamydophila  pneumoniae NOT DETECTED NOT DETECTED Final   Mycoplasma pneumoniae NOT DETECTED NOT DETECTED Final    Comment: Performed at [facility], [place] [address], [place],  [address]    Procedures and diagnostic studies:  No results found.  Medications:    amLODipine  10 mg Oral Daily   calcium carbonate  2,500 mg Oral BID   cholecalciferol  1,000 Units Oral Daily   ferrous sulfate  325 mg Oral QODAY   heparin  5,000 Units Subcutaneous Q8H   insulin aspart  0-5 Units Subcutaneous QHS   insulin aspart  0-9 Units Subcutaneous TID WC   metoprolol tartrate  25 mg Oral BID   pantoprazole  40 mg Oral Daily   sodium chloride flush  10-40 mL Intracatheter Q12H   umeclidinium bromide  1 puff Inhalation Daily   Continuous Infusions:  ceFEPime (MAXIPIME) IV 2 g (04/19/21 0926)     LOS: 3 days   Geradine Girt  [facility]   How to contact the [facility] Attending or Consulting provider [place] or covering provider during after hours [place], for this patient?  Check the care team in [facility] and look for a) attending/consulting [facility] provider listed and b) the [facility] team listed Log into [email or web address] and use [place]'s universal password to access. If you do not have the password, please contact the hospital operator. Locate the [facility] provider you are looking for under [facility] and page to a number that you can be directly reached. If you still have difficulty reaching the provider, please page the [facility] (Director on Call) for the Hospitalists listed on amion for assistance.  04/19/2021, 2:10 PM
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
## 2021-04-20 ENCOUNTER — Inpatient Hospital Stay (HOSPITAL_COMMUNITY): Payer: Medicare Other

## 2021-04-20 ENCOUNTER — Other Ambulatory Visit (HOSPITAL_COMMUNITY): Payer: Self-pay

## 2021-04-20 DIAGNOSIS — R0609 Other forms of dyspnea: Secondary | ICD-10-CM | POA: Diagnosis not present

## 2021-04-20 LAB — CBC WITH DIFFERENTIAL/PLATELET
Abs Immature Granulocytes: 0.02 10*3/uL (ref 0.00–0.07)
Basophils Absolute: 0 10*3/uL (ref 0.0–0.1)
Basophils Relative: 0 %
Eosinophils Absolute: 0.2 10*3/uL (ref 0.0–0.5)
Eosinophils Relative: 6 %
HCT: 23 % — ABNORMAL LOW (ref 39.0–52.0)
Hemoglobin: 7.8 g/dL — ABNORMAL LOW (ref 13.0–17.0)
Immature Granulocytes: 1 %
Lymphocytes Relative: 11 %
Lymphs Abs: 0.4 10*3/uL — ABNORMAL LOW (ref 0.7–4.0)
MCH: 33.8 pg (ref 26.0–34.0)
MCHC: 33.9 g/dL (ref 30.0–36.0)
MCV: 99.6 fL (ref 80.0–100.0)
Monocytes Absolute: 0.4 10*3/uL (ref 0.1–1.0)
Monocytes Relative: 11 %
Neutro Abs: 2.2 10*3/uL (ref 1.7–7.7)
Neutrophils Relative %: 71 %
Platelets: 125 10*3/uL — ABNORMAL LOW (ref 150–400)
RBC: 2.31 MIL/uL — ABNORMAL LOW (ref 4.22–5.81)
RDW: 14.1 % (ref 11.5–15.5)
WBC: 3.2 10*3/uL — ABNORMAL LOW (ref 4.0–10.5)
nRBC: 0 % (ref 0.0–0.2)

## 2021-04-20 LAB — ECHOCARDIOGRAM COMPLETE
AR max vel: 4.45 cm2
AV Area VTI: 3.77 cm2
AV Area mean vel: 3.71 cm2
AV Mean grad: 4 mmHg
AV Peak grad: 5.9 mmHg
Ao pk vel: 1.21 m/s
Area-P 1/2: 3.67 cm2
Height: 75 in
S' Lateral: 2.3 cm
Single Plane A4C EF: 58.2 %
Weight: 3015.89 oz

## 2021-04-20 LAB — BASIC METABOLIC PANEL
Anion gap: 7 (ref 5–15)
BUN: 17 mg/dL (ref 8–23)
CO2: 23 mmol/L (ref 22–32)
Calcium: 8.9 mg/dL (ref 8.9–10.3)
Chloride: 112 mmol/L — ABNORMAL HIGH (ref 98–111)
Creatinine, Ser: 1.44 mg/dL — ABNORMAL HIGH (ref 0.61–1.24)
GFR, Estimated: 50 mL/min — ABNORMAL LOW (ref >60)
Glucose, Bld: 112 mg/dL — ABNORMAL HIGH (ref 70–99)
Potassium: 3.6 mmol/L (ref 3.5–5.1)
Sodium: 142 mmol/L (ref 135–145)

## 2021-04-20 LAB — GLUCOSE, CAPILLARY
Glucose-Capillary: 97 mg/dL (ref 70–99)
Glucose-Capillary: 154 mg/dL — ABNORMAL HIGH (ref 70–99)
Glucose-Capillary: 151 mg/dL — ABNORMAL HIGH (ref 70–99)
Glucose-Capillary: 154 mg/dL — ABNORMAL HIGH (ref 70–99)

## 2021-04-20 MED ORDER — AMOXICILLIN-POT CLAVULANATE 875-125 MG PO TABS
1.0000 | ORAL_TABLET | Freq: Two times a day (BID) | ORAL | Status: DC
  Administered 2021-04-20 – 2021-04-22 (×4): 1 via ORAL
  Filled 2021-04-20 (×4): qty 1

## 2021-04-20 NOTE — Progress Notes (Signed)
Progress Note    Richard Navarro  NOI:[id number] DOB: 1946/07/13  DOA: 04/16/2021 PCP: [name], MD    Brief Narrative:    Medical records reviewed and are as summarized below:  Richard Navarro is an 75 y.o. 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 Presents by EMS for evaluation of fever, confusion.  Appears to be PNA-- ? Aspiration.     Assessment/Plan:   Principal Problem:   Sepsis ([place]) Active Problems:   Diabetes mellitus with renal manifestation ([place])   Hypertension associated with diabetes ([place])   COPD (chronic obstructive pulmonary disease) ([place])   Primary cancer of right lung (HCC)   Chronic kidney disease (CKD) stage G3b/A1, moderately decreased glomerular filtration rate (GFR) between 30-44 mL/min/1.73 square meter and albuminuria creatinine ratio less than 30 mg/g (HCC)   Prolonged QT interval   Acute respiratory failure -does NOT wear O2 at home -wean -suspect he will need to go home on O2 -check echo  Sepsis  -fever to 103 degrees, tachycardia, tachypnea with confusion.  He has history of having sepsis in the past.  Chest x-ray does not show any infiltrate or consolidation and urinalysis is negative.  Blood cultures have been obtained and will be monitored.   -cefepime, vancomycin - narrow to Augmentin and monitor -COVID negative/NP swab negative -? Aspiration as has had swallowing issues-- DG esophagus:   At the junction of the cervical and thoracic segments of the esophagus, there is some focal smooth narrowing as on image 19 series  3, with a small persistent contrast collection in this vicinity on several swallows potentially representing a traction diverticulum or small ulceration. I have scrutinized this region on The prior CT of 12/03/2020 but I do not see a well-defined cause for This appearance on that study. The possibility of a somewhat high in position peptic stricture is raised. This seems higher in position than would be expected for the patient's prior radiation therapy. 2. Distal esophageal fold thickening raises suspicion for esophagitis. 3. Disruption of primary peristaltic waves in the distal esophagus although there were propulsive secondary contractions and some tertiary contractions in the distal esophagus which were successful clearing the boluses. 4. Moderate-sized type 1 hiatal hernia resulting in some exaggerated curvature of the distal esophagus.  -discussed with patient lifestyle modification, will defer EGD for now due to respiratory status SLP eval :Remain upright for at least 30 minutes after po intake;Seated upright at 90 degrees  -PT eval    Diabetes mellitus with renal manifestation  Glipizide is held secondary to avoid hypoglycemia.   -SSI     Hypertension associated with diabetes  Norvasc and metoprolol.  Monitor blood pressure     COPD (chronic obstructive pulmonary disease)  Use Spiriva and albuterol MDI as needed.     Primary cancer of right lung  Followed by oncology([facility]).  On chemotherapy     Chronic kidney disease (CKD) stage G3b/A1, moderately decreased glomerular filtration rate (GFR) between 30-44 mL/min/1.73 square meter and albuminuria creatinine ratio less than 30 mg/g  -daily labs      Prolonged QT interval Avoid medications or could  further prolong QT interval   Hiatal hernia -diet changes -aspiration precautions    Family Communication/Anticipated D/C date and plan/Code Status   DVT prophylaxis: heparin Code Status: DNR Disposition Plan: Status  is: Inpatient  Remains inpatient appropriate because:Inpatient level of care appropriate due to severity of illness  Dispo: The patient is from: Home              Anticipated d/c is to: Home              Patient currently is not medically stable to d/c.   Difficult to place patient No         Medical Consultants:   none    Subjective:   Says he does not wear O2 at home  Objective:    Vitals:   04/19/21 1657 04/19/21 2204 04/20/21 0534 04/20/21 0758  BP: 134/65 131/70 134/88   Pulse: 61 64 69   Resp: 18 20 20    Temp: 98.1 F (36.7 C) 97.8 F (36.6 C) 98.5 F (36.9 C)   TempSrc: Oral Oral Oral   SpO2: 100% 99% 100% 99%  Weight:      Height:        Intake/Output Summary (Last 24 hours) at 04/20/2021 1215 Last data filed at 04/20/2021 6834 Gross per 24 hour  Intake 130 ml  Output 1450 ml  Net -1320 ml   Filed Weights   04/16/21 2133 04/17/21 0451  Weight: 88.5 kg 85.5 kg    Exam:   General: Appearance:    Well developed, well nourished male in no acute distress     Lungs:     Diminished, on 3L [place], respirations unlabored  Heart:    Normal heart rate.    MS:   All extremities are intact.    Neurologic:   Awake, alert         Data Reviewed:   I have personally reviewed following labs and imaging studies:  Labs: Labs show the following:   Basic Metabolic Panel: Recent Labs  Lab 04/16/21 2144 04/17/21 0817 04/18/21 0233 04/20/21 0406  NA 136 138 139 142  K 3.7 3.5 3.8 3.6  CL 100 109 107 112*  CO2 26 21* 25 23  GLUCOSE 156* 110* 129* 112*  BUN 22 20 18 17   CREATININE 1.86* 1.70* 1.63* 1.44*  CALCIUM 9.3 8.5* 8.9 8.9   GFR Estimated Creatinine Clearance: 51.3 mL/min (A) (by C-G formula based on SCr of 1.44 mg/dL (H)). Liver Function Tests: Recent Labs  Lab 04/16/21 2144 04/17/21 0817  AST 20 13*  ALT 13 10  ALKPHOS 67 44  BILITOT 0.7 0.8  PROT 7.8 6.1*  ALBUMIN 3.8 3.1*   No results for input(s): LIPASE, AMYLASE in the  last 168 hours. No results for input(s): AMMONIA in the last 168 hours. Coagulation profile Recent Labs  Lab 04/16/21 2144  INR 1.0    CBC: Recent Labs  Lab 04/16/21 2144 04/17/21 0817 04/18/21 0233 04/20/21 0406  WBC 6.5 6.8 8.3 3.2*  NEUTROABS 5.8  --   --  2.2  HGB 11.2* 9.4* 10.6* 7.8*  HCT 32.6* 27.9* 31.0* 23.0*  MCV 98.5 100.4* 98.7 99.6  PLT 156 131* 152 125*   Cardiac Enzymes: No results for input(s): CKTOTAL, CKMB, CKMBINDEX, TROPONINI in the last 168 hours. BNP (last 3 results) No results for input(s): PROBNP in the last 8760 hours. CBG: Recent Labs  Lab 04/19/21 0747 04/19/21 1128 04/19/21 1635 04/19/21 2201 04/20/21 0721  GLUCAP 100* 171*  121* 159* 97   D-Dimer: No results for input(s): DDIMER in the last 72 hours. Hgb A1c: No results for input(s): HGBA1C in the last 72 hours.  Lipid Profile: No results for input(s): CHOL, HDL, LDLCALC, TRIG, CHOLHDL, LDLDIRECT in the last 72 hours. Thyroid function studies: No results for input(s): TSH, T4TOTAL, T3FREE, THYROIDAB in the last 72 hours.  Invalid input(s): FREET3 Anemia work up: No results for input(s): VITAMINB12, FOLATE, FERRITIN, TIBC, IRON, RETICCTPCT in the last 72 hours. Sepsis Labs: Recent Labs  Lab 04/16/21 2144 04/16/21 2344 04/17/21 0817 04/18/21 0233 04/20/21 0406  WBC 6.5  --  6.8 8.3 3.2*  LATICACIDVEN 2.3* 1.8 1.0  --   --     Microbiology Recent Results (from the past 240 hour(s))  Blood Culture (routine x 2)     Status: None (Preliminary result)   Collection Time: 04/16/21  9:44 PM   Specimen: BLOOD  Result Value Ref Range Status   Specimen Description   Final    BLOOD RIGHT ANTECUBITAL Performed at [facility] [address], [place], [place] [address]    Special Requests   Final    BOTTLES DRAWN AEROBIC AND ANAEROBIC Blood Culture adequate volume Performed at [facility] [address] [place], [place] [address]     Culture   Final    NO GROWTH 3 DAYS Performed at [facility], [place] [address] [place], [place] [address]    Report Status PENDING  Incomplete  Blood Culture (routine x 2)     Status: None (Preliminary result)   Collection Time: 04/16/21  9:45 PM   Specimen: BLOOD  Result Value Ref Range Status   Specimen Description   Final    BLOOD BLOOD RIGHT HAND Performed at [facility] [address], [place], [place] [address]    Special Requests   Final    BOTTLES DRAWN AEROBIC AND ANAEROBIC Blood Culture results may not be optimal due to an inadequate volume of blood received in culture bottles Performed at [facility] [address], [place], [place] [address]    Culture   Final    NO GROWTH 3 DAYS Performed at [facility], [place] [address], [place],  [address]    Report Status PENDING  Incomplete  Resp Panel by RT-PCR (Flu A&B, Covid) Nasopharyngeal Swab     Status: None   Collection Time: 04/16/21 10:07 PM   Specimen: Nasopharyngeal Swab; Nasopharyngeal(NP) swabs in vial transport medium  Result Value Ref Range Status   SARS Coronavirus 2 by RT PCR NEGATIVE NEGATIVE Final    Comment: (NOTE) SARS-CoV-2 target nucleic acids are NOT DETECTED.  The SARS-CoV-2 RNA is generally detectable in upper respiratory specimens during the acute phase of infection. The lowest concentration of SARS-CoV-2 viral copies this assay can detect is 138 copies/mL. A negative result does not preclude SARS-Cov-2 infection and should not be used as the sole basis for treatment or other patient management decisions. A negative result may occur with  improper specimen collection/handling, submission of specimen other than nasopharyngeal swab, presence of viral mutation(s) within the areas targeted by this assay, and inadequate number of viral copies(<138 copies/mL). A negative result must be combined with clinical observations, patient history, and  epidemiological information. The expected result is Negative.  Fact Sheet for Patients:  EntrepreneurPulse.com.au  Fact Sheet for Healthcare Providers:  IncredibleEmployment.be  This test is no t yet approved or cleared by the [place]  States FDA and  has been authorized for detection and/or diagnosis of SARS-CoV-2 by FDA under an Emergency Use Authorization (EUA). This EUA will remain  in effect (meaning this test can be used) for the duration of the COVID-19 declaration under Section 564(b)(1) of the Act, 21 U.S.C.section 360bbb-3(b)(1), unless the authorization is terminated  or revoked sooner.       Influenza A by PCR NEGATIVE NEGATIVE Final   Influenza B by PCR NEGATIVE NEGATIVE Final    Comment: (NOTE) The Xpert Xpress SARS-CoV-2/FLU/RSV plus assay is intended as an aid in the diagnosis of influenza from Nasopharyngeal swab specimens and should not be used as a sole basis for treatment. Nasal washings and aspirates are unacceptable for Xpert Xpress SARS-CoV-2/FLU/RSV testing.  Fact Sheet for Patients: EntrepreneurPulse.com.au  Fact Sheet for Healthcare Providers: IncredibleEmployment.be  This test is not yet approved or cleared by the [place] FDA and has been authorized for detection and/or diagnosis of SARS-CoV-2 by FDA under an Emergency Use Authorization (EUA). This EUA will remain in effect (meaning this test can be used) for the duration of the COVID-19 declaration under Section 564(b)(1) of the Act, 21 U.S.C. section 360bbb-3(b)(1), unless the authorization is terminated or revoked.  Performed at [facility], [place] [address], [place], [place] [address]   Urine Culture     Status: Abnormal   Collection Time: 04/16/21 10:07 PM   Specimen: In/Out Cath Urine  Result Value Ref Range Status   Specimen Description   Final    IN/OUT CATH URINE Performed at [facility] [address] [place], [place] [address]    Special Requests   Final    NONE Performed at [facility], [place] [address], [place], [place] [address]    Culture (A)  Final    30,000 COLONIES/mL MULTIPLE SPECIES PRESENT, SUGGEST RECOLLECTION   Report Status 04/18/2021 FINAL  Final  Respiratory (~20 pathogens) panel by PCR     Status: None   Collection Time: 04/18/21  6:17 PM   Specimen: Nasopharyngeal Swab; Respiratory  Result Value Ref Range Status   Adenovirus NOT DETECTED NOT DETECTED Final   Coronavirus 229E NOT DETECTED NOT DETECTED Final    Comment: (NOTE) The Coronavirus on the Respiratory Panel, DOES NOT test for the novel  Coronavirus (2019 nCoV)    Coronavirus HKU1 NOT DETECTED NOT DETECTED Final   Coronavirus NL63 NOT DETECTED NOT DETECTED Final   Coronavirus OC43 NOT DETECTED NOT DETECTED Final   Metapneumovirus NOT DETECTED NOT DETECTED Final   Rhinovirus / Enterovirus NOT DETECTED NOT DETECTED Final   Influenza A NOT DETECTED NOT DETECTED Final   Influenza B NOT DETECTED NOT DETECTED Final   Parainfluenza Virus 1 NOT DETECTED NOT DETECTED Final   Parainfluenza Virus 2 NOT DETECTED NOT DETECTED Final   Parainfluenza Virus 3 NOT DETECTED NOT DETECTED Final   Parainfluenza Virus 4 NOT DETECTED NOT DETECTED Final   Respiratory Syncytial Virus NOT DETECTED NOT DETECTED Final   Bordetella pertussis NOT DETECTED NOT DETECTED Final   Bordetella Parapertussis NOT DETECTED NOT DETECTED Final   Chlamydophila pneumoniae NOT DETECTED NOT DETECTED Final   Mycoplasma pneumoniae NOT DETECTED NOT DETECTED Final    Comment: Performed at [facility], [place] [address] [place], [place] [address]    Procedures and diagnostic studies:  DG ESOPHAGUS W SINGLE CM (SOL OR THIN BA)  Result Date: 04/19/2021 CLINICAL DATA:  Dysphagia for 1 year. Prior thoracic radiation therapy and  prior chemotherapy. EXAM: ESOPHOGRAM/BARIUM SWALLOW TECHNIQUE:  Single contrast examination was performed using  thin barium. FLUOROSCOPY TIME:  Fluoroscopy Time:  2 minutes, 36 seconds Radiation Exposure Index (if provided by the fluoroscopic device): 25.7 mGy Number of Acquired Spot Images: 0 COMPARISON:  Multiple exams, including chest CT 12/03/2020 FINDINGS: Mild smooth circumferential narrowing of the barium column at the thoracic inlet for example on image 1 series 2 and on image 19 series 3, on some swallows such as on image 23 of series 3 there is a suggestion of a small residuum of contrast in this vicinity potentially from a small diverticulum or ulceration. Primary peristaltic waves are intact to the level of the mid to distal esophagus on 4/4 swallows. In the distal esophagus, there is secondary contraction in some mild tertiary contractions. Distal esophageal fold thickening is present for example on image 35 series 4 raising suspicion for esophagitis. Mild tortuosity of the distal esophagus potentially at least partially attributable to the moderate-sized hiatal hernia. The patient swallowed a 13 mm barium pill with which transiently impacted in the distal esophagus but which subsequently cleared with a second swallow of water. IMPRESSION: 1. At the junction of the cervical and thoracic segments of the esophagus, there is some focal smooth narrowing as on image 19 series 3, with a small persistent contrast collection in this vicinity on several swallows potentially representing a traction diverticulum or small ulceration. I have scrutinized this region on the prior CT of 12/03/2020 but I do not see a well-defined cause for this appearance on that study. The possibility of a somewhat high in position peptic stricture is raised. This seems higher in position than would be expected for the patient's prior radiation therapy. 2. Distal esophageal fold thickening raises suspicion for esophagitis. 3. Disruption of primary peristaltic waves in the distal esophagus although  there were propulsive secondary contractions and some tertiary contractions in the distal esophagus which were successful clearing the boluses. 4. Moderate-sized type 1 hiatal hernia resulting in some exaggerated curvature of the distal esophagus. 5. If clinically warranted, endoscopy could be utilized to confirm further assess the above findings. Electronically Signed   By: Van Clines M.D.   On: 04/19/2021 15:25    Medications:    amLODipine  10 mg Oral Daily   calcium carbonate  2,500 mg Oral BID   cholecalciferol  1,000 Units Oral Daily   ferrous sulfate  325 mg Oral QODAY   heparin  5,000 Units Subcutaneous Q8H   insulin aspart  0-5 Units Subcutaneous QHS   insulin aspart  0-9 Units Subcutaneous TID WC   metoprolol tartrate  25 mg Oral BID   pantoprazole  40 mg Oral BID   sodium chloride flush  10-40 mL Intracatheter Q12H   umeclidinium bromide  1 puff Inhalation Daily   Continuous Infusions:  ceFEPime (MAXIPIME) IV 2 g (04/20/21 1213)     LOS: 4 days   Geradine Girt  [facility]   How to contact the [facility] Attending or Consulting provider [place] or covering provider during after hours [place], for this patient?  Check the care team in [facility] and look for a) attending/consulting TR[facility] provider listed and b) the [facility] team listed Log into [email or web address] and use [place]'s universal password to access. If you do not have the password, please contact the hospital operator. Locate the [facility] provider you are looking for under [facility] and page to a number that you can be directly reached. If  you still have difficulty reaching the provider, please page the [facility] (Director on Call) for the Hospitalists listed on amion for assistance.  04/20/2021, 12:15 PM

## 2021-04-20 NOTE — Plan of Care (Signed)
  Problem: Health Behavior/Discharge Planning: Goal: Ability to manage health-related needs will improve Outcome: Progressing   Problem: Clinical Measurements: Goal: Will remain free from infection Outcome: Progressing Goal: Diagnostic test results will improve Outcome: Progressing   Problem: Activity: Goal: Risk for activity intolerance will decrease Outcome: Progressing   Problem: Nutrition: Goal: Adequate nutrition will be maintained Outcome: Progressing

## 2021-04-21 ENCOUNTER — Other Ambulatory Visit (HOSPITAL_COMMUNITY): Payer: Self-pay

## 2021-04-21 LAB — BASIC METABOLIC PANEL
Anion gap: 6 (ref 5–15)
BUN: 17 mg/dL (ref 8–23)
CO2: 25 mmol/L (ref 22–32)
Calcium: 8.9 mg/dL (ref 8.9–10.3)
Chloride: 110 mmol/L (ref 98–111)
Creatinine, Ser: 1.65 mg/dL — ABNORMAL HIGH (ref 0.61–1.24)
GFR, Estimated: 43 mL/min — ABNORMAL LOW (ref >60)
Glucose, Bld: 105 mg/dL — ABNORMAL HIGH (ref 70–99)
Potassium: 3.5 mmol/L (ref 3.5–5.1)
Sodium: 141 mmol/L (ref 135–145)

## 2021-04-21 LAB — CBC
HCT: 27.6 % — ABNORMAL LOW (ref 39.0–52.0)
Hemoglobin: 9.5 g/dL — ABNORMAL LOW (ref 13.0–17.0)
MCH: 33.9 pg (ref 26.0–34.0)
MCHC: 34.4 g/dL (ref 30.0–36.0)
MCV: 98.6 fL (ref 80.0–100.0)
Platelets: 155 10*3/uL (ref 150–400)
RBC: 2.8 MIL/uL — ABNORMAL LOW (ref 4.22–5.81)
RDW: 14.5 % (ref 11.5–15.5)
WBC: 3.5 10*3/uL — ABNORMAL LOW (ref 4.0–10.5)
nRBC: 0 % (ref 0.0–0.2)

## 2021-04-21 LAB — GLUCOSE, CAPILLARY
Glucose-Capillary: 97 mg/dL (ref 70–99)
Glucose-Capillary: 169 mg/dL — ABNORMAL HIGH (ref 70–99)
Glucose-Capillary: 120 mg/dL — ABNORMAL HIGH (ref 70–99)
Glucose-Capillary: 176 mg/dL — ABNORMAL HIGH (ref 70–99)

## 2021-04-21 MED ORDER — CALCIUM CARBONATE ANTACID 500 MG PO CHEW
1.0000 | CHEWABLE_TABLET | Freq: Three times a day (TID) | ORAL | Status: DC | PRN
  Administered 2021-04-21: 200 mg via ORAL
  Filled 2021-04-21: qty 1

## 2021-04-21 NOTE — Progress Notes (Addendum)
SATURATION QUALIFICATIONS: (This note is used to comply with regulatory documentation for home oxygen)  Patient Saturations on Room Air at Rest = 96%  Patient Saturations on Room Air while Ambulating = 88%  Patient Saturations on N/A Liters of oxygen while Ambulating = N/A%  Please briefly explain why patient needs home oxygen: Pt's lowest O2 sat while ambulating was 88% with SOB. Patient recovered and O2 sat was 92% and then finally 100% on RA.

## 2021-04-21 NOTE — Progress Notes (Signed)
PROGRESS NOTE  Richard Navarro  JOA:[id number] DOB: [birth year] DOA: 04/16/2021 PCP: [name], MD   Chief Complaint  Patient presents with   Fever  Brief Narrative:  75 y.o. 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 Presents by EMS for evaluation of fever, confusion.  Appears to be PNA-- ? Aspiration.  Patient was admitted managed with antibiotics, seen by speech.  Subjective: Seen this morning.  Still on 2 L nasal cannula Overnight no acute events.  Assessment & Plan:  Sepsis POA on admission fever 103 tachycardic tachypneic with confusion history of sepsis in the past chest x-ray no obvious consolidation UA negative blood cultures sent placed on broad-spectrum antibiotics blood culture negative so far.  Antibiotic de-escalated to Augmentin.  No clear source suspecting possible aspiration due to swallowing issues  Acute hypoxic respiratory failure suspecting due to above/?  Aspiration.  Speech following, discussed about lifestyle modification, continue PPI EGD deferred for now due to respiratory status-and will likely need outpatient GI evaluation soon.  Remain upright for at least 30 minutes after oral intake, sit upright at 90 degrees.  Continue PT OT DG Esophagoram:At the junction of the cervical and thoracic segments of the esophagus, there is some focal smooth narrowing as on image 19 series 3, with a small persistent contrast collection in this vicinity on several swallows potentially representing a traction diverticulum or small ulceration. I have scrutinized this  region on the prior CT of 12/03/2020 but I do not see a well-defined cause for this appearance on that study. The possibility of a somewhat high in position peptic stricture is raised. This seems higher in position than would be expected for the patient's prior radiation therapy. 2. Distal esophageal fold thickening raises suspicion for esophagitis. 3. Disruption of primary peristaltic waves in the distal esophagus although there were propulsive secondary contractions and some tertiary contractions in the distal esophagus which were successful clearing the boluses. 4. Moderate-sized type 1 hiatal hernia resulting in some exaggerated curvature of the distal esophagus  Diabetes mellitus with renal manifestation CKD: Blood sugar stable, OHA held to avoid hypoglycemia keep on sliding scale.  HbA1c stable 7.2 04/17/21 Recent Labs  Lab 04/20/21 1219 04/20/21 1711 04/20/21 2113 04/21/21 0740 04/21/21 1203  GLUCAP 154* 151* 154* 97 169*    Hypertension: BP controlled on Norvasc, metoprolol  COPD: Continue Spiriva albuterol.  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  Monitor  Primary cancer of right lung: Followed by oncology, on chemotherapy.  Chronic kidney disease (CKD) stage G3b/A1: is at  Baseline creatinine stable. Recent Labs  Lab 04/16/21 2144 04/17/21 0817 04/18/21 0233 04/20/21 0406 04/21/21 0809  BUN 22 20 18 17 17   CREATININE 1.86* 1.70* 1.63* 1.44* 1.65*    Prolonged QT interval: Avoid medication that could prolong QT.  Hiatal hernia diet change aspiration precaution PPI Diet Order             Diet heart healthy/carb modified Room service appropriate? Yes; Fluid consistency: Thin  Diet effective now  Patient's Body mass index is 23.56 kg/m. DVT prophylaxis: heparin injection 5,000 Units Start: 04/17/21 0600 Code Status:   Code Status: DNR  Family Communication: plan of care  discussed with patient at bedside. Status is: Inpatient Remains inpatient appropriate because:Inpatient level of care appropriate due to severity of illness  Dispo: The patient is from: Home              Anticipated d/c is to: Home              Patient currently is not medically stable to d/c.   Difficult to place patient No Unresulted Labs (From admission, onward)     Start     Ordered   04/22/21 2585  Basic metabolic panel  Daily,   R     Question:  Specimen collection method  Answer:  IV Team=IV Team collect   04/21/21 0718   04/22/21 0500  CBC  Daily,   R     Question:  Specimen collection method  Answer:  IV Team=IV Team collect   04/21/21 2778           Medications reviewed:  Scheduled Meds:  amLODipine  10 mg Oral Daily   amoxicillin-clavulanate  1 tablet Oral Q12H   calcium carbonate  2,500 mg Oral BID   cholecalciferol  1,000 Units Oral Daily   ferrous sulfate  325 mg Oral QODAY   heparin  5,000 Units Subcutaneous Q8H   insulin aspart  0-5 Units Subcutaneous QHS   insulin aspart  0-9 Units Subcutaneous TID WC   metoprolol tartrate  25 mg Oral BID   pantoprazole  40 mg Oral BID   sodium chloride flush  10-40 mL Intracatheter Q12H   umeclidinium bromide  1 puff Inhalation Daily   Continuous Infusions: Consultants:see note  Procedures:see note Antimicrobials: Anti-infectives (From admission, onward)    Start     Dose/Rate Route Frequency Ordered Stop   04/20/21 2000  amoxicillin-clavulanate (AUGMENTIN) 875-125 MG per tablet 1 tablet        1 tablet Oral Every 12 hours 04/20/21 1300     04/18/21 0800  vancomycin (VANCOREADY) IVPB 1750 mg/350 mL  Status:  Discontinued        1,750 mg 175 mL/hr over 120 Minutes Intravenous Every 36 hours 04/17/21 0046 04/17/21 1059   04/17/21 2200  vancomycin (VANCOREADY) IVPB 1250 mg/250 mL  Status:  Discontinued        1,250 mg 166.7 mL/hr over 90 Minutes Intravenous Every 24 hours 04/17/21 1059 04/19/21 1200   04/17/21 1000   ceFEPIme (MAXIPIME) 2 g in sodium chloride 0.9 % 100 mL IVPB  Status:  Discontinued        2 g 200 mL/hr over 30 Minutes Intravenous Every 12 hours 04/17/21 0046 04/20/21 1300   04/17/21 1000  metroNIDAZOLE (FLAGYL) IVPB 500 mg  Status:  Discontinued        500 mg 100 mL/hr over 60 Minutes Intravenous Every 12 hours 04/17/21 0258 04/18/21 1309   04/17/21 0245  ceFEPIme (MAXIPIME) 2 g in sodium chloride 0.9 % 100 mL IVPB  Status:  Discontinued        2 g 200 mL/hr over 30 Minutes Intravenous  Once 04/17/21 0235 04/17/21 0257   04/17/21 0245  metroNIDAZOLE (FLAGYL) IVPB 500 mg  Status:  Discontinued        500 mg 100 mL/hr over 60 Minutes Intravenous Every 12 hours 04/17/21 0235 04/17/21 0257   04/17/21 0245  vancomycin (VANCOCIN) IVPB 1000  mg/200 mL premix  Status:  Discontinued        1,000 mg 200 mL/hr over 60 Minutes Intravenous  Once 04/17/21 0235 04/17/21 0258   04/16/21 2200  ceFEPIme (MAXIPIME) 2 g in sodium chloride 0.9 % 100 mL IVPB        2 g 200 mL/hr over 30 Minutes Intravenous  Once 04/16/21 2146 04/16/21 2240   04/16/21 2200  metroNIDAZOLE (FLAGYL) IVPB 500 mg        500 mg 100 mL/hr over 60 Minutes Intravenous  Once 04/16/21 2146 04/16/21 2313   04/16/21 2200  vancomycin (VANCOCIN) IVPB 1000 mg/200 mL premix        1,000 mg 200 mL/hr over 60 Minutes Intravenous  Once 04/16/21 2146 04/16/21 2347      Culture/Microbiology    Component Value Date/Time   SDES  04/16/2021 2207    IN/OUT CATH URINE Performed at [facility], [place] [address], [place], [place] [address]    SPECREQUEST  04/16/2021 2207    NONE Performed at [facility], [place] [address], [place], [place] [address]    CULT (A) 04/16/2021 2207    30,000 COLONIES/mL MULTIPLE SPECIES PRESENT, SUGGEST RECOLLECTION   REPTSTATUS 04/18/2021 FINAL 04/16/2021 2207    Other culture-see note  Objective: Vitals: Today's Vitals   04/21/21 0505 04/21/21 0730 04/21/21 0805  04/21/21 0957  BP: (!) 144/69   135/77  Pulse: 63   77  Resp: 16     Temp: 99.2 F (37.3 C)     TempSrc: Oral     SpO2: 97%  95%   Weight:      Height:      PainSc:  0-No pain      Intake/Output Summary (Last 24 hours) at 04/21/2021 1316 Last data filed at 04/21/2021 0959 Gross per 24 hour  Intake 1200 ml  Output 1325 ml  Net -125 ml   Filed Weights   04/16/21 2133 04/17/21 0451  Weight: 88.5 kg 85.5 kg   Weight change:   Intake/Output from previous day: 08/31 0701 - 09/01 0700 In: 470 [P.O.:460; I.V.:10] Out: 1225 [Urine:1225] Intake/Output this shift: Total I/O In: 730 [P.O.:720; I.V.:10] Out: 450 [Urine:450] Filed Weights   04/16/21 2133 04/17/21 0451  Weight: 88.5 kg 85.5 kg   Examination: General exam: AAOx3. Elderly, fraile WNL grossly,dentition normal. Respiratory system: bilaterally diminished,no use of accessory muscle, non tender. Cardiovascular system: S1 & S2 +,No JVD. Gastrointestinal system: Abdomen soft, NT,ND, BS+. Nervous System:Alert, awake, moving extremities Extremities: No edema, distal peripheral pulses palpable.  Skin: No rashes,no icterus. MSK: Normal muscle bulk,tone, power Data Reviewed: I have personally reviewed following labs and imaging studies CBC: Recent Labs  Lab 04/16/21 2144 04/17/21 0817 04/18/21 0233 04/20/21 0406 04/21/21 0809  WBC 6.5 6.8 8.3 3.2* 3.5*  NEUTROABS 5.8  --   --  2.2  --   HGB 11.2* 9.4* 10.6* 7.8* 9.5*  HCT 32.6* 27.9* 31.0* 23.0* 27.6*  MCV 98.5 100.4* 98.7 99.6 98.6  PLT 156 131* 152 125* 102   Basic Metabolic Panel: Recent Labs  Lab 04/16/21 2144 04/17/21 0817 04/18/21 0233 04/20/21 0406 04/21/21 0809  NA 136 138 139 142 141  K 3.7 3.5 3.8 3.6 3.5  CL 100 109 107 112* 110  CO2 26 21* 25 23 25   GLUCOSE 156* 110* 129* 112* 105*  BUN 22 20 18 17 17   CREATININE 1.86* 1.70* 1.63* 1.44* 1.65*  CALCIUM 9.3 8.5* 8.9 8.9 8.9  GFR: Estimated Creatinine Clearance: 44.8 mL/min (A) (by C-G formula  based on SCr of 1.65 mg/dL (H)). Liver Function Tests: Recent Labs  Lab 04/16/21 2144 04/17/21 0817  AST 20 13*  ALT 13 10  ALKPHOS 67 44  BILITOT 0.7 0.8  PROT 7.8 6.1*  ALBUMIN 3.8 3.1*   No results for input(s): LIPASE, AMYLASE in the last 168 hours. No results for input(s): AMMONIA in the last 168 hours. Coagulation Profile: Recent Labs  Lab 04/16/21 2144  INR 1.0   Cardiac Enzymes: No results for input(s): CKTOTAL, CKMB, CKMBINDEX, TROPONINI in the last 168 hours. BNP (last 3 results) No results for input(s): PROBNP in the last 8760 hours. HbA1C: No results for input(s): HGBA1C in the last 72 hours. CBG: Recent Labs  Lab 04/20/21 1219 04/20/21 1711 04/20/21 2113 04/21/21 0740 04/21/21 1203  GLUCAP 154* 151* 154* 97 169*   Lipid Profile: No results for input(s): CHOL, HDL, LDLCALC, TRIG, CHOLHDL, LDLDIRECT in the last 72 hours. Thyroid Function Tests: No results for input(s): TSH, T4TOTAL, FREET4, T3FREE, THYROIDAB in the last 72 hours. Anemia Panel: No results for input(s): VITAMINB12, FOLATE, FERRITIN, TIBC, IRON, RETICCTPCT in the last 72 hours. Sepsis Labs: Recent Labs  Lab 04/16/21 2144 04/16/21 2344 04/17/21 0817  LATICACIDVEN 2.3* 1.8 1.0    Recent Results (from the past 240 hour(s))  Blood Culture (routine x 2)     Status: None (Preliminary result)   Collection Time: 04/16/21  9:44 PM   Specimen: BLOOD  Result Value Ref Range Status   Specimen Description   Final    BLOOD RIGHT ANTECUBITAL Performed at [facility] [name] [place], [place] [address]    Special Requests   Final    BOTTLES DRAWN AEROBIC AND ANAEROBIC Blood Culture adequate volume Performed at [facility] [address] [place], [place] [address]    Culture   Final    NO GROWTH 4 DAYS Performed at [facility], [place] [address], [place], [place] [address]    Report Status PENDING  Incomplete  Blood Culture (routine x  2)     Status: None (Preliminary result)   Collection Time: 04/16/21  9:45 PM   Specimen: BLOOD  Result Value Ref Range Status   Specimen Description   Final    BLOOD BLOOD RIGHT HAND Performed at [facility] [address], [place], [place] [address]    Special Requests   Final    BOTTLES DRAWN AEROBIC AND ANAEROBIC Blood Culture results may not be optimal due to an inadequate volume of blood received in culture bottles Performed at [facility] [address], [place], [place] [address]    Culture   Final    NO GROWTH 4 DAYS Performed at [facility], [place] [address] [place], [place] [address]    Report Status PENDING  Incomplete  Resp Panel by RT-PCR (Flu A&B, Covid) Nasopharyngeal Swab     Status: None   Collection Time: 04/16/21 10:07 PM   Specimen: Nasopharyngeal Swab; Nasopharyngeal(NP) swabs in vial transport medium  Result Value Ref Range Status   SARS Coronavirus 2 by RT PCR NEGATIVE NEGATIVE Final    Comment: (NOTE) SARS-CoV-2 target nucleic acids are NOT DETECTED.  The SARS-CoV-2 RNA is generally detectable in upper respiratory specimens during the acute phase of infection. The lowest concentration of SARS-CoV-2 viral copies this assay can detect is 138 copies/mL. A negative result does not preclude SARS-Cov-2  infection and should not be used as the sole basis for treatment or other patient management decisions. A negative result may occur with  improper specimen collection/handling, submission of specimen other than nasopharyngeal swab, presence of viral mutation(s) within the areas targeted by this assay, and inadequate number of viral copies(<138 copies/mL). A negative result must be combined with clinical observations, patient history, and epidemiological information. The expected result is Negative.  Fact Sheet for Patients:  EntrepreneurPulse.com.au  Fact Sheet for Healthcare Providers:   IncredibleEmployment.be  This test is no t yet approved or cleared by the [place] FDA and  has been authorized for detection and/or diagnosis of SARS-CoV-2 by FDA under an Emergency Use Authorization (EUA). This EUA will remain  in effect (meaning this test can be used) for the duration of the COVID-19 declaration under Section 564(b)(1) of the Act, 21 U.S.C.section 360bbb-3(b)(1), unless the authorization is terminated  or revoked sooner.       Influenza A by PCR NEGATIVE NEGATIVE Final   Influenza B by PCR NEGATIVE NEGATIVE Final    Comment: (NOTE) The Xpert Xpress SARS-CoV-2/FLU/RSV plus assay is intended as an aid in the diagnosis of influenza from Nasopharyngeal swab specimens and should not be used as a sole basis for treatment. Nasal washings and aspirates are unacceptable for Xpert Xpress SARS-CoV-2/FLU/RSV testing.  Fact Sheet for Patients: EntrepreneurPulse.com.au  Fact Sheet for Healthcare Providers: IncredibleEmployment.be  This test is not yet approved or cleared by the [place] FDA and has been authorized for detection and/or diagnosis of SARS-CoV-2 by FDA under an Emergency Use Authorization (EUA). This EUA will remain in effect (meaning this test can be used) for the duration of the COVID-19 declaration under Section 564(b)(1) of the Act, 21 U.S.C. section 360bbb-3(b)(1), unless the authorization is terminated or revoked.  Performed at [facility], [place] [address], [place], [place] [address]   Urine Culture     Status: Abnormal   Collection Time: 04/16/21 10:07 PM   Specimen: In/Out Cath Urine  Result Value Ref Range Status   Specimen Description   Final    IN/OUT CATH URINE Performed at [facility] [address] [place], [place] [address]    Special Requests   Final    NONE Performed at [facility], [place] [address]  [place], [place] [address]    Culture (A)  Final    30,000 COLONIES/mL MULTIPLE SPECIES PRESENT, SUGGEST RECOLLECTION   Report Status 04/18/2021 FINAL  Final  Respiratory (~20 pathogens) panel by PCR     Status: None   Collection Time: 04/18/21  6:17 PM   Specimen: Nasopharyngeal Swab; Respiratory  Result Value Ref Range Status   Adenovirus NOT DETECTED NOT DETECTED Final   Coronavirus 229E NOT DETECTED NOT DETECTED Final    Comment: (NOTE) The Coronavirus on the Respiratory Panel, DOES NOT test for the novel  Coronavirus (2019 nCoV)    Coronavirus HKU1 NOT DETECTED NOT DETECTED Final   Coronavirus NL63 NOT DETECTED NOT DETECTED Final   Coronavirus OC43 NOT DETECTED NOT DETECTED Final   Metapneumovirus NOT DETECTED NOT DETECTED Final   Rhinovirus / Enterovirus NOT DETECTED NOT DETECTED Final   Influenza A NOT DETECTED NOT DETECTED Final   Influenza B NOT DETECTED NOT DETECTED Final   Parainfluenza Virus 1 NOT DETECTED NOT DETECTED Final   Parainfluenza Virus 2 NOT DETECTED NOT DETECTED Final   Parainfluenza Virus 3 NOT DETECTED NOT DETECTED Final   Parainfluenza Virus 4 NOT  DETECTED NOT DETECTED Final   Respiratory Syncytial Virus NOT DETECTED NOT DETECTED Final   Bordetella pertussis NOT DETECTED NOT DETECTED Final   Bordetella Parapertussis NOT DETECTED NOT DETECTED Final   Chlamydophila pneumoniae NOT DETECTED NOT DETECTED Final   Mycoplasma pneumoniae NOT DETECTED NOT DETECTED Final    Comment: Performed at [facility], [place] [address] [place], [place] [address]     Radiology Studies: ECHOCARDIOGRAM COMPLETE  Result Date: 04/20/2021    ECHOCARDIOGRAM REPORT   Patient Name:   JUSTON GOHEEN Date of Exam: 04/20/2021 Medical Rec #:  [id number]         Height:       75.0 in Accession #:    [id number]        Weight:       188.5 lb Date of Birth:  March 16, 1946         BSA:          2.140 m Patient Age:    63 years          BP:           124/86 mmHg Patient Gender: M                  HR:           77 bpm. Exam Location:  Inpatient Procedure: 2D Echo, Cardiac Doppler and Color Doppler Indications:    dyspea  History:        Patient has no prior history of Echocardiogram examinations.                 COPD, Signs/Symptoms:Dyspnea; Risk Factors:Hypertension and                 Diabetes.  Sonographer:    MH Referring Phys: [place]  1. Left ventricular ejection fraction, by estimation, is 60 to 65%. The left ventricle has normal function. The left ventricle has no regional wall motion abnormalities. There is moderate concentric left ventricular hypertrophy. Left ventricular diastolic parameters were normal.  2. Right ventricular systolic function is normal. The right ventricular size is normal. Tricuspid regurgitation signal is inadequate for assessing PA pressure.  3. The mitral valve is normal in structure. No evidence of mitral valve regurgitation. No evidence of mitral stenosis.  4. The aortic valve is tricuspid. There is mild calcification of the aortic valve. There is mild thickening of the aortic valve. Aortic valve regurgitation is trivial. Mild aortic valve sclerosis is present, with no evidence of aortic valve stenosis.  5. The inferior vena cava is normal in size with greater than 50% respiratory variability, suggesting right atrial pressure of 3 mmHg. Comparison(s): No prior Echocardiogram. Conclusion(s)/Recommendation(s): Otherwise normal echocardiogram, with minor abnormalities described in the report. FINDINGS  Left Ventricle: Left ventricular ejection fraction, by estimation, is 60 to 65%. The left ventricle has normal function. The left ventricle has no regional wall motion abnormalities. The left ventricular internal cavity size was normal in size. There is  moderate concentric left ventricular hypertrophy. Left ventricular diastolic parameters were normal. Right Ventricle: The right ventricular size is normal. No increase in right ventricular wall thickness.  Right ventricular systolic function is normal. Tricuspid regurgitation signal is inadequate for assessing PA pressure. Left Atrium: Left atrial size was normal in size. Right Atrium: Right atrial size was normal in size. Pericardium: Trivial pericardial effusion is present. Mitral Valve: The mitral valve is normal in structure. No evidence of mitral valve regurgitation. No evidence  of mitral valve stenosis. Tricuspid Valve: The tricuspid valve is normal in structure. Tricuspid valve regurgitation is not demonstrated. No evidence of tricuspid stenosis. Aortic Valve: The aortic valve is tricuspid. There is mild calcification of the aortic valve. There is mild thickening of the aortic valve. Aortic valve regurgitation is trivial. Mild aortic valve sclerosis is present, with no evidence of aortic valve stenosis. Aortic valve mean gradient measures 4.0 mmHg. Aortic valve peak gradient measures 5.9 mmHg. Aortic valve area, by VTI measures 3.77 cm. Pulmonic Valve: The pulmonic valve was not well visualized. Pulmonic valve regurgitation is not visualized. No evidence of pulmonic stenosis. Aorta: The aortic root, ascending aorta and aortic arch are all structurally normal, with no evidence of dilitation or obstruction. Venous: The inferior vena cava is normal in size with greater than 50% respiratory variability, suggesting right atrial pressure of 3 mmHg. IAS/Shunts: The atrial septum is grossly normal.  LEFT VENTRICLE PLAX 2D LVIDd:         3.50 cm     Diastology LVIDs:         2.30 cm     LV e' medial:    7.07 cm/s LV PW:         1.40 cm     LV E/e' medial:  10.7 LV IVS:        1.60 cm     LV e' lateral:   5.11 cm/s LVOT diam:     2.40 cm     LV E/e' lateral: 14.8 LV SV:         108 LV SV Index:   51 LVOT Area:     4.52 cm  LV Volumes (MOD) LV vol d, MOD A4C: 49.5 ml LV vol s, MOD A4C: 20.7 ml LV SV MOD A4C:     49.5 ml RIGHT VENTRICLE             IVC RV S prime:     10.40 cm/s  IVC diam: 1.10 cm TAPSE (M-mode): 1.9 cm  LEFT ATRIUM           Index       RIGHT ATRIUM          Index LA diam:      3.00 cm 1.40 cm/m  RA Area:     7.69 cm LA Vol (A2C): 26.7 ml 12.48 ml/m RA Volume:   14.30 ml 6.68 ml/m LA Vol (A4C): 27.6 ml 12.90 ml/m  AORTIC VALVE AV Area (Vmax):    4.45 cm AV Area (Vmean):   3.71 cm AV Area (VTI):     3.77 cm AV Vmax:           121.00 cm/s AV Vmean:          99.800 cm/s AV VTI:            0.287 m AV Peak Grad:      5.9 mmHg AV Mean Grad:      4.0 mmHg LVOT Vmax:         119.00 cm/s LVOT Vmean:        81.800 cm/s LVOT VTI:          0.239 m LVOT/AV VTI ratio: 0.83  AORTA Ao Root diam: 3.00 cm Ao Asc diam:  3.10 cm MITRAL VALVE MV Area (PHT): 3.67 cm     SHUNTS MV E velocity: 75.80 cm/s   Systemic VTI:  0.24 m MV A velocity: 100.00 cm/s  Systemic Diam: 2.40 cm MV E/A ratio:  0.76  Buford Dresser MD Electronically signed by Buford Dresser MD Signature Date/Time: 04/20/2021/5:50:21 PM    Final    DG ESOPHAGUS W SINGLE CM (SOL OR THIN BA)  Result Date: 04/19/2021 CLINICAL DATA:  Dysphagia for 1 year. Prior thoracic radiation therapy and prior chemotherapy. EXAM: ESOPHOGRAM/BARIUM SWALLOW TECHNIQUE: Single contrast examination was performed using  thin barium. FLUOROSCOPY TIME:  Fluoroscopy Time:  2 minutes, 36 seconds Radiation Exposure Index (if provided by the fluoroscopic device): 25.7 mGy Number of Acquired Spot Images: 0 COMPARISON:  Multiple exams, including chest CT 12/03/2020 FINDINGS: Mild smooth circumferential narrowing of the barium column at the thoracic inlet for example on image 1 series 2 and on image 19 series 3, on some swallows such as on image 23 of series 3 there is a suggestion of a small residuum of contrast in this vicinity potentially from a small diverticulum or ulceration. Primary peristaltic waves are intact to the level of the mid to distal esophagus on 4/4 swallows. In the distal esophagus, there is secondary contraction in some mild tertiary contractions. Distal  esophageal fold thickening is present for example on image 35 series 4 raising suspicion for esophagitis. Mild tortuosity of the distal esophagus potentially at least partially attributable to the moderate-sized hiatal hernia. The patient swallowed a 13 mm barium pill with which transiently impacted in the distal esophagus but which subsequently cleared with a second swallow of water. IMPRESSION: 1. At the junction of the cervical and thoracic segments of the esophagus, there is some focal smooth narrowing as on image 19 series 3, with a small persistent contrast collection in this vicinity on several swallows potentially representing a traction diverticulum or small ulceration. I have scrutinized this region on the prior CT of 12/03/2020 but I do not see a well-defined cause for this appearance on that study. The possibility of a somewhat high in position peptic stricture is raised. This seems higher in position than would be expected for the patient's prior radiation therapy. 2. Distal esophageal fold thickening raises suspicion for esophagitis. 3. Disruption of primary peristaltic waves in the distal esophagus although there were propulsive secondary contractions and some tertiary contractions in the distal esophagus which were successful clearing the boluses. 4. Moderate-sized type 1 hiatal hernia resulting in some exaggerated curvature of the distal esophagus. 5. If clinically warranted, endoscopy could be utilized to confirm further assess the above findings. Electronically Signed   By: Van Clines M.D.   On: 04/19/2021 15:25     LOS: 5 days   Antonieta Pert, MD [facility]  04/21/2021, 1:16 PM

## 2021-04-21 NOTE — TOC Progression Note (Signed)
Transition of Care [facility]) - Progression Note    Patient Details  Name: Richard Navarro MRN: [id number] Date of Birth: Aug 08, 1946  Transition of Care [facility] CM/SW Contact  Purcell Mouton, RN Phone Number: 04/21/2021, 4:01 PM  Clinical Narrative:    Spoke with pt concerning WC, pt states that he will not need a WC.    Expected Discharge Plan: Home/Self Care Barriers to Discharge: No Barriers Identified  Expected Discharge Plan and Services Expected Discharge Plan: Home/Self Care       Living arrangements for the past 2 months: Single Family Home                                       Social Determinants of Health (SDOH) Interventions    Readmission Risk Interventions No flowsheet data found.

## 2021-04-21 NOTE — Progress Notes (Signed)
  Speech Language Pathology Treatment: Dysphagia  Patient Details Name: Richard Navarro MRN: [id number] DOB: 1945/11/12 Today's Date: 04/21/2021 Time: 8266-6648 SLP Time Calculation (min) (ACUTE ONLY): 14 min  Assessment / Plan / Recommendation Clinical Impression  Pt advised to consume liquids t/o meal - but he reports he has developed a "habit" during [place] that prevents him from drinking water that has caused him kidney injury.  SLP advised he drink water as he MD indicates and providing him with tisp for reminders but pt reports "they won't work".   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  Pt admits to refluxing - most notably in the middle of the night and his PPI not preventing symptoms. Informed MD of his symptoms as Dr Lupita Leash arrived to pt's room.     Pt does have an appt with GI as an OP - and SLP printed his esophagram report for him. Advised he call GI MD office to inform them of his esophagram testing conduted here to allow GI MD to review. Provided him compensation strategies and reviewed importance of staying upright for at least 30 minutes after meals trying to consume small frquednt meals.    Pt reports he was sensate pointing to distal esophagus to tablet transiently halting in esophagus.     All education completed for dysphagia mitigation strategies. No follow up SLP indicated.    HPI HPI: 75 y.o. 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 Evaluation of fever, confusion.  Swallow evaluation  ordered.  Pt underwent esophagram - and SLP follow up for dysphagia management/education.      SLP Plan  All goals met       Recommendations  Diet recommendations: Regular;Thin liquid Liquids provided via: Cup;Straw Medication Administration: Whole meds with liquid Compensations: Slow rate;Small sips/bites (upright for 30 minutes after meal) Postural Changes and/or Swallow Maneuvers: Seated upright 90 degrees;Upright 30-60 min after meal                Oral Care Recommendations: Oral care BID Follow up Recommendations: None SLP Visit Diagnosis: Dysphagia, unspecified (R13.10) Plan: All goals met       GO              Kathleen Lime, MS [facility] SLP Acute Rehab Services Office [phone] Pager [phone]    Macario Golds 04/21/2021, 12:09 PM

## 2021-04-21 NOTE — TOC Progression Note (Signed)
Transition of Care [facility]) - Progression Note    Patient Details  Name: KASHAUN BEBO MRN: [id number] Date of Birth: 05-Mar-1946  [facility] CM/SW Contact  Purcell Mouton, RN Phone Number: 04/21/2021, 3:45 PM  Clinical Narrative:    Spoke with pt concerning O2 and OP PT. Pt states he will continue with OP PT. O2 from [employer], will need order.    Expected Discharge Plan: Home/Self Care Barriers to Discharge: No Barriers Identified  Expected Discharge Plan and Services Expected Discharge Plan: Home/Self Care       Living arrangements for the past 2 months: Single Family Home                                       Social Determinants of Health (SDOH) Interventions    Readmission Risk Interventions No flowsheet data found.

## 2021-04-21 NOTE — Progress Notes (Signed)
  SATURATION QUALIFICATIONS: (This note is used to comply with regulatory documentation for home oxygen)  Patient Saturations on Room Air at Rest = 95%  Patient Saturations on Room Air while Ambulating = 86 %  Patient Saturations on 2 Liters of oxygen while Ambulating = 98 %  Please briefly explain why patient needs home oxygen:  Pt presented with shortness of breath while ambulating, pt rest during ambulation to  recover; O2 placed @ 2 liters to increase O2 sat 98% while ambulating.  SRP, RN

## 2021-04-21 NOTE — Progress Notes (Signed)
Physical Therapy Treatment Patient Details Name: Richard Navarro MRN: [id number] DOB: Sep 07, 1945 Today's Date: 04/21/2021    History of Present Illness 75 y.o. male admitted 04/16/21 for sepsis.  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  Could not get good SpO2 reading, but noted no SOB or pallor.  Feel stable for home and likely not to need follow up PT at d/c.   Follow Up Recommendations  Supervision for mobility/OOB;No PT follow up     Equipment Recommendations  None recommended by PT    Recommendations for Other Services       Precautions / Restrictions Precautions Precautions: Fall Precaution Comments: monitor sats    Mobility  Bed Mobility Overal bed mobility: Modified Independent                  Transfers Overall transfer level: Modified independent                  Ambulation/Gait Ambulation/Gait assistance: Supervision Gait Distance (Feet): 400 Feet Assistive device: Rolling walker (2 wheeled) Gait Pattern/deviations: Step-through pattern;Decreased stride length     General Gait Details: moving in the room around obstacles lifting walker up and around; assist for safety, several stops to measure SpO2 without good accuracy as HR did not correlate to monitor, but measured 76-79%; RN aware   Stairs             Wheelchair Mobility    Modified Rankin (Stroke Patients Only)       Balance Overall balance assessment: Needs assistance    Sitting balance-Leahy Scale: Good       Standing balance-Leahy Scale: Good Standing balance comment: lifts walker up around obstacles in the room no LOB                            Cognition Arousal/Alertness: Awake/alert Behavior During Therapy: WFL for tasks assessed/performed Overall Cognitive Status: Within Functional Limits for tasks assessed                                        Exercises      General Comments        Pertinent Vitals/Pain Pain Assessment: No/denies pain    Home Living                      Prior Function            PT Goals (current goals can now be found in the care plan section) Progress towards PT goals: Progressing toward goals    Frequency    Min 3X/week      PT Plan Current plan remains appropriate    Co-evaluation              AM-PAC PT "6 Clicks" Mobility   Outcome Measure  Help needed turning from your back to your side while  in a flat bed without using bedrails?: None Help needed moving from lying on your back to sitting on the side of a flat bed without using bedrails?: None Help needed moving to and from a bed to a chair (including a wheelchair)?: None Help needed standing up from a chair using your arms (e.g., wheelchair or bedside chair)?: None Help needed to walk in hospital room?: A Little Help needed climbing 3-5 steps with a railing? : A Little 6 Click Score: 22    End of Session Equipment Utilized During Treatment: Oxygen Activity Tolerance: Patient tolerated treatment well Patient left: in bed;with family/visitor present;with bed alarm set;with call bell/phone within reach   PT Visit Diagnosis: Difficulty in walking, not elsewhere classified (R26.2)     Time: 4621-9471 PT Time Calculation (min) (ACUTE ONLY): 31 min  Charges:  $Gait Training: 8-22 mins $Therapeutic Activity: 8-22 mins                     Magda Kiel, PT Acute Rehabilitation  Services Pager:[phone] Office:[phone] 04/21/2021    Reginia Naas 04/21/2021, 4:55 PM

## 2021-04-22 ENCOUNTER — Other Ambulatory Visit (HOSPITAL_COMMUNITY): Payer: Self-pay

## 2021-04-22 ENCOUNTER — Inpatient Hospital Stay (HOSPITAL_COMMUNITY): Payer: Medicare Other

## 2021-04-22 LAB — CBC
HCT: 27.9 % — ABNORMAL LOW (ref 39.0–52.0)
Hemoglobin: 9.6 g/dL — ABNORMAL LOW (ref 13.0–17.0)
MCH: 33.4 pg (ref 26.0–34.0)
MCHC: 34.4 g/dL (ref 30.0–36.0)
MCV: 97.2 fL (ref 80.0–100.0)
Platelets: 173 10*3/uL (ref 150–400)
RBC: 2.87 MIL/uL — ABNORMAL LOW (ref 4.22–5.81)
RDW: 14.8 % (ref 11.5–15.5)
WBC: 3.9 10*3/uL — ABNORMAL LOW (ref 4.0–10.5)
nRBC: 0 % (ref 0.0–0.2)

## 2021-04-22 LAB — BASIC METABOLIC PANEL
Anion gap: 5 (ref 5–15)
BUN: 17 mg/dL (ref 8–23)
CO2: 26 mmol/L (ref 22–32)
Calcium: 8.5 mg/dL — ABNORMAL LOW (ref 8.9–10.3)
Chloride: 107 mmol/L (ref 98–111)
Creatinine, Ser: 1.58 mg/dL — ABNORMAL HIGH (ref 0.61–1.24)
GFR, Estimated: 45 mL/min — ABNORMAL LOW (ref >60)
Glucose, Bld: 102 mg/dL — ABNORMAL HIGH (ref 70–99)
Potassium: 3.5 mmol/L (ref 3.5–5.1)
Sodium: 138 mmol/L (ref 135–145)

## 2021-04-22 LAB — CULTURE, BLOOD (ROUTINE X 2)
Culture: NO GROWTH
Culture: NO GROWTH
Special Requests: ADEQUATE

## 2021-04-22 LAB — GLUCOSE, CAPILLARY
Glucose-Capillary: 102 mg/dL — ABNORMAL HIGH (ref 70–99)
Glucose-Capillary: 134 mg/dL — ABNORMAL HIGH (ref 70–99)

## 2021-04-22 MED ORDER — AMOXICILLIN-POT CLAVULANATE 875-125 MG PO TABS: 1.0000 | ORAL_TABLET | Freq: Two times a day (BID) | ORAL | 0 refills | Status: AC

## 2021-04-22 MED ORDER — PANTOPRAZOLE SODIUM 40 MG PO TBEC
40.0000 mg | DELAYED_RELEASE_TABLET | Freq: Two times a day (BID) | ORAL | 0 refills | Status: DC
Start: 2021-04-22 — End: 2021-07-21

## 2021-04-22 NOTE — Plan of Care (Signed)
  Problem: Clinical Measurements: Goal: Will remain free from infection Outcome: Progressing Goal: Diagnostic test results will improve Outcome: Progressing   Problem: Nutrition: Goal: Adequate nutrition will be maintained Outcome: Adequate for Discharge

## 2021-04-22 NOTE — Discharge Summary (Signed)
Physician Discharge Summary  Richard Navarro HWE:[id number] DOB: 09-16-1945 DOA: 04/16/2021  PCP: Richard Olp, MD  Admit date: 04/16/2021 Discharge date: 04/22/2021  Admitted From: home Disposition:  home  Recommendations for Outpatient Follow-up:  Follow up with PCP in 1-2 weeks, With gastroenterology in 1 week  Home Health:no  Equipment/Devices: none  Discharge Condition: Stable Code Status:   Code Status: DNR Diet recommendation:  Diet Order             Diet heart healthy/carb modified Room service appropriate? Yes; Fluid consistency: Thin  Diet effective now                    Brief/Interim Summary:  75 y.o. 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 Presents by EMS for evaluation of fever, confusion.  Appears to be PNA-- ? Aspiration.  Patient was admitted managed with antibiotics, seen by speech. Ongoing DG esophagogram-as below.  He was supposed to follow-up with gastroenterology  as OP  is instructed to follow-up with them soon.  He is going to call the office and let them know about  DG esophagogram.Prescribed him PPI twice daily.   Discharge Diagnoses:  Sepsis POA on admission fever 103 tachycardic tachypneic with confusion history of sepsis in the past chest x-ray no obvious consolidation UA negative blood cultures sent placed on broad-spectrum antibiotics blood culture negative so far.  Antibiotic de-escalated to Augmentin-continue p.o. days to complete the course.No clear source suspecting possible aspiration due to swallowing issues.  Overall  afebrile, WBC count stable Recent Labs  Lab 04/16/21 2144 04/16/21 2344 04/17/21 0817 04/18/21 0233 04/20/21 0406 04/21/21 0809 04/22/21 0350  WBC 6.5  --  6.8 8.3 3.2* 3.5* 3.9*  LATICACIDVEN 2.3* 1.8 1.0  --   --   --   --       Acute hypoxic respiratory failure suspecting due to above/?  Aspiration.  Speech following, discussed about lifestyle modification, continue PPI EGD deferred for now due to respiratory status-and supposed to see [facility] PTA - he will call office for follow up soon. He is to remain upright for at least 30 minutes after oral intake, sit upright at 90 degrees.  Continue PT OT.  Continue submental oxygen at home DG Esophagoram:At the junction of the cervical and thoracic segments of the esophagus, there is some focal smooth narrowing as on image 19 series 3, with a small persistent contrast collection in this vicinity on several swallows potentially representing a traction diverticulum or small ulceration. I have scrutinized this region on the prior CT of 12/03/2020 but I do not see a well-defined cause for this appearance on that study. The possibility of a somewhat high in position peptic stricture is raised. This seems higher in position than would be expected for the patient's prior radiation therapy. 2. Distal esophageal fold thickening raises suspicion for esophagitis. 3. Disruption of primary peristaltic waves in the distal esophagus although there were propulsive secondary contractions and some tertiary contractions in the distal esophagus which were successful clearing the boluses. 4. Moderate-sized type 1 hiatal hernia resulting in some exaggerated curvature of the distal esophagus.  Again he is going to follow-up with GI soon, is going to call the office.   Diabetes mellitus with renal manifestation CKD: Blood sugar stable, OHA held to avoid hypoglycemia keep on sliding scale.  HbA1c stable 7.2 04/17/21 Recent Labs  Lab 04/21/21 0740 04/21/21 1203  04/21/21 1556 04/21/21 2210 04/22/21 0742  GLUCAP 97 169* 120* 176* 102*    Hypertension: BP controlled on Norvasc, metoprolol   COPD: Continue Spiriva albuterol.  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  Monitor as outpatient   Primary cancer of right lung: Followed by oncology, on chemotherapy.   Chronic kidney disease (CKD) stage G3b/A1: is at  Baseline creatinine stable Recent Labs  Lab 04/17/21 0817 04/18/21 0233 04/20/21 0406 04/21/21 0809 04/22/21 0350  BUN 20 18 17 17 17   CREATININE 1.70* 1.63* 1.44* 1.65* 1.58*    Prolonged QT interval: Avoid medication that could prolong QT.   Hiatal hernia diet change aspiration precaution PPI  Left elbow pain, from fall on 04/16/21 mild soft tissue swelling on elbow, no obvious cellulitis fluid collection on exam, x-ray shows no Bony normalities.  We discussed the result in detail and instructed him to return to the ED or notify us MD if he has worsening pain tenderness redness or swelling on his elbow area. Consults: none  Subjective: Alert awake oriented.  No fever.  Mild left elbow pain - had a fall last Friday.  Discharge Exam: Vitals:   04/22/21 0756 04/22/21 1015  BP:  136/73  Pulse:  73  Resp:  18  Temp:  98.5 F (36.9 C)  SpO2: 94% 94%   General: Pt is alert, awake, not in acute distress Cardiovascular: RRR, S1/S2 +, no rubs, no gallops Respiratory: CTA bilaterally, no wheezing, no rhonchi Abdominal: Soft, NT, ND, bowel sounds + Extremities: no edema, no cyanosis  Discharge Instructions   Allergies as of 04/22/2021       Reactions   Lisinopril Swelling   Angioedema- on this and afinitor same time   Simvastatin Other (See Comments)   Joint ache        Medication List     STOP taking these medications    Hydrocortisone Micronized Powd   Pfizer-BioNT COVID-19 Vac-TriS Susp injection Generic drug: COVID-19 mRNA Vac-TriS  (Pfizer)       TAKE these medications    albuterol 108 (90 Base) MCG/ACT inhaler Commonly known as: VENTOLIN HFA Inhale 2 puffs into the lungs every 4 (four) hours as needed for wheezing or shortness of breath.   allopurinol 100 MG tablet Commonly known as: ZYLOPRIM TAKE 1 TABLET DAILY   amLODipine 10 MG tablet Commonly known as: NORVASC TAKE 1 TABLET DAILY   amoxicillin-clavulanate 875-125 MG tablet Commonly known as: AUGMENTIN Take 1 tablet by mouth 2 (two) times daily for 3 days. What changed:  how much to take how to take this when to take this   calcium carbonate 1250 (500 Ca) MG chewable tablet Commonly known as: OS-CAL Chew 2 tablets by mouth 2 (two) times daily.   capecitabine 500 MG tablet Commonly known as: XELODA TAKE 3 TABLETS BY MOUTH TWICE DAILY FOR 14 DAYS EVERY 4 WEEKS.   FreeStyle Libre 14 Day Sensor Misc USE TO CHECK BLOOD SUGAR AS DIRECTED AND CHANGE EVERY 14 DAYS   glipiZIDE 5 MG tablet Commonly known as: GLUCOTROL TAKE 1 TABLET DAILY BEFORE BREAKFAST   glucose blood test strip Use to test blood  sugar twice a day   guaiFENesin 600 MG 12 hr tablet Commonly known as: MUCINEX Take 600 mg by mouth 2 (two) times daily.   Iron 325 (65 Fe) MG Tabs Take 1 tablet by mouth every other day.   ketotifen 0.025 % ophthalmic solution Commonly known as: ZADITOR Place 1 drop into both eyes daily as needed (allergies).   magic mouthwash w/lidocaine Soln Take 5 mLs by mouth every 6 (six) hours as needed for mouth pain.   metoprolol tartrate 25 MG tablet Commonly known as: LOPRESSOR Take 1 tablet (25 mg total) by mouth 2 (two) times daily.   ondansetron 8 MG tablet Commonly known as: ZOFRAN TAKE 1 TABLET BY MOUTH EVERY 8 HOURS AS NEEDED FOR NAUSEA AND/OR VOMITING   pantoprazole 40 MG tablet Commonly known as: PROTONIX Take 1 tablet (40 mg total) by mouth 2 (two) times daily. What changed: when to take this   polyethylene glycol 17 g  packet Commonly known as: MIRALAX / GLYCOLAX Take 17 g by mouth daily.   Spiriva Respimat 2.5 MCG/ACT Aers Generic drug: Tiotropium Bromide Monohydrate Take 2 puffs by mouth daily.   sucralfate 1 g tablet Commonly known as: CARAFATE TAKE 1 TABLET BY MOUTH FOUR TIMES DAILY WITH MEALS AND AT BEDTIME   temozolomide 100 MG capsule Commonly known as: TEMODAR TAKE 4 CAPS BY MOUTH DAILY DAYS 10,11,12,13 AND 14 EVERY 4 WEEKS. TAKE ON AN EMPTY STOMACH OR AT BEDTIME TO DECREASE NAUSEA & VOMITING What changed:  how much to take how to take this when to take this additional instructions   vitamin B-12 1000 MCG tablet Commonly known as: CYANOCOBALAMIN Take 1,000 mcg by mouth daily.   vitamin C 500 MG tablet Commonly known as: ASCORBIC ACID Take 500 mg by mouth daily.   Vitamin D3 25 MCG (1000 UT) Caps Take 1 capsule by mouth daily.               Durable Medical Equipment  (From admission, onward)           Start     Ordered   04/21/21 1603  For home use only DME oxygen  Once       Question Answer Comment  Length of Need Lifetime   Mode or (Route) Nasal cannula   Liters per Minute 2   Frequency Continuous (stationary and portable oxygen unit needed)   Oxygen delivery system Gas      04/21/21 1602            Follow-up [facility] Follow up.   [facility] [address] [place], [place] [address] [phone] This is the company you Oxygen is from. Please call if you have any problems.        [facility] Follow up in 1 day(s).   Specialty: Gastroenterology Why: for folllw up soon Contact information: [place] [address] [phone]               Allergies  Allergen Reactions   Lisinopril Swelling    Angioedema- on this and afinitor same time   Simvastatin Other (See Comments)    Joint ache    The results of significant diagnostics from this  hospitalization (including imaging, microbiology, ancillary and laboratory) are listed below for reference.    Microbiology: Recent Results (from the past 240 hour(s))  Blood Culture (routine x 2)     Status: None   Collection Time: 04/16/21  9:44 PM   Specimen: BLOOD  Result Value Ref Range Status   Specimen Description   Final    BLOOD RIGHT ANTECUBITAL Performed at [facility] [address], [place], [place] [address]    Special Requests   Final    BOTTLES DRAWN AEROBIC AND ANAEROBIC Blood Culture adequate volume Performed at [facility] [address], [place], [place] [address]    Culture   Final    NO GROWTH 5 DAYS Performed at [facility], [place] [address], [place], [place] [address]    Report Status 04/22/2021 FINAL  Final  Blood Culture (routine x 2)     Status: None   Collection Time: 04/16/21  9:45 PM   Specimen: BLOOD  Result Value Ref Range Status   Specimen Description   Final    BLOOD BLOOD RIGHT HAND Performed at [facility] [address], [place], [place] [address]    Special Requests   Final    BOTTLES DRAWN AEROBIC AND ANAEROBIC Blood Culture results may not be optimal due to an inadequate volume of blood received in culture bottles Performed at [facility] [address], [place], [place] [address]    Culture   Final    NO GROWTH 5 DAYS Performed at [facility], [place] [address] [place], [place] [address]    Report Status 04/22/2021 FINAL  Final  Resp Panel by RT-PCR (Flu A&B, Covid) Nasopharyngeal Swab     Status: None   Collection Time: 04/16/21 10:07 PM   Specimen: Nasopharyngeal Swab; Nasopharyngeal(NP) swabs in vial transport medium  Result Value Ref Range Status   SARS Coronavirus 2 by RT PCR NEGATIVE NEGATIVE Final    Comment: (NOTE) SARS-CoV-2 target nucleic acids are NOT DETECTED.  The SARS-CoV-2 RNA is generally detectable in upper  respiratory specimens during the acute phase of infection. The lowest concentration of SARS-CoV-2 viral copies this assay can detect is 138 copies/mL. A negative result does not preclude SARS-Cov-2 infection and should not be used as the sole basis for treatment or other patient management decisions. A negative result may occur with  improper specimen collection/handling, submission of specimen other than nasopharyngeal swab, presence of viral mutation(s) within the areas targeted by this assay, and inadequate number of viral copies(<138 copies/mL). A negative result must be combined with clinical observations, patient history, and epidemiological information. The expected result is Negative.  Fact Sheet for Patients:  EntrepreneurPulse.com.au  Fact Sheet for Healthcare Providers:  IncredibleEmployment.be  This test is no t yet approved or cleared by the [place] FDA and  has been authorized for detection and/or diagnosis of SARS-CoV-2 by FDA under an Emergency Use Authorization (EUA). This EUA will remain  in effect (meaning this test can be used) for the duration of the COVID-19 declaration under Section 564(b)(1) of the Act, 21 U.S.C.section 360bbb-3(b)(1), unless the authorization is terminated  or revoked sooner.       Influenza A by PCR NEGATIVE NEGATIVE Final   Influenza B by PCR NEGATIVE NEGATIVE Final    Comment: (NOTE) The Xpert Xpress SARS-CoV-2/FLU/RSV plus assay is intended as an aid in the diagnosis of influenza from Nasopharyngeal swab specimens and should not be used as a sole basis for treatment. Nasal washings and aspirates are unacceptable for Xpert Xpress SARS-CoV-2/FLU/RSV testing.  Fact Sheet for Patients: EntrepreneurPulse.com.au  Fact Sheet for Healthcare Providers: IncredibleEmployment.be  This test is not yet approved or cleared by the  [place] Architectural technologist and has been  authorized for detection and/or diagnosis of SARS-CoV-2 by FDA under an Print production planner (EUA). This EUA will remain in effect (meaning this test can be used) for the duration of the COVID-19 declaration under Section 564(b)(1) of the Act, 21 U.S.C. section 360bbb-3(b)(1), unless the authorization is terminated or revoked.  Performed at [facility], [place] [address] [place], [place] [address]   Urine Culture     Status: Abnormal   Collection Time: 04/16/21 10:07 PM   Specimen: In/Out Cath Urine  Result Value Ref Range Status   Specimen Description   Final    IN/OUT CATH URINE Performed at [facility] [address], [place], [place] [address]    Special Requests   Final    NONE Performed at [facility], [place] [address], [place], [place] [address]    Culture (A)  Final    30,000 COLONIES/mL MULTIPLE SPECIES PRESENT, SUGGEST RECOLLECTION   Report Status 04/18/2021 FINAL  Final  Respiratory (~20 pathogens) panel by PCR     Status: None   Collection Time: 04/18/21  6:17 PM   Specimen: Nasopharyngeal Swab; Respiratory  Result Value Ref Range Status   Adenovirus NOT DETECTED NOT DETECTED Final   Coronavirus 229E NOT DETECTED NOT DETECTED Final    Comment: (NOTE) The Coronavirus on the Respiratory Panel, DOES NOT test for the novel  Coronavirus (2019 nCoV)    Coronavirus HKU1 NOT DETECTED NOT DETECTED Final   Coronavirus NL63 NOT DETECTED NOT DETECTED Final   Coronavirus OC43 NOT DETECTED NOT DETECTED Final   Metapneumovirus NOT DETECTED NOT DETECTED Final   Rhinovirus / Enterovirus NOT DETECTED NOT DETECTED Final   Influenza A NOT DETECTED NOT DETECTED Final   Influenza B NOT DETECTED NOT DETECTED Final   Parainfluenza Virus 1 NOT DETECTED NOT DETECTED Final   Parainfluenza Virus 2 NOT DETECTED NOT DETECTED Final   Parainfluenza Virus 3 NOT DETECTED NOT DETECTED Final   Parainfluenza Virus 4 NOT DETECTED NOT  DETECTED Final   Respiratory Syncytial Virus NOT DETECTED NOT DETECTED Final   Bordetella pertussis NOT DETECTED NOT DETECTED Final   Bordetella Parapertussis NOT DETECTED NOT DETECTED Final   Chlamydophila pneumoniae NOT DETECTED NOT DETECTED Final   Mycoplasma pneumoniae NOT DETECTED NOT DETECTED Final    Comment: Performed at [facility], [place] [address] [place], [place] [address]    Procedures/Studies: [place] Elbow 2 Views Left  Result Date: 04/22/2021 CLINICAL DATA:  Fall.  Posterior elbow pain. EXAM: LEFT ELBOW - 2 VIEW COMPARISON:  None. FINDINGS: There is soft tissue swelling overlying the olecranon. The bone mineralization appears normal. No fracture or dislocation identified. IMPRESSION: Soft tissue swelling overlying the olecranon. No fracture. Electronically Signed   By: Kerby Moors M.D.   On: 04/22/2021 10:45   CT HEAD WO CONTRAST (5MM)  Result Date: 04/17/2021 CLINICAL DATA:  History of lung cancer and renal cancer presenting with fever and weakness. EXAM: CT HEAD WITHOUT CONTRAST TECHNIQUE: Contiguous axial images were obtained from the base of the skull through the vertex without intravenous contrast. COMPARISON:  Dec 19, 2016 FINDINGS: Brain: There is mild cerebral atrophy with widening of the extra-axial spaces and ventricular dilatation. There are areas of decreased attenuation within the white matter tracts of the supratentorial brain, consistent with microvascular disease changes. Vascular: No hyperdense vessel or unexpected calcification. Skull: Normal. Negative for fracture or focal lesion. Sinuses/Orbits: No acute finding. Other: None. IMPRESSION:  1. Generalized cerebral atrophy. 2. No acute intracranial abnormality. Electronically Signed   By: Virgina Norfolk M.D.   On: 04/17/2021 00:04   DG Chest Port 1 View  Result Date: 04/16/2021 CLINICAL DATA:  Questionable sepsis. Fever. Weakness. Pt has cancer pt receiving chemo. Pt has lung and renal cancer. H/o Diabetes,  HTN, Emphysema. EXAM: PORTABLE CHEST 1 VIEW COMPARISON:  Chest x-ray 01/26/2020, CT chest 08/30/2020, CT chest 12/03/2020 FINDINGS: The heart and mediastinal contours are within normal limits. Fullness of the right inferior hilar region consistent with known mass. Right base interstitial markings. No pulmonary edema. Persistent trace right pleural effusion no left pleural effusion. No pneumothorax. No acute osseous abnormality. IMPRESSION: Right base interstitial markings as well as paramediastinal/perihilar right opacity consistent with known mass. Persistent trace right pleural effusion. Electronically Signed   By: Iven Finn M.D.   On: 04/16/2021 22:54   ECHOCARDIOGRAM COMPLETE  Result Date: 04/20/2021    ECHOCARDIOGRAM REPORT   Patient Name:   Richard Navarro Date of Exam: 04/20/2021 Medical Rec #:  [id number]         Height:       75.0 in Accession #:    [id number]        Weight:       188.5 lb Date of Birth:  1946/04/12         BSA:          2.140 m Patient Age:    22 years          BP:           124/86 mmHg Patient Gender: M                 HR:           77 bpm. Exam Location:  Inpatient Procedure: 2D Echo, Cardiac Doppler and Color Doppler Indications:    dyspea  History:        Patient has no prior history of Echocardiogram examinations.                 COPD, Signs/Symptoms:Dyspnea; Risk Factors:Hypertension and                 Diabetes.  Sonographer:    MH Referring Phys: [place]  1. Left ventricular ejection fraction, by estimation, is 60 to 65%. The left ventricle has normal function. The left ventricle has no regional wall motion abnormalities. There is moderate concentric left ventricular hypertrophy. Left ventricular diastolic parameters were normal.  2. Right ventricular systolic function is normal. The right ventricular size is normal. Tricuspid regurgitation signal is inadequate for assessing PA pressure.  3. The mitral valve is normal in structure. No evidence of  mitral valve regurgitation. No evidence of mitral stenosis.  4. The aortic valve is tricuspid. There is mild calcification of the aortic valve. There is mild thickening of the aortic valve. Aortic valve regurgitation is trivial. Mild aortic valve sclerosis is present, with no evidence of aortic valve stenosis.  5. The inferior vena cava is normal in size with greater than 50% respiratory variability, suggesting right atrial pressure of 3 mmHg. Comparison(s): No prior Echocardiogram. Conclusion(s)/Recommendation(s): Otherwise normal echocardiogram, with minor abnormalities described in the report. FINDINGS  Left Ventricle: Left ventricular ejection fraction, by estimation, is 60 to 65%. The left ventricle has normal function. The left ventricle has no regional wall motion abnormalities. The left ventricular internal cavity size was normal in size. There is  moderate concentric left  ventricular hypertrophy. Left ventricular diastolic parameters were normal. Right Ventricle: The right ventricular size is normal. No increase in right ventricular wall thickness. Right ventricular systolic function is normal. Tricuspid regurgitation signal is inadequate for assessing PA pressure. Left Atrium: Left atrial size was normal in size. Right Atrium: Right atrial size was normal in size. Pericardium: Trivial pericardial effusion is present. Mitral Valve: The mitral valve is normal in structure. No evidence of mitral valve regurgitation. No evidence of mitral valve stenosis. Tricuspid Valve: The tricuspid valve is normal in structure. Tricuspid valve regurgitation is not demonstrated. No evidence of tricuspid stenosis. Aortic Valve: The aortic valve is tricuspid. There is mild calcification of the aortic valve. There is mild thickening of the aortic valve. Aortic valve regurgitation is trivial. Mild aortic valve sclerosis is present, with no evidence of aortic valve stenosis. Aortic valve mean gradient measures 4.0 mmHg. Aortic  valve peak gradient measures 5.9 mmHg. Aortic valve area, by VTI measures 3.77 cm. Pulmonic Valve: The pulmonic valve was not well visualized. Pulmonic valve regurgitation is not visualized. No evidence of pulmonic stenosis. Aorta: The aortic root, ascending aorta and aortic arch are all structurally normal, with no evidence of dilitation or obstruction. Venous: The inferior vena cava is normal in size with greater than 50% respiratory variability, suggesting right atrial pressure of 3 mmHg. IAS/Shunts: The atrial septum is grossly normal.  LEFT VENTRICLE PLAX 2D LVIDd:         3.50 cm     Diastology LVIDs:         2.30 cm     LV e' medial:    7.07 cm/s LV PW:         1.40 cm     LV E/e' medial:  10.7 LV IVS:        1.60 cm     LV e' lateral:   5.11 cm/s LVOT diam:     2.40 cm     LV E/e' lateral: 14.8 LV SV:         108 LV SV Index:   51 LVOT Area:     4.52 cm  LV Volumes (MOD) LV vol d, MOD A4C: 49.5 ml LV vol s, MOD A4C: 20.7 ml LV SV MOD A4C:     49.5 ml RIGHT VENTRICLE             IVC RV S prime:     10.40 cm/s  IVC diam: 1.10 cm TAPSE (M-mode): 1.9 cm LEFT ATRIUM           Index       RIGHT ATRIUM          Index LA diam:      3.00 cm 1.40 cm/m  RA Area:     7.69 cm LA Vol (A2C): 26.7 ml 12.48 ml/m RA Volume:   14.30 ml 6.68 ml/m LA Vol (A4C): 27.6 ml 12.90 ml/m  AORTIC VALVE AV Area (Vmax):    4.45 cm AV Area (Vmean):   3.71 cm AV Area (VTI):     3.77 cm AV Vmax:           121.00 cm/s AV Vmean:          99.800 cm/s AV VTI:            0.287 m AV Peak Grad:      5.9 mmHg AV Mean Grad:      4.0 mmHg LVOT Vmax:         119.00 cm/s LVOT  Vmean:        81.800 cm/s LVOT VTI:          0.239 m LVOT/AV VTI ratio: 0.83  AORTA Ao Root diam: 3.00 cm Ao Asc diam:  3.10 cm MITRAL VALVE MV Area (PHT): 3.67 cm     SHUNTS MV E velocity: 75.80 cm/s   Systemic VTI:  0.24 m MV A velocity: 100.00 cm/s  Systemic Diam: 2.40 cm MV E/A ratio:  0.76 Buford Dresser MD Electronically signed by Buford Dresser MD  Signature Date/Time: 04/20/2021/5:50:21 PM    Final    DG ESOPHAGUS W SINGLE CM (SOL OR THIN BA)  Result Date: 04/19/2021 CLINICAL DATA:  Dysphagia for 1 year. Prior thoracic radiation therapy and prior chemotherapy. EXAM: ESOPHOGRAM/BARIUM SWALLOW TECHNIQUE: Single contrast examination was performed using  thin barium. FLUOROSCOPY TIME:  Fluoroscopy Time:  2 minutes, 36 seconds Radiation Exposure Index (if provided by the fluoroscopic device): 25.7 mGy Number of Acquired Spot Images: 0 COMPARISON:  Multiple exams, including chest CT 12/03/2020 FINDINGS: Mild smooth circumferential narrowing of the barium column at the thoracic inlet for example on image 1 series 2 and on image 19 series 3, on some swallows such as on image 23 of series 3 there is a suggestion of a small residuum of contrast in this vicinity potentially from a small diverticulum or ulceration. Primary peristaltic waves are intact to the level of the mid to distal esophagus on 4/4 swallows. In the distal esophagus, there is secondary contraction in some mild tertiary contractions. Distal esophageal fold thickening is present for example on image 35 series 4 raising suspicion for esophagitis. Mild tortuosity of the distal esophagus potentially at least partially attributable to the moderate-sized hiatal hernia. The patient swallowed a 13 mm barium pill with which transiently impacted in the distal esophagus but which subsequently cleared with a second swallow of water. IMPRESSION: 1. At the junction of the cervical and thoracic segments of the esophagus, there is some focal smooth narrowing as on image 19 series 3, with a small persistent contrast collection in this vicinity on several swallows potentially representing a traction diverticulum or small ulceration. I have scrutinized this region on the prior CT of 12/03/2020 but I do not see a well-defined cause for this appearance on that study. The possibility of a somewhat high in position peptic  stricture is raised. This seems higher in position than would be expected for the patient's prior radiation therapy. 2. Distal esophageal fold thickening raises suspicion for esophagitis. 3. Disruption of primary peristaltic waves in the distal esophagus although there were propulsive secondary contractions and some tertiary contractions in the distal esophagus which were successful clearing the boluses. 4. Moderate-sized type 1 hiatal hernia resulting in some exaggerated curvature of the distal esophagus. 5. If clinically warranted, endoscopy could be utilized to confirm further assess the above findings. Electronically Signed   By: Van Clines M.D.   On: 04/19/2021 15:25    Labs: BNP (last 3 results) No results for input(s): BNP in the last 8760 hours. Basic Metabolic Panel: Recent Labs  Lab 04/17/21 0817 04/18/21 0233 04/20/21 0406 04/21/21 0809 04/22/21 0350  NA 138 139 142 141 138  K 3.5 3.8 3.6 3.5 3.5  CL 109 107 112* 110 107  CO2 21* 25 23 25 26   GLUCOSE 110* 129* 112* 105* 102*  BUN 20 18 17 17 17   CREATININE 1.70* 1.63* 1.44* 1.65* 1.58*  CALCIUM 8.5* 8.9 8.9 8.9 8.5*   Liver Function Tests: Recent  Labs  Lab 04/16/21 2144 04/17/21 0817  AST 20 13*  ALT 13 10  ALKPHOS 67 44  BILITOT 0.7 0.8  PROT 7.8 6.1*  ALBUMIN 3.8 3.1*   No results for input(s): LIPASE, AMYLASE in the last 168 hours. No results for input(s): AMMONIA in the last 168 hours. CBC: Recent Labs  Lab 04/16/21 2144 04/17/21 0817 04/18/21 0233 04/20/21 0406 04/21/21 0809 04/22/21 0350  WBC 6.5 6.8 8.3 3.2* 3.5* 3.9*  NEUTROABS 5.8  --   --  2.2  --   --   HGB 11.2* 9.4* 10.6* 7.8* 9.5* 9.6*  HCT 32.6* 27.9* 31.0* 23.0* 27.6* 27.9*  MCV 98.5 100.4* 98.7 99.6 98.6 97.2  PLT 156 131* 152 125* 155 173   Cardiac Enzymes: No results for input(s): CKTOTAL, CKMB, CKMBINDEX, TROPONINI in the last 168 hours. BNP: Invalid input(s): POCBNP CBG: Recent Labs  Lab 04/21/21 0740 04/21/21 1203  04/21/21 1556 04/21/21 2210 04/22/21 0742  GLUCAP 97 169* 120* 176* 102*   D-Dimer No results for input(s): DDIMER in the last 72 hours. Hgb A1c No results for input(s): HGBA1C in the last 72 hours. Lipid Profile No results for input(s): CHOL, HDL, LDLCALC, TRIG, CHOLHDL, LDLDIRECT in the last 72 hours. Thyroid function studies No results for input(s): TSH, T4TOTAL, T3FREE, THYROIDAB in the last 72 hours.  Invalid input(s): FREET3 Anemia work up No results for input(s): VITAMINB12, FOLATE, FERRITIN, TIBC, IRON, RETICCTPCT in the last 72 hours. Urinalysis    Component Value Date/Time   COLORURINE YELLOW 04/16/2021 2207   APPEARANCEUR CLEAR 04/16/2021 2207   LABSPEC 1.012 04/16/2021 2207   LABSPEC 1.015 04/26/2017 1452   PHURINE 7.0 04/16/2021 2207   GLUCOSEU NEGATIVE 04/16/2021 2207   GLUCOSEU Negative 04/26/2017 1452   HGBUR NEGATIVE 04/16/2021 2207   BILIRUBINUR NEGATIVE 04/16/2021 2207   BILIRUBINUR N 03/04/2018 1657   BILIRUBINUR Negative 04/26/2017 1452   KETONESUR NEGATIVE 04/16/2021 2207   PROTEINUR NEGATIVE 04/16/2021 2207   UROBILINOGEN 0.2 03/04/2018 1657   UROBILINOGEN 0.2 04/26/2017 1452   NITRITE NEGATIVE 04/16/2021 2207   LEUKOCYTESUR NEGATIVE 04/16/2021 2207   LEUKOCYTESUR Negative 04/26/2017 1452   Sepsis Labs Invalid input(s): PROCALCITONIN,  WBC,  LACTICIDVEN Microbiology Recent Results (from the past 240 hour(s))  Blood Culture (routine x 2)     Status: None   Collection Time: 04/16/21  9:44 PM   Specimen: BLOOD  Result Value Ref Range Status   Specimen Description   Final    BLOOD RIGHT ANTECUBITAL Performed at [facility], [place] [address] [place], [place] [address]    Special Requests   Final    BOTTLES DRAWN AEROBIC AND ANAEROBIC Blood Culture adequate volume Performed at [facility] [address] [place], [place] [address]    Culture   Final    NO GROWTH 5 DAYS Performed at [facility], [place] [address], [place], [place] [address]    Report Status 04/22/2021 FINAL  Final  Blood Culture (routine x 2)     Status: None   Collection Time: 04/16/21  9:45 PM   Specimen: BLOOD  Result Value Ref Range Status   Specimen Description   Final    BLOOD BLOOD RIGHT HAND Performed at [facility] [address], [place], [place] [address]    Special Requests   Final    BOTTLES DRAWN AEROBIC AND ANAEROBIC Blood Culture results may not be optimal due to an inadequate volume of blood received in  culture bottles Performed at [facility] [address], [place], [place] [address]    Culture   Final    NO GROWTH 5 DAYS Performed at [facility], [place] [address] [place], [place] [address]    Report Status 04/22/2021 FINAL  Final  Resp Panel by RT-PCR (Flu A&B, Covid) Nasopharyngeal Swab     Status: None   Collection Time: 04/16/21 10:07 PM   Specimen: Nasopharyngeal Swab; Nasopharyngeal(NP) swabs in vial transport medium  Result Value Ref Range Status   SARS Coronavirus 2 by RT PCR NEGATIVE NEGATIVE Final    Comment: (NOTE) SARS-CoV-2 target nucleic acids are NOT DETECTED.  The SARS-CoV-2 RNA is generally detectable in upper respiratory specimens during the acute phase of infection. The lowest concentration of SARS-CoV-2 viral copies this assay can detect is 138 copies/mL. A negative result does not preclude SARS-Cov-2 infection and should not be used as the sole basis for treatment or other patient management decisions. A negative result may occur with  improper specimen collection/handling, submission of specimen other than nasopharyngeal swab, presence of viral mutation(s) within the areas targeted by this assay, and inadequate number of viral copies(<138 copies/mL). A negative result must be combined with clinical observations, patient history, and epidemiological information. The expected result is Negative.  Fact Sheet for  Patients:  EntrepreneurPulse.com.au  Fact Sheet for Healthcare Providers:  IncredibleEmployment.be  This test is no t yet approved or cleared by the [place] FDA and  has been authorized for detection and/or diagnosis of SARS-CoV-2 by FDA under an Emergency Use Authorization (EUA). This EUA will remain  in effect (meaning this test can be used) for the duration of the COVID-19 declaration under Section 564(b)(1) of the Act, 21 U.S.C.section 360bbb-3(b)(1), unless the authorization is terminated  or revoked sooner.       Influenza A by PCR NEGATIVE NEGATIVE Final   Influenza B by PCR NEGATIVE NEGATIVE Final    Comment: (NOTE) The Xpert Xpress SARS-CoV-2/FLU/RSV plus assay is intended as an aid in the diagnosis of influenza from Nasopharyngeal swab specimens and should not be used as a sole basis for treatment. Nasal washings and aspirates are unacceptable for Xpert Xpress SARS-CoV-2/FLU/RSV testing.  Fact Sheet for Patients: EntrepreneurPulse.com.au  Fact Sheet for Healthcare Providers: IncredibleEmployment.be  This test is not yet approved or cleared by the [place] FDA and has been authorized for detection and/or diagnosis of SARS-CoV-2 by FDA under an Emergency Use Authorization (EUA). This EUA will remain in effect (meaning this test can be used) for the duration of the COVID-19 declaration under Section 564(b)(1) of the Act, 21 U.S.C. section 360bbb-3(b)(1), unless the authorization is terminated or revoked.  Performed at [facility], [place] [address], [place], [place] [address]   Urine Culture     Status: Abnormal   Collection Time: 04/16/21 10:07 PM   Specimen: In/Out Cath Urine  Result Value Ref Range Status   Specimen Description   Final    IN/OUT CATH URINE Performed at [facility] [address] [place], [place] [address]    Special  Requests   Final    NONE Performed at [facility], [place] [address] [place],  [address]    Culture (A)  Final    30,000 COLONIES/mL MULTIPLE SPECIES PRESENT, SUGGEST RECOLLECTION   Report Status 04/18/2021 FINAL  Final  Respiratory (~20 pathogens) panel by PCR     Status: None   Collection Time: 04/18/21  6:17 PM   Specimen: Nasopharyngeal Swab; Respiratory  Result Value Ref Range Status   Adenovirus NOT DETECTED NOT DETECTED Final   Coronavirus 229E NOT DETECTED NOT DETECTED Final    Comment: (NOTE) The Coronavirus on the Respiratory Panel, DOES NOT test for the novel  Coronavirus (2019 nCoV)    Coronavirus HKU1 NOT DETECTED NOT DETECTED Final   Coronavirus NL63 NOT DETECTED NOT DETECTED Final   Coronavirus OC43 NOT DETECTED NOT DETECTED Final   Metapneumovirus NOT DETECTED NOT DETECTED Final   Rhinovirus / Enterovirus NOT DETECTED NOT DETECTED Final   Influenza A NOT DETECTED NOT DETECTED Final   Influenza B NOT DETECTED NOT DETECTED Final   Parainfluenza Virus 1 NOT DETECTED NOT DETECTED Final   Parainfluenza Virus 2 NOT DETECTED NOT DETECTED Final   Parainfluenza Virus 3 NOT DETECTED NOT DETECTED Final   Parainfluenza Virus 4 NOT DETECTED NOT DETECTED Final   Respiratory Syncytial Virus NOT DETECTED NOT DETECTED Final   Bordetella pertussis NOT DETECTED NOT DETECTED Final   Bordetella Parapertussis NOT DETECTED NOT DETECTED Final   Chlamydophila pneumoniae NOT DETECTED NOT DETECTED Final   Mycoplasma pneumoniae NOT DETECTED NOT DETECTED Final    Comment: Performed at [facility], [place]. [address] [place], [place] [address]     Time coordinating discharge: 25 minutes  SIGNED: Antonieta Pert, MD  [facility] 04/22/2021, 11:02 AM  If 7PM-7AM, please contact night-coverage [email or web address]

## 2021-04-22 NOTE — Progress Notes (Signed)
Patient discharged home with wife.  Discharged instructions reviewed with pt/wife, per CN.  Valuables, belongings, and home O2 sent with patient.  Patient transported out via w/c.  Escorted per NT.  No s/s of distress noted or reported upon his departure.

## 2021-04-26 ENCOUNTER — Other Ambulatory Visit (HOSPITAL_COMMUNITY): Payer: Self-pay

## 2021-04-26 ENCOUNTER — Telehealth: Payer: Self-pay

## 2021-04-26 ENCOUNTER — Other Ambulatory Visit: Payer: Self-pay | Admitting: Internal Medicine

## 2021-04-26 DIAGNOSIS — C7A8 Other malignant neuroendocrine tumors: Secondary | ICD-10-CM

## 2021-04-26 MED ORDER — CAPECITABINE 500 MG PO TABS
ORAL_TABLET | ORAL | 2 refills | Status: DC
  Filled 2021-04-26: qty 84, 14d supply, fill #0

## 2021-04-26 NOTE — Telephone Encounter (Signed)
Pts caregiver, [name], called wanting to know if the pt should restart his tx given he was I the hospital recently.  Discussed this with Dr. [name] who wants the pt to hold his tx until he sees him on 9/12.  I have called [name] back and advised of this. She expressed understanding of this information.

## 2021-04-28 ENCOUNTER — Other Ambulatory Visit (HOSPITAL_COMMUNITY): Payer: Self-pay

## 2021-04-29 ENCOUNTER — Other Ambulatory Visit (HOSPITAL_COMMUNITY): Payer: Self-pay

## 2021-04-29 ENCOUNTER — Inpatient Hospital Stay: Payer: Medicare Other | Attending: Internal Medicine

## 2021-04-29 ENCOUNTER — Ambulatory Visit (HOSPITAL_COMMUNITY)
Admission: RE | Admit: 2021-04-29 | Discharge: 2021-04-29 | Disposition: A | Discharge status: Home / Self Care | Payer: Medicare Other | Source: Ambulatory Visit | Attending: Internal Medicine | Admitting: Internal Medicine

## 2021-04-29 ENCOUNTER — Other Ambulatory Visit: Payer: Self-pay

## 2021-04-29 DIAGNOSIS — J439 Emphysema, unspecified: Secondary | ICD-10-CM | POA: Diagnosis not present

## 2021-04-29 DIAGNOSIS — N189 Chronic kidney disease, unspecified: Secondary | ICD-10-CM | POA: Insufficient documentation

## 2021-04-29 DIAGNOSIS — I700 Atherosclerosis of aorta: Secondary | ICD-10-CM | POA: Diagnosis not present

## 2021-04-29 DIAGNOSIS — Z79899 Other long term (current) drug therapy: Secondary | ICD-10-CM | POA: Insufficient documentation

## 2021-04-29 DIAGNOSIS — J90 Pleural effusion, not elsewhere classified: Secondary | ICD-10-CM | POA: Diagnosis not present

## 2021-04-29 DIAGNOSIS — C7B02 Secondary carcinoid tumors of liver: Secondary | ICD-10-CM | POA: Insufficient documentation

## 2021-04-29 DIAGNOSIS — C7B03 Secondary carcinoid tumors of bone: Secondary | ICD-10-CM | POA: Insufficient documentation

## 2021-04-29 DIAGNOSIS — E785 Hyperlipidemia, unspecified: Secondary | ICD-10-CM | POA: Insufficient documentation

## 2021-04-29 DIAGNOSIS — E1122 Type 2 diabetes mellitus with diabetic chronic kidney disease: Secondary | ICD-10-CM | POA: Insufficient documentation

## 2021-04-29 DIAGNOSIS — C3490 Malignant neoplasm of unspecified part of unspecified bronchus or lung: Secondary | ICD-10-CM | POA: Diagnosis not present

## 2021-04-29 DIAGNOSIS — C3431 Malignant neoplasm of lower lobe, right bronchus or lung: Secondary | ICD-10-CM

## 2021-04-29 DIAGNOSIS — N200 Calculus of kidney: Secondary | ICD-10-CM | POA: Diagnosis not present

## 2021-04-29 DIAGNOSIS — I1310 Hypertensive heart and chronic kidney disease without heart failure, with stage 1 through stage 4 chronic kidney disease, or unspecified chronic kidney disease: Secondary | ICD-10-CM | POA: Insufficient documentation

## 2021-04-29 DIAGNOSIS — K449 Diaphragmatic hernia without obstruction or gangrene: Secondary | ICD-10-CM | POA: Diagnosis not present

## 2021-04-29 DIAGNOSIS — C7A090 Malignant carcinoid tumor of the bronchus and lung: Secondary | ICD-10-CM | POA: Insufficient documentation

## 2021-04-29 LAB — CMP (CANCER CENTER ONLY)
ALT: 14 U/L (ref 0–44)
AST: 11 U/L — ABNORMAL LOW (ref 15–41)
Albumin: 3.4 g/dL — ABNORMAL LOW (ref 3.5–5.0)
Alkaline Phosphatase: 74 U/L (ref 38–126)
Anion gap: 11 (ref 5–15)
BUN: 20 mg/dL (ref 8–23)
CO2: 22 mmol/L (ref 22–32)
Calcium: 9.6 mg/dL (ref 8.9–10.3)
Chloride: 105 mmol/L (ref 98–111)
Creatinine: 1.62 mg/dL — ABNORMAL HIGH (ref 0.61–1.24)
GFR, Estimated: 43 mL/min — ABNORMAL LOW (ref >60)
Glucose, Bld: 165 mg/dL — ABNORMAL HIGH (ref 70–99)
Potassium: 4.4 mmol/L (ref 3.5–5.1)
Sodium: 138 mmol/L (ref 135–145)
Total Bilirubin: 0.3 mg/dL (ref 0.3–1.2)
Total Protein: 7.8 g/dL (ref 6.5–8.1)

## 2021-04-29 LAB — CBC WITH DIFFERENTIAL (CANCER CENTER ONLY)
Abs Immature Granulocytes: 0.02 10*3/uL (ref 0.00–0.07)
Basophils Absolute: 0 10*3/uL (ref 0.0–0.1)
Basophils Relative: 0 %
Eosinophils Absolute: 0.1 10*3/uL (ref 0.0–0.5)
Eosinophils Relative: 1 %
HCT: 32.1 % — ABNORMAL LOW (ref 39.0–52.0)
Hemoglobin: 10.8 g/dL — ABNORMAL LOW (ref 13.0–17.0)
Immature Granulocytes: 0 %
Lymphocytes Relative: 18 %
Lymphs Abs: 0.8 10*3/uL (ref 0.7–4.0)
MCH: 32.9 pg (ref 26.0–34.0)
MCHC: 33.6 g/dL (ref 30.0–36.0)
MCV: 97.9 fL (ref 80.0–100.0)
Monocytes Absolute: 0.3 10*3/uL (ref 0.1–1.0)
Monocytes Relative: 7 %
Neutro Abs: 3.5 10*3/uL (ref 1.7–7.7)
Neutrophils Relative %: 74 %
Platelet Count: 245 10*3/uL (ref 150–400)
RBC: 3.28 MIL/uL — ABNORMAL LOW (ref 4.22–5.81)
RDW: 14.6 % (ref 11.5–15.5)
WBC Count: 4.8 10*3/uL (ref 4.0–10.5)
nRBC: 0 % (ref 0.0–0.2)

## 2021-05-02 ENCOUNTER — Inpatient Hospital Stay (HOSPITAL_BASED_OUTPATIENT_CLINIC_OR_DEPARTMENT_OTHER): Payer: Medicare Other | Admitting: Internal Medicine

## 2021-05-02 ENCOUNTER — Other Ambulatory Visit: Payer: Self-pay

## 2021-05-02 VITALS — BP 119/69 | HR 86 | Temp 97.6°F | Resp 18 | Ht 75.0 in | Wt 178.7 lb

## 2021-05-02 DIAGNOSIS — Z79899 Other long term (current) drug therapy: Secondary | ICD-10-CM | POA: Diagnosis not present

## 2021-05-02 DIAGNOSIS — C3431 Malignant neoplasm of lower lobe, right bronchus or lung: Secondary | ICD-10-CM

## 2021-05-02 DIAGNOSIS — E785 Hyperlipidemia, unspecified: Secondary | ICD-10-CM | POA: Diagnosis not present

## 2021-05-02 DIAGNOSIS — C3491 Malignant neoplasm of unspecified part of right bronchus or lung: Secondary | ICD-10-CM

## 2021-05-02 DIAGNOSIS — N189 Chronic kidney disease, unspecified: Secondary | ICD-10-CM | POA: Diagnosis not present

## 2021-05-02 DIAGNOSIS — E1122 Type 2 diabetes mellitus with diabetic chronic kidney disease: Secondary | ICD-10-CM | POA: Diagnosis not present

## 2021-05-02 DIAGNOSIS — C7A090 Malignant carcinoid tumor of the bronchus and lung: Secondary | ICD-10-CM | POA: Diagnosis not present

## 2021-05-02 DIAGNOSIS — C7B03 Secondary carcinoid tumors of bone: Secondary | ICD-10-CM | POA: Diagnosis not present

## 2021-05-02 DIAGNOSIS — C7B02 Secondary carcinoid tumors of liver: Secondary | ICD-10-CM | POA: Diagnosis not present

## 2021-05-02 DIAGNOSIS — Z5111 Encounter for antineoplastic chemotherapy: Secondary | ICD-10-CM

## 2021-05-02 DIAGNOSIS — I1310 Hypertensive heart and chronic kidney disease without heart failure, with stage 1 through stage 4 chronic kidney disease, or unspecified chronic kidney disease: Secondary | ICD-10-CM | POA: Diagnosis not present

## 2021-05-02 NOTE — Progress Notes (Signed)
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 propulsive secondary contractions and some tertiary contractions in the distal esophagus which were successful clearing the boluses. 4. Moderate-sized type 1 hiatal hernia resulting in some exaggerated curvature of the distal esophagus. 5. If clinically warranted, endoscopy could be utilized to confirm further assess the above findings. Electronically Signed   By: Van Clines M.D.   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 The patient was started on treatment with Afinitor 10 mg by mouth daily status post 2 months of treatment. This was followed by reduction of his dose to 7.5 mg by mouth daily status post 24 months and he is tolerating this dose much better.  He also underwent radio-embolization of metastatic lesion in the liver by interventional radiology on Jan 09, 2018. The patient has been tolerating his treatment with Afinitor fairly well but recently  admitted to the hospital with acute renal insufficiency.   The patient has been off treatment with Afinitor for the last 3 months. He resumed his treatment with Afinitor at a dose of 7.5 mg p.o. daily but unfortunately the patient continues to have evidence for disease progression in addition to renal insufficiency. I recommended for the patient to discontinue his current treatment with Afinitor at this point. The patient is currently undergoing treatment with systemic chemotherapy with Xeloda 750 mg/M2 twice daily for 14 days in addition to Temodar 200 mg/M2 on days 10-14 every 4 weeks.  Status post 18 months of treatment. The patient has been tolerating her treatment well with no concerning adverse effect except for mild fatigue. He had repeat CT scan of the chest, abdomen pelvis performed recently.  I personally and independently reviewed the scans and discussed the results with the patient and his wife. His scan showed no concerning findings for disease progression. The patient has been dealing with aspiration pneumonia at more frequent basis in addition to dysphagia recently. I recommended for him to hold his treatment with chemotherapy for the next few months until his condition is more stable. I will see him back for follow-up visit in 3 months for evaluation with repeat CT scan of the chest, abdomen pelvis for restaging of his disease. For the renal insufficiency he is currently followed by nephrology. The patient was advised to call immediately if he has any other concerning symptoms in the interval.  The patient and his wife are in agreement with the current plan.  The patient voices understanding of current disease status and treatment options and is in agreement with the current care plan. All questions were answered. The patient knows to call the clinic with any problems, questions or concerns. We can certainly see the patient much sooner if necessary.  Disclaimer: This note was  dictated with voice recognition software. Similar sounding words can inadvertently be transcribed and may not be corrected upon review.

## 2021-05-03 ENCOUNTER — Ambulatory Visit (INDEPENDENT_AMBULATORY_CARE_PROVIDER_SITE_OTHER): Payer: Medicare Other | Admitting: Family Medicine

## 2021-05-03 ENCOUNTER — Encounter: Payer: Self-pay | Admitting: Family Medicine

## 2021-05-03 ENCOUNTER — Other Ambulatory Visit: Payer: Self-pay

## 2021-05-03 VITALS — BP 124/77 | HR 72 | Temp 98.7°F | Ht 75.0 in | Wt 178.8 lb

## 2021-05-03 DIAGNOSIS — E1159 Type 2 diabetes mellitus with other circulatory complications: Secondary | ICD-10-CM

## 2021-05-03 DIAGNOSIS — R2689 Other abnormalities of gait and mobility: Secondary | ICD-10-CM

## 2021-05-03 DIAGNOSIS — I152 Hypertension secondary to endocrine disorders: Secondary | ICD-10-CM

## 2021-05-03 DIAGNOSIS — N1832 Chronic kidney disease, stage 3b: Secondary | ICD-10-CM

## 2021-05-03 DIAGNOSIS — E1151 Type 2 diabetes mellitus with diabetic peripheral angiopathy without gangrene: Secondary | ICD-10-CM

## 2021-05-03 DIAGNOSIS — M1A00X0 Idiopathic chronic gout, unspecified site, without tophus (tophi): Secondary | ICD-10-CM | POA: Diagnosis not present
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
## 2021-05-03 NOTE — Patient Instructions (Addendum)
Health Maintenance Due  Topic Date Due   Zoster Vaccines- Shingrix (1 of 2)   Please check with your pharmacy to see if they have the shingrix vaccine. If they do- please get this immunization and update [place] by phone call or mychart with dates you receive the vaccine . This will be cheaper at the pharmacy.  Never done   OPHTHALMOLOGY EXAM Patient will get this scheduled.  09/07/2020   INFLUENZA VACCINE   Schedule a Nurse's visit in about one month for your flu shot.  You could also get this at your local pharmacy as well as the Omicron variant booster - both of these vaccinations can be done together. 03/21/2021   TETANUS/TDAP   Recommend doing this at your local pharmacy. 03/26/2021   Schedule PT visit at the desk before you leave today.  I do recommend to start using your oxygen at home to keep your oxygen level above 94%. You should be using this with activities.  If you systolic blood pressure persistently gets above 140, please let me know.  Recommended follow up: Return in about 4 months (around 09/02/2021) for a follow-up or sooner if needed.

## 2021-05-04 ENCOUNTER — Telehealth: Payer: Self-pay | Admitting: Internal Medicine

## 2021-05-04 NOTE — Telephone Encounter (Signed)
Scheduled appointment per 09/12 los. Patient is aware.

## 2021-05-10 ENCOUNTER — Ambulatory Visit: Payer: TRICARE For Life (TFL) | Admitting: Physician Assistant

## 2021-05-12 ENCOUNTER — Encounter: Payer: Self-pay | Admitting: Physical Therapy

## 2021-05-12 ENCOUNTER — Other Ambulatory Visit: Payer: Self-pay | Admitting: Medical Oncology

## 2021-05-12 ENCOUNTER — Other Ambulatory Visit: Payer: Self-pay

## 2021-05-12 ENCOUNTER — Ambulatory Visit (INDEPENDENT_AMBULATORY_CARE_PROVIDER_SITE_OTHER): Payer: Medicare Other | Admitting: Physical Therapy

## 2021-05-12 ENCOUNTER — Telehealth: Payer: Self-pay | Admitting: Medical Oncology

## 2021-05-12 DIAGNOSIS — R262 Difficulty in walking, not elsewhere classified: Secondary | ICD-10-CM

## 2021-05-12 DIAGNOSIS — M6281 Muscle weakness (generalized): Secondary | ICD-10-CM | POA: Diagnosis not present

## 2021-05-12 DIAGNOSIS — R112 Nausea with vomiting, unspecified: Secondary | ICD-10-CM

## 2021-05-12 NOTE — Telephone Encounter (Signed)
[facility] is requesting refill for zofran. I called [name] and she stated pt does not need Zofran .   I reiterated to her that pt needs to stop Xeloda and Temodar for now until he sees [place] again.
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
## 2021-05-13 ENCOUNTER — Encounter: Payer: Self-pay | Admitting: Podiatry

## 2021-05-13 ENCOUNTER — Ambulatory Visit (INDEPENDENT_AMBULATORY_CARE_PROVIDER_SITE_OTHER): Payer: Medicare Other | Admitting: Podiatry

## 2021-05-13 DIAGNOSIS — M79675 Pain in left toe(s): Secondary | ICD-10-CM | POA: Diagnosis not present

## 2021-05-13 DIAGNOSIS — B351 Tinea unguium: Secondary | ICD-10-CM | POA: Diagnosis not present

## 2021-05-13 DIAGNOSIS — M79674 Pain in right toe(s): Secondary | ICD-10-CM | POA: Diagnosis not present

## 2021-05-13 DIAGNOSIS — E1151 Type 2 diabetes mellitus with diabetic peripheral angiopathy without gangrene: Secondary | ICD-10-CM

## 2021-05-13 NOTE — Patient Instructions (Signed)
Apply Neosporin Cream to left 2nd toe once daily for one week. We will discuss on next visit whether you'd like to be referred to Dr. Sherryle Lis in our office for in-office flexor tenotomy of the left 2nd toe to help the toe straighten up.

## 2021-05-17 NOTE — Progress Notes (Signed)
Subjective: Richard Navarro is a pleasant 75 y.o. male patient seen today for at-risk foot care. He has h/o NIDDM with PAD. Patient is seen routinely for painful thick toenails that are difficult to trim. Pain interferes with ambulation. Aggravating factors include wearing enclosed shoe gear. Pain is relieved with periodic professional debridement.  He states his blood glucose was 128 mg/dl on yesterday.  He voices no new pedal problems on today's visit.  He does have concern about bills he is receiving and is wondering if his claims are being sent to [place].  PCP is [name], MD. Last visit was: 05/03/2021.   Allergies  Allergen Reactions   Lisinopril Swelling    Angioedema- on this and afinitor same time   Simvastatin Other (See Comments)    Joint ache    Objective: Physical Exam  General: Richard Navarro is a pleasant 75 y.o. African American male, WD, WN in NAD. AAO x 3.   Vascular:  Capillary fill time to digits <3 seconds b/l lower extremities. Faintly palpable DP pulse(s) b/l lower extremities. Nonpalpable PT pulse(s) b/l. Pedal hair sparse. Lower extremity skin temperature gradient within normal limits. No edema noted b/l lower extremities.  Dermatological:  Pedal skin is thin shiny, atrophic b/l lower extremities. No open wounds b/l lower extremities No interdigital macerations b/l lower extremities Toenails 1-5 b/l elongated, discolored, dystrophic, thickened, crumbly with subungual debris and tenderness to dorsal palpation.  Left 2nd toe with distal tip lesion subungually with pinpoint bleeding. No erythema, no edema, no drainage, no fluctuance.  Musculoskeletal:  Normal muscle strength 5/5 to all lower extremity muscle groups bilaterally. No pain crepitus or joint limitation noted with ROM b/l lower extremities. Clawtoe deformity 2-5 bilaterally.  Neurological:  Protective sensation intact 5/5 intact bilaterally with 10g monofilament  b/l.  Assessment and Plan:  1. Pain due to onychomycosis of toenails of both feet   2. Type II diabetes mellitus with peripheral circulatory disorder (HCC)    -Examined patient. -Discussed left 2nd toe deformity. He is to apply Neosporin Cream to left 2nd toe once daily for one week. We discussed in-office flexor tenotomy procedure to resolve toe deformity. He will think about it.. -Continue diabetic foot care principles: inspect feet daily, monitor glucose as recommended by PCP and/or Endocrinologist, and follow prescribed diet per PCP, Endocrinologist and/or dietician. -Patient to continue soft, supportive shoe gear daily. -Toenails 1-5 b/l were debrided in length and girth with sterile nail nippers and dremel. Pinpoint bleeding left 2nd toe addressed with Lumicain Hemostatic Solution. Cleansed with alcohol. TAO and band-aid applied. -Patient to report any pedal injuries to medical professional immediately. -Patient/POA to call should there be question/concern in the interim.  Return in about 3 months (around 08/12/2021).  Marzetta Board, DPM

## 2021-05-23 ENCOUNTER — Ambulatory Visit (INDEPENDENT_AMBULATORY_CARE_PROVIDER_SITE_OTHER): Payer: Medicare Other | Admitting: Physical Therapy

## 2021-05-23 ENCOUNTER — Other Ambulatory Visit: Payer: Self-pay

## 2021-05-23 ENCOUNTER — Encounter: Payer: Self-pay | Admitting: Physical Therapy

## 2021-05-23 DIAGNOSIS — M6281 Muscle weakness (generalized): Secondary | ICD-10-CM

## 2021-05-23 DIAGNOSIS — R262 Difficulty in walking, not elsewhere classified: Secondary | ICD-10-CM | POA: Diagnosis not present

## 2021-05-26 ENCOUNTER — Other Ambulatory Visit (HOSPITAL_COMMUNITY): Payer: Self-pay

## 2021-05-29 ENCOUNTER — Encounter: Payer: Self-pay | Admitting: Physical Therapy
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
## 2021-05-30 ENCOUNTER — Ambulatory Visit (INDEPENDENT_AMBULATORY_CARE_PROVIDER_SITE_OTHER): Payer: Medicare Other | Admitting: Physical Therapy

## 2021-05-30 ENCOUNTER — Other Ambulatory Visit: Payer: Self-pay

## 2021-05-30 DIAGNOSIS — R262 Difficulty in walking, not elsewhere classified: Secondary | ICD-10-CM | POA: Diagnosis not present

## 2021-05-30 DIAGNOSIS — M6281 Muscle weakness (generalized): Secondary | ICD-10-CM | POA: Diagnosis not present

## 2021-06-01 ENCOUNTER — Ambulatory Visit (INDEPENDENT_AMBULATORY_CARE_PROVIDER_SITE_OTHER): Payer: Medicare Other | Admitting: Gastroenterology

## 2021-06-01 ENCOUNTER — Encounter: Payer: Self-pay | Admitting: Gastroenterology

## 2021-06-01 VITALS — BP 124/80 | HR 76 | Ht 75.0 in | Wt 179.8 lb

## 2021-06-01 DIAGNOSIS — R1314 Dysphagia, pharyngoesophageal phase: Secondary | ICD-10-CM

## 2021-06-01 DIAGNOSIS — D508 Other iron deficiency anemias: Secondary | ICD-10-CM | POA: Diagnosis not present

## 2021-06-01 DIAGNOSIS — K5909 Other constipation: Secondary | ICD-10-CM

## 2021-06-01 DIAGNOSIS — K219 Gastro-esophageal reflux disease without esophagitis: Secondary | ICD-10-CM | POA: Diagnosis not present

## 2021-06-01 DIAGNOSIS — K449 Diaphragmatic hernia without obstruction or gangrene: Secondary | ICD-10-CM
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
## 2021-06-01 NOTE — Patient Instructions (Signed)
If you are age 77 or older, your body mass index should be between 23-30. Your Body mass index is 22.47 kg/m. If this is out of the aforementioned range listed, please consider follow up with your Primary Care Provider.  If you are age 40 or younger, your body mass index should be between 19-25. Your Body mass index is 22.47 kg/m. If this is out of the aformentioned range listed, please consider follow up with your Primary Care Provider.   __________________________________________________________  The [facility] providers would like to encourage you to use [facility] to communicate with providers for non-urgent requests or questions.  Due to long hold times on the telephone, sending your provider a message by [facility] may be a faster and more efficient way to get a response.  Please allow 48 business hours for a response.  Please remember that this is for non-urgent requests.   We will be in touch about getting you scheduled for a EGD and colonoscopy soon.  It was a pleasure to see you today!  Thank you for trusting me with your gastrointestinal care!

## 2021-06-02 ENCOUNTER — Other Ambulatory Visit: Payer: Self-pay

## 2021-06-02 ENCOUNTER — Encounter: Payer: Self-pay | Admitting: Physical Therapy

## 2021-06-02 ENCOUNTER — Ambulatory Visit (INDEPENDENT_AMBULATORY_CARE_PROVIDER_SITE_OTHER): Payer: Medicare Other | Admitting: Physical Therapy

## 2021-06-02 DIAGNOSIS — M6281 Muscle weakness (generalized): Secondary | ICD-10-CM | POA: Diagnosis not present

## 2021-06-02 DIAGNOSIS — R262 Difficulty in walking, not elsewhere classified: Secondary | ICD-10-CM | POA: Diagnosis not present

## 2021-06-05 ENCOUNTER — Encounter: Payer: Self-pay | Admitting: Physical Therapy
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
## 2021-06-06 ENCOUNTER — Encounter: Payer: Self-pay | Admitting: Physical Therapy

## 2021-06-06 ENCOUNTER — Ambulatory Visit (INDEPENDENT_AMBULATORY_CARE_PROVIDER_SITE_OTHER): Payer: Medicare Other | Admitting: Physical Therapy

## 2021-06-06 ENCOUNTER — Ambulatory Visit: Payer: Medicare Other | Attending: Internal Medicine

## 2021-06-06 ENCOUNTER — Other Ambulatory Visit (HOSPITAL_BASED_OUTPATIENT_CLINIC_OR_DEPARTMENT_OTHER): Payer: Self-pay

## 2021-06-06 ENCOUNTER — Other Ambulatory Visit: Payer: Self-pay

## 2021-06-06 DIAGNOSIS — M6281 Muscle weakness (generalized): Secondary | ICD-10-CM

## 2021-06-06 DIAGNOSIS — Z23 Encounter for immunization: Secondary | ICD-10-CM

## 2021-06-06 DIAGNOSIS — R262 Difficulty in walking, not elsewhere classified: Secondary | ICD-10-CM

## 2021-06-06 MED ORDER — PFIZER COVID-19 VAC BIVALENT 30 MCG/0.3ML IM SUSP
INTRAMUSCULAR | 0 refills | Status: DC
  Filled 2021-06-06: qty 0.3, 1d supply, fill #0

## 2021-06-06 MED ORDER — FLUAD QUADRIVALENT 0.5 ML IM PRSY
PREFILLED_SYRINGE | INTRAMUSCULAR | 0 refills | Status: DC
  Filled 2021-06-06: qty 0.5, 1d supply, fill #0

## 2021-06-06 NOTE — Progress Notes (Signed)
   Covid-19 Vaccination Clinic  Name:  Richard Navarro    MRN: [id number] DOB: 1945/09/26  06/06/2021  [name] was observed post Covid-19 immunization for 15 minutes without incident. He was provided with Vaccine Information Sheet and instruction to access the V-Safe system.   [name] was instructed to call 911 with any severe reactions post vaccine: Difficulty breathing  Swelling of face and throat  A fast heartbeat  A bad rash all over body  Dizziness and weakness

## 2021-06-07 ENCOUNTER — Other Ambulatory Visit: Payer: Self-pay

## 2021-06-07 ENCOUNTER — Telehealth: Payer: Self-pay

## 2021-06-07 DIAGNOSIS — R1310 Dysphagia, unspecified: Secondary | ICD-10-CM

## 2021-06-07 DIAGNOSIS — Z1212 Encounter for screening for malignant neoplasm of rectum: Secondary | ICD-10-CM

## 2021-06-07 DIAGNOSIS — Z1211 Encounter for screening for malignant neoplasm of colon: Secondary | ICD-10-CM
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
## 2021-06-07 NOTE — Telephone Encounter (Addendum)
Patient has been scheduled for an (Dr. Carlean Purl) EGD with fluoroscopy dilation and colonoscopy at [facility] on Tuesday, 07/26/21 at 8:45 am. Pt will need to arrive at [facility] with a care partner by 7:15 am. Will mail instructions to patient.  CASE ID: [id number]  Patient has been scheduled for a follow up with Dr. Lamonte Sakai at [facility] on Tuesday, 06/28/21 at 11:30 am.   Lm on vm for patient to return call.

## 2021-06-07 NOTE — Telephone Encounter (Signed)
-----   Message from [place], MD sent at 06/07/2021  5:50 AM EDT ----- Regarding: procedure scheduling Richard Navarro,    I saw this patient in clinic recently and wrote an extensive note.  He needs an EGD (with fluoroscopy dilation) and colonoscopy at [facility] for dysphagia and CRC screening.     Dr. Carlean Purl agreed to allow patients of mine to go on his yellow block schedule while he is purple doc December 6th (I am on hospital service that week).    Please contact this patient or his wife and put him in that date.  Ducolax and golytely prep instructions (split dose) can be mailed to him (there are no pre-visits available) Patient was made aware at the office visit that his procedures might be with another doc and was OK with that.  Also, I wanted this patient seen by his pulmonologist Lamonte Sakai) and sent him a message, but no reply and don't see then patient scheduled with [facility]. Please contact that office and help arrange that.  When that's done, please put it in a telephone note and route to me.  There will likely be another patient of mine to go in that block as well, will let you know ASAP.  Thanks  - HD

## 2021-06-08 MED ORDER — GOLYTELY 236 G PO SOLR: 4000.0000 mL | Freq: Once | ORAL | 0 refills | Status: AC

## 2021-06-08 NOTE — Telephone Encounter (Signed)
[name] returned call, we have reviewed patient's appt information for EGD with dil and colonoscopy at [facility]. She is also aware of appt with Dr. Lamonte Sakai. [name] has confirmed address on file. [name] verbalized understanding of all information.  Ambulatory referral to GI in epic.  RX for Golytely sent to pharmacy on file.

## 2021-06-08 NOTE — Telephone Encounter (Signed)
Lm on vm for patient to return call 

## 2021-06-08 NOTE — Telephone Encounter (Signed)
[place],  Excellent - thanks for arranging all that.  Glendell Docker,    I recently met this patient in the office and there is a detailed note about his condition.  Wanted you to know since he is now on your [facility] schedule in December.  Thanks again,  - HD

## 2021-06-09 ENCOUNTER — Other Ambulatory Visit: Payer: Self-pay

## 2021-06-09 ENCOUNTER — Ambulatory Visit (INDEPENDENT_AMBULATORY_CARE_PROVIDER_SITE_OTHER): Payer: Medicare Other | Admitting: Physical Therapy

## 2021-06-09 DIAGNOSIS — R262 Difficulty in walking, not elsewhere classified: Secondary | ICD-10-CM

## 2021-06-09 DIAGNOSIS — M6281 Muscle weakness (generalized): Secondary | ICD-10-CM

## 2021-06-12 ENCOUNTER — Encounter: Payer: Self-pay | Admitting: Physical Therapy
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
## 2021-06-16 ENCOUNTER — Ambulatory Visit (INDEPENDENT_AMBULATORY_CARE_PROVIDER_SITE_OTHER): Payer: Medicare Other | Admitting: Physical Therapy

## 2021-06-16 ENCOUNTER — Other Ambulatory Visit: Payer: Self-pay

## 2021-06-16 DIAGNOSIS — R262 Difficulty in walking, not elsewhere classified: Secondary | ICD-10-CM

## 2021-06-16 DIAGNOSIS — M6281 Muscle weakness (generalized): Secondary | ICD-10-CM | POA: Diagnosis not present
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
## 2021-06-21 ENCOUNTER — Ambulatory Visit (INDEPENDENT_AMBULATORY_CARE_PROVIDER_SITE_OTHER): Payer: Medicare Other | Admitting: Physical Therapy

## 2021-06-21 ENCOUNTER — Other Ambulatory Visit: Payer: Self-pay

## 2021-06-21 ENCOUNTER — Encounter: Payer: Self-pay | Admitting: Physical Therapy

## 2021-06-21 DIAGNOSIS — R262 Difficulty in walking, not elsewhere classified: Secondary | ICD-10-CM | POA: Diagnosis not present

## 2021-06-21 DIAGNOSIS — M6281 Muscle weakness (generalized): Secondary | ICD-10-CM
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
## 2021-06-23 ENCOUNTER — Ambulatory Visit (INDEPENDENT_AMBULATORY_CARE_PROVIDER_SITE_OTHER): Payer: Medicare Other | Admitting: Physical Therapy

## 2021-06-23 ENCOUNTER — Other Ambulatory Visit: Payer: Self-pay

## 2021-06-23 DIAGNOSIS — R262 Difficulty in walking, not elsewhere classified: Secondary | ICD-10-CM

## 2021-06-23 DIAGNOSIS — M6281 Muscle weakness (generalized): Secondary | ICD-10-CM

## 2021-06-26 ENCOUNTER — Encounter: Payer: Self-pay | Admitting: Physical Therapy

## 2021-06-26 NOTE — Patient Instructions (Signed)
Access Code: [email or web address] URL: [email or web address] Date: 06/26/2021 Prepared by: Lyndee Hensen  Exercises Shoulder External Rotation and Scapular Retraction with Resistance - 1 x daily - 7 x weekly - 2 sets - 10 reps Standing Shoulder Row with Anchored Resistance - 1 x daily - 7 x weekly - 2 sets - 10 reps Seated Scapular Retraction - 1 x daily - 7 x weekly - 2 sets - 10 reps Doorway Pec Stretch at 60 Elevation - 2 x daily - 10 reps - 30 hold Supine Shoulder Flexion Extension AAROM with Dowel - 1 x daily - 1 sets - 10 reps Sit to Stand - 1 x daily - 7 x weekly - 2 sets - 10 reps Standing March with Counter Support - 1 x daily - 2 sets - 10 reps Heel Raises with Counter Support - 1 x daily - 2 sets - 10 reps Standing Tandem Balance with Counter Support - 1 x daily - 3 reps - 30 hold
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
## 2021-06-27 ENCOUNTER — Encounter: Payer: Medicare Other | Admitting: Physical Therapy

## 2021-06-27 DIAGNOSIS — Z20828 Contact with and (suspected) exposure to other viral communicable diseases: Secondary | ICD-10-CM | POA: Diagnosis not present

## 2021-06-28 ENCOUNTER — Other Ambulatory Visit: Payer: Self-pay

## 2021-06-28 ENCOUNTER — Encounter: Payer: Self-pay | Admitting: Emergency Medicine

## 2021-06-28 ENCOUNTER — Ambulatory Visit (INDEPENDENT_AMBULATORY_CARE_PROVIDER_SITE_OTHER): Payer: Medicare Other | Admitting: Emergency Medicine

## 2021-06-28 DIAGNOSIS — C3431 Malignant neoplasm of lower lobe, right bronchus or lung: Secondary | ICD-10-CM

## 2021-06-28 DIAGNOSIS — J9611 Chronic respiratory failure with hypoxia: Secondary | ICD-10-CM

## 2021-06-28 DIAGNOSIS — J449 Chronic obstructive pulmonary disease, unspecified: Secondary | ICD-10-CM | POA: Diagnosis not present

## 2021-06-28 MED ORDER — STIOLTO RESPIMAT 2.5-2.5 MCG/ACT IN AERS: 2.0000 | INHALATION_SPRAY | Freq: Every day | RESPIRATORY_TRACT | 0 refills | Status: DC

## 2021-06-28 NOTE — Progress Notes (Signed)
   Subjective:    Patient ID: Richard Navarro, male    DOB: [birth year], 75 y.o.   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 He was hospitalized for aspiration PNA in August 2022, ? Some dysphagia due to his XRT. Went home with O2. Currently on observation, stopped maintenance therapy until next f/u w Dr [name] and CT in 07/2021.  He has been working with physical therapy and has had some identified desaturations with heavy exertion, started on oxygen to use at these times, 2 L/min.  He is not desaturated on room air here today.  He is currently on Spiriva. Unsure whether he is getting as much out of it. Uses albuterol rarely.   Planning for colonoscopy/endoscopy coming up soon. Here to talk about risk stratification.   CT chest abdomen pelvis 04/29/2021 showed slightly decreased size of his right lower lobe mass, stable hepatic and osseous disease.  Pulmonary function testing 12/05/2016 with mild obstruction without a bronchodilator response, normal lung volumes, decreased diffusion capacity.     Review of Systems As per HPI     Objective:   Physical Exam   Vitals:   06/28/21 1145  BP: 134/80  Pulse: 82  Temp: 98.3 F (36.8 C)  TempSrc: Oral  SpO2: 99%  Weight: 183 lb 4.8 oz (83.1 kg)  Height: 6\' 3"  (1.905 m)   Gen: Pleasant, thin, in no distress,  normal affect  ENT: Small ulceration left posterior pharynx. Mouth clear,  oropharynx clear, no postnasal drip  Neck: No JVD, no stridor  Lungs: No use of accessory muscles, no crackles or wheezes.   Cardiovascular: RRR, heart sounds normal, no murmur or gallops, no peripheral edema  Musculoskeletal: No deformities, no cyanosis or clubbing  Neuro: alert, non focal  Skin: Warm, no lesions or rash     Assessment & Plan:  COPD (chronic obstructive pulmonary  disease) ([place]) With persistent exertional symptoms, newly identified hypoxemic respiratory failure while working with physical therapy.  Will try changing his Spiriva to Stiolto to see if he gets clinical benefit.  If so we will continue it.  We will temporarily stop Spiriva. Try starting Stiolto 2 puffs once daily.  Keep track of whether you get more benefit from this medication.  If so we will send a prescription to your pharmacy. Use albuterol 2 puffs up to every 4 hours if needed for shortness of breath, chest tightness, wheezing. Follow with Dr Lamonte Sakai in 6 months or sooner if you have any problems  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 [facility]) Get your follow-up scan with Dr. [name] as planned  Chronic hypoxemic respiratory failure ([place]) Newly started on oxygen 2 L/min with exertion.  Discussed compliance with him, saturation goal of 90%.  You might benefit from wearing your oxygen more reliably with exertion.  We will perform an oximetry today to see if you need to do so, see if you qualify for a portable oxygen concentrator.  You are at moderate risk for sedation in order to undergo your colonoscopy and endoscopy.  There is no contraindication to getting your procedures done as long as the benefits outweigh this risk.  [name], MD, PhD 08/04/2021, 8:17 AM [facility] [phone] or if no answer [phone]

## 2021-06-28 NOTE — Patient Instructions (Signed)
We will temporarily stop Spiriva. Try starting Stiolto 2 puffs once daily.  Keep track of whether you get more benefit from this medication.  If so we will send a prescription to your pharmacy. Use albuterol 2 puffs up to every 4 hours if needed for shortness of breath, chest tightness, wheezing. You might benefit from wearing your oxygen more reliably with exertion.  We will perform an oximetry today to see if you need to do so, see if you qualify for a portable oxygen concentrator. You are at moderate risk for sedation in order to undergo your colonoscopy and endoscopy.  There is no contraindication to getting your procedures done as long as the benefits outweigh this risk. Get your follow-up scan with Dr. [name] as planned Follow with Dr Lamonte Sakai in 6 months or sooner if you have any problems

## 2021-06-29 ENCOUNTER — Encounter: Payer: Medicare Other | Admitting: Physical Therapy

## 2021-07-11 ENCOUNTER — Encounter (HOSPITAL_COMMUNITY): Payer: Self-pay | Admitting: Internal Medicine

## 2021-07-13 ENCOUNTER — Other Ambulatory Visit: Payer: Self-pay | Admitting: Family Medicine

## 2021-07-18 ENCOUNTER — Telehealth (INDEPENDENT_AMBULATORY_CARE_PROVIDER_SITE_OTHER): Payer: Medicare Other | Admitting: Family Medicine

## 2021-07-18 ENCOUNTER — Other Ambulatory Visit: Payer: Self-pay

## 2021-07-18 ENCOUNTER — Telehealth: Payer: Self-pay

## 2021-07-18 ENCOUNTER — Encounter: Payer: Self-pay | Admitting: Family Medicine

## 2021-07-18 VITALS — Ht 75.0 in | Wt 184.0 lb

## 2021-07-18 DIAGNOSIS — J449 Chronic obstructive pulmonary disease, unspecified: Secondary | ICD-10-CM | POA: Diagnosis not present

## 2021-07-18 DIAGNOSIS — E1151 Type 2 diabetes mellitus with diabetic peripheral angiopathy without gangrene: Secondary | ICD-10-CM

## 2021-07-18 DIAGNOSIS — N1832 Chronic kidney disease, stage 3b: Secondary | ICD-10-CM

## 2021-07-18 MED ORDER — AMOXICILLIN-POT CLAVULANATE 875-125 MG PO TABS: 1.0000 | ORAL_TABLET | Freq: Two times a day (BID) | ORAL | 0 refills | Status: DC

## 2021-07-18 NOTE — Telephone Encounter (Signed)
Received a call from patient's wife, she called in stating Richard Navarro has been running a fever and a wet cough for the last few days. They wanted to know if something could be called in, offered a virtual visit but Richard Navarro declined saying he would like just a prescription sent in; advised we would need to have an appointment. Richard Navarro and Richard Navarro state they will make a decision to go to either the ED or call back to make appointment.

## 2021-07-18 NOTE — Progress Notes (Signed)
   Richard Navarro is a 75 y.o. male who presents today for a virtual office visit.  Assessment/Plan:  New/Acute Problems: Cough / Fever Discussed limitations of virtual visit and inability to perform physical exam and diagnostic testing.  Speaking in full sentences and has no signs of respiratory distress based on visual exam today.  Patient has recent history of aspiration pneumonia resulting in sepsis and hospital admission.  Also has a history of stage IV lung cancer.  Given that symptoms have been going on for a few weeks do not think COVID or flu testing would be of much utility at this point. This could potentially be COPD flare however must also consider recurrent aspiration pneumonia.  In the interest of best patient care we will start course of Augmentin -this should treat both potential COPD flare as well as aspiration pneumonia.  He had this a couple of months ago and did well with this.  We discussed reasons to return to care or seek emergent care.  Chronic Problems Addressed Today: Lung Cancer / T2DM/ CKD stage 3 / COPD High risk for decompensation due to multiple comorbidities and recent admission for sepsis.  We will empirically start Augmentin as above.  Discussed reasons to return to care and seek emergent care.    Subjective:  HPI:  Patient with cough and fever for a few weeks.  Seems to be getting worse over the last Tylenol seems to help.  Patient was admitted to the hospital a couple of months ago due to sepsis secondary to aspiration pneumonia. Cough has been productive of sputum. Mucinex helps. No known sick contacts.        Objective/Observations  Physical Exam: Gen: NAD, resting comfortably Pulm: Normal work of breathing Neuro: Grossly normal, moves all extremities Psych: Normal affect and thought content  Virtual Visit via Video   I connected with Richard Navarro on 07/18/21 at  1:00 PM EST by a video enabled telemedicine application and verified that I am  speaking with the correct person using two identifiers. The limitations of evaluation and management by telemedicine and the availability of in person appointments were discussed. The patient expressed understanding and agreed to proceed.   Patient location: Home Provider location: [place] participating in the virtual visit: Myself and Patient and his wife     Richard Navarro. Richard Pain, MD 07/18/2021 10:40 AM

## 2021-07-19 NOTE — Telephone Encounter (Signed)
Pt did a virtual with Dr. Jerline Pain yesterday.

## 2021-07-26 ENCOUNTER — Ambulatory Visit (HOSPITAL_COMMUNITY): Payer: Medicare Other | Admitting: Registered Nurse

## 2021-07-26 ENCOUNTER — Ambulatory Visit (HOSPITAL_COMMUNITY)
Admission: RE | Admit: 2021-07-26 | Discharge: 2021-07-26 | Disposition: A | Discharge status: Home / Self Care | Payer: Medicare Other | Attending: Internal Medicine | Admitting: Internal Medicine

## 2021-07-26 ENCOUNTER — Encounter (HOSPITAL_COMMUNITY): Payer: Self-pay | Admitting: Internal Medicine

## 2021-07-26 ENCOUNTER — Other Ambulatory Visit: Payer: Self-pay

## 2021-07-26 ENCOUNTER — Encounter (HOSPITAL_COMMUNITY)
Admission: RE | Disposition: A | Discharge status: Home / Self Care | Payer: Self-pay | Source: Home / Self Care | Attending: Internal Medicine

## 2021-07-26 DIAGNOSIS — K635 Polyp of colon: Secondary | ICD-10-CM | POA: Diagnosis not present

## 2021-07-26 DIAGNOSIS — N189 Chronic kidney disease, unspecified: Secondary | ICD-10-CM | POA: Insufficient documentation

## 2021-07-26 DIAGNOSIS — R1310 Dysphagia, unspecified: Secondary | ICD-10-CM | POA: Diagnosis not present

## 2021-07-26 DIAGNOSIS — Z1212 Encounter for screening for malignant neoplasm of rectum: Secondary | ICD-10-CM | POA: Diagnosis not present

## 2021-07-26 DIAGNOSIS — C787 Secondary malignant neoplasm of liver and intrahepatic bile duct: Secondary | ICD-10-CM | POA: Insufficient documentation

## 2021-07-26 DIAGNOSIS — K449 Diaphragmatic hernia without obstruction or gangrene: Secondary | ICD-10-CM | POA: Diagnosis not present

## 2021-07-26 DIAGNOSIS — J439 Emphysema, unspecified: Secondary | ICD-10-CM | POA: Insufficient documentation

## 2021-07-26 DIAGNOSIS — Z85110 Personal history of malignant carcinoid tumor of bronchus and lung: Secondary | ICD-10-CM | POA: Diagnosis not present

## 2021-07-26 DIAGNOSIS — D123 Benign neoplasm of transverse colon: Secondary | ICD-10-CM | POA: Insufficient documentation

## 2021-07-26 DIAGNOSIS — Z800 Family history of malignant neoplasm of digestive organs: Secondary | ICD-10-CM | POA: Diagnosis not present

## 2021-07-26 DIAGNOSIS — E1122 Type 2 diabetes mellitus with diabetic chronic kidney disease: Secondary | ICD-10-CM | POA: Diagnosis not present

## 2021-07-26 DIAGNOSIS — K5900 Constipation, unspecified: Secondary | ICD-10-CM | POA: Diagnosis not present

## 2021-07-26 DIAGNOSIS — F4310 Post-traumatic stress disorder, unspecified: Secondary | ICD-10-CM | POA: Diagnosis not present

## 2021-07-26 DIAGNOSIS — I129 Hypertensive chronic kidney disease with stage 1 through stage 4 chronic kidney disease, or unspecified chronic kidney disease: Secondary | ICD-10-CM | POA: Insufficient documentation

## 2021-07-26 DIAGNOSIS — K222 Esophageal obstruction: Secondary | ICD-10-CM | POA: Insufficient documentation

## 2021-07-26 DIAGNOSIS — Z1211 Encounter for screening for malignant neoplasm of colon: Secondary | ICD-10-CM | POA: Diagnosis not present

## 2021-07-26 DIAGNOSIS — Z87891 Personal history of nicotine dependence: Secondary | ICD-10-CM | POA: Diagnosis not present

## 2021-07-26 DIAGNOSIS — E785 Hyperlipidemia, unspecified: Secondary | ICD-10-CM | POA: Diagnosis not present

## 2021-07-26 HISTORY — PX: ESOPHAGOGASTRODUODENOSCOPY (EGD) WITH PROPOFOL: SHX5813

## 2021-07-26 HISTORY — PX: POLYPECTOMY: SHX5525

## 2021-07-26 HISTORY — PX: BALLOON DILATION: SHX5330

## 2021-07-26 HISTORY — PX: COLONOSCOPY WITH PROPOFOL: SHX5780

## 2021-07-26 LAB — GLUCOSE, CAPILLARY: Glucose-Capillary: 123 mg/dL — ABNORMAL HIGH (ref 70–99)

## 2021-07-26 SURGERY — ESOPHAGOGASTRODUODENOSCOPY (EGD) WITH PROPOFOL
Anesthesia: Monitor Anesthesia Care

## 2021-07-26 MED ORDER — LACTATED RINGERS IV SOLN
INTRAVENOUS | Status: AC | PRN
  Administered 2021-07-26: 1000 mL via INTRAVENOUS

## 2021-07-26 MED ORDER — PROPOFOL 500 MG/50ML IV EMUL
INTRAVENOUS | Status: DC | PRN
  Administered 2021-07-26: 130 ug/kg/min via INTRAVENOUS

## 2021-07-26 MED ORDER — PROPOFOL 1000 MG/100ML IV EMUL
INTRAVENOUS | Status: AC
  Filled 2021-07-26: qty 100

## 2021-07-26 MED ORDER — SODIUM CHLORIDE 0.9 % IV SOLN: INTRAVENOUS | Status: DC

## 2021-07-26 MED ORDER — LACTATED RINGERS IV SOLN: INTRAVENOUS | Status: DC

## 2021-07-26 SURGICAL SUPPLY — 24 items

## 2021-07-26 NOTE — Discharge Instructions (Addendum)
I dilated the esophagus to see if that helps you swallow better. 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   The colonoscopy revealed one tiny polyp - looks benign - removed.  I will have it analyzed.  I see that your cancer treatments are on hold - follow recommendations from Dr. [name] about taking these medications.  I appreciate the opportunity to care for you. Gatha Mayer, MD, FACG  YOU HAD AN ENDOSCOPIC PROCEDURE TODAY: Refer to the procedure report and other information in the discharge instructions given to you for any specific questions about what was found during the examination. If this information does not answer your questions, please call Dr. Celesta Aver office at [phone] to clarify.   YOU SHOULD EXPECT: Some feelings of bloating in the abdomen. Passage of more gas than usual. Walking can help get rid of the air that was put into your GI tract during the procedure and reduce the bloating. If you had a lower endoscopy (such as a colonoscopy or flexible sigmoidoscopy) you may notice spotting of blood in your stool or on the toilet paper. Some abdominal soreness may be present for a day or two, also.  DIET: Start with liquids only until 11 AM then soft foods today. Normal consistency foods tomorrow.  ACTIVITY: Your care partner should take you home directly after the procedure. You should plan to take it easy, moving slowly for the rest of the day. You can resume normal activity the day after the procedure however YOU SHOULD NOT DRIVE, use power tools, machinery or perform tasks that involve climbing or major physical exertion for 24 hours (because of the sedation medicines used during the test).   SYMPTOMS TO REPORT IMMEDIATELY: A gastroenterologist can be reached at any hour. Please call [phone]  for any of the following symptoms:  Following lower endoscopy (colonoscopy, flexible sigmoidoscopy) Excessive amounts of  blood in the stool  Significant tenderness, worsening of abdominal pains  Swelling of the abdomen that is new, acute  Fever of 100 or higher  Following upper endoscopy (EGD, EUS, ERCP, esophageal dilation) Vomiting of blood or coffee ground material  New, significant abdominal pain  New, significant chest pain or pain under the shoulder blades  Painful or persistently difficult swallowing  New shortness of breath  Black, tarry-looking or red, bloody stools  FOLLOW UP:  If any biopsies were taken you will be contacted by phone or by letter within the next 1-3 weeks. Call [phone]  if you have not heard about the biopsies in 3 weeks.  Please also call with any specific questions about appointments or follow up tests.

## 2021-07-26 NOTE — Op Note (Signed)
[facility] Patient Name: Richard Navarro Procedure Date: 07/26/2021 MRN: [id number] Attending MD: Gatha Mayer , MD Date of Birth: 25-Jan-1946 CSN: [id number] Age: 75 Admit Type: Inpatient Procedure:                Colonoscopy Indications:              Screening for colorectal malignant neoplasm Providers:                Gatha Mayer, MD, Jaci Carrel, RN, Benetta Spar, Technician Referring MD:              Medicines:                Propofol per Anesthesia, Monitored Anesthesia Care Complications:            No immediate complications. Estimated Blood Loss:     Estimated blood loss was minimal. Procedure:                Pre-Anesthesia Assessment:                           - Prior to the procedure, a History and Physical                            was performed, and patient medications and                            allergies were reviewed. The patient's tolerance of                            previous anesthesia was also reviewed. The risks                            and benefits of the procedure and the sedation                            options and risks were discussed with the patient.                            All questions were answered, and informed consent                            was obtained. Prior Anticoagulants: The patient has                            taken no previous anticoagulant or antiplatelet                            agents. ASA Grade Assessment: III - A patient with                            severe systemic disease. After reviewing the risks  and benefits, the patient was deemed in                            satisfactory condition to undergo the procedure.                           After obtaining informed consent, the colonoscope                            was passed under direct vision. Throughout the                            procedure, the patient's blood pressure, pulse,  and                            oxygen saturations were monitored continuously. The                            CF-HQ190L ([id number] Olympus colonoscope was                            introduced through the anus and advanced to the the                            cecum, identified by appendiceal orifice and                            ileocecal valve. The colonoscopy was performed                            without difficulty. The patient tolerated the                            procedure well. The quality of the bowel                            preparation was good. The ileocecal valve,                            appendiceal orifice, and rectum were photographed. Scope In: 9:13:38 AM Scope Out: 9:33:19 AM Scope Withdrawal Time: 0 hours 16 minutes 29 seconds  Total Procedure Duration: 0 hours 19 minutes 41 seconds  Findings:      The perianal and digital rectal examinations were normal.      A 1 mm polyp was found in the transverse colon. The polyp was sessile.       The polyp was removed with a cold biopsy forceps. Resection and       retrieval were complete. Verification of patient identification for the       specimen was done. Estimated blood loss was minimal.      The exam was otherwise without abnormality on direct and retroflexion       views. Impression:               - One 1 mm polyp in the transverse colon, removed  with a cold biopsy forceps. Resected and retrieved.                           - The examination was otherwise normal on direct                            and retroflexion views. Moderate Sedation:      Not Applicable - Patient had care per Anesthesia. Recommendation:           - Patient has a contact number available for                            emergencies. The signs and symptoms of potential                            delayed complications were discussed with the                            patient. Return to normal activities tomorrow.                             Written discharge instructions were provided to the                            patient.                           - No repeat colonoscopy due to age.                           - Clear liquids x 1 hour then soft foods rest of                            day. Start prior diet tomorrow.                           - Await pathology results. Procedure Code(s):        --- Professional ---                           [phone], Colonoscopy, flexible; with biopsy, single                            or multiple Diagnosis Code(s):        --- Professional ---                           Z12.11, Encounter for screening for malignant                            neoplasm of colon                           K63.5, Polyp of colon CPT copyright 2019 [employer]. All rights reserved. The codes documented in this report are preliminary and upon coder review may  be revised to meet current compliance requirements. Gatha Mayer, MD 07/26/2021 9:52:37 AM This report has been signed electronically. Number of Addenda: 0

## 2021-07-26 NOTE — Op Note (Signed)
[facility] Patient Name: Richard Navarro Procedure Date: 07/26/2021 MRN: [id number] Attending MD: Gatha Mayer , MD Date of Birth: 06-Nov-1945 CSN: [id number] Age: 75 Admit Type: Inpatient Procedure:                Upper GI endoscopy Indications:              Dysphagia Providers:                Gatha Mayer, MD, Jaci Carrel, RN, Benetta Spar, Technician Referring MD:              Medicines:                Propofol per Anesthesia, Monitored Anesthesia Care Complications:            No immediate complications. Estimated Blood Loss:     Estimated blood loss was minimal. Procedure:                Pre-Anesthesia Assessment:                           - Prior to the procedure, a History and Physical                            was performed, and patient medications and                            allergies were reviewed. The patient's tolerance of                            previous anesthesia was also reviewed. The risks                            and benefits of the procedure and the sedation                            options and risks were discussed with the patient.                            All questions were answered, and informed consent                            was obtained. Prior Anticoagulants: The patient has                            taken no previous anticoagulant or antiplatelet                            agents. ASA Grade Assessment: III - A patient with                            severe systemic disease. After reviewing the risks  and benefits, the patient was deemed in                            satisfactory condition to undergo the procedure.                           After obtaining informed consent, the endoscope was                            passed under direct vision. Throughout the                            procedure, the patient's blood pressure, pulse, and                             oxygen saturations were monitored continuously. The                            [id number]-[id number] ([id number] Olympus endoscope was introduced                            through the mouth, and advanced to the second part                            of duodenum. The upper GI endoscopy was                            accomplished without difficulty. The patient                            tolerated the procedure well. Scope In: Scope Out: Findings:      One benign-appearing, intrinsic stenosis was found at the       gastroesophageal junction. The stenosis was traversed. A TTS dilator was       passed through the scope. Dilation with an 18-19-20 mm balloon dilator       was performed to 20 mm. The dilation site was examined and showed       minimal mucosal disruption seen. Estimated blood loss was minimal.      A 7 cm hiatal hernia was present.      The exam was otherwise without abnormality.      The cardia and gastric fundus were otherwise normal on retroflexion.      The gastroesophageal flap valve was visualized endoscopically and       classified as Hill Grade IV (no fold, wide open lumen, hiatal hernia       present). Impression:               - Benign-appearing esophageal stenosis. Dilated.                            minimal disruption after 20 mm balloon                           - 7 cm hiatal hernia.                           -  The examination was otherwise normal.                           - Gastroesophageal flap valve classified as Hill                            Grade IV (no fold, wide open lumen, hiatal hernia                            present).                           - No specimens collected. Moderate Sedation:      Not Applicable - Patient had care per Anesthesia. Recommendation:           - Patient has a contact number available for                            emergencies. The signs and symptoms of potential                            delayed complications were discussed with the                             patient. Return to normal activities tomorrow.                            Written discharge instructions were provided to the                            patient.                           - Clear liquids x 1 hour then soft foods rest of                            day. Start prior diet tomorrow.                           - See the other procedure note for documentation of                            additional recommendations. Procedure Code(s):        --- Professional ---                           [id number], Esophagogastroduodenoscopy, flexible,                            transoral; with transendoscopic balloon dilation of                            esophagus (less than 30 mm diameter) Diagnosis Code(s):        --- Professional ---  K22.2, Esophageal obstruction                           K44.9, Diaphragmatic hernia without obstruction or                            gangrene                           R13.10, Dysphagia, unspecified CPT copyright 2019 [employer]. All rights reserved. The codes documented in this report are preliminary and upon coder review may  be revised to meet current compliance requirements. Gatha Mayer, MD 07/26/2021 9:50:16 AM This report has been signed electronically. Number of Addenda: 0

## 2021-07-26 NOTE — Anesthesia Preprocedure Evaluation (Signed)
Anesthesia Evaluation  Patient identified by MRN, date of birth, ID band Patient awake    Reviewed: Allergy & Precautions, H&P , NPO status , Patient's Chart, lab work & pertinent test results  Airway Mallampati: II  TM Distance: >3 FB Neck ROM: Full    Dental no notable dental hx.    Pulmonary COPD, former smoker,    Pulmonary exam normal breath sounds clear to auscultation       Cardiovascular hypertension, Pt. on medications Normal cardiovascular exam Rhythm:Regular Rate:Normal     Neuro/Psych negative neurological ROS  negative psych ROS   GI/Hepatic negative GI ROS, Neg liver ROS,   Endo/Other  diabetes  Renal/GU Renal InsufficiencyRenal disease  negative genitourinary   Musculoskeletal negative musculoskeletal ROS (+)   Abdominal   Peds negative pediatric ROS (+)  Hematology negative hematology ROS (+)   Anesthesia Other Findings   Reproductive/Obstetrics negative OB ROS                             Anesthesia Physical Anesthesia Plan  ASA: 3  Anesthesia Plan: MAC   Post-op Pain Management: Minimal or no pain anticipated   Induction: Intravenous  PONV Risk Score and Plan: 1 and Propofol infusion and Treatment may vary due to age or medical condition  Airway Management Planned: Simple Face Mask  Additional Equipment:   Intra-op Plan:   Post-operative Plan:   Informed Consent: I have reviewed the patients History and Physical, chart, labs and discussed the procedure including the risks, benefits and alternatives for the proposed anesthesia with the patient or authorized representative who has indicated his/her understanding and acceptance.     Dental advisory given  Plan Discussed with: CRNA and Surgeon  Anesthesia Plan Comments:         Anesthesia Quick Evaluation

## 2021-07-26 NOTE — Anesthesia Postprocedure Evaluation (Signed)
Anesthesia Post Note  Patient: Richard Navarro  Procedure(Navarro) Performed: ESOPHAGOGASTRODUODENOSCOPY (EGD) WITH PROPOFOL COLONOSCOPY WITH PROPOFOL SAVORY DILATION POLYPECTOMY     Patient location during evaluation: PACU Anesthesia Type: MAC Level of consciousness: awake and alert Pain management: pain level controlled Vital Signs Assessment: post-procedure vital signs reviewed and stable Respiratory status: spontaneous breathing, nonlabored ventilation, respiratory function stable and patient connected to nasal cannula oxygen Cardiovascular status: stable and blood pressure returned to baseline Postop Assessment: no apparent nausea or vomiting Anesthetic complications: no   No notable events documented.  Last Vitals:  Vitals:   07/26/21 1000 07/26/21 1003  BP: (!) 145/69   Pulse: 71 72  Resp: 18 (!) 25  Temp:    SpO2: 92% 100%    Last Pain:  Vitals:   07/26/21 0944  TempSrc: Axillary  PainSc:                  Richard Navarro
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
## 2021-07-26 NOTE — Transfer of Care (Signed)
Immediate Anesthesia Transfer of Care Note  Patient: Richard Navarro  Procedure(s) Performed: ESOPHAGOGASTRODUODENOSCOPY (EGD) WITH PROPOFOL COLONOSCOPY WITH PROPOFOL SAVORY DILATION POLYPECTOMY  Patient Location: PACU and Endoscopy Unit  Anesthesia Type:MAC  Level of Consciousness: awake, alert , oriented and patient cooperative  Airway & Oxygen Therapy: Patient Spontanous Breathing and Patient connected to face mask oxygen  Post-op Assessment: Report given to RN, Post -op Vital signs reviewed and stable and Patient moving all extremities  Post vital signs: Reviewed and stable  Last Vitals:  Vitals Value Taken Time  BP    Temp    Pulse 79 07/26/21 0940  Resp 27 07/26/21 0940  SpO2 100 % 07/26/21 0940  Vitals shown include unvalidated device data.  Last Pain:  Vitals:   07/26/21 0754  TempSrc: Oral  PainSc: 0-No pain         Complications: No notable events documented.

## 2021-07-27 ENCOUNTER — Encounter (HOSPITAL_COMMUNITY): Payer: Self-pay | Admitting: Internal Medicine

## 2021-07-27 LAB — SURGICAL PATHOLOGY

## 2021-07-29 ENCOUNTER — Other Ambulatory Visit: Payer: Self-pay

## 2021-07-29 ENCOUNTER — Ambulatory Visit (HOSPITAL_COMMUNITY)
Admission: RE | Admit: 2021-07-29 | Discharge: 2021-07-29 | Disposition: A | Discharge status: Home / Self Care | Payer: Medicare Other | Source: Ambulatory Visit | Attending: Internal Medicine | Admitting: Internal Medicine

## 2021-07-29 ENCOUNTER — Inpatient Hospital Stay: Payer: Medicare Other | Attending: Internal Medicine

## 2021-07-29 DIAGNOSIS — K449 Diaphragmatic hernia without obstruction or gangrene: Secondary | ICD-10-CM | POA: Diagnosis not present

## 2021-07-29 DIAGNOSIS — C3431 Malignant neoplasm of lower lobe, right bronchus or lung: Secondary | ICD-10-CM | POA: Diagnosis not present

## 2021-07-29 DIAGNOSIS — C7A8 Other malignant neuroendocrine tumors: Secondary | ICD-10-CM | POA: Diagnosis not present

## 2021-07-29 DIAGNOSIS — J439 Emphysema, unspecified: Secondary | ICD-10-CM | POA: Diagnosis not present

## 2021-07-29 DIAGNOSIS — Z7984 Long term (current) use of oral hypoglycemic drugs: Secondary | ICD-10-CM | POA: Insufficient documentation

## 2021-07-29 DIAGNOSIS — I700 Atherosclerosis of aorta: Secondary | ICD-10-CM | POA: Diagnosis not present

## 2021-07-29 DIAGNOSIS — R1310 Dysphagia, unspecified: Secondary | ICD-10-CM | POA: Insufficient documentation

## 2021-07-29 DIAGNOSIS — Z79899 Other long term (current) drug therapy: Secondary | ICD-10-CM | POA: Insufficient documentation

## 2021-07-29 DIAGNOSIS — C7A090 Malignant carcinoid tumor of the bronchus and lung: Secondary | ICD-10-CM | POA: Insufficient documentation

## 2021-07-29 DIAGNOSIS — N189 Chronic kidney disease, unspecified: Secondary | ICD-10-CM | POA: Insufficient documentation

## 2021-07-29 DIAGNOSIS — C7B02 Secondary carcinoid tumors of liver: Secondary | ICD-10-CM | POA: Insufficient documentation

## 2021-07-29 DIAGNOSIS — J90 Pleural effusion, not elsewhere classified: Secondary | ICD-10-CM | POA: Diagnosis not present

## 2021-07-29 DIAGNOSIS — C7B03 Secondary carcinoid tumors of bone: Secondary | ICD-10-CM | POA: Insufficient documentation

## 2021-07-29 DIAGNOSIS — N200 Calculus of kidney: Secondary | ICD-10-CM | POA: Diagnosis not present

## 2021-07-29 LAB — CMP (CANCER CENTER ONLY)
ALT: 53 U/L — ABNORMAL HIGH (ref 0–44)
AST: 29 U/L (ref 15–41)
Albumin: 3.2 g/dL — ABNORMAL LOW (ref 3.5–5.0)
Alkaline Phosphatase: 69 U/L (ref 38–126)
Anion gap: 14 (ref 5–15)
BUN: 16 mg/dL (ref 8–23)
CO2: 19 mmol/L — ABNORMAL LOW (ref 22–32)
Calcium: 9.3 mg/dL (ref 8.9–10.3)
Chloride: 105 mmol/L (ref 98–111)
Creatinine: 1.6 mg/dL — ABNORMAL HIGH (ref 0.61–1.24)
GFR, Estimated: 44 mL/min — ABNORMAL LOW (ref >60)
Glucose, Bld: 129 mg/dL — ABNORMAL HIGH (ref 70–99)
Potassium: 3.4 mmol/L — ABNORMAL LOW (ref 3.5–5.1)
Sodium: 138 mmol/L (ref 135–145)
Total Bilirubin: 0.5 mg/dL (ref 0.3–1.2)
Total Protein: 8.9 g/dL — ABNORMAL HIGH (ref 6.5–8.1)

## 2021-07-29 LAB — CBC WITH DIFFERENTIAL (CANCER CENTER ONLY)
Abs Immature Granulocytes: 0.02 10*3/uL (ref 0.00–0.07)
Basophils Absolute: 0 10*3/uL (ref 0.0–0.1)
Basophils Relative: 1 %
Eosinophils Absolute: 0.1 10*3/uL (ref 0.0–0.5)
Eosinophils Relative: 1 %
HCT: 36 % — ABNORMAL LOW (ref 39.0–52.0)
Hemoglobin: 12.3 g/dL — ABNORMAL LOW (ref 13.0–17.0)
Immature Granulocytes: 0 %
Lymphocytes Relative: 15 %
Lymphs Abs: 0.8 10*3/uL (ref 0.7–4.0)
MCH: 30.8 pg (ref 26.0–34.0)
MCHC: 34.2 g/dL (ref 30.0–36.0)
MCV: 90.2 fL (ref 80.0–100.0)
Monocytes Absolute: 0.4 10*3/uL (ref 0.1–1.0)
Monocytes Relative: 7 %
Neutro Abs: 3.7 10*3/uL (ref 1.7–7.7)
Neutrophils Relative %: 76 %
Platelet Count: 282 10*3/uL (ref 150–400)
RBC: 3.99 MIL/uL — ABNORMAL LOW (ref 4.22–5.81)
RDW: 12.9 % (ref 11.5–15.5)
WBC Count: 4.9 10*3/uL (ref 4.0–10.5)
nRBC: 0 % (ref 0.0–0.2)

## 2021-08-01 ENCOUNTER — Encounter: Payer: Self-pay | Admitting: Internal Medicine

## 2021-08-01 ENCOUNTER — Inpatient Hospital Stay (HOSPITAL_BASED_OUTPATIENT_CLINIC_OR_DEPARTMENT_OTHER): Payer: Medicare Other | Admitting: Internal Medicine

## 2021-08-01 ENCOUNTER — Other Ambulatory Visit: Payer: Self-pay

## 2021-08-01 VITALS — BP 159/84 | HR 115 | Temp 96.2°F | Resp 20 | Ht 75.0 in | Wt 169.7 lb

## 2021-08-01 DIAGNOSIS — Z79899 Other long term (current) drug therapy: Secondary | ICD-10-CM | POA: Diagnosis not present

## 2021-08-01 DIAGNOSIS — N189 Chronic kidney disease, unspecified: Secondary | ICD-10-CM | POA: Diagnosis not present

## 2021-08-01 DIAGNOSIS — C7B03 Secondary carcinoid tumors of bone: Secondary | ICD-10-CM | POA: Diagnosis not present

## 2021-08-01 DIAGNOSIS — R1310 Dysphagia, unspecified: Secondary | ICD-10-CM | POA: Diagnosis not present

## 2021-08-01 DIAGNOSIS — C7A8 Other malignant neuroendocrine tumors: Secondary | ICD-10-CM

## 2021-08-01 DIAGNOSIS — C7B02 Secondary carcinoid tumors of liver: Secondary | ICD-10-CM | POA: Diagnosis not present

## 2021-08-01 DIAGNOSIS — Z7984 Long term (current) use of oral hypoglycemic drugs: Secondary | ICD-10-CM | POA: Diagnosis not present

## 2021-08-01 DIAGNOSIS — C7A090 Malignant carcinoid tumor of the bronchus and lung: Secondary | ICD-10-CM | POA: Diagnosis not present
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
## 2021-08-04 ENCOUNTER — Encounter: Payer: Self-pay | Admitting: Emergency Medicine

## 2021-08-04 DIAGNOSIS — J9611 Chronic respiratory failure with hypoxia: Secondary | ICD-10-CM | POA: Insufficient documentation

## 2021-08-04 NOTE — Assessment & Plan Note (Signed)
With persistent exertional symptoms, newly identified hypoxemic respiratory failure while working with physical therapy.  Will try changing his Spiriva to Stiolto to see if he gets clinical benefit.  If so we will continue it.  We will temporarily stop Spiriva. Try starting Stiolto 2 puffs once daily.  Keep track of whether you get more benefit from this medication.  If so we will send a prescription to your pharmacy. Use albuterol 2 puffs up to every 4 hours if needed for shortness of breath, chest tightness, wheezing. Follow with Dr Lamonte Sakai in 6 months or sooner if you have any problems

## 2021-08-04 NOTE — Assessment & Plan Note (Signed)
Get your follow-up scan with Dr. [name] as planned

## 2021-08-04 NOTE — Assessment & Plan Note (Signed)
Newly started on oxygen 2 L/min with exertion.  Discussed compliance with him, saturation goal of 90%.  You might benefit from wearing your oxygen more reliably with exertion.  We will perform an oximetry today to see if you need to do so, see if you qualify for a portable oxygen concentrator.  You are at moderate risk for sedation in order to undergo your colonoscopy and endoscopy.  There is no contraindication to getting your procedures done as long as the benefits outweigh this risk.

## 2021-08-16 ENCOUNTER — Other Ambulatory Visit: Payer: Self-pay | Admitting: Internal Medicine

## 2021-08-16 ENCOUNTER — Telehealth: Payer: Self-pay

## 2021-08-16 DIAGNOSIS — C7A8 Other malignant neuroendocrine tumors: Secondary | ICD-10-CM

## 2021-08-16 MED ORDER — CAPECITABINE 500 MG PO TABS: ORAL_TABLET | ORAL | 2 refills | Status: DC

## 2021-08-16 MED ORDER — TEMOZOLOMIDE 100 MG PO CAPS: ORAL_CAPSULE | ORAL | 3 refills | Status: DC

## 2021-08-16 NOTE — Telephone Encounter (Signed)
Pts wife called stating pt has resumed his treatment with Xeloda and Temodar and needs a refill.

## 2021-08-23 ENCOUNTER — Other Ambulatory Visit: Payer: Self-pay | Admitting: Medical Oncology

## 2021-08-23 NOTE — Progress Notes (Signed)
Oncology meds refill sent .

## 2021-08-24 ENCOUNTER — Other Ambulatory Visit: Payer: Self-pay | Admitting: Medical Oncology

## 2021-08-24 ENCOUNTER — Other Ambulatory Visit (HOSPITAL_COMMUNITY): Payer: Self-pay

## 2021-08-24 DIAGNOSIS — R112 Nausea with vomiting, unspecified: Secondary | ICD-10-CM

## 2021-08-24 MED ORDER — ONDANSETRON HCL 8 MG PO TABS: ORAL_TABLET | Freq: Three times a day (TID) | ORAL | 0 refills | Status: DC | PRN

## 2021-08-24 NOTE — Telephone Encounter (Signed)
Zofran refilled 

## 2021-08-25 ENCOUNTER — Other Ambulatory Visit (HOSPITAL_COMMUNITY): Payer: Self-pay

## 2021-08-26 ENCOUNTER — Other Ambulatory Visit (HOSPITAL_COMMUNITY): Payer: Self-pay

## 2021-08-26 ENCOUNTER — Other Ambulatory Visit: Payer: Self-pay

## 2021-08-26 ENCOUNTER — Ambulatory Visit (INDEPENDENT_AMBULATORY_CARE_PROVIDER_SITE_OTHER): Payer: Medicare Other | Admitting: Podiatry

## 2021-08-26 ENCOUNTER — Telehealth: Payer: Self-pay

## 2021-08-26 DIAGNOSIS — M79674 Pain in right toe(s): Secondary | ICD-10-CM

## 2021-08-26 DIAGNOSIS — M79675 Pain in left toe(s): Secondary | ICD-10-CM

## 2021-08-26 DIAGNOSIS — E1151 Type 2 diabetes mellitus with diabetic peripheral angiopathy without gangrene: Secondary | ICD-10-CM

## 2021-08-26 DIAGNOSIS — B351 Tinea unguium: Secondary | ICD-10-CM

## 2021-08-26 NOTE — Telephone Encounter (Signed)
Pts wife LM to change pts pharmacy. She states he needs three rx's redirected to the new pharmacy:  Xeloda Zofran Timodar

## 2021-08-29 ENCOUNTER — Inpatient Hospital Stay: Payer: Medicare Other

## 2021-08-29 ENCOUNTER — Other Ambulatory Visit: Payer: Self-pay

## 2021-08-29 ENCOUNTER — Other Ambulatory Visit (HOSPITAL_COMMUNITY): Payer: Self-pay

## 2021-08-29 ENCOUNTER — Inpatient Hospital Stay: Payer: Medicare Other | Attending: Internal Medicine | Admitting: Internal Medicine

## 2021-08-29 VITALS — BP 126/69 | HR 79 | Temp 98.3°F | Resp 17 | Ht 75.0 in | Wt 172.8 lb

## 2021-08-29 DIAGNOSIS — C7A8 Other malignant neuroendocrine tumors: Secondary | ICD-10-CM

## 2021-08-29 DIAGNOSIS — C7A090 Malignant carcinoid tumor of the bronchus and lung: Secondary | ICD-10-CM | POA: Insufficient documentation

## 2021-08-29 DIAGNOSIS — C7B03 Secondary carcinoid tumors of bone: Secondary | ICD-10-CM | POA: Diagnosis not present

## 2021-08-29 DIAGNOSIS — C3491 Malignant neoplasm of unspecified part of right bronchus or lung: Secondary | ICD-10-CM | POA: Diagnosis not present

## 2021-08-29 DIAGNOSIS — C7B02 Secondary carcinoid tumors of liver: Secondary | ICD-10-CM | POA: Diagnosis not present

## 2021-08-29 DIAGNOSIS — R112 Nausea with vomiting, unspecified: Secondary | ICD-10-CM

## 2021-08-29 DIAGNOSIS — Z5111 Encounter for antineoplastic chemotherapy: Secondary | ICD-10-CM

## 2021-08-29 LAB — CBC WITH DIFFERENTIAL (CANCER CENTER ONLY)
Abs Immature Granulocytes: 0.01 10*3/uL (ref 0.00–0.07)
Basophils Absolute: 0 10*3/uL (ref 0.0–0.1)
Basophils Relative: 1 %
Eosinophils Absolute: 0 10*3/uL (ref 0.0–0.5)
Eosinophils Relative: 1 %
HCT: 35.7 % — ABNORMAL LOW (ref 39.0–52.0)
Hemoglobin: 11.6 g/dL — ABNORMAL LOW (ref 13.0–17.0)
Immature Granulocytes: 0 %
Lymphocytes Relative: 13 %
Lymphs Abs: 0.6 10*3/uL — ABNORMAL LOW (ref 0.7–4.0)
MCH: 30.5 pg (ref 26.0–34.0)
MCHC: 32.5 g/dL (ref 30.0–36.0)
MCV: 93.9 fL (ref 80.0–100.0)
Monocytes Absolute: 0.3 10*3/uL (ref 0.1–1.0)
Monocytes Relative: 7 %
Neutro Abs: 3.4 10*3/uL (ref 1.7–7.7)
Neutrophils Relative %: 78 %
Platelet Count: 244 10*3/uL (ref 150–400)
RBC: 3.8 MIL/uL — ABNORMAL LOW (ref 4.22–5.81)
RDW: 17 % — ABNORMAL HIGH (ref 11.5–15.5)
WBC Count: 4.3 10*3/uL (ref 4.0–10.5)
nRBC: 0 % (ref 0.0–0.2)

## 2021-08-29 LAB — CMP (CANCER CENTER ONLY)
ALT: 10 U/L (ref 0–44)
AST: 11 U/L — ABNORMAL LOW (ref 15–41)
Albumin: 3.8 g/dL (ref 3.5–5.0)
Alkaline Phosphatase: 69 U/L (ref 38–126)
Anion gap: 7 (ref 5–15)
BUN: 19 mg/dL (ref 8–23)
CO2: 25 mmol/L (ref 22–32)
Calcium: 9.1 mg/dL (ref 8.9–10.3)
Chloride: 104 mmol/L (ref 98–111)
Creatinine: 1.54 mg/dL — ABNORMAL HIGH (ref 0.61–1.24)
GFR, Estimated: 46 mL/min — ABNORMAL LOW (ref >60)
Glucose, Bld: 158 mg/dL — ABNORMAL HIGH (ref 70–99)
Potassium: 3.7 mmol/L (ref 3.5–5.1)
Sodium: 136 mmol/L (ref 135–145)
Total Bilirubin: 0.4 mg/dL (ref 0.3–1.2)
Total Protein: 8.1 g/dL (ref 6.5–8.1)

## 2021-08-29 MED ORDER — TEMOZOLOMIDE 100 MG PO CAPS
ORAL_CAPSULE | ORAL | 3 refills | Status: DC
  Filled 2021-08-29: qty 20, fill #0
  Filled 2021-09-21: qty 20, 28d supply, fill #0
  Filled 2021-10-26: qty 20, 28d supply, fill #1
  Filled 2021-11-17: qty 20, 28d supply, fill #2
  Filled 2021-12-15: qty 20, 28d supply, fill #3

## 2021-08-29 MED ORDER — CAPECITABINE 500 MG PO TABS
ORAL_TABLET | ORAL | 2 refills | Status: DC
  Filled 2021-08-29: qty 84, fill #0
  Filled 2021-09-21: qty 84, 28d supply, fill #0
  Filled 2021-10-26: qty 84, 28d supply, fill #1
  Filled 2021-11-17: qty 84, 28d supply, fill #2

## 2021-08-29 MED ORDER — ONDANSETRON HCL 8 MG PO TABS
ORAL_TABLET | Freq: Three times a day (TID) | ORAL | 0 refills | Status: DC | PRN
  Filled 2021-08-29: qty 30, fill #0
  Filled 2022-03-07: qty 30, 10d supply, fill #0
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
## 2021-08-31 ENCOUNTER — Other Ambulatory Visit: Payer: Self-pay | Admitting: Interventional Radiology

## 2021-08-31 DIAGNOSIS — C7A8 Other malignant neuroendocrine tumors: Secondary | ICD-10-CM

## 2021-08-31 DIAGNOSIS — C7B8 Other secondary neuroendocrine tumors: Secondary | ICD-10-CM

## 2021-09-01 ENCOUNTER — Other Ambulatory Visit (HOSPITAL_COMMUNITY): Payer: Self-pay

## 2021-09-01 ENCOUNTER — Encounter: Payer: Self-pay | Admitting: Podiatry

## 2021-09-01 NOTE — Progress Notes (Signed)
°  Subjective:  Patient ID: Richard Navarro, male    DOB: [birth year],  MRN: [id number]  Richard Navarro presents to clinic today for at risk foot care. Pt has h/o NIDDM with PAD and painful elongated mycotic toenails 1-5 bilaterally which are tender when wearing enclosed shoe gear. Pain is relieved with periodic professional debridement.  Patient does not monitor blood glucose daily, but last checked reading was 108 mg/dl.  PCP is [name], MD , and last visit was 1-2 months ago.  Allergies  Allergen Reactions   Lisinopril Swelling    Angioedema- on this and afinitor same time   Simvastatin Other (See Comments)    Joint ache    Review of Systems: Negative except as noted in the HPI. Objective:   Constitutional MILTON SAGONA is a pleasant 76 y.o. African American male, thin build in NAD. AAO x 3.   Vascular CFT <3 seconds b/l LE. Faintly palpable DP pulses b/l LE. Nonpalpable PT pulse(s) b/l LE. Pedal hair sparse. No pain with calf compression b/l. No edema noted b/l LE. No ischemia or gangrene noted b/l LE. No cyanosis or clubbing noted b/l LE.  Neurologic Normal speech. Oriented to person, place, and time. Protective sensation intact 5/5 intact bilaterally with 10g monofilament b/l.  Dermatologic Pedal skin thin and atrophic b/l LE. No open wounds b/l LE. No interdigital macerations noted b/l LE. Toenails 1-5 b/l elongated, discolored, dystrophic, thickened, crumbly with subungual debris and tenderness to dorsal palpation.  Orthopedic: Muscle strength 5/5 to all lower extremity muscle groups bilaterally. No pain, crepitus or joint limitation noted with ROM bilateral LE. Clawtoe deformity 2-5 bilaterally. Patient ambulates independent of any assistive aids.   Radiographs: None  Last A1c:  Hemoglobin A1C Latest Ref Rng & Units 04/17/2021 12/28/2020 09/21/2020  HGBA1C 4.8 - 5.6 % 7.0(H) 7.1(H) 6.3  Some recent data might be hidden   Assessment:   1. Pain due to  onychomycosis of toenails of both feet   2. Type II diabetes mellitus with peripheral circulatory disorder [facility])    Plan:  Patient was evaluated and treated and all questions answered. Consent given for treatment as described below: -Continue foot and shoe inspections daily. Monitor blood glucose per PCP/Endocrinologist's recommendations. -Mycotic toenails 1-5 bilaterally were debrided in length and girth with sterile nail nippers and dremel without incident. -Patient/POA to call should there be question/concern in the interim.  Return in about 3 months (around 11/24/2021).  Marzetta Board, DPM

## 2021-09-02 NOTE — Progress Notes (Signed)
Phone [phone] In person visit   Subjective:   Richard Navarro is a 76 y.o. year old very pleasant male patient who presents for/with See problem oriented charting Chief Complaint  Patient presents with   Follow-up   Diabetes   Hypertension   Hyperlipidemia    This visit occurred during the SARS-CoV-2 public health emergency.  Safety protocols were in place, including screening questions prior to the visit, additional usage of staff PPE, and extensive cleaning of exam room while observing appropriate contact time as indicated for disinfecting solutions.   Past Medical History-  Patient Active Problem List   Diagnosis Date Noted   Anemia 05/07/2018    Priority: High   Chronic kidney disease (CKD) stage G3b/A1, moderately decreased glomerular filtration rate (GFR) between 30-44 mL/min/1.73 square meter and albuminuria creatinine ratio less than 30 mg/g (HCC) 03/04/2018    Priority: High   Neuroendocrine carcinoma ([place]) 08/09/2017    Priority: High   Primary cancer of right lung ([place]) 12/22/2016    Priority: High   Diabetes mellitus with renal manifestation (HCC)     Priority: High   Myalgia due to statin 03/04/2018    Priority: Medium    COPD (chronic obstructive pulmonary disease) ([place]) 12/01/2016    Priority: Medium    PTSD (post-traumatic stress disorder) 12/07/2015    Priority: Medium    Erectile dysfunction 04/09/2014    Priority: Medium    Former smoker 03/26/2014    Priority: Medium    Hypertension associated with diabetes ([place])     Priority: Medium    Gout     Priority: Medium    Depression     Priority: Medium    Hyperlipidemia     Priority: Medium    Thrombocytopenia ([place])     Priority: Low   Pneumonia 08/09/2017    Priority: Low   Encounter for antineoplastic chemotherapy 01/10/2017    Priority: Low   Goals of care, counseling/discussion 01/10/2017    Priority: Low   Prostate cancer screening 04/17/2014    Priority: Low   Arthritis  03/26/2014    Priority: Low   History of adenomatous polyp of colon 03/26/2014    Priority: Low   GERD (gastroesophageal reflux disease)     Priority: Low   Chronic hypoxemic respiratory failure ([place]) 08/04/2021   Dysphagia    Encounter for colorectal cancer screening    Prolonged QT interval 04/17/2021   Sepsis ([place]) 04/16/2021   Alcohol abuse 10/26/2020   Benign neoplasm of colon 10/26/2020   Carpal tunnel syndrome 10/26/2020   Family history of malignant neoplasm of gastrointestinal tract 10/26/2020   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 ) 10/21/2019    Medications- reviewed and updated Current Outpatient Medications  Medication Sig Dispense Refill   albuterol (VENTOLIN HFA) 108 (90 Base) MCG/ACT inhaler Inhale 2 puffs into the lungs every 4 (four) hours as needed for wheezing or shortness of breath. 3 each 2   amLODipine (NORVASC) 10 MG tablet TAKE 1 TABLET DAILY 90 tablet 3   amoxicillin-clavulanate (AUGMENTIN) 875-125 MG tablet Take 1 tablet by mouth 2 (two) times daily. 14 tablet 0   Calcium Carbonate Antacid (TUMS ULTRA 1000 PO) Take 2,000 mg by mouth in the  morning and at bedtime.     capecitabine (XELODA) 500 MG tablet TAKE 3 TABLETS BY MOUTH TWICE DAILY FOR 14 DAYS EVERY 4 WEEKS. 84 tablet 2   Cholecalciferol (VITAMIN D3) 25 MCG (1000 UT) CAPS Take 1,000 Units by mouth daily.     Continuous Blood Gluc Sensor (FREESTYLE LIBRE 14 DAY SENSOR) MISC USE TO CHECK BLOOD SUGAR AS DIRECTED AND CHANGE EVERY 14 DAYS 2 each 5   docusate sodium (COLACE) 100 MG capsule Take 300 mg by mouth daily.     Ferrous Sulfate (IRON) 325 (65 Fe) MG TABS Take 325 mg by mouth every other day.     glipiZIDE  (GLUCOTROL) 5 MG tablet TAKE 1 TABLET DAILY BEFORE BREAKFAST 90 tablet 3   glucose blood test strip Use to test blood sugar twice a day 200 each 3   guaiFENesin (MUCINEX) 600 MG 12 hr tablet Take 600 mg by mouth 2 (two) times daily.      ketotifen (ZADITOR) 0.025 % ophthalmic solution Place 1 drop into both eyes daily as needed (allergies).     magic mouthwash w/lidocaine SOLN Take 5 mLs by mouth every 6 (six) hours as needed for mouth pain.     magnesium hydroxide (MILK OF MAGNESIA) 400 MG/5ML suspension Take 30 mLs by mouth daily as needed (constipation).     metoprolol tartrate (LOPRESSOR) 25 MG tablet Take 1 tablet (25 mg total) by mouth 2 (two) times daily. 180 tablet 3   ondansetron (ZOFRAN) 8 MG tablet TAKE 1 TABLET BY MOUTH EVERY 8 HOURS AS NEEDED FOR NAUSEA AND/OR VOMITING 30 tablet 0   OXYGEN Inhale 2 L into the lungs as needed (SOB).     pantoprazole (PROTONIX) 40 MG tablet Take 40 mg by mouth 2 (two) times daily.     polyethylene glycol (MIRALAX / GLYCOLAX) 17 g packet Take 17 g by mouth daily.     temozolomide (TEMODAR) 100 MG capsule TAKE 4 CAPS BY MOUTH DAILY DAYS 10,11,12,13 AND 14 EVERY 4 WEEKS. TAKE ON AN EMPTY STOMACH OR AT BEDTIME TO DECREASE NAUSEA & VOMITING 20 capsule 3   Tiotropium Bromide Monohydrate (SPIRIVA RESPIMAT) 2.5 MCG/ACT AERS Take 2 puffs by mouth daily. 12 g 3   Tiotropium Bromide-Olodaterol (STIOLTO RESPIMAT) 2.5-2.5 MCG/ACT AERS Inhale 2 puffs into the lungs daily. 4 g 0   vitamin B-12 (CYANOCOBALAMIN) 1000 MCG tablet Take 1,000 mcg by mouth daily.      vitamin C (ASCORBIC ACID) 500 MG tablet Take 500 mg by mouth daily.     allopurinol (ZYLOPRIM) 100 MG tablet Take 1 tablet (100 mg total) by mouth daily. 90 tablet 3   No current facility-administered medications for this visit.     Objective:  BP 122/64    Pulse 69    Temp (!) 97.2 F (36.2 C)    Ht 6\' 3"  (1.905 m)    Wt 173 lb 3.2 oz (78.6 kg)    SpO2 93%    BMI 21.65 kg/m  Gen: NAD, resting  comfortably CV: RRR no murmurs rubs or gallops Lungs: CTAB no crackles, wheeze, rhonchi Ext: no edema Skin: warm, dry    Assessment and Plan   # Lung cancer-  neuroendocrine with mets (slight enlargement of lymph nodes potentially on scan but otherwise stable)- continue chemotherapy/treatment with oncology. Immunosuppressed status noted- need to continue meds regardless- xeloda  and temodar  #COPD/chronic respiratory failure - has seen pulmonary as well and diagnosed with COPD and chronic respiratory failure. He is using  oxygen during day if oxygen is low- if running higher he hodls off- uses 41% threshold. COPD appears stable/better on stiolto over spiriva. Continue current meds. Respiratory failure stale.   #history of aspiration pneumonia/sepsis- prior issues frustrate him- we discussed how immunosuppression contributes. Aspiration pneumonia in august 2022  #Diabetes mellitus S: No Rx as of 06/23/2019 then later restarted glipizde 5 mg in AM- now on twice a day Prior to reduce GFR-patient had been on metformin 500 mg twice a day and Amaryl 2 mg daily.  CBGs- 110 on last check last week uses freestyle libre Lab Results  Component Value Date   HGBA1C 7.0 (H) 04/17/2021   HGBA1C 7.1 (H) 12/28/2020   HGBA1C 6.3 09/21/2020   A/P: a1c well controlled on glipizide. No low blood sugars on this- continue current meds.   #Hypertension associated with diabetes S: Compliant with amlodipine 10 mg daily and metoprolol 25 mg twice daily BP Readings from Last 3 Encounters:  09/09/21 122/64  08/29/21 126/69  08/01/21 (!) 159/84  A/P: Controlled. Continue current medications.   #Chronic kidney disease stage III S: Patient with significant worsened in kidney function dating back to November 2020 when patient was on Afinitor previously- later went back on this but did not worsened as much (did raise CBGs though and determined not effective so switched). Followed with [facility] - most  recently cr 1.54 and gfr 46 A/P: stable recently- continue to monitor   #Gout S: 0 flares in a long time on allopurinol 100 mg Lab Results  Component Value Date   LABURIC 5.3 09/21/2020  A/P:doing well- unlikely to change dose unless having flares   Recommended follow up: Return in about 4 months (around 01/07/2022) for follow up- or sooner if needed. Future Appointments  Date Time Provider [place]  09/15/2021 10:00 AM [facility] 1 [facility] [place]  09/21/2021  2:00 PM [facility] IR [facility]  11/10/2021  9:30 AM [facility]  11/29/2021  2:45 PM [place], Stephani Police, DPM [facility]   Lab/Order associations:   ICD-10-CM   1. Primary hypertension  I10     2. Type II diabetes mellitus with peripheral circulatory disorder (HCC)  E11.51 POCT HgB A1C    3. Idiopathic chronic gout without tophus, unspecified site  [id number].00X0     4. Chronic kidney disease (CKD) stage G3b/A1, moderately decreased glomerular filtration rate (GFR) between 30-44 mL/min/1.73 square meter and albuminuria creatinine ratio less than 30 mg/g (HCC)  N18.32      Meds ordered this encounter  Medications   allopurinol (ZYLOPRIM) 100 MG tablet    Sig: Take 1 tablet (100 mg total) by mouth daily.    Dispense:  90 tablet    Refill:  3   Return precautions advised.  [name], MD

## 2021-09-05 ENCOUNTER — Other Ambulatory Visit (HOSPITAL_COMMUNITY): Payer: Self-pay

## 2021-09-05 ENCOUNTER — Other Ambulatory Visit: Payer: Self-pay | Admitting: Family Medicine

## 2021-09-09 ENCOUNTER — Ambulatory Visit (INDEPENDENT_AMBULATORY_CARE_PROVIDER_SITE_OTHER): Payer: Medicare Other | Admitting: Family Medicine

## 2021-09-09 ENCOUNTER — Encounter: Payer: Self-pay | Admitting: Family Medicine

## 2021-09-09 ENCOUNTER — Other Ambulatory Visit: Payer: Self-pay

## 2021-09-09 VITALS — BP 122/64 | HR 69 | Temp 97.2°F | Ht 75.0 in | Wt 173.2 lb

## 2021-09-09 DIAGNOSIS — M1A00X0 Idiopathic chronic gout, unspecified site, without tophus (tophi): Secondary | ICD-10-CM | POA: Diagnosis not present

## 2021-09-09 DIAGNOSIS — N1832 Chronic kidney disease, stage 3b: Secondary | ICD-10-CM | POA: Diagnosis not present

## 2021-09-09 DIAGNOSIS — C3491 Malignant neoplasm of unspecified part of right bronchus or lung: Secondary | ICD-10-CM

## 2021-09-09 DIAGNOSIS — D696 Thrombocytopenia, unspecified: Secondary | ICD-10-CM | POA: Diagnosis not present

## 2021-09-09 DIAGNOSIS — J449 Chronic obstructive pulmonary disease, unspecified: Secondary | ICD-10-CM

## 2021-09-09 DIAGNOSIS — I10 Essential (primary) hypertension: Secondary | ICD-10-CM

## 2021-09-09 DIAGNOSIS — E1151 Type 2 diabetes mellitus with diabetic peripheral angiopathy without gangrene: Secondary | ICD-10-CM | POA: Diagnosis not present

## 2021-09-09 DIAGNOSIS — J9611 Chronic respiratory failure with hypoxia: Secondary | ICD-10-CM | POA: Diagnosis not present

## 2021-09-09 DIAGNOSIS — C7A8 Other malignant neuroendocrine tumors: Secondary | ICD-10-CM | POA: Diagnosis not present

## 2021-09-09 DIAGNOSIS — C7B8 Other secondary neuroendocrine tumors: Secondary | ICD-10-CM

## 2021-09-09 LAB — POCT GLYCOSYLATED HEMOGLOBIN (HGB A1C): Hemoglobin A1C: 6.6 % — AB (ref 4.0–5.6)

## 2021-09-09 MED ORDER — ALLOPURINOL 100 MG PO TABS: 100.0000 mg | ORAL_TABLET | Freq: Every day | ORAL | 3 refills | Status: DC

## 2021-09-09 NOTE — Patient Instructions (Addendum)
Glad you are doing reasonably well. Congrats on good a1c Lab Results  Component Value Date   HGBA1C 6.6 (A) 09/09/2021  No changes today  Recommended follow up: Return in about 4 months (around 01/07/2022) for follow up- or sooner if needed.

## 2021-09-15 ENCOUNTER — Ambulatory Visit (HOSPITAL_COMMUNITY)
Admission: RE | Admit: 2021-09-15 | Discharge: 2021-09-15 | Disposition: A | Discharge status: Home / Self Care | Payer: Medicare Other | Source: Ambulatory Visit | Attending: Interventional Radiology | Admitting: Interventional Radiology

## 2021-09-15 DIAGNOSIS — C7B8 Other secondary neuroendocrine tumors: Secondary | ICD-10-CM | POA: Insufficient documentation

## 2021-09-15 DIAGNOSIS — N281 Cyst of kidney, acquired: Secondary | ICD-10-CM | POA: Diagnosis not present

## 2021-09-15 DIAGNOSIS — K449 Diaphragmatic hernia without obstruction or gangrene: Secondary | ICD-10-CM | POA: Diagnosis not present

## 2021-09-15 DIAGNOSIS — C7A8 Other malignant neuroendocrine tumors: Secondary | ICD-10-CM | POA: Diagnosis not present

## 2021-09-15 DIAGNOSIS — C3491 Malignant neoplasm of unspecified part of right bronchus or lung: Secondary | ICD-10-CM | POA: Diagnosis not present

## 2021-09-15 MED ORDER — GADOBUTROL 1 MMOL/ML IV SOLN
7.0000 mL | Freq: Once | INTRAVENOUS | Status: AC | PRN
  Administered 2021-09-15: 7 mL via INTRAVENOUS

## 2021-09-21 ENCOUNTER — Encounter: Payer: Self-pay | Admitting: *Deleted

## 2021-09-21 ENCOUNTER — Other Ambulatory Visit: Payer: Self-pay

## 2021-09-21 ENCOUNTER — Other Ambulatory Visit (HOSPITAL_COMMUNITY): Payer: Self-pay

## 2021-09-21 ENCOUNTER — Ambulatory Visit
Admission: RE | Admit: 2021-09-21 | Discharge: 2021-09-21 | Disposition: A | Discharge status: Home / Self Care | Payer: Medicare Other | Source: Ambulatory Visit | Attending: Interventional Radiology | Admitting: Interventional Radiology

## 2021-09-21 DIAGNOSIS — C7A090 Malignant carcinoid tumor of the bronchus and lung: Secondary | ICD-10-CM | POA: Diagnosis not present

## 2021-09-21 DIAGNOSIS — C7A8 Other malignant neuroendocrine tumors: Secondary | ICD-10-CM

## 2021-09-21 DIAGNOSIS — Z9889 Other specified postprocedural states: Secondary | ICD-10-CM | POA: Diagnosis not present

## 2021-09-21 DIAGNOSIS — C7B02 Secondary carcinoid tumors of liver: Secondary | ICD-10-CM | POA: Diagnosis not present

## 2021-09-21 HISTORY — PX: IR RADIOLOGIST EVAL & MGMT: IMG5224
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
## 2021-09-23 DIAGNOSIS — Z20822 Contact with and (suspected) exposure to covid-19: Secondary | ICD-10-CM | POA: Diagnosis not present

## 2021-09-26 ENCOUNTER — Inpatient Hospital Stay: Payer: Medicare Other | Attending: Internal Medicine

## 2021-09-26 ENCOUNTER — Inpatient Hospital Stay (HOSPITAL_BASED_OUTPATIENT_CLINIC_OR_DEPARTMENT_OTHER): Payer: Medicare Other | Admitting: Internal Medicine

## 2021-09-26 ENCOUNTER — Other Ambulatory Visit: Payer: Self-pay

## 2021-09-26 VITALS — BP 148/97 | HR 77 | Temp 97.6°F | Resp 18 | Ht 75.0 in | Wt 175.9 lb

## 2021-09-26 DIAGNOSIS — C7B02 Secondary carcinoid tumors of liver: Secondary | ICD-10-CM | POA: Insufficient documentation

## 2021-09-26 DIAGNOSIS — C7B03 Secondary carcinoid tumors of bone: Secondary | ICD-10-CM | POA: Insufficient documentation

## 2021-09-26 DIAGNOSIS — R1310 Dysphagia, unspecified: Secondary | ICD-10-CM | POA: Insufficient documentation

## 2021-09-26 DIAGNOSIS — Z5111 Encounter for antineoplastic chemotherapy: Secondary | ICD-10-CM

## 2021-09-26 DIAGNOSIS — C7A090 Malignant carcinoid tumor of the bronchus and lung: Secondary | ICD-10-CM | POA: Diagnosis not present

## 2021-09-26 DIAGNOSIS — Z79899 Other long term (current) drug therapy: Secondary | ICD-10-CM | POA: Insufficient documentation

## 2021-09-26 DIAGNOSIS — C7A8 Other malignant neuroendocrine tumors: Secondary | ICD-10-CM

## 2021-09-26 DIAGNOSIS — Z7984 Long term (current) use of oral hypoglycemic drugs: Secondary | ICD-10-CM | POA: Insufficient documentation

## 2021-09-26 DIAGNOSIS — C3491 Malignant neoplasm of unspecified part of right bronchus or lung: Secondary | ICD-10-CM

## 2021-09-26 LAB — CMP (CANCER CENTER ONLY)
ALT: 12 U/L (ref 0–44)
AST: 14 U/L — ABNORMAL LOW (ref 15–41)
Albumin: 4 g/dL (ref 3.5–5.0)
Alkaline Phosphatase: 73 U/L (ref 38–126)
Anion gap: 9 (ref 5–15)
BUN: 16 mg/dL (ref 8–23)
CO2: 26 mmol/L (ref 22–32)
Calcium: 9.1 mg/dL (ref 8.9–10.3)
Chloride: 104 mmol/L (ref 98–111)
Creatinine: 1.45 mg/dL — ABNORMAL HIGH (ref 0.61–1.24)
GFR, Estimated: 50 mL/min — ABNORMAL LOW (ref >60)
Glucose, Bld: 132 mg/dL — ABNORMAL HIGH (ref 70–99)
Potassium: 3.7 mmol/L (ref 3.5–5.1)
Sodium: 139 mmol/L (ref 135–145)
Total Bilirubin: 0.5 mg/dL (ref 0.3–1.2)
Total Protein: 7.9 g/dL (ref 6.5–8.1)

## 2021-09-26 LAB — CBC WITH DIFFERENTIAL (CANCER CENTER ONLY)
Abs Immature Granulocytes: 0.01 10*3/uL (ref 0.00–0.07)
Basophils Absolute: 0 10*3/uL (ref 0.0–0.1)
Basophils Relative: 0 %
Eosinophils Absolute: 0 10*3/uL (ref 0.0–0.5)
Eosinophils Relative: 1 %
HCT: 35.3 % — ABNORMAL LOW (ref 39.0–52.0)
Hemoglobin: 11.8 g/dL — ABNORMAL LOW (ref 13.0–17.0)
Immature Granulocytes: 0 %
Lymphocytes Relative: 20 %
Lymphs Abs: 0.8 10*3/uL (ref 0.7–4.0)
MCH: 31.7 pg (ref 26.0–34.0)
MCHC: 33.4 g/dL (ref 30.0–36.0)
MCV: 94.9 fL (ref 80.0–100.0)
Monocytes Absolute: 0.4 10*3/uL (ref 0.1–1.0)
Monocytes Relative: 11 %
Neutro Abs: 2.8 10*3/uL (ref 1.7–7.7)
Neutrophils Relative %: 68 %
Platelet Count: 155 10*3/uL (ref 150–400)
RBC: 3.72 MIL/uL — ABNORMAL LOW (ref 4.22–5.81)
RDW: 18.9 % — ABNORMAL HIGH (ref 11.5–15.5)
WBC Count: 4.1 10*3/uL (ref 4.0–10.5)
nRBC: 0 % (ref 0.0–0.2)
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
## 2021-09-28 ENCOUNTER — Other Ambulatory Visit (HOSPITAL_COMMUNITY): Payer: Self-pay

## 2021-10-03 ENCOUNTER — Other Ambulatory Visit (HOSPITAL_COMMUNITY): Payer: Self-pay

## 2021-10-17 ENCOUNTER — Other Ambulatory Visit (HOSPITAL_COMMUNITY): Payer: Self-pay

## 2021-10-19 DIAGNOSIS — E119 Type 2 diabetes mellitus without complications: Secondary | ICD-10-CM | POA: Diagnosis not present

## 2021-10-19 DIAGNOSIS — Z961 Presence of intraocular lens: Secondary | ICD-10-CM | POA: Diagnosis not present

## 2021-10-21 ENCOUNTER — Encounter (HOSPITAL_COMMUNITY): Payer: Self-pay

## 2021-10-21 ENCOUNTER — Inpatient Hospital Stay: Payer: Medicare Other | Attending: Internal Medicine

## 2021-10-21 ENCOUNTER — Other Ambulatory Visit: Payer: Self-pay

## 2021-10-21 ENCOUNTER — Other Ambulatory Visit (HOSPITAL_COMMUNITY): Payer: Self-pay

## 2021-10-21 ENCOUNTER — Ambulatory Visit (HOSPITAL_COMMUNITY)
Admission: RE | Admit: 2021-10-21 | Discharge: 2021-10-21 | Disposition: A | Discharge status: Home / Self Care | Payer: Medicare Other | Source: Ambulatory Visit | Attending: Internal Medicine | Admitting: Internal Medicine

## 2021-10-21 DIAGNOSIS — J439 Emphysema, unspecified: Secondary | ICD-10-CM | POA: Diagnosis not present

## 2021-10-21 DIAGNOSIS — N189 Chronic kidney disease, unspecified: Secondary | ICD-10-CM | POA: Insufficient documentation

## 2021-10-21 DIAGNOSIS — J90 Pleural effusion, not elsewhere classified: Secondary | ICD-10-CM | POA: Diagnosis not present

## 2021-10-21 DIAGNOSIS — C7B02 Secondary carcinoid tumors of liver: Secondary | ICD-10-CM | POA: Insufficient documentation

## 2021-10-21 DIAGNOSIS — C7A090 Malignant carcinoid tumor of the bronchus and lung: Secondary | ICD-10-CM | POA: Insufficient documentation

## 2021-10-21 DIAGNOSIS — C7B03 Secondary carcinoid tumors of bone: Secondary | ICD-10-CM | POA: Insufficient documentation

## 2021-10-21 DIAGNOSIS — C7A8 Other malignant neuroendocrine tumors: Secondary | ICD-10-CM

## 2021-10-21 DIAGNOSIS — I700 Atherosclerosis of aorta: Secondary | ICD-10-CM | POA: Diagnosis not present

## 2021-10-21 DIAGNOSIS — Z7984 Long term (current) use of oral hypoglycemic drugs: Secondary | ICD-10-CM | POA: Insufficient documentation

## 2021-10-21 DIAGNOSIS — Z79899 Other long term (current) drug therapy: Secondary | ICD-10-CM | POA: Insufficient documentation

## 2021-10-21 DIAGNOSIS — R1310 Dysphagia, unspecified: Secondary | ICD-10-CM | POA: Insufficient documentation

## 2021-10-21 LAB — CMP (CANCER CENTER ONLY)
ALT: 10 U/L (ref 0–44)
AST: 13 U/L — ABNORMAL LOW (ref 15–41)
Albumin: 3.9 g/dL (ref 3.5–5.0)
Alkaline Phosphatase: 66 U/L (ref 38–126)
Anion gap: 6 (ref 5–15)
BUN: 16 mg/dL (ref 8–23)
CO2: 28 mmol/L (ref 22–32)
Calcium: 9.2 mg/dL (ref 8.9–10.3)
Chloride: 105 mmol/L (ref 98–111)
Creatinine: 1.57 mg/dL — ABNORMAL HIGH (ref 0.61–1.24)
GFR, Estimated: 45 mL/min — ABNORMAL LOW (ref >60)
Glucose, Bld: 140 mg/dL — ABNORMAL HIGH (ref 70–99)
Potassium: 3.5 mmol/L (ref 3.5–5.1)
Sodium: 139 mmol/L (ref 135–145)
Total Bilirubin: 0.5 mg/dL (ref 0.3–1.2)
Total Protein: 7.5 g/dL (ref 6.5–8.1)

## 2021-10-21 LAB — CBC WITH DIFFERENTIAL (CANCER CENTER ONLY)
Abs Immature Granulocytes: 0.03 10*3/uL (ref 0.00–0.07)
Basophils Absolute: 0 10*3/uL (ref 0.0–0.1)
Basophils Relative: 1 %
Eosinophils Absolute: 0 10*3/uL (ref 0.0–0.5)
Eosinophils Relative: 1 %
HCT: 32.6 % — ABNORMAL LOW (ref 39.0–52.0)
Hemoglobin: 11.1 g/dL — ABNORMAL LOW (ref 13.0–17.0)
Immature Granulocytes: 1 %
Lymphocytes Relative: 16 %
Lymphs Abs: 0.6 10*3/uL — ABNORMAL LOW (ref 0.7–4.0)
MCH: 32.6 pg (ref 26.0–34.0)
MCHC: 34 g/dL (ref 30.0–36.0)
MCV: 95.6 fL (ref 80.0–100.0)
Monocytes Absolute: 0.4 10*3/uL (ref 0.1–1.0)
Monocytes Relative: 10 %
Neutro Abs: 2.6 10*3/uL (ref 1.7–7.7)
Neutrophils Relative %: 71 %
Platelet Count: 188 10*3/uL (ref 150–400)
RBC: 3.41 MIL/uL — ABNORMAL LOW (ref 4.22–5.81)
RDW: 18.6 % — ABNORMAL HIGH (ref 11.5–15.5)
WBC Count: 3.7 10*3/uL — ABNORMAL LOW (ref 4.0–10.5)
nRBC: 0 % (ref 0.0–0.2)

## 2021-10-24 ENCOUNTER — Other Ambulatory Visit: Payer: Self-pay | Admitting: Emergency Medicine

## 2021-10-25 ENCOUNTER — Inpatient Hospital Stay (HOSPITAL_BASED_OUTPATIENT_CLINIC_OR_DEPARTMENT_OTHER): Payer: Medicare Other | Admitting: Internal Medicine

## 2021-10-25 ENCOUNTER — Other Ambulatory Visit: Payer: Self-pay

## 2021-10-25 ENCOUNTER — Encounter: Payer: Self-pay | Admitting: Internal Medicine

## 2021-10-25 VITALS — BP 141/75 | HR 80 | Temp 97.7°F | Resp 16 | Wt 179.1 lb

## 2021-10-25 DIAGNOSIS — C7A8 Other malignant neuroendocrine tumors: Secondary | ICD-10-CM | POA: Diagnosis not present

## 2021-10-25 DIAGNOSIS — C7B03 Secondary carcinoid tumors of bone: Secondary | ICD-10-CM | POA: Diagnosis not present

## 2021-10-25 DIAGNOSIS — C7B02 Secondary carcinoid tumors of liver: Secondary | ICD-10-CM | POA: Diagnosis not present

## 2021-10-25 DIAGNOSIS — C7A090 Malignant carcinoid tumor of the bronchus and lung: Secondary | ICD-10-CM | POA: Diagnosis not present

## 2021-10-25 DIAGNOSIS — N189 Chronic kidney disease, unspecified: Secondary | ICD-10-CM | POA: Diagnosis not present

## 2021-10-25 DIAGNOSIS — R1310 Dysphagia, unspecified: Secondary | ICD-10-CM | POA: Diagnosis not present

## 2021-10-25 DIAGNOSIS — Z7984 Long term (current) use of oral hypoglycemic drugs: Secondary | ICD-10-CM | POA: Diagnosis not present

## 2021-10-25 DIAGNOSIS — Z79899 Other long term (current) drug therapy: Secondary | ICD-10-CM | POA: Diagnosis not present
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
## 2021-10-26 ENCOUNTER — Other Ambulatory Visit: Payer: Self-pay | Admitting: Family Medicine

## 2021-10-26 ENCOUNTER — Other Ambulatory Visit (HOSPITAL_COMMUNITY): Payer: Self-pay

## 2021-11-03 ENCOUNTER — Other Ambulatory Visit: Payer: Self-pay

## 2021-11-03 MED ORDER — ALLOPURINOL 100 MG PO TABS: 100.0000 mg | ORAL_TABLET | Freq: Every day | ORAL | 3 refills | Status: DC

## 2021-11-08 DIAGNOSIS — Z20822 Contact with and (suspected) exposure to covid-19: Secondary | ICD-10-CM | POA: Diagnosis not present

## 2021-11-10 ENCOUNTER — Other Ambulatory Visit: Payer: Self-pay

## 2021-11-10 ENCOUNTER — Ambulatory Visit (INDEPENDENT_AMBULATORY_CARE_PROVIDER_SITE_OTHER): Payer: Medicare Other

## 2021-11-10 VITALS — BP 128/66 | HR 78 | Temp 98.7°F | Wt 179.0 lb

## 2021-11-10 DIAGNOSIS — Z0000 Encounter for general adult medical examination without abnormal findings: Secondary | ICD-10-CM
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
## 2021-11-10 NOTE — Patient Instructions (Signed)
Richard Navarro , ?Thank you for taking time to come for your Medicare Wellness Visit. I appreciate your ongoing commitment to your health goals. Please review the following plan we discussed and let me know if I can assist you in the future.  ? ?Screening recommendations/referrals: ?Colonoscopy: No longer required  ?Recommended yearly ophthalmology/optometry visit for glaucoma screening and checkup ?Recommended yearly dental visit for hygiene and checkup ? ?Vaccinations: ?Influenza vaccine: Done 06/23/21 repeat every year  ?Pneumococcal vaccine: Up to date ?Tdap vaccine: Due and discussed  ?Shingles vaccine: Shingrix discussed. Please contact your pharmacy for coverage information.    ?Covid-19: Completed 2/5, 2/26, 04/17/20, & 5/24, 06/06/21 ? ?Advanced directives: Advance directive discussed with you today. I have provided a copy for you to complete at home and have notarized. Once this is complete please bring a copy in to our office so we can scan it into your chart. ? ?Conditions/risks identified: None at this time  ? ?Next appointment: Follow up in one year for your annual wellness visit.  ? ?Preventive Care 52 Years and Older, Male ?Preventive care refers to lifestyle choices and visits with your health care provider that can promote health and wellness. ?What does preventive care include? ?A yearly physical exam. This is also called an annual well check. ?Dental exams once or twice a year. ?Routine eye exams. Ask your health care provider how often you should have your eyes checked. ?Personal lifestyle choices, including: ?Daily care of your teeth and gums. ?Regular physical activity. ?Eating a healthy diet. ?Avoiding tobacco and drug use. ?Limiting alcohol use. ?Practicing safe sex. ?Taking low doses of aspirin every day. ?Taking vitamin and mineral supplements as recommended by your health care provider. ?What happens during an annual well check? ?The services and screenings done by your health care provider  during your annual well check will depend on your age, overall health, lifestyle risk factors, and family history of disease. ?Counseling  ?Your health care provider may ask you questions about your: ?Alcohol use. ?Tobacco use. ?Drug use. ?Emotional well-being. ?Home and relationship well-being. ?Sexual activity. ?Eating habits. ?History of falls. ?Memory and ability to understand (cognition). ?Work and work Statistician. ?Screening  ?You may have the following tests or measurements: ?Height, weight, and BMI. ?Blood pressure. ?Lipid and cholesterol levels. These may be checked every 5 years, or more frequently if you are over 46 years old. ?Skin check. ?Lung cancer screening. You may have this screening every year starting at age 8 if you have a 30-pack-year history of smoking and currently smoke or have quit within the past 15 years. ?Fecal occult blood test (FOBT) of the stool. You may have this test every year starting at age 37. ?Flexible sigmoidoscopy or colonoscopy. You may have a sigmoidoscopy every 5 years or a colonoscopy every 10 years starting at age 51. ?Prostate cancer screening. Recommendations will vary depending on your family history and other risks. ?Hepatitis C blood test. ?Hepatitis B blood test. ?Sexually transmitted disease (STD) testing. ?Diabetes screening. This is done by checking your blood sugar (glucose) after you have not eaten for a while (fasting). You may have this done every 1-3 years. ?Abdominal aortic aneurysm (AAA) screening. You may need this if you are a current or former smoker. ?Osteoporosis. You may be screened starting at age 82 if you are at high risk. ?Talk with your health care provider about your test results, treatment options, and if necessary, the need for more tests. ?Vaccines  ?Your health care provider may recommend certain  vaccines, such as: ?Influenza vaccine. This is recommended every year. ?Tetanus, diphtheria, and acellular pertussis (Tdap, Td) vaccine. You  may need a Td booster every 10 years. ?Zoster vaccine. You may need this after age 61. ?Pneumococcal 13-valent conjugate (PCV13) vaccine. One dose is recommended after age 1. ?Pneumococcal polysaccharide (PPSV23) vaccine. One dose is recommended after age 61. ?Talk to your health care provider about which screenings and vaccines you need and how often you need them. ?This information is not intended to replace advice given to you by your health care provider. Make sure you discuss any questions you have with your health care provider. ?Document Released: 09/03/2015 Document Revised: 04/26/2016 Document Reviewed: 06/08/2015 ?Elsevier Interactive Patient Education ? 2017 [place]. ? ?Fall Prevention in the Home ?Falls can cause injuries. They can happen to people of all ages. There are many things you can do to make your home safe and to help prevent falls. ?What can I do on the outside of my home? ?Regularly fix the edges of walkways and driveways and fix any cracks. ?Remove anything that might make you trip as you walk through a door, such as a raised step or threshold. ?Trim any bushes or trees on the path to your home. ?Use bright outdoor lighting. ?Clear any walking paths of anything that might make someone trip, such as rocks or tools. ?Regularly check to see if handrails are loose or broken. Make sure that both sides of any steps have handrails. ?Any raised decks and porches should have guardrails on the edges. ?Have any leaves, snow, or ice cleared regularly. ?Use sand or salt on walking paths during winter. ?Clean up any spills in your garage right away. This includes oil or grease spills. ?What can I do in the bathroom? ?Use night lights. ?Install grab bars by the toilet and in the tub and shower. Do not use towel bars as grab bars. ?Use non-skid mats or decals in the tub or shower. ?If you need to sit down in the shower, use a plastic, non-slip stool. ?Keep the floor dry. Clean up any water that spills  on the floor as soon as it happens. ?Remove soap buildup in the tub or shower regularly. ?Attach bath mats securely with double-sided non-slip rug tape. ?Do not have throw rugs and other things on the floor that can make you trip. ?What can I do in the bedroom? ?Use night lights. ?Make sure that you have a light by your bed that is easy to reach. ?Do not use any sheets or blankets that are too big for your bed. They should not hang down onto the floor. ?Have a firm chair that has side arms. You can use this for support while you get dressed. ?Do not have throw rugs and other things on the floor that can make you trip. ?What can I do in the kitchen? ?Clean up any spills right away. ?Avoid walking on wet floors. ?Keep items that you use a lot in easy-to-reach places. ?If you need to reach something above you, use a strong step stool that has a grab bar. ?Keep electrical cords out of the way. ?Do not use floor polish or wax that makes floors slippery. If you must use wax, use non-skid floor wax. ?Do not have throw rugs and other things on the floor that can make you trip. ?What can I do with my stairs? ?Do not leave any items on the stairs. ?Make sure that there are handrails on both sides of the stairs  and use them. Fix handrails that are broken or loose. Make sure that handrails are as long as the stairways. ?Check any carpeting to make sure that it is firmly attached to the stairs. Fix any carpet that is loose or worn. ?Avoid having throw rugs at the top or bottom of the stairs. If you do have throw rugs, attach them to the floor with carpet tape. ?Make sure that you have a light switch at the top of the stairs and the bottom of the stairs. If you do not have them, ask someone to add them for you. ?What else can I do to help prevent falls? ?Wear shoes that: ?Do not have high heels. ?Have rubber bottoms. ?Are comfortable and fit you well. ?Are closed at the toe. Do not wear sandals. ?If you use a stepladder: ?Make  sure that it is fully opened. Do not climb a closed stepladder. ?Make sure that both sides of the stepladder are locked into place. ?Ask someone to hold it for you, if possible. ?Clearly mark and make sur

## 2021-11-11 ENCOUNTER — Other Ambulatory Visit (HOSPITAL_COMMUNITY): Payer: Self-pay

## 2021-11-14 ENCOUNTER — Other Ambulatory Visit (HOSPITAL_COMMUNITY): Payer: Self-pay
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
## 2021-11-17 ENCOUNTER — Other Ambulatory Visit (HOSPITAL_COMMUNITY): Payer: Self-pay

## 2021-11-21 ENCOUNTER — Other Ambulatory Visit: Payer: Self-pay | Admitting: Family Medicine

## 2021-11-22 ENCOUNTER — Other Ambulatory Visit (HOSPITAL_COMMUNITY): Payer: Self-pay

## 2021-11-22 ENCOUNTER — Encounter: Payer: Self-pay | Admitting: Internal Medicine

## 2021-11-22 ENCOUNTER — Inpatient Hospital Stay (HOSPITAL_BASED_OUTPATIENT_CLINIC_OR_DEPARTMENT_OTHER): Payer: Medicare Other | Admitting: Internal Medicine

## 2021-11-22 ENCOUNTER — Other Ambulatory Visit: Payer: Self-pay

## 2021-11-22 ENCOUNTER — Inpatient Hospital Stay: Payer: Medicare Other | Attending: Internal Medicine

## 2021-11-22 VITALS — BP 155/80 | HR 81 | Temp 97.6°F | Resp 18 | Wt 177.2 lb

## 2021-11-22 DIAGNOSIS — C7B02 Secondary carcinoid tumors of liver: Secondary | ICD-10-CM | POA: Diagnosis not present

## 2021-11-22 DIAGNOSIS — C7A8 Other malignant neuroendocrine tumors: Secondary | ICD-10-CM | POA: Diagnosis not present

## 2021-11-22 DIAGNOSIS — C3491 Malignant neoplasm of unspecified part of right bronchus or lung: Secondary | ICD-10-CM

## 2021-11-22 DIAGNOSIS — C7A090 Malignant carcinoid tumor of the bronchus and lung: Secondary | ICD-10-CM | POA: Insufficient documentation

## 2021-11-22 DIAGNOSIS — C7B8 Other secondary neuroendocrine tumors: Secondary | ICD-10-CM

## 2021-11-22 DIAGNOSIS — Z5111 Encounter for antineoplastic chemotherapy: Secondary | ICD-10-CM | POA: Diagnosis not present

## 2021-11-22 DIAGNOSIS — C7B03 Secondary carcinoid tumors of bone: Secondary | ICD-10-CM | POA: Diagnosis not present

## 2021-11-22 LAB — CMP (CANCER CENTER ONLY)
ALT: 8 U/L (ref 0–44)
AST: 12 U/L — ABNORMAL LOW (ref 15–41)
Albumin: 4 g/dL (ref 3.5–5.0)
Alkaline Phosphatase: 70 U/L (ref 38–126)
Anion gap: 7 (ref 5–15)
BUN: 19 mg/dL (ref 8–23)
CO2: 27 mmol/L (ref 22–32)
Calcium: 9.2 mg/dL (ref 8.9–10.3)
Chloride: 106 mmol/L (ref 98–111)
Creatinine: 1.51 mg/dL — ABNORMAL HIGH (ref 0.61–1.24)
GFR, Estimated: 47 mL/min — ABNORMAL LOW (ref >60)
Glucose, Bld: 128 mg/dL — ABNORMAL HIGH (ref 70–99)
Potassium: 3.8 mmol/L (ref 3.5–5.1)
Sodium: 140 mmol/L (ref 135–145)
Total Bilirubin: 0.4 mg/dL (ref 0.3–1.2)
Total Protein: 7.9 g/dL (ref 6.5–8.1)

## 2021-11-22 LAB — CBC WITH DIFFERENTIAL (CANCER CENTER ONLY)
Abs Immature Granulocytes: 0.02 10*3/uL (ref 0.00–0.07)
Basophils Absolute: 0 10*3/uL (ref 0.0–0.1)
Basophils Relative: 0 %
Eosinophils Absolute: 0 10*3/uL (ref 0.0–0.5)
Eosinophils Relative: 1 %
HCT: 34 % — ABNORMAL LOW (ref 39.0–52.0)
Hemoglobin: 11.9 g/dL — ABNORMAL LOW (ref 13.0–17.0)
Immature Granulocytes: 1 %
Lymphocytes Relative: 23 %
Lymphs Abs: 0.8 10*3/uL (ref 0.7–4.0)
MCH: 34.1 pg — ABNORMAL HIGH (ref 26.0–34.0)
MCHC: 35 g/dL (ref 30.0–36.0)
MCV: 97.4 fL (ref 80.0–100.0)
Monocytes Absolute: 0.4 10*3/uL (ref 0.1–1.0)
Monocytes Relative: 12 %
Neutro Abs: 2.1 10*3/uL (ref 1.7–7.7)
Neutrophils Relative %: 63 %
Platelet Count: 168 10*3/uL (ref 150–400)
RBC: 3.49 MIL/uL — ABNORMAL LOW (ref 4.22–5.81)
RDW: 16.6 % — ABNORMAL HIGH (ref 11.5–15.5)
Smear Review: NORMAL
WBC Count: 3.3 10*3/uL — ABNORMAL LOW (ref 4.0–10.5)
nRBC: 0 % (ref 0.0–0.2)
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
## 2021-11-25 DIAGNOSIS — Z1152 Encounter for screening for COVID-19: Secondary | ICD-10-CM | POA: Diagnosis not present

## 2021-11-25 DIAGNOSIS — Z20828 Contact with and (suspected) exposure to other viral communicable diseases: Secondary | ICD-10-CM | POA: Diagnosis not present

## 2021-11-29 ENCOUNTER — Ambulatory Visit (INDEPENDENT_AMBULATORY_CARE_PROVIDER_SITE_OTHER): Payer: Medicare Other | Admitting: Podiatry

## 2021-11-29 ENCOUNTER — Encounter: Payer: Self-pay | Admitting: Podiatry

## 2021-11-29 DIAGNOSIS — B351 Tinea unguium: Secondary | ICD-10-CM | POA: Diagnosis not present

## 2021-11-29 DIAGNOSIS — M79674 Pain in right toe(s): Secondary | ICD-10-CM | POA: Diagnosis not present

## 2021-11-29 DIAGNOSIS — M79675 Pain in left toe(s): Secondary | ICD-10-CM

## 2021-11-29 DIAGNOSIS — E1151 Type 2 diabetes mellitus with diabetic peripheral angiopathy without gangrene: Secondary | ICD-10-CM | POA: Diagnosis not present

## 2021-12-02 ENCOUNTER — Other Ambulatory Visit: Payer: Self-pay | Admitting: Family Medicine

## 2021-12-05 DIAGNOSIS — Z20822 Contact with and (suspected) exposure to covid-19: Secondary | ICD-10-CM | POA: Diagnosis not present

## 2021-12-05 NOTE — Progress Notes (Signed)
?  Subjective:  ?Patient ID: Richard Navarro, male    DOB: Sep 29, 1945,  MRN: [id number] ? ?[name] presents to clinic today for at risk foot care. Pt has h/o NIDDM with PAD and painful elongated mycotic toenails 1-5 bilaterally which are tender when wearing enclosed shoe gear. Pain is relieved with periodic professional debridement. ? ?Patient did not check blood glucose today. ? ?New problem(s): None.  ? ?He continues to see Oncology for chemotherapy to manage metastatic neuroendocrine carcinoma and primary lung cancer.  ? ?PCP is [name], MD , and last visit was September 09, 2021. ? ?Allergies  ?Allergen Reactions  ? Lisinopril Swelling  ?  Angioedema- on this and afinitor same time  ? Simvastatin Other (See Comments)  ?  Joint ache  ? ?Review of Systems: Negative except as noted in the HPI. ? ?Objective: No changes noted in today's physical examination. ?Constitutional Richard Navarro is a pleasant 76 y.o. African American male, thin build in NAD. AAO x 3.   ?Vascular CFT <3 seconds b/l LE. Faintly palpable DP pulses b/l LE. Nonpalpable PT pulse(s) b/l LE. Pedal hair sparse. No pain with calf compression b/l. No edema noted b/l LE. No ischemia or gangrene noted b/l LE. No cyanosis or clubbing noted b/l LE.  ?Neurologic Normal speech. Oriented to person, place, and time. Protective sensation intact 5/5 intact bilaterally with 10g monofilament b/l.  ?Dermatologic Pedal skin thin and atrophic b/l LE. No open wounds b/l LE. No interdigital macerations noted b/l LE. Toenails 1-5 b/l elongated, discolored, dystrophic, thickened, crumbly with subungual debris and tenderness to dorsal palpation.  ?Orthopedic: Muscle strength 5/5 to all lower extremity muscle groups bilaterally. No pain, crepitus or joint limitation noted with ROM bilateral LE. Clawtoe deformity 2-5 bilaterally. Patient ambulates independent of any assistive aids.  ? ?Radiographs: None ? ?  Latest Ref Rng & Units 09/09/2021  ?  2:34 PM  04/17/2021  ?  8:17 AM 12/28/2020  ? 12:23 PM  ?Hemoglobin A1C  ?Hemoglobin-A1c 4.0 - 5.6 % 6.6   7.0   7.1    ? ?Assessment/Plan: ?1. Pain due to onychomycosis of toenails of both feet   ?2. Type II diabetes mellitus with peripheral circulatory disorder (HCC)   ?  ?-Patient was evaluated and treated. All patient's and/or POA's questions/concerns answered on today's visit. ?-Patient to continue soft, supportive shoe gear daily. ?-Toenails 1-5 b/l were debrided in length and girth with sterile nail nippers and dremel without iatrogenic bleeding.  ?-Patient/POA to call should there be question/concern in the interim.  ? ?Return in about 3 months (around 02/28/2022). ? ?Marzetta Board, DPM  ?

## 2021-12-14 DIAGNOSIS — R051 Acute cough: Secondary | ICD-10-CM | POA: Diagnosis not present

## 2021-12-14 DIAGNOSIS — Z20822 Contact with and (suspected) exposure to covid-19: Secondary | ICD-10-CM | POA: Diagnosis not present

## 2021-12-14 DIAGNOSIS — R059 Cough, unspecified: Secondary | ICD-10-CM | POA: Diagnosis not present

## 2021-12-15 ENCOUNTER — Other Ambulatory Visit: Payer: Self-pay | Admitting: Internal Medicine

## 2021-12-15 ENCOUNTER — Other Ambulatory Visit (HOSPITAL_COMMUNITY): Payer: Self-pay

## 2021-12-15 DIAGNOSIS — C7A8 Other malignant neuroendocrine tumors: Secondary | ICD-10-CM

## 2021-12-16 ENCOUNTER — Other Ambulatory Visit: Payer: Self-pay | Admitting: Internal Medicine

## 2021-12-16 ENCOUNTER — Other Ambulatory Visit (HOSPITAL_COMMUNITY): Payer: Self-pay

## 2021-12-16 DIAGNOSIS — C7A8 Other malignant neuroendocrine tumors: Secondary | ICD-10-CM

## 2021-12-16 MED ORDER — CAPECITABINE 500 MG PO TABS
ORAL_TABLET | ORAL | 2 refills | Status: DC
  Filled 2021-12-16: qty 84, 14d supply, fill #0
  Filled 2022-01-12 (×2): qty 84, 14d supply, fill #1
  Filled 2022-02-09: qty 84, 14d supply, fill #2

## 2021-12-17 ENCOUNTER — Other Ambulatory Visit (HOSPITAL_COMMUNITY): Payer: Self-pay

## 2021-12-20 ENCOUNTER — Other Ambulatory Visit (HOSPITAL_COMMUNITY): Payer: Self-pay

## 2021-12-21 ENCOUNTER — Encounter: Payer: Self-pay | Admitting: Internal Medicine

## 2021-12-21 ENCOUNTER — Inpatient Hospital Stay: Payer: Medicare Other | Attending: Internal Medicine

## 2021-12-21 ENCOUNTER — Other Ambulatory Visit: Payer: Self-pay

## 2021-12-21 ENCOUNTER — Inpatient Hospital Stay (HOSPITAL_BASED_OUTPATIENT_CLINIC_OR_DEPARTMENT_OTHER): Payer: Medicare Other | Admitting: Internal Medicine

## 2021-12-21 VITALS — BP 133/69 | HR 66 | Temp 97.3°F | Resp 19 | Wt 178.5 lb

## 2021-12-21 DIAGNOSIS — C7B02 Secondary carcinoid tumors of liver: Secondary | ICD-10-CM | POA: Insufficient documentation

## 2021-12-21 DIAGNOSIS — C7B8 Other secondary neuroendocrine tumors: Secondary | ICD-10-CM

## 2021-12-21 DIAGNOSIS — C7A8 Other malignant neuroendocrine tumors: Secondary | ICD-10-CM

## 2021-12-21 DIAGNOSIS — Z79899 Other long term (current) drug therapy: Secondary | ICD-10-CM | POA: Diagnosis not present

## 2021-12-21 DIAGNOSIS — C7B03 Secondary carcinoid tumors of bone: Secondary | ICD-10-CM | POA: Insufficient documentation

## 2021-12-21 DIAGNOSIS — C7A090 Malignant carcinoid tumor of the bronchus and lung: Secondary | ICD-10-CM | POA: Insufficient documentation

## 2021-12-21 LAB — CBC WITH DIFFERENTIAL (CANCER CENTER ONLY)
Abs Immature Granulocytes: 0.02 10*3/uL (ref 0.00–0.07)
Basophils Absolute: 0 10*3/uL (ref 0.0–0.1)
Basophils Relative: 1 %
Eosinophils Absolute: 0 10*3/uL (ref 0.0–0.5)
Eosinophils Relative: 1 %
HCT: 34 % — ABNORMAL LOW (ref 39.0–52.0)
Hemoglobin: 11.6 g/dL — ABNORMAL LOW (ref 13.0–17.0)
Immature Granulocytes: 1 %
Lymphocytes Relative: 20 %
Lymphs Abs: 0.7 10*3/uL (ref 0.7–4.0)
MCH: 33.9 pg (ref 26.0–34.0)
MCHC: 34.1 g/dL (ref 30.0–36.0)
MCV: 99.4 fL (ref 80.0–100.0)
Monocytes Absolute: 0.4 10*3/uL (ref 0.1–1.0)
Monocytes Relative: 10 %
Neutro Abs: 2.4 10*3/uL (ref 1.7–7.7)
Neutrophils Relative %: 67 %
Platelet Count: 167 10*3/uL (ref 150–400)
RBC: 3.42 MIL/uL — ABNORMAL LOW (ref 4.22–5.81)
RDW: 16.2 % — ABNORMAL HIGH (ref 11.5–15.5)
WBC Count: 3.5 10*3/uL — ABNORMAL LOW (ref 4.0–10.5)
nRBC: 0 % (ref 0.0–0.2)

## 2021-12-21 LAB — CMP (CANCER CENTER ONLY)
ALT: 9 U/L (ref 0–44)
AST: 11 U/L — ABNORMAL LOW (ref 15–41)
Albumin: 3.8 g/dL (ref 3.5–5.0)
Alkaline Phosphatase: 74 U/L (ref 38–126)
Anion gap: 7 (ref 5–15)
BUN: 17 mg/dL (ref 8–23)
CO2: 27 mmol/L (ref 22–32)
Calcium: 9.2 mg/dL (ref 8.9–10.3)
Chloride: 103 mmol/L (ref 98–111)
Creatinine: 1.79 mg/dL — ABNORMAL HIGH (ref 0.61–1.24)
GFR, Estimated: 39 mL/min — ABNORMAL LOW (ref >60)
Glucose, Bld: 178 mg/dL — ABNORMAL HIGH (ref 70–99)
Potassium: 3.9 mmol/L (ref 3.5–5.1)
Sodium: 137 mmol/L (ref 135–145)
Total Bilirubin: 0.6 mg/dL (ref 0.3–1.2)
Total Protein: 7.5 g/dL (ref 6.5–8.1)
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
## 2021-12-27 ENCOUNTER — Other Ambulatory Visit (HOSPITAL_COMMUNITY): Payer: Self-pay

## 2022-01-06 ENCOUNTER — Telehealth: Payer: Self-pay | Admitting: Internal Medicine

## 2022-01-06 ENCOUNTER — Telehealth: Payer: Self-pay

## 2022-01-06 NOTE — Telephone Encounter (Signed)
Called patient regarding upcoming June appointments, patient is notified.  

## 2022-01-06 NOTE — Chronic Care Management (AMB) (Signed)
  Care Management   Note  01/06/2022 Name: Richard Navarro MRN: [id number] DOB: 1945-11-10  Richard Navarro is a 76 y.o. year old male who is a primary care patient of [name], MD. I reached out to Richard Navarro by phone today offer care coordination services.   [name] was given information about care management services today including:  Care management services include personalized support from designated clinical staff supervised by his physician, including individualized plan of care and coordination with other care providers 24/7 contact phone numbers for assistance for urgent and routine care needs. The patient may stop care management services at any time by phone call to the office staff.  Patient agreed to services and verbal consent obtained.   Follow up plan: Telephone appointment with care management team member scheduled for:01/12/2022  Noreene Larsson, [place], [place],  [address] Direct Dial: [phone] [email or web address]: [email or web address]

## 2022-01-09 ENCOUNTER — Encounter: Payer: Self-pay | Admitting: Family Medicine

## 2022-01-09 ENCOUNTER — Ambulatory Visit (INDEPENDENT_AMBULATORY_CARE_PROVIDER_SITE_OTHER): Payer: Medicare Other | Admitting: Family Medicine

## 2022-01-09 VITALS — BP 110/58 | HR 68 | Temp 97.8°F | Ht 75.0 in | Wt 181.2 lb

## 2022-01-09 DIAGNOSIS — N1832 Chronic kidney disease, stage 3b: Secondary | ICD-10-CM | POA: Diagnosis not present

## 2022-01-09 DIAGNOSIS — E785 Hyperlipidemia, unspecified: Secondary | ICD-10-CM

## 2022-01-09 DIAGNOSIS — J9611 Chronic respiratory failure with hypoxia: Secondary | ICD-10-CM | POA: Diagnosis not present

## 2022-01-09 DIAGNOSIS — E1122 Type 2 diabetes mellitus with diabetic chronic kidney disease: Secondary | ICD-10-CM | POA: Diagnosis not present

## 2022-01-09 DIAGNOSIS — F3342 Major depressive disorder, recurrent, in full remission: Secondary | ICD-10-CM

## 2022-01-09 DIAGNOSIS — M1A00X0 Idiopathic chronic gout, unspecified site, without tophus (tophi): Secondary | ICD-10-CM

## 2022-01-09 DIAGNOSIS — I10 Essential (primary) hypertension: Secondary | ICD-10-CM

## 2022-01-09 LAB — POCT GLYCOSYLATED HEMOGLOBIN (HGB A1C): Hemoglobin A1C: 6.6 % — AB (ref 4.0–5.6)

## 2022-01-09 MED ORDER — PANTOPRAZOLE SODIUM 40 MG PO TBEC: 40.0000 mg | DELAYED_RELEASE_TABLET | Freq: Two times a day (BID) | ORAL | 3 refills | Status: DC

## 2022-01-09 MED ORDER — PANTOPRAZOLE SODIUM 40 MG PO TBEC: 40.0000 mg | DELAYED_RELEASE_TABLET | Freq: Every day | ORAL | 3 refills | Status: DC
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
## 2022-01-09 NOTE — Patient Instructions (Addendum)
Consider Tdap and shingrix vaccine at pharmacy but check with oncology on when to do these around treatments  with orthostatic symptoms encouraged to push hydration and let me know if still having dizziness with position changes and we could consider reducing amlodipine to 5 mg- continue current meds   POC a1c today- as long as below 7 he is free to go and continue current meds  Recommended follow up: Return in about 4 months (around 05/12/2022) for followup or sooner if needed.Schedule b4 you leave.

## 2022-01-12 ENCOUNTER — Ambulatory Visit: Payer: Medicare Other | Admitting: *Deleted

## 2022-01-12 ENCOUNTER — Other Ambulatory Visit (HOSPITAL_COMMUNITY): Payer: Self-pay

## 2022-01-12 ENCOUNTER — Other Ambulatory Visit: Payer: Self-pay | Admitting: Internal Medicine

## 2022-01-12 MED ORDER — TEMOZOLOMIDE 100 MG PO CAPS
ORAL_CAPSULE | ORAL | 3 refills | Status: DC
  Filled 2022-01-17: qty 20, 28d supply, fill #0
  Filled 2022-02-09: qty 20, 28d supply, fill #1
  Filled 2022-03-07: qty 20, 28d supply, fill #2
  Filled 2022-04-04: qty 20, 28d supply, fill #3

## 2022-01-12 NOTE — Chronic Care Management (AMB) (Signed)
  Care Management   Outreach Note  01/12/2022 Name: Richard Navarro MRN: [id number] DOB: [birth year]  Referred by: [name], MD Reason for referral : Care Coordination (INITIAL)   Successful telephone outreach to patient. RNCM introduced self and role.  Patient agrees to [place].  Voice is fading and patient request call another day and time.  Follow Up Plan:  The care management team will reach out to the patient again over the next 30 days.  To complete initial assessment  Hubert Azure RN, MSN RN Care Management Coordinator  [place] [phone] [email or web address]

## 2022-01-17 ENCOUNTER — Other Ambulatory Visit (HOSPITAL_COMMUNITY): Payer: Self-pay

## 2022-01-18 ENCOUNTER — Other Ambulatory Visit (HOSPITAL_COMMUNITY): Payer: Self-pay

## 2022-01-19 ENCOUNTER — Ambulatory Visit: Payer: Medicare Other | Admitting: *Deleted

## 2022-01-19 DIAGNOSIS — J9611 Chronic respiratory failure with hypoxia: Secondary | ICD-10-CM

## 2022-01-19 DIAGNOSIS — N1832 Chronic kidney disease, stage 3b: Secondary | ICD-10-CM

## 2022-01-19 NOTE — Chronic Care Management (AMB) (Signed)
Care Management    RN Visit Note  01/19/2022 Name: Richard Navarro MRN: [id number] DOB: April 08, 1946  Subjective: Richard Navarro is a 76 y.o. year old male who is a primary care patient of Richard Navarro, Richard Mars, MD. The care management team was consulted for assistance with disease management and care coordination needs.    Engaged with patient by telephone for initial visit in response to provider referral for case management and/or care coordination services.   Consent to Services:   [name] was given information about Care Management services today including:  Care Management services includes personalized support from designated clinical staff supervised by his physician, including individualized plan of care and coordination with other care providers 24/7 contact phone numbers for assistance for urgent and routine care needs. The patient may stop case management services at any time by phone call to the office staff.  Patient agreed to services and consent obtained.   Assessment: Review of patient past medical history, allergies, medications, health status, including review of consultants reports, laboratory and other test data, was performed as part of comprehensive evaluation and provision of chronic care management services.   SDOH (Social Determinants of Health) assessments and interventions performed:  SDOH Interventions    Flowsheet Row Most Recent Value  SDOH Interventions   Food Insecurity Interventions Intervention Not Indicated  Housing Interventions Intervention Not Indicated  Transportation Interventions Intervention Not Indicated        Care Plan  Allergies  Allergen Reactions   Lisinopril Swelling    Angioedema- on this and afinitor same time   Simvastatin Other (See Comments)    Joint ache    Outpatient Encounter Medications as of 01/19/2022  Medication Sig Note   albuterol (VENTOLIN HFA) 108 (90 Base) MCG/ACT inhaler Inhale 2 puffs into the lungs  every 4 (four) hours as needed for wheezing or shortness of breath.    allopurinol (ZYLOPRIM) 100 MG tablet Take 1 tablet (100 mg total) by mouth daily.    amLODipine (NORVASC) 10 MG tablet TAKE 1 TABLET DAILY    Calcium Carbonate Antacid (TUMS ULTRA 1000 PO) Take 2,000 mg by mouth in the morning and at bedtime.    capecitabine (XELODA) 500 MG tablet TAKE 3 TABLETS BY MOUTH TWICE DAILY FOR 14 DAYS EVERY 4 WEEKS.    Cholecalciferol (VITAMIN D3) 25 MCG (1000 UT) CAPS Take 1,000 Units by mouth daily.    docusate sodium (COLACE) 100 MG capsule Take 300 mg by mouth daily.    Ferrous Sulfate (IRON) 325 (65 Fe) MG TABS Take 325 mg by mouth every other day.    glipiZIDE (GLUCOTROL) 5 MG tablet TAKE 1 TABLET DAILY BEFORE BREAKFAST    guaiFENesin (MUCINEX) 600 MG 12 hr tablet Take 600 mg by mouth 2 (two) times daily.     ketotifen (ZADITOR) 0.025 % ophthalmic solution Place 1 drop into both eyes daily as needed (allergies).    magnesium hydroxide (MILK OF MAGNESIA) 400 MG/5ML suspension Take 30 mLs by mouth daily as needed (constipation).    metoprolol tartrate (LOPRESSOR) 25 MG tablet TAKE 1 TABLET TWICE A DAY    ondansetron (ZOFRAN) 8 MG tablet TAKE 1 TABLET BY MOUTH EVERY 8 HOURS AS NEEDED FOR NAUSEA AND/OR VOMITING 10/25/2021: He takes zofran 30 minutes before taking Temodar   OXYGEN Inhale 2 L into the lungs as needed (SOB).    pantoprazole (PROTONIX) 40 MG tablet Take 1 tablet (40 mg total) by mouth 2 (two) times daily.  polyethylene glycol (MIRALAX / GLYCOLAX) 17 g packet Take 17 g by mouth daily.    SPIRIVA RESPIMAT 2.5 MCG/ACT AERS USE 2 INHALATIONS DAILY    temozolomide (TEMODAR) 100 MG capsule TAKE 4 CAPS BY MOUTH DAILY DAYS 10,11,12,13 AND 14 EVERY 4 WEEKS. TAKE ON AN EMPTY STOMACH OR AT BEDTIME TO DECREASE NAUSEA & VOMITING    Tiotropium Bromide-Olodaterol (STIOLTO RESPIMAT) 2.5-2.5 MCG/ACT AERS Inhale 2 puffs into the lungs daily.    vitamin B-12 (CYANOCOBALAMIN) 1000 MCG tablet Take 1,000  mcg by mouth daily.     vitamin C (ASCORBIC ACID) 500 MG tablet Take 500 mg by mouth daily.    Continuous Blood Gluc Sensor (FREESTYLE LIBRE 14 DAY SENSOR) MISC USE TO CHECK BLOOD SUGAR AS DIRECTED AND CHANGE EVERY 14 DAYS    glucose blood test strip Use to test blood sugar twice a day    No facility-administered encounter medications on file as of 01/19/2022.    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   Tobacco use disorder 10/26/2020   Type II diabetes mellitus with peripheral circulatory disorder ([place]) 09/21/2020   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 ([place]) 02/17/2020   Sepsis due to pneumonia ([place]) 01/26/2020   Recurrent major depressive disorder, in full remission ([place]) 10/21/2019   Thrombocytopenia ([place])    Anemia 05/07/2018   Chronic kidney disease (CKD) stage G3b/A1, moderately decreased glomerular filtration rate (GFR) between 30-44 mL/min/1.73 square meter and albuminuria creatinine ratio less than 30 mg/g ([place]) 03/04/2018   Myalgia due to statin 03/04/2018   Pneumonia 08/09/2017   Neuroendocrine carcinoma ([place]) 08/09/2017   Encounter for antineoplastic chemotherapy 01/10/2017   Goals of care, counseling/discussion 01/10/2017   Primary cancer of right lung ([place]) 12/22/2016   COPD (chronic obstructive pulmonary disease) ([place]) 12/01/2016   PTSD (post-traumatic stress disorder) 12/07/2015   Prostate cancer screening 04/17/2014   Erectile dysfunction 04/09/2014   Arthritis 03/26/2014   History of  adenomatous polyp of colon 03/26/2014   Former smoker 03/26/2014   Diabetes mellitus with renal manifestation (HCC)    Essential hypertension    Gout    Depression    GERD (gastroesophageal reflux disease)    Hyperlipidemia     Conditions to be addressed/monitored: COPD and DMII  Care Plan : RNCM  General Plan of Care (Adult)  Updates made by Leona Singleton, RN since 01/19/2022 12:00 AM     Problem: KNOWLEDGE DEFICIT AND LUNG CANCER DIAGNOSIS MAKING IT DIFFICULT TO SELF MANAGE CHRONIC MEDICAL CONDITION   Priority: Medium     Long-Range Goal: PATIENT WILL WORK WITH CCM TEAM TO GAIN KNOWLEDGE TO BETTER SELF MANAGE CHRONIC MEDICAL CONDIT   Start Date: 01/19/2022  Expected End Date: 01/19/2023  Priority: Medium  Note:   Current Barriers:  Chronic Disease Management support and 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  Monitors blood sugars once a week or every 2 weeks,  A1C is 6.6.  Denies any Sob, chest  pains states wears oxygen only when SOB; has not had to use the rescue inhaler  RNCM Clinical Goal(s):  Patient will demonstrate Ongoing health management independence as evidenced by no emergency room visits and maintaining A1C <7  through collaboration with RN Care manager, provider, and care team.   Interventions: 1:1 collaboration with primary care provider regarding development and update of comprehensive plan of care as evidenced by provider attestation and co-signature Inter-disciplinary care team collaboration (see longitudinal plan of care) Evaluation of current treatment plan related to  self management and patient's adherence to plan as established by provider   COPD Interventions:  (Status:  New goal.) Long Term Goal Provided patient with basic written and verbal COPD education on self care/management/and exacerbation prevention Advised patient to track and manage COPD triggers Provided  instruction about proper use of medications used for management of COPD including inhalers Advised patient to self assesses COPD action plan zone and make appointment with provider if in the yellow zone for 48 hours without improvement Discussed the importance of adequate rest and management of fatigue with COPD   Diabetes Interventions:  (Status:  New goal.) Long Term Goal Assessed patient's understanding of A1c goal: <7% Provided education to patient about basic DM disease process Reviewed medications with patient and discussed importance of medication adherence Counseled on importance of regular laboratory monitoring as prescribed Discussed plans with patient for ongoing care management follow up and provided patient with direct contact information for care management team Advised patient, providing education and rationale, to check cbg every other day and record, calling PCP for findings outside established parameters Screening for signs and symptoms of depression related to chronic disease state  Assessed social determinant of health barriers Lab Results  Component Value Date   HGBA1C 6.6 (A) 01/09/2022   Patient Goals/Self-Care Activities: Take all medications as prescribed Attend all scheduled provider appointments Call provider office for new concerns or questions  check blood sugar at prescribed times: when you have symptoms of low or high blood sugar and EVERY OTHER DAY check feet daily for cuts, sores or redness take the blood sugar log to all doctor visits drink 6 to 8 glasses of water each day wash and dry feet carefully every day wear comfortable, well-fitting shoes limit outdoor activity during cold weather develop a rescue plan eliminate symptom triggers at home follow rescue plan if symptoms flare-up practice relaxation or meditation daily  Follow Up Plan:  The care management team will reach out to the patient again over the next 45 days.     Plan: The care  management team will reach out to the patient again over the next 45 days.  Hubert Azure RN, MSN RN Care Management Coordinator  [facility] [phone] [email or web address]

## 2022-01-19 NOTE — Patient Instructions (Addendum)
Visit Information  Thank you for taking time to visit with me today. Please don't hesitate to contact me if I can be of assistance to you before our next scheduled telephone appointment.  Following are the goals we discussed today:  Take all medications as prescribed Attend all scheduled provider appointments Call provider office for new concerns or questions  check blood sugar at prescribed times: when you have symptoms of low or high blood sugar and EVERY OTHER DAY check feet daily for cuts, sores or redness take the blood sugar log to all doctor visits drink 6 to 8 glasses of water each day wash and dry feet carefully every day wear comfortable, well-fitting shoes limit outdoor activity during cold weather develop a rescue plan eliminate symptom triggers at home follow rescue plan if symptoms flare-up practice relaxation or meditation daily  Our next appointment is by telephone on 7/13 at 1000  Please call the care guide team at [phone] if you need to cancel or reschedule your appointment.   If you are experiencing a Mental Health or [place] or need someone to talk to, please call the Suicide and Crisis Lifeline: 988 call the [place] National Suicide Prevention Lifeline: 959-847-3590 or TTY: 979-073-0563 TTY 856-053-7168) to talk to a trained counselor call 1-800-273-TALK (toll free, 24 hour hotline) call 911   Following is a copy of your full plan of care:  Care Plan : Richard Navarro (Adult)  Updates made by Leona Singleton, RN since 01/19/2022 12:00 AM     Problem: KNOWLEDGE DEFICIT AND LUNG CANCER DIAGNOSIS MAKING IT DIFFICULT TO SELF MANAGE CHRONIC MEDICAL CONDITION   Priority: Medium     Long-Range Goal: PATIENT WILL WORK WITH CCM TEAM TO GAIN KNOWLEDGE TO BETTER SELF MANAGE CHRONIC MEDICAL CONDIT   Start Date: 01/19/2022  Expected End Date: 01/19/2023  Priority: Medium  Note:   Current Barriers:  Chronic Disease Management support  and 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  Monitors blood sugars once a week or every 2 weeks,  A1C is 6.6.  Denies any Sob, chest pains states wears oxygen only when SOB; has not had to use the rescue inhaler  RNCM Clinical Goal(s):  Patient will demonstrate Ongoing health management independence as evidenced by no emergency room visits and maintaining A1C <7  through collaboration with RN Care manager, provider, and care team.   Interventions: 1:1 collaboration with primary care provider regarding development and update of comprehensive plan of care as evidenced by provider attestation and co-signature Inter-disciplinary care team collaboration (see longitudinal plan of care) Evaluation of current treatment plan related to  self management and patient's adherence to plan as established by provider   COPD Interventions:  (Status:  New goal.) Long Term Goal Provided patient with basic written and verbal COPD education on self care/management/and exacerbation prevention Advised patient to track and manage COPD triggers Provided instruction about proper use of medications used for management of COPD including inhalers Advised patient to self assesses COPD action plan zone and make appointment with provider if in the yellow zone for 48 hours without improvement Discussed the importance of adequate rest and management of fatigue with COPD   Diabetes Interventions:  (Status:  New goal.) Long Term Goal Assessed patient's understanding of A1c goal: <7% Provided education to patient about basic DM disease process Reviewed medications with patient and discussed importance of medication adherence Counseled  on importance of regular laboratory monitoring as prescribed Discussed plans with patient for ongoing care management follow up and provided patient with direct contact information for care management team Advised  patient, providing education and rationale, to check cbg every other day and record, calling PCP for findings outside established parameters Screening for signs and symptoms of depression related to chronic disease state  Assessed social determinant of health barriers Lab Results  Component Value Date   HGBA1C 6.6 (A) 01/09/2022   Patient Goals/Self-Care Activities: Take all medications as prescribed Attend all scheduled provider appointments Call provider office for new concerns or questions  check blood sugar at prescribed times: when you have symptoms of low or high blood sugar and EVERY OTHER DAY check feet daily for cuts, sores or redness take the blood sugar log to all doctor visits drink 6 to 8 glasses of water each day wash and dry feet carefully every day wear comfortable, well-fitting shoes limit outdoor activity during cold weather develop a rescue plan eliminate symptom triggers at home follow rescue plan if symptoms flare-up practice relaxation or meditation daily  Follow Up Plan:  The care management team will reach out to the patient again over the next 45 days.     Richard Navarro was given information about Care Management services by the embedded care coordination team including:  Care Management services include personalized support from designated clinical staff supervised by his physician, including individualized plan of care and coordination with other care providers 24/7 contact phone numbers for assistance for urgent and routine care needs. The patient may stop CCM services at any time (effective at the end of the month) by phone call to the office staff.  Patient agreed to services and verbal consent obtained.   Patient verbalizes understanding of instructions and care plan provided today and agrees to view in [place]. Active MyChart status and patient understanding of how to access instructions and care plan via MyChart confirmed with patient.     The care  management team will reach out to the patient again over the next 45 days.   Richard Azure RN, MSN RN Care Management Coordinator  [facility] [phone] [email or web address]

## 2022-01-20 ENCOUNTER — Ambulatory Visit (HOSPITAL_COMMUNITY)
Admission: RE | Admit: 2022-01-20 | Discharge: 2022-01-20 | Disposition: A | Discharge status: Home / Self Care | Payer: Medicare Other | Source: Ambulatory Visit | Attending: Internal Medicine | Admitting: Internal Medicine

## 2022-01-20 ENCOUNTER — Ambulatory Visit (HOSPITAL_COMMUNITY): Admission: RE | Admit: 2022-01-20 | Payer: Medicare Other | Source: Ambulatory Visit

## 2022-01-20 DIAGNOSIS — K4090 Unilateral inguinal hernia, without obstruction or gangrene, not specified as recurrent: Secondary | ICD-10-CM | POA: Diagnosis not present

## 2022-01-20 DIAGNOSIS — C7B8 Other secondary neuroendocrine tumors: Secondary | ICD-10-CM | POA: Diagnosis not present

## 2022-01-20 DIAGNOSIS — C7A8 Other malignant neuroendocrine tumors: Secondary | ICD-10-CM | POA: Insufficient documentation

## 2022-01-20 DIAGNOSIS — C787 Secondary malignant neoplasm of liver and intrahepatic bile duct: Secondary | ICD-10-CM | POA: Diagnosis not present

## 2022-01-20 DIAGNOSIS — N200 Calculus of kidney: Secondary | ICD-10-CM | POA: Diagnosis not present

## 2022-01-20 DIAGNOSIS — N281 Cyst of kidney, acquired: Secondary | ICD-10-CM | POA: Diagnosis not present

## 2022-01-20 DIAGNOSIS — J90 Pleural effusion, not elsewhere classified: Secondary | ICD-10-CM | POA: Diagnosis not present

## 2022-01-23 ENCOUNTER — Inpatient Hospital Stay: Payer: Medicare Other | Attending: Internal Medicine

## 2022-01-23 ENCOUNTER — Other Ambulatory Visit: Payer: Self-pay

## 2022-01-23 DIAGNOSIS — C7B02 Secondary carcinoid tumors of liver: Secondary | ICD-10-CM | POA: Diagnosis not present

## 2022-01-23 DIAGNOSIS — C7A090 Malignant carcinoid tumor of the bronchus and lung: Secondary | ICD-10-CM | POA: Insufficient documentation

## 2022-01-23 DIAGNOSIS — C7B8 Other secondary neuroendocrine tumors: Secondary | ICD-10-CM

## 2022-01-23 DIAGNOSIS — C7B03 Secondary carcinoid tumors of bone: Secondary | ICD-10-CM | POA: Insufficient documentation

## 2022-01-23 LAB — CBC WITH DIFFERENTIAL (CANCER CENTER ONLY)
Abs Immature Granulocytes: 0.01 10*3/uL (ref 0.00–0.07)
Basophils Absolute: 0 10*3/uL (ref 0.0–0.1)
Basophils Relative: 1 %
Eosinophils Absolute: 0.1 10*3/uL (ref 0.0–0.5)
Eosinophils Relative: 2 %
HCT: 36.1 % — ABNORMAL LOW (ref 39.0–52.0)
Hemoglobin: 12.6 g/dL — ABNORMAL LOW (ref 13.0–17.0)
Immature Granulocytes: 0 %
Lymphocytes Relative: 28 %
Lymphs Abs: 0.9 10*3/uL (ref 0.7–4.0)
MCH: 34.8 pg — ABNORMAL HIGH (ref 26.0–34.0)
MCHC: 34.9 g/dL (ref 30.0–36.0)
MCV: 99.7 fL (ref 80.0–100.0)
Monocytes Absolute: 0.4 10*3/uL (ref 0.1–1.0)
Monocytes Relative: 11 %
Neutro Abs: 1.8 10*3/uL (ref 1.7–7.7)
Neutrophils Relative %: 58 %
Platelet Count: 158 10*3/uL (ref 150–400)
RBC: 3.62 MIL/uL — ABNORMAL LOW (ref 4.22–5.81)
RDW: 16 % — ABNORMAL HIGH (ref 11.5–15.5)
WBC Count: 3.1 10*3/uL — ABNORMAL LOW (ref 4.0–10.5)
nRBC: 0 % (ref 0.0–0.2)

## 2022-01-23 LAB — CMP (CANCER CENTER ONLY)
ALT: 11 U/L (ref 0–44)
AST: 13 U/L — ABNORMAL LOW (ref 15–41)
Albumin: 4 g/dL (ref 3.5–5.0)
Alkaline Phosphatase: 75 U/L (ref 38–126)
Anion gap: 8 (ref 5–15)
BUN: 14 mg/dL (ref 8–23)
CO2: 27 mmol/L (ref 22–32)
Calcium: 9.4 mg/dL (ref 8.9–10.3)
Chloride: 104 mmol/L (ref 98–111)
Creatinine: 1.67 mg/dL — ABNORMAL HIGH (ref 0.61–1.24)
GFR, Estimated: 42 mL/min — ABNORMAL LOW (ref >60)
Glucose, Bld: 180 mg/dL — ABNORMAL HIGH (ref 70–99)
Potassium: 3.7 mmol/L (ref 3.5–5.1)
Sodium: 139 mmol/L (ref 135–145)
Total Bilirubin: 0.6 mg/dL (ref 0.3–1.2)
Total Protein: 7.5 g/dL (ref 6.5–8.1)

## 2022-01-25 ENCOUNTER — Other Ambulatory Visit: Payer: Self-pay

## 2022-01-25 ENCOUNTER — Inpatient Hospital Stay (HOSPITAL_BASED_OUTPATIENT_CLINIC_OR_DEPARTMENT_OTHER): Payer: Medicare Other | Admitting: Internal Medicine

## 2022-01-25 VITALS — BP 135/67 | HR 70 | Temp 97.0°F | Resp 16 | Wt 181.3 lb

## 2022-01-25 DIAGNOSIS — C7A090 Malignant carcinoid tumor of the bronchus and lung: Secondary | ICD-10-CM | POA: Diagnosis not present

## 2022-01-25 DIAGNOSIS — C3431 Malignant neoplasm of lower lobe, right bronchus or lung: Secondary | ICD-10-CM

## 2022-01-25 DIAGNOSIS — C7B03 Secondary carcinoid tumors of bone: Secondary | ICD-10-CM | POA: Diagnosis not present

## 2022-01-25 DIAGNOSIS — Z5111 Encounter for antineoplastic chemotherapy: Secondary | ICD-10-CM | POA: Diagnosis not present

## 2022-01-25 DIAGNOSIS — C7A8 Other malignant neuroendocrine tumors: Secondary | ICD-10-CM

## 2022-01-25 DIAGNOSIS — C7B02 Secondary carcinoid tumors of liver: Secondary | ICD-10-CM | POA: Diagnosis not present
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
## 2022-02-03 ENCOUNTER — Telehealth: Payer: Self-pay | Admitting: Internal Medicine

## 2022-02-03 NOTE — Telephone Encounter (Signed)
Called patient regarding upcoming July appointment, patient has been called and notified.

## 2022-02-09 ENCOUNTER — Other Ambulatory Visit (HOSPITAL_COMMUNITY): Payer: Self-pay

## 2022-02-15 ENCOUNTER — Other Ambulatory Visit (HOSPITAL_COMMUNITY): Payer: Self-pay

## 2022-02-22 ENCOUNTER — Other Ambulatory Visit: Payer: Self-pay

## 2022-02-22 ENCOUNTER — Inpatient Hospital Stay (HOSPITAL_BASED_OUTPATIENT_CLINIC_OR_DEPARTMENT_OTHER): Payer: Medicare Other | Admitting: Internal Medicine

## 2022-02-22 ENCOUNTER — Inpatient Hospital Stay: Payer: Medicare Other | Attending: Internal Medicine

## 2022-02-22 VITALS — BP 151/80 | HR 69 | Temp 98.3°F | Resp 16 | Wt 184.8 lb

## 2022-02-22 DIAGNOSIS — C7A8 Other malignant neuroendocrine tumors: Secondary | ICD-10-CM | POA: Diagnosis not present

## 2022-02-22 DIAGNOSIS — C7B8 Other secondary neuroendocrine tumors: Secondary | ICD-10-CM

## 2022-02-22 DIAGNOSIS — J439 Emphysema, unspecified: Secondary | ICD-10-CM | POA: Insufficient documentation

## 2022-02-22 DIAGNOSIS — R140 Abdominal distension (gaseous): Secondary | ICD-10-CM | POA: Diagnosis not present

## 2022-02-22 DIAGNOSIS — C7A090 Malignant carcinoid tumor of the bronchus and lung: Secondary | ICD-10-CM | POA: Diagnosis not present

## 2022-02-22 DIAGNOSIS — Z79899 Other long term (current) drug therapy: Secondary | ICD-10-CM | POA: Diagnosis not present

## 2022-02-22 DIAGNOSIS — C7B02 Secondary carcinoid tumors of liver: Secondary | ICD-10-CM | POA: Diagnosis not present

## 2022-02-22 DIAGNOSIS — Z7984 Long term (current) use of oral hypoglycemic drugs: Secondary | ICD-10-CM | POA: Diagnosis not present

## 2022-02-22 DIAGNOSIS — C7B03 Secondary carcinoid tumors of bone: Secondary | ICD-10-CM | POA: Insufficient documentation

## 2022-02-22 LAB — CBC WITH DIFFERENTIAL (CANCER CENTER ONLY)
Abs Immature Granulocytes: 0 10*3/uL (ref 0.00–0.07)
Basophils Absolute: 0 10*3/uL (ref 0.0–0.1)
Basophils Relative: 1 %
Eosinophils Absolute: 0 10*3/uL (ref 0.0–0.5)
Eosinophils Relative: 1 %
HCT: 37.7 % — ABNORMAL LOW (ref 39.0–52.0)
Hemoglobin: 13 g/dL (ref 13.0–17.0)
Immature Granulocytes: 0 %
Lymphocytes Relative: 19 %
Lymphs Abs: 0.6 10*3/uL — ABNORMAL LOW (ref 0.7–4.0)
MCH: 34.8 pg — ABNORMAL HIGH (ref 26.0–34.0)
MCHC: 34.5 g/dL (ref 30.0–36.0)
MCV: 100.8 fL — ABNORMAL HIGH (ref 80.0–100.0)
Monocytes Absolute: 0.4 10*3/uL (ref 0.1–1.0)
Monocytes Relative: 12 %
Neutro Abs: 2.3 10*3/uL (ref 1.7–7.7)
Neutrophils Relative %: 67 %
Platelet Count: 138 10*3/uL — ABNORMAL LOW (ref 150–400)
RBC: 3.74 MIL/uL — ABNORMAL LOW (ref 4.22–5.81)
RDW: 15.9 % — ABNORMAL HIGH (ref 11.5–15.5)
WBC Count: 3.4 10*3/uL — ABNORMAL LOW (ref 4.0–10.5)
nRBC: 0 % (ref 0.0–0.2)

## 2022-02-22 LAB — CMP (CANCER CENTER ONLY)
ALT: 12 U/L (ref 0–44)
AST: 16 U/L (ref 15–41)
Albumin: 4 g/dL (ref 3.5–5.0)
Alkaline Phosphatase: 68 U/L (ref 38–126)
Anion gap: 6 (ref 5–15)
BUN: 19 mg/dL (ref 8–23)
CO2: 25 mmol/L (ref 22–32)
Calcium: 9.3 mg/dL (ref 8.9–10.3)
Chloride: 106 mmol/L (ref 98–111)
Creatinine: 1.77 mg/dL — ABNORMAL HIGH (ref 0.61–1.24)
GFR, Estimated: 39 mL/min — ABNORMAL LOW (ref >60)
Glucose, Bld: 148 mg/dL — ABNORMAL HIGH (ref 70–99)
Potassium: 4.2 mmol/L (ref 3.5–5.1)
Sodium: 137 mmol/L (ref 135–145)
Total Bilirubin: 0.8 mg/dL (ref 0.3–1.2)
Total Protein: 8.1 g/dL (ref 6.5–8.1)
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
## 2022-03-01 ENCOUNTER — Ambulatory Visit (INDEPENDENT_AMBULATORY_CARE_PROVIDER_SITE_OTHER): Payer: Medicare Other | Admitting: Podiatry

## 2022-03-01 DIAGNOSIS — E1151 Type 2 diabetes mellitus with diabetic peripheral angiopathy without gangrene: Secondary | ICD-10-CM

## 2022-03-01 DIAGNOSIS — M79674 Pain in right toe(s): Secondary | ICD-10-CM | POA: Diagnosis not present

## 2022-03-01 DIAGNOSIS — M79675 Pain in left toe(s): Secondary | ICD-10-CM

## 2022-03-01 DIAGNOSIS — B351 Tinea unguium: Secondary | ICD-10-CM

## 2022-03-01 NOTE — Progress Notes (Signed)
  Subjective:  Patient ID: Richard Navarro, male    DOB: [birth year],  MRN: [id number]  Richard Navarro presents to clinic today for at risk foot care. Pt has h/o NIDDM with PAD and painful thick toenails that are difficult to trim. Pain interferes with ambulation. Aggravating factors include wearing enclosed shoe gear. Pain is relieved with periodic professional debridement.  Last A1c was 6.6%.  Patient did not check blood glucose today.  New problem(s): None.   PCP is [name], MD , and last visit was  Jan 09, 2022  Allergies  Allergen Reactions   Lisinopril Swelling    Angioedema- on this and afinitor same time   Simvastatin Other (See Comments)    Joint ache    Review of Systems: Negative except as noted in the HPI.  Objective: No changes noted in today's physical examination. Constitutional Richard Navarro is a pleasant 76 y.o. African American male, thin build in NAD. AAO x 3.   Vascular CFT <3 seconds b/l LE. Faintly palpable DP pulses b/l LE. Nonpalpable PT pulse(s) b/l LE. Pedal hair sparse. No pain with calf compression b/l. No edema noted b/l LE. No ischemia or gangrene noted b/l LE. No cyanosis or clubbing noted b/l LE.  Neurologic Normal speech. Oriented to person, place, and time. Protective sensation intact 5/5 intact bilaterally with 10g monofilament b/l.  Dermatologic Pedal skin thin and atrophic b/l LE. No open wounds b/l LE. No interdigital macerations noted b/l LE. Toenails 1-5 b/l elongated, discolored, dystrophic, thickened, crumbly with subungual debris and tenderness to dorsal palpation.  Orthopedic: Muscle strength 5/5 to all lower extremity muscle groups bilaterally. No pain, crepitus or joint limitation noted with ROM bilateral LE. Clawtoe deformity 2-5 bilaterally. Patient ambulates independent of any assistive aids.   Radiographs: None    Latest Ref Rng & Units 01/09/2022    2:52 PM 09/09/2021    2:34 PM 04/17/2021    8:17 AM  Hemoglobin A1C   Hemoglobin-A1c 4.0 - 5.6 % 6.6  6.6  7.0    Assessment/Plan: 1. Pain due to onychomycosis of toenails of both feet   2. Type II diabetes mellitus with peripheral circulatory disorder (HCC)      -Examined patient. -No new findings. No new orders. -Continue foot and shoe inspections daily. Monitor blood glucose per PCP/Endocrinologist's recommendations. -Patient to continue soft, supportive shoe gear daily. -Mycotic toenails 1-5 bilaterally were debrided in length and girth with sterile nail nippers and dremel without incident. -Patient/POA to call should there be question/concern in the interim.   Return in about 3 months (around 06/01/2022).  Marzetta Board, DPM

## 2022-03-02 ENCOUNTER — Ambulatory Visit: Payer: Medicare Other | Admitting: *Deleted

## 2022-03-02 DIAGNOSIS — J9611 Chronic respiratory failure with hypoxia: Secondary | ICD-10-CM

## 2022-03-02 DIAGNOSIS — E1122 Type 2 diabetes mellitus with diabetic chronic kidney disease: Secondary | ICD-10-CM

## 2022-03-02 NOTE — Chronic Care Management (AMB) (Signed)
  Care Management   Outreach Note  03/02/2022 Name: BRITAIN SABER MRN: [id number] DOB: 1946-06-23  Referred by: [name], MD Reason for referral : Case Closure   Successful outreach to patient.  States he is doing well without complaints.  Discussed goals and both agree patient has met goals of the program.  Follow Up Plan:  Successful outreach to patient.  States she is doing well without complaints.  Discussed goals and both agree patient has met goals of the program.  Hubert Azure RN, MSN RN Care Management Coordinator  [facility] [phone] [email or web address]

## 2022-03-02 NOTE — Patient Instructions (Signed)
CONGRATULATIONS ON COMPLETING YOUR GOALS.  IT AS BEEN A PLEASURE WORKING WITH AND TALKING TO YOU.  IF  NEEDS [facility] IN THE FUTURE PLEASE DO NOT HESITATE TO CONTACT ME  [phone]   Hager Compston RN, MSN RN Care Management Coordinator  [facility] [phone] [email or web address]  

## 2022-03-06 ENCOUNTER — Encounter: Payer: Self-pay | Admitting: Podiatry

## 2022-03-07 ENCOUNTER — Other Ambulatory Visit: Payer: Self-pay | Admitting: Internal Medicine

## 2022-03-07 ENCOUNTER — Other Ambulatory Visit (HOSPITAL_COMMUNITY): Payer: Self-pay

## 2022-03-07 DIAGNOSIS — C7A8 Other malignant neuroendocrine tumors: Secondary | ICD-10-CM

## 2022-03-07 MED ORDER — CAPECITABINE 500 MG PO TABS
ORAL_TABLET | ORAL | 2 refills | Status: DC
  Filled 2022-03-07: qty 84, 28d supply, fill #0
  Filled 2022-04-04: qty 84, 28d supply, fill #1
  Filled 2022-05-12: qty 84, 28d supply, fill #2

## 2022-03-09 ENCOUNTER — Other Ambulatory Visit (HOSPITAL_COMMUNITY): Payer: Self-pay

## 2022-03-15 ENCOUNTER — Other Ambulatory Visit (HOSPITAL_COMMUNITY): Payer: Self-pay

## 2022-03-19 ENCOUNTER — Telehealth: Payer: Self-pay | Admitting: Pulmonary Disease

## 2022-03-19 NOTE — Telephone Encounter (Signed)
Richard Navarro called to report that O2 sats were low today AM with level of 88%, Temp 99.6. The issue improved spontaneously later in the morning.  No cough, mucus production, fevers or chills.  Asked her to observe him and call back if the issue reccurs again  Marshell Garfinkel MD [facility] 03/19/2022, 3:52 PM

## 2022-03-31 ENCOUNTER — Other Ambulatory Visit (HOSPITAL_COMMUNITY): Payer: Self-pay

## 2022-04-04 ENCOUNTER — Other Ambulatory Visit (HOSPITAL_COMMUNITY): Payer: Self-pay

## 2022-04-11 ENCOUNTER — Other Ambulatory Visit (HOSPITAL_COMMUNITY): Payer: Self-pay

## 2022-04-25 ENCOUNTER — Ambulatory Visit (HOSPITAL_COMMUNITY): Admission: RE | Admit: 2022-04-25 | Payer: Medicare Other | Source: Ambulatory Visit

## 2022-04-25 ENCOUNTER — Emergency Department (HOSPITAL_COMMUNITY): Payer: Medicare Other

## 2022-04-25 ENCOUNTER — Inpatient Hospital Stay: Payer: Medicare Other | Attending: Internal Medicine

## 2022-04-25 ENCOUNTER — Other Ambulatory Visit: Payer: Self-pay

## 2022-04-25 ENCOUNTER — Emergency Department (HOSPITAL_COMMUNITY)
Admission: EM | Admit: 2022-04-25 | Discharge: 2022-04-25 | Disposition: A | Discharge status: Home / Self Care | Payer: Medicare Other | Attending: Emergency Medicine | Admitting: Emergency Medicine

## 2022-04-25 ENCOUNTER — Encounter (HOSPITAL_COMMUNITY): Payer: Self-pay

## 2022-04-25 DIAGNOSIS — J90 Pleural effusion, not elsewhere classified: Secondary | ICD-10-CM | POA: Diagnosis not present

## 2022-04-25 DIAGNOSIS — R404 Transient alteration of awareness: Secondary | ICD-10-CM | POA: Diagnosis not present

## 2022-04-25 DIAGNOSIS — N281 Cyst of kidney, acquired: Secondary | ICD-10-CM | POA: Diagnosis not present

## 2022-04-25 DIAGNOSIS — R10819 Abdominal tenderness, unspecified site: Secondary | ICD-10-CM | POA: Insufficient documentation

## 2022-04-25 DIAGNOSIS — R509 Fever, unspecified: Secondary | ICD-10-CM | POA: Insufficient documentation

## 2022-04-25 DIAGNOSIS — R000 Tachycardia, unspecified: Secondary | ICD-10-CM | POA: Diagnosis not present

## 2022-04-25 DIAGNOSIS — R4182 Altered mental status, unspecified: Secondary | ICD-10-CM | POA: Diagnosis present

## 2022-04-25 DIAGNOSIS — J439 Emphysema, unspecified: Secondary | ICD-10-CM | POA: Diagnosis not present

## 2022-04-25 DIAGNOSIS — Z20822 Contact with and (suspected) exposure to covid-19: Secondary | ICD-10-CM | POA: Insufficient documentation

## 2022-04-25 DIAGNOSIS — N200 Calculus of kidney: Secondary | ICD-10-CM | POA: Diagnosis not present

## 2022-04-25 DIAGNOSIS — R410 Disorientation, unspecified: Secondary | ICD-10-CM | POA: Diagnosis not present

## 2022-04-25 DIAGNOSIS — R918 Other nonspecific abnormal finding of lung field: Secondary | ICD-10-CM | POA: Diagnosis not present

## 2022-04-25 DIAGNOSIS — C787 Secondary malignant neoplasm of liver and intrahepatic bile duct: Secondary | ICD-10-CM | POA: Diagnosis not present

## 2022-04-25 DIAGNOSIS — C7A8 Other malignant neuroendocrine tumors: Secondary | ICD-10-CM

## 2022-04-25 LAB — CBC WITH DIFFERENTIAL/PLATELET
Abs Immature Granulocytes: 0.02 10*3/uL (ref 0.00–0.07)
Basophils Absolute: 0 10*3/uL (ref 0.0–0.1)
Basophils Relative: 0 %
Eosinophils Absolute: 0 10*3/uL (ref 0.0–0.5)
Eosinophils Relative: 0 %
HCT: 36.1 % — ABNORMAL LOW (ref 39.0–52.0)
Hemoglobin: 12.4 g/dL — ABNORMAL LOW (ref 13.0–17.0)
Immature Granulocytes: 1 %
Lymphocytes Relative: 10 %
Lymphs Abs: 0.4 10*3/uL — ABNORMAL LOW (ref 0.7–4.0)
MCH: 35 pg — ABNORMAL HIGH (ref 26.0–34.0)
MCHC: 34.3 g/dL (ref 30.0–36.0)
MCV: 102 fL — ABNORMAL HIGH (ref 80.0–100.0)
Monocytes Absolute: 0.4 10*3/uL (ref 0.1–1.0)
Monocytes Relative: 10 %
Neutro Abs: 3.4 10*3/uL (ref 1.7–7.7)
Neutrophils Relative %: 79 %
Platelets: 105 10*3/uL — ABNORMAL LOW (ref 150–400)
RBC: 3.54 MIL/uL — ABNORMAL LOW (ref 4.22–5.81)
RDW: 15.4 % (ref 11.5–15.5)
WBC: 4.2 10*3/uL (ref 4.0–10.5)
nRBC: 0 % (ref 0.0–0.2)

## 2022-04-25 LAB — COMPREHENSIVE METABOLIC PANEL
ALT: 10 U/L (ref 0–44)
AST: 16 U/L (ref 15–41)
Albumin: 3.9 g/dL (ref 3.5–5.0)
Alkaline Phosphatase: 68 U/L (ref 38–126)
Anion gap: 8 (ref 5–15)
BUN: 16 mg/dL (ref 8–23)
CO2: 25 mmol/L (ref 22–32)
Calcium: 9.3 mg/dL (ref 8.9–10.3)
Chloride: 105 mmol/L (ref 98–111)
Creatinine, Ser: 1.68 mg/dL — ABNORMAL HIGH (ref 0.61–1.24)
GFR, Estimated: 41 mL/min — ABNORMAL LOW (ref >60)
Glucose, Bld: 160 mg/dL — ABNORMAL HIGH (ref 70–99)
Potassium: 4 mmol/L (ref 3.5–5.1)
Sodium: 138 mmol/L (ref 135–145)
Total Bilirubin: 0.8 mg/dL (ref 0.3–1.2)
Total Protein: 8.5 g/dL — ABNORMAL HIGH (ref 6.5–8.1)

## 2022-04-25 LAB — CMP (CANCER CENTER ONLY)
ALT: 7 U/L (ref 0–44)
AST: 13 U/L — ABNORMAL LOW (ref 15–41)
Albumin: 4.1 g/dL (ref 3.5–5.0)
Alkaline Phosphatase: 76 U/L (ref 38–126)
Anion gap: 8 (ref 5–15)
BUN: 15 mg/dL (ref 8–23)
CO2: 26 mmol/L (ref 22–32)
Calcium: 9.5 mg/dL (ref 8.9–10.3)
Chloride: 101 mmol/L (ref 98–111)
Creatinine: 1.59 mg/dL — ABNORMAL HIGH (ref 0.61–1.24)
GFR, Estimated: 44 mL/min — ABNORMAL LOW (ref >60)
Glucose, Bld: 207 mg/dL — ABNORMAL HIGH (ref 70–99)
Potassium: 3.7 mmol/L (ref 3.5–5.1)
Sodium: 135 mmol/L (ref 135–145)
Total Bilirubin: 0.7 mg/dL (ref 0.3–1.2)
Total Protein: 8.3 g/dL — ABNORMAL HIGH (ref 6.5–8.1)

## 2022-04-25 LAB — CBC WITH DIFFERENTIAL (CANCER CENTER ONLY)
Abs Immature Granulocytes: 0.01 10*3/uL (ref 0.00–0.07)
Basophils Absolute: 0 10*3/uL (ref 0.0–0.1)
Basophils Relative: 0 %
Eosinophils Absolute: 0 10*3/uL (ref 0.0–0.5)
Eosinophils Relative: 0 %
HCT: 34.9 % — ABNORMAL LOW (ref 39.0–52.0)
Hemoglobin: 12.1 g/dL — ABNORMAL LOW (ref 13.0–17.0)
Immature Granulocytes: 0 %
Lymphocytes Relative: 7 %
Lymphs Abs: 0.3 10*3/uL — ABNORMAL LOW (ref 0.7–4.0)
MCH: 34 pg (ref 26.0–34.0)
MCHC: 34.7 g/dL (ref 30.0–36.0)
MCV: 98 fL (ref 80.0–100.0)
Monocytes Absolute: 0.4 10*3/uL (ref 0.1–1.0)
Monocytes Relative: 10 %
Neutro Abs: 3.5 10*3/uL (ref 1.7–7.7)
Neutrophils Relative %: 83 %
Platelet Count: 111 10*3/uL — ABNORMAL LOW (ref 150–400)
RBC: 3.56 MIL/uL — ABNORMAL LOW (ref 4.22–5.81)
RDW: 15.1 % (ref 11.5–15.5)
WBC Count: 4.3 10*3/uL (ref 4.0–10.5)
nRBC: 0 % (ref 0.0–0.2)

## 2022-04-25 LAB — PROTIME-INR
INR: 1 (ref 0.8–1.2)
Prothrombin Time: 13.4 seconds (ref 11.4–15.2)

## 2022-04-25 LAB — URINALYSIS, ROUTINE W REFLEX MICROSCOPIC
Bilirubin Urine: NEGATIVE
Glucose, UA: NEGATIVE mg/dL
Hgb urine dipstick: NEGATIVE
Ketones, ur: NEGATIVE mg/dL
Leukocytes,Ua: NEGATIVE
Nitrite: NEGATIVE
Protein, ur: NEGATIVE mg/dL
Specific Gravity, Urine: 1.012 (ref 1.005–1.030)
pH: 6 (ref 5.0–8.0)

## 2022-04-25 LAB — LACTIC ACID, PLASMA
Lactic Acid, Venous: 1.5 mmol/L (ref 0.5–1.9)
Lactic Acid, Venous: 1 mmol/L (ref 0.5–1.9)

## 2022-04-25 LAB — APTT: aPTT: 32 seconds (ref 24–36)

## 2022-04-25 LAB — RESP PANEL BY RT-PCR (FLU A&B, COVID) ARPGX2
Influenza A by PCR: NEGATIVE
Influenza B by PCR: NEGATIVE
SARS Coronavirus 2 by RT PCR: NEGATIVE

## 2022-04-25 LAB — CBG MONITORING, ED: Glucose-Capillary: 244 mg/dL — ABNORMAL HIGH (ref 70–99)

## 2022-04-25 MED ORDER — LACTATED RINGERS IV SOLN: INTRAVENOUS | Status: DC

## 2022-04-25 MED ORDER — SODIUM CHLORIDE 0.9 % IV BOLUS (SEPSIS)
1000.0000 mL | Freq: Once | INTRAVENOUS | Status: AC
  Administered 2022-04-25: 1000 mL via INTRAVENOUS

## 2022-04-25 MED ORDER — SODIUM CHLORIDE 0.9 % IV SOLN
2.0000 g | INTRAVENOUS | Status: DC
  Administered 2022-04-25: 2 g via INTRAVENOUS
  Filled 2022-04-25: qty 20

## 2022-04-25 NOTE — Progress Notes (Deleted)
04/25/2022 Richard Navarro [id number] 08-01-1946   Chief Complaint:  History of Present Illness: Richard Navarro is a 76 year old male with a past medical history of stage hypertension, DM II,  CKD, IV low grade neuroendocrine carcinoid tumor with a large right lower lobe lung mass treated with chemo and radiation (Xeloda 750 mg/M2 twice daily for 14 days in addition to Temodar 200 mg/M2 on days 10-14 every 4 weeks.  Status post 19 months of treatment) with liver metastasis s/p particle embolization per IR 01/09/2018, PTSD, remote alcohol use disorder, GERD and colon polyps.   He presents today for further evaluation regarding acid reflux symptoms. He was initially seen by Dr. Loletha Carrow 10/12/20233 due to having dysphagia and to schedule a colonoscopy.  Home oxygen   He was seen in the ED 9/5  Transient episode of alteration of awareness earlier today while attempting to receive a CAT scan.  Pending sepsis work-up at time of signout.  Work-up reassuringly negative for sepsis and imaging with no acute findings.  Patient has returned back to normal mental status baseline and he currently does not meet inpatient criteria for admission.  There is no evidence of active delirium at this time.  Patient then discharged     Latest Ref Rng & Units 04/25/2022    3:28 PM 04/25/2022   11:21 AM 02/22/2022    3:12 PM  CBC  WBC 4.0 - 10.5 K/uL 4.2  4.3  3.4   Hemoglobin 13.0 - 17.0 g/dL 12.4  12.1  13.0   Hematocrit 39.0 - 52.0 % 36.1  34.9  37.7   Platelets 150 - 400 K/uL 105  111  138        Latest Ref Rng & Units 04/25/2022    3:28 PM 04/25/2022   11:21 AM 02/22/2022    3:12 PM  CMP  Glucose 70 - 99 mg/dL 160  207  148   BUN 8 - 23 mg/dL 16  15  19    Creatinine 0.61 - 1.24 mg/dL 1.68  1.59  1.77   Sodium 135 - 145 mmol/L 138  135  137   Potassium 3.5 - 5.1 mmol/L 4.0  3.7  4.2   Chloride 98 - 111 mmol/L 105  101  106   CO2 22 - 32 mmol/L 25  26  25    Calcium 8.9 - 10.3 mg/dL 9.3  9.5  9.3    Total Protein 6.5 - 8.1 g/dL 8.5  8.3  8.1   Total Bilirubin 0.3 - 1.2 mg/dL 0.8  0.7  0.8   Alkaline Phos 38 - 126 U/L 68  76  68   AST 15 - 41 U/L 16  13  16    ALT 0 - 44 U/L 10  7  12      Chest/abdominal/pelvic CT 9/52023: CT CHEST FINDINGS   Cardiovascular: No significant vascular findings. Normal heart size. No pericardial effusion.   Mediastinum/Nodes: No axillary or supraclavicular adenopathy. No mediastinal or hilar adenopathy. No pericardial fluid. Esophagus normal.   Lungs/Pleura: RIGHT infrahilar mass measures 5.4 by 4.8 cm (image 45/series 2 compared to 4.5 by 4.1 cm. Visually lesion looks very similar. Small RIGHT effusion noted.   Extensive centrilobular emphysema the upper lobes. Posterior diaphragmatic hernia on the LEFT unchanged.   Musculoskeletal: Sclerotic lesion in the midthoracic spine measures 13 mm unchanged from prior (image 77/4)   CT ABDOMEN AND PELVIS FINDINGS   Hepatobiliary: No focal hepatic lesion on noncontrast exam. Gallbladder  normal.   Pancreas: Pancreas is normal. No ductal dilatation. No pancreatic inflammation.   Spleen: Spleen normal   Adrenals/urinary tract: Nonobstructing calculi of the RIGHT kidney. No ureterolithiasis or obstructive uropathy. Bladder normal.   Simple fluid attenuation cyst of the RIGHT kidney measures 3.5 cm. No follow-up recommended.   Stomach/Bowel: Small hiatal hernia. Stomach, small bowel, appendix, and cecum are normal. The colon and rectosigmoid colon are normal.   Vascular/Lymphatic: Abdominal aorta is normal caliber with atherosclerotic calcification. There is no retroperitoneal or periportal lymphadenopathy. No pelvic lymphadenopathy.   Reproductive: Prostate unremarkable   Other: No mesenteric mass.   Musculoskeletal: Sclerotic lesion in the LEFT iliac bone unchanged 16 mm.   IMPRESSION: Chest Impression:   1. RIGHT infrahilar mass measures slightly larger. Minimal interval change. 2.  Small RIGHT effusion is similar prior. 3. Centrilobular emphysema the upper lobes. 4. No new pulmonary nodules.   Abdomen / Pelvis Impression:   1. No evidence of liver metastasis on noncontrast exam. 2. No mesenteric mass. 3. Nonobstructing RIGHT renal calculi. 4. Stable sclerotic lesion in the midthoracic spine and LEFT iliac bone.     EGD 07/26/2021: - Benign-appearing esophageal stenosis. Dilated. minimal disruption after 20 mm balloon - 7 cm hiatal hernia. - The examination was otherwise normal. - Gastroesophageal flap valve classified - The examination was otherwise normal. - Gastroesophageal flap valve classified as Hill Grade IV (no fold, wide open lumen, hiatal hernia present). - No specimens collected.  Colonoscopy 07/26/2021: One 1 mm polyp in the transverse colon, removed with a cold biopsy forceps. Resected and retrieved. - The examination was otherwise normal on direct and retroflexion views. - No further colonoscopies due to age  A. COLON, TRANSVERSE, POLYPECTOMY:  Tubular adenoma.  Negative for high-grade dysplasia.     Current Medications, Allergies, Past Medical History, Past Surgical History, Family History and Social History were reviewed in [employer] record.   Review of Systems:   Constitutional: Negative for fever, sweats, chills or weight loss.  Respiratory: Negative for shortness of breath.   Cardiovascular: Negative for chest pain, palpitations and leg swelling.  Gastrointestinal: See HPI.  Musculoskeletal: Negative for back pain or muscle aches.  Neurological: Negative for dizziness, headaches or paresthesias.    Physical Exam: There were no vitals taken for this visit. General: Well developed, w   ***male in no acute distress. Head: Normocephalic and atraumatic. Eyes: No scleral icterus. Conjunctiva pink . Ears: Normal auditory acuity. Mouth: Dentition intact. No ulcers or lesions.  Lungs: Clear throughout to  auscultation. Heart: Regular rate and rhythm, no murmur. Abdomen: Soft, nontender and nondistended. No masses or hepatomegaly. Normal bowel sounds x 4 quadrants.  Rectal: *** Musculoskeletal: Symmetrical with no gross deformities. Extremities: No edema. Neurological: Alert oriented x 4. No focal deficits.  Psychological: Alert and cooperative. Normal mood and affect  Assessment and Recommendations:  GERD   Anemia   Thrombocytopenia   CKD

## 2022-04-25 NOTE — Sepsis Progress Note (Signed)
eLink is following this Code Sepsis. °

## 2022-04-25 NOTE — ED Provider Notes (Signed)
[facility] DEPT Provider Note   CSN: [id number] Arrival date & time: 04/25/22  1156     History {Add pertinent medical, surgical, social history, OB history to HPI:1} Chief Complaint  Patient presents with   Altered Mental Status    Richard Navarro is a 76 y.o. male.  HPI     76 year old male comes in with chief complaint of altered mental status. 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  Patient is followed up by oncology and gets oral chemotherapy.  Patient here with his ex-wife.  She indicates that patient woke up and was agitated and confused.  He was spaced out.  He they took him for routine blood work to the oncology team, and they advised that patient come to the emergency room.  Patient has had similar confusion in the past with sepsis.  Patient denies any new chest pain, shortness of breath, cough, UTI-like symptoms, nausea, vomiting. He has chronic abdominal pain.  Wife indicates that patient appears a lot better right now.    Home Medications Prior to Admission medications   Medication Sig Start Date End Date Taking? Authorizing Provider  albuterol (VENTOLIN HFA) 108 (90 Base) MCG/ACT inhaler Inhale 2 puffs into the lungs every 4 (four) hours as needed for wheezing or shortness of breath. 12/07/20   Collene Gobble, MD  allopurinol (ZYLOPRIM) 100 MG tablet Take 1 tablet (100 mg total) by mouth daily. 11/03/21   [name], MD  amLODipine (NORVASC) 10 MG tablet TAKE 1 TABLET DAILY 09/05/21   [name], MD  Calcium Carbonate Antacid (TUMS ULTRA 1000 PO) Take 2,000 mg by mouth in the morning and at bedtime.    [provider]  capecitabine (XELODA) 500 MG tablet TAKE 3 TABLETS BY MOUTH TWICE DAILY FOR 14 DAYS EVERY 4 WEEKS. 03/07/22 03/07/23  Curt Bears, MD  Cholecalciferol (VITAMIN D3) 25 MCG (1000 UT) CAPS Take 1,000 Units by mouth daily.     [provider]  Continuous Blood Gluc Sensor (FREESTYLE LIBRE 14 DAY SENSOR) MISC USE TO CHECK BLOOD SUGAR AS DIRECTED AND CHANGE EVERY 14 DAYS 09/30/19   [name], MD  docusate sodium (COLACE) 100 MG capsule Take 300 mg by mouth daily.    [provider]  Ferrous Sulfate (IRON) 325 (65 Fe) MG TABS Take 325 mg by mouth every other day.    [provider]  glipiZIDE (GLUCOTROL) 5 MG tablet TAKE 1 TABLET DAILY BEFORE BREAKFAST 12/02/21   [name], MD  glucose blood test strip Use to test blood sugar twice a day 03/22/20   [name], MD  guaiFENesin (MUCINEX) 600 MG 12 hr tablet Take 600 mg by mouth 2 (two) times daily.     [provider]  ketotifen (ZADITOR) 0.025 % ophthalmic solution Place 1 drop into both eyes daily as needed (allergies).    [provider]  magnesium hydroxide (MILK OF MAGNESIA) 400 MG/5ML suspension Take 30 mLs by mouth daily as needed (constipation).    [provider]  metoprolol tartrate (LOPRESSOR) 25 MG tablet TAKE 1 TABLET TWICE A DAY 11/21/21   [name], MD  ondansetron (ZOFRAN) 8 MG tablet TAKE 1 TABLET BY MOUTH EVERY 8 HOURS AS NEEDED FOR NAUSEA AND/OR VOMITING 08/29/21 08/29/22  Curt Bears, MD  OXYGEN Inhale 2 L into the lungs as needed (SOB).    [provider]  pantoprazole ([place])  40 MG tablet Take 1 tablet (40 mg total) by mouth 2 (two) times daily. 01/09/22   [name], MD  polyethylene glycol (MIRALAX / GLYCOLAX) 17 g packet Take 17 g by mouth daily.    [provider]  SPIRIVA RESPIMAT 2.5 MCG/ACT AERS USE 2 INHALATIONS DAILY 10/24/21   Collene Gobble, MD  temozolomide (TEMODAR) 100 MG capsule TAKE 4 CAPS BY MOUTH DAILY DAYS 10,11,12,13 AND 14 EVERY 4 WEEKS. TAKE ON AN EMPTY STOMACH OR AT BEDTIME TO DECREASE NAUSEA & VOMITING 01/12/22 01/12/23  Curt Bears, MD  Tiotropium Bromide-Olodaterol (STIOLTO RESPIMAT) 2.5-2.5 MCG/ACT AERS Inhale 2 puffs into  the lungs daily. 06/28/21   Collene Gobble, MD  vitamin B-12 (CYANOCOBALAMIN) 1000 MCG tablet Take 1,000 mcg by mouth daily.     [provider]  vitamin C (ASCORBIC ACID) 500 MG tablet Take 500 mg by mouth daily.    [provider]      Allergies    Lisinopril and Simvastatin    Review of Systems   Review of Systems  Physical Exam Updated Vital Signs BP (!) 174/87   Pulse (!) 166   Temp 100.1 F (37.8 C)   Resp 18   Ht 6\' 3"  (1.905 m)   Wt 63.5 kg   SpO2 97%   BMI 17.50 kg/m  Physical Exam Vitals and nursing note reviewed.  Constitutional:      Appearance: He is well-developed.  HENT:     Head: Atraumatic.  Cardiovascular:     Rate and Rhythm: Normal rate.  Pulmonary:     Effort: Pulmonary effort is normal.  Abdominal:     Tenderness: There is abdominal tenderness. There is no guarding or rebound.  Musculoskeletal:     Cervical back: Neck supple.  Skin:    General: Skin is warm.  Neurological:     Mental Status: He is alert and oriented to person, place, and time.     ED Results / Procedures / Treatments   Labs (all labs ordered are listed, but only abnormal results are displayed) Labs Reviewed  CBC WITH DIFFERENTIAL/PLATELET - Abnormal; Notable for the following components:      Result Value   RBC 3.54 (*)    Hemoglobin 12.4 (*)    HCT 36.1 (*)    MCV 102.0 (*)    MCH 35.0 (*)    Platelets 105 (*)    Lymphs Abs 0.4 (*)    All other components within normal limits  CBG MONITORING, ED - Abnormal; Notable for the following components:   Glucose-Capillary 244 (*)    All other components within normal limits  RESP PANEL BY RT-PCR (FLU A&B, COVID) ARPGX2  CULTURE, BLOOD (ROUTINE X 2)  CULTURE, BLOOD (ROUTINE X 2)  URINE CULTURE  LACTIC ACID, PLASMA  PROTIME-INR  APTT  LACTIC ACID, PLASMA  COMPREHENSIVE METABOLIC PANEL  URINALYSIS, ROUTINE W REFLEX MICROSCOPIC    EKG EKG Interpretation  Date/Time:  Tuesday April 25 2022  12:08:59 EDT Ventricular Rate:  118 PR Interval:  150 QRS Duration: 64 QT Interval:  322 QTC Calculation: 451 R Axis:   50 Text Interpretation: Sinus tachycardia Nonspecific ST and T wave abnormality Abnormal ECG When compared with ECG of 16-Apr-2021 21:34, No significant change since last tracing Confirmed by Varney Biles ([id number]) on 04/25/2022 3:54:52 PM  Radiology DG Chest Port 1 View  Result Date: 04/25/2022 CLINICAL DATA:  Questionable sepsis - evaluate for abnormality Malignancy history. EXAM: PORTABLE CHEST 1 VIEW COMPARISON:  Chest CT 01/20/2022, radiograph 04/16/2021 FINDINGS: Right lung volume loss with streaky opacities at the right lung base. This is likely due to postobstructive atelectasis with right infrahilar mass seen on prior CT. There is a probable small right pleural effusion, similar. The heart is normal in size. No evidence of acute airspace disease or pneumothorax. No pulmonary edema. On limited assessment, no acute osseous findings. IMPRESSION: Right lung volume loss with streaky opacities at the right lung base, likely due to postobstructive atelectasis with right infrahilar mass seen on prior CT. Probable small right pleural effusion. Findings are similar to prior exam allowing for differences in modality. Electronically Signed   By: Keith Rake M.D.   On: 04/25/2022 15:29    Procedures Procedures  {Document cardiac monitor, telemetry assessment procedure when appropriate:1}  Medications Ordered in ED Medications  lactated ringers infusion ( Intravenous New Bag/Given 04/25/22 1529)  cefTRIAXone (ROCEPHIN) 2 g in sodium chloride 0.9 % 100 mL IVPB (2 g Intravenous New Bag/Given 04/25/22 1532)  sodium chloride 0.9 % bolus 1,000 mL (1,000 mLs Intravenous New Bag/Given 04/25/22 1528)    ED Course/ Medical Decision Making/ A&P                           Medical Decision Making Amount and/or Complexity of Data Reviewed Labs: ordered. Radiology: ordered. ECG/medicine  tests: ordered.  Risk Prescription drug management.   This patient presents to the ED with chief complaint(s) of confusion with pertinent past medical history of stage IV neuroendocrine disorder on oral chemotherapy and previous history of sepsis.The complaint involves an extensive differential diagnosis and also carries with it a high risk of complications and morbidity.    Currently patient is AOx3.  Wife also indicates that patient is appearing a lot better.  Allegedly he had some blood work done earlier today and was supposed to get CT later today.  Patient is noted to have a low-grade fever.  The differential diagnosis includes early sepsis, cancer related fever.  The initial plan is to complete sepsis screen which includes urinalysis, chest x-ray, lactic acid and CBC, metabolic profile. We will also complete the CT scans he is supposed to get later today.   Additional history obtained: Additional history obtained from spouse Records reviewed  reviewed oncology records and admission from 2022 for sepsis.  It appears that the last admission for sepsis was because of unknown etiology.  The one prior to that was because of sepsis from pneumonia.  Independent labs interpretation:  The following labs were independently interpreted: CBC shows normal white count, metabolic profile is overall reassuring.  Independent visualization and interpretation of imaging: - I independently visualized the following imaging with scope of interpretation limited to determining acute life threatening conditions related to emergency care: X-ray of the chest, which revealed mild increased consolidation, he has history of cancer, could be secondary to it.  Treatment and Reassessment: Patient's CT scan chest abdomen pelvis without contrast are pending at this time.  Incoming team will follow-up.  If the CT scan is reassuring and the blood work is reassuring he can be discharged.  Final Clinical Impression(s)  / ED Diagnoses Final diagnoses:  None    Rx / DC Orders ED Discharge Orders     None

## 2022-04-25 NOTE — ED Triage Notes (Signed)
Pt presents with c/o altered mental status. Pt's family at bedside report that he has cancer and was going to have some scans done today but his MD reported that he needed to be seen. Pt's family report increased agitation and confusion. Family report hx of sepsis, pt is febrile on arrival.

## 2022-04-25 NOTE — ED Provider Notes (Signed)
  Physical Exam  BP (!) 171/84   Pulse 87   Temp 98.9 F (37.2 C) (Oral)   Resp 17   Ht 6\' 3"  (1.905 m)   Wt 63.5 kg   SpO2 100%   BMI 17.50 kg/m   Physical Exam Constitutional:      General: He is not in acute distress.    Appearance: Normal appearance.  HENT:     Head: Normocephalic and atraumatic.     Nose: No congestion or rhinorrhea.  Eyes:     General:        Right eye: No discharge.        Left eye: No discharge.     Extraocular Movements: Extraocular movements intact.     Pupils: Pupils are equal, round, and reactive to light.  Cardiovascular:     Rate and Rhythm: Normal rate and regular rhythm.     Heart sounds: No murmur heard. Pulmonary:     Effort: No respiratory distress.     Breath sounds: No wheezing or rales.  Abdominal:     General: There is no distension.     Tenderness: There is no abdominal tenderness.  Musculoskeletal:        General: Normal range of motion.     Cervical back: Normal range of motion.  Skin:    General: Skin is warm and dry.  Neurological:     General: No focal deficit present.     Mental Status: He is alert.     Procedures  Procedures  ED Course / MDM    Medical Decision Making Amount and/or Complexity of Data Reviewed Labs: ordered. Radiology: ordered. ECG/medicine tests: ordered.  Risk Prescription drug management.   Patient received in handoff.  Transient episode of alteration of awareness earlier today while attempting to receive a CAT scan.  Pending sepsis work-up at time of signout.  Work-up reassuringly negative for sepsis and imaging with no acute findings.  Patient has returned back to normal mental status baseline and he currently does not meet inpatient criteria for admission.  There is no evidence of active delirium at this time.  Patient then discharged.       Teressa Lower, MD 04/25/22 [phone]

## 2022-04-26 ENCOUNTER — Ambulatory Visit: Payer: Medicare Other | Admitting: Nurse Practitioner

## 2022-04-26 ENCOUNTER — Telehealth: Payer: Self-pay | Admitting: Nurse Practitioner

## 2022-04-26 NOTE — Telephone Encounter (Signed)
Good Morning [place],   [name] called for patient stating that he would not be able to make it to his appointment with you this morning at 11:00 due to him taking a trip to the ER yesterday and just being not physically able to come.  Patient was rescheduled for 10/6 at 2:30

## 2022-04-27 ENCOUNTER — Inpatient Hospital Stay (HOSPITAL_BASED_OUTPATIENT_CLINIC_OR_DEPARTMENT_OTHER): Payer: Medicare Other | Admitting: Internal Medicine

## 2022-04-27 DIAGNOSIS — C7A8 Other malignant neuroendocrine tumors: Secondary | ICD-10-CM | POA: Diagnosis not present

## 2022-04-27 DIAGNOSIS — C7B8 Other secondary neuroendocrine tumors: Secondary | ICD-10-CM

## 2022-04-27 LAB — URINE CULTURE
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
## 2022-04-30 LAB — CULTURE, BLOOD (ROUTINE X 2)
Culture: NO GROWTH
Culture: NO GROWTH

## 2022-05-03 ENCOUNTER — Other Ambulatory Visit (HOSPITAL_COMMUNITY): Payer: Self-pay

## 2022-05-12 ENCOUNTER — Other Ambulatory Visit: Payer: Self-pay | Admitting: Internal Medicine

## 2022-05-12 ENCOUNTER — Ambulatory Visit (INDEPENDENT_AMBULATORY_CARE_PROVIDER_SITE_OTHER): Payer: Medicare Other | Admitting: Family Medicine

## 2022-05-12 ENCOUNTER — Other Ambulatory Visit (HOSPITAL_COMMUNITY): Payer: Self-pay

## 2022-05-12 ENCOUNTER — Encounter: Payer: Self-pay | Admitting: Family Medicine

## 2022-05-12 VITALS — BP 110/60 | HR 68 | Temp 98.0°F | Ht 75.0 in | Wt 176.1 lb

## 2022-05-12 DIAGNOSIS — I10 Essential (primary) hypertension: Secondary | ICD-10-CM | POA: Diagnosis not present

## 2022-05-12 DIAGNOSIS — C7A8 Other malignant neuroendocrine tumors: Secondary | ICD-10-CM

## 2022-05-12 DIAGNOSIS — C3491 Malignant neoplasm of unspecified part of right bronchus or lung: Secondary | ICD-10-CM

## 2022-05-12 DIAGNOSIS — J9611 Chronic respiratory failure with hypoxia: Secondary | ICD-10-CM

## 2022-05-12 DIAGNOSIS — E1122 Type 2 diabetes mellitus with diabetic chronic kidney disease: Secondary | ICD-10-CM | POA: Diagnosis not present

## 2022-05-12 DIAGNOSIS — Z79899 Other long term (current) drug therapy: Secondary | ICD-10-CM | POA: Diagnosis not present

## 2022-05-12 DIAGNOSIS — Z23 Encounter for immunization: Secondary | ICD-10-CM

## 2022-05-12 DIAGNOSIS — N1832 Chronic kidney disease, stage 3b: Secondary | ICD-10-CM | POA: Diagnosis not present

## 2022-05-12 LAB — POCT GLYCOSYLATED HEMOGLOBIN (HGB A1C): Hemoglobin A1C: 6.4 % — AB (ref 4.0–5.6)

## 2022-05-12 MED ORDER — TEMOZOLOMIDE 100 MG PO CAPS
ORAL_CAPSULE | ORAL | 3 refills | Status: DC
  Filled 2022-05-12: qty 20, 28d supply, fill #0
  Filled 2022-06-26: qty 20, 28d supply, fill #1
  Filled 2022-07-18: qty 20, 28d supply, fill #2
  Filled 2022-08-15: qty 20, 28d supply, fill #3

## 2022-05-12 MED ORDER — GLUCOSE BLOOD VI STRP
ORAL_STRIP | 3 refills | Status: AC
Start: 2022-05-12 — End: ?

## 2022-05-12 MED ORDER — DEXCOM G7 RECEIVER DEVI
0 refills | Status: DC
Start: 2022-05-12 — End: 2024-07-17

## 2022-05-12 MED ORDER — DEXCOM G7 SENSOR MISC: 1.0000 | 3 refills | Status: DC

## 2022-05-12 NOTE — Patient Instructions (Addendum)
Get diabetic eye exam scheduled.  Let us know when/if you get your COVID, RSV and SHINGRIX vaccines at the pharmacy.  POC a1c  Please stop by lab before you go If you have mychart- we will send your results within 3 business days of [place] receiving them.  If you do not have mychart- we will call you about results within 5 business days of [place] receiving them.  *please also note that you will see labs on mychart as soon as they post. I will later go in and write notes on them- will say "notes from Dr. Yong Channel"   Recommended follow up: Return in about 4 months (around 09/11/2022) for followup or sooner if needed.Schedule b4 you leave.

## 2022-05-12 NOTE — Progress Notes (Signed)
Phone [phone] In person visit   Subjective:   Richard Navarro is a 76 y.o. year old very pleasant male patient who presents for/with See problem oriented charting Chief Complaint  Patient presents with   Follow-up   Hypertension   Diabetes   oxygen    Pt is needing portable o2 tank due to getting more out of breath outside the home when running errands (form is on the door)   Past Medical History-  Patient Active Problem List   Diagnosis Date Noted   Chronic hypoxemic respiratory failure ([place]) 08/04/2021    Priority: High   Anemia 05/07/2018    Priority: High   Chronic kidney disease (CKD) stage G3b/A1, moderately decreased glomerular filtration rate (GFR) between 30-44 mL/min/1.73 square meter and albuminuria creatinine ratio less than 30 mg/g ([place]) 03/04/2018    Priority: High   Neuroendocrine carcinoma ([place]) 08/09/2017    Priority: High   Primary cancer of right lung ([place]) 12/22/2016    Priority: High   Diabetes mellitus with renal manifestation (HCC)     Priority: High   Myalgia due to statin 03/04/2018    Priority: Medium    COPD (chronic obstructive pulmonary disease) ([place]) 12/01/2016    Priority: Medium    PTSD (post-traumatic stress disorder) 12/07/2015    Priority: Medium    Erectile dysfunction 04/09/2014    Priority: Medium    Former smoker 03/26/2014    Priority: Medium    Essential hypertension     Priority: Medium    Gout     Priority: Medium    Depression     Priority: Medium    Hyperlipidemia     Priority: Medium    Thrombocytopenia ([place])     Priority: Low   Pneumonia 08/09/2017    Priority: Low   Encounter for antineoplastic chemotherapy 01/10/2017    Priority: Low   Goals of care, counseling/discussion 01/10/2017    Priority: Low   Prostate cancer screening 04/17/2014    Priority: Low   Arthritis 03/26/2014    Priority: Low   History of adenomatous polyp of colon 03/26/2014    Priority: Low   GERD (gastroesophageal reflux  disease)     Priority: Low   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   Dysphagia    Encounter for colorectal cancer screening    Prolonged QT interval 04/17/2021   Sepsis ([place]) 04/16/2021   Alcohol abuse 10/26/2020   Benign neoplasm of colon 10/26/2020   Carpal tunnel syndrome 10/26/2020   Family history of malignant neoplasm of gastrointestinal tract 10/26/2020   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   Tobacco use disorder 10/26/2020   Type II diabetes mellitus with peripheral circulatory disorder ([place]) 09/21/2020   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 3 each 2   allopurinol (ZYLOPRIM) 100 MG tablet Take 1 tablet (100 mg total) by mouth daily. 90 tablet 3   amLODipine (NORVASC) 10 MG tablet TAKE 1 TABLET DAILY 90 tablet 3   Calcium Carbonate Antacid (TUMS ULTRA 1000 PO) Take 2,000 mg by mouth in the morning and at bedtime.     capecitabine (  XELODA) 500 MG tablet TAKE 3 TABLETS BY MOUTH TWICE DAILY FOR 14 DAYS EVERY 4 WEEKS. 84 tablet 2   Cholecalciferol (VITAMIN D3) 25 MCG (1000 UT) CAPS Take 1,000 Units by mouth daily.     Continuous Blood Gluc Receiver (DEXCOM G7 RECEIVER) DEVI Please provide 1 receiver 1 each 0   Continuous Blood Gluc Sensor (DEXCOM G7 SENSOR) MISC 1 Box by Does not apply route every 30 (thirty) days. 3 each 3   docusate sodium (COLACE) 100 MG capsule Take 300 mg by mouth daily.     Ferrous Sulfate (IRON) 325 (65 Fe) MG TABS Take 325 mg by mouth every other day.     glipiZIDE (GLUCOTROL) 5 MG tablet TAKE 1 TABLET DAILY BEFORE BREAKFAST 90 tablet 3    guaiFENesin (MUCINEX) 600 MG 12 hr tablet Take 600 mg by mouth 2 (two) times daily.      ketotifen (ZADITOR) 0.025 % ophthalmic solution Place 1 drop into both eyes daily as needed (allergies).     magnesium hydroxide (MILK OF MAGNESIA) 400 MG/5ML suspension Take 30 mLs by mouth daily as needed (constipation).     metoprolol tartrate (LOPRESSOR) 25 MG tablet TAKE 1 TABLET TWICE A DAY 180 tablet 3   ondansetron (ZOFRAN) 8 MG tablet TAKE 1 TABLET BY MOUTH EVERY 8 HOURS AS NEEDED FOR NAUSEA AND/OR VOMITING 30 tablet 0   OXYGEN Inhale 2 L into the lungs as needed (SOB).     pantoprazole (PROTONIX) 40 MG tablet Take 1 tablet (40 mg total) by mouth 2 (two) times daily. 180 tablet 3   polyethylene glycol (MIRALAX / GLYCOLAX) 17 g packet Take 17 g by mouth daily.     SPIRIVA RESPIMAT 2.5 MCG/ACT AERS USE 2 INHALATIONS DAILY 12 g 3   Tiotropium Bromide-Olodaterol (STIOLTO RESPIMAT) 2.5-2.5 MCG/ACT AERS Inhale 2 puffs into the lungs daily. 4 g 0   vitamin B-12 (CYANOCOBALAMIN) 1000 MCG tablet Take 1,000 mcg by mouth daily.      vitamin C (ASCORBIC ACID) 500 MG tablet Take 500 mg by mouth daily.     glucose blood test strip Use to test blood sugar twice a day 200 each 3   temozolomide (TEMODAR) 100 MG capsule TAKE 4 CAPS BY MOUTH DAILY DAYS 10,11,12,13 AND 14 EVERY 4 WEEKS. TAKE ON AN EMPTY STOMACH OR AT BEDTIME TO DECREASE NAUSEA & VOMITING 20 capsule 3   No current facility-administered medications for this visit.     Objective:  BP 110/60   Pulse 68   Temp 98 F (36.7 C)   Ht 6\' 3"  (1.905 m)   Wt 176 lb 2 oz (79.9 kg)   SpO2 97%   BMI 22.01 kg/m  Gen: NAD, resting comfortably CV: RRR no murmurs rubs or gallops Lungs: CTAB no crackles, wheeze, rhonchi Ext: no edema Skin: warm, dry  Oxygen qualification We walked patient down the hallway without oxygen and pulse ox got down to 84%-patient was placed on oxygen when he returned to the room and oxygen levels responded back up to 100%      Assessment and Plan   # Lung cancer- neuroendocrine carcinoma-treatment has been on hold for at least 3 months due to dysphagia and aspiration pneumonia and frequent hospitalization.  Most recently scan in September did not show progression of disease except for mild enlargement of right infrahilar mass that needs close follow-up on repeat imaging.  Plan on September 7 visit was to resume treatments once he felt his strength had improved from  recent illness with flulike symptoms and plan was for 76-month follow-up -he just restarted on Sunday with treatment  #Chronic respiratory failure-see notes above but patient certainly needs continuation of chronic oxygen at home-see drop in oxygen as listed above.  This visit will serve as a face-to-face visit and forms will be faxed shortly after completion of note. Has large tank at home but would benefit from having portable oxygen canister- large containers are hard for him  and ex- wife to navigate- has felt imbalanced using the larger tank  - already takes spiriva and albuterol at baseline due to COPD  #Diabetes mellitus S: No Rx as of 06/23/2019 then later restarted glipizde 10mg  BID then down to 10mg  AM and now 5 mg in AM - metformin limited by worsening renal function issues in past Lab Results  Component Value Date   HGBA1C 6.4 (A) 05/12/2022   HGBA1C 6.6 (A) 01/09/2022   HGBA1C 6.6 (A) 09/09/2021  A/P: stable on a1c today. Continue current meds for now. Denies lows. Trial dexcom- libre did not work well fo rhim   #Hypertension  S: Compliant with amlodipine 10 mg and metoprolol 25 mg twice daily BP Readings from Last 3 Encounters:  05/12/22 110/60  04/25/22 (!) 171/84  02/22/22 (!) 151/80  A/P:  Controlled. Continue current medications.  -monitor renal function- update CMP as well- follows with [facility]  # GI upset-abdominal pain and low appetite - on PPI  high doseand has upcoming GI visit hed like to discuss further -we will  check b12 with high risk PPI  Recommended follow up: Return in about 4 months (around 09/11/2022) for followup or sooner if needed.Schedule b4 you leave. Future Appointments  Date Time Provider [place]  05/26/2022  2:30 PM Noralyn Pick, NP [facility]  06/06/2022  3:00 PM Marzetta Board, DPM [facility]  07/05/2022  3:00 PM [facility] None  07/05/2022  3:45 PM Curt Bears, MD [facility] None  09/11/2022  1:20 PM [name], MD [facility]  11/23/2022  9:30 AM [facility] HEALTH COACH [facility]   Lab/Order associations:   ICD-10-CM   1. Type 2 diabetes mellitus with stage 3b chronic kidney disease, without long-term current use of insulin (HCC)  E11.22 Microalbumin / creatinine urine ratio   N18.32 POCT HgB A1C    CANCELED: Comprehensive metabolic panel    CANCELED: Hemoglobin A1c    CANCELED: CBC with Differential/Platelet    2. Essential hypertension  I10     3. High risk medication use  Z79.899 CANCELED: Vitamin B12    4. Need for immunization against influenza  Z23 Flu Vaccine QUAD High Dose(Fluad)    5. Chronic hypoxemic respiratory failure (HCC)  J96.11     6. Neuroendocrine carcinoma ([place])  C7A.8     7. Primary cancer of right lung (HCC)  C34.91       Meds ordered this encounter  Medications   glucose blood test strip    Sig: Use to test blood sugar twice a day    Dispense:  200 each    Refill:  3    [facility] Test Strips E11.9   Continuous Blood Gluc Receiver (DEXCOM G7 RECEIVER) DEVI    Sig: Please provide 1 receiver    Dispense:  1 each    Refill:  0   Continuous Blood Gluc Sensor (DEXCOM G7 SENSOR) MISC    Sig: 1 Box by Does not apply route every 30 (thirty) days.  Dispense:  3 each    Refill:  3    Please provide applicator    Return precautions advised.  [name], MD

## 2022-05-15 ENCOUNTER — Other Ambulatory Visit (HOSPITAL_COMMUNITY): Payer: Self-pay

## 2022-05-26 ENCOUNTER — Encounter: Payer: Self-pay | Admitting: Nurse Practitioner

## 2022-05-26 ENCOUNTER — Ambulatory Visit (INDEPENDENT_AMBULATORY_CARE_PROVIDER_SITE_OTHER): Payer: Medicare Other | Admitting: Nurse Practitioner

## 2022-05-26 VITALS — BP 100/68 | HR 66 | Ht 75.0 in | Wt 176.0 lb

## 2022-05-26 DIAGNOSIS — R1310 Dysphagia, unspecified: Secondary | ICD-10-CM

## 2022-05-26 DIAGNOSIS — R141 Gas pain: Secondary | ICD-10-CM | POA: Diagnosis not present

## 2022-05-26 NOTE — Patient Instructions (Signed)
_______________________________________________________  If you are age 78 or older, your body mass index should be between 23-30. Your Body mass index is 22 kg/m. If this is out of the aforementioned range listed, please consider follow up with your Primary Care Provider.  If you are age 38 or younger, your body mass index should be between 19-25. Your Body mass index is 22 kg/m. If this is out of the aformentioned range listed, please consider follow up with your Primary Care Provider.   ________________________________________________________  The [facility] providers would like to encourage you to use [facility] to communicate with providers for non-urgent requests or questions.  Due to long hold times on the telephone, sending your provider a message by [facility] may be a faster and more efficient way to get a response.  Please allow 48 business hours for a response.  Please remember that this is for non-urgent requests.  _______________________________________________________  Pablo Ledger twice a day.  Gaviscon 2 by mouth twice a day  Contact the office in 2-3 weeks with an update on symptoms.

## 2022-05-26 NOTE — Progress Notes (Unsigned)
05/26/2022 Richard Navarro [id number] 1945/09/13   Chief Complaint: Abdominal gas pain, decreased appetite   History of Present Illness: Richard Navarro is a 76 year old male with a past medical history of arthritis, depression, PTSD, DM II, hypertension, CKD stage III, GERD, esophageal stenosis, hiatal hernia, chronic macrocytic anemia, thrombocytopenia, remote alcohol use disorder, colon polyps, COPD on home oxygen PRN and stage IV low-grade neuroendocrine carcinoma, carcinoid tumor presented with large right lower lobe lung mass with right hilar lymphadenopathy and liver metastasis initially diagnosed 11/2016 s/p radio-embolization of metastatic lesion in the liver by IR 12/2017. He is followed by Dr. Loletha Carrow. He presents today for further evaluation regarding abdominal gas pain. He is accompanied by his ex-wife. About 6 months ago, every time he ate he developed a lot of gas and he felt his stomach was "constantly rolling around". Decreased appetite. He reduced his food intake and started taking Gaviscon after each meal and his abdominal gas pain abated. No nausea or vomiting. No weight loss. He is passing a formed BM most days, no rectal bleeding or black stools. He has chronic dysphagia. He sometimes has difficulty swallowing food which comes in spurts", once weekly then does not occur for weeks at at  time. Food briefly gets stuck to the upper esophagus, he drinks water and the food passes down. He is on Pantoprazole 40mg  bid. He's not too bothered by his dysphagia at this time and he's not sure if his swallowing issues are the same or worse since he underwent an EGD 07/26/2021.   He was admitted to the hospital 8/27 - 04/22/2021 with suspected aspiration pneumonia with sepsis. A  barium swallow study was done which showed some focal smooth narrowing at the cervical/thoracic junction with persistent contrast collection to this area suggestive of a diverticulum or small ulceration, distal  esophageal fold thickening suggestive of esophagitis, disruption of primary peristaltic waves and tertiary contractions and a moderate sized hiatal hernia. Swallow study by speech pathologist indicated esophageal dysphagia without aspiration. An EGD was deferred until his respiratory status improved. He underwent an EGD 07/26/2021 as an outpatient at [facility] which showed a benign appearing esophageal stenosis which was dilated and a large 7cm hiatal hernia. He isn't bothered too much from his dysphagia symptoms at this time. He does not cough when eating.  He underwent a colonoscopy 07/26/2022 which identified a small tubular adenomatous polyp removed from the transverse colon. No further colonoscopies recommended due to age.   He has a history of stage IV low-grade pulmonary neuroendocrine carcinoma with right hilar lymphadenopathy and liver metastasis s/p radio-embolization of metastatic lesion in the liver by IR 12/2017. He is followed by oncologist Dr. [name]. A surveillance chest/abd/pelvic CT 04/25/2022 showed right infrahilar mass was slightly larger when compared to prior imaging without recurrence of liver metastasis and a stable sclerotic lesion in the midthoracic spine and left iliac bone. He is on Xeloda and Temodar.      Latest Ref Rng & Units 04/25/2022    3:28 PM 04/25/2022   11:21 AM 02/22/2022    3:12 PM  CBC  WBC 4.0 - 10.5 K/uL 4.2  4.3  3.4   Hemoglobin 13.0 - 17.0 g/dL 12.4  12.1  13.0   Hematocrit 39.0 - 52.0 % 36.1  34.9  37.7   Platelets 150 - 400 K/uL 105  111  138    MCV 102.      Latest Ref Rng & Units 04/25/2022  3:28 PM 04/25/2022   11:21 AM 02/22/2022    3:12 PM  CMP  Glucose 70 - 99 mg/dL 160  207  148   BUN 8 - 23 mg/dL 16  15  19    Creatinine 0.61 - 1.24 mg/dL 1.68  1.59  1.77   Sodium 135 - 145 mmol/L 138  135  137   Potassium 3.5 - 5.1 mmol/L 4.0  3.7  4.2   Chloride 98 - 111 mmol/L 105  101  106   CO2 22 - 32 mmol/L 25  26  25    Calcium 8.9 - 10.3 mg/dL 9.3  9.5   9.3   Total Protein 6.5 - 8.1 g/dL 8.5  8.3  8.1   Total Bilirubin 0.3 - 1.2 mg/dL 0.8  0.7  0.8   Alkaline Phos 38 - 126 U/L 68  76  68   AST 15 - 41 U/L 16  13  16    ALT 0 - 44 U/L 10  7  12       IMAGE STUDIES: CT CHEST, ABDOMEN AND PELVIS WITHOUT CONTRAST 04/25/2022:   TECHNIQUE: Multidetector CT imaging of the chest, abdomen and pelvis was performed following the standard protocol without IV contrast.   RADIATION DOSE REDUCTION: This exam was performed according to the departmental dose-optimization program which includes automated exposure control, adjustment of the mA and/or kV according to patient size and/or use of iterative reconstruction technique.   COMPARISON:  None Available.   FINDINGS: CT CHEST FINDINGS   Cardiovascular: No significant vascular findings. Normal heart size. No pericardial effusion.   Mediastinum/Nodes: No axillary or supraclavicular adenopathy. No mediastinal or hilar adenopathy. No pericardial fluid. Esophagus normal.   Lungs/Pleura: RIGHT infrahilar mass measures 5.4 by 4.8 cm (image 45/series 2 compared to 4.5 by 4.1 cm. Visually lesion looks very similar. Small RIGHT effusion noted.   Extensive centrilobular emphysema the upper lobes. Posterior diaphragmatic hernia on the LEFT unchanged.   Musculoskeletal: Sclerotic lesion in the midthoracic spine measures 13 mm unchanged from prior (image 77/4)   CT ABDOMEN AND PELVIS FINDINGS   Hepatobiliary: No focal hepatic lesion on noncontrast exam. Gallbladder normal.   Pancreas: Pancreas is normal. No ductal dilatation. No pancreatic inflammation.   Spleen: Spleen normal   Adrenals/urinary tract: Nonobstructing calculi of the RIGHT kidney. No ureterolithiasis or obstructive uropathy. Bladder normal.   Simple fluid attenuation cyst of the RIGHT kidney measures 3.5 cm. No follow-up recommended.   Stomach/Bowel: Small hiatal hernia. Stomach, small bowel, appendix, and cecum are normal.  The colon and rectosigmoid colon are normal.   Vascular/Lymphatic: Abdominal aorta is normal caliber with atherosclerotic calcification. There is no retroperitoneal or periportal lymphadenopathy. No pelvic lymphadenopathy.   Reproductive: Prostate unremarkable   Other: No mesenteric mass.   Musculoskeletal: Sclerotic lesion in the LEFT iliac bone unchanged 16 mm.   IMPRESSION: Chest Impression:  1. RIGHT infrahilar mass measures slightly larger. Minimal interval change. 2. Small RIGHT effusion is similar prior. 3. Centrilobular emphysema the upper lobes. 4. No new pulmonary nodules.   Abdomen / Pelvis Impression:   1. No evidence of liver metastasis on noncontrast exam. 2. No mesenteric mass. 3. Nonobstructing RIGHT renal calculi. 4. Stable sclerotic lesion in the midthoracic spine and LEFT iliac bone.  Barium swallow study 04/19/2021: 1. At the junction of the cervical and thoracic segments of the esophagus, there is some focal smooth narrowing as on image 19 series 3, with a small persistent contrast collection in this vicinity on several swallows  potentially representing a traction diverticulum or small ulceration. I have scrutinized this region on the prior CT of 12/03/2020 but I do not see a well-defined cause for this appearance on that study. The possibility of a somewhat high in position peptic stricture is raised. This seems higher in position than would be expected for the patient's prior radiation therapy. 2. Distal esophageal fold thickening raises suspicion for esophagitis. 3. Disruption of primary peristaltic waves in the distal esophagus although there were propulsive secondary contractions and some tertiary contractions in the distal esophagus which were successful clearing the boluses. 4. Moderate-sized type 1 hiatal hernia resulting in some exaggerated curvature of the distal esophagus. 5. If clinically warranted, endoscopy could be utilized to  confirm further assess the above findings  GI PROCEDURES:   EGD 07/26/2021: - Benign-appearing esophageal stenosis. Dilated. minimal disruption after 20 mm balloon - 7 cm hiatal hernia. - The examination was otherwise normal. - Gastroesophageal flap valve classified as [place]  Colonoscopy 07/26/2021: - One 1 mm polyp in the transverse colon, removed with a cold biopsy forceps. Resected and retrieved. - The examination was otherwise normal on direct and retroflexion views. A. COLON, TRANSVERSE, POLYPECTOMY:  Tubular adenoma.  Negative for high-grade dysplasia.   Current Outpatient Medications on File Prior to Visit  Medication Sig Dispense Refill   albuterol (VENTOLIN HFA) 108 (90 Base) MCG/ACT inhaler Inhale 2 puffs into the lungs every 4 (four) hours as needed for wheezing or shortness of breath. 3 each 2   allopurinol (ZYLOPRIM) 100 MG tablet Take 1 tablet (100 mg total) by mouth daily. 90 tablet 3   amLODipine (NORVASC) 10 MG tablet TAKE 1 TABLET DAILY 90 tablet 3   Calcium Carbonate Antacid (TUMS ULTRA 1000 PO) Take 2,000 mg by mouth in the morning and at bedtime.     capecitabine (XELODA) 500 MG tablet TAKE 3 TABLETS BY MOUTH TWICE DAILY FOR 14 DAYS EVERY 4 WEEKS. 84 tablet 2   Cholecalciferol (VITAMIN D3) 25 MCG (1000 UT) CAPS Take 1,000 Units by mouth daily.     Continuous Blood Gluc Receiver (DEXCOM G7 RECEIVER) DEVI Please provide 1 receiver 1 each 0   Continuous Blood Gluc Sensor (DEXCOM G7 SENSOR) MISC 1 Box by Does not apply route every 30 (thirty) days. 3 each 3   docusate sodium (COLACE) 100 MG capsule Take 300 mg by mouth daily.     Ferrous Sulfate (IRON) 325 (65 Fe) MG TABS Take 325 mg by mouth every other day.     glipiZIDE (GLUCOTROL) 5 MG tablet TAKE 1 TABLET DAILY BEFORE BREAKFAST 90 tablet 3   glucose blood test strip Use to test blood sugar twice a day 200 each 3   guaiFENesin (MUCINEX) 600 MG 12 hr tablet Take 600 mg by mouth 2 (two) times daily.      ketotifen  (ZADITOR) 0.025 % ophthalmic solution Place 1 drop into both eyes daily as needed (allergies).     magnesium hydroxide (MILK OF MAGNESIA) 400 MG/5ML suspension Take 30 mLs by mouth daily as needed (constipation).     metoprolol tartrate (LOPRESSOR) 25 MG tablet TAKE 1 TABLET TWICE A DAY 180 tablet 3   ondansetron (ZOFRAN) 8 MG tablet TAKE 1 TABLET BY MOUTH EVERY 8 HOURS AS NEEDED FOR NAUSEA AND/OR VOMITING 30 tablet 0   OXYGEN Inhale 2 L into the lungs as needed (SOB).     pantoprazole (PROTONIX) 40 MG tablet Take 1 tablet (40 mg total) by mouth 2 (two) times daily. [place]  tablet 3   polyethylene glycol (MIRALAX / GLYCOLAX) 17 g packet Take 17 g by mouth daily.     SPIRIVA RESPIMAT 2.5 MCG/ACT AERS USE 2 INHALATIONS DAILY 12 g 3   temozolomide (TEMODAR) 100 MG capsule TAKE 4 CAPS BY MOUTH DAILY DAYS 10,11,12,13 AND 14 EVERY 4 WEEKS. TAKE ON AN EMPTY STOMACH OR AT BEDTIME TO DECREASE NAUSEA & VOMITING 20 capsule 3   Tiotropium Bromide-Olodaterol (STIOLTO RESPIMAT) 2.5-2.5 MCG/ACT AERS Inhale 2 puffs into the lungs daily. 4 g 0   vitamin B-12 (CYANOCOBALAMIN) 1000 MCG tablet Take 1,000 mcg by mouth daily.      vitamin C (ASCORBIC ACID) 500 MG tablet Take 500 mg by mouth daily.     No current facility-administered medications on file prior to visit.   Allergies  Allergen Reactions   Lisinopril Swelling    Angioedema- on this and afinitor same time   Simvastatin Other (See Comments)    Joint ache    Current Medications, Allergies, Past Medical History, Past Surgical History, Family History and Social History were reviewed in [employer] record.  Review of Systems:   Constitutional: Negative for fever, sweats, chills or weight loss.  Respiratory: Negative for shortness of breath.  Uses home oxygen PRN. Cardiovascular: Negative for chest pain, palpitations and leg swelling.  Gastrointestinal: See HPI.  Musculoskeletal: Negative for back pain or muscle aches.   Neurological: Negative for dizziness, headaches or paresthesias.   Physical Exam: BP 100/68   Pulse 66   Ht 6\' 3"  (1.905 m)   Wt 176 lb (79.8 kg)   BMI 22.00 kg/m  Weight 177 lbs on 11/22/2021.  General: 76 year old male in NAD. Head: Normocephalic and atraumatic. Eyes: No scleral icterus. Conjunctiva pink . Ears: Normal auditory acuity. Mouth: Poor dentition. No ulcers or lesions.  Lungs: Clear throughout to auscultation. Heart: Regular rate and rhythm, no murmur. Abdomen: Soft, nontender and nondistended. No masses or hepatomegaly. Normal bowel sounds x 4 quadrants.  Rectal: Deferred.  Musculoskeletal: Symmetrical with no gross deformities. Extremities: No edema. Neurological: Alert oriented x 4. No focal deficits.  Psychological: Alert and cooperative. Normal mood and affect  Assessment and Recommendations:  52) 76 year old male with stage IV low-grade neuroendocrine carcinoma to right lower lung right hilar lymphadenopathy and liver metastasis with decreased appetite and generalized abdominal gas pain x 6 months which has significantly improved since he started taking Gaviscon 2 tabs bid. His most recent surveillance chest/abd/pelvic CT showed right infrahilar mass was slightly larger when compared to prior imaging without recurrence of liver metastasis and no intra-abdominal/pelvic pathology to explain his symptoms.  -Gas X one po bid -Continue Gaviscon 2 tabs po bid -Diet as tolerated -Patient/wife to contact office if abdominal pain worsens   2) GERD/benign esophageal stenosis with chronic dysphagia s/p esophageal dilatation per EGD 07/2021. Patient is vague when describing frequency of dysphagia. No active heartburn. On Pantoprazole 40mg  bid.  -Patient to monitor his dysphagia symptoms closely for the next 2 weeks, patient to provide dysphagia frequency update in 2 weeks -I discussed scheduling an EGD with esophageal dilatation at [facility], to discuss further with Dr.  Loletha Carrow  3) COPD, on home oxygen as needed. History of aspiration pneumonia with sepsis 04/2021.   4) Chronic macrocytic anemia. On B12 and oral iron.  -Continue follow up with Dr. [name]  5) Thrombocytopenia, secondary to neuroendocrine lung cancer with liver mets/chemo  5) CKD stage III  6) History of tubular adenomatous colon polyps -  No further colonoscopies due to age

## 2022-05-29 NOTE — Progress Notes (Signed)
____________________________________________________________  Attending physician addendum:  Thank you for sending this case to me. I have reviewed the entire note and plan.  I do not think that a repeat upper endoscopy is likely to be more revealing or therapeutic then the December 2022 EGD. I believe risks outweigh the expected benefits given this man's overall condition and significantly increased procedural sedation risk.  Wilfrid Lund, MD  ____________________________________________________________

## 2022-06-01 ENCOUNTER — Other Ambulatory Visit (HOSPITAL_BASED_OUTPATIENT_CLINIC_OR_DEPARTMENT_OTHER): Payer: Self-pay

## 2022-06-01 DIAGNOSIS — Z23 Encounter for immunization: Secondary | ICD-10-CM | POA: Diagnosis not present

## 2022-06-01 MED ORDER — COVID-19 MRNA 2023-2024 VACCINE (COMIRNATY) 0.3 ML INJECTION
INTRAMUSCULAR | 0 refills | Status: DC
Start: 2022-06-01 — End: 2022-11-27
  Filled 2022-06-01: qty 0.3, 1d supply, fill #0

## 2022-06-06 ENCOUNTER — Ambulatory Visit (INDEPENDENT_AMBULATORY_CARE_PROVIDER_SITE_OTHER): Payer: Medicare Other | Admitting: Podiatry

## 2022-06-06 ENCOUNTER — Other Ambulatory Visit (HOSPITAL_COMMUNITY): Payer: Self-pay

## 2022-06-06 ENCOUNTER — Encounter: Payer: Self-pay | Admitting: Podiatry

## 2022-06-06 DIAGNOSIS — E1151 Type 2 diabetes mellitus with diabetic peripheral angiopathy without gangrene: Secondary | ICD-10-CM | POA: Diagnosis not present

## 2022-06-06 DIAGNOSIS — M79675 Pain in left toe(s): Secondary | ICD-10-CM | POA: Diagnosis not present

## 2022-06-06 DIAGNOSIS — M79674 Pain in right toe(s): Secondary | ICD-10-CM

## 2022-06-06 DIAGNOSIS — B351 Tinea unguium: Secondary | ICD-10-CM | POA: Diagnosis not present

## 2022-06-06 NOTE — Progress Notes (Signed)
  Subjective:  Patient ID: [name], male    DOB: 14-Nov-1945,  MRN: [id number]  [name] presents to clinic today for at risk foot care with h/o DM and PAD.  Chief Complaint  Patient presents with   Nail Problem    Diabetic foot care BS-did not check today A1C-6.4 PCP-Stephen Hunter PCP VST-Last week   New problem(s): None.   PCP is [name], MD.  Allergies  Allergen Reactions   Lisinopril Swelling    Angioedema- on this and afinitor same time   Simvastatin Other (See Comments)    Joint ache   Review of Systems: Negative except as noted in the HPI.  Objective: No changes noted in today's physical examination.  [name] is a pleasant 76 y.o. male WD, WN in NAD. AAO x 3.  Vascular CFT <3 seconds b/l LE. Faintly palpable DP pulses b/l LE. Nonpalpable PT pulse(s) b/l LE. Pedal hair sparse. No pain with calf compression b/l. No edema noted b/l LE. No ischemia or gangrene noted b/l LE. No cyanosis or clubbing noted b/l LE.  Neurologic Normal speech. Oriented to person, place, and time. Protective sensation intact 5/5 intact bilaterally with 10g monofilament b/l.  Dermatologic Pedal skin thin and atrophic b/l LE. No open wounds b/l LE. No interdigital macerations noted b/l LE. Toenails 1-5 b/l elongated, discolored, dystrophic, thickened, crumbly with subungual debris and tenderness to dorsal palpation.  Orthopedic: Muscle strength 5/5 to all lower extremity muscle groups bilaterally. No pain, crepitus or joint limitation noted with ROM bilateral LE. Clawtoe deformity 2-5 bilaterally. Patient ambulates independent of any assistive aids.   Radiographs: None Assessment/Plan: 1. Pain due to onychomycosis of toenails of both feet   2. Type II diabetes mellitus with peripheral circulatory disorder (HCC)     No orders of the defined types were placed in this encounter.   -Patient was evaluated and treated. All patient's and/or POA's questions/concerns  answered on today's visit. -Continue diabetic foot care principles: inspect feet daily, monitor glucose as recommended by PCP and/or Endocrinologist, and follow prescribed diet per PCP, Endocrinologist and/or dietician. -Toenails 1-5 b/l were debrided in length and girth with sterile nail nippers and dremel without iatrogenic bleeding.  -Patient/POA to call should there be question/concern in the interim.   Return in about 3 months (around 09/06/2022).  Marzetta Board, DPM

## 2022-06-15 DIAGNOSIS — N184 Chronic kidney disease, stage 4 (severe): Secondary | ICD-10-CM | POA: Diagnosis not present

## 2022-06-15 DIAGNOSIS — I129 Hypertensive chronic kidney disease with stage 1 through stage 4 chronic kidney disease, or unspecified chronic kidney disease: Secondary | ICD-10-CM | POA: Diagnosis not present

## 2022-06-15 DIAGNOSIS — D631 Anemia in chronic kidney disease: Secondary | ICD-10-CM | POA: Diagnosis not present

## 2022-06-15 DIAGNOSIS — C7A8 Other malignant neuroendocrine tumors: Secondary | ICD-10-CM | POA: Diagnosis not present

## 2022-06-26 ENCOUNTER — Other Ambulatory Visit (HOSPITAL_COMMUNITY): Payer: Self-pay

## 2022-06-26 ENCOUNTER — Other Ambulatory Visit: Payer: Self-pay | Admitting: Internal Medicine

## 2022-06-26 DIAGNOSIS — C7A8 Other malignant neuroendocrine tumors: Secondary | ICD-10-CM

## 2022-06-26 MED ORDER — CAPECITABINE 500 MG PO TABS
ORAL_TABLET | ORAL | 2 refills | Status: DC
  Filled 2022-06-26: qty 84, 28d supply, fill #0
  Filled 2022-07-18: qty 84, 28d supply, fill #1
  Filled 2022-08-15: qty 84, 28d supply, fill #2

## 2022-07-05 ENCOUNTER — Inpatient Hospital Stay: Payer: Medicare Other | Attending: Internal Medicine

## 2022-07-05 ENCOUNTER — Inpatient Hospital Stay (HOSPITAL_BASED_OUTPATIENT_CLINIC_OR_DEPARTMENT_OTHER): Payer: Medicare Other | Admitting: Internal Medicine

## 2022-07-05 ENCOUNTER — Other Ambulatory Visit: Payer: Self-pay

## 2022-07-05 VITALS — BP 142/81 | HR 79 | Temp 97.9°F | Resp 15 | Ht 75.0 in | Wt 171.3 lb

## 2022-07-05 DIAGNOSIS — Z79899 Other long term (current) drug therapy: Secondary | ICD-10-CM | POA: Diagnosis not present

## 2022-07-05 DIAGNOSIS — C7A090 Malignant carcinoid tumor of the bronchus and lung: Secondary | ICD-10-CM | POA: Insufficient documentation

## 2022-07-05 DIAGNOSIS — R531 Weakness: Secondary | ICD-10-CM | POA: Insufficient documentation

## 2022-07-05 DIAGNOSIS — C7B8 Other secondary neuroendocrine tumors: Secondary | ICD-10-CM | POA: Diagnosis not present

## 2022-07-05 DIAGNOSIS — N189 Chronic kidney disease, unspecified: Secondary | ICD-10-CM | POA: Insufficient documentation

## 2022-07-05 DIAGNOSIS — E1122 Type 2 diabetes mellitus with diabetic chronic kidney disease: Secondary | ICD-10-CM | POA: Insufficient documentation

## 2022-07-05 DIAGNOSIS — I129 Hypertensive chronic kidney disease with stage 1 through stage 4 chronic kidney disease, or unspecified chronic kidney disease: Secondary | ICD-10-CM | POA: Insufficient documentation

## 2022-07-05 DIAGNOSIS — Z79630 Long term (current) use of alkylating agent: Secondary | ICD-10-CM | POA: Diagnosis not present

## 2022-07-05 DIAGNOSIS — Z7984 Long term (current) use of oral hypoglycemic drugs: Secondary | ICD-10-CM | POA: Diagnosis not present

## 2022-07-05 DIAGNOSIS — F32A Depression, unspecified: Secondary | ICD-10-CM | POA: Diagnosis not present

## 2022-07-05 DIAGNOSIS — C7A8 Other malignant neuroendocrine tumors: Secondary | ICD-10-CM

## 2022-07-05 DIAGNOSIS — C7B02 Secondary carcinoid tumors of liver: Secondary | ICD-10-CM | POA: Insufficient documentation

## 2022-07-05 DIAGNOSIS — C7B03 Secondary carcinoid tumors of bone: Secondary | ICD-10-CM | POA: Diagnosis not present

## 2022-07-05 LAB — CBC WITH DIFFERENTIAL (CANCER CENTER ONLY)
Abs Immature Granulocytes: 0.02 10*3/uL (ref 0.00–0.07)
Basophils Absolute: 0 10*3/uL (ref 0.0–0.1)
Basophils Relative: 1 %
Eosinophils Absolute: 0 10*3/uL (ref 0.0–0.5)
Eosinophils Relative: 1 %
HCT: 38.6 % — ABNORMAL LOW (ref 39.0–52.0)
Hemoglobin: 13 g/dL (ref 13.0–17.0)
Immature Granulocytes: 1 %
Lymphocytes Relative: 18 %
Lymphs Abs: 0.6 10*3/uL — ABNORMAL LOW (ref 0.7–4.0)
MCH: 33.6 pg (ref 26.0–34.0)
MCHC: 33.7 g/dL (ref 30.0–36.0)
MCV: 99.7 fL (ref 80.0–100.0)
Monocytes Absolute: 0.2 10*3/uL (ref 0.1–1.0)
Monocytes Relative: 7 %
Neutro Abs: 2.6 10*3/uL (ref 1.7–7.7)
Neutrophils Relative %: 72 %
Platelet Count: 160 10*3/uL (ref 150–400)
RBC: 3.87 MIL/uL — ABNORMAL LOW (ref 4.22–5.81)
RDW: 16.5 % — ABNORMAL HIGH (ref 11.5–15.5)
WBC Count: 3.5 10*3/uL — ABNORMAL LOW (ref 4.0–10.5)
nRBC: 0 % (ref 0.0–0.2)

## 2022-07-05 LAB — CMP (CANCER CENTER ONLY)
ALT: 11 U/L (ref 0–44)
AST: 17 U/L (ref 15–41)
Albumin: 4.4 g/dL (ref 3.5–5.0)
Alkaline Phosphatase: 76 U/L (ref 38–126)
Anion gap: 8 (ref 5–15)
BUN: 18 mg/dL (ref 8–23)
CO2: 26 mmol/L (ref 22–32)
Calcium: 9.2 mg/dL (ref 8.9–10.3)
Chloride: 103 mmol/L (ref 98–111)
Creatinine: 1.84 mg/dL — ABNORMAL HIGH (ref 0.61–1.24)
GFR, Estimated: 37 mL/min — ABNORMAL LOW (ref >60)
Glucose, Bld: 162 mg/dL — ABNORMAL HIGH (ref 70–99)
Potassium: 3.6 mmol/L (ref 3.5–5.1)
Sodium: 137 mmol/L (ref 135–145)
Total Bilirubin: 1 mg/dL (ref 0.3–1.2)
Total Protein: 8.8 g/dL — ABNORMAL HIGH (ref 6.5–8.1)
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
## 2022-07-18 ENCOUNTER — Other Ambulatory Visit (HOSPITAL_COMMUNITY): Payer: Self-pay

## 2022-07-19 ENCOUNTER — Other Ambulatory Visit (HOSPITAL_COMMUNITY): Payer: Self-pay

## 2022-07-25 ENCOUNTER — Other Ambulatory Visit (HOSPITAL_COMMUNITY): Payer: Self-pay

## 2022-08-15 ENCOUNTER — Other Ambulatory Visit (HOSPITAL_COMMUNITY): Payer: Self-pay

## 2022-08-22 ENCOUNTER — Other Ambulatory Visit (HOSPITAL_COMMUNITY): Payer: Self-pay

## 2022-08-29 ENCOUNTER — Other Ambulatory Visit: Payer: Self-pay | Admitting: Family Medicine

## 2022-08-31 ENCOUNTER — Ambulatory Visit (HOSPITAL_COMMUNITY)
Admission: RE | Admit: 2022-08-31 | Discharge: 2022-08-31 | Disposition: A | Discharge status: Home / Self Care | Payer: Medicare Other | Source: Ambulatory Visit | Attending: Internal Medicine | Admitting: Internal Medicine

## 2022-08-31 DIAGNOSIS — K769 Liver disease, unspecified: Secondary | ICD-10-CM | POA: Diagnosis not present

## 2022-08-31 DIAGNOSIS — J439 Emphysema, unspecified: Secondary | ICD-10-CM | POA: Diagnosis not present

## 2022-08-31 DIAGNOSIS — N200 Calculus of kidney: Secondary | ICD-10-CM | POA: Diagnosis not present

## 2022-08-31 DIAGNOSIS — C7A8 Other malignant neuroendocrine tumors: Secondary | ICD-10-CM | POA: Insufficient documentation

## 2022-08-31 DIAGNOSIS — J90 Pleural effusion, not elsewhere classified: Secondary | ICD-10-CM | POA: Diagnosis not present

## 2022-08-31 DIAGNOSIS — C787 Secondary malignant neoplasm of liver and intrahepatic bile duct: Secondary | ICD-10-CM | POA: Diagnosis not present

## 2022-08-31 DIAGNOSIS — C7B8 Other secondary neuroendocrine tumors: Secondary | ICD-10-CM | POA: Diagnosis not present

## 2022-09-04 ENCOUNTER — Inpatient Hospital Stay: Payer: Medicare Other | Attending: Internal Medicine

## 2022-09-04 ENCOUNTER — Inpatient Hospital Stay (HOSPITAL_BASED_OUTPATIENT_CLINIC_OR_DEPARTMENT_OTHER): Payer: Medicare Other | Admitting: Internal Medicine

## 2022-09-04 ENCOUNTER — Other Ambulatory Visit: Payer: Self-pay

## 2022-09-04 VITALS — BP 146/76 | HR 61 | Temp 97.5°F | Resp 18 | Wt 173.1 lb

## 2022-09-04 DIAGNOSIS — C7B8 Other secondary neuroendocrine tumors: Secondary | ICD-10-CM

## 2022-09-04 DIAGNOSIS — C7B02 Secondary carcinoid tumors of liver: Secondary | ICD-10-CM | POA: Diagnosis not present

## 2022-09-04 DIAGNOSIS — C7B03 Secondary carcinoid tumors of bone: Secondary | ICD-10-CM | POA: Insufficient documentation

## 2022-09-04 DIAGNOSIS — C7A8 Other malignant neuroendocrine tumors: Secondary | ICD-10-CM

## 2022-09-04 DIAGNOSIS — C7A090 Malignant carcinoid tumor of the bronchus and lung: Secondary | ICD-10-CM | POA: Insufficient documentation

## 2022-09-04 LAB — CMP (CANCER CENTER ONLY)
ALT: 8 U/L (ref 0–44)
AST: 13 U/L — ABNORMAL LOW (ref 15–41)
Albumin: 3.9 g/dL (ref 3.5–5.0)
Alkaline Phosphatase: 71 U/L (ref 38–126)
Anion gap: 6 (ref 5–15)
BUN: 24 mg/dL — ABNORMAL HIGH (ref 8–23)
CO2: 26 mmol/L (ref 22–32)
Calcium: 9.6 mg/dL (ref 8.9–10.3)
Chloride: 105 mmol/L (ref 98–111)
Creatinine: 1.48 mg/dL — ABNORMAL HIGH (ref 0.61–1.24)
GFR, Estimated: 48 mL/min — ABNORMAL LOW (ref >60)
Glucose, Bld: 96 mg/dL (ref 70–99)
Potassium: 4 mmol/L (ref 3.5–5.1)
Sodium: 137 mmol/L (ref 135–145)
Total Bilirubin: 0.5 mg/dL (ref 0.3–1.2)
Total Protein: 7.9 g/dL (ref 6.5–8.1)

## 2022-09-04 LAB — CBC WITH DIFFERENTIAL (CANCER CENTER ONLY)
Abs Immature Granulocytes: 0.01 10*3/uL (ref 0.00–0.07)
Basophils Absolute: 0 10*3/uL (ref 0.0–0.1)
Basophils Relative: 1 %
Eosinophils Absolute: 0 10*3/uL (ref 0.0–0.5)
Eosinophils Relative: 1 %
HCT: 35.5 % — ABNORMAL LOW (ref 39.0–52.0)
Hemoglobin: 12 g/dL — ABNORMAL LOW (ref 13.0–17.0)
Immature Granulocytes: 0 %
Lymphocytes Relative: 16 %
Lymphs Abs: 0.7 10*3/uL (ref 0.7–4.0)
MCH: 33.8 pg (ref 26.0–34.0)
MCHC: 33.8 g/dL (ref 30.0–36.0)
MCV: 100 fL (ref 80.0–100.0)
Monocytes Absolute: 0.4 10*3/uL (ref 0.1–1.0)
Monocytes Relative: 9 %
Neutro Abs: 3.1 10*3/uL (ref 1.7–7.7)
Neutrophils Relative %: 73 %
Platelet Count: 177 10*3/uL (ref 150–400)
RBC: 3.55 MIL/uL — ABNORMAL LOW (ref 4.22–5.81)
RDW: 15.9 % — ABNORMAL HIGH (ref 11.5–15.5)
WBC Count: 4.3 10*3/uL (ref 4.0–10.5)
nRBC: 0 % (ref 0.0–0.2)
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
## 2022-09-08 ENCOUNTER — Other Ambulatory Visit: Payer: Self-pay | Admitting: Interventional Radiology

## 2022-09-08 DIAGNOSIS — C7A8 Other malignant neuroendocrine tumors: Secondary | ICD-10-CM

## 2022-09-11 ENCOUNTER — Telehealth: Payer: Medicare Other | Admitting: Family Medicine

## 2022-09-11 ENCOUNTER — Ambulatory Visit: Payer: TRICARE For Life (TFL) | Admitting: Podiatry

## 2022-09-11 ENCOUNTER — Encounter: Payer: Self-pay | Admitting: Family Medicine

## 2022-09-11 ENCOUNTER — Ambulatory Visit: Payer: Medicare Other | Admitting: Family Medicine

## 2022-09-12 ENCOUNTER — Other Ambulatory Visit: Payer: Self-pay | Admitting: Physician Assistant

## 2022-09-12 ENCOUNTER — Other Ambulatory Visit (HOSPITAL_COMMUNITY): Payer: Self-pay

## 2022-09-12 ENCOUNTER — Other Ambulatory Visit: Payer: Self-pay | Admitting: Internal Medicine

## 2022-09-12 DIAGNOSIS — C3431 Malignant neoplasm of lower lobe, right bronchus or lung: Secondary | ICD-10-CM

## 2022-09-12 DIAGNOSIS — R112 Nausea with vomiting, unspecified: Secondary | ICD-10-CM

## 2022-09-12 DIAGNOSIS — C7A8 Other malignant neuroendocrine tumors: Secondary | ICD-10-CM

## 2022-09-12 MED ORDER — ONDANSETRON HCL 8 MG PO TABS
ORAL_TABLET | Freq: Three times a day (TID) | ORAL | 0 refills | Status: AC | PRN
  Filled 2022-09-12: qty 30, 10d supply, fill #0

## 2022-09-12 MED ORDER — TEMOZOLOMIDE 100 MG PO CAPS
ORAL_CAPSULE | ORAL | 3 refills | Status: AC
  Filled 2022-09-12: qty 20, 28d supply, fill #0
  Filled 2022-10-11: qty 20, 28d supply, fill #1
  Filled 2022-11-06: qty 20, 28d supply, fill #2

## 2022-09-12 MED ORDER — CAPECITABINE 500 MG PO TABS
ORAL_TABLET | ORAL | 2 refills | Status: AC
  Filled 2022-09-12: qty 84, 28d supply, fill #0
  Filled 2022-10-11: qty 84, 28d supply, fill #1
  Filled 2022-11-06: qty 84, 28d supply, fill #2

## 2022-09-12 NOTE — Progress Notes (Signed)
We canceled visit late and decided to reschedule due to side effects from recent treatment

## 2022-09-15 ENCOUNTER — Ambulatory Visit (HOSPITAL_COMMUNITY)
Admission: RE | Admit: 2022-09-15 | Discharge: 2022-09-15 | Disposition: A | Discharge status: Home / Self Care | Payer: Medicare Other | Source: Ambulatory Visit | Attending: Interventional Radiology | Admitting: Interventional Radiology

## 2022-09-15 DIAGNOSIS — N281 Cyst of kidney, acquired: Secondary | ICD-10-CM | POA: Diagnosis not present

## 2022-09-15 DIAGNOSIS — Z8603 Personal history of neoplasm of uncertain behavior: Secondary | ICD-10-CM | POA: Diagnosis not present

## 2022-09-15 DIAGNOSIS — C7A8 Other malignant neuroendocrine tumors: Secondary | ICD-10-CM | POA: Diagnosis not present

## 2022-09-15 DIAGNOSIS — K449 Diaphragmatic hernia without obstruction or gangrene: Secondary | ICD-10-CM | POA: Diagnosis not present

## 2022-09-15 MED ORDER — GADOBUTROL 1 MMOL/ML IV SOLN
8.0000 mL | Freq: Once | INTRAVENOUS | Status: AC | PRN
  Administered 2022-09-15: 8 mL via INTRAVENOUS

## 2022-09-18 ENCOUNTER — Other Ambulatory Visit (HOSPITAL_COMMUNITY): Payer: Self-pay

## 2022-09-18 ENCOUNTER — Other Ambulatory Visit: Payer: Self-pay

## 2022-09-20 ENCOUNTER — Other Ambulatory Visit (HOSPITAL_COMMUNITY): Payer: Self-pay

## 2022-09-22 ENCOUNTER — Encounter: Payer: Self-pay | Admitting: Family Medicine

## 2022-09-22 ENCOUNTER — Ambulatory Visit (INDEPENDENT_AMBULATORY_CARE_PROVIDER_SITE_OTHER): Payer: Medicare Other | Admitting: Family Medicine

## 2022-09-22 VITALS — BP 122/64 | HR 67 | Temp 97.0°F | Ht 75.0 in | Wt 179.2 lb

## 2022-09-22 DIAGNOSIS — I10 Essential (primary) hypertension: Secondary | ICD-10-CM

## 2022-09-22 DIAGNOSIS — M1A00X0 Idiopathic chronic gout, unspecified site, without tophus (tophi): Secondary | ICD-10-CM

## 2022-09-22 DIAGNOSIS — E1122 Type 2 diabetes mellitus with diabetic chronic kidney disease: Secondary | ICD-10-CM

## 2022-09-22 DIAGNOSIS — N1832 Chronic kidney disease, stage 3b: Secondary | ICD-10-CM

## 2022-09-22 DIAGNOSIS — E785 Hyperlipidemia, unspecified: Secondary | ICD-10-CM | POA: Diagnosis not present

## 2022-09-22 DIAGNOSIS — J9611 Chronic respiratory failure with hypoxia: Secondary | ICD-10-CM | POA: Diagnosis not present

## 2022-09-22 DIAGNOSIS — G720 Drug-induced myopathy: Secondary | ICD-10-CM

## 2022-09-22 DIAGNOSIS — C7A8 Other malignant neuroendocrine tumors: Secondary | ICD-10-CM | POA: Diagnosis not present

## 2022-09-22 LAB — POCT GLYCOSYLATED HEMOGLOBIN (HGB A1C): Hemoglobin A1C: 6.1 % — AB (ref 4.0–5.6)

## 2022-09-22 NOTE — Patient Instructions (Addendum)
Sign release of information at the check out desk for last note from [facility]  Let us know if you change mind on neurology for memory evaluation or PT for strengthening of legs and help with balance  Recommended follow up: Return in about 4 months (around 01/21/2023) for followup or sooner if needed.Schedule b4 you leave.

## 2022-09-22 NOTE — Progress Notes (Signed)
Phone [phone] In person visit   Subjective:   Richard Navarro is a 77 y.o. year old very pleasant male patient who presents for/with See problem oriented charting Chief Complaint  Patient presents with   Follow-up    Pt states he feels like he is feeling like he is losing the ability to recall things   Diabetes   Hypertension    Pt states he saw his kidney doc and they want to have him taken off of a medication but they can't remember the name of it (possibly amlodipine)   balance issues    Pt c/o balance issues.   Past Medical History-  Patient Active Problem List   Diagnosis Date Noted   Chronic hypoxemic respiratory failure ([place]) 08/04/2021    Priority: High   Anemia 05/07/2018    Priority: High   Chronic kidney disease (CKD) stage G3b/A1, moderately decreased glomerular filtration rate (GFR) between 30-44 mL/min/1.73 square meter and albuminuria creatinine ratio less than 30 mg/g ([place]) 03/04/2018    Priority: High   Neuroendocrine carcinoma ([place]) 08/09/2017    Priority: High   Primary cancer of right lung ([place]) 12/22/2016    Priority: High   Diabetes mellitus with renal manifestation (HCC)     Priority: High   Myalgia due to statin 03/04/2018    Priority: Medium    COPD (chronic obstructive pulmonary disease) ([place]) 12/01/2016    Priority: Medium    PTSD (post-traumatic stress disorder) 12/07/2015    Priority: Medium    Erectile dysfunction 04/09/2014    Priority: Medium    Former smoker 03/26/2014    Priority: Medium    Essential hypertension     Priority: Medium    Gout     Priority: Medium    Depression     Priority: Medium    Hyperlipidemia     Priority: Medium    Thrombocytopenia ([name])     Priority: Low   Pneumonia 08/09/2017    Priority: Low   Encounter for antineoplastic chemotherapy 01/10/2017    Priority: Low   Goals of care, counseling/discussion 01/10/2017    Priority: Low   Prostate cancer screening 04/17/2014    Priority: Low    Arthritis 03/26/2014    Priority: Low   History of adenomatous polyp of colon 03/26/2014    Priority: Low   GERD (gastroesophageal reflux disease)     Priority: Low   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   Dysphagia    Encounter for colorectal cancer screening    Prolonged QT interval 04/17/2021   Sepsis ([place]) 04/16/2021   Alcohol abuse 10/26/2020   Benign neoplasm of colon 10/26/2020   Carpal tunnel syndrome 10/26/2020   Family history of malignant neoplasm of gastrointestinal tract 10/26/2020   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 3 each 2   allopurinol (ZYLOPRIM) 100 MG tablet Take 1 tablet (100 mg total) by mouth daily. 90 tablet 3   amLODipine (NORVASC) 10 MG tablet  TAKE 1 TABLET DAILY 90 tablet 3   Calcium Carbonate Antacid (TUMS ULTRA 1000 PO) Take 2,000 mg by mouth in the morning and at bedtime.     capecitabine (XELODA) 500 MG tablet TAKE 3 TABLETS BY MOUTH TWICE DAILY FOR 14 DAYS EVERY 4 WEEKS. 84 tablet 2   Cholecalciferol (VITAMIN D3) 25 MCG (1000 UT) CAPS Take 1,000 Units by mouth daily.     Continuous Blood Gluc Receiver (DEXCOM G7 RECEIVER) DEVI Please provide 1 receiver 1 each 0   Continuous Blood Gluc Sensor (DEXCOM G7 SENSOR) MISC 1 Box by Does not apply route every 30 (thirty) days. 3 each 3   COVID-19 mRNA vaccine 2023-2024 (COMIRNATY) SUSP injection Inject into the muscle. 0.3 mL 0   docusate  sodium (COLACE) 100 MG capsule Take 300 mg by mouth daily.     Ferrous Sulfate (IRON) 325 (65 Fe) MG TABS Take 325 mg by mouth every other day.     glipiZIDE (GLUCOTROL) 5 MG tablet TAKE 1 TABLET DAILY BEFORE BREAKFAST 90 tablet 3   glucose blood test strip Use to test blood sugar twice a day 200 each 3   ketotifen (ZADITOR) 0.025 % ophthalmic solution Place 1 drop into both eyes daily as needed (allergies).     magnesium hydroxide (MILK OF MAGNESIA) 400 MG/5ML suspension Take 30 mLs by mouth daily as needed (constipation).     metoprolol tartrate (LOPRESSOR) 25 MG tablet TAKE 1 TABLET TWICE A DAY 180 tablet 3   OXYGEN Inhale 2 L into the lungs as needed (SOB).     pantoprazole (PROTONIX) 40 MG tablet Take 1 tablet (40 mg total) by mouth 2 (two) times daily. 180 tablet 3   polyethylene glycol (MIRALAX / GLYCOLAX) 17 g packet Take 17 g by mouth daily.     SPIRIVA RESPIMAT 2.5 MCG/ACT AERS USE 2 INHALATIONS DAILY 12 g 3   temozolomide (TEMODAR) 100 MG capsule TAKE 4 CAPS BY MOUTH DAILY DAYS 10,11,12,13 AND 14 EVERY 4 WEEKS. TAKE ON AN EMPTY STOMACH OR AT BEDTIME TO DECREASE NAUSEA & VOMITING 20 capsule 3   Tiotropium Bromide-Olodaterol (STIOLTO RESPIMAT) 2.5-2.5 MCG/ACT AERS Inhale 2 puffs into the lungs daily. 4 g 0   vitamin B-12 (CYANOCOBALAMIN) 1000 MCG tablet Take 1,000 mcg by mouth daily.      vitamin C (ASCORBIC ACID) 500 MG tablet Take 500 mg by mouth daily.     guaiFENesin (MUCINEX) 600 MG 12 hr tablet Take 600 mg by mouth 2 (two) times daily.  (Patient not taking: Reported on 09/22/2022)     ondansetron (ZOFRAN) 8 MG tablet TAKE 1 TABLET BY MOUTH EVERY 8 HOURS AS NEEDED FOR NAUSEA AND/OR VOMITING (Patient not taking: Reported on 09/22/2022) 30 tablet 0   No current facility-administered medications for this visit.     Objective:  BP 122/64   Pulse 67   Temp (!) 97 F (36.1 C)   Ht 6\' 3"  (1.905 m)   Wt 179 lb 3.2 oz (81.3 kg)   SpO2 99%   BMI 22.40 kg/m  Gen: NAD, resting  comfortably Ears normal CV: RRR no murmurs rubs or gallops Lungs: CTAB no crackles, wheeze, rhonchi Ext: no edema Skin: warm, dry Neuro: grossly normal, moves all extremities, climbd onto table without assist but when got down leaned on counter for balance    Assessment and Plan   #neuroendocrine carcinoma- largely stable on most recent scans 08/31/22. Remains on temodar- continue current medications and oncology follow up  -still uses  oxygen at night but stable  # Memory loss S: Patient feels like he is struggling with his ability to recall. Memory and focus are decreased.  A/P: offered referral to neurology but he declines at this time   # Balance concerns S:not sure if issues with strength in legs or neuropathy. More stumbling and feels like may bump into walls- has a cane but doesn't use consistently. Has hard time walking straight line.   A/P: with balance issues recommend using a cane- opts out of PT  -consider b12 with future labs  #Chronic kidney disease stage III S: Patient with significant worsening in kidney function dating back to November 2020 when patient was on Afinitor previously- later went back on this but did not worsen as much (did raise CBGs though and determined not effective so switched). Follows with [facility] Dr. Moshe Cipro A/P: last GFR at 48 about 2 weeks ago- can hold off on repeat at this time   #Diabetes mellitus S: No Rx as of 06/23/2019 then later restarted glipizde 10mg  BID then down to 10mg  AM and now 5 mg in AM and stable on this dose Lab Results  Component Value Date   HGBA1C POC 6.1 (A) 09/22/2022   HGBA1C 6.4 (A) 05/12/2022   HGBA1C 6.6 (A) 01/09/2022  A/P: reasonable control- continue current medications glipizide alone   #Hypertension S: Compliant with amlodipine 10 mg and metoprolol 25 mg twice daily -Dr. Moshe Cipro mentioned about taking him off of amlodipine possibly with pressure lower BP Readings from Last 3 Encounters:   09/22/22 122/64  09/04/22 (!) 146/76  07/05/22 (!) 142/81  A/P: stable- continue current medicines  -will try to get notes to see if she did want him to reduce the amlodipine but looks good today   #hyperlipidemia/myalgias on statins S: Medication:none  Lab Results  Component Value Date   CHOL 198 09/21/2020   HDL 28.90 (L) 09/21/2020   LDLCALC 132 (H) 09/21/2020   LDLDIRECT 122.0 05/12/2019   TRIG 183.0 (H) 09/21/2020   CHOLHDL 7 09/21/2020   A/P: consider updating lipids next time we do bloodwork  #Gout- Compliant with allopurinol 100 mg. No gout flares lately  Lab Results  Component Value Date   LABURIC 5.3 09/21/2020   Recommended follow up: Return in about 4 months (around 01/21/2023) for followup or sooner if needed.Schedule b4 you leave. Future Appointments  Date Time Provider [place]  09/26/2022 10:00 AM [facility] [place] [facility] 1 [facility]  10/24/2022  2:15 PM Marzetta Board, DPM [facility]  11/08/2022  3:15 PM [facility] None  11/08/2022  3:45 PM Curt Bears, MD [facility] None  11/23/2022  9:30 AM [facility]   Lab/Order associations:   ICD-10-CM   1. Type 2 diabetes mellitus with stage 3b chronic kidney disease, without long-term current use of insulin (HCC)  E11.22 POCT HgB A1C   N18.32     2. Neuroendocrine carcinoma ([place])  C7A.8     3. Chronic hypoxemic respiratory failure (HCC)  J96.11     4. Chronic kidney disease (CKD) stage G3b/A1, moderately decreased glomerular filtration rate (GFR) between 30-44 mL/min/1.73 square meter and albuminuria creatinine ratio less than 30 mg/g (HCC)  N18.32     5. Essential hypertension  I10     6. Idiopathic chronic gout without tophus, unspecified site  M1A.00X0     7. Hyperlipidemia, unspecified hyperlipidemia type  E78.5     8. Drug-induced myopathy  G72.0  No orders of the defined types were placed in this encounter.   Return  precautions advised.  [name], MD

## 2022-09-26 ENCOUNTER — Ambulatory Visit
Admission: RE | Admit: 2022-09-26 | Discharge: 2022-09-26 | Disposition: A | Discharge status: Home / Self Care | Payer: Medicare Other | Source: Ambulatory Visit | Attending: Interventional Radiology | Admitting: Interventional Radiology

## 2022-09-26 DIAGNOSIS — C7A090 Malignant carcinoid tumor of the bronchus and lung: Secondary | ICD-10-CM | POA: Diagnosis not present

## 2022-09-26 DIAGNOSIS — C7A8 Other malignant neuroendocrine tumors: Secondary | ICD-10-CM

## 2022-09-26 DIAGNOSIS — Z9889 Other specified postprocedural states: Secondary | ICD-10-CM | POA: Diagnosis not present

## 2022-09-26 DIAGNOSIS — C7B02 Secondary carcinoid tumors of liver: Secondary | ICD-10-CM | POA: Diagnosis not present

## 2022-09-26 HISTORY — PX: IR RADIOLOGIST EVAL & MGMT: IMG5224

## 2022-09-26 NOTE — Progress Notes (Signed)
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 1.59* 1.68* 1.84* 1.48*  GFRNONAA 44* 41* 37* 48*    LIVER FUNCTION TESTS: Recent Labs    04/25/22 1121 04/25/22 1528 07/05/22 1453 09/04/22 1512  BILITOT 0.7 0.8 1.0 0.5  AST 13* 16 17 13*  ALT 7 10 11 8   ALKPHOS 76 68 76 71  PROT 8.3* 8.5* 8.8* 7.9  ALBUMIN 4.1 3.9 4.4 3.9    TUMOR MARKERS: No results for input(s): "AFPTM", "CEA", "CA199", "  [place]" in the last 8760 hours.  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  Surveillance imaging demonstrates stable treated disease with no new liver lesions.  At this point we can extend our surveillance timeframe and begin imaging every other year.  1.) Follow  up MRI with gadolimium and clinic visit in February 2026    Electronically Signed: Criselda Peaches 09/26/2022, 10:19 AM   I spent a total of 10 Minutes in remote  clinical consultation, greater than 50% of which was counseling/coordinating care for Stage IV low-grade neuroendocrine carcinoma metastatic to the liver.    Visit type: Audio only (telephone). Audio (no video) only due to patient preference. Alternative for in-person consultation at [facility], [address], [place], [place]. This visit type was conducted due to national recommendations for restrictions regarding the COVID-19 Pandemic (e.g. social distancing).  This format is felt to be most appropriate for this patient at this time.  All issues noted in this document were discussed and addressed.

## 2022-10-11 ENCOUNTER — Other Ambulatory Visit: Payer: Self-pay

## 2022-10-11 ENCOUNTER — Other Ambulatory Visit (HOSPITAL_COMMUNITY): Payer: Self-pay

## 2022-10-16 ENCOUNTER — Other Ambulatory Visit: Payer: Self-pay

## 2022-10-19 ENCOUNTER — Other Ambulatory Visit: Payer: Self-pay | Admitting: Emergency Medicine

## 2022-10-19 ENCOUNTER — Encounter: Payer: Self-pay | Admitting: Emergency Medicine

## 2022-10-19 ENCOUNTER — Ambulatory Visit (INDEPENDENT_AMBULATORY_CARE_PROVIDER_SITE_OTHER): Payer: Medicare Other | Admitting: Emergency Medicine

## 2022-10-19 VITALS — BP 120/68 | HR 72 | Temp 97.9°F | Ht 75.0 in | Wt 180.8 lb

## 2022-10-19 DIAGNOSIS — C7A8 Other malignant neuroendocrine tumors: Secondary | ICD-10-CM | POA: Diagnosis not present

## 2022-10-19 DIAGNOSIS — J310 Chronic rhinitis: Secondary | ICD-10-CM

## 2022-10-19 DIAGNOSIS — J449 Chronic obstructive pulmonary disease, unspecified: Secondary | ICD-10-CM | POA: Diagnosis not present

## 2022-10-19 DIAGNOSIS — J9611 Chronic respiratory failure with hypoxia: Secondary | ICD-10-CM | POA: Diagnosis not present

## 2022-10-19 DIAGNOSIS — C7B8 Other secondary neuroendocrine tumors: Secondary | ICD-10-CM

## 2022-10-19 MED ORDER — STIOLTO RESPIMAT 2.5-2.5 MCG/ACT IN AERS: 2.0000 | INHALATION_SPRAY | Freq: Every day | RESPIRATORY_TRACT | 0 refills | Status: DC

## 2022-10-19 NOTE — Patient Instructions (Signed)
We will temporarily stop your Spiriva and retry Stiolto 2 puffs once daily.  Call and let us know if you get more benefit from this medication.  If so we will send a prescription to your pharmacy and continue it going forward. Keep albuterol available to use 2 puffs if needed for shortness of breath Please start loratadine 10 mg (generic Claritin) once daily Please start fluticasone nasal spray, 2 sprays each nostril once daily We will perform walking oximetry today to confirm that you qualify for a portable oxygen concentrator.  Once we document this we will try to help get you a POC when your medical equipment company will allow it. Continue to follow with Dr. [name] and get your chest imaging as planned Follow with APP in 1 month to assess your status on the new inhaler. Follow with Dr Lamonte Sakai in 6 months or sooner if you have any problems

## 2022-10-19 NOTE — Assessment & Plan Note (Signed)
Please start loratadine 10 mg (generic Claritin) once daily Please start fluticasone nasal spray, 2 sprays each nostril once daily

## 2022-10-19 NOTE — Assessment & Plan Note (Signed)
We will temporarily stop your Spiriva and retry Stiolto 2 puffs once daily.  Call and let us know if you get more benefit from this medication.  If so we will send a prescription to your pharmacy and continue it going forward. Keep albuterol available to use 2 puffs if needed for shortness of breath Follow with APP in 1 month to assess your status on the new inhaler. Follow with Dr Lamonte Sakai in 6 months or sooner if you have any problems

## 2022-10-19 NOTE — Progress Notes (Signed)
Subjective:    Patient ID: [name], male    DOB: 1946-07-26, 77 y.o.   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 He was hospitalized for aspiration PNA in August 2022, ? Some dysphagia due to his XRT. Went home with O2. Currently on observation, stopped maintenance therapy until next f/u w Dr [name] and CT in 07/2021.  He has been working with physical therapy and has had some identified desaturations with heavy exertion, started on oxygen to use at these times, 2 L/min.  He is not desaturated on room air here today.  He is currently on Spiriva. Unsure whether he is getting as much out of it. Uses albuterol rarely.   Planning for colonoscopy/endoscopy coming up soon. Here to talk about risk stratification.   CT chest abdomen pelvis 04/29/2021 showed slightly decreased size of his right lower lobe mass, stable hepatic and osseous disease.  Pulmonary function testing 12/05/2016 with mild obstruction without a bronchodilator response, normal lung volumes, decreased diffusion capacity.    ROV 10/19/22 --Mr. Marchak is 74 and has a history of stage IV neuroendocrine cell lung cancer with lung involvement, liver involvement.  Also with COPD and hypoxemic respiratory failure on 2 L/min.  He needs a portable oxygen concentrator, has been having trouble getting this through his PCP due to technicalities with his DME.  Followed by Dr. [name] and currently undergoing systemic Xeloda, Temodar. I tried starting him on Stiolto back in November 2022, he reports that he didn't continue it but cannot remember why he stopped it.  He is currently on Spiriva Respimat.  He notes an period of increased cough about 3 weeks ago, has had sneezing as well, nasal congestion and drainage. Having more exertional SOB.   CT chest abdomen and pelvis done  08/31/2022 reviewed by me shows slight decrease in size of a medial right lower lobe soft tissue mass, slight increase in adjacent tiny lymph node.  There is associated occlusion of the right lower lobe bronchus.  Prominent subcarinal lymphadenopathy that is unchanged.  Subtle segment 7 dome liver lesion    Review of Systems As per HPI     Objective:   Physical Exam   Vitals:   10/19/22 1523  BP: 120/68  Pulse: 72  Temp: 97.9 F (36.6 C)  TempSrc: Oral  SpO2: 99%  Weight: 180 lb 12.8 oz (82 kg)  Height: '6\' 3"'$  (1.905 m)   Gen: Pleasant, thin, in no distress,  normal affect  ENT: Small ulceration left posterior pharynx. Mouth clear,  oropharynx clear, no postnasal drip  Neck: No JVD, no stridor  Lungs: No use of accessory muscles, no crackles or wheezes.   Cardiovascular: RRR, heart sounds normal, no murmur or gallops, no peripheral edema  Musculoskeletal: No deformities, no cyanosis or clubbing  Neuro: alert, non focal  Skin: Warm, no lesions or rash     Assessment & Plan:  COPD (chronic obstructive pulmonary disease) (HCC) We will temporarily stop your Spiriva and retry Stiolto 2 puffs once daily.  Call and let us know if you get more benefit from this medication.  If so we will send a prescription to your pharmacy and continue it going forward. Keep albuterol available to use 2 puffs if needed for shortness of breath Follow with APP in 1 month to assess your status on the new inhaler. Follow with  Dr Lamonte Sakai in 6 months or sooner if you have any problems  Chronic rhinitis Please start loratadine 10 mg (generic Claritin) once daily Please start fluticasone nasal spray, 2 sprays each nostril once daily  Chronic hypoxemic respiratory failure ([place]) We will perform walking oximetry today to confirm that you qualify for a portable oxygen concentrator.  Once we document this we will try to help get you a POC when your medical equipment company will allow  it.  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 [facility]) Continue to follow with Dr. [name] and get your chest imaging as planned  [name], MD, PhD 10/19/2022, 4:00 PM [facility] [phone] or if no answer [phone]

## 2022-10-19 NOTE — Addendum Note (Signed)
Addended by: Dierdre Highman on: 10/19/2022 04:28 PM   Modules accepted: Orders

## 2022-10-19 NOTE — Assessment & Plan Note (Signed)
We will perform walking oximetry today to confirm that you qualify for a portable oxygen concentrator.  Once we document this we will try to help get you a POC when your medical equipment company will allow it.

## 2022-10-19 NOTE — Assessment & Plan Note (Signed)
Continue to follow with Dr. [name] and get your chest imaging as planned

## 2022-10-24 ENCOUNTER — Encounter: Payer: Self-pay | Admitting: Podiatry

## 2022-10-24 ENCOUNTER — Ambulatory Visit (INDEPENDENT_AMBULATORY_CARE_PROVIDER_SITE_OTHER): Payer: Medicare Other | Admitting: Podiatry

## 2022-10-24 VITALS — BP 155/92

## 2022-10-24 DIAGNOSIS — M79674 Pain in right toe(s): Secondary | ICD-10-CM | POA: Diagnosis not present

## 2022-10-24 DIAGNOSIS — E1151 Type 2 diabetes mellitus with diabetic peripheral angiopathy without gangrene: Secondary | ICD-10-CM

## 2022-10-24 DIAGNOSIS — B351 Tinea unguium: Secondary | ICD-10-CM | POA: Diagnosis not present

## 2022-10-24 DIAGNOSIS — M79675 Pain in left toe(s): Secondary | ICD-10-CM | POA: Diagnosis not present

## 2022-10-24 DIAGNOSIS — F32A Depression, unspecified: Secondary | ICD-10-CM | POA: Insufficient documentation

## 2022-10-24 NOTE — Progress Notes (Signed)
  Subjective:  Patient ID: Richard Navarro, male    DOB: [birth year],  MRN: [id number]  Richard Navarro presents to clinic today for at risk foot care. Pt has h/o NIDDM with PAD and painful thick toenails that are difficult to trim. Pain interferes with ambulation. Aggravating factors include wearing enclosed shoe gear. Pain is relieved with periodic professional debridement.  Chief Complaint  Patient presents with   Nail Problem    [facility] BS-did not check today A1C-6.1 PCP-Hunter, [facility] Senior   New problem(s): None.   PCP is Shelva Majestic, MD.  Allergies  Allergen Reactions   Lisinopril Swelling    Angioedema- on this and afinitor same time   Simvastatin Other (See Comments)    Joint ache    Review of Systems: Negative except as noted in the HPI.  Objective: No changes noted in today's physical examination. Vitals:   10/24/22 1416  BP: (!) 155/92   Richard Navarro is a pleasant 77 y.o. male thin build in NAD. AAO x 3.  Vascular CFT <3 seconds b/l LE. Faintly palpable DP pulses b/l LE. Nonpalpable PT pulse(s) b/l LE. Pedal hair sparse. No pain with calf compression b/l. No edema noted b/l LE. No ischemia or gangrene noted b/l LE. No cyanosis or clubbing noted b/l LE.  Neurologic Normal speech. Oriented to person, place, and time. Protective sensation intact 5/5 intact bilaterally with 10g monofilament b/l.  Dermatologic Pedal skin thin and atrophic b/l LE. No open wounds b/l LE. No interdigital macerations noted b/l LE. Toenails 1-5 b/l elongated, discolored, dystrophic, thickened, crumbly with subungual debris and tenderness to dorsal palpation.  Orthopedic: Muscle strength 5/5 to all lower extremity muscle groups bilaterally. No pain, crepitus or joint limitation noted with ROM bilateral LE. Clawtoe deformity 2-5 bilaterally. Patient ambulates independent of any assistive aids.   Assessment/Plan: 1. Pain due to onychomycosis of toenails of both feet   2. Type II diabetes  mellitus with peripheral circulatory disorder [facility])     -Consent given for treatment as described below: -Examined patient. -Patient to continue soft, supportive shoe gear daily. -Mycotic toenails 1-5 bilaterally were debrided in length and girth with sterile nail nippers and dremel without incident. -For dry skin, patient was given written list of OTC moisturizers. Patient/POA instructed to apply to foot/feet once daily avoiding application between toes.  -Patient/POA to call should there be question/concern in the interim.   Return in about 3 months (around 01/24/2023).  Freddie Breech, DPM

## 2022-10-24 NOTE — Patient Instructions (Signed)
Choose a moisturizer from  the list below:  For normal skin: Moisturize feet once daily; do not apply between toes A.  CeraVe Daily Moisturizing Lotion B.  Lubriderm Advanced Therapy Lotion or Lubriderm Intense Skin Repair Lotion C.  Vaseline Intensive Care Lotion D.  Gold Bond Ultimate Diabetic Foot Lotion E.  Eucerin Intensive Repair Moisturizing Lotion  If you have problems reaching your feet: apply to feet once daily; do not apply between toes A.  Eucerin Aquaphor Ointment Body Spray  B.  Vaseline Intensive Care Spray Moisturizer (Unscented,  Cocoa Radiant Spray or Aloe Smooth Spray)

## 2022-10-30 ENCOUNTER — Telehealth: Payer: Self-pay | Admitting: Emergency Medicine

## 2022-10-30 NOTE — Telephone Encounter (Signed)
[name] friend states patient needs RX for [employer]. Pharmacy is [facility] mail order. Also checking on portable oxygen concentrator have not heard from [employer]. [name] phone number is [phone].

## 2022-10-31 MED ORDER — STIOLTO RESPIMAT 2.5-2.5 MCG/ACT IN AERS: 2.0000 | INHALATION_SPRAY | Freq: Every day | RESPIRATORY_TRACT | 11 refills | Status: DC

## 2022-10-31 NOTE — Telephone Encounter (Signed)
Spoke with [name] (per [facility]) who states pt would like to [employer]. Rx was sent to verified pharmacy. ATC [employer] but had to leave voicemail for update on POC status.

## 2022-11-06 ENCOUNTER — Other Ambulatory Visit (HOSPITAL_COMMUNITY): Payer: Self-pay

## 2022-11-08 ENCOUNTER — Inpatient Hospital Stay (HOSPITAL_BASED_OUTPATIENT_CLINIC_OR_DEPARTMENT_OTHER): Payer: Medicare Other | Admitting: Internal Medicine

## 2022-11-08 ENCOUNTER — Other Ambulatory Visit: Payer: Self-pay

## 2022-11-08 ENCOUNTER — Inpatient Hospital Stay: Payer: Medicare Other | Attending: Internal Medicine

## 2022-11-08 VITALS — BP 127/72 | HR 68 | Temp 98.2°F | Resp 17 | Wt 173.4 lb

## 2022-11-08 DIAGNOSIS — C7B02 Secondary carcinoid tumors of liver: Secondary | ICD-10-CM | POA: Insufficient documentation

## 2022-11-08 DIAGNOSIS — C7B03 Secondary carcinoid tumors of bone: Secondary | ICD-10-CM | POA: Diagnosis not present

## 2022-11-08 DIAGNOSIS — C7A8 Other malignant neuroendocrine tumors: Secondary | ICD-10-CM | POA: Diagnosis not present

## 2022-11-08 DIAGNOSIS — C7B8 Other secondary neuroendocrine tumors: Secondary | ICD-10-CM

## 2022-11-08 DIAGNOSIS — C7A090 Malignant carcinoid tumor of the bronchus and lung: Secondary | ICD-10-CM | POA: Insufficient documentation

## 2022-11-08 DIAGNOSIS — D649 Anemia, unspecified: Secondary | ICD-10-CM | POA: Diagnosis not present

## 2022-11-08 LAB — CBC WITH DIFFERENTIAL (CANCER CENTER ONLY)
Abs Immature Granulocytes: 0.05 10*3/uL (ref 0.00–0.07)
Basophils Absolute: 0.1 10*3/uL (ref 0.0–0.1)
Basophils Relative: 1 %
Eosinophils Absolute: 0 10*3/uL (ref 0.0–0.5)
Eosinophils Relative: 1 %
HCT: 33.8 % — ABNORMAL LOW (ref 39.0–52.0)
Hemoglobin: 11.7 g/dL — ABNORMAL LOW (ref 13.0–17.0)
Immature Granulocytes: 1 %
Lymphocytes Relative: 11 %
Lymphs Abs: 0.5 10*3/uL — ABNORMAL LOW (ref 0.7–4.0)
MCH: 33.7 pg (ref 26.0–34.0)
MCHC: 34.6 g/dL (ref 30.0–36.0)
MCV: 97.4 fL (ref 80.0–100.0)
Monocytes Absolute: 0.5 10*3/uL (ref 0.1–1.0)
Monocytes Relative: 10 %
Neutro Abs: 3.7 10*3/uL (ref 1.7–7.7)
Neutrophils Relative %: 76 %
Platelet Count: 197 10*3/uL (ref 150–400)
RBC: 3.47 MIL/uL — ABNORMAL LOW (ref 4.22–5.81)
RDW: 15.1 % (ref 11.5–15.5)
WBC Count: 4.8 10*3/uL (ref 4.0–10.5)
nRBC: 0.6 % — ABNORMAL HIGH (ref 0.0–0.2)

## 2022-11-08 LAB — CMP (CANCER CENTER ONLY)
ALT: 13 U/L (ref 0–44)
AST: 15 U/L (ref 15–41)
Albumin: 4 g/dL (ref 3.5–5.0)
Alkaline Phosphatase: 77 U/L (ref 38–126)
Anion gap: 9 (ref 5–15)
BUN: 20 mg/dL (ref 8–23)
CO2: 25 mmol/L (ref 22–32)
Calcium: 9 mg/dL (ref 8.9–10.3)
Chloride: 103 mmol/L (ref 98–111)
Creatinine: 1.6 mg/dL — ABNORMAL HIGH (ref 0.61–1.24)
GFR, Estimated: 44 mL/min — ABNORMAL LOW (ref >60)
Glucose, Bld: 186 mg/dL — ABNORMAL HIGH (ref 70–99)
Potassium: 3.6 mmol/L (ref 3.5–5.1)
Sodium: 137 mmol/L (ref 135–145)
Total Bilirubin: 0.5 mg/dL (ref 0.3–1.2)
Total Protein: 7.9 g/dL (ref 6.5–8.1)
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
## 2022-11-09 ENCOUNTER — Other Ambulatory Visit (HOSPITAL_COMMUNITY): Payer: Self-pay

## 2022-11-09 NOTE — Telephone Encounter (Signed)
ATC [employer] to get an update on POC status. LVMTCB.

## 2022-11-10 ENCOUNTER — Other Ambulatory Visit: Payer: Self-pay | Admitting: Family Medicine

## 2022-11-16 ENCOUNTER — Other Ambulatory Visit: Payer: Self-pay | Admitting: Family Medicine

## 2022-11-17 ENCOUNTER — Telehealth: Payer: Self-pay

## 2022-11-17 ENCOUNTER — Ambulatory Visit: Payer: Medicare Other | Admitting: Primary Care

## 2022-11-17 NOTE — Telephone Encounter (Signed)
Called [employer] and was not able to speak with anyone. I left a message for someone to call us back for an update on the POC.

## 2022-11-17 NOTE — Telephone Encounter (Signed)
Beth just an FYI, Patient has cancelled a apt today due to chills and fever. Significant other is not sure if fever is related to cancer or something different.  She plans on calling pcp Dr. Yong Channel to see if patient needs to be seen.  She plans to call back and re-schedule f/u when patient feels better

## 2022-11-20 ENCOUNTER — Telehealth: Payer: Self-pay | Admitting: Family Medicine

## 2022-11-20 NOTE — Telephone Encounter (Signed)
Advised to see provider within 4 hrs. Will call to schedule ov.  Patient Name: Richard Navarro Gender: Male DOB: 1946/05/17 Age: 77 Y 73 M 14 D Return Phone Number: [phone] (Primary), [phone] (Secondary) Address: City/ State/ Zip: [place]  [address] Client [place] [facility] Client Site [place] [facility] Night Provider [name]- MD Contact Type Call Who Is Calling Patient / Member / Family / Caregiver Call Type Triage / Clinical Caller Name [name] Relationship To Patient Partner Return Phone Number [phone] (Primary) Chief Complaint FEVER - any fever in cancer, chemotherapy, oncology, sickle cell or HIV patient. Reason for Call Symptomatic / Request for [place] states she has a pt of Dr. 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 Hx of sepsis had it 3 or 4 times and had pneumonia. He developed a fever yesterday and tylenol brought the fever down. She called 911 and they came but they didn't see anything terrible alarming. This morning he is refusing to be seen. His temp keeps going up when he doesn't get the tylenol. This morning his temp was 102.4. He did not have his oxygen on this morning and it says his oxygen is 80 when she put it back on it went to 91. Translation No Nurse Assessment Nurse: Ronnald Ramp, RN, Miranda Date/Time (Eastern Time): 11/17/2022 1:43:13 PM Confirm and document reason for call. If symptomatic, describe symptoms. ---Caller states the patient started running a fever yesterday. She called 911 yesterday but they did not find anything that needed treatment in the ER. They recommended that he follow up with someone today. The patient agreed to be seen. Today his temp has gone up to 102.4, down to 98.4. He wears O2, but he took it off. She noticed his SpO2 was 80% but came back up  to 93% when she put the O2 back on. Yesterday, he was c/o back pain, decreased urination, dark urine. He is under treatment for lung cancer. Does the patient have any new or worsening symptoms? ---Yes Will a triage be completed? ---Yes Related visit to physician within the last 2 weeks? ---Yes Nurse Assessment Does the PT have any chronic conditions? (i.e. diabetes, asthma, this includes High risk factors for pregnancy, etc.) ---Yes List chronic conditions. ---Lung Cancer, Kidney disease, Diabetes, HTN, Gout, GERD Is this a behavioral health or substance abuse call? ---No Guidelines Guideline Title Affirmed Question Affirmed Notes Nurse Date/Time Eilene Ghazi Time) Flank Pain Fever > 100.4 F (38.0 C) Jones, RN, Miranda 11/17/2022 1:52:30 PM Disp. Time Eilene Ghazi Time) Disposition Final User 11/17/2022 1:42:39 PM Send to Urgent Queue Socrates, Janett Billow 11/17/2022 2:01:24 PM See HCP within 4 Hours (or PCP triage) Yes Ronnald Ramp, RN, Miranda Final Disposition 11/17/2022 2:01:24 PM See HCP within 4 Hours (or PCP triage) Yes Ronnald Ramp, RN, Miranda Caller Disagree/Comply Comply Caller Understands Yes PreDisposition Did not know what to do Care Advice Given Per Guideline SEE HCP (OR PCP TRIAGE) WITHIN 4 HOURS: * IF OFFICE WILL BE OPEN: You need to be seen within the next 3 or 4 hours. Call your doctor (or NP/PA) now or as soon as the office opens. CALL BACK IF: * You become worse CARE ADVICE given per Flank Pain (Adult) guideline. Comments User: Leverne Humbles, RN Date/Time Eilene Ghazi Time): 11/17/2022 2:02:57 PM Patient refuses to go anywhere to be seen Referrals GO TO FACILITY REFUSED

## 2022-11-20 NOTE — Telephone Encounter (Signed)
Pt has been scheduled for 04/04.

## 2022-11-22 ENCOUNTER — Emergency Department (HOSPITAL_COMMUNITY)
Admission: EM | Admit: 2022-11-22 | Discharge: 2022-11-22 | Disposition: A | Discharge status: Home / Self Care | Payer: Medicare Other | Attending: Emergency Medicine | Admitting: Emergency Medicine

## 2022-11-22 ENCOUNTER — Other Ambulatory Visit: Payer: Self-pay

## 2022-11-22 ENCOUNTER — Ambulatory Visit: Payer: Medicare Other | Admitting: Physician Assistant

## 2022-11-22 ENCOUNTER — Encounter (HOSPITAL_COMMUNITY): Payer: Self-pay

## 2022-11-22 ENCOUNTER — Emergency Department (HOSPITAL_COMMUNITY): Payer: Medicare Other

## 2022-11-22 DIAGNOSIS — R0602 Shortness of breath: Secondary | ICD-10-CM

## 2022-11-22 DIAGNOSIS — J40 Bronchitis, not specified as acute or chronic: Secondary | ICD-10-CM | POA: Diagnosis not present

## 2022-11-22 DIAGNOSIS — Z1152 Encounter for screening for COVID-19: Secondary | ICD-10-CM | POA: Diagnosis not present

## 2022-11-22 DIAGNOSIS — Z79899 Other long term (current) drug therapy: Secondary | ICD-10-CM | POA: Diagnosis not present

## 2022-11-22 DIAGNOSIS — Z85118 Personal history of other malignant neoplasm of bronchus and lung: Secondary | ICD-10-CM | POA: Insufficient documentation

## 2022-11-22 DIAGNOSIS — I10 Essential (primary) hypertension: Secondary | ICD-10-CM | POA: Diagnosis not present

## 2022-11-22 DIAGNOSIS — R509 Fever, unspecified: Secondary | ICD-10-CM | POA: Diagnosis not present

## 2022-11-22 DIAGNOSIS — J90 Pleural effusion, not elsewhere classified: Secondary | ICD-10-CM | POA: Diagnosis not present

## 2022-11-22 LAB — CBC WITH DIFFERENTIAL/PLATELET
Abs Immature Granulocytes: 0.02 10*3/uL (ref 0.00–0.07)
Basophils Absolute: 0 10*3/uL (ref 0.0–0.1)
Basophils Relative: 1 %
Eosinophils Absolute: 0 10*3/uL (ref 0.0–0.5)
Eosinophils Relative: 1 %
HCT: 35.1 % — ABNORMAL LOW (ref 39.0–52.0)
Hemoglobin: 11.8 g/dL — ABNORMAL LOW (ref 13.0–17.0)
Immature Granulocytes: 0 %
Lymphocytes Relative: 11 %
Lymphs Abs: 0.7 10*3/uL (ref 0.7–4.0)
MCH: 33.5 pg (ref 26.0–34.0)
MCHC: 33.6 g/dL (ref 30.0–36.0)
MCV: 99.7 fL (ref 80.0–100.0)
Monocytes Absolute: 0.6 10*3/uL (ref 0.1–1.0)
Monocytes Relative: 9 %
Neutro Abs: 5.2 10*3/uL (ref 1.7–7.7)
Neutrophils Relative %: 78 %
Platelets: 188 10*3/uL (ref 150–400)
RBC: 3.52 MIL/uL — ABNORMAL LOW (ref 4.22–5.81)
RDW: 15.5 % (ref 11.5–15.5)
WBC: 6.6 10*3/uL (ref 4.0–10.5)
nRBC: 0 % (ref 0.0–0.2)

## 2022-11-22 LAB — COMPREHENSIVE METABOLIC PANEL
ALT: 20 U/L (ref 0–44)
AST: 16 U/L (ref 15–41)
Albumin: 3.9 g/dL (ref 3.5–5.0)
Alkaline Phosphatase: 61 U/L (ref 38–126)
Anion gap: 8 (ref 5–15)
BUN: 20 mg/dL (ref 8–23)
CO2: 23 mmol/L (ref 22–32)
Calcium: 8.9 mg/dL (ref 8.9–10.3)
Chloride: 103 mmol/L (ref 98–111)
Creatinine, Ser: 1.47 mg/dL — ABNORMAL HIGH (ref 0.61–1.24)
GFR, Estimated: 49 mL/min — ABNORMAL LOW (ref >60)
Glucose, Bld: 145 mg/dL — ABNORMAL HIGH (ref 70–99)
Potassium: 3.3 mmol/L — ABNORMAL LOW (ref 3.5–5.1)
Sodium: 134 mmol/L — ABNORMAL LOW (ref 135–145)
Total Bilirubin: 0.6 mg/dL (ref 0.3–1.2)
Total Protein: 8.3 g/dL — ABNORMAL HIGH (ref 6.5–8.1)

## 2022-11-22 LAB — URINALYSIS, ROUTINE W REFLEX MICROSCOPIC
Bilirubin Urine: NEGATIVE
Glucose, UA: NEGATIVE mg/dL
Hgb urine dipstick: NEGATIVE
Ketones, ur: NEGATIVE mg/dL
Leukocytes,Ua: NEGATIVE
Nitrite: NEGATIVE
Protein, ur: NEGATIVE mg/dL
Specific Gravity, Urine: 1.015 (ref 1.005–1.030)
pH: 5 (ref 5.0–8.0)

## 2022-11-22 LAB — LACTIC ACID, PLASMA: Lactic Acid, Venous: 0.9 mmol/L (ref 0.5–1.9)

## 2022-11-22 LAB — SARS CORONAVIRUS 2 BY RT PCR: SARS Coronavirus 2 by RT PCR: NEGATIVE

## 2022-11-22 LAB — RESP PANEL BY RT-PCR (RSV, FLU A&B, COVID)  RVPGX2
Influenza A by PCR: NEGATIVE
Influenza B by PCR: NEGATIVE
Resp Syncytial Virus by PCR: NEGATIVE
SARS Coronavirus 2 by RT PCR: NEGATIVE

## 2022-11-22 MED ORDER — DOXYCYCLINE HYCLATE 100 MG PO CAPS: 100.0000 mg | ORAL_CAPSULE | Freq: Two times a day (BID) | ORAL | 0 refills | Status: AC

## 2022-11-22 MED ORDER — CEFPODOXIME PROXETIL 200 MG PO TABS: 200.0000 mg | ORAL_TABLET | Freq: Two times a day (BID) | ORAL | 0 refills | Status: DC

## 2022-11-22 NOTE — ED Triage Notes (Signed)
Patient is here for evaluation of having fever and chills since Friday. Reports that his highest fever was 101.6, but mainly has been running low grade fevers around 99. Reports taking 1 tylenol previously, that broke his fever. Patient also reports non-productive cough for the past few days. Patient also reports today had more shortness of breath than usual. Has extensive medical issues including cancer, which is taking a pill for.

## 2022-11-22 NOTE — ED Provider Notes (Signed)
EMERGENCY DEPARTMENT AT [facility] Provider Note   CSN: [id number] Arrival date & time: 11/22/22  1119     History  Chief Complaint  Patient presents with   Fever   Shortness of Breath    Richard Navarro is a 77 y.o. male.  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  History obtained per patient and his ex-wife.  Over the past week has been having fevers at home up to 101.50F.  Also has had congestion and productive cough.  Says that this morning had some shortness of breath as well so he decided to come into the emergency department for evaluation.  Last temperature was 100.2 F this morning and has been taking Tylenol with last dose at 8:30 AM today.  Denies any chest pain, nausea, vomiting, diarrhea.  No dysuria or frequency.  Last chemotherapy treatment was 2 weeks ago.       Home Medications Prior to Admission medications   Medication Sig Start Date End Date Taking? Authorizing Provider  cefpodoxime (VANTIN) 200 MG tablet Take 1 tablet (200 mg total) by mouth 2 (two) times daily. 11/22/22  Yes Fransico Meadow, MD  doxycycline (VIBRAMYCIN) 100 MG capsule Take 1 capsule (100 mg total) by mouth 2 (two) times daily for 5 days. 11/22/22 11/27/22 Yes Fransico Meadow, MD  albuterol (VENTOLIN HFA) 108 (90 Base) MCG/ACT inhaler Inhale 2 puffs into the lungs every 4 (four) hours as needed for wheezing or shortness of breath. 12/07/20   Collene Gobble, MD  allopurinol (ZYLOPRIM) 100 MG tablet TAKE 1 TABLET DAILY 11/10/22   [name], MD  amLODipine (NORVASC) 10 MG tablet TAKE 1 TABLET DAILY 08/29/22   [name], MD  Calcium Carbonate Antacid (TUMS ULTRA 1000 PO) Take 2,000 mg by mouth in the morning and at bedtime.    [provider]  capecitabine (XELODA) 500 MG tablet TAKE 3 TABLETS BY MOUTH TWICE  DAILY FOR 14 DAYS EVERY 4 WEEKS. 09/12/22 09/12/23  Curt Bears, MD  Cholecalciferol (VITAMIN D3) 25 MCG (1000 UT) CAPS Take 1,000 Units by mouth daily.    [provider]  Continuous Blood Gluc Receiver ([place]) [place] Please provide 1 receiver 05/12/22   [name], MD  Continuous Blood Gluc Sensor (DEXCOM G7 SENSOR) MISC 1 Box by Does not apply route every 30 (thirty) days. 05/12/22   [name], MD  COVID-19 mRNA vaccine [id number] (COMIRNATY) SUSP injection Inject into the muscle. 06/01/22   Carlyle Basques, MD  docusate sodium (COLACE) 100 MG capsule Take 300 mg by mouth daily.    [provider]  Ferrous Sulfate (IRON) 325 (65 Fe) MG TABS Take 325 mg by mouth every other day.    [provider]  glipiZIDE (GLUCOTROL) 5 MG tablet TAKE 1 TABLET DAILY BEFORE BREAKFAST 12/02/21   [name], MD  glucose blood test strip Use to test blood sugar twice a day 05/12/22   [name], MD  guaiFENesin (MUCINEX) 600 MG 12 hr tablet Take 600 mg by mouth 2 (two) times daily.    [provider]  ketotifen (ZADITOR) 0.025 % ophthalmic solution Place 1 drop into both eyes daily as needed (allergies).    [provider]  magnesium hydroxide (MILK OF MAGNESIA) 400 MG/5ML suspension Take 30 mLs by mouth daily as needed (constipation).  [provider]  metoprolol tartrate (LOPRESSOR) 25 MG tablet TAKE 1 TABLET TWICE A DAY 11/16/22   [name], MD  ondansetron (ZOFRAN) 8 MG tablet TAKE 1 TABLET BY MOUTH EVERY 8 HOURS AS NEEDED FOR NAUSEA AND/OR VOMITING 09/12/22 09/12/23  Curt Bears, MD  OXYGEN Inhale 2 L into the lungs as needed (SOB).    [provider]  pantoprazole (PROTONIX) 40 MG tablet Take 1 tablet (40 mg total) by mouth 2 (two) times daily. 01/09/22   [name], MD  polyethylene glycol (MIRALAX / GLYCOLAX) 17 g packet Take 17 g by mouth daily.    [provider]  SPIRIVA RESPIMAT  2.5 MCG/ACT AERS USE 2 INHALATIONS DAILY 10/19/22   Collene Gobble, MD  temozolomide (TEMODAR) 100 MG capsule Take 4 capsules by mouth daily on day 10, 11, 12, 13, and 14 every 4 weeks. Take on an empty stomach or at bedtime to decrease nausea or vomiting 09/12/22 09/12/23  Curt Bears, MD  Tiotropium Bromide-Olodaterol (STIOLTO RESPIMAT) 2.5-2.5 MCG/ACT AERS Inhale 2 puffs into the lungs daily. 10/31/22   Collene Gobble, MD  vitamin B-12 (CYANOCOBALAMIN) 1000 MCG tablet Take 1,000 mcg by mouth daily.     [provider]  vitamin C (ASCORBIC ACID) 500 MG tablet Take 500 mg by mouth daily.    [provider]      Allergies    Lisinopril and Simvastatin    Review of Systems   Review of Systems  Physical Exam Updated Vital Signs BP (!) 151/78   Pulse 92   Temp 98.7 F (37.1 C)   Resp 18   Ht 6\' 3"  (1.905 m)   Wt 77.1 kg   SpO2 92%   BMI 21.25 kg/m  Physical Exam Vitals and nursing note reviewed.  Constitutional:      General: He is not in acute distress.    Appearance: He is well-developed.  HENT:     Head: Normocephalic and atraumatic.     Right Ear: External ear normal.     Left Ear: External ear normal.     Nose: Congestion present.  Eyes:     Extraocular Movements: Extraocular movements intact.     Conjunctiva/sclera: Conjunctivae normal.     Pupils: Pupils are equal, round, and reactive to light.  Cardiovascular:     Rate and Rhythm: Normal rate and regular rhythm.     Heart sounds: Normal heart sounds.  Pulmonary:     Effort: Pulmonary effort is normal. No respiratory distress.     Breath sounds: Normal breath sounds.  Abdominal:     General: There is no distension.     Palpations: Abdomen is soft. There is no mass.     Tenderness: There is no abdominal tenderness. There is no guarding.  Musculoskeletal:     Cervical back: Normal range of motion and neck supple.     Right lower leg: No edema.     Left lower leg: No edema.  Skin:     General: Skin is warm and dry.  Neurological:     Mental Status: He is alert. Mental status is at baseline.  Psychiatric:        Mood and Affect: Mood normal.        Behavior: Behavior normal.     ED Results / Procedures / Treatments   Labs (all labs ordered are listed, but only abnormal results are displayed) Labs Reviewed  CBC WITH DIFFERENTIAL/PLATELET - Abnormal; Notable for the  following components:      Result Value   RBC 3.52 (*)    Hemoglobin 11.8 (*)    HCT 35.1 (*)    All other components within normal limits  COMPREHENSIVE METABOLIC PANEL - Abnormal; Notable for the following components:   Sodium 134 (*)    Potassium 3.3 (*)    Glucose, Bld 145 (*)    Creatinine, Ser 1.47 (*)    Total Protein 8.3 (*)    GFR, Estimated 49 (*)    All other components within normal limits  SARS CORONAVIRUS 2 BY RT PCR  RESP PANEL BY RT-PCR (RSV, FLU A&B, COVID)  [id number]  LACTIC ACID, PLASMA  URINALYSIS, ROUTINE W REFLEX MICROSCOPIC    EKG None  Radiology DG Chest 2 View  Result Date: 11/22/2022 CLINICAL DATA:  Shortness of breath. Fevers and chills since Friday. EXAM: CHEST - 2 VIEW COMPARISON:  X-ray 04/25/2022.  CT scan chest 08/31/2022 FINDINGS: Tiny right pleural effusion. Mild right lung base opacity. Masslike area medially along the right lung base is present but less well-defined on this x-ray rather than CT. Left lung is clear. No pneumothorax or edema. Normal cardiopericardial silhouette. IMPRESSION: Tiny right pleural effusion with mild adjacent opacity. Nodular areas seen on prior CT scan of the right medial lung base is not well defined on this x-ray exam compared to the prior CT Electronically Signed   By: Jill Side M.D.   On: 11/22/2022 12:43    Procedures Procedures   Medications Ordered in ED Medications - No data to display  ED Course/ Medical Decision Making/ A&P Clinical Course as of 11/22/22 1734  Wed Nov 22, 2022  1542 Creatinine(!): 1.47 At baseline  [RP]    Clinical Course User Index [RP] Fransico Meadow, MD                            Medical Decision Making Amount and/or Complexity of Data Reviewed Labs:  Decision-making details documented in ED Course.  Risk Prescription drug management.   Richard Navarro is a 77 y.o. 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    Initial Ddx:  URI, pneumonia, urinary tract infection, PE, COPD  MDM:  The patient likely has a URI or pneumonia based on his symptoms.  Is nonseptic appearing on exam today.  Will also test for urinary tract infection since it is common cause of infections but feel this is less likely.  Did consider PE but without chest pain or hypoxia at rest feel this is less likely.  No wheezing to suggest COPD exacerbation.  Plan:  Labs COVID and flu Chest x-ray Urinalysis  ED Summary/Re-evaluation:  Patient underwent the above workup.  Did not reveal source of infection.  Since he is immunocompromised we will go ahead and treat him at this time for possible community-acquired pneumonia.  Did have a walking pulse ox where his oxygen saturations on room air remained in the 90s but did dip down transiently into the high 80s.  Talk to him about this and he says that this is typical for him even when he is not sick and he is on 2 L home oxygen for this.  He does not have any oxygen requirements and lab work and evaluation has been reassuring we will have him follow-up with his primary doctor  in 2 to 3 days.  This patient presents to the ED for concern of complaints listed in HPI, this involves an extensive number of treatment options, and is a complaint that carries with it a high risk of complications and morbidity. Disposition including potential need for admission considered.   Dispo: DC Home.  Return precautions discussed including, but not limited to, those listed in the AVS. Allowed pt time to ask questions which were answered fully prior to dc.  Additional history obtained from significant other Records reviewed Outpatient Clinic Notes The following labs were independently interpreted: Chemistry and show no acute abnormality I independently reviewed the following imaging with scope of interpretation limited to determining acute life threatening conditions related to emergency care: Chest x-ray and agree with the radiologist interpretation with the following exceptions: none I personally reviewed and interpreted cardiac monitoring: normal sinus rhythm  I personally reviewed and interpreted the pt's EKG: see above for interpretation  I have reviewed the patients home medications and made adjustments as needed Social Determinants of health:  Elderly  Final Clinical Impression(s) / ED Diagnoses Final diagnoses:  Shortness of breath  Bronchitis    Rx / DC Orders ED Discharge Orders          Ordered    cefpodoxime (VANTIN) 200 MG tablet  2 times daily        11/22/22 1730    doxycycline (VIBRAMYCIN) 100 MG capsule  2 times daily        11/22/22 1730              Fransico Meadow, MD 11/22/22 1734

## 2022-11-22 NOTE — ED Notes (Addendum)
Ambulated pt with SpO2, pt did well initially, however during the walk pt did start to feel slightly unbalanced during the walk. Pts SpO2 stayed between 100-96%. Only towards the end of the walk pt was slightly SHOB and SpO2 dropped to 87%. Pt was not ambulated with oxygen.

## 2022-11-22 NOTE — Discharge Instructions (Signed)
You were seen for shortness of breath in the emergency department.   At home, please take the antibiotics we have prescribed you in case you are developing a pneumonia.    Follow-up with your primary doctor in 2-3 days regarding your visit.    Return immediately to the emergency department if you experience any of the following: Worsening shortness of breath, chest pain, fainting, or any other concerning symptoms.    Thank you for visiting our Emergency Department. It was a pleasure taking care of you today.

## 2022-11-22 NOTE — ED Provider Triage Note (Signed)
Emergency Medicine Provider Triage Evaluation Note  Richard Navarro , a 77 y.o. male  was evaluated in triage.  Pt complains of intermittent fevers since last Thursday, Friday, Tmax 101.4 at home, reports that he had a fever 100.1 just this morning.  He reports that he has been having some more cough than normal, feeling short of breath despite being on oxygen.  Patient with metastatic lung cancer.  He denies any dysuria, abdominal pain, chest pain.  He denies any sick contacts.  He did not want to come to the hospital. Taking immunotherapy, last dose 2 weeks ago.  Review of Systems  Positive: Cough, fever Negative: Chest pain, abd pain, dysuria  Physical Exam  BP (!) 145/76 (BP Location: Left Arm)   Pulse 92   Temp 98.4 F (36.9 C) (Oral)   Resp 18   Ht 6\' 3"  (1.905 m)   Wt 77.1 kg   BMI 21.25 kg/m  Gen:   Awake, no distress   Resp:  Normal effort, no wheezing, rhonchi, rales MSK:   Moves extremities without difficulty  Other:    Medical Decision Making  Medically screening exam initiated at 12:16 PM.  Appropriate orders placed.  Richard Navarro was informed that the remainder of the evaluation will be completed by another provider, this initial triage assessment does not replace that evaluation, and the importance of remaining in the ED until their evaluation is complete.  Workup initiated in triage    Anselmo Pickler, [place] 11/22/22 1218

## 2022-11-23 ENCOUNTER — Ambulatory Visit (INDEPENDENT_AMBULATORY_CARE_PROVIDER_SITE_OTHER): Payer: Medicare Other

## 2022-11-23 VITALS — Wt 170.0 lb

## 2022-11-23 DIAGNOSIS — Z0000 Encounter for general adult medical examination without abnormal findings: Secondary | ICD-10-CM | POA: Diagnosis not present

## 2022-11-23 NOTE — Telephone Encounter (Signed)
Patient's significant other [name]) called to inform the nurse that the patient still has not heard from [facility] regarding the [place].  Please call to give an update.  CB# [phone]

## 2022-11-23 NOTE — Patient Instructions (Signed)
Richard Navarro , Thank you for taking time to come for your Medicare Wellness Visit. I appreciate your ongoing commitment to your health goals. Please review the following plan we discussed and let me know if I can assist you in the future.   These are the goals we discussed:  Goals      patient     Continue to learn and enjoy people      Patient Stated     Reduce your stress;      Patient Stated     Stay alive      Patient Stated     None at this time      Patient Stated     Stay alive and active         This is a list of the screening recommended for you and due dates:  Health Maintenance  Topic Date Due   Yearly kidney health urinalysis for diabetes  Never done   COVID-19 Vaccine (7 - 2023-24 season) 07/27/2022   Eye exam for diabetics  10/20/2022   Zoster (Shingles) Vaccine (1 of 2) 12/11/2022*   Complete foot exam   01/10/2023   Flu Shot  03/22/2023   Hemoglobin A1C  03/23/2023   Yearly kidney function blood test for diabetes  11/22/2023   Medicare Annual Wellness Visit  11/23/2023   Pneumonia Vaccine  Completed   Hepatitis C Screening: USPSTF Recommendation to screen - Ages 18-79 yo.  Completed   HPV Vaccine  Aged Out   DTaP/Tdap/Td vaccine  Discontinued   Colon Cancer Screening  Discontinued  *Topic was postponed. The date shown is not the original due date.    Advanced directives: Advance directive discussed with you today. Even though you declined this today please call our office should you change your mind and we can give you the proper paperwork for you to fill out.  Conditions/risks identified: stay alive and active   Next appointment: Follow up in one year for your annual wellness visit.   Preventive Care 43 Years and Older, Male  Preventive care refers to lifestyle choices and visits with your health care provider that can promote health and wellness. What does preventive care include? A yearly physical exam. This is also called an annual well  check. Dental exams once or twice a year. Routine eye exams. Ask your health care provider how often you should have your eyes checked. Personal lifestyle choices, including: Daily care of your teeth and gums. Regular physical activity. Eating a healthy diet. Avoiding tobacco and drug use. Limiting alcohol use. Practicing safe sex. Taking low doses of aspirin every day. Taking vitamin and mineral supplements as recommended by your health care provider. What happens during an annual well check? The services and screenings done by your health care provider during your annual well check will depend on your age, overall health, lifestyle risk factors, and family history of disease. Counseling  Your health care provider may ask you questions about your: Alcohol use. Tobacco use. Drug use. Emotional well-being. Home and relationship well-being. Sexual activity. Eating habits. History of falls. Memory and ability to understand (cognition). Work and work Statistician. Screening  You may have the following tests or measurements: Height, weight, and BMI. Blood pressure. Lipid and cholesterol levels. These may be checked every 5 years, or more frequently if you are over 27 years old. Skin check. Lung cancer screening. You may have this screening every year starting at age 77 if you have a 30-pack-year history of  smoking and currently smoke or have quit within the past 15 years. Fecal occult blood test (FOBT) of the stool. You may have this test every year starting at age 75. Flexible sigmoidoscopy or colonoscopy. You may have a sigmoidoscopy every 5 years or a colonoscopy every 10 years starting at age 52. Prostate cancer screening. Recommendations will vary depending on your family history and other risks. Hepatitis C blood test. Hepatitis B blood test. Sexually transmitted disease (STD) testing. Diabetes screening. This is done by checking your blood sugar (glucose) after you have not  eaten for a while (fasting). You may have this done every 1-3 years. Abdominal aortic aneurysm (AAA) screening. You may need this if you are a current or former smoker. Osteoporosis. You may be screened starting at age 43 if you are at high risk. Talk with your health care provider about your test results, treatment options, and if necessary, the need for more tests. Vaccines  Your health care provider may recommend certain vaccines, such as: Influenza vaccine. This is recommended every year. Tetanus, diphtheria, and acellular pertussis (Tdap, Td) vaccine. You may need a Td booster every 10 years. Zoster vaccine. You may need this after age 68. Pneumococcal 13-valent conjugate (PCV13) vaccine. One dose is recommended after age 27. Pneumococcal polysaccharide (PPSV23) vaccine. One dose is recommended after age 91. Talk to your health care provider about which screenings and vaccines you need and how often you need them. This information is not intended to replace advice given to you by your health care provider. Make sure you discuss any questions you have with your health care provider. Document Released: 09/03/2015 Document Revised: 04/26/2016 Document Reviewed: 06/08/2015 Elsevier Interactive Patient Education  2017 [employer] Prevention in the Home Falls can cause injuries. They can happen to people of all ages. There are many things you can do to make your home safe and to help prevent falls. What can I do on the outside of my home? Regularly fix the edges of walkways and driveways and fix any cracks. Remove anything that might make you trip as you walk through a door, such as a raised step or threshold. Trim any bushes or trees on the path to your home. Use bright outdoor lighting. Clear any walking paths of anything that might make someone trip, such as rocks or tools. Regularly check to see if handrails are loose or broken. Make sure that both sides of any steps have  handrails. Any raised decks and porches should have guardrails on the edges. Have any leaves, snow, or ice cleared regularly. Use sand or salt on walking paths during winter. Clean up any spills in your garage right away. This includes oil or grease spills. What can I do in the bathroom? Use night lights. Install grab bars by the toilet and in the tub and shower. Do not use towel bars as grab bars. Use non-skid mats or decals in the tub or shower. If you need to sit down in the shower, use a plastic, non-slip stool. Keep the floor dry. Clean up any water that spills on the floor as soon as it happens. Remove soap buildup in the tub or shower regularly. Attach bath mats securely with double-sided non-slip rug tape. Do not have throw rugs and other things on the floor that can make you trip. What can I do in the bedroom? Use night lights. Make sure that you have a light by your bed that is easy to reach. Do not use  any sheets or blankets that are too big for your bed. They should not hang down onto the floor. Have a firm chair that has side arms. You can use this for support while you get dressed. Do not have throw rugs and other things on the floor that can make you trip. What can I do in the kitchen? Clean up any spills right away. Avoid walking on wet floors. Keep items that you use a lot in easy-to-reach places. If you need to reach something above you, use a strong step stool that has a grab bar. Keep electrical cords out of the way. Do not use floor polish or wax that makes floors slippery. If you must use wax, use non-skid floor wax. Do not have throw rugs and other things on the floor that can make you trip. What can I do with my stairs? Do not leave any items on the stairs. Make sure that there are handrails on both sides of the stairs and use them. Fix handrails that are broken or loose. Make sure that handrails are as long as the stairways. Check any carpeting to make sure  that it is firmly attached to the stairs. Fix any carpet that is loose or worn. Avoid having throw rugs at the top or bottom of the stairs. If you do have throw rugs, attach them to the floor with carpet tape. Make sure that you have a light switch at the top of the stairs and the bottom of the stairs. If you do not have them, ask someone to add them for you. What else can I do to help prevent falls? Wear shoes that: Do not have high heels. Have rubber bottoms. Are comfortable and fit you well. Are closed at the toe. Do not wear sandals. If you use a stepladder: Make sure that it is fully opened. Do not climb a closed stepladder. Make sure that both sides of the stepladder are locked into place. Ask someone to hold it for you, if possible. Clearly mark and make sure that you can see: Any grab bars or handrails. First and last steps. Where the edge of each step is. Use tools that help you move around (mobility aids) if they are needed. These include: Canes. Walkers. Scooters. Crutches. Turn on the lights when you go into a dark area. Replace any light bulbs as soon as they burn out. Set up your furniture so you have a clear path. Avoid moving your furniture around. If any of your floors are uneven, fix them. If there are any pets around you, be aware of where they are. Review your medicines with your doctor. Some medicines can make you feel dizzy. This can increase your chance of falling. Ask your doctor what other things that you can do to help prevent falls. This information is not intended to replace advice given to you by your health care provider. Make sure you discuss any questions you have with your health care provider. Document Released: 06/03/2009 Document Revised: 01/13/2016 Document Reviewed: 09/11/2014 Elsevier Interactive Patient Education  2017 [employer].
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
## 2022-11-27 ENCOUNTER — Encounter: Payer: Self-pay | Admitting: Family Medicine

## 2022-11-27 ENCOUNTER — Ambulatory Visit (INDEPENDENT_AMBULATORY_CARE_PROVIDER_SITE_OTHER): Payer: Medicare Other | Admitting: Family Medicine

## 2022-11-27 ENCOUNTER — Telehealth: Payer: Self-pay

## 2022-11-27 ENCOUNTER — Other Ambulatory Visit: Payer: Self-pay | Admitting: Family Medicine

## 2022-11-27 VITALS — BP 136/72 | HR 70 | Temp 97.6°F | Ht 75.0 in | Wt 172.0 lb

## 2022-11-27 DIAGNOSIS — I10 Essential (primary) hypertension: Secondary | ICD-10-CM | POA: Diagnosis not present

## 2022-11-27 DIAGNOSIS — E1122 Type 2 diabetes mellitus with diabetic chronic kidney disease: Secondary | ICD-10-CM | POA: Diagnosis not present

## 2022-11-27 DIAGNOSIS — J988 Other specified respiratory disorders: Secondary | ICD-10-CM

## 2022-11-27 DIAGNOSIS — N1832 Chronic kidney disease, stage 3b: Secondary | ICD-10-CM | POA: Diagnosis not present

## 2022-11-27 DIAGNOSIS — C7A8 Other malignant neuroendocrine tumors: Secondary | ICD-10-CM | POA: Diagnosis not present

## 2022-11-27 NOTE — Progress Notes (Signed)
Phone [phone] In person visit   Subjective:   Richard Navarro is a 77 y.o. year old very pleasant male patient who presents for/with See problem oriented charting Chief Complaint  Patient presents with   Follow-up    Pt is here for ER f/u due to SOB.    Past Medical History-  Patient Active Problem List   Diagnosis Date Noted   Chronic hypoxemic respiratory failure 08/04/2021    Priority: High   Anemia 05/07/2018    Priority: High   Chronic kidney disease (CKD) stage G3b/A1, moderately decreased glomerular filtration rate (GFR) between 30-44 mL/min/1.73 square meter and albuminuria creatinine ratio less than 30 mg/g (HCC) 03/04/2018    Priority: High   Neuroendocrine carcinoma 08/09/2017    Priority: High   Primary cancer of right lung 12/22/2016    Priority: High   Diabetes mellitus with renal manifestation     Priority: High   Myalgia due to statin 03/04/2018    Priority: Medium    COPD (chronic obstructive pulmonary disease) 12/01/2016    Priority: Medium    PTSD (post-traumatic stress disorder) 12/07/2015    Priority: Medium    Erectile dysfunction 04/09/2014    Priority: Medium    Former smoker 03/26/2014    Priority: Medium    Essential hypertension     Priority: Medium    Gout     Priority: Medium    Depression     Priority: Medium    Hyperlipidemia     Priority: Medium    Thrombocytopenia     Priority: Low   Pneumonia 08/09/2017    Priority: Low   Encounter for antineoplastic chemotherapy 01/10/2017    Priority: Low   Goals of care, counseling/discussion 01/10/2017    Priority: Low   Prostate cancer screening 04/17/2014    Priority: Low   Arthritis 03/26/2014    Priority: Low   History of adenomatous polyp of colon 03/26/2014    Priority: Low   GERD (gastroesophageal reflux disease)     Priority: Low   Depressive disorder 10/24/2022   Chronic rhinitis 10/19/2022   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 09/09/2021   Dysphagia     Encounter for colorectal cancer screening    Prolonged QT interval 04/17/2021   Sepsis 04/16/2021   Alcohol abuse 10/26/2020   Benign neoplasm of colon 10/26/2020   Carpal tunnel syndrome 10/26/2020   Family history of malignant neoplasm of gastrointestinal tract 10/26/2020   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 3 each 2   allopurinol (ZYLOPRIM) 100 MG tablet TAKE 1 TABLET DAILY 90 tablet 3   amLODipine (NORVASC) 10 MG tablet TAKE 1 TABLET DAILY 90 tablet 3   Calcium Carbonate Antacid (TUMS ULTRA 1000 PO) Take 2,000 mg by mouth in the morning and at bedtime.     capecitabine (XELODA) 500 MG tablet TAKE 3 TABLETS BY MOUTH TWICE DAILY FOR 14 DAYS EVERY 4 WEEKS. 84 tablet 2   cefpodoxime (VANTIN) 200 MG tablet Take 1 tablet (  200 mg total) by mouth 2 (two) times daily. 5 tablet 0   Cholecalciferol (VITAMIN D3) 25 MCG (1000 UT) CAPS Take 1,000 Units by mouth daily.     Continuous Blood Gluc Receiver (DEXCOM G7 RECEIVER) DEVI Please provide 1 receiver 1 each 0   Continuous Blood Gluc Sensor (DEXCOM G7 SENSOR) MISC 1 Box by Does not apply route every 30 (thirty) days. 3 each 3   docusate sodium (COLACE) 100 MG capsule Take 300 mg by mouth daily.     doxycycline (VIBRAMYCIN) 100 MG capsule Take 1 capsule (100 mg total) by mouth 2 (two) times daily for 5 days. 10 capsule 0   Ferrous Sulfate (IRON) 325 (65 Fe) MG TABS Take 325 mg by mouth every other day.     glucose blood  test strip Use to test blood sugar twice a day 200 each 3   ketotifen (ZADITOR) 0.025 % ophthalmic solution Place 1 drop into both eyes daily as needed (allergies).     magnesium hydroxide (MILK OF MAGNESIA) 400 MG/5ML suspension Take 30 mLs by mouth daily as needed (constipation).     metoprolol tartrate (LOPRESSOR) 25 MG tablet TAKE 1 TABLET TWICE A DAY 180 tablet 3   OXYGEN Inhale 2 L into the lungs as needed (SOB).     pantoprazole (PROTONIX) 40 MG tablet Take 1 tablet (40 mg total) by mouth 2 (two) times daily. 180 tablet 3   polyethylene glycol (MIRALAX / GLYCOLAX) 17 g packet Take 17 g by mouth daily.     SPIRIVA RESPIMAT 2.5 MCG/ACT AERS USE 2 INHALATIONS DAILY 12 g 3   temozolomide (TEMODAR) 100 MG capsule Take 4 capsules by mouth daily on day 10, 11, 12, 13, and 14 every 4 weeks. Take on an empty stomach or at bedtime to decrease nausea or vomiting 20 capsule 3   Tiotropium Bromide-Olodaterol (STIOLTO RESPIMAT) 2.5-2.5 MCG/ACT AERS Inhale 2 puffs into the lungs daily. 4 g 11   vitamin B-12 (CYANOCOBALAMIN) 1000 MCG tablet Take 1,000 mcg by mouth daily.      vitamin C (ASCORBIC ACID) 500 MG tablet Take 500 mg by mouth daily.     glipiZIDE (GLUCOTROL) 5 MG tablet TAKE 1 TABLET DAILY BEFORE BREAKFAST 90 tablet 3   guaiFENesin (MUCINEX) 600 MG 12 hr tablet Take 600 mg by mouth 2 (two) times daily. (Patient not taking: Reported on 11/27/2022)     ondansetron (ZOFRAN) 8 MG tablet TAKE 1 TABLET BY MOUTH EVERY 8 HOURS AS NEEDED FOR NAUSEA AND/OR VOMITING (Patient not taking: Reported on 11/27/2022) 30 tablet 0   No current facility-administered medications for this visit.     Objective:  BP 136/72   Pulse 70   Temp 97.6 F (36.4 C)   Ht 6\' 3"  (1.905 m)   Wt 172 lb (78 kg)   SpO2 95%   BMI 21.50 kg/m  Gen: NAD, resting comfortably CV: RRR no murmurs rubs or gallops Lungs: CTAB no crackles, wheeze, rhonchi Skin: warm, dry     Assessment and Plan   # Emergency department follow-up  for shortness of breath S: Patient seen in the emergency department on 11/22/2022 for shortness of breath and fever.  Within the prior week had temperature up to 101.3 at home as well as congestion and productive cough.  Morning of admission he felt some shortness of breath so decided to seek care-no chest pain, nausea, vomiting, diarrhea, dysuria or urinary frequency.  Patient with history of neuroendocrine carcinoma with right lower lung  mass on chemotherapy but but he does have a planned 11-month break and then repeat CT.  COVID testing and respiratory panel were negative.  Lactic acid was not elevated  There was concern for URI versus pneumonia.  Pulmonary embolism thought less likely without chest pain or hypoxia and there was no wheezing to suggest COPD exacerbation.  Chest x-ray and urinalysis without clear cause.  They ultimately want the patient and he did have a transient dip in oxygen into the 80s-but patient does have oxygen available at home at baseline.  They recommended outpatient follow-up and he was discharged on cefpodoxime and doxycycline to cover for bacterial infection  Today, reports noted some swelling in right abdomen when getting home from ER but  went back down- also felt pain but that has resolved. Was eating peanuts yesterday and had coughing fit- ended up having some pain in right upper chest (not exertional and with no worsening shortness of breath and seem to worsen with position (but that has resolved. Still getting some shortness of breath (consistent with his baseline- not as bad as day he went into ER)  - cough is better and no more fevers.  A/P: Patient with respiratory illness-was covered for bacterial cause due to immunosuppression and is having significant improvement-he will finish antibiotic course.  Lungs are clear today and he is back to his baseline shortness of breath with COPD-COPD certainly increases the probability of bacterial illness and that may have contributed  as well -Continue home oxygen as needed but was not in need at visit today  # Down mood- reports had elevated symptoms of feeling down with recent illness- not a normal issue for him as evidenced by phq2 prior of 0 recently    11/27/2022    7:57 AM 11/23/2022    9:44 AM 11/23/2022    9:41 AM  Depression screen PHQ 2/9  Decreased Interest 2 0 0  Down, Depressed, Hopeless 3 0 0  PHQ - 2 Score 5 0 0  Altered sleeping 3    Tired, decreased energy 3    Change in appetite 3    Feeling bad or failure about yourself  0    Moving slowly or fidgety/restless 2    Suicidal thoughts 0    PHQ-9 Score 16    Difficult doing work/chores Somewhat difficult     #Chronic kidney disease stage III S: Patient with significant worsening in kidney function dating back to November 2020 when patient was on Afinitor previously- later went back on this but did not worsen as much (did raise CBGs though and determined not effective so switched). Follows with [facility] -Last creatinine at 1.47 which is pretty stable with his baseline.  GFR in the 40s A/P:  stable on recent labs- continue to monitor   #Diabetes mellitus S: No Rx as of 06/23/2019 then later restarted glipizde 10mg  BID then down to 10mg  AM and now 5 mg in AM- current dose  Lab Results  Component Value Date   HGBA1C 6.1 (A) 09/22/2022   HGBA1C 6.4 (A) 05/12/2022   HGBA1C 6.6 (A) 01/09/2022  A/P:  reasonable control- continue current medications - too soon for a1c repeat   #Hypertension S: Compliant with amlodipine 10 mg and metoprolol 25 mg twice daily BP Readings from Last 3 Encounters:  11/27/22 136/72  11/22/22 (!) 151/78  11/08/22 127/72   A/P: stable- continue current medicines    Recommended follow up: Return for next already scheduled visit or sooner if needed.  Future Appointments  Date Time Provider Department Center  01/08/2023 10:15 AM [facility] None  01/08/2023 11:30 AM [facility] [place]  01/10/2023   1:30 PM Si Gaul, MD [facility] None  01/23/2023  1:00 PM Shelva Majestic, MD [facility]  02/27/2023  2:00 PM Freddie Breech, DPM [facility]  11/29/2023  9:15 AM [facility] ANNUAL WELLNESS VISIT 1 [facility]    Lab/Order associations:   ICD-10-CM   1. Respiratory infection  J98.8     2. Type 2 diabetes mellitus with stage 3b chronic kidney disease, without long-term current use of insulin  E11.22    N18.32     3. Neuroendocrine carcinoma  C7A.8     4. Chronic kidney disease (CKD) stage G3b/A1, moderately decreased glomerular filtration rate (GFR) between 30-44 mL/min/1.73 square meter and albuminuria creatinine ratio less than 30 mg/g (HCC)  N18.32     5. Essential hypertension  I10       No orders of the defined types were placed in this encounter.   Return precautions advised.  Tana Conch, MD

## 2022-11-27 NOTE — Patient Instructions (Addendum)
Get your diabetic eye exam scheduled and have them send [place] a copy  Glad you are improving- finish antibiotics  Please return if you have recurrence or worsening of symptoms- definitely let us know if fevers again or return of abdominal or chest pain   Health Maintenance Due  Topic Date Due   Diabetic kidney evaluation - Urine ACR  Never done  Will plan on this next time  Recommended follow up: Return for next already scheduled visit or sooner if needed.

## 2022-11-27 NOTE — Telephone Encounter (Signed)
     Patient  visit on 4/3  at [facility]    Have you been able to follow up with your primary care physician? Yes   The patient was or was not able to obtain any needed medicine or equipment. Yes   Are there diet recommendations that you are having difficulty following? Na   Patient expresses understanding of discharge instructions and education provided has no other needs at this time. yes     Lenard Forth [facility] Guide, [facility] [phone] [address] [place], [place], [place] [address] Phone: [phone] Email: [email or web address]

## 2022-11-27 NOTE — Telephone Encounter (Signed)
Called and spoke with [place] at [facility]. She confirmed that they have the order for the POC but they person who processes the order is currently out of the office.   Called and spoke with [name]. She verbalized understanding and appreciation.   Nothing further needed at time of call.

## 2022-11-28 ENCOUNTER — Ambulatory Visit: Payer: Medicare Other | Admitting: Family Medicine

## 2022-12-21 ENCOUNTER — Telehealth: Payer: Self-pay | Admitting: Internal Medicine

## 2022-12-21 NOTE — Telephone Encounter (Signed)
Called patient regarding upcoming May appointments, patient is notified.  ?

## 2023-01-02 ENCOUNTER — Telehealth: Payer: Self-pay | Admitting: Emergency Medicine

## 2023-01-02 NOTE — Telephone Encounter (Signed)
Patient's sig. Other called regarding an order for a portable oxygen with [employer].  She stated that they still have not heard from the company and it has been a few months.  Please advise and call to discuss further at [phone]

## 2023-01-02 NOTE — Telephone Encounter (Signed)
Received confirmation on 3/1 for the POC - called and spoke w/ [employer] rep, she verbalized that she is currently processing the order and will call pt for setup.

## 2023-01-02 NOTE — Telephone Encounter (Signed)
Called and spoke w/ pt s/o she verbalized understanding of my message. NFN att.

## 2023-01-08 ENCOUNTER — Ambulatory Visit (HOSPITAL_COMMUNITY)
Admission: RE | Admit: 2023-01-08 | Discharge: 2023-01-08 | Disposition: A | Discharge status: Home / Self Care | Payer: Medicare Other | Source: Ambulatory Visit | Attending: Internal Medicine | Admitting: Internal Medicine

## 2023-01-08 ENCOUNTER — Inpatient Hospital Stay: Payer: Medicare Other | Attending: Internal Medicine

## 2023-01-08 DIAGNOSIS — C7A090 Malignant carcinoid tumor of the bronchus and lung: Secondary | ICD-10-CM | POA: Insufficient documentation

## 2023-01-08 DIAGNOSIS — C7B02 Secondary carcinoid tumors of liver: Secondary | ICD-10-CM | POA: Insufficient documentation

## 2023-01-08 DIAGNOSIS — R1310 Dysphagia, unspecified: Secondary | ICD-10-CM | POA: Diagnosis not present

## 2023-01-08 DIAGNOSIS — C7A8 Other malignant neuroendocrine tumors: Secondary | ICD-10-CM | POA: Insufficient documentation

## 2023-01-08 DIAGNOSIS — C7A1 Malignant poorly differentiated neuroendocrine tumors: Secondary | ICD-10-CM | POA: Diagnosis not present

## 2023-01-08 DIAGNOSIS — C7B03 Secondary carcinoid tumors of bone: Secondary | ICD-10-CM | POA: Diagnosis not present

## 2023-01-08 LAB — CBC WITH DIFFERENTIAL (CANCER CENTER ONLY)
Abs Immature Granulocytes: 0.02 10*3/uL (ref 0.00–0.07)
Basophils Absolute: 0.1 10*3/uL (ref 0.0–0.1)
Basophils Relative: 1 %
Eosinophils Absolute: 0.1 10*3/uL (ref 0.0–0.5)
Eosinophils Relative: 3 %
HCT: 37.3 % — ABNORMAL LOW (ref 39.0–52.0)
Hemoglobin: 12.4 g/dL — ABNORMAL LOW (ref 13.0–17.0)
Immature Granulocytes: 0 %
Lymphocytes Relative: 16 %
Lymphs Abs: 0.7 10*3/uL (ref 0.7–4.0)
MCH: 31.5 pg (ref 26.0–34.0)
MCHC: 33.2 g/dL (ref 30.0–36.0)
MCV: 94.7 fL (ref 80.0–100.0)
Monocytes Absolute: 0.4 10*3/uL (ref 0.1–1.0)
Monocytes Relative: 8 %
Neutro Abs: 3.2 10*3/uL (ref 1.7–7.7)
Neutrophils Relative %: 72 %
Platelet Count: 195 10*3/uL (ref 150–400)
RBC: 3.94 MIL/uL — ABNORMAL LOW (ref 4.22–5.81)
RDW: 14.7 % (ref 11.5–15.5)
WBC Count: 4.5 10*3/uL (ref 4.0–10.5)
nRBC: 0 % (ref 0.0–0.2)

## 2023-01-08 LAB — CMP (CANCER CENTER ONLY)
ALT: 11 U/L (ref 0–44)
AST: 12 U/L — ABNORMAL LOW (ref 15–41)
Albumin: 3.9 g/dL (ref 3.5–5.0)
Alkaline Phosphatase: 72 U/L (ref 38–126)
Anion gap: 7 (ref 5–15)
BUN: 17 mg/dL (ref 8–23)
CO2: 26 mmol/L (ref 22–32)
Calcium: 9 mg/dL (ref 8.9–10.3)
Chloride: 105 mmol/L (ref 98–111)
Creatinine: 1.53 mg/dL — ABNORMAL HIGH (ref 0.61–1.24)
GFR, Estimated: 46 mL/min — ABNORMAL LOW (ref >60)
Glucose, Bld: 127 mg/dL — ABNORMAL HIGH (ref 70–99)
Potassium: 4 mmol/L (ref 3.5–5.1)
Sodium: 138 mmol/L (ref 135–145)
Total Bilirubin: 0.4 mg/dL (ref 0.3–1.2)
Total Protein: 8.3 g/dL — ABNORMAL HIGH (ref 6.5–8.1)

## 2023-01-09 ENCOUNTER — Other Ambulatory Visit (HOSPITAL_COMMUNITY): Payer: Self-pay

## 2023-01-10 ENCOUNTER — Other Ambulatory Visit: Payer: Self-pay

## 2023-01-10 ENCOUNTER — Inpatient Hospital Stay (HOSPITAL_BASED_OUTPATIENT_CLINIC_OR_DEPARTMENT_OTHER): Payer: Medicare Other | Admitting: Internal Medicine

## 2023-01-10 VITALS — BP 137/75 | HR 87 | Temp 98.2°F | Resp 16 | Wt 170.1 lb

## 2023-01-10 DIAGNOSIS — C7A8 Other malignant neuroendocrine tumors: Secondary | ICD-10-CM

## 2023-01-10 DIAGNOSIS — C7B8 Other secondary neuroendocrine tumors: Secondary | ICD-10-CM

## 2023-01-10 DIAGNOSIS — C7B03 Secondary carcinoid tumors of bone: Secondary | ICD-10-CM | POA: Diagnosis not present

## 2023-01-10 DIAGNOSIS — R1310 Dysphagia, unspecified: Secondary | ICD-10-CM | POA: Diagnosis not present

## 2023-01-10 DIAGNOSIS — C7A090 Malignant carcinoid tumor of the bronchus and lung: Secondary | ICD-10-CM | POA: Diagnosis not present

## 2023-01-10 DIAGNOSIS — C7B02 Secondary carcinoid tumors of liver: Secondary | ICD-10-CM | POA: Diagnosis not present
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
## 2023-01-11 ENCOUNTER — Other Ambulatory Visit (HOSPITAL_COMMUNITY): Payer: Self-pay

## 2023-01-20 ENCOUNTER — Inpatient Hospital Stay (HOSPITAL_COMMUNITY)
Admission: EM | Admit: 2023-01-20 | Discharge: 2023-01-27 | DRG: 193 | Disposition: A | Discharge status: Home Health | Payer: Medicare Other | Attending: Family Medicine | Admitting: Family Medicine

## 2023-01-20 ENCOUNTER — Encounter (HOSPITAL_COMMUNITY): Payer: Self-pay

## 2023-01-20 ENCOUNTER — Inpatient Hospital Stay (HOSPITAL_COMMUNITY): Payer: Medicare Other

## 2023-01-20 ENCOUNTER — Emergency Department (HOSPITAL_COMMUNITY): Payer: Medicare Other

## 2023-01-20 DIAGNOSIS — J9611 Chronic respiratory failure with hypoxia: Secondary | ICD-10-CM | POA: Diagnosis not present

## 2023-01-20 DIAGNOSIS — E1122 Type 2 diabetes mellitus with diabetic chronic kidney disease: Secondary | ICD-10-CM | POA: Diagnosis not present

## 2023-01-20 DIAGNOSIS — E785 Hyperlipidemia, unspecified: Secondary | ICD-10-CM | POA: Diagnosis present

## 2023-01-20 DIAGNOSIS — D649 Anemia, unspecified: Secondary | ICD-10-CM | POA: Diagnosis present

## 2023-01-20 DIAGNOSIS — Z9981 Dependence on supplemental oxygen: Secondary | ICD-10-CM | POA: Diagnosis not present

## 2023-01-20 DIAGNOSIS — Z7951 Long term (current) use of inhaled steroids: Secondary | ICD-10-CM

## 2023-01-20 DIAGNOSIS — I129 Hypertensive chronic kidney disease with stage 1 through stage 4 chronic kidney disease, or unspecified chronic kidney disease: Secondary | ICD-10-CM | POA: Diagnosis present

## 2023-01-20 DIAGNOSIS — Z66 Do not resuscitate: Secondary | ICD-10-CM | POA: Diagnosis present

## 2023-01-20 DIAGNOSIS — I3139 Other pericardial effusion (noninflammatory): Secondary | ICD-10-CM | POA: Diagnosis not present

## 2023-01-20 DIAGNOSIS — N1832 Chronic kidney disease, stage 3b: Secondary | ICD-10-CM | POA: Diagnosis present

## 2023-01-20 DIAGNOSIS — E119 Type 2 diabetes mellitus without complications: Secondary | ICD-10-CM | POA: Diagnosis not present

## 2023-01-20 DIAGNOSIS — J440 Chronic obstructive pulmonary disease with acute lower respiratory infection: Secondary | ICD-10-CM | POA: Diagnosis not present

## 2023-01-20 DIAGNOSIS — G9341 Metabolic encephalopathy: Secondary | ICD-10-CM | POA: Diagnosis not present

## 2023-01-20 DIAGNOSIS — R0789 Other chest pain: Secondary | ICD-10-CM | POA: Diagnosis not present

## 2023-01-20 DIAGNOSIS — F32A Depression, unspecified: Secondary | ICD-10-CM | POA: Diagnosis not present

## 2023-01-20 DIAGNOSIS — Z85118 Personal history of other malignant neoplasm of bronchus and lung: Secondary | ICD-10-CM

## 2023-01-20 DIAGNOSIS — K219 Gastro-esophageal reflux disease without esophagitis: Secondary | ICD-10-CM | POA: Diagnosis present

## 2023-01-20 DIAGNOSIS — C7B02 Secondary carcinoid tumors of liver: Secondary | ICD-10-CM | POA: Diagnosis not present

## 2023-01-20 DIAGNOSIS — J181 Lobar pneumonia, unspecified organism: Principal | ICD-10-CM | POA: Diagnosis present

## 2023-01-20 DIAGNOSIS — Z9221 Personal history of antineoplastic chemotherapy: Secondary | ICD-10-CM

## 2023-01-20 DIAGNOSIS — J439 Emphysema, unspecified: Secondary | ICD-10-CM | POA: Diagnosis not present

## 2023-01-20 DIAGNOSIS — C7A8 Other malignant neuroendocrine tumors: Secondary | ICD-10-CM | POA: Diagnosis present

## 2023-01-20 DIAGNOSIS — J918 Pleural effusion in other conditions classified elsewhere: Secondary | ICD-10-CM | POA: Diagnosis present

## 2023-01-20 DIAGNOSIS — J189 Pneumonia, unspecified organism: Principal | ICD-10-CM | POA: Diagnosis present

## 2023-01-20 DIAGNOSIS — R918 Other nonspecific abnormal finding of lung field: Secondary | ICD-10-CM | POA: Diagnosis not present

## 2023-01-20 DIAGNOSIS — J9811 Atelectasis: Secondary | ICD-10-CM | POA: Diagnosis not present

## 2023-01-20 DIAGNOSIS — R739 Hyperglycemia, unspecified: Secondary | ICD-10-CM | POA: Diagnosis not present

## 2023-01-20 DIAGNOSIS — N1831 Chronic kidney disease, stage 3a: Secondary | ICD-10-CM

## 2023-01-20 DIAGNOSIS — C801 Malignant (primary) neoplasm, unspecified: Secondary | ICD-10-CM | POA: Diagnosis not present

## 2023-01-20 DIAGNOSIS — F419 Anxiety disorder, unspecified: Secondary | ICD-10-CM | POA: Diagnosis not present

## 2023-01-20 DIAGNOSIS — Z7984 Long term (current) use of oral hypoglycemic drugs: Secondary | ICD-10-CM | POA: Diagnosis not present

## 2023-01-20 DIAGNOSIS — R509 Fever, unspecified: Secondary | ICD-10-CM | POA: Diagnosis not present

## 2023-01-20 DIAGNOSIS — M109 Gout, unspecified: Secondary | ICD-10-CM | POA: Diagnosis present

## 2023-01-20 DIAGNOSIS — R0602 Shortness of breath: Secondary | ICD-10-CM | POA: Diagnosis not present

## 2023-01-20 DIAGNOSIS — I700 Atherosclerosis of aorta: Secondary | ICD-10-CM | POA: Diagnosis not present

## 2023-01-20 DIAGNOSIS — Z801 Family history of malignant neoplasm of trachea, bronchus and lung: Secondary | ICD-10-CM

## 2023-01-20 DIAGNOSIS — Z800 Family history of malignant neoplasm of digestive organs: Secondary | ICD-10-CM

## 2023-01-20 DIAGNOSIS — K449 Diaphragmatic hernia without obstruction or gangrene: Secondary | ICD-10-CM | POA: Diagnosis not present

## 2023-01-20 DIAGNOSIS — I959 Hypotension, unspecified: Secondary | ICD-10-CM | POA: Diagnosis present

## 2023-01-20 DIAGNOSIS — Z8249 Family history of ischemic heart disease and other diseases of the circulatory system: Secondary | ICD-10-CM | POA: Diagnosis not present

## 2023-01-20 DIAGNOSIS — R109 Unspecified abdominal pain: Secondary | ICD-10-CM | POA: Diagnosis not present

## 2023-01-20 DIAGNOSIS — Z888 Allergy status to other drugs, medicaments and biological substances status: Secondary | ICD-10-CM

## 2023-01-20 DIAGNOSIS — Z7189 Other specified counseling: Secondary | ICD-10-CM | POA: Diagnosis not present

## 2023-01-20 DIAGNOSIS — C7A090 Malignant carcinoid tumor of the bronchus and lung: Secondary | ICD-10-CM | POA: Diagnosis not present

## 2023-01-20 DIAGNOSIS — C787 Secondary malignant neoplasm of liver and intrahepatic bile duct: Secondary | ICD-10-CM | POA: Diagnosis not present

## 2023-01-20 DIAGNOSIS — Z833 Family history of diabetes mellitus: Secondary | ICD-10-CM

## 2023-01-20 DIAGNOSIS — R0902 Hypoxemia: Secondary | ICD-10-CM | POA: Diagnosis not present

## 2023-01-20 DIAGNOSIS — J9621 Acute and chronic respiratory failure with hypoxia: Secondary | ICD-10-CM | POA: Diagnosis present

## 2023-01-20 DIAGNOSIS — E1151 Type 2 diabetes mellitus with diabetic peripheral angiopathy without gangrene: Secondary | ICD-10-CM | POA: Diagnosis present

## 2023-01-20 DIAGNOSIS — F05 Delirium due to known physiological condition: Secondary | ICD-10-CM | POA: Diagnosis not present

## 2023-01-20 DIAGNOSIS — J90 Pleural effusion, not elsewhere classified: Secondary | ICD-10-CM | POA: Diagnosis present

## 2023-01-20 DIAGNOSIS — Z87891 Personal history of nicotine dependence: Secondary | ICD-10-CM | POA: Diagnosis not present

## 2023-01-20 DIAGNOSIS — F4310 Post-traumatic stress disorder, unspecified: Secondary | ICD-10-CM | POA: Diagnosis present

## 2023-01-20 DIAGNOSIS — R339 Retention of urine, unspecified: Secondary | ICD-10-CM | POA: Diagnosis not present

## 2023-01-20 DIAGNOSIS — R079 Chest pain, unspecified: Secondary | ICD-10-CM | POA: Diagnosis not present

## 2023-01-20 DIAGNOSIS — R091 Pleurisy: Secondary | ICD-10-CM | POA: Diagnosis not present

## 2023-01-20 DIAGNOSIS — J449 Chronic obstructive pulmonary disease, unspecified: Secondary | ICD-10-CM | POA: Diagnosis not present

## 2023-01-20 DIAGNOSIS — E1129 Type 2 diabetes mellitus with other diabetic kidney complication: Secondary | ICD-10-CM | POA: Diagnosis present

## 2023-01-20 DIAGNOSIS — R0689 Other abnormalities of breathing: Secondary | ICD-10-CM | POA: Diagnosis not present

## 2023-01-20 DIAGNOSIS — Z515 Encounter for palliative care: Secondary | ICD-10-CM | POA: Diagnosis not present

## 2023-01-20 DIAGNOSIS — Z79899 Other long term (current) drug therapy: Secondary | ICD-10-CM

## 2023-01-20 DIAGNOSIS — K5900 Constipation, unspecified: Secondary | ICD-10-CM | POA: Diagnosis present

## 2023-01-20 LAB — BASIC METABOLIC PANEL
Anion gap: 9 (ref 5–15)
BUN: 17 mg/dL (ref 8–23)
CO2: 22 mmol/L (ref 22–32)
Calcium: 8.5 mg/dL — ABNORMAL LOW (ref 8.9–10.3)
Chloride: 103 mmol/L (ref 98–111)
Creatinine, Ser: 1.46 mg/dL — ABNORMAL HIGH (ref 0.61–1.24)
GFR, Estimated: 49 mL/min — ABNORMAL LOW (ref >60)
Glucose, Bld: 227 mg/dL — ABNORMAL HIGH (ref 70–99)
Potassium: 3.7 mmol/L (ref 3.5–5.1)
Sodium: 134 mmol/L — ABNORMAL LOW (ref 135–145)

## 2023-01-20 LAB — TROPONIN I (HIGH SENSITIVITY)
Troponin I (High Sensitivity): 5 ng/L (ref <18)
Troponin I (High Sensitivity): 6 ng/L (ref <18)

## 2023-01-20 LAB — CREATININE, SERUM
Creatinine, Ser: 1.46 mg/dL — ABNORMAL HIGH (ref 0.61–1.24)
GFR, Estimated: 49 mL/min — ABNORMAL LOW (ref >60)

## 2023-01-20 LAB — CBC
HCT: 36.9 % — ABNORMAL LOW (ref 39.0–52.0)
Hemoglobin: 11.8 g/dL — ABNORMAL LOW (ref 13.0–17.0)
MCH: 30 pg (ref 26.0–34.0)
MCHC: 32 g/dL (ref 30.0–36.0)
MCV: 93.9 fL (ref 80.0–100.0)
Platelets: 258 10*3/uL (ref 150–400)
RBC: 3.93 MIL/uL — ABNORMAL LOW (ref 4.22–5.81)
RDW: 15 % (ref 11.5–15.5)
WBC: 10.8 10*3/uL — ABNORMAL HIGH (ref 4.0–10.5)
nRBC: 0 % (ref 0.0–0.2)

## 2023-01-20 LAB — LACTIC ACID, PLASMA
Lactic Acid, Venous: 2 mmol/L (ref 0.5–1.9)
Lactic Acid, Venous: 1.1 mmol/L (ref 0.5–1.9)

## 2023-01-20 MED ORDER — SODIUM CHLORIDE 0.9 % IV SOLN
2.0000 g | Freq: Two times a day (BID) | INTRAVENOUS | Status: DC
  Administered 2023-01-20 – 2023-01-23 (×6): 2 g via INTRAVENOUS
  Filled 2023-01-20 (×6): qty 12.5

## 2023-01-20 MED ORDER — METOPROLOL TARTRATE 12.5 MG HALF TABLET
12.5000 mg | ORAL_TABLET | Freq: Two times a day (BID) | ORAL | Status: DC
  Administered 2023-01-20 – 2023-01-24 (×8): 12.5 mg via ORAL
  Filled 2023-01-20 (×8): qty 1

## 2023-01-20 MED ORDER — VANCOMYCIN HCL 1500 MG/300ML IV SOLN
1500.0000 mg | Freq: Once | INTRAVENOUS | Status: AC
  Administered 2023-01-20: 1500 mg via INTRAVENOUS
  Filled 2023-01-20: qty 300

## 2023-01-20 MED ORDER — UMECLIDINIUM BROMIDE 62.5 MCG/ACT IN AEPB
1.0000 | INHALATION_SPRAY | Freq: Every day | RESPIRATORY_TRACT | Status: DC
  Administered 2023-01-21 – 2023-01-24 (×4): 1 via RESPIRATORY_TRACT
  Filled 2023-01-20 (×2): qty 7

## 2023-01-20 MED ORDER — VITAMIN C 500 MG PO TABS
500.0000 mg | ORAL_TABLET | Freq: Every day | ORAL | Status: DC
  Administered 2023-01-21 – 2023-01-27 (×7): 500 mg via ORAL
  Filled 2023-01-20 (×7): qty 1

## 2023-01-20 MED ORDER — INSULIN ASPART 100 UNIT/ML IJ SOLN
0.0000 [IU] | Freq: Three times a day (TID) | INTRAMUSCULAR | Status: DC
  Administered 2023-01-21: 3 [IU] via SUBCUTANEOUS
  Administered 2023-01-21: 7 [IU] via SUBCUTANEOUS
  Administered 2023-01-21 – 2023-01-23 (×5): 2 [IU] via SUBCUTANEOUS
  Administered 2023-01-23: 3 [IU] via SUBCUTANEOUS
  Administered 2023-01-23 – 2023-01-24 (×2): 2 [IU] via SUBCUTANEOUS
  Administered 2023-01-24: 3 [IU] via SUBCUTANEOUS
  Administered 2023-01-25: 1 [IU] via SUBCUTANEOUS
  Administered 2023-01-25 (×2): 2 [IU] via SUBCUTANEOUS
  Administered 2023-01-26: 1 [IU] via SUBCUTANEOUS
  Administered 2023-01-26 (×2): 2 [IU] via SUBCUTANEOUS
  Administered 2023-01-27: 1 [IU] via SUBCUTANEOUS

## 2023-01-20 MED ORDER — VITAMIN B-12 1000 MCG PO TABS
1000.0000 ug | ORAL_TABLET | Freq: Every day | ORAL | Status: DC
  Administered 2023-01-21 – 2023-01-27 (×7): 1000 ug via ORAL
  Filled 2023-01-20 (×7): qty 1

## 2023-01-20 MED ORDER — POLYETHYLENE GLYCOL 3350 17 G PO PACK
17.0000 g | PACK | Freq: Every day | ORAL | Status: DC
  Administered 2023-01-21 – 2023-01-22 (×2): 17 g via ORAL
  Filled 2023-01-20 (×2): qty 1

## 2023-01-20 MED ORDER — ARFORMOTEROL TARTRATE 15 MCG/2ML IN NEBU
15.0000 ug | INHALATION_SOLUTION | Freq: Two times a day (BID) | RESPIRATORY_TRACT | Status: DC
  Administered 2023-01-20 – 2023-01-26 (×13): 15 ug via RESPIRATORY_TRACT
  Filled 2023-01-20 (×14): qty 2

## 2023-01-20 MED ORDER — PIPERACILLIN-TAZOBACTAM 3.375 G IVPB 30 MIN
3.3750 g | Freq: Once | INTRAVENOUS | Status: AC
  Administered 2023-01-20: 3.375 g via INTRAVENOUS
  Filled 2023-01-20: qty 50

## 2023-01-20 MED ORDER — PANTOPRAZOLE SODIUM 40 MG PO TBEC
40.0000 mg | DELAYED_RELEASE_TABLET | Freq: Two times a day (BID) | ORAL | Status: DC
  Administered 2023-01-20 – 2023-01-27 (×13): 40 mg via ORAL
  Filled 2023-01-20 (×13): qty 1

## 2023-01-20 MED ORDER — GUAIFENESIN ER 600 MG PO TB12
600.0000 mg | ORAL_TABLET | Freq: Two times a day (BID) | ORAL | Status: DC
  Administered 2023-01-20 – 2023-01-27 (×13): 600 mg via ORAL
  Filled 2023-01-20 (×13): qty 1

## 2023-01-20 MED ORDER — ACETAMINOPHEN 500 MG PO TABS
1000.0000 mg | ORAL_TABLET | Freq: Once | ORAL | Status: AC
  Administered 2023-01-20: 1000 mg via ORAL
  Filled 2023-01-20: qty 2

## 2023-01-20 MED ORDER — ALBUTEROL SULFATE (2.5 MG/3ML) 0.083% IN NEBU: 2.5000 mg | INHALATION_SOLUTION | RESPIRATORY_TRACT | Status: DC | PRN

## 2023-01-20 MED ORDER — LIDOCAINE 5 % EX PTCH
1.0000 | MEDICATED_PATCH | CUTANEOUS | Status: DC
  Administered 2023-01-20 – 2023-01-26 (×6): 1 via TRANSDERMAL
  Filled 2023-01-20 (×6): qty 1

## 2023-01-20 MED ORDER — ENOXAPARIN SODIUM 40 MG/0.4ML IJ SOSY
40.0000 mg | PREFILLED_SYRINGE | INTRAMUSCULAR | Status: DC
  Administered 2023-01-20 – 2023-01-26 (×7): 40 mg via SUBCUTANEOUS
  Filled 2023-01-20 (×7): qty 0.4

## 2023-01-20 MED ORDER — IOHEXOL 350 MG/ML SOLN
80.0000 mL | Freq: Once | INTRAVENOUS | Status: AC | PRN
  Administered 2023-01-20: 80 mL via INTRAVENOUS

## 2023-01-20 MED ORDER — VITAMIN D3 25 MCG (1000 UNIT) PO TABS
1000.0000 [IU] | ORAL_TABLET | Freq: Every day | ORAL | Status: DC
  Administered 2023-01-21 – 2023-01-27 (×7): 1000 [IU] via ORAL
  Filled 2023-01-20 (×7): qty 1

## 2023-01-20 MED ORDER — SODIUM CHLORIDE 0.9 % IV SOLN: 1.0000 g | INTRAVENOUS | Status: DC

## 2023-01-20 MED ORDER — VANCOMYCIN HCL IN DEXTROSE 1-5 GM/200ML-% IV SOLN
1000.0000 mg | INTRAVENOUS | Status: DC
  Administered 2023-01-21 – 2023-01-22 (×2): 1000 mg via INTRAVENOUS
  Filled 2023-01-20 (×3): qty 200

## 2023-01-20 MED ORDER — CALCIUM CARBONATE ANTACID 1000 MG PO CHEW: 2000.0000 mg | CHEWABLE_TABLET | Freq: Every day | ORAL | Status: DC
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
## 2023-01-20 NOTE — ED Triage Notes (Signed)
BIBA from home for right sided chest pain. Per EMS, temp was 102.2 and pt has a hx of sepsis.
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
## 2023-01-20 NOTE — ED Provider Notes (Signed)
[place] EMERGENCY DEPARTMENT AT [facility] Provider Note   CSN: [id number] Arrival date & time: 01/20/23  1523     History  Chief Complaint  Patient presents with   Chest Pain   Fever    Richard Navarro is a 77 y.o. male.  77 yo M with a cough and difficulty breathing this has been going on for the better part of 2 weeks.  Gotten sniffily worse over the past 48 hours.  Feeling subjectively hot and cold but no obvious measured temperature at home.  Denies abdominal pain denies urinary symptoms.  Denies rash.  He is having some right-sided chest discomfort that he has trouble describing it goes from the right to the left.  Nothing seems to make it better or worse.  He has been very fatigued and unable to get up and do his normal activities of daily living.   Chest Pain Associated symptoms: fever   Fever Associated symptoms: chest pain        Home Medications Prior to Admission medications   Medication Sig Start Date End Date Taking? Authorizing Provider  albuterol (VENTOLIN HFA) 108 (90 Base) MCG/ACT inhaler Inhale 2 puffs into the lungs every 4 (four) hours as needed for wheezing or shortness of breath. 12/07/20   Leslye Peer, MD  allopurinol (ZYLOPRIM) 100 MG tablet TAKE 1 TABLET DAILY 11/10/22   Shelva Majestic, MD  amLODipine (NORVASC) 10 MG tablet TAKE 1 TABLET DAILY 08/29/22   Shelva Majestic, MD  Calcium Carbonate Antacid (TUMS ULTRA 1000 PO) Take 2,000 mg by mouth in the morning and at bedtime.    [provider]  capecitabine (XELODA) 500 MG tablet TAKE 3 TABLETS BY MOUTH TWICE DAILY FOR 14 DAYS EVERY 4 WEEKS. 09/12/22 09/12/23  Si Gaul, MD  cefpodoxime (VANTIN) 200 MG tablet Take 1 tablet (200 mg total) by mouth 2 (two) times daily. 11/22/22   Rondel Baton, MD  Cholecalciferol (VITAMIN D3) 25 MCG (1000 UT) CAPS Take 1,000 Units by mouth daily.    [provider]  Continuous Blood Gluc Receiver (DEXCOM G7 RECEIVER) DEVI  Please provide 1 receiver 05/12/22   Shelva Majestic, MD  Continuous Blood Gluc Sensor (DEXCOM G7 SENSOR) MISC 1 Box by Does not apply route every 30 (thirty) days. 05/12/22   Shelva Majestic, MD  docusate sodium (COLACE) 100 MG capsule Take 300 mg by mouth daily.    [provider]  Ferrous Sulfate (IRON) 325 (65 Fe) MG TABS Take 325 mg by mouth every other day.    [provider]  glipiZIDE (GLUCOTROL) 5 MG tablet TAKE 1 TABLET DAILY BEFORE BREAKFAST 11/27/22   Shelva Majestic, MD  glucose blood test strip Use to test blood sugar twice a day 05/12/22   Shelva Majestic, MD  guaiFENesin (MUCINEX) 600 MG 12 hr tablet Take 600 mg by mouth 2 (two) times daily. Patient not taking: Reported on 11/27/2022    [provider]  ketotifen (ZADITOR) 0.025 % ophthalmic solution Place 1 drop into both eyes daily as needed (allergies).    [provider]  magnesium hydroxide (MILK OF MAGNESIA) 400 MG/5ML suspension Take 30 mLs by mouth daily as needed (constipation).    [provider]  metoprolol tartrate (LOPRESSOR) 25 MG tablet TAKE 1 TABLET TWICE A DAY 11/16/22   Shelva Majestic, MD  ondansetron (ZOFRAN) 8 MG tablet TAKE 1 TABLET BY MOUTH EVERY 8 HOURS AS NEEDED FOR NAUSEA AND/OR  VOMITING Patient not taking: Reported on 11/27/2022 09/12/22 09/12/23  Si Gaul, MD  OXYGEN Inhale 2 L into the lungs as needed (SOB).    [provider]  pantoprazole (PROTONIX) 40 MG tablet Take 1 tablet (40 mg total) by mouth 2 (two) times daily. 01/09/22   Shelva Majestic, MD  polyethylene glycol (MIRALAX / GLYCOLAX) 17 g packet Take 17 g by mouth daily.    [provider]  SPIRIVA RESPIMAT 2.5 MCG/ACT AERS USE 2 INHALATIONS DAILY 10/19/22   Leslye Peer, MD  temozolomide (TEMODAR) 100 MG capsule Take 4 capsules by mouth daily on day 10, 11, 12, 13, and 14 every 4 weeks. Take on an empty stomach or at bedtime to decrease nausea or vomiting 09/12/22 09/12/23   Si Gaul, MD  Tiotropium Bromide-Olodaterol (STIOLTO RESPIMAT) 2.5-2.5 MCG/ACT AERS Inhale 2 puffs into the lungs daily. 10/31/22   Leslye Peer, MD  vitamin B-12 (CYANOCOBALAMIN) 1000 MCG tablet Take 1,000 mcg by mouth daily.     [provider]  vitamin C (ASCORBIC ACID) 500 MG tablet Take 500 mg by mouth daily.    [provider]      Allergies    Lisinopril and Simvastatin    Review of Systems   Review of Systems  Constitutional:  Positive for fever.  Cardiovascular:  Positive for chest pain.    Physical Exam Updated Vital Signs BP (!) 131/96   Pulse 94   Temp 98.5 F (36.9 C)   Resp 12   SpO2 95%  Physical Exam Vitals and nursing note reviewed.  Constitutional:      Appearance: He is well-developed.     Comments: Chronically ill appearing  HENT:     Head: Normocephalic and atraumatic.  Eyes:     Pupils: Pupils are equal, round, and reactive to light.  Neck:     Vascular: No JVD.  Cardiovascular:     Rate and Rhythm: Normal rate and regular rhythm.     Heart sounds: No murmur heard.    No friction rub. No gallop.  Pulmonary:     Effort: Tachypnea present. No respiratory distress.     Breath sounds: No wheezing.  Abdominal:     General: There is no distension.     Tenderness: There is no abdominal tenderness. There is no guarding or rebound.  Musculoskeletal:        General: Normal range of motion.     Cervical back: Normal range of motion and neck supple.  Skin:    Coloration: Skin is not pale.     Findings: No rash.  Neurological:     Mental Status: He is alert and oriented to person, place, and time.  Psychiatric:        Behavior: Behavior normal.     ED Results / Procedures / Treatments   Labs (all labs ordered are listed, but only abnormal results are displayed) Labs Reviewed  BASIC METABOLIC PANEL - Abnormal; Notable for the following components:      Result Value   Sodium 134 (*)    Glucose, Bld 227 (*)     Creatinine, Ser 1.46 (*)    Calcium 8.5 (*)    GFR, Estimated 49 (*)    All other components within normal limits  CBC - Abnormal; Notable for the following components:   WBC 10.8 (*)    RBC 3.93 (*)    Hemoglobin 11.8 (*)    HCT 36.9 (*)    All other  components within normal limits  CULTURE, BLOOD (ROUTINE X 2)  CULTURE, BLOOD (ROUTINE X 2)  LACTIC ACID, PLASMA  LACTIC ACID, PLASMA  TROPONIN I (HIGH SENSITIVITY)  TROPONIN I (HIGH SENSITIVITY)    EKG EKG Interpretation  Date/Time:  Saturday January 20 2023 15:41:17 EDT Ventricular Rate:  99 PR Interval:  153 QRS Duration: 83 QT Interval:  323 QTC Calculation: 415 R Axis:   75 Text Interpretation: Sinus rhythm Borderline T abnormalities, anterior leads No significant change since last tracing Confirmed by Melene Plan [phone]) on 01/20/2023 3:44:40 PM  Radiology DG Chest 2 View  Result Date: 01/20/2023 CLINICAL DATA:  Chest pain.  Fever. EXAM: CHEST - 2 VIEW COMPARISON:  November 22, 2022 FINDINGS: New infiltrate in the right base. No pneumothorax. No other interval changes. IMPRESSION: New infiltrate in the right base consistent with pneumonia. Recommend follow-up to resolution. Electronically Signed   By: Gerome Sam III M.D.   On: 01/20/2023 16:35    Procedures Procedures    Medications Ordered in ED Medications  vancomycin (VANCOREADY) IVPB 1500 mg/300 mL (1,500 mg Intravenous New Bag/Given 01/20/23 1716)  acetaminophen (TYLENOL) tablet 1,000 mg (1,000 mg Oral Given 01/20/23 1636)  piperacillin-tazobactam (ZOSYN) IVPB 3.375 g (3.375 g Intravenous New Bag/Given 01/20/23 1643)    ED Course/ Medical Decision Making/ A&P                             Medical Decision Making Amount and/or Complexity of Data Reviewed Labs: ordered. Radiology: ordered.  Risk OTC drugs. Prescription drug management.   77 yo M with chief complaints of cough chest pain and difficulty breathing.  This pain going on for a couple weeks but worsening  over the past couple days.  History of lung cancer.  He has home oxygen and is supposed to be on 2 L all the time.  Will obtain a chest x-ray blood work.  CXR independently interpreted by me with RLL infiltrate.  Start on HCAP antibiotics.   Troponin negative, no significant anemia, no significant electrolyte abnormality.  Will discuss with medicine.  The patients results and plan were reviewed and discussed.   Any x-rays performed were independently reviewed by myself.   Differential diagnosis were considered with the presenting HPI.  Medications  vancomycin (VANCOREADY) IVPB 1500 mg/300 mL (1,500 mg Intravenous New Bag/Given 01/20/23 1716)  acetaminophen (TYLENOL) tablet 1,000 mg (1,000 mg Oral Given 01/20/23 1636)  piperacillin-tazobactam (ZOSYN) IVPB 3.375 g (3.375 g Intravenous New Bag/Given 01/20/23 1643)    Vitals:   01/20/23 1535 01/20/23 1630 01/20/23 1651  BP: 134/69  (!) 131/96  Pulse: 100 (!) 101 94  Resp: (!) 25 (!) 35 12  Temp: 99.7 F (37.6 C)  98.5 F (36.9 C)  SpO2: 98% 100% 95%    Final diagnoses:  HCAP (healthcare-associated pneumonia)    Admission/ observation were discussed with the admitting physician, patient and/or family and they are comfortable with the plan.          Final Clinical Impression(s) / ED Diagnoses Final diagnoses:  HCAP (healthcare-associated pneumonia)    Rx / DC Orders ED Discharge Orders     None         Melene Plan, DO 01/20/23 1718

## 2023-01-20 NOTE — Progress Notes (Signed)
Pharmacy Antibiotic Note  KALLIN NEYHART is a 77 y.o. male admitted on 01/20/2023 with pneumonia.  Pharmacy has been consulted for vanc/cefepime dosing.  Plan: Vanc 1500mg  IV x 1 then 1g q24 - goal AUC 400-550 Cefepime 2g IV q12 per current renal function     Temp (24hrs), Avg:98.7 F (37.1 C), Min:97.9 F (36.6 C), Max:99.7 F (37.6 C)  Recent Labs  Lab 01/20/23 1543 01/20/23 1717  WBC 10.8*  --   CREATININE 1.46*  --   LATICACIDVEN  --  2.0*    Estimated Creatinine Clearance: 45.5 mL/min (A) (by C-G formula based on SCr of 1.46 mg/dL (H)).    Allergies  Allergen Reactions   Lisinopril Swelling    Angioedema- on this and afinitor same time   Simvastatin Other (See Comments)    Joint ache     Thank you for allowing pharmacy to be a part of this patient's care.  Berkley Harvey 01/20/2023 7:05 PM

## 2023-01-21 ENCOUNTER — Other Ambulatory Visit: Payer: Self-pay

## 2023-01-21 ENCOUNTER — Inpatient Hospital Stay (HOSPITAL_COMMUNITY): Payer: Medicare Other

## 2023-01-21 DIAGNOSIS — J189 Pneumonia, unspecified organism: Secondary | ICD-10-CM | POA: Diagnosis not present

## 2023-01-21 LAB — CBC WITH DIFFERENTIAL/PLATELET
Abs Immature Granulocytes: 0.18 10*3/uL — ABNORMAL HIGH (ref 0.00–0.07)
Basophils Absolute: 0.1 10*3/uL (ref 0.0–0.1)
Basophils Relative: 0 %
Eosinophils Absolute: 0 10*3/uL (ref 0.0–0.5)
Eosinophils Relative: 0 %
HCT: 40.4 % (ref 39.0–52.0)
Hemoglobin: 12.7 g/dL — ABNORMAL LOW (ref 13.0–17.0)
Immature Granulocytes: 1 %
Lymphocytes Relative: 4 %
Lymphs Abs: 0.6 10*3/uL — ABNORMAL LOW (ref 0.7–4.0)
MCH: 30.3 pg (ref 26.0–34.0)
MCHC: 31.4 g/dL (ref 30.0–36.0)
MCV: 96.4 fL (ref 80.0–100.0)
Monocytes Absolute: 0.7 10*3/uL (ref 0.1–1.0)
Monocytes Relative: 5 %
Neutro Abs: 13.4 10*3/uL — ABNORMAL HIGH (ref 1.7–7.7)
Neutrophils Relative %: 90 %
Platelets: 251 10*3/uL (ref 150–400)
RBC: 4.19 MIL/uL — ABNORMAL LOW (ref 4.22–5.81)
RDW: 15.2 % (ref 11.5–15.5)
WBC: 14.9 10*3/uL — ABNORMAL HIGH (ref 4.0–10.5)
nRBC: 0 % (ref 0.0–0.2)

## 2023-01-21 LAB — BLOOD CULTURE ID PANEL (REFLEXED) - BCID2

## 2023-01-21 LAB — BODY FLUID CELL COUNT WITH DIFFERENTIAL
Eos, Fluid: 0 %
Lymphs, Fluid: 1 %
Monocyte-Macrophage-Serous Fluid: 6 % — ABNORMAL LOW (ref 50–90)
Neutrophil Count, Fluid: 93 % — ABNORMAL HIGH (ref 0–25)
Total Nucleated Cell Count, Fluid: 8208 cu mm — ABNORMAL HIGH (ref 0–1000)

## 2023-01-21 LAB — COMPREHENSIVE METABOLIC PANEL
ALT: 25 U/L (ref 0–44)
AST: 17 U/L (ref 15–41)
Albumin: 3.5 g/dL (ref 3.5–5.0)
Alkaline Phosphatase: 57 U/L (ref 38–126)
Anion gap: 11 (ref 5–15)
BUN: 20 mg/dL (ref 8–23)
CO2: 17 mmol/L — ABNORMAL LOW (ref 22–32)
Calcium: 8.7 mg/dL — ABNORMAL LOW (ref 8.9–10.3)
Chloride: 106 mmol/L (ref 98–111)
Creatinine, Ser: 1.66 mg/dL — ABNORMAL HIGH (ref 0.61–1.24)
GFR, Estimated: 42 mL/min — ABNORMAL LOW (ref >60)
Glucose, Bld: 208 mg/dL — ABNORMAL HIGH (ref 70–99)
Potassium: 3.8 mmol/L (ref 3.5–5.1)
Sodium: 134 mmol/L — ABNORMAL LOW (ref 135–145)
Total Bilirubin: 1.1 mg/dL (ref 0.3–1.2)
Total Protein: 8.1 g/dL (ref 6.5–8.1)

## 2023-01-21 LAB — GLUCOSE, CAPILLARY
Glucose-Capillary: 206 mg/dL — ABNORMAL HIGH (ref 70–99)
Glucose-Capillary: 306 mg/dL — ABNORMAL HIGH (ref 70–99)
Glucose-Capillary: 305 mg/dL — ABNORMAL HIGH (ref 70–99)
Glucose-Capillary: 179 mg/dL — ABNORMAL HIGH (ref 70–99)
Glucose-Capillary: 221 mg/dL — ABNORMAL HIGH (ref 70–99)

## 2023-01-21 LAB — LACTATE DEHYDROGENASE: LDH: 126 U/L (ref 98–192)

## 2023-01-21 LAB — LACTATE DEHYDROGENASE, PLEURAL OR PERITONEAL FLUID: LD, Fluid: 725 U/L — ABNORMAL HIGH (ref 3–23)

## 2023-01-21 LAB — MRSA NEXT GEN BY PCR, NASAL: MRSA by PCR Next Gen: NOT DETECTED

## 2023-01-21 MED ORDER — LIDOCAINE HCL 1 % IJ SOLN
INTRAMUSCULAR | Status: AC
  Filled 2023-01-21: qty 20

## 2023-01-21 MED ORDER — MORPHINE SULFATE (PF) 2 MG/ML IV SOLN
2.0000 mg | INTRAVENOUS | Status: DC | PRN
  Administered 2023-01-23 – 2023-01-26 (×3): 2 mg via INTRAVENOUS
  Filled 2023-01-21 (×3): qty 1

## 2023-01-21 MED ORDER — OXYCODONE HCL 5 MG PO TABS
5.0000 mg | ORAL_TABLET | ORAL | Status: DC | PRN
  Administered 2023-01-21 – 2023-01-26 (×9): 5 mg via ORAL
  Filled 2023-01-21 (×10): qty 1

## 2023-01-21 NOTE — Progress Notes (Signed)
Pt refusing telemetry at this time 

## 2023-01-21 NOTE — Procedures (Signed)
PROCEDURE SUMMARY:  Successful US guided Right thoracentesis. Yielded 550 mL of hazy yellow fluid. Patient tolerated procedure well. No immediate complications. EBL = trace  Specimen was sent for labs.  Post procedure chest X-ray reveals no pneumothorax  Kerrington Sova S Gertude Benito PA-C 01/21/2023 9:24 AM

## 2023-01-21 NOTE — Progress Notes (Addendum)
PROGRESS NOTE    Richard Navarro  ZDG:[id number] DOB: 1945/11/12 DOA: 01/20/2023 PCP: Shelva Majestic, MD  Chief Complaint  Patient presents with   Chest Pain   Fever    Brief Narrative:   Richard Navarro is Richard Navarro 77 y.o. 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  Admitted for pneumonia and R effusion.  Assessment & Plan:   Principal Problem:   Pneumonia Active Problems:   Diabetes mellitus with renal manifestation (HCC)   COPD (chronic obstructive pulmonary disease) (HCC)   Lobar pneumonia (HCC)   Neuroendocrine carcinoma (HCC)  Acute on Chronic Hypoxic Respiratory Failure  Exudative Right Pleural Effusion  Community Acquired Pneumonia - on 3-4 L, typically on 2 L at home - CT chest with increasing R pleural effusion with developing lobular component in right upper chest and increasing infiltration in the right lung (concern for progression of neoplasm vs development of superimposed pneumonia) -> he's noted some infectious symptoms over the past week, concerning for infectious cause - cefepime - MRSA, urine strep, urine legionella, sputum culture pending collection - blood cultures pending - s/p R thora with 550 cc hazy fluid removed -> exudative by lights, awaiting labs including gram stain/culture and cytology.  Cell count with 95188 nucleated cells, 93% neutrophils   Staph in blood cultures Unclear significance, possible contaminant, will follow  COPD/ chronic hypoxic respiratory failure on 2liters at home No wheezing on exam, no acute  hypoxia continue home meds, continue o2 2liters   NIDDM2, controlled Recent A1c 6.1 [place] home oral meds, start ssi while inpatient - will adjust prn  Carb modified diet   CKDIIIa Cr close to baseline, renal dosing meds   stage IV low-grade neuroendocrine carcinoma Xeloda and temodar every 4 weeks , His treatment has been on hold since March 2024 due to persistent fatigue and cough productive of thick sputum F/u with oncology Dr Arbutus Ped  Will discuss 6/3    DVT prophylaxis: lovenox Code Status: full Family Communication: none Disposition:   Status is: Inpatient Remains inpatient appropriate because: need for continued w/u   Consultants:  [facility] Oncology will be consulted 6/3  Procedures:  thoracentesis  Antimicrobials:  Anti-infectives (From admission, onward)    Start     Dose/Rate Route Frequency Ordered Stop   01/21/23 1800  vancomycin (VANCOCIN) IVPB 1000 mg/200 mL premix        1,000 mg 200 mL/hr over 60 Minutes Intravenous Every 24 hours 01/20/23 1908     01/21/23 0000  ceFEPIme (MAXIPIME) 2 g in sodium chloride 0.9 % 100 mL IVPB        2 g 200 mL/hr over 30 Minutes Intravenous Every 12 hours 01/20/23 1908     01/20/23 1945  cefTRIAXone (ROCEPHIN) 1 g in sodium chloride 0.9 % 100 mL IVPB  Status:  Discontinued        1 g 200 mL/hr over 30 Minutes Intravenous Every 24 hours 01/20/23 1852 01/20/23 1908   01/20/23 1630  vancomycin (VANCOREADY) IVPB 1500 mg/300 mL        1,500 mg 150 mL/hr over 120 Minutes Intravenous  Once 01/20/23 1625 01/20/23 1916   01/20/23 1630  piperacillin-tazobactam (ZOSYN) IVPB 3.375 g        3.375 g 100 mL/hr over 30 Minutes Intravenous  Once 01/20/23 1625 01/20/23 1713       Subjective: C/o R chest discomfort radiating to L  Feels Richard Navarro little better after procedure, still feeling Richard Navarro bit sore  Objective: Vitals:   01/21/23 0834 01/21/23 0900 01/21/23 0920 01/21/23 1113  BP:  (!) 159/76 (!) 128/52 113/75  Pulse:    (!) 109  Resp:     20  Temp:    98.5 F (36.9 C)  TempSrc:    Oral  SpO2: 95%   100%  Weight:      Height:        Intake/Output Summary (Last 24 hours) at 01/21/2023 1133 Last data filed at 01/21/2023 0300 Gross per 24 hour  Intake 381.09 ml  Output --  Net 381.09 ml   Filed Weights   01/21/23 0143  Weight: 76.2 kg    Examination:  General exam: Appears calm and comfortable  Respiratory system: unlabored, diminished Cardiovascular system: RRR Gastrointestinal system: Abdomen is nondistended, soft and nontende Central nervous system: Alert and oriented. No focal neurological deficits. Extremities: no LEE    Data Reviewed: I have personally reviewed following labs and imaging studies  CBC: Recent Labs  Lab 01/20/23 1543 01/21/23 0517  WBC 10.8* 14.9*  NEUTROABS  --  13.4*  HGB 11.8* 12.7*  HCT 36.9* 40.4  MCV 93.9 96.4  PLT 258 251    Basic Metabolic Panel: Recent Labs  Lab 01/20/23 1543 01/20/23 1916 01/21/23 0517  NA 134*  --  134*  K 3.7  --  3.8  CL 103  --  106  CO2 22  --  17*  GLUCOSE 227*  --  208*  BUN 17  --  20  CREATININE 1.46* 1.46* 1.66*  CALCIUM 8.5*  --  8.7*    GFR: Estimated Creatinine Clearance: 39.5 mL/min (Richard Navarro) (by C-G formula based on SCr of 1.66 mg/dL (H)).  Liver Function Tests: Recent Labs  Lab 01/21/23 0517  AST 17  ALT 25  ALKPHOS 57  BILITOT 1.1  PROT 8.1  ALBUMIN 3.5    CBG: Recent Labs  Lab 01/21/23 0740  GLUCAP 206*     Recent Results (from the past 240 hour(s))  Blood culture (routine x 2)     Status: None (Preliminary result)   Collection Time: 01/20/23  5:17 PM   Specimen: BLOOD  Result Value Ref Range Status   Specimen Description   Final    BLOOD RIGHT ANTECUBITAL Performed at [facility], [address] [place], [place] [address]    Special Requests   Final    BOTTLES DRAWN AEROBIC AND ANAEROBIC Blood Culture adequate volume Performed at [facility], [address], [place], [place] [address]    Culture   Final    NO GROWTH < 24 HOURS Performed at [facility], [address] [place], [place] [address]    Report Status PENDING  Incomplete  Blood culture (routine x 2)     Status: None (Preliminary result)   Collection Time: 01/20/23  5:22 PM   Specimen: BLOOD  Result Value Ref Range Status   Specimen Description   Final    BLOOD LEFT ANTECUBITAL Performed at [facility], [address] [place], [place] [address]    Special Requests   Final  BOTTLES DRAWN AEROBIC AND ANAEROBIC Blood Culture adequate volume Performed at [facility], [address] [place], [place] [address]    Culture   Final    NO GROWTH < 24 HOURS Performed at [facility], 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 Request for diagnostic and therapeutic thoracentesis. EXAM: ULTRASOUND GUIDED RIGHT THORACENTESIS MEDICATIONS: 1% lidocaine 8 mL COMPLICATIONS: None immediate. PROCEDURE: An ultrasound guided thoracentesis was thoroughly discussed with the patient and questions answered. The benefits, risks, alternatives and complications were also discussed. The patient understands and wishes to proceed with the procedure. Written consent was obtained. Ultrasound was performed to localize and mark an adequate pocket of fluid in the right chest. The area was then prepped and draped in the normal sterile fashion. 1% Lidocaine was used for local anesthesia. Under ultrasound guidance Icela Glymph 6 Fr Safe-T-Centesis catheter was introduced. Thoracentesis was performed. The catheter was removed and Geraldy Akridge dressing applied. FINDINGS: Danyl Deems total of approximately 550 of cloudy yellow fluid was removed. Samples were sent to the laboratory as  requested by the clinical team. Pleural fluid was loculated on ultrasound. IMPRESSION: Successful ultrasound guided right thoracentesis yielding 550 of pleural fluid. No pneumothorax on post-procedure chest x-ray. Procedure performed by: Corrin Parker, PA-C Electronically Signed   By: Marliss Coots M.D.   On: 01/21/2023 10:16   DG Chest 1 View  Result Date: 01/21/2023 CLINICAL DATA:  Status post right thoracentesis. 550 mL of pleural fluid removed. EXAM: CHEST  1 VIEW COMPARISON:  01/20/2023. FINDINGS: Mild decrease in the right lung base opacity following thoracentesis. Residual opacity still obscures the hemidiaphragm. Mild chronic scarring at the right apex. Remainder of the lungs is clear. No convincing left pleural effusion. No pneumothorax. IMPRESSION: 1. Decreased opacity at the right lung base following thoracentesis. No pneumothorax or other procedure complication. Electronically Signed   By: Amie Portland M.D.   On: 01/21/2023 09:37   CT Angio Chest Pulmonary Embolism (PE) W or WO Contrast  Result Date: 01/20/2023 CLINICAL DATA:  Pulmonary embolus suspected with high probability. Right-sided chest pain and fever. EXAM: CT ANGIOGRAPHY CHEST WITH CONTRAST TECHNIQUE: Multidetector CT imaging of the chest was performed using the standard protocol during bolus administration of intravenous contrast. Multiplanar CT image reconstructions and MIPs were obtained to evaluate the vascular anatomy. RADIATION DOSE REDUCTION: This exam was performed according to the departmental dose-optimization program which includes automated exposure control, adjustment of the mA and/or kV according to patient size and/or use of iterative reconstruction technique. CONTRAST:  80mL OMNIPAQUE IOHEXOL 350 MG/ML SOLN COMPARISON:  Chest radiograph 01/20/2023.  CT chest 01/08/2023 FINDINGS: Cardiovascular: Technically adequate study with good opacification of the central and segmental pulmonary arteries. Loss of flow demonstrated in  the right lower lobe pulmonary arteries likely due to extrinsic compression from mass lesion and related treatment changes. Pulmonary arteries are otherwise well opacified. No findings to suggest acute pulmonary embolus. Mild cardiac enlargement. No pericardial effusions. Normal caliber thoracic aorta. Scattered aortic calcification. Coronary artery calcification. Mediastinum/Nodes: Esophagus is decompressed. Small esophageal hiatal hernia. Thyroid gland is unremarkable. Left aortopulmonic window lymph nodes are present measuring up to 11 mm in diameter. In the setting of prior malignancy these are indeterminate but without change since prior study. Lungs/Pleura: Severe emphysematous changes and scattered  fibrosis throughout the lungs. Right lower lobe/infrahilar mass with associated consolidation appears similar to prior study, corresponding to known malignancy and treatment changes. Additionally, there is an increasing right pleural effusion with increasing atelectasis or infiltration in the right lung base and with interval development of [place] possible loculated component of the effusion in the right upper lung. This could be due to superimposed pneumonia or possibly progression of neoplasm. Follow-up after treatment of the acute process is recommended. 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 Upper Abdomen: No acute process is identified. Musculoskeletal: Sclerosis in the midthoracic vertebral body, unchanged. This may indicate metastatic disease. Review of the MIP images confirms the above findings. IMPRESSION: 1. Right lower lobe/infrahilar mass with associated consolidation corresponds to known neoplasm and treatment changes. 2. The mass appears to cause extrinsic compression of some of the right lower lobe pulmonary arteries but there is no specific evidence for acute pulmonary embolus. 3. Since the previous study, there is an increasing right pleural effusion with developing  lobular component in the right upper chest and there is increasing infiltration in the right lung. These changes may represent progression of neoplasm or development of superimposed pneumonia. 4. Borderline enlarged lymph nodes in the left aortopulmonic window region are unchanged. 5. Sclerotic lesion in the midthoracic vertebra is unchanged. 6. Severe emphysematous changes and scattered fibrosis in the lungs. 7. Aortic atherosclerosis. Electronically Signed   By: Burman Nieves M.D.   On: 01/20/2023 18:16   DG Chest 2 View  Result Date: 01/20/2023 CLINICAL DATA:  Chest pain.  Fever. EXAM: CHEST - 2 VIEW COMPARISON:  November 22, 2022 FINDINGS: New infiltrate in the right base. No pneumothorax. No other interval changes. IMPRESSION: New infiltrate in the right base consistent with pneumonia. Recommend follow-up to resolution. Electronically Signed   By: Gerome Sam III M.D.   On: 01/20/2023 16:35        Scheduled Meds:  arformoterol  15 mcg Nebulization BID   And   umeclidinium bromide  1 puff Inhalation Daily   ascorbic acid  500 mg Oral Daily   cholecalciferol  1,000 Units Oral Daily   cyanocobalamin  1,000 mcg Oral Daily   enoxaparin (LOVENOX) injection  40 mg Subcutaneous Q24H   guaiFENesin  600 mg Oral BID   insulin aspart  0-9 Units Subcutaneous TID WC   lidocaine  1 patch Transdermal Q24H   metoprolol tartrate  12.5 mg Oral BID   pantoprazole  40 mg Oral BID   polyethylene glycol  17 g Oral Daily   Continuous Infusions:  ceFEPime (MAXIPIME) IV Stopped (01/20/23 2323)   vancomycin       LOS: 1 day    Time spent: over 30 min    Lacretia Nicks, MD [facility]   To contact the attending provider between 7A-7P or the covering provider during after hours 7P-7A, please log into the web site [email or web address] and access using universal [place] password for that web site. If you do not have the password, please call the hospital operator.  01/21/2023, 11:33 AM

## 2023-01-21 NOTE — Progress Notes (Signed)
PHARMACY - PHYSICIAN COMMUNICATION CRITICAL VALUE ALERT - BLOOD CULTURE IDENTIFICATION (BCID)  Richard Navarro is an 77 y.o. male who presented to [facility] on 01/20/2023 with pneumonia.  Assessment: + BCID = Staph species in 1 out of 4 bottles (no resistance detected); likely contaminant  Name of physician (or Provider) Contacted: Dr Lowell Guitar  Current antibiotics: Vancomycin and Cefepime  Changes to prescribed antibiotics recommended:  Patient is on recommended antibiotics - No changes needed  Results for orders placed or performed during the hospital encounter of 01/20/23  Blood Culture ID Panel (Reflexed) (Collected: 01/20/2023  5:22 PM)  Result Value Ref Range   Enterococcus faecalis NOT DETECTED NOT DETECTED   Enterococcus Faecium NOT DETECTED NOT DETECTED   Listeria monocytogenes NOT DETECTED NOT DETECTED   Staphylococcus species DETECTED (A) NOT DETECTED   Staphylococcus aureus (BCID) NOT DETECTED NOT DETECTED   Staphylococcus epidermidis NOT DETECTED NOT DETECTED   Staphylococcus lugdunensis NOT DETECTED NOT DETECTED   Streptococcus species NOT DETECTED NOT DETECTED   Streptococcus agalactiae NOT DETECTED NOT DETECTED   Streptococcus pneumoniae NOT DETECTED NOT DETECTED   Streptococcus pyogenes NOT DETECTED NOT DETECTED   A.calcoaceticus-baumannii NOT DETECTED NOT DETECTED   Bacteroides fragilis NOT DETECTED NOT DETECTED   Enterobacterales NOT DETECTED NOT DETECTED   Enterobacter cloacae complex NOT DETECTED NOT DETECTED   Escherichia coli NOT DETECTED NOT DETECTED   Klebsiella aerogenes NOT DETECTED NOT DETECTED   Klebsiella oxytoca NOT DETECTED NOT DETECTED   Klebsiella pneumoniae NOT DETECTED NOT DETECTED   Proteus species NOT DETECTED NOT DETECTED   Salmonella species NOT DETECTED NOT DETECTED   Serratia marcescens NOT DETECTED NOT DETECTED   Haemophilus influenzae NOT DETECTED NOT DETECTED   Neisseria meningitidis NOT DETECTED NOT DETECTED   Pseudomonas aeruginosa  NOT DETECTED NOT DETECTED   Stenotrophomonas maltophilia NOT DETECTED NOT DETECTED   Candida albicans NOT DETECTED NOT DETECTED   Candida auris NOT DETECTED NOT DETECTED   Candida glabrata NOT DETECTED NOT DETECTED   Candida krusei NOT DETECTED NOT DETECTED   Candida parapsilosis NOT DETECTED NOT DETECTED   Candida tropicalis NOT DETECTED NOT DETECTED   Cryptococcus neoformans/gattii NOT DETECTED NOT DETECTED    Maryellen Pile, PharmD 01/21/2023  3:27 PM

## 2023-01-22 ENCOUNTER — Telehealth: Payer: Self-pay | Admitting: Medical Oncology

## 2023-01-22 ENCOUNTER — Inpatient Hospital Stay (HOSPITAL_COMMUNITY): Payer: Medicare Other

## 2023-01-22 DIAGNOSIS — J189 Pneumonia, unspecified organism: Secondary | ICD-10-CM | POA: Diagnosis not present

## 2023-01-22 LAB — CBC WITH DIFFERENTIAL/PLATELET
Abs Immature Granulocytes: 0.17 10*3/uL — ABNORMAL HIGH (ref 0.00–0.07)
Basophils Absolute: 0.1 10*3/uL (ref 0.0–0.1)
Basophils Relative: 1 %
Eosinophils Absolute: 0.1 10*3/uL (ref 0.0–0.5)
Eosinophils Relative: 1 %
HCT: 35.6 % — ABNORMAL LOW (ref 39.0–52.0)
Hemoglobin: 11.1 g/dL — ABNORMAL LOW (ref 13.0–17.0)
Immature Granulocytes: 1 %
Lymphocytes Relative: 4 %
Lymphs Abs: 0.5 10*3/uL — ABNORMAL LOW (ref 0.7–4.0)
MCH: 30.3 pg (ref 26.0–34.0)
MCHC: 31.2 g/dL (ref 30.0–36.0)
MCV: 97.3 fL (ref 80.0–100.0)
Monocytes Absolute: 0.7 10*3/uL (ref 0.1–1.0)
Monocytes Relative: 6 %
Neutro Abs: 10.5 10*3/uL — ABNORMAL HIGH (ref 1.7–7.7)
Neutrophils Relative %: 87 %
Platelets: 209 10*3/uL (ref 150–400)
RBC: 3.66 MIL/uL — ABNORMAL LOW (ref 4.22–5.81)
RDW: 15.4 % (ref 11.5–15.5)
WBC: 12.1 10*3/uL — ABNORMAL HIGH (ref 4.0–10.5)
nRBC: 0 % (ref 0.0–0.2)

## 2023-01-22 LAB — GLUCOSE, CAPILLARY
Glucose-Capillary: 156 mg/dL — ABNORMAL HIGH (ref 70–99)
Glucose-Capillary: 180 mg/dL — ABNORMAL HIGH (ref 70–99)
Glucose-Capillary: 162 mg/dL — ABNORMAL HIGH (ref 70–99)
Glucose-Capillary: 231 mg/dL — ABNORMAL HIGH (ref 70–99)

## 2023-01-22 LAB — COMPREHENSIVE METABOLIC PANEL
ALT: 19 U/L (ref 0–44)
AST: 14 U/L — ABNORMAL LOW (ref 15–41)
Albumin: 2.9 g/dL — ABNORMAL LOW (ref 3.5–5.0)
Alkaline Phosphatase: 54 U/L (ref 38–126)
Anion gap: 8 (ref 5–15)
BUN: 21 mg/dL (ref 8–23)
CO2: 18 mmol/L — ABNORMAL LOW (ref 22–32)
Calcium: 8.3 mg/dL — ABNORMAL LOW (ref 8.9–10.3)
Chloride: 106 mmol/L (ref 98–111)
Creatinine, Ser: 1.58 mg/dL — ABNORMAL HIGH (ref 0.61–1.24)
GFR, Estimated: 44 mL/min — ABNORMAL LOW (ref >60)
Glucose, Bld: 160 mg/dL — ABNORMAL HIGH (ref 70–99)
Potassium: 3.3 mmol/L — ABNORMAL LOW (ref 3.5–5.1)
Sodium: 132 mmol/L — ABNORMAL LOW (ref 135–145)
Total Bilirubin: 0.9 mg/dL (ref 0.3–1.2)
Total Protein: 7.4 g/dL (ref 6.5–8.1)

## 2023-01-22 LAB — MAGNESIUM: Magnesium: 1.9 mg/dL (ref 1.7–2.4)

## 2023-01-22 LAB — CULTURE, BLOOD (ROUTINE X 2)
Special Requests: ADEQUATE
Special Requests: ADEQUATE

## 2023-01-22 LAB — STREP PNEUMONIAE URINARY ANTIGEN: Strep Pneumo Urinary Antigen: NEGATIVE

## 2023-01-22 LAB — BODY FLUID CULTURE W GRAM STAIN: Culture: NO GROWTH

## 2023-01-22 MED ORDER — POLYETHYLENE GLYCOL 3350 17 G PO PACK
17.0000 g | PACK | Freq: Two times a day (BID) | ORAL | Status: DC
  Administered 2023-01-22 – 2023-01-27 (×9): 17 g via ORAL
  Filled 2023-01-22 (×9): qty 1

## 2023-01-22 MED ORDER — ALLOPURINOL 100 MG PO TABS
100.0000 mg | ORAL_TABLET | Freq: Every day | ORAL | Status: DC
  Administered 2023-01-22 – 2023-01-27 (×6): 100 mg via ORAL
  Filled 2023-01-22 (×6): qty 1

## 2023-01-22 MED ORDER — INSULIN ASPART 100 UNIT/ML IJ SOLN
1.0000 [IU] | Freq: Once | INTRAMUSCULAR | Status: AC
  Administered 2023-01-22: 1 [IU] via SUBCUTANEOUS

## 2023-01-22 NOTE — Plan of Care (Signed)

## 2023-01-22 NOTE — Evaluation (Signed)
Physical Therapy Evaluation Patient Details Name: Richard Navarro MRN: [id number] DOB: Apr 03, 1946 Today's Date: 01/22/2023  History of Present Illness  Pt is 77 yo male admitted 01/20/23 with HCAP and R pleural effusion.  He is s/p R thoracentesis on 01/22/23. Pt with hx including but not limited to DM2, CKD, resp failure on 2 L at home, neuroendocrine carcinoma, lung mass with liver metastasis currently on chemo.  Clinical Impression  Pt admitted with above diagnosis. At baseline, pt is independent.  Today, pt able to ambulate 15' in room with min guard and cues for safety.  Distance limited by shortness of breath.  He was on 4 L with stable O2 sats.  Pt had some c/o upper chest pain with coughing.  Pt expected to progress well with therapy and be able to return home from a mobility perspective at d/c.  Pt currently with functional limitations due to the deficits listed below (see PT Problem List). Pt will benefit from acute skilled PT to increase their independence and safety with mobility to allow discharge.          Recommendations for follow up therapy are one component of a multi-disciplinary discharge planning process, led by the attending physician.  Recommendations may be updated based on patient status, additional functional criteria and insurance authorization.  Follow Up Recommendations       Assistance Recommended at Discharge Intermittent Supervision/Assistance  Patient can return home with the following  A little help with walking and/or transfers;Assistance with cooking/housework;A little help with bathing/dressing/bathroom;Help with stairs or ramp for entrance    Equipment Recommendations Rolling walker (2 wheels)  Recommendations for Other Services       Functional Status Assessment Patient has had a recent decline in their functional status and demonstrates the ability to make significant improvements in function in a reasonable and predictable amount of time.      Precautions / Restrictions Precautions Precautions: Fall      Mobility  Bed Mobility Overal bed mobility: Needs Assistance Bed Mobility: Supine to Sit     Supine to sit: Supervision          Transfers Overall transfer level: Needs assistance Equipment used: Rolling walker (2 wheels) Transfers: Sit to/from Stand Sit to Stand: Min guard           General transfer comment: min guard for safety    Ambulation/Gait Ambulation/Gait assistance: Min guard Gait Distance (Feet): 15 Feet Assistive device: Rolling walker (2 wheels) Gait Pattern/deviations: Step-through pattern, Decreased stride length Gait velocity: decreased     General Gait Details: Mildly impulsive requiring cues for safety; distance limited by shortness of breath  Stairs            Wheelchair Mobility    Modified Rankin (Stroke Patients Only)       Balance Overall balance assessment: Needs assistance Sitting-balance support: No upper extremity supported Sitting balance-Leahy Scale: Good     Standing balance support: No upper extremity supported, Bilateral upper extremity supported Standing balance-Leahy Scale: Fair Standing balance comment: RW to ambulate but could stand without support                             Pertinent Vitals/Pain Pain Assessment Pain Assessment: 0-10 Pain Location: across upper chest -gets aggravated with coughing, sneezing, etc Pain Descriptors / Indicators: Discomfort, Sharp Pain Intervention(s): Limited activity within patient's tolerance, Monitored during session, Repositioned, Relaxation, RN gave pain meds during session  Home Living Family/patient expects to be discharged to:: Private residence Living Arrangements: Spouse/significant other Available Help at Discharge: Family;Available 24 hours/day Type of Home: House Home Access: Stairs to enter   Entrance Stairs-Number of Steps: 1 Alternate Level Stairs-Number of Steps: flight Home  Layout: Able to live on main level with bedroom/bathroom;Two level Home Equipment: Shower seat Additional Comments: 2L/min at home, grab bars, reacher    Prior Function Prior Level of Function : Independent/Modified Independent             Mobility Comments: ambulates in community without AD       Hand Dominance   Dominant Hand: Right    Extremity/Trunk Assessment   Upper Extremity Assessment Upper Extremity Assessment: Overall WFL for tasks assessed    Lower Extremity Assessment Lower Extremity Assessment: Overall WFL for tasks assessed    Cervical / Trunk Assessment Cervical / Trunk Assessment: Kyphotic  Communication   Communication: No difficulties  Cognition Arousal/Alertness: Awake/alert Behavior During Therapy: Anxious Overall Cognitive Status: Within Functional Limits for tasks assessed                                 General Comments: Overall WFL, pt did need redirecting at times to focus on task at hand.        General Comments General comments (skin integrity, edema, etc.): Pt on 4 L O2.  With coughing and activity pt with increased respiratory rate (35-40) requiring cues for relaxation and breathing in nose.  Sats remained 96% or >    Exercises     Assessment/Plan    PT Assessment Patient needs continued PT services  PT Problem List Decreased strength;Cardiopulmonary status limiting activity;Pain;Decreased range of motion;Decreased activity tolerance;Decreased balance;Decreased mobility;Decreased knowledge of precautions;Decreased knowledge of use of DME       PT Treatment Interventions DME instruction;Therapeutic exercise;Gait training;Stair training;Functional mobility training;Therapeutic activities;Patient/family education;Modalities    PT Goals (Current goals can be found in the Care Plan section)  Acute Rehab PT Goals Patient Stated Goal: return home PT Goal Formulation: With patient/family Time For Goal Achievement:  02/05/23 Potential to Achieve Goals: Good    Frequency Min 1X/week     Co-evaluation PT/OT/SLP Co-Evaluation/Treatment: Yes Reason for Co-Treatment: Complexity of the patient's impairments (multi-system involvement);For patient/therapist safety PT goals addressed during session: Mobility/safety with mobility         AM-PAC PT "6 Clicks" Mobility  Outcome Measure Help needed turning from your back to your side while in a flat bed without using bedrails?: None Help needed moving from lying on your back to sitting on the side of a flat bed without using bedrails?: A Little Help needed moving to and from a bed to a chair (including a wheelchair)?: A Little Help needed standing up from a chair using your arms (e.g., wheelchair or bedside chair)?: A Little Help needed to walk in hospital room?: A Little Help needed climbing 3-5 steps with a railing? : A Little 6 Click Score: 19    End of Session Equipment Utilized During Treatment: Oxygen Activity Tolerance: Patient tolerated treatment well Patient left: with chair alarm set;in chair;with call bell/phone within reach Nurse Communication: Mobility status PT Visit Diagnosis: Other abnormalities of gait and mobility (R26.89);Muscle weakness (generalized) (M62.81)    Time: 4098-1191 PT Time Calculation (min) (ACUTE ONLY): 23 min   Charges:   PT Evaluation $PT Eval Low Complexity: 1 Low  Anise Salvo, PT Acute Rehab [facility] [phone]   Rayetta Humphrey 01/22/2023, 3:01 PM

## 2023-01-22 NOTE — TOC Initial Note (Signed)
Transition of Care [facility]) - Initial/Assessment Note    Patient Details  Name: Richard Navarro MRN: [id number] Date of Birth: [birth year]  [facility] CM/SW Contact:    Otelia Santee, LCSW Phone Number: 01/22/2023, 11:48 AM  Clinical Narrative:                 Pt from home. Currently uses 2L O2 at baseline. TOC will continue to follow for discharge planning needs.   Expected Discharge Plan: Home/Self Care Barriers to Discharge: Continued Medical Work up   Patient Goals and CMS Choice Patient states their goals for this hospitalization and ongoing recovery are:: To return home CMS Medicare.gov Compare Post Acute Care list provided to:: Patient Choice offered to / list presented to : Patient [place] ownership interest in [facility]provided to:: Patient    Expected Discharge Plan and Services In-house Referral: NA Discharge Planning Services: NA Post Acute Care Choice: NA Living arrangements for the past 2 months: Apartment                 DME Arranged: N/A DME Agency: NA                  Prior Living Arrangements/Services Living arrangements for the past 2 months: Apartment Lives with:: Significant Other Patient language and need for interpreter reviewed:: Yes Do you feel safe going back to the place where you live?: Yes      Need for Family Participation in Patient Care: No (Comment) Care giver support system in place?: Yes (comment) Current home services: DME (2L home O2) Criminal Activity/Legal Involvement Pertinent to Current Situation/Hospitalization: No - Comment as needed  Activities of Daily Living Home Assistive Devices/Equipment: Walker (specify type) ADL Screening (condition at time of admission) Patient's cognitive ability adequate to safely complete daily activities?: Yes Is the patient deaf or have difficulty hearing?: No Does the patient have difficulty seeing, even when wearing glasses/contacts?: No Does the patient  have difficulty concentrating, remembering, or making decisions?: No Patient able to express need for assistance with ADLs?: Yes Does the patient have difficulty dressing or bathing?: No Independently performs ADLs?: Yes (appropriate for developmental age) Does the patient have difficulty walking or climbing stairs?: Yes Weakness of Legs: Both Weakness of Arms/Hands: None  Permission Sought/Granted   Permission granted to share information with : No              Emotional Assessment   Attitude/Demeanor/Rapport: Unable to Assess Affect (typically observed): Unable to Assess Orientation: : Oriented to Self, Oriented to Place, Oriented to  Time, Oriented to Situation Alcohol / Substance Use: Not Applicable Psych Involvement: No (comment)  Admission diagnosis:  Pneumonia [J18.9] HCAP (healthcare-associated pneumonia) [J18.9] Patient Active Problem List   Diagnosis Date Noted   HCAP (healthcare-associated pneumonia) 01/20/2023   Depressive disorder 10/24/2022   Chronic rhinitis 10/19/2022   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   Chronic hypoxemic respiratory failure (HCC) 08/04/2021   Dysphagia    Encounter for colorectal cancer screening    Prolonged QT interval 04/17/2021   Sepsis (HCC) 04/16/2021   Alcohol abuse 10/26/2020   Benign neoplasm of colon 10/26/2020   Carpal tunnel syndrome 10/26/2020   Family history of malignant neoplasm of gastrointestinal tract 10/26/2020   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   Tobacco use disorder 10/26/2020   Type II diabetes mellitus with peripheral circulatory disorder (HCC) 09/21/2020   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) 02/17/2020  Sepsis due to pneumonia (HCC) 01/26/2020   Recurrent major depressive disorder, 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   Thrombocytopenia (HCC)    Anemia 05/07/2018   Chronic kidney disease (CKD) stage G3b/A1, moderately decreased glomerular filtration rate (GFR)  between 30-44 mL/min/1.73 square meter and albuminuria creatinine ratio less than 30 mg/g (HCC) 03/04/2018   Myalgia due to statin 03/04/2018   Lobar pneumonia (HCC) 08/09/2017   Neuroendocrine carcinoma (HCC) 08/09/2017   Encounter for antineoplastic chemotherapy 01/10/2017   Goals of care, counseling/discussion 01/10/2017   Primary cancer of right lung (HCC) 12/22/2016   COPD (chronic obstructive pulmonary disease) (HCC) 12/01/2016   PTSD (post-traumatic stress disorder) 12/07/2015   Prostate cancer screening 04/17/2014   Erectile dysfunction 04/09/2014   Arthritis 03/26/2014   History of adenomatous polyp of colon 03/26/2014   Former smoker 03/26/2014   Diabetes mellitus with renal manifestation (HCC)    Essential hypertension    Gout    Depression    GERD (gastroesophageal reflux disease)    Hyperlipidemia    PCP:  Shelva Majestic, MD Pharmacy:   [facility] [address] [place] [address] Phone: [phone] Fax: [phone]  [facility] - [place], [place] - [address] [place] [address] Phone: [phone] Fax: [phone]     Social Determinants of Health (SDOH) Social History: SDOH Screenings   Food Insecurity: No Food Insecurity (11/23/2022)  Housing: Patient Declined (11/23/2022)  Transportation Needs: No Transportation Needs (11/23/2022)  Utilities: Not At Risk (11/23/2022)  Depression (PHQ2-9): High Risk (11/27/2022)  Financial Resource Strain: Low Risk  (11/23/2022)  Physical Activity: Inactive (11/23/2022)  Social Connections: Socially Isolated (11/23/2022)  Stress: No Stress Concern Present (11/23/2022)  Tobacco Use: Medium Risk (01/20/2023)   SDOH Interventions:     Readmission Risk Interventions    01/22/2023   11:45 AM  Readmission Risk Prevention Plan  Transportation Screening Complete  PCP or Specialist Appt within 5-7 Days Complete  Home Care Screening Complete   Medication Review (RN CM) Complete

## 2023-01-22 NOTE — Evaluation (Signed)
Occupational Therapy Evaluation Patient Details Name: Richard Navarro MRN: [id number] DOB: Apr 09, 1946 Today's Date: 01/22/2023   History of Present Illness Pt is 77 yo male admitted 01/20/23 with HCAP and R pleural effusion.  He is s/p R thoracentesis on 01/22/23. Pt with hx including but not limited to DM2, CKD, resp failure on 2 L at home, neuroendocrine carcinoma, lung mass with liver metastasis currently on chemo.   Clinical Impression   Patient is a 77 year old male who was admitted for above. Patient was living at home with 2L/min supplemental O2 independently with no AD. Currently, patient needs RW and 4L/min to maintain O2 saturations with increased SOB and pain noted with minimal movements. Patient was noted to have decreased functional activity tolerance, decreased endurance, decreased standing balance, decreased safety awareness, and decreased knowledge of AD/AE impacting participation in ADLs. Patient would continue to benefit from skilled OT services at this time while admitted and after d/c to address noted deficits in order to improve overall safety and independence in ADLs.       Recommendations for follow up therapy are one component of a multi-disciplinary discharge planning process, led by the attending physician.  Recommendations may be updated based on patient status, additional functional criteria and insurance authorization.   Assistance Recommended at Discharge Frequent or constant Supervision/Assistance  Patient can return home with the following A little help with walking and/or transfers;A lot of help with bathing/dressing/bathroom;Assistance with cooking/housework;Direct supervision/assist for medications management;Help with stairs or ramp for entrance;Direct supervision/assist for financial management;Assist for transportation    Functional Status Assessment  Patient has had a recent decline in their functional status and demonstrates the ability to make significant  improvements in function in a reasonable and predictable amount of time.  Equipment Recommendations  None recommended by OT       Precautions / Restrictions Precautions Precautions: Fall Precaution Comments: monitor O2, on 2L/min at home Restrictions Weight Bearing Restrictions: No      Mobility Bed Mobility Overal bed mobility: Needs Assistance Bed Mobility: Supine to Sit     Supine to sit: Min assist, HOB elevated     General bed mobility comments: with increased time and LOB to the R side    Transfers Overall transfer level: Needs assistance Equipment used: Rolling walker (2 wheels) Transfers: Sit to/from Stand Sit to Stand: Min assist           General transfer comment: with increased unsteadiness with transition      Balance Overall balance assessment: Needs assistance Sitting-balance support: No upper extremity supported Sitting balance-Leahy Scale: Good     Standing balance support: Bilateral upper extremity supported, Reliant on assistive device for balance Standing balance-Leahy Scale: Poor           ADL either performed or assessed with clinical judgement   ADL Overall ADL's : Needs assistance/impaired Eating/Feeding: Sitting;Supervision/ safety Eating/Feeding Details (indicate cue type and reason): to take small sips of water with pain medication provided by nurse. Grooming: Set up;Sitting   Upper Body Bathing: Minimal assistance;Sitting   Lower Body Bathing: Sit to/from stand;Sitting/lateral leans;Maximal assistance Lower Body Bathing Details (indicate cue type and reason): unsteady with standing needing RW to maintain balance Upper Body Dressing : Sitting;Moderate assistance Upper Body Dressing Details (indicate cue type and reason): patient needed mod A to maintain balance sitting EOB and engage in donning PJ button up top Lower Body Dressing: Maximal assistance;Sitting/lateral leans;Sit to/from stand   Toilet Transfer: Minimal  assistance;Ambulation;Rolling walker (2 wheels) Toilet  Transfer Details (indicate cue type and reason): with increased SOB with minimal movement on 4L/min 96% with heavy dyspnea. patient noted to have a tangent on how this was all an accumulation of decisions he made in life. patient noted to become agitated with suggestion about speaking about things that were bottled up. nurse made aware. Toileting- Clothing Manipulation and Hygiene: Sit to/from stand;Maximal assistance                Pertinent Vitals/Pain Pain Assessment Pain Assessment: 0-10 Pain Score: 8  Pain Location: across upper chest -gets aggravated with coughing, sneezing, etc Pain Descriptors / Indicators: Discomfort, Sharp Pain Intervention(s): Limited activity within patient's tolerance, Monitored during session, Repositioned, RN gave pain meds during session     Hand Dominance Right   Extremity/Trunk Assessment Upper Extremity Assessment Upper Extremity Assessment: Overall WFL for tasks assessed   Lower Extremity Assessment Lower Extremity Assessment: Defer to PT evaluation   Cervical / Trunk Assessment Cervical / Trunk Assessment: Kyphotic   Communication Communication Communication: No difficulties   Cognition Arousal/Alertness: Awake/alert Behavior During Therapy: Anxious Overall Cognitive Status: Within Functional Limits for tasks assessed       General Comments: Overall WFL, pt did need redirecting at times to focus on task at hand.     General Comments  Pt on 4 L O2.  With coughing and activity pt with increased respiratory rate (35-40) requiring cues for relaxation and breathing in nose.  Sats remained 96% or >            Home Living Family/patient expects to be discharged to:: Private residence Living Arrangements: Spouse/significant other Available Help at Discharge: Family;Available 24 hours/day Type of Home: House Home Access: Stairs to enter [employer] of Steps: 1    Home Layout: Able to live on main level with bedroom/bathroom;Two level Alternate Level Stairs-Number of Steps: flight   Bathroom Shower/Tub: Walk-in shower         Home Equipment: Shower seat   Additional Comments: 2L/min at home, grab bars, reacher      Prior Functioning/Environment Prior Level of Function : Independent/Modified Independent             Mobility Comments: ambulates in community without AD          OT Problem List: Decreased strength;Decreased activity tolerance;Impaired balance (sitting and/or standing);Decreased coordination;Decreased safety awareness;Decreased knowledge of precautions;Decreased knowledge of use of DME or AE;Cardiopulmonary status limiting activity      OT Treatment/Interventions: Self-care/ADL training;Energy conservation;Therapeutic exercise;DME and/or AE instruction;Therapeutic activities;Patient/family education;Balance training    OT Goals(Current goals can be found in the care plan section) Acute Rehab OT Goals Patient Stated Goal: to go home OT Goal Formulation: With patient Time For Goal Achievement: 02/05/23 Potential to Achieve Goals: Fair  OT Frequency: Min 1X/week    Co-evaluation PT/OT/SLP Co-Evaluation/Treatment: Yes Reason for Co-Treatment: Complexity of the patient's impairments (multi-system involvement);For patient/therapist safety PT goals addressed during session: Mobility/safety with mobility OT goals addressed during session: ADL's and self-care      AM-PAC OT "6 Clicks" Daily Activity     Outcome Measure Help from another person eating meals?: None Help from another person taking care of personal grooming?: None Help from another person toileting, which includes using toliet, bedpan, or urinal?: A Little Help from another person bathing (including washing, rinsing, drying)?: A Little Help from another person to put on and taking off regular upper body clothing?: A Little Help from another person to put  on and taking off regular  lower body clothing?: A Little 6 Click Score: 20   End of Session Equipment Utilized During Treatment: Gait belt;Rolling walker (2 wheels) Nurse Communication: Other (comment) (patient reports during session)  Activity Tolerance: Patient limited by fatigue Patient left: in chair;with call bell/phone within reach;with chair alarm set;with family/visitor present  OT Visit Diagnosis: Unsteadiness on feet (R26.81);Other abnormalities of gait and mobility (R26.89);Pain                Time: 1352-1418 OT Time Calculation (min): 26 min Charges:  OT General Charges $OT Visit: 1 Visit OT Evaluation $OT Eval Low Complexity: 1 Low  Bernyce Brimley OTR/L, MS Acute Rehabilitation Department Office# [phone]   Selinda Flavin 01/22/2023, 3:39 PM

## 2023-01-22 NOTE — Plan of Care (Signed)
Problem: Nutrition: Goal: Adequate nutrition will be maintained 01/22/2023 0126 by Jolene Provost, RN Outcome: Progressing 01/22/2023 0125 by Jolene Provost, RN Outcome: Progressing   Problem: Education: Goal: Ability to describe self-care measures that may prevent or decrease complications (Diabetes Survival Skills Education) will improve 01/22/2023 0126 by Jolene Provost, RN Outcome: Progressing 01/22/2023 0125 by Jolene Provost, RN Outcome: Progressing Goal: Individualized Educational Video(s) 01/22/2023 0126 by Jolene Provost, RN Outcome: Progressing 01/22/2023 0125 by Jolene Provost, RN Outcome: Progressing   Problem: Coping: Goal: Ability to adjust to condition or change in health will improve 01/22/2023 0126 by Jolene Provost, RN Outcome: Progressing 01/22/2023 0125 by Jolene Provost, RN Outcome: Progressing   Problem: Fluid Volume: Goal: Ability to maintain a balanced intake and output will improve 01/22/2023 0126 by Jolene Provost, RN Outcome: Progressing 01/22/2023 0125 by Jolene Provost, RN Outcome: Progressing   Problem: Health Behavior/Discharge Planning: Goal: Ability to identify and utilize available resources and services will improve 01/22/2023 0126 by Jolene Provost, RN Outcome: Progressing 01/22/2023 0125 by Jolene Provost, RN Outcome: Progressing Goal: Ability to manage health-related needs will improve 01/22/2023 0126 by Jolene Provost, RN Outcome: Progressing 01/22/2023 0125 by Jolene Provost, RN Outcome: Progressing   Problem: Metabolic: Goal: Ability to maintain appropriate glucose levels will improve 01/22/2023 0126 by Jolene Provost, RN Outcome: Progressing 01/22/2023 0125 by Jolene Provost, RN Outcome: Progressing   Problem: Nutritional: Goal: Maintenance of adequate nutrition will improve 01/22/2023 0126 by Jolene Provost, RN Outcome: Progressing 01/22/2023 0125 by Jolene Provost, RN Outcome: Progressing   Problem: Skin Integrity: Goal: Risk for  impaired skin integrity will decrease 01/22/2023 0126 by Jolene Provost, RN Outcome: Progressing 01/22/2023 0125 by Jolene Provost, RN Outcome: Progressing   Problem: Tissue Perfusion: Goal: Adequacy of tissue perfusion will improve 01/22/2023 0126 by Jolene Provost, RN Outcome: Progressing 01/22/2023 0125 by Jolene Provost, RN Outcome: Progressing   Problem: Education: Goal: Knowledge of General Education information will improve Description: Including pain rating scale, medication(s)/side effects and non-pharmacologic comfort measures 01/22/2023 0126 by Jolene Provost, RN Outcome: Progressing 01/22/2023 0125 by Jolene Provost, RN Outcome: Progressing   Problem: Health Behavior/Discharge Planning: Goal: Ability to manage health-related needs will improve 01/22/2023 0126 by Jolene Provost, RN Outcome: Progressing 01/22/2023 0125 by Jolene Provost, RN Outcome: Progressing   Problem: Clinical Measurements: Goal: Ability to maintain clinical measurements within normal limits will improve 01/22/2023 0126 by Jolene Provost, RN Outcome: Progressing 01/22/2023 0125 by Jolene Provost, RN Outcome: Progressing Goal: Will remain free from infection 01/22/2023 0126 by Jolene Provost, RN Outcome: Progressing 01/22/2023 0125 by Jolene Provost, RN Outcome: Progressing Goal: Diagnostic test results will improve 01/22/2023 0126 by Jolene Provost, RN Outcome: Progressing 01/22/2023 0125 by Jolene Provost, RN Outcome: Progressing Goal: Respiratory complications will improve 01/22/2023 0126 by Jolene Provost, RN Outcome: Progressing 01/22/2023 0125 by Jolene Provost, RN Outcome: Progressing Goal: Cardiovascular complication will be avoided 01/22/2023 0126 by Jolene Provost, RN Outcome: Progressing 01/22/2023 0125 by Jolene Provost, RN Outcome: Progressing   Problem: Activity: Goal: Risk for activity intolerance will decrease 01/22/2023 0126 by Jolene Provost, RN Outcome: Progressing 01/22/2023 0125  by Jolene Provost, RN Outcome: Progressing   Problem: Nutrition: Goal: Adequate nutrition will be maintained 01/22/2023 0126 by Jolene Provost, RN Outcome: Progressing 01/22/2023 0125 by Jolene Provost, RN Outcome: Progressing  Problem: Coping: Goal: Level of anxiety will decrease 01/22/2023 0126 by Jolene Provost, RN Outcome: Progressing 01/22/2023 0125 by Jolene Provost, RN Outcome: Progressing   Problem: Elimination: Goal: Will not experience complications related to bowel motility 01/22/2023 0126 by Jolene Provost, RN Outcome: Progressing 01/22/2023 0125 by Jolene Provost, RN Outcome: Progressing Goal: Will not experience complications related to urinary retention 01/22/2023 0126 by Jolene Provost, RN Outcome: Progressing 01/22/2023 0125 by Jolene Provost, RN Outcome: Progressing   Problem: Pain Managment: Goal: General experience of comfort will improve 01/22/2023 0126 by Jolene Provost, RN Outcome: Progressing 01/22/2023 0125 by Jolene Provost, RN Outcome: Progressing   Problem: Safety: Goal: Ability to remain free from injury will improve 01/22/2023 0126 by Jolene Provost, RN Outcome: Progressing 01/22/2023 0125 by Jolene Provost, RN Outcome: Progressing   Problem: Skin Integrity: Goal: Risk for impaired skin integrity will decrease 01/22/2023 0126 by Jolene Provost, RN Outcome: Progressing 01/22/2023 0125 by Jolene Provost, RN Outcome: Progressing

## 2023-01-22 NOTE — Progress Notes (Signed)
Brief PCCM Progress Note  # Loculated right exudative pleural effusion - continue ABX - will discuss pigtail vs thora vs expectant mgmt with long course of ABX with pt tomorrow AM  [facility]

## 2023-01-22 NOTE — Progress Notes (Signed)
Patient refuse Telemetry box , MD is aware.

## 2023-01-22 NOTE — Progress Notes (Signed)
Mobility Specialist - Progress Note   01/22/23 1042  Mobility  Activity Transferred from bed to chair  Level of Assistance Standby assist, set-up cues, supervision of patient - no hands on  Assistive Device None  Distance Ambulated (ft) 4 ft  Range of Motion/Exercises Active  Activity Response Tolerated well  Mobility Referral Yes  $Mobility charge 1 Mobility  Mobility Specialist Start Time (ACUTE ONLY) 1017  Mobility Specialist Stop Time (ACUTE ONLY) 1027  Mobility Specialist Time Calculation (min) (ACUTE ONLY) 10 min   Pt received in bed and agreed to mobility, upon standing pt had chest pain, notified RN.  Pt sat down, stood up again with no A.   Pt returned to chair with all needs met RN in room.  Marilynne Halsted Mobility Specialist

## 2023-01-22 NOTE — Telephone Encounter (Signed)
Admitted with pleural effusion -Waiting on cytology . Treating for pneumonia Hospitalist requested that [facility]  consult  pt.

## 2023-01-22 NOTE — Progress Notes (Signed)
PROGRESS NOTE    Richard Navarro  ZOX:[id number] DOB: [birth year] DOA: 01/20/2023 PCP: Shelva Majestic, MD  Chief Complaint  Patient presents with   Chest Pain   Fever    Brief Narrative:   Richard Navarro is Richard Navarro 77 y.o. 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  Admitted for pneumonia and R effusion.  Assessment & Plan:   Principal Problem:   HCAP (healthcare-associated pneumonia) Active Problems:   Diabetes mellitus with renal manifestation (HCC)   COPD (chronic obstructive pulmonary disease) (HCC)   Lobar pneumonia (HCC)   Neuroendocrine carcinoma (HCC)  Acute on Chronic Hypoxic Respiratory Failure  Exudative Right Pleural Effusion  Community Acquired Pneumonia - fits sepsis criteria with tachycardia, leukocytosis, tachypnea - on 3-4 L, typically on 2 L at home (tachycardic, leukocytosis) - CT chest with increasing R pleural effusion with developing lobular component in right upper chest and increasing infiltration in the right lung (concern for progression of neoplasm vs development of superimposed pneumonia) -> he's noted some infectious symptoms over the past week, concerning for infectious cause - cefepime - MRSA, urine strep, urine legionella, sputum culture pending collection - blood cultures 1/4 with staph species (awaiting final results)  - s/p R thora with 550 cc hazy fluid removed -> exudative by lights, gram stain with no organisms.  Culture NG < 24 hrs.  Cytology pending.  Cell count with 8208 nucleated cells, 93% neutrophils    Staph in blood cultures Unclear significance, possible contaminant, will follow  COPD/ chronic hypoxic respiratory failure on 2liters at home No wheezing on exam, no acute hypoxia continue home meds, continue o2 2liters   NIDDM2, controlled Recent A1c 6.1 [place] home oral meds, start ssi while inpatient - will adjust prn  Carb modified diet   CKDIIIa Cr close to baseline, renal dosing meds   stage IV low-grade neuroendocrine carcinoma Xeloda and temodar every 4 weeks , His treatment has been on hold since March 2024 due to persistent fatigue and cough productive of thick sputum F/u with oncology Dr Arbutus Ped  Will discuss 6/3    DVT prophylaxis: lovenox Code Status: full Family Communication: none Disposition:   Status is: Inpatient Remains inpatient appropriate because: need for continued w/u   Consultants:  IR Oncology will be consulted 6/3  Procedures:  thoracentesis  Antimicrobials:  Anti-infectives (From admission, onward)    Start     Dose/Rate Route Frequency Ordered Stop   01/21/23 1800  vancomycin (VANCOCIN) IVPB 1000 mg/200 mL premix        1,000 mg 200 mL/hr over 60 Minutes Intravenous Every 24 hours 01/20/23 1908     01/21/23 0000  ceFEPIme (MAXIPIME) 2 g in sodium chloride 0.9 % 100 mL IVPB        2 g 200 mL/hr over 30 Minutes Intravenous Every 12 hours 01/20/23 1908     01/20/23 1945  cefTRIAXone (ROCEPHIN) 1 g in sodium chloride 0.9 % 100 mL IVPB  Status:  Discontinued        1 g 200 mL/hr over 30 Minutes Intravenous Every 24 hours 01/20/23 1852 01/20/23 1908  01/20/23 1630  vancomycin (VANCOREADY) IVPB 1500 mg/300 mL        1,500 mg 150 mL/hr over 120 Minutes Intravenous  Once 01/20/23 1625 01/20/23 1916   01/20/23 1630  piperacillin-tazobactam (ZOSYN) IVPB 3.375 g        3.375 g 100 mL/hr over 30 Minutes Intravenous  Once 01/20/23 1625 01/20/23 1713       Subjective: Chest discomfort better, but still present   Objective: Vitals:    01/21/23 2206 01/22/23 0523 01/22/23 0751 01/22/23 0752  BP: 123/73 138/83    Pulse: 96  (!) 115   Resp: 20 18    Temp: 98.1 F (36.7 C) 99.3 F (37.4 C)    TempSrc: Oral Oral    SpO2: 95% (!) 87% 99% 99%  Weight:      Height:        Intake/Output Summary (Last 24 hours) at 01/22/2023 1114 Last data filed at 01/22/2023 4098 Gross per 24 hour  Intake 602.05 ml  Output 350 ml  Net 252.05 ml   Filed Weights   01/21/23 0143  Weight: 76.2 kg    Examination:  General: No acute distress. Cardiovascular: tachycardia  Lungs: tachycardia, diminished, 4 L [place] Abdomen: Soft, nontender, nondistended Neurological: Alert and oriented 3. Moves all extremities 4 with equal strength. Cranial nerves II through XII grossly intact. Extremities: No clubbing or cyanosis. No edema.   Data Reviewed: I have personally reviewed following labs and imaging studies  CBC: Recent Labs  Lab 01/20/23 1543 01/21/23 0517 01/22/23 0537  WBC 10.8* 14.9* 12.1*  NEUTROABS  --  13.4* 10.5*  HGB 11.8* 12.7* 11.1*  HCT 36.9* 40.4 35.6*  MCV 93.9 96.4 97.3  PLT 258 251 209    Basic Metabolic Panel: Recent Labs  Lab 01/20/23 1543 01/20/23 1916 01/21/23 0517 01/22/23 0537  NA 134*  --  134* 132*  K 3.7  --  3.8 3.3*  CL 103  --  106 106  CO2 22  --  17* 18*  GLUCOSE 227*  --  208* 160*  BUN 17  --  20 21  CREATININE 1.46* 1.46* 1.66* 1.58*  CALCIUM 8.5*  --  8.7* 8.3*  MG  --   --   --  1.9    GFR: Estimated Creatinine Clearance: 41.5 mL/min (Sayvon Arterberry) (by C-G formula based on SCr of 1.58 mg/dL (H)).  Liver Function Tests: Recent Labs  Lab 01/21/23 0517 01/22/23 0537  AST 17 14*  ALT 25 19  ALKPHOS 57 54  BILITOT 1.1 0.9  PROT 8.1 7.4  ALBUMIN 3.5 2.9*    CBG: Recent Labs  Lab 01/21/23 1204 01/21/23 1301 01/21/23 1638 01/21/23 2203 01/22/23 0733  GLUCAP 306* 305* 179* 221* 156*     Recent Results (from the past 240 hour(s))  MRSA Next Gen by PCR, Nasal     Status: None    Collection Time: 01/20/23  4:55 PM   Specimen: Nasal Mucosa; Nasal Swab  Result Value Ref Range Status   MRSA by PCR Next Gen NOT DETECTED NOT DETECTED Final    Comment: (NOTE) The GeneXpert MRSA Assay (FDA approved for NASAL specimens only), is one component of Jerardo Costabile comprehensive MRSA colonization surveillance program. It is not intended to diagnose MRSA infection nor to guide or monitor treatment for MRSA infections. Test performance is not FDA approved in patients less than 20 years old. Performed at [facility], [address] [place], [place] [address]   Blood culture (routine x  2)     Status: None (Preliminary result)   Collection Time: 01/20/23  5:17 PM   Specimen: BLOOD  Result Value Ref Range Status   Specimen Description   Final    BLOOD RIGHT ANTECUBITAL Performed at [facility], [address] [place], [place] [address]    Special Requests   Final    BOTTLES DRAWN AEROBIC AND ANAEROBIC Blood Culture adequate volume Performed at [facility], [address], [place], [place] [address]    Culture   Final    NO GROWTH 2 DAYS Performed at [facility], [address] [place], [place] [address]    Report Status PENDING  Incomplete  Blood culture (routine x 2)     Status: None (Preliminary result)   Collection Time: 01/20/23  5:22 PM   Specimen: BLOOD  Result Value Ref Range Status   Specimen Description   Final    BLOOD LEFT ANTECUBITAL Performed at [facility], [address], [place], [place] [address]    Special Requests   Final    BOTTLES DRAWN AEROBIC AND ANAEROBIC Blood Culture adequate volume Performed at [facility], [address] [place], [place] [address]    Culture  Setup Time   Final    GRAM POSITIVE COCCI IN CLUSTERS AEROBIC BOTTLE ONLY CRITICAL RESULT CALLED TO, READ BACK BY AND VERIFIED WITH: PHARMD Terrilee Files [id number] BY J  RAZZAK,MT Performed at [facility], [address] [place], [place] [address]    Culture GRAM POSITIVE COCCI  Final   Report Status PENDING  Incomplete  Blood Culture ID Panel (Reflexed)     Status: Abnormal   Collection Time: 01/20/23  5:22 PM  Result Value Ref Range Status   Enterococcus faecalis NOT DETECTED NOT DETECTED Final   Enterococcus Faecium NOT DETECTED NOT DETECTED Final   Listeria monocytogenes NOT DETECTED NOT DETECTED Final   Staphylococcus species DETECTED (Duncan Alejandro) NOT DETECTED Final    Comment: CRITICAL RESULT CALLED TO, READ BACK BY AND VERIFIED WITH: PHARMD LEANN POINDEXTER [id number] BY J RAZZAK, MT    Staphylococcus aureus (BCID) NOT DETECTED NOT DETECTED Final   Staphylococcus epidermidis NOT DETECTED NOT DETECTED Final   Staphylococcus lugdunensis NOT DETECTED NOT DETECTED Final   Streptococcus species NOT DETECTED NOT DETECTED Final   Streptococcus agalactiae NOT DETECTED NOT DETECTED Final   Streptococcus pneumoniae NOT DETECTED NOT DETECTED Final   Streptococcus pyogenes NOT DETECTED NOT DETECTED Final   Alyssah Algeo.calcoaceticus-baumannii NOT DETECTED NOT DETECTED Final   Bacteroides fragilis NOT DETECTED NOT DETECTED Final   Enterobacterales NOT DETECTED NOT DETECTED Final   Enterobacter cloacae complex NOT DETECTED NOT DETECTED Final   Escherichia coli NOT DETECTED NOT DETECTED Final   Klebsiella aerogenes NOT DETECTED NOT DETECTED Final   Klebsiella oxytoca NOT DETECTED NOT DETECTED Final   Klebsiella pneumoniae NOT DETECTED NOT DETECTED Final   Proteus species NOT DETECTED NOT DETECTED Final   Salmonella species NOT DETECTED NOT DETECTED Final   Serratia marcescens NOT DETECTED NOT DETECTED Final   Haemophilus influenzae NOT DETECTED NOT DETECTED Final   Neisseria meningitidis NOT DETECTED NOT DETECTED Final   Pseudomonas aeruginosa NOT DETECTED NOT DETECTED Final   Stenotrophomonas maltophilia NOT DETECTED NOT DETECTED Final   Candida albicans NOT  DETECTED NOT DETECTED Final   Candida auris NOT DETECTED NOT DETECTED Final   Candida glabrata NOT DETECTED NOT DETECTED Final   Candida krusei NOT DETECTED NOT  DETECTED Final   Candida parapsilosis NOT DETECTED NOT DETECTED Final   Candida tropicalis NOT DETECTED NOT DETECTED Final   Cryptococcus neoformans/gattii NOT DETECTED NOT DETECTED Final    Comment: Performed at [facility], [address] [place], [place] [address]  Body fluid culture w Gram Stain     Status: None (Preliminary result)   Collection Time: 01/21/23  9:27 AM   Specimen: PATH Cytology Pleural fluid  Result Value Ref Range Status   Specimen Description   Final    PLEURAL Performed at [facility], [address] [place], [place] [address]    Special Requests   Final    NONE Performed at [facility], [address], [place], [place] [address]    Gram Stain   Final    FEW WBC PRESENT, PREDOMINANTLY PMN NO ORGANISMS SEEN    Culture   Final    NO GROWTH < 24 HOURS Performed at [facility], [address] [place], [place] [address]    Report Status PENDING  Incomplete         Radiology Studies: DG CHEST PORT 1 VIEW  Result Date: 01/22/2023 CLINICAL DATA:  Shortness of breath. EXAM: PORTABLE CHEST 1 VIEW COMPARISON:  01/21/2023 FINDINGS: Slightly increased densities at the right lung base compatible with pleural fluid and consolidation/airspace disease. Heart size is upper limits of normal but stable. Trachea is midline. Negative for Brinley Treanor pneumothorax. IMPRESSION: Slightly increased densities at the right lung base compatible with pleural fluid and airspace disease. Electronically Signed   By: Richarda Overlie M.D.   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 Request for diagnostic and therapeutic thoracentesis. EXAM: ULTRASOUND GUIDED  RIGHT THORACENTESIS MEDICATIONS: 1% lidocaine 8 mL COMPLICATIONS: None immediate. PROCEDURE: An ultrasound guided thoracentesis was thoroughly discussed with the patient and questions answered. The benefits, risks, alternatives and complications were also discussed. The patient understands and wishes to proceed with the procedure. Written consent was obtained. Ultrasound was performed to localize and mark an adequate pocket of fluid in the right chest. The area was then prepped and draped in the normal sterile fashion. 1% Lidocaine was used for local anesthesia. Under ultrasound guidance Mickell Birdwell 6 Fr Safe-T-Centesis catheter was introduced. Thoracentesis was performed. The catheter was removed and Brystal Kildow dressing applied. FINDINGS: Lamont Glasscock total of approximately 550 of cloudy yellow fluid was removed. Samples were sent to the laboratory as requested by the clinical team. Pleural fluid was loculated on ultrasound. IMPRESSION: Successful ultrasound guided right thoracentesis yielding 550 of pleural fluid. No pneumothorax on post-procedure chest x-ray. Procedure performed by: Corrin Parker, PA-C Electronically Signed   By: Marliss Coots M.D.   On: 01/21/2023 10:16   DG Chest 1 View  Result Date: 01/21/2023 CLINICAL DATA:  Status post right thoracentesis. 550 mL of pleural fluid removed. EXAM: CHEST  1 VIEW COMPARISON:  01/20/2023. FINDINGS: Mild decrease in the right lung base opacity following thoracentesis. Residual opacity still obscures the hemidiaphragm. Mild chronic scarring at the right apex. Remainder of the lungs is clear. No convincing left pleural effusion. No pneumothorax. IMPRESSION: 1. Decreased opacity at the right lung base following thoracentesis. No pneumothorax or other procedure complication. Electronically Signed   By: Amie Portland M.D.   On: 01/21/2023 09:37   CT Angio Chest Pulmonary Embolism (PE) W or WO Contrast  Result Date: 01/20/2023 CLINICAL DATA:  Pulmonary embolus suspected with high probability.  Right-sided chest pain and fever. EXAM: CT ANGIOGRAPHY CHEST WITH CONTRAST TECHNIQUE: Multidetector CT imaging of the chest was performed using the standard protocol during bolus administration of intravenous contrast. Multiplanar CT image reconstructions and MIPs were obtained to evaluate the vascular anatomy. RADIATION DOSE REDUCTION: This exam was performed according to the departmental dose-optimization program which includes automated exposure control, adjustment of the mA and/or kV according to patient size and/or use of iterative reconstruction technique. CONTRAST:  80mL OMNIPAQUE IOHEXOL 350 MG/ML SOLN COMPARISON:  Chest radiograph 01/20/2023.  CT chest 01/08/2023 FINDINGS: Cardiovascular: Technically adequate study with good opacification of the central and segmental pulmonary arteries. Loss of flow demonstrated in the right lower lobe pulmonary arteries likely due to extrinsic compression from mass lesion and related treatment changes. Pulmonary arteries are otherwise well opacified. No findings to suggest acute pulmonary embolus. Mild cardiac enlargement. No pericardial effusions. Normal caliber thoracic aorta. Scattered aortic calcification. Coronary artery calcification. Mediastinum/Nodes: Esophagus is decompressed. Small esophageal hiatal hernia. Thyroid gland is unremarkable. Left aortopulmonic window lymph nodes are present measuring up to 11 mm in diameter. In the setting of prior malignancy these are indeterminate but without change since prior study. Lungs/Pleura: Severe emphysematous changes and scattered fibrosis throughout the lungs. Right lower lobe/infrahilar mass with associated consolidation appears similar to prior study, corresponding to known malignancy and treatment changes. Additionally, there is an increasing right pleural effusion with increasing atelectasis or infiltration in the right lung base and with interval development of Ardyth Kelso possible loculated component of the effusion in the  right upper lung. This could be due to superimposed pneumonia or possibly progression of neoplasm. Follow-up after treatment of the acute process is recommended. 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 Upper Abdomen: No acute process is identified. Musculoskeletal: Sclerosis in the midthoracic vertebral body, unchanged. This may indicate metastatic disease. Review of the MIP images confirms the above findings. IMPRESSION: 1. Right lower lobe/infrahilar mass with associated consolidation corresponds to known neoplasm and treatment changes. 2. The mass appears to cause extrinsic compression of some of the right lower lobe pulmonary arteries but there is no specific evidence for acute pulmonary embolus. 3. Since the previous study, there is an increasing right pleural effusion with developing lobular component in the right upper chest and there is increasing infiltration in the right lung. These changes may represent progression of neoplasm or development of superimposed pneumonia. 4. Borderline enlarged lymph nodes in the left aortopulmonic window region are unchanged. 5. Sclerotic lesion in the midthoracic vertebra is unchanged. 6. Severe emphysematous changes and scattered fibrosis in the lungs. 7. Aortic atherosclerosis. Electronically Signed   By: Burman Nieves M.D.   On: 01/20/2023 18:16   DG Chest 2 View  Result Date: 01/20/2023 CLINICAL DATA:  Chest pain.  Fever. EXAM: CHEST - 2 VIEW COMPARISON:  November 22, 2022 FINDINGS: New infiltrate in the right base. No pneumothorax. No other interval changes. IMPRESSION: New infiltrate in the right base consistent with pneumonia. Recommend follow-up to resolution. Electronically Signed   By: Gerome Sam III M.D.   On: 01/20/2023 16:35        Scheduled Meds:  allopurinol  100 mg Oral Daily   arformoterol  15 mcg Nebulization BID   And   umeclidinium bromide  1 puff Inhalation Daily   ascorbic acid  500 mg Oral Daily    cholecalciferol  1,000 Units Oral Daily   cyanocobalamin  1,000 mcg Oral Daily  enoxaparin (LOVENOX) injection  40 mg Subcutaneous Q24H   guaiFENesin  600 mg Oral BID   insulin aspart  0-9 Units Subcutaneous TID WC   lidocaine  1 patch Transdermal Q24H   metoprolol tartrate  12.5 mg Oral BID   pantoprazole  40 mg Oral BID   polyethylene glycol  17 g Oral Daily   Continuous Infusions:  ceFEPime (MAXIPIME) IV Stopped (01/22/23 0016)   vancomycin Stopped (01/21/23 1938)     LOS: 2 days    Time spent: over 30 min    Lacretia Nicks, MD [facility]   To contact the attending provider between 7A-7P or the covering provider during after hours 7P-7A, please log into the web site [email or web address] and access using universal [place] password for that web site. If you do not have the password, please call the hospital operator.  01/22/2023, 11:14 AM

## 2023-01-23 ENCOUNTER — Inpatient Hospital Stay (HOSPITAL_COMMUNITY): Payer: Medicare Other

## 2023-01-23 ENCOUNTER — Ambulatory Visit: Payer: Medicare Other | Admitting: Family Medicine

## 2023-01-23 DIAGNOSIS — J90 Pleural effusion, not elsewhere classified: Secondary | ICD-10-CM | POA: Diagnosis not present

## 2023-01-23 DIAGNOSIS — J189 Pneumonia, unspecified organism: Secondary | ICD-10-CM | POA: Diagnosis not present

## 2023-01-23 LAB — COMPREHENSIVE METABOLIC PANEL
ALT: 34 U/L (ref 0–44)
AST: 33 U/L (ref 15–41)
Albumin: 2.8 g/dL — ABNORMAL LOW (ref 3.5–5.0)
Alkaline Phosphatase: 68 U/L (ref 38–126)
Anion gap: 9 (ref 5–15)
BUN: 21 mg/dL (ref 8–23)
CO2: 18 mmol/L — ABNORMAL LOW (ref 22–32)
Calcium: 8.7 mg/dL — ABNORMAL LOW (ref 8.9–10.3)
Chloride: 105 mmol/L (ref 98–111)
Creatinine, Ser: 1.62 mg/dL — ABNORMAL HIGH (ref 0.61–1.24)
GFR, Estimated: 43 mL/min — ABNORMAL LOW (ref >60)
Glucose, Bld: 210 mg/dL — ABNORMAL HIGH (ref 70–99)
Potassium: 3.8 mmol/L (ref 3.5–5.1)
Sodium: 132 mmol/L — ABNORMAL LOW (ref 135–145)
Total Bilirubin: 0.5 mg/dL (ref 0.3–1.2)
Total Protein: 7.7 g/dL (ref 6.5–8.1)

## 2023-01-23 LAB — CBC WITH DIFFERENTIAL/PLATELET
Abs Immature Granulocytes: 0.26 10*3/uL — ABNORMAL HIGH (ref 0.00–0.07)
Basophils Absolute: 0 10*3/uL (ref 0.0–0.1)
Basophils Relative: 0 %
Eosinophils Absolute: 0.1 10*3/uL (ref 0.0–0.5)
Eosinophils Relative: 1 %
HCT: 34.3 % — ABNORMAL LOW (ref 39.0–52.0)
Hemoglobin: 11 g/dL — ABNORMAL LOW (ref 13.0–17.0)
Immature Granulocytes: 2 %
Lymphocytes Relative: 3 %
Lymphs Abs: 0.4 10*3/uL — ABNORMAL LOW (ref 0.7–4.0)
MCH: 30.1 pg (ref 26.0–34.0)
MCHC: 32.1 g/dL (ref 30.0–36.0)
MCV: 94 fL (ref 80.0–100.0)
Monocytes Absolute: 0.7 10*3/uL (ref 0.1–1.0)
Monocytes Relative: 6 %
Neutro Abs: 12 10*3/uL — ABNORMAL HIGH (ref 1.7–7.7)
Neutrophils Relative %: 88 %
Platelets: 247 10*3/uL (ref 150–400)
RBC: 3.65 MIL/uL — ABNORMAL LOW (ref 4.22–5.81)
RDW: 15.5 % (ref 11.5–15.5)
WBC: 13.6 10*3/uL — ABNORMAL HIGH (ref 4.0–10.5)
nRBC: 0 % (ref 0.0–0.2)

## 2023-01-23 LAB — BODY FLUID CULTURE W GRAM STAIN

## 2023-01-23 LAB — GLUCOSE, CAPILLARY
Glucose-Capillary: 222 mg/dL — ABNORMAL HIGH (ref 70–99)
Glucose-Capillary: 165 mg/dL — ABNORMAL HIGH (ref 70–99)
Glucose-Capillary: 200 mg/dL — ABNORMAL HIGH (ref 70–99)
Glucose-Capillary: 269 mg/dL — ABNORMAL HIGH (ref 70–99)

## 2023-01-23 LAB — CREATININE, SERUM
Creatinine, Ser: 1.59 mg/dL — ABNORMAL HIGH (ref 0.61–1.24)
GFR, Estimated: 44 mL/min — ABNORMAL LOW (ref >60)

## 2023-01-23 LAB — LEGIONELLA PNEUMOPHILA SEROGP 1 UR AG: L. pneumophila Serogp 1 Ur Ag: NEGATIVE

## 2023-01-23 MED ORDER — SODIUM CHLORIDE 0.9% FLUSH
10.0000 mL | Freq: Three times a day (TID) | INTRAVENOUS | Status: DC
  Administered 2023-01-23 – 2023-01-24 (×4): 10 mL via INTRAPLEURAL

## 2023-01-23 MED ORDER — FENTANYL CITRATE PF 50 MCG/ML IJ SOSY
PREFILLED_SYRINGE | INTRAMUSCULAR | Status: AC
  Administered 2023-01-23: 50 ug via INTRAVENOUS
  Filled 2023-01-23: qty 1

## 2023-01-23 MED ORDER — FENTANYL CITRATE PF 50 MCG/ML IJ SOSY: 50.0000 ug | PREFILLED_SYRINGE | Freq: Once | INTRAMUSCULAR | Status: AC

## 2023-01-23 MED ORDER — SODIUM CHLORIDE 0.9 % IV SOLN
3.0000 g | Freq: Four times a day (QID) | INTRAVENOUS | Status: DC
  Administered 2023-01-24 – 2023-01-27 (×15): 3 g via INTRAVENOUS
  Filled 2023-01-23 (×16): qty 8

## 2023-01-23 NOTE — Progress Notes (Addendum)
PROGRESS NOTE    Richard Navarro  ZOX:[id number] DOB: 1946/01/30 DOA: 01/20/2023 PCP: Shelva Majestic, MD  Chief Complaint  Patient presents with   Chest Pain   Fever    Brief Narrative:   Richard Navarro is Richard Navarro 77 y.o. 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  Admitted for pneumonia and R effusion.  Pulmonary following.    Assessment & Plan:   Principal Problem:   HCAP (healthcare-associated pneumonia) Active Problems:   Diabetes mellitus with renal manifestation (HCC)   COPD (chronic obstructive pulmonary disease) (HCC)   Lobar pneumonia (HCC)   Neuroendocrine carcinoma (HCC)   Pleural effusion exudative  Acute on Chronic Hypoxic Respiratory Failure  Exudative Right Pleural Effusion  Community Acquired Pneumonia - fits sepsis criteria with tachycardia, leukocytosis, tachypnea - on 4 L, typically on 2 L at home (tachycardic, leukocytosis) - CT chest with increasing R pleural effusion with developing lobular component in right upper chest and increasing infiltration in the right lung (concern for progression of neoplasm vs development of superimposed pneumonia) -> he's noted some infectious symptoms over the past week, concerning for infectious cause - repeat CT chest with R posterior lobe consolidation and mass with increasing consolidation compared with prior study, R pleural effusion mildly increased, diffuse emphysematous changes in lungs, nonspecific mediastinal LAD, small pericardial effusion, small esophageal hiatal hernia, small  bochdalek's left diaphragmatic hernia - cefepime  - MRSA negative, urine strep negative, urine legionella, sputum culture pending collection - appreciate pulmonary recs -> slp, esophagram, consider pigtail  - s/p R thora with 550 cc hazy fluid removed -> exudative by lights, gram stain with no organisms.  Culture NG 48 hrs.  Cytology pending.  Cell count with 8208 nucleated cells, 93% neutrophils   Acute Metabolic Encephalopathy - suspect related to acute illness, pain meds - seems mild - delirium precautions, w/u additionally if needed  Staph Capitis in blood cultures This is likely Richard Navarro contaminant  COPD/ chronic hypoxic respiratory failure on 2liters at home No wheezing on exam, no acute hypoxia continue home meds, continue o2 2liters   NIDDM2, controlled Recent A1c 6.1 [place] home oral meds, start ssi while inpatient - will adjust prn  Carb modified diet   CKDIIIa Cr close to baseline, renal dosing meds   stage IV low-grade neuroendocrine carcinoma Xeloda and temodar every 4 weeks , His treatment has been on hold since March 2024 due to persistent fatigue and cough productive of thick sputum F/u with oncology Dr Arbutus Ped, he's aware of his admission, discussed on 6/4    DVT prophylaxis: lovenox Code Status: full Family Communication: none Disposition:   Status is: Inpatient Remains inpatient appropriate because: need for continued w/u   Consultants:  IR Oncology will be consulted 6/3  Procedures:  thoracentesis  Antimicrobials:  Anti-infectives (From admission, onward)    Start     Dose/Rate Route Frequency Ordered Stop   01/21/23 1800  vancomycin (VANCOCIN) IVPB 1000 mg/200 mL premix        1,000 mg 200 mL/hr over 60 Minutes Intravenous Every 24 hours 01/20/23 1908     01/21/23  0000  ceFEPIme (MAXIPIME) 2 g in sodium chloride 0.9 % 100 mL IVPB        2 g 200 mL/hr over 30 Minutes Intravenous Every 12 hours 01/20/23 1908     01/20/23 1945  cefTRIAXone (ROCEPHIN)  1 g in sodium chloride 0.9 % 100 mL IVPB  Status:  Discontinued        1 g 200 mL/hr over 30 Minutes Intravenous Every 24 hours 01/20/23 1852 01/20/23 1908   01/20/23 1630  vancomycin (VANCOREADY) IVPB 1500 mg/300 mL        1,500 mg 150 mL/hr over 120 Minutes Intravenous  Once 01/20/23 1625 01/20/23 1916   01/20/23 1630  piperacillin-tazobactam (ZOSYN) IVPB 3.375 g        3.375 g 100 mL/hr over 30 Minutes Intravenous  Once 01/20/23 1625 01/20/23 1713       Subjective: Continued chest discomfort  Objective: Vitals:   01/23/23 0303 01/23/23 0808 01/23/23 1251 01/23/23 1300  BP: (!) 149/92  (!) 177/91 (!) 143/71  Pulse: (!) 106  (!) 40 (!) 111  Resp: 20  20   Temp: 98.8 F (37.1 C)  98 F (36.7 C)   TempSrc: Oral  Oral   SpO2: 91% 92% (!) 77% 96%  Weight:      Height:        Intake/Output Summary (Last 24 hours) at 01/23/2023 1312 Last data filed at 01/23/2023 1254 Gross per 24 hour  Intake 556 ml  Output 700 ml  Net -144 ml   Filed Weights   01/21/23 0143  Weight: 76.2 kg    Examination:  General: No acute distress. Cardiovascular: RRR Lungs: Clear to auscultation bilaterally  Abdomen: Soft, nontender, nondistended Neurological: Alert, disoriented. Moves all extremities 4 . Cranial nerves II through XII intact. Extremities: No clubbing or cyanosis. No edema.   Data Reviewed: I have personally reviewed following labs and imaging studies  CBC: Recent Labs  Lab 01/20/23 1543 01/21/23 0517 01/22/23 0537 01/23/23 0852  WBC 10.8* 14.9* 12.1* 13.6*  NEUTROABS  --  13.4* 10.5* 12.0*  HGB 11.8* 12.7* 11.1* 11.0*  HCT 36.9* 40.4 35.6* 34.3*  MCV 93.9 96.4 97.3 94.0  PLT 258 251 209 247    Basic Metabolic Panel: Recent Labs  Lab 01/20/23 1543 01/20/23 1916 01/21/23 0517 01/22/23 0537 01/23/23 0524 01/23/23 0852  NA 134*  --  134* 132*  --  132*  K 3.7  --  3.8 3.3*  --  3.8  CL 103  --  106 106  --  105  CO2 22  --  17* 18*  --  18*  GLUCOSE 227*  --   208* 160*  --  210*  BUN 17  --  20 21  --  21  CREATININE 1.46* 1.46* 1.66* 1.58* 1.59* 1.62*  CALCIUM 8.5*  --  8.7* 8.3*  --  8.7*  MG  --   --   --  1.9  --   --     GFR: Estimated Creatinine Clearance: 40.5 mL/min (Alekhya Gravlin) (by C-G formula based on SCr of 1.62 mg/dL (H)).  Liver Function Tests: Recent Labs  Lab 01/21/23 0517 01/22/23 0537 01/23/23 0852  AST 17 14* 33  ALT 25 19 34  ALKPHOS 57 54 68  BILITOT 1.1 0.9 0.5  PROT 8.1 7.4 7.7  ALBUMIN 3.5 2.9* 2.8*    CBG: Recent Labs  Lab 01/22/23 1115 01/22/23 1702 01/22/23 2047 01/23/23 0740 01/23/23 1136  GLUCAP 180* 162* 231* 222*  165*     Recent Results (from the past 240 hour(s))  MRSA Next Gen by PCR, Nasal     Status: None   Collection Time: 01/20/23  4:55 PM   Specimen: Nasal Mucosa; Nasal Swab  Result Value Ref Range Status   MRSA by PCR Next Gen NOT DETECTED NOT DETECTED Final    Comment: (NOTE) The GeneXpert MRSA Assay (FDA approved for NASAL specimens only), is one component of Nason Conradt comprehensive MRSA colonization surveillance program. It is not intended to diagnose MRSA infection nor to guide or monitor treatment for MRSA infections. Test performance is not FDA approved in patients less than 5 years old. Performed at [facility], [address] [place], [place] [address]   Blood culture (routine x 2)     Status: None (Preliminary result)   Collection Time: 01/20/23  5:17 PM   Specimen: BLOOD  Result Value Ref Range Status   Specimen Description   Final    BLOOD RIGHT ANTECUBITAL Performed at [facility], [address] [place], [place] [address]    Special Requests   Final    BOTTLES DRAWN AEROBIC AND ANAEROBIC Blood Culture adequate volume Performed at [facility], [address] [place], [place] [address]    Culture   Final    NO GROWTH 2 DAYS Performed at [facility], [address], [place], [place] [address]    Report  Status PENDING  Incomplete  Blood culture (routine x 2)     Status: Abnormal   Collection Time: 01/20/23  5:22 PM   Specimen: BLOOD  Result Value Ref Range Status   Specimen Description   Final    BLOOD LEFT ANTECUBITAL Performed at [facility], [address], [place], [place] [address]    Special Requests   Final    BOTTLES DRAWN AEROBIC AND ANAEROBIC Blood Culture adequate volume Performed at [facility], [address], [place], [place] [address]    Culture  Setup Time   Final    GRAM POSITIVE COCCI IN CLUSTERS AEROBIC BOTTLE ONLY CRITICAL RESULT CALLED TO, READ BACK BY AND VERIFIED WITH: PHARMD LEANN POINDEXTER [id number] 1511 BY J RAZZAK,MT    Culture (Anysia Choi)  Final    STAPHYLOCOCCUS CAPITIS THE SIGNIFICANCE OF ISOLATING THIS ORGANISM FROM Rashea [place]kie SINGLE SET OF BLOOD CULTURES WHEN MULTIPLE SETS ARE DRAWN IS UNCERTAIN. PLEASE NOTIFY THE MICROBIOLOGY DEPARTMENT WITHIN ONE WEEK IF SPECIATION AND SENSITIVITIES ARE REQUIRED. Performed at [facility], [address] [place], [place] [address]    Report Status 01/22/2023 FINAL  Final  Blood Culture ID Panel (Reflexed)     Status: Abnormal   Collection Time: 01/20/23  5:22 PM  Result Value Ref Range Status   Enterococcus faecalis NOT DETECTED NOT DETECTED Final   Enterococcus Faecium NOT DETECTED NOT DETECTED Final   Listeria monocytogenes NOT DETECTED NOT DETECTED Final   Staphylococcus species DETECTED (Saga Balthazar) NOT DETECTED Final    Comment: CRITICAL RESULT CALLED TO, READ BACK BY AND VERIFIED WITH: PHARMD LEANN POINDEXTER [id number] 1511 BY J RAZZAK, MT    Staphylococcus aureus (BCID) NOT DETECTED NOT DETECTED Final   Staphylococcus epidermidis NOT DETECTED NOT DETECTED Final   Staphylococcus lugdunensis NOT DETECTED NOT DETECTED Final   Streptococcus species NOT DETECTED NOT DETECTED Final   Streptococcus agalactiae NOT DETECTED NOT DETECTED Final   Streptococcus pneumoniae NOT DETECTED NOT  DETECTED Final   Streptococcus pyogenes NOT DETECTED NOT DETECTED  Final   Nichola Warren.calcoaceticus-baumannii NOT DETECTED NOT DETECTED Final   Bacteroides fragilis NOT DETECTED NOT DETECTED Final   Enterobacterales NOT DETECTED NOT DETECTED Final   Enterobacter cloacae complex NOT DETECTED NOT DETECTED Final   Escherichia coli NOT DETECTED NOT DETECTED Final   Klebsiella aerogenes NOT DETECTED NOT DETECTED Final   Klebsiella oxytoca NOT DETECTED NOT DETECTED Final   Klebsiella pneumoniae NOT DETECTED NOT DETECTED Final   Proteus species NOT DETECTED NOT DETECTED Final   Salmonella species NOT DETECTED NOT DETECTED Final   Serratia marcescens NOT DETECTED NOT DETECTED Final   Haemophilus influenzae NOT DETECTED NOT DETECTED Final   Neisseria meningitidis NOT DETECTED NOT DETECTED Final   Pseudomonas aeruginosa NOT DETECTED NOT DETECTED Final   Stenotrophomonas maltophilia NOT DETECTED NOT DETECTED Final   Candida albicans NOT DETECTED NOT DETECTED Final   Candida auris NOT DETECTED NOT DETECTED Final   Candida glabrata NOT DETECTED NOT DETECTED Final   Candida krusei NOT DETECTED NOT DETECTED Final   Candida parapsilosis NOT DETECTED NOT DETECTED Final   Candida tropicalis NOT DETECTED NOT DETECTED Final   Cryptococcus neoformans/gattii NOT DETECTED NOT DETECTED Final    Comment: Performed at [facility], [address], [place], [place] [address]  Body fluid culture w Gram Stain     Status: None (Preliminary result)   Collection Time: 01/21/23  9:27 AM   Specimen: PATH Cytology Pleural fluid  Result Value Ref Range Status   Specimen Description   Final    PLEURAL Performed at [facility], [address], [place], [place] [address]    Special Requests   Final    NONE Performed at [facility], [address] [place], [place] [address]    Gram Stain   Final    FEW WBC PRESENT, PREDOMINANTLY PMN NO ORGANISMS SEEN    Culture   Final    NO  GROWTH 2 DAYS Performed at [facility], [address] [place], [place] [address]    Report Status PENDING  Incomplete         Radiology Studies: CT CHEST WO CONTRAST  Result Date: 01/22/2023 CLINICAL DATA:  Pneumonia with complication suspected.  X-ray done. EXAM: CT CHEST WITHOUT CONTRAST TECHNIQUE: Multidetector CT imaging of the chest was performed following the standard protocol without IV contrast. RADIATION DOSE REDUCTION: This exam was performed according to the departmental dose-optimization program which includes automated exposure control, adjustment of the mA and/or kV according to patient size and/or use of iterative reconstruction technique. COMPARISON:  Chest radiograph 01/22/2023.  CT chest 01/20/2023 FINDINGS: Cardiovascular: Normal heart size. Small pericardial effusion. Normal caliber thoracic aorta. Calcification of the aorta and coronary arteries. Mediastinum/Nodes: Thyroid gland is unremarkable. Enlarged mediastinal lymph nodes are present with pretracheal nodes measuring up to 11 mm short axis dimension. Lymphadenopathy is similar to prior study. Changes are nonspecific but could be reactive or metastatic. Esophagus is decompressed. Lungs/Pleura: Persistent finding of mass and consolidation in the right lower lobe causing invasion or eccentric compression of the right lower lobe bronchus. Consolidation is increasing since prior study. Persistent moderate right pleural effusion is also mildly increased. Emphysematous changes in the lungs. Left lung is clear. Small Bochdalek's hernia in the left posterior hemidiaphragm. Upper Abdomen: No acute process is identified. Small esophageal hiatal hernia. Musculoskeletal: No new acute bony abnormalities. Degenerative changes in the spine. Vague sclerotic focus in the midthoracic vertebra without change. IMPRESSION: 1. Right posterior lower lobe consolidation and  mass with increasing consolidation compared with the prior study. 2.  Right pleural effusion is also mildly increased. 3. Diffuse emphysematous changes in the lungs. 4. Nonspecific mediastinal lymphadenopathy unchanged. 5. Small pericardial effusion. 6. Small esophageal hiatal hernia. 7. Small Bochdalek's left diaphragmatic hernia. 8. Aortic atherosclerosis. Electronically Signed   By: Burman Nieves M.D.   On: 01/22/2023 21:36   DG CHEST PORT 1 VIEW  Result Date: 01/22/2023 CLINICAL DATA:  Shortness of breath. EXAM: PORTABLE CHEST 1 VIEW COMPARISON:  01/21/2023 FINDINGS: Slightly increased densities at the right lung base compatible with pleural fluid and consolidation/airspace disease. Heart size is upper limits of normal but stable. Trachea is midline. Negative for Ashford Clouse pneumothorax. IMPRESSION: Slightly increased densities at the right lung base compatible with pleural fluid and airspace disease. Electronically Signed   By: Richarda Overlie M.D.   On: 01/22/2023 09:11        Scheduled Meds:  allopurinol  100 mg Oral Daily   arformoterol  15 mcg Nebulization BID   And   umeclidinium bromide  1 puff Inhalation Daily   ascorbic acid  500 mg Oral Daily   cholecalciferol  1,000 Units Oral Daily   cyanocobalamin  1,000 mcg Oral Daily   enoxaparin (LOVENOX) injection  40 mg Subcutaneous Q24H   guaiFENesin  600 mg Oral BID   insulin aspart  0-9 Units Subcutaneous TID WC   lidocaine  1 patch Transdermal Q24H   metoprolol tartrate  12.5 mg Oral BID   pantoprazole  40 mg Oral BID   polyethylene glycol  17 g Oral BID   Continuous Infusions:  ceFEPime (MAXIPIME) IV 2 g (01/23/23 1143)   vancomycin 1,000 mg (01/22/23 1758)     LOS: 3 days    Time spent: over 30 min    Lacretia Nicks, MD [facility]   To contact the attending provider between 7A-7P or the covering provider during after hours 7P-7A, please log into the web site [email or web address] and access using universal [place] password for that web site. If you do not have the password, please call the  hospital operator.  01/23/2023, 1:12 PM

## 2023-01-23 NOTE — Progress Notes (Signed)
01/23/23 1400  SLP Visit Information  SLP Received On 01/23/23  Subjective  Subjective pt awake in bed, appears disoriented  Patient/Family Stated Goal pt wants to "get out of here"  General Information  Date of Onset 01/23/23  HPI Richard Navarro is a 77 yo male adm to [facility] with fatigue/cough.  Pt has h/o NIDDM2, respiratory failure, right LL lung mass with liver mets - 11/2016, was on chemo which has been put on hold since 10/2022 due to fatigue.  Pt has h/o dysphagia s/p endoscopy with dilation 07/2021, prior DG esophagus showed disruption of primary peristaltic wave - but secondary and tertiary effective.  He is s/p thoracentesis 6/2 and chest tube was placed today 01/23/2023.  Pt familiar to this SLP from prior visit - where pt scored high on Reflux Symptom Index *28/45 score - when above 10 is highly indicative of laryngopharyngeal reflux.  Pt appeared frustrated during session reporting that he has had "barium swallows" and many tests.  He admits to dysphagia to some solids.  SLP inquired if his current dysphagia is consistent with symptoms prior to dilation - to which he confirmed.  He denies issues swallowing pills or liquids.  Type of Study Bedside Swallow Evaluation  Previous Swallow Assessment see HPI  Diet Prior to this Study Regular;Thin liquids (Level 0)  Temperature Spikes Noted N/A  Respiratory Status Nasal cannula  History of Recent Intubation No  Behavior/Cognition Alert;Confused;Requires cueing  Oral Cavity Assessment Dry  Oral Care Completed by SLP No  Oral Cavity - Dentition Adequate natural dentition  Vision Functional for self-feeding  Self-Feeding Abilities  (able to hold his cup independently; declined any solids)  Patient Positioning Upright in bed  Baseline Vocal Quality Normal  Volitional Cough  (chest tube place today, did not elicit)  Volitional Swallow  (did not conduct)  Pain Assessment  Pain Assessment Faces  Faces Pain Scale 6  Pain Location rolling on  side, uncertain to location of paine  Pain Descriptors / Indicators Discomfort  Pain Intervention(s) Limited activity within patient's tolerance  Oral Assessment (Complete on admission/transfer/every shift)  Lips Symmetrical  Teeth Poor dental hygiene  Tongue Pink;Moist  Mucous Membrane(s) Moist  Saliva Insufficient  Level of Consciousness Alert  Is patient on any of following O2 devices? None of the above  Nutritional status No high risk factors  Oral Assessment Risk  Low Risk  Oral Motor/Sensory Function  Overall Oral Motor/Sensory Function WFL  Ice Chips  Ice chips NT  Thin Liquid  Thin Liquid WFL  Presentation Straw;Self Fed  Nectar Thick Liquid  Nectar Thick Liquid NT  Honey Thick Liquid  Honey Thick Liquid NT  Puree  Puree NT (pt declined)  Solid  Solid NT (pt declined)  SLP - End of Session  Patient left in bed;with call bell/phone within reach;with bed alarm set  Nurse Communication Diet recommendation;Swallow strategies reviewed  Suspected Esophageal Findings  Suspected Esophageal Findings  (pt denies symptoms of reflux)  SLP Assessment  Clinical Impression Statement (ACUTE ONLY) LImited evaluation due to decreased participation, pt appears confused stating "I need to get out of here".  Having difficulty expressing himself - word finding - and RN reports confusion in the afternoon.  No focal CN deficits present and pt observed to consume pill with approximately ounces of juice sequentially.  He then consumed approx only 2 ounces of water in attempts to conduct [place].  PT does appear with some dyspnea during session - with pursed lip breathing. Advised he allow  rest breaks if he becomes dyspneic with po.  Pt declined to consume solids, applesauce nor further liquid intake.  With miminal po observed, pt with functional oropharyngeal swallow.  DG esophagram pending - doubtful pt would participate today due to his confusion/mentation.  Will follow up after DG esophagram to  determine if may be of any benefit to pt.  Pt denies following reflux precautions at home nor taking reflux medications.  Educated pt to MD ordering esophagram and concern for swallowing - but pt currently is confused and thus does not appear to understand concerns - though her states current dysphagia symptoms are consistent with those prior to his dilatation.  SLP Visit Diagnosis Dysphagia, unspecified (R13.10)  Impact on safety and function Mild aspiration risk  Other Related Risk Factors History of esophageal-related issues;History of GERD;Deconditioning;Decreased respiratory status  Swallow Evaluation Recommendations  SLP Diet Recommendations Regular;Thin liquid  Liquid Administration via Straw;Cup  Medication Administration Whole meds with liquid  Supervision Patient able to self feed;Intermittent supervision to cue for compensatory strategies  Compensations Slow rate;Small sips/bites  Postural Changes Seated upright at 90 degrees;Remain upright for at least 30 minutes after po intake  Treatment Plan  Oral Care Recommendations Oral care BID  Treatment Recommendations Other (Comment);Therapy as outlined in treatment plan below  Follow Up Recommendations Other (comment) (TBD)  Functional Status Assessment Patient has had a recent decline in their functional status and demonstrates the ability to make significant improvements in function in a reasonable and predictable amount of time.  Speech Therapy Frequency (ACUTE ONLY) min 1 x/week  Treatment Duration 1 week  Interventions Compensatory techniques;Patient/family education  Prognosis  Prognosis for improved oropharyngeal function Good  Barriers to Reach Goals Other (Comment) (mentation changes)  Barriers/Prognosis Comment medical diagnosis and understanding of plan, ? disorientation, confusion  Individuals Consulted  Consulted and Agree with Results and Recommendations Patient  Progression Toward Goals  Progression toward goals  Progressing toward goals  SLP Time Calculation  SLP Start Time (ACUTE ONLY) 1421  SLP Stop Time (ACUTE ONLY) 1445  SLP Time Calculation (min) (ACUTE ONLY) 24 min  SLP Evaluations  $ SLP Speech Visit 1 Visit  SLP Evaluations  $BSS Swallow 1 Procedure  Richard Infante, MS [facility] SLP Acute Rehab Services Office [phone]

## 2023-01-23 NOTE — Progress Notes (Signed)
Pharmacy Antibiotic Note  Richard Navarro is a 77 y.o. male admitted on 01/20/2023 with pneumonia,  Loculated right exudative pleural effusion.  Pharmacy has been consulted for Vancomycin, Cefepime dosing.  Today is day #4 broad spectrum abx - Afebrile, WBC 13.6 - awaiting pleural fluid culture and cytology   Plan: Continue Cefepime 2g IV q12h Continue Vancomycin 1g IV q24h (est AUC 479 using SCr 1.62, TBW<IBW, Vd 0.72) Measure Vanc levels as needed if continued long-term.  Follow up renal function, culture results, and clinical course.   Height: 6\' 3"  (190.5 cm) Weight: 76.2 kg (167 lb 15.9 oz) IBW/kg (Calculated) : 84.5  Temp (24hrs), Avg:98.7 F (37.1 C), Min:98.3 F (36.8 C), Max:98.9 F (37.2 C)  Recent Labs  Lab 01/20/23 1543 01/20/23 1717 01/20/23 1916 01/21/23 0517 01/22/23 0537 01/23/23 0524 01/23/23 0852  WBC 10.8*  --   --  14.9* 12.1*  --  13.6*  CREATININE 1.46*  --  1.46* 1.66* 1.58* 1.59* 1.62*  LATICACIDVEN  --  2.0* 1.1  --   --   --   --     Estimated Creatinine Clearance: 40.5 mL/min (A) (by C-G formula based on SCr of 1.62 mg/dL (H)).    Allergies  Allergen Reactions   Lisinopril Swelling    Angioedema- on this and afinitor same time   Simvastatin Other (See Comments)    Joint ache    Antimicrobials this admission: 6/1 Zosyn x 1 6/1 vancomycin >> 6/2 cefepime >>  Dose adjustments this admission:   Microbiology results: 6/1 MRSA PCR: not detected 6/1 BCx: 2/2 bottles ngtd 6/1 BCx: 1/2 bottles staph capitis. 6/2 Pleural fluid: ngtd 6/2 Legionella Ur Ag: ip  6/3 Strep pneumo Ur Ag: negative    Thank you for allowing pharmacy to be a part of this patient's care.  Lynann Beaver PharmD, BCPS [facility] main pharmacy [phone] 01/23/2023 11:06 AM

## 2023-01-23 NOTE — TOC Progression Note (Signed)
Transition of Care [facility]) - Progression Note    Patient Details  Name: CAMRY FALER MRN: [id number] Date of Birth: 12/10/1945  Transition of Care [facility]) CM/SW Contact  Huston Foley Jacklynn Ganong, RN Phone Number: 01/23/2023, 10:10 AM  Clinical Narrative:    Patient has been setup with Home Health PT/OT , Rolling walker will be delivered to patient's room by [employer] representative. Patient appreciative of TOC assistance.    Expected Discharge Plan: Home/Self Care Barriers to Discharge: Continued Medical Work up  Expected Discharge Plan and Services In-house Referral: NA Discharge Planning Services: CM Consult Post Acute Care Choice: Durable Medical Equipment, Home Health Living arrangements for the past 2 months: Apartment                 DME Arranged: Dan Humphreys rolling DME Agency: [employer] Date DME Agency Contacted: 01/23/23 Time DME Agency Contacted: [phone] Representative spoke with at DME Agency: Shaune Leeks HH Arranged: PT, OT [facility] Agency: [facility] Date [facility] Agency Contacted: 01/23/23 Time HH Agency Contacted: 0930 Representative spoke with at [facility] Agency: Lorenza Chick   Social Determinants of Health (SDOH) Interventions SDOH Screenings   Food Insecurity: No Food Insecurity (11/23/2022)  Housing: Patient Declined (11/23/2022)  Transportation Needs: No Transportation Needs (11/23/2022)  Utilities: Not At Risk (11/23/2022)  Depression (PHQ2-9): High Risk (11/27/2022)  Financial Resource Strain: Low Risk  (11/23/2022)  Physical Activity: Inactive (11/23/2022)  Social Connections: Socially Isolated (11/23/2022)  Stress: No Stress Concern Present (11/23/2022)  Tobacco Use: Medium Risk (01/20/2023)    Readmission Risk Interventions    01/22/2023   11:45 AM  Readmission Risk Prevention Plan  Transportation Screening Complete  PCP or Specialist Appt within 5-7 Days Complete  Home Care Screening Complete  Medication Review (RN CM) Complete

## 2023-01-23 NOTE — Procedures (Addendum)
Insertion of Chest Tube Procedure Note  NEEV MCBURNIE  [id number]  June 08, 1946  Date:01/23/23  Time:1:39 PM    Provider Performing: Omar Person   Procedure: Pleural Catheter Insertion w/ Imaging Guidance (40981)  Indication(s) Effusion  Consent Risks of the procedure as well as the alternatives and risks of each were explained to the patient and/or caregiver.  Consent for the procedure was obtained and is signed in the bedside chart  Anesthesia Topical only with 1% lidocaine    Time Out Verified patient identification, verified procedure, site/side was marked, verified correct patient position, special equipment/implants available, medications/allergies/relevant history reviewed, required imaging and test results available.   Sterile Technique Maximal sterile technique including full sterile barrier drape, hand hygiene, sterile gown, sterile gloves, mask, hair covering, sterile ultrasound probe cover (if used).   Procedure Description Ultrasound used to identify appropriate pleural anatomy for placement and overlying skin marked. Area of placement cleaned and draped in sterile fashion.  A 14 French pigtail pleural catheter was placed into the right pleural space using Seldinger technique. Appropriate return of fluid was obtained.  The tube was connected to atrium and placed on -20 cm H2O wall suction.     Complications/Tolerance None; patient tolerated the procedure well. Chest X-ray is ordered to verify placement.   EBL Minimal  Specimen(s) fluid sent for culture and cytology

## 2023-01-23 NOTE — Progress Notes (Signed)
Mobility Specialist - Progress Note   01/23/23 1039  Oxygen Therapy  O2 Device Nasal Cannula  O2 Flow Rate (L/min) 4 L/min  Mobility  Activity Ambulated independently in room;Ambulated independently to bathroom  Level of Assistance Standby assist, set-up cues, supervision of patient - no hands on  Assistive Device None  Distance Ambulated (ft) 40 ft  Range of Motion/Exercises Active  Activity Response Tolerated fair  Mobility Referral Yes  $Mobility charge 1 Mobility  Mobility Specialist Start Time (ACUTE ONLY) 1020  Mobility Specialist Stop Time (ACUTE ONLY) 1040  Mobility Specialist Time Calculation (min) (ACUTE ONLY) 20 min   Pt received in chair and agreed to mobility. Had chest tightness upon first seated rest break on bench. Pt stood up to go to rest room and had chest tightness again.  Returned to bed with all needs met.  Marilynne Halsted Mobility Specialist
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
## 2023-01-24 ENCOUNTER — Inpatient Hospital Stay (HOSPITAL_COMMUNITY): Payer: Medicare Other

## 2023-01-24 DIAGNOSIS — C7A8 Other malignant neuroendocrine tumors: Secondary | ICD-10-CM | POA: Diagnosis not present

## 2023-01-24 DIAGNOSIS — Z7189 Other specified counseling: Secondary | ICD-10-CM

## 2023-01-24 DIAGNOSIS — J90 Pleural effusion, not elsewhere classified: Secondary | ICD-10-CM | POA: Diagnosis not present

## 2023-01-24 DIAGNOSIS — J9621 Acute and chronic respiratory failure with hypoxia: Secondary | ICD-10-CM | POA: Diagnosis not present

## 2023-01-24 DIAGNOSIS — J189 Pneumonia, unspecified organism: Secondary | ICD-10-CM | POA: Diagnosis not present

## 2023-01-24 DIAGNOSIS — Z515 Encounter for palliative care: Secondary | ICD-10-CM

## 2023-01-24 LAB — CBC WITH DIFFERENTIAL/PLATELET
Abs Immature Granulocytes: 0.1 10*3/uL — ABNORMAL HIGH (ref 0.00–0.07)
Basophils Absolute: 0.1 10*3/uL (ref 0.0–0.1)
Basophils Relative: 1 %
Eosinophils Absolute: 0.2 10*3/uL (ref 0.0–0.5)
Eosinophils Relative: 2 %
HCT: 35.7 % — ABNORMAL LOW (ref 39.0–52.0)
Hemoglobin: 11.5 g/dL — ABNORMAL LOW (ref 13.0–17.0)
Immature Granulocytes: 1 %
Lymphocytes Relative: 10 %
Lymphs Abs: 1.1 10*3/uL (ref 0.7–4.0)
MCH: 30.1 pg (ref 26.0–34.0)
MCHC: 32.2 g/dL (ref 30.0–36.0)
MCV: 93.5 fL (ref 80.0–100.0)
Monocytes Absolute: 0.9 10*3/uL (ref 0.1–1.0)
Monocytes Relative: 8 %
Neutro Abs: 8.7 10*3/uL — ABNORMAL HIGH (ref 1.7–7.7)
Neutrophils Relative %: 78 %
Platelets: 262 10*3/uL (ref 150–400)
RBC: 3.82 MIL/uL — ABNORMAL LOW (ref 4.22–5.81)
RDW: 15.6 % — ABNORMAL HIGH (ref 11.5–15.5)
WBC: 11 10*3/uL — ABNORMAL HIGH (ref 4.0–10.5)
nRBC: 0 % (ref 0.0–0.2)

## 2023-01-24 LAB — CYTOLOGY - NON PAP

## 2023-01-24 LAB — COMPREHENSIVE METABOLIC PANEL
ALT: 50 U/L — ABNORMAL HIGH (ref 0–44)
AST: 49 U/L — ABNORMAL HIGH (ref 15–41)
Albumin: 2.9 g/dL — ABNORMAL LOW (ref 3.5–5.0)
Alkaline Phosphatase: 72 U/L (ref 38–126)
Anion gap: 12 (ref 5–15)
BUN: 19 mg/dL (ref 8–23)
CO2: 19 mmol/L — ABNORMAL LOW (ref 22–32)
Calcium: 9.1 mg/dL (ref 8.9–10.3)
Chloride: 106 mmol/L (ref 98–111)
Creatinine, Ser: 1.62 mg/dL — ABNORMAL HIGH (ref 0.61–1.24)
GFR, Estimated: 43 mL/min — ABNORMAL LOW (ref >60)
Glucose, Bld: 159 mg/dL — ABNORMAL HIGH (ref 70–99)
Potassium: 3.5 mmol/L (ref 3.5–5.1)
Sodium: 137 mmol/L (ref 135–145)
Total Bilirubin: 0.8 mg/dL (ref 0.3–1.2)
Total Protein: 8.5 g/dL — ABNORMAL HIGH (ref 6.5–8.1)

## 2023-01-24 LAB — BODY FLUID CULTURE W GRAM STAIN: Culture: NO GROWTH

## 2023-01-24 LAB — HEMOGLOBIN AND HEMATOCRIT, BLOOD
HCT: 35.3 % — ABNORMAL LOW (ref 39.0–52.0)
Hemoglobin: 11.3 g/dL — ABNORMAL LOW (ref 13.0–17.0)

## 2023-01-24 LAB — GLUCOSE, CAPILLARY
Glucose-Capillary: 147 mg/dL — ABNORMAL HIGH (ref 70–99)
Glucose-Capillary: 193 mg/dL — ABNORMAL HIGH (ref 70–99)
Glucose-Capillary: 237 mg/dL — ABNORMAL HIGH (ref 70–99)
Glucose-Capillary: 199 mg/dL — ABNORMAL HIGH (ref 70–99)

## 2023-01-24 LAB — MRSA NEXT GEN BY PCR, NASAL: MRSA by PCR Next Gen: NOT DETECTED

## 2023-01-24 LAB — CULTURE, BLOOD (ROUTINE X 2)

## 2023-01-24 MED ORDER — BISACODYL 5 MG PO TBEC: 5.0000 mg | DELAYED_RELEASE_TABLET | Freq: Every day | ORAL | Status: DC | PRN

## 2023-01-24 MED ORDER — STERILE WATER FOR INJECTION IJ SOLN
5.0000 mg | Freq: Once | RESPIRATORY_TRACT | Status: AC
  Administered 2023-01-24: 5 mg via INTRAPLEURAL
  Filled 2023-01-24: qty 5

## 2023-01-24 MED ORDER — SODIUM CHLORIDE (PF) 0.9 % IJ SOLN
10.0000 mg | Freq: Once | INTRAMUSCULAR | Status: AC
  Administered 2023-01-24: 10 mg via INTRAPLEURAL
  Filled 2023-01-24: qty 10

## 2023-01-24 MED ORDER — OLANZAPINE 10 MG IM SOLR
2.5000 mg | Freq: Once | INTRAMUSCULAR | Status: DC | PRN
  Filled 2023-01-24: qty 10

## 2023-01-24 MED ORDER — METOPROLOL TARTRATE 25 MG PO TABS
25.0000 mg | ORAL_TABLET | Freq: Two times a day (BID) | ORAL | Status: DC
  Administered 2023-01-25 – 2023-01-27 (×5): 25 mg via ORAL
  Filled 2023-01-24 (×5): qty 1

## 2023-01-24 MED ORDER — DOCUSATE SODIUM 100 MG PO CAPS
100.0000 mg | ORAL_CAPSULE | Freq: Two times a day (BID) | ORAL | Status: DC
  Administered 2023-01-25 – 2023-01-27 (×5): 100 mg via ORAL
  Filled 2023-01-24 (×5): qty 1

## 2023-01-24 MED ORDER — SODIUM CHLORIDE 0.9% FLUSH
10.0000 mL | Freq: Three times a day (TID) | INTRAVENOUS | Status: DC
  Administered 2023-01-24: 10 mL via INTRAPLEURAL

## 2023-01-24 MED ORDER — ALUM & MAG HYDROXIDE-SIMETH 200-200-20 MG/5ML PO SUSP: 30.0000 mL | Freq: Four times a day (QID) | ORAL | Status: DC | PRN

## 2023-01-24 MED ORDER — CHLORHEXIDINE GLUCONATE CLOTH 2 % EX PADS
6.0000 | MEDICATED_PAD | Freq: Every day | CUTANEOUS | Status: DC
  Administered 2023-01-24 – 2023-01-26 (×2): 6 via TOPICAL

## 2023-01-24 MED ORDER — AMLODIPINE BESYLATE 10 MG PO TABS
10.0000 mg | ORAL_TABLET | Freq: Every evening | ORAL | Status: DC
  Administered 2023-01-24 – 2023-01-26 (×3): 10 mg via ORAL
  Filled 2023-01-24 (×3): qty 1

## 2023-01-24 MED ORDER — DEXMEDETOMIDINE HCL IN NACL 200 MCG/50ML IV SOLN
0.0000 ug/kg/h | INTRAVENOUS | Status: DC
  Administered 2023-01-24 (×2): 0.4 ug/kg/h via INTRAVENOUS
  Administered 2023-01-25: 0.2 ug/kg/h via INTRAVENOUS
  Filled 2023-01-24 (×3): qty 50
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
## 2023-01-24 NOTE — Progress Notes (Signed)
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  He underwent thoracentesis with removal of 550cc, exudative, negative culture, cytology pending - thought to be loculated parapneumonic effusion, pigtail chest tube placed.  He is being treated with Unasyn.  He was last seen by Dr. Arbutus Ped on 01/10/23 in clinic.  He is s/p particle embolization of metastatic neuroendocrine CA of the liver in 2019 and was on Afinitor (Everolimus) chemotherapy until it was stopped in 10/2019 due to renal insufficiency as well as disease progression.  He was changed to Xeloda and Temodar, and this has been on hold since 10/2022.  Assessment and Plan:  Acute on Chronic Hypoxic Respiratory Failure due to Pleural Effusion -Patient presented with tachycardia, leukocytosis, tachypnea concerning for sepsis but without acute organ dysfunction to support the diagnosis -He was requiring increased O2 (4L vs. Home 2L) -CT chest with increasing R pleural effusion with developing lobular component in right upper chest and increasing infiltration in the right lung (concern for progression of neoplasm vs development of superimposed pneumonia) -> he's noted some infectious symptoms over the past week, concerning for infectious cause -repeat CT chest with R posterior lobe consolidation and mass with increasing consolidation compared with prior study, R pleural effusion mildly increased, diffuse emphysematous changes in lungs, nonspecific  mediastinal LAD, small pericardial effusion, small esophageal hiatal hernia, small bochdalek's left diaphragmatic hernia -He was started on cefepime  -MRSA negative, urine strep negative, urine legionella, sputum culture pending collection -Pulmonology was consulted and he underwent thoracentesis and pigtail placement -Pleural effusion is exudate by lights criteria, gram stain with no organisms.   -Culture NGT -Cytology pending -Plan for fibrinolysis of effusion today by pulm   Acute Metabolic Encephalopathy -Suspected to be related to acute illness, pain meds -His wife has noted some MCI concerns and so this may be more chronic for him -Delirium precautions ordered   Staph Capitis in blood cultures This is likely a contaminant   COPD -Chronic hypoxic respiratory failure on 2liters at home -No wheezing on exam, no acute hypoxia  -Continue albuterol, Stiolto -[place] O2 supplementation as needed   DM -Recent A1c 6.1 -Hold glipizide -Will cover with sensitive-scale SSI  HTN -Takes amlodipine and metoprolol, held on admission -Resume both BP meds now due to tachycardia, hypertension   Stage 3a CKD -Appears to be stable at this time -Attempt to avoid nephrotoxic medications -Recheck BMP in AM    Stage IV low-grade neuroendocrine carcinoma -He has been treated with Xeloda and temodar every 4 weeks but his treatment has been on hold since March 2024 due to persistent fatigue and cough productive of thick sputum -Dr. Arbutus Ped is aware of his admission, discussed with admitter on 6/4 -Will request consult via Inbox message  DNR -I have discussed code status with the patient and he would not desire resuscitation and would prefer to die a natural death should that situation arise. -RN was present during this conversation, code status changed. -I also spoke  with his ex-wife, who agrees     Subjective: He reports feeling terrible all over, no specific complaints.  Ongoing periodic  cough with SOB when he is coughing.  Condom cath in place, able to void without difficulty.  His ex-wife has been noticing some MCI.  She does think palliative care may be helpful.  He has been more isolated since COVID, causing some anxiety.    Physical Exam: Vitals:   01/24/23 1515 01/24/23 1600 01/24/23 1630 01/24/23 1641  BP: (!) 146/84 (!) 184/90  (!) 158/70  Pulse: (!) 137 (!) 145 (!) 136 (!) 131  Resp: (!) 32 (!) 26 (!) 32 (!) 24  Temp: 98.6 F (37 C) 98.6 F (37 C)    TempSrc: Oral Oral    SpO2: 93% 100% 96% 99%  Weight:  73.3 kg    Height:  6\' 3"  (1.905 m)     General:  Appears calm and comfortable and is in NAD, cachectic Eyes:   EOMI, normal lids, iris ENT:  grossly normal hearing, lips & tongue, mmm Neck:  no LAD, masses or thyromegaly Cardiovascular:  RRR, no m/r/g. No LE edema.  Respiratory:   CTA bilaterally with no wheezes/rales/rhonchi, diminished in R lung base.  Normal respiratory effort. Abdomen:  soft, NT, ND Skin:  no rash or induration seen on limited exam Musculoskeletal:  grossly normal tone BUE/BLE, good ROM, no bony abnormality Psychiatric:  grossly normal mood and affect, speech fluent and appropriate, AOx3 Neurologic:  CN 2-12 grossly intact, moves all extremities in coordinated fashion   Radiological Exams on Admission: Independently reviewed - see discussion in A/P where applicable  DG CHEST PORT 1 VIEW  Result Date: 01/24/2023 CLINICAL DATA:  Hypoxia EXAM: PORTABLE CHEST 1 VIEW COMPARISON:  01/24/2023, 6:52 a.m. FINDINGS: Cardiomegaly. Unchanged pigtail chest tube about the right lung base with associated atelectasis or consolidation. No pneumothorax. Mild diffuse interstitial opacity, unchanged. Osseous structures unremarkable. IMPRESSION: 1. Unchanged pigtail chest tube about the right lung base with associated atelectasis or consolidation. No pneumothorax. 2. Cardiomegaly and mild diffuse interstitial opacity, unchanged, likely edema.  Electronically Signed   By: Jearld Lesch M.D.   On: 01/24/2023 14:43   DG ESOPHAGUS W SINGLE CM (SOL OR THIN BA)  Result Date: 01/24/2023 CLINICAL DATA:  Pneumonia. Chest tube in place. History of neuroendocrine tumor. EXAM: ESOPHOGRAM / BARIUM SWALLOW / BARIUM TABLET STUDY TECHNIQUE: single contrast examination performed using thin barium liquid. The patient was observed with fluoroscopy swallowing a 13 mm barium sulphate tablet. FLUOROSCOPY: Radiation Exposure Index (as provided by the fluoroscopic device): 15.7 mGy Kerma COMPARISON:  Chest radiograph 08/25/2022, CT chest 01/20/2023 FINDINGS: Exam limited due to patient immobility.  Chest tube in place. No swallowing dysfunction in the high cervical esophagus. No mucosal irregularity, stricture or mass within the thoracic esophagus or distal esophagus. A small hiatal hernia present. 13 mm barium tablet remained within the small hiatal hernia at the termination of exam. IMPRESSION: 1. No esophageal stricture or mass.  Patent GE junction. 2. Small hiatal hernia. 3. Barium tablet trapped within the small hiatal hernia. Electronically Signed   By: Genevive Bi M.D.   On: 01/24/2023 11:49   DG CHEST PORT 1 VIEW  Result Date: 01/24/2023 CLINICAL DATA:  Follow-up pleural effusion EXAM: PORTABLE CHEST 1 VIEW COMPARISON:  Chest radiograph 1 day prior FINDINGS: The cardiomediastinal silhouette is stable. A right basilar chest tube remains in place. Linear opacities in the right base and a small right pleural effusion are unchanged.  Linear opacities in the left lung base are also similar to the prior study, likely reflecting subsegmental atelectasis. There is no new or worsening focal airspace disease. There is no significant left effusion. There is no pneumothorax There is no acute osseous abnormality. IMPRESSION: Right basilar chest tube remains in place with unchanged small residual right pleural effusion and adjacent patchy airspace opacity. Electronically  Signed   By: Lesia Hausen M.D.   On: 01/24/2023 10:52   DG CHEST PORT 1 VIEW  Result Date: 01/23/2023 CLINICAL DATA:  [id number] Pleural effusion [id number] EXAM: PORTABLE CHEST - 1 VIEW COMPARISON:  the previous day's study FINDINGS: Pigtail catheter has been placed at the right lung base, with decrease in pleural effusion, small residual. No pneumothorax. Residual patchy infiltrate or atelectasis at the right lung base. Improved aeration at the left lung base. Heart size and mediastinal contours are within normal limits. Visualized bones unremarkable. IMPRESSION: Interval decrease in right pleural effusion following pigtail catheter placement. Electronically Signed   By: Corlis Leak M.D.   On: 01/23/2023 14:18   CT CHEST WO CONTRAST  Result Date: 01/22/2023 CLINICAL DATA:  Pneumonia with complication suspected.  X-ray done. EXAM: CT CHEST WITHOUT CONTRAST TECHNIQUE: Multidetector CT imaging of the chest was performed following the standard protocol without IV contrast. RADIATION DOSE REDUCTION: This exam was performed according to the departmental dose-optimization program which includes automated exposure control, adjustment of the mA and/or kV according to patient size and/or use of iterative reconstruction technique. COMPARISON:  Chest radiograph 01/22/2023.  CT chest 01/20/2023 FINDINGS: Cardiovascular: Normal heart size. Small pericardial effusion. Normal caliber thoracic aorta. Calcification of the aorta and coronary arteries. Mediastinum/Nodes: Thyroid gland is unremarkable. Enlarged mediastinal lymph nodes are present with pretracheal nodes measuring up to 11 mm short axis dimension. Lymphadenopathy is similar to prior study. Changes are nonspecific but could be reactive or metastatic. Esophagus is decompressed. Lungs/Pleura: Persistent finding of mass and consolidation in the right lower lobe causing invasion or eccentric compression of the right lower lobe bronchus. Consolidation is increasing since prior  study. Persistent moderate right pleural effusion is also mildly increased. Emphysematous changes in the lungs. Left lung is clear. Small Bochdalek's hernia in the left posterior hemidiaphragm. Upper Abdomen: No acute process is identified. Small esophageal hiatal hernia. Musculoskeletal: No new acute bony abnormalities. Degenerative changes in the spine. Vague sclerotic focus in the midthoracic vertebra without change. IMPRESSION: 1. Right posterior lower lobe consolidation and mass with increasing consolidation compared with the prior study. 2. Right pleural effusion is also mildly increased. 3. Diffuse emphysematous changes in the lungs. 4. Nonspecific mediastinal lymphadenopathy unchanged. 5. Small pericardial effusion. 6. Small esophageal hiatal hernia. 7. Small Bochdalek's left diaphragmatic hernia. 8. Aortic atherosclerosis. Electronically Signed   By: Burman Nieves M.D.   On: 01/22/2023 21:36    EKG: last on 6/1   Labs on Admission: I have personally reviewed the available labs and imaging studies at the time of the admission.  Pertinent labs:    CMP pending WBC 11 Hgb 11.5  Family Communication: None present; I spoke with his ex-wife at the time of the consult  Disposition: Status is: Inpatient Remains inpatient appropriate because: still with hypoxia, acutely ill  Planned Discharge Destination:  TBD    Time spent: 50 minutes  Author: [name], MD 01/24/2023 4:47 PM  For on call review [email or web address].

## 2023-01-24 NOTE — Consult Note (Signed)
Palliative Care Consult Note                                  Date: 01/24/2023   Patient Name: Richard Navarro  DOB: 04/30/1946  MRN: [id number]  Age / Sex: 77 y.o., male  PCP: Richard Majestic, MD Referring Physician: [name], MD  Reason for Consultation: Establishing goals of care  HPI/Patient Profile: 77 y.o. male  with past medical history of high-grade neuroendocrine carcinoma with right lower lobe mass and mets to the liver.  This was diagnosed in 2018.  He has been on chemotherapy up until March of this year; it has been on hold due to persistent fatigue and productive cough.  He presented to the ED on 01/20/2023 with complaint of cough and shortness of breath.  Imaging showed right lower lobe infiltrate and right-sided pleural effusion.  On 6/2, he underwent thoracentesis yielding 550 mL of hazy yellow fluid consistent with exudate.  Past Medical History:  Diagnosis Date   Arthritis    Blood transfusion without reported diagnosis yrs ago   Chronic kidney disease    Depression    Diabetes mellitus without complication (HCC)    Emphysema of lung (HCC)    GERD (gastroesophageal reflux disease)    Gout    Headache    Hyperlipidemia    Hypertension    Primary cancer of right lung (HCC) 12/22/2016   PTSD (post-traumatic stress disorder)     Subjective:   I have reviewed medical records including progress notes, labs and imaging. He has just transferred to ICU due to worsening shortness of breath and hypoxia.  RN reports he is agitated and refusing medications.   On my assessment, patient appears restless. He is unable to tell me why he is in the hospital or anything about his medical history. He states he needs to "get out of here".   I met with his family in the 2nd floor waiting area to discuss diagnosis, prognosis, and GOC. Present are his significant other/Richard Navarro and his daughter-in-law Richard Navarro.   I introduced  Palliative Medicine as specialized medical care for people living with serious illness. It focuses on providing relief from the symptoms and stress of a serious illness.   Richard Navarro explains that she has been with the patient (off and on) for 50 years. They were married at one time, but divorced in 1989. Patient has 2 children (from his first marriage) and multiple grandchildren. When patient was diagnosed with cancer in 2018, he came to live with Richard Navarro. She has been his primary caregiver since then; taking him to medical appointments and helping him manage his medications.   We discussed patient's current illness and what it means in the larger context of his ongoing co-morbidities. Current clinical status was reviewed. Discussed his acute medical issues of pneumonia, pleural effusion (persistent despite drainage), acute respiratory failure with hypoxia, and agitation/delirium.   We also reviewed patient's oncology history. Discussed that chemotherapy had been on hold since March of this year. Discussed the natural disease trajectory of metastatic cancer - that it is a non-curable condition and the intent of treatment is palliative in nature.  Created space and opportunity for patient and family to explore thoughts and feelings regarding current medical situation. Values and goals of care were attempted to be elicited.  Family expresses that patient's decline feels very "sudden", as he has been fairly functional. Prior to admission, he  was ambulatory, independent with ADLs, and continued to drive.   Discussed different paths of care - full scope versus limited interventions (treat the treatable) versus comfort care. Family confirms DNR status and states that patient would not want to be on a ventilator under any circumstances. For now, they wish to continue current limited interventions and are hopeful for improvement. They will make additional decisions as they arise pending patient's clinical course.    Questions and concerns addressed. Family encouraged to call with questions or concerns.     Review of Systems  Respiratory:  Positive for shortness of breath.     Objective:   Primary Diagnoses: Present on Admission:  Lobar pneumonia (HCC)  Diabetes mellitus with renal manifestation (HCC)  COPD (chronic obstructive pulmonary disease) (HCC)  Neuroendocrine carcinoma (HCC)  HCAP (healthcare-associated pneumonia)   Physical Exam Vitals reviewed.  Constitutional:      General: He is not in acute distress.    Appearance: He is ill-appearing.  Cardiovascular:     Rate and Rhythm: Tachycardia present.  Pulmonary:     Effort: Tachypnea present.  Neurological:     Mental Status: He is alert. He is disoriented.  Psychiatric:        Behavior: Behavior is agitated.     Vital Signs:  BP (!) 146/84 (BP Location: Right Arm)   Pulse (!) 137   Temp 98.6 F (37 C) (Oral)   Resp (!) 32   Ht 6\' 3"  (1.905 m)   Wt 76.2 kg   SpO2 93%   BMI 21.00 kg/m   Palliative Assessment/Data: PPS 20-30%     Assessment & Plan:   SUMMARY OF RECOMMENDATIONS   DNR with limited interventions (treat the treatable) Family is hopeful for improvement and ill make additional treatment decisions as they arise pending patient's clinical course Ongoing palliative support  Primary Decision Maker: OTHER - Richard Navarro (significant other and primary caregiver)  Symptom Management:  Per PCCM  Prognosis:  Unable to determine  Discharge Planning:  To Be Determined    Discussed with: Richard Roach NP    Thank you for allowing [place] to participate in the care of Richard Navarro  MDM - High   Signed by: Richard Foot, NP Palliative Medicine Team  Team Phone # [phone]  For individual providers, please see AMION

## 2023-01-24 NOTE — Hospital Course (Addendum)
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  He underwent thoracentesis with removal of 550cc, exudative, negative culture, cytology pending - thought to be loculated parapneumonic effusion, pigtail chest tube placed.  He is being treated with Unasyn.  He was last seen by Dr. Arbutus Ped on 01/10/23 in clinic.  He is s/p particle embolization of metastatic neuroendocrine CA of the liver in 2019 and was on Afinitor (Everolimus) chemotherapy until it was stopped in 10/2019 due to renal insufficiency as well as disease progression.  He was changed to Xeloda and Temodar, and this has been on hold since 10/2022.

## 2023-01-24 NOTE — Progress Notes (Signed)
eLink Physician-Brief Progress Note Patient Name: Richard Navarro DOB: 1945/11/24 MRN: [id number]   Date of Service  01/24/2023  HPI/Events of Note  Patient with agitated delirium earlier but now resting quietly, his Precedex gtt was discontinued due to hypotension.  eICU Interventions  Will order Zyprexa 2.5 mg IM x 1 to be given PRN recurrence of agitation only ( as a Precedex sparing intervention).        Thomasene Lot Karsen Fellows 01/24/2023, 10:49 PM

## 2023-01-24 NOTE — Progress Notes (Signed)
DIAGNOSIS: Stage IV low-grade neuroendocrine carcinoma, carcinoid tumor presented with large right lower lobe lung mass in addition to right hilar lymphadenopathy and liver metastasis diagnosed in April 2018.   PRIOR THERAPY:  1) Status post particle embolization of segment 7 of the metastatic neuroendocrine carcinoma of the liver by interventional radiology on 01/09/2018 under the care of Dr. Archer Asa. 2) Afinitor (Everolimus) 10 mg by mouth daily. First dose started 01/22/2017. Status post 2 months of treatment. He is currently on treatment with Afinitor 7.5 mg by mouth daily.  Status post 24 months.  His treatment is currently on hold since September 2020 secondary to renal insufficiency.  This treatment was discontinued on March 10th 2021 secondary to renal insufficiency as well as disease progression.   CURRENT THERAPY: Systemic chemotherapy with Xeloda 750 mg/M2 twice daily for 14 days every 4 weeks in addition to Temodar 200 mg/M2 for 5 days (days 10-14) every 4 weeks.  He started the first dose of his treatment in the middle of March 2021.  Status post 35 months of treatment.  His treatment has been on hold for 3 months before resuming it again in December 2022.  He started cycle #34 on July 09, 2022.  His treatment is currently on hold since March 2024  Subjective: The patient is seen and examined today.  His wife [name] in addition to his daughter-in-law and granddaughter were at the bedside.  The patient was admitted to the hospital with fever and shortness of breath.  He also continues to have abdominal bloating and tenderness.  He passes a lot of gas.  He denied having any current fever but has chills.  He has no nausea, vomiting but has constipation.  Objective: Vital signs in last 24 hours: Temp:  [98.3 F (36.8 C)-98.6 F (37 C)] 98.6 F (37 C) (06/05 1600) Pulse Rate:  [88-145] 131 (06/05 1641) Resp:  [18-32] 24 (06/05 1641) BP: (134-184)/(70-109) 158/70 (06/05 1641) SpO2:   [92 %-100 %] 99 % (06/05 1641) FiO2 (%):  [32 %] 32 % (06/05 0925) Weight:  [161 lb 9.6 oz (73.3 kg)] 161 lb 9.6 oz (73.3 kg) (06/05 1600)  Intake/Output from previous day: 06/04 0701 - 06/05 0700 In: 494.4 [P.O.:296; IV Piggyback:198.4] Out: 1060 [Urine:1000; Chest Tube:60] Intake/Output this shift: Total I/O In: 354 [P.O.:354] Out: 300 [Chest Tube:300]  General appearance: alert, cooperative, fatigued, and mild distress Resp: diminished breath sounds RLL and dullness to percussion RLL Cardio: regular rate and rhythm, S1, S2 normal, no murmur, click, rub or gallop GI: soft, non-tender; bowel sounds normal; no masses,  no organomegaly Extremities: extremities normal, atraumatic, no cyanosis or edema  Lab Results:  Recent Labs    01/23/23 0852 01/24/23 0902 01/24/23 1436  WBC 13.6* 11.0*  --   HGB 11.0* 11.5* 11.3*  HCT 34.3* 35.7* 35.3*  PLT 247 262  --    BMET Recent Labs    01/23/23 0852 01/24/23 0902  NA 132* 137  K 3.8 3.5  CL 105 106  CO2 18* 19*  GLUCOSE 210* 159*  BUN 21 19  CREATININE 1.62* 1.62*  CALCIUM 8.7* 9.1    Studies/Results: DG CHEST PORT 1 VIEW  Result Date: 01/24/2023 CLINICAL DATA:  Hypoxia EXAM: PORTABLE CHEST 1 VIEW COMPARISON:  01/24/2023, 6:52 a.m. FINDINGS: Cardiomegaly. Unchanged pigtail chest tube about the right lung base with associated atelectasis or consolidation. No pneumothorax. Mild diffuse interstitial opacity, unchanged. Osseous structures unremarkable. IMPRESSION: 1. Unchanged pigtail chest tube about the right lung base with  associated atelectasis or consolidation. No pneumothorax. 2. Cardiomegaly and mild diffuse interstitial opacity, unchanged, likely edema. Electronically Signed   By: Jearld Lesch M.D.   On: 01/24/2023 14:43   DG ESOPHAGUS W SINGLE CM (SOL OR THIN BA)  Result Date: 01/24/2023 CLINICAL DATA:  Pneumonia. Chest tube in place. History of neuroendocrine tumor. EXAM: ESOPHOGRAM / BARIUM SWALLOW / BARIUM TABLET  STUDY TECHNIQUE: single contrast examination performed using thin barium liquid. The patient was observed with fluoroscopy swallowing a 13 mm barium sulphate tablet. FLUOROSCOPY: Radiation Exposure Index (as provided by the fluoroscopic device): 15.7 mGy Kerma COMPARISON:  Chest radiograph 08/25/2022, CT chest 01/20/2023 FINDINGS: Exam limited due to patient immobility.  Chest tube in place. No swallowing dysfunction in the high cervical esophagus. No mucosal irregularity, stricture or mass within the thoracic esophagus or distal esophagus. A small hiatal hernia present. 13 mm barium tablet remained within the small hiatal hernia at the termination of exam. IMPRESSION: 1. No esophageal stricture or mass.  Patent GE junction. 2. Small hiatal hernia. 3. Barium tablet trapped within the small hiatal hernia. Electronically Signed   By: Genevive Bi M.D.   On: 01/24/2023 11:49   DG CHEST PORT 1 VIEW  Result Date: 01/24/2023 CLINICAL DATA:  Follow-up pleural effusion EXAM: PORTABLE CHEST 1 VIEW COMPARISON:  Chest radiograph 1 day prior FINDINGS: The cardiomediastinal silhouette is stable. A right basilar chest tube remains in place. Linear opacities in the right base and a small right pleural effusion are unchanged. Linear opacities in the left lung base are also similar to the prior study, likely reflecting subsegmental atelectasis. There is no new or worsening focal airspace disease. There is no significant left effusion. There is no pneumothorax There is no acute osseous abnormality. IMPRESSION: Right basilar chest tube remains in place with unchanged small residual right pleural effusion and adjacent patchy airspace opacity. Electronically Signed   By: Lesia Hausen M.D.   On: 01/24/2023 10:52   DG CHEST PORT 1 VIEW  Result Date: 01/23/2023 CLINICAL DATA:  [id number] Pleural effusion [id number] EXAM: PORTABLE CHEST - 1 VIEW COMPARISON:  the previous day's study FINDINGS: Pigtail catheter has been placed at the right  lung base, with decrease in pleural effusion, small residual. No pneumothorax. Residual patchy infiltrate or atelectasis at the right lung base. Improved aeration at the left lung base. Heart size and mediastinal contours are within normal limits. Visualized bones unremarkable. IMPRESSION: Interval decrease in right pleural effusion following pigtail catheter placement. Electronically Signed   By: Corlis Leak M.D.   On: 01/23/2023 14:18   CT CHEST WO CONTRAST  Result Date: 01/22/2023 CLINICAL DATA:  Pneumonia with complication suspected.  X-ray done. EXAM: CT CHEST WITHOUT CONTRAST TECHNIQUE: Multidetector CT imaging of the chest was performed following the standard protocol without IV contrast. RADIATION DOSE REDUCTION: This exam was performed according to the departmental dose-optimization program which includes automated exposure control, adjustment of the mA and/or kV according to patient size and/or use of iterative reconstruction technique. COMPARISON:  Chest radiograph 01/22/2023.  CT chest 01/20/2023 FINDINGS: Cardiovascular: Normal heart size. Small pericardial effusion. Normal caliber thoracic aorta. Calcification of the aorta and coronary arteries. Mediastinum/Nodes: Thyroid gland is unremarkable. Enlarged mediastinal lymph nodes are present with pretracheal nodes measuring up to 11 mm short axis dimension. Lymphadenopathy is similar to prior study. Changes are nonspecific but could be reactive or metastatic. Esophagus is decompressed. Lungs/Pleura: Persistent finding of mass and consolidation in the right lower lobe causing invasion or eccentric compression  of the right lower lobe bronchus. Consolidation is increasing since prior study. Persistent moderate right pleural effusion is also mildly increased. Emphysematous changes in the lungs. Left lung is clear. Small Bochdalek's hernia in the left posterior hemidiaphragm. Upper Abdomen: No acute process is identified. Small esophageal hiatal hernia.  Musculoskeletal: No new acute bony abnormalities. Degenerative changes in the spine. Vague sclerotic focus in the midthoracic vertebra without change. IMPRESSION: 1. Right posterior lower lobe consolidation and mass with increasing consolidation compared with the prior study. 2. Right pleural effusion is also mildly increased. 3. Diffuse emphysematous changes in the lungs. 4. Nonspecific mediastinal lymphadenopathy unchanged. 5. Small pericardial effusion. 6. Small esophageal hiatal hernia. 7. Small Bochdalek's left diaphragmatic hernia. 8. Aortic atherosclerosis. Electronically Signed   By: Burman Nieves M.D.   On: 01/22/2023 21:36    Medications: I have reviewed the patient's current medications.  CODE STATUS: No CODE BLUE  Assessment/Plan: This is a very pleasant 77 years old African-American male with stage IV low-grade neuroendocrine carcinoma, carcinoid tumor presented with large right lower lobe lung mass in addition to right hilar lymphadenopathy and liver metastasis in April 2018.  The patient status post several treatment and his last treatment with Xeloda and Temodar was in March 2024 and he has been in observation since that time.  His last CT scan of the chest, abdomen and pelvis on Jan 08, 2023 showed no evidence for disease progression. The patient presented to the hospital with worsening dyspnea as well as fever and chills. Imaging studies showed right lower lobe/infrahilar mass with associated consolidation.  And since the previous study there was increasing right pleural effusion with increasing infiltration of the right lung that could represent disease progression or development of superimposed pneumonia. He had ultrasound guided thoracentesis with drainage of pleural fluid that was tested negative for malignancy. It is unlikely that the patient had disease progression of a slowly growing well-differentiated neuroendocrine carcinoma, carcinoid tumor in less than 2 weeks.  This is  likely superimposed pneumonia that need to be treated aggressively. He may need repeat CT scan of the abdomen and pelvis for evaluation of his abdominal pain and to rule out disease progression in the abdomen. I would recommend treating the patient for pneumonia but I will continue to monitor him for any signs that could represent disease progression. Even if he has disease progression, he is not currently on any good shape to start systemic chemotherapy.  This will be done on outpatient basis after stabilization of his condition. Palliative care consult for discussion of goals of care is appropriate. Thank you for taking good care of Richard Navarro.  Please call if you have any questions. Disclaimer: This note was dictated with voice recognition software. Similar sounding words can inadvertently be transcribed and may be missed upon review.    LOS: 4 days    Lajuana Matte 01/24/2023

## 2023-01-24 NOTE — Procedures (Signed)
Pleural Fibrinolytic Administration Procedure Note  Richard Navarro  [id number]  [birth year]  Date:01/24/23  Time:11:43 AM   Provider Performing:Demoni Gergen M Thora Lance   Procedure: Pleural Fibrinolysis Initial day [phone])  Indication(s) Fibrinolysis of complicated pleural effusion  Consent Risks of the procedure as well as the alternatives and risks of each were explained to the patient and/or caregiver.  Consent for the procedure was obtained.   Anesthesia None   Time Out Verified patient identification, verified procedure, site/side was marked, verified correct patient position, special equipment/implants available, medications/allergies/relevant history reviewed, required imaging and test results available.   Sterile Technique Hand hygiene, gloves   Procedure Description Existing pleural catheter was cleaned and accessed in sterile manner.  10mg  of tPA in 30cc of saline and 5mg  of dornase in 30cc of sterile water were injected into pleural space using existing pleural catheter.  Catheter will be clamped for 1 hour and then placed back to suction.   Complications/Tolerance None; patient tolerated the procedure well.  EBL None   Specimen(s) None

## 2023-01-24 NOTE — Progress Notes (Signed)
SLP Cancellation Note  Patient Details Name: Richard Navarro MRN: [id number] DOB: 01/16/1946   Cancelled treatment:       Reason Eval/Treat Not Completed: Other (comment);Medical issues which prohibited therapy (pt transfer to Stepdown due to agitation, discomfort and increased redness in pleural fluid; being treated for delirium)  Rolena Infante, MS [facility] SLP Acute Rehab Services Office [phone]  Chales Abrahams 01/24/2023, 7:15 PM

## 2023-01-24 NOTE — Progress Notes (Signed)
PCCM INTERVAL PROGRESS NOTE  Called to bedside to evaluate patient for acute change in status. I unclamped his chest tube around 1215 with minimal drainage of the previously seen yellow fluid. Approximately 2 hours later I was called due to worsening shortness of breath, tachycardia, and hypoxia. Upon my arrival to the bedside, the patient did appear to be in some more distress. Not so much respiratory distress but more agitation, uncomfortable, and writhing around in the bed. He was indeed requiring more oxygen (8L) to maintain sats in the mid 90s. He reported worsening pressure in his right chest as well as SOB. Pleural fluid now becoming more red in appearance.   BP (!) 158/70   Pulse (!) 131   Temp 98.6 F (37 C) (Oral)   Resp (!) 24   Ht 6\' 3"  (1.905 m)   Wt 73.3 kg   SpO2 99%   BMI 20.20 kg/m   General:  thin elderly male in NAD Neuro:  Awake alert oriented but intermittent confusion HEENT:  [place]/AT, No JVD noted, PERRL Cardiovascular:  Tachy, regular, no MRG Lungs:  Diminished R base, but otherwise clear.  Abdomen:  Soft, non-distended Musculoskeletal:  No acute deformity Skin:  Intact, MMM  CXR reviewed: unchanged. Chest tube in place. R basilar opacification persists. Mild diffuse interstitial opacity.   Assessment/Plan:   Hypoxia: - Increase supplemental O2 - Consider high flow - Transfer to SDU for closer monitoring - Doubt he would tolerate non-invasive - Consider diuresis  Agitation/delirium - Start Precedex infusion - Patient refuses narcotic pain medication - RASS goal 0 to -1  Pleural effusion on the R - Continue chest tube to suction - CXR in am - Will reevaluate tomorrow whether further lytics are indicated.  [name], AGACNP-BC [facility]  See Amion for personal pager PCCM on call pager [phone] until 7pm. Please call Elink 7p-7a. [phone]  01/24/2023 4:57 PM

## 2023-01-25 ENCOUNTER — Inpatient Hospital Stay (HOSPITAL_COMMUNITY): Payer: Medicare Other

## 2023-01-25 DIAGNOSIS — G9341 Metabolic encephalopathy: Secondary | ICD-10-CM | POA: Diagnosis not present

## 2023-01-25 DIAGNOSIS — J9611 Chronic respiratory failure with hypoxia: Secondary | ICD-10-CM | POA: Diagnosis not present

## 2023-01-25 DIAGNOSIS — J189 Pneumonia, unspecified organism: Secondary | ICD-10-CM | POA: Diagnosis not present

## 2023-01-25 DIAGNOSIS — J449 Chronic obstructive pulmonary disease, unspecified: Secondary | ICD-10-CM | POA: Diagnosis not present

## 2023-01-25 DIAGNOSIS — J90 Pleural effusion, not elsewhere classified: Secondary | ICD-10-CM | POA: Diagnosis not present

## 2023-01-25 LAB — CBC WITH DIFFERENTIAL/PLATELET
Abs Immature Granulocytes: 0.06 10*3/uL (ref 0.00–0.07)
Basophils Absolute: 0 10*3/uL (ref 0.0–0.1)
Basophils Relative: 1 %
Eosinophils Absolute: 0 10*3/uL (ref 0.0–0.5)
Eosinophils Relative: 0 %
HCT: 31.6 % — ABNORMAL LOW (ref 39.0–52.0)
Hemoglobin: 9.9 g/dL — ABNORMAL LOW (ref 13.0–17.0)
Immature Granulocytes: 1 %
Lymphocytes Relative: 6 %
Lymphs Abs: 0.5 10*3/uL — ABNORMAL LOW (ref 0.7–4.0)
MCH: 30.3 pg (ref 26.0–34.0)
MCHC: 31.3 g/dL (ref 30.0–36.0)
MCV: 96.6 fL (ref 80.0–100.0)
Monocytes Absolute: 0.6 10*3/uL (ref 0.1–1.0)
Monocytes Relative: 8 %
Neutro Abs: 6.6 10*3/uL (ref 1.7–7.7)
Neutrophils Relative %: 84 %
Platelets: 220 10*3/uL (ref 150–400)
RBC: 3.27 MIL/uL — ABNORMAL LOW (ref 4.22–5.81)
RDW: 15.7 % — ABNORMAL HIGH (ref 11.5–15.5)
WBC: 7.8 10*3/uL (ref 4.0–10.5)
nRBC: 0 % (ref 0.0–0.2)

## 2023-01-25 LAB — COMPREHENSIVE METABOLIC PANEL
ALT: 55 U/L — ABNORMAL HIGH (ref 0–44)
AST: 51 U/L — ABNORMAL HIGH (ref 15–41)
Albumin: 2.6 g/dL — ABNORMAL LOW (ref 3.5–5.0)
Alkaline Phosphatase: 62 U/L (ref 38–126)
Anion gap: 12 (ref 5–15)
BUN: 25 mg/dL — ABNORMAL HIGH (ref 8–23)
CO2: 19 mmol/L — ABNORMAL LOW (ref 22–32)
Calcium: 8.5 mg/dL — ABNORMAL LOW (ref 8.9–10.3)
Chloride: 104 mmol/L (ref 98–111)
Creatinine, Ser: 1.71 mg/dL — ABNORMAL HIGH (ref 0.61–1.24)
GFR, Estimated: 40 mL/min — ABNORMAL LOW (ref >60)
Glucose, Bld: 217 mg/dL — ABNORMAL HIGH (ref 70–99)
Potassium: 3.7 mmol/L (ref 3.5–5.1)
Sodium: 135 mmol/L (ref 135–145)
Total Bilirubin: 0.7 mg/dL (ref 0.3–1.2)
Total Protein: 7.2 g/dL (ref 6.5–8.1)

## 2023-01-25 LAB — URINALYSIS, COMPLETE (UACMP) WITH MICROSCOPIC
Bacteria, UA: NONE SEEN
Bilirubin Urine: NEGATIVE
Glucose, UA: NEGATIVE mg/dL
Ketones, ur: NEGATIVE mg/dL
Nitrite: NEGATIVE
Protein, ur: 100 mg/dL — AB
RBC / HPF: >50 RBC/hpf (ref 0–5)
Specific Gravity, Urine: 1.025 (ref 1.005–1.030)
pH: 5 (ref 5.0–8.0)

## 2023-01-25 LAB — CULTURE, BLOOD (ROUTINE X 2): Culture: NO GROWTH

## 2023-01-25 LAB — GLUCOSE, CAPILLARY
Glucose-Capillary: 173 mg/dL — ABNORMAL HIGH (ref 70–99)
Glucose-Capillary: 151 mg/dL — ABNORMAL HIGH (ref 70–99)
Glucose-Capillary: 139 mg/dL — ABNORMAL HIGH (ref 70–99)
Glucose-Capillary: 129 mg/dL — ABNORMAL HIGH (ref 70–99)
Glucose-Capillary: 135 mg/dL — ABNORMAL HIGH (ref 70–99)

## 2023-01-25 MED ORDER — ORAL CARE MOUTH RINSE: 15.0000 mL | OROMUCOSAL | Status: DC | PRN

## 2023-01-25 MED ORDER — POTASSIUM CHLORIDE CRYS ER 20 MEQ PO TBCR
40.0000 meq | EXTENDED_RELEASE_TABLET | Freq: Once | ORAL | Status: AC
  Administered 2023-01-25: 40 meq via ORAL
  Filled 2023-01-25: qty 2
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
## 2023-01-25 NOTE — Progress Notes (Signed)
SLP Cancellation Note  Patient Details Name: [name] MRN: [id number] DOB: [birth year]   Cancelled treatment:       Reason Eval/Treat Not Completed: Other (comment) (pt agitated overnight and on Precedex, note conversations regarding goals ongoig; will continue efforts)  Rolena Infante, MS [facility] SLP Acute Rehab Services Office [phone]  Chales Abrahams 01/25/2023, 7:35 AM

## 2023-01-25 NOTE — Progress Notes (Signed)
Palliative Medicine Progress Note   Patient Name: Richard Navarro       Date: 01/25/2023 DOB: 1946/08/06  Age: 77 y.o. MRN#: [id number] Attending Physician: Richard Person, MD Primary Care Physician: Richard Majestic, MD Admit Date: 01/20/2023    HPI/Patient Profile: 77 y.o. male  with past medical history of high-grade neuroendocrine carcinoma with right lower lobe mass and mets to the liver.  This was diagnosed in 2018.  He has been on chemotherapy up until March of this year; it has been on hold due to persistent fatigue and productive cough.  He presented to the ED on 01/20/2023 with complaint of cough and shortness of breath.  Imaging showed right lower lobe infiltrate and right-sided pleural effusion.  On 6/2, he underwent thoracentesis yielding 550 mL of hazy yellow fluid consistent with exudate.   Subjective: Chart reviewed. Note chest tube was removed today.   Bedside visit. Patient is OOB to the recliner, eating dinner. Precedex is off. His mental status is improved from yesterday, but he continues to have some paranoia.   I spoke with Richard Navarro (ex-wife) by phone. She agrees that patient's mental status is improved today. Discussed that per oncology (Dr. Arbutus Navarro), patient's condition is likely superimposed pneumonia and unlikely to be disease progression.   Discussed that patient worked with PT today and current recommendation is for SNF/rehab. Richard Navarro expresses some concern about placing patient in SNF (even short-term), as this has been a fear for him. She wants to know if PT at home would be an option. I offered to reach out to PT and [facility] tomorrow to discuss.    Objective:  Physical Exam Vitals reviewed.  Constitutional:      General: He is not in acute distress.    Comments:  Chronically ill-appearing  Pulmonary:     Effort: Tachypnea present.  Neurological:     Mental Status: He is alert.  Psychiatric:        Thought Content: Thought content is paranoid.             Vital Signs: BP 138/68 (BP Location: Right Arm)   Pulse 72   Temp (!) 97.2 F (36.2 C) (Axillary)   Resp (!) 25   Ht 6\' 3"  (1.905 m)   Wt 73.3 kg   SpO2 96%   BMI 20.20  kg/m  SpO2: SpO2: 96 % O2 Device: O2 Device: Nasal Cannula O2 Flow Rate: O2 Flow Rate (L/min): 2 L/min    Palliative Medicine Assessment & Plan   Assessment: Principal Problem:   HCAP (healthcare-associated pneumonia) Active Problems:   Diabetes mellitus with renal manifestation (HCC)   COPD (chronic obstructive pulmonary disease) (HCC)   Lobar pneumonia (HCC)   Neuroendocrine carcinoma (HCC)   Chronic kidney disease (CKD) stage G3b/A1, moderately decreased glomerular filtration rate (GFR) between 30-44 mL/min/1.73 square meter and albuminuria creatinine ratio less than 30 mg/g (HCC)   Type II diabetes mellitus with peripheral circulatory disorder (HCC)   Chronic hypoxemic respiratory failure (HCC)   Pleural effusion exudative    Recommendations/Plan: Continue current interventions Family is hopeful for stabilization and improvement PMT will continue to follow  Code Status: DNR/DNI   Prognosis:  Unable to determine  Discharge Planning: To Be Determined    Thank you for allowing the Palliative Medicine Team to assist in the care of this patient.   MDM - moderate   Richard Proud, NP   Please contact Palliative Medicine Team phone at [phone] for questions and concerns.  For individual providers, please see AMION.

## 2023-01-25 NOTE — Progress Notes (Signed)
Physical Therapy Treatment Patient Details Name: Richard Navarro MRN: [id number] DOB: 06/17/1946 Today's Date: 01/25/2023   History of Present Illness Pt is 77 yo male admitted 01/20/23 with HCAP and R pleural effusion.  He is s/p R thoracentesis on 01/22/23. Pt with hx including but not limited to DM2, CKD, resp failure on 2 L at home, neuroendocrine carcinoma, lung mass with liver metastasis currently on chemo.    PT Comments    Pt seen in SDU RM # [id number] General Comments: AxO x 3 slow to respond but pleasant.  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  NO children. Assisted OOB to amb required increased time and + 2 assist for safety.   General Gait Details: tolerated an increased distance of 32 feet on 2 lts oxygen with sats decreased from 96% @ rest to 88%.  RR increased to 38 and c/o dyspnea.  Recliner following for safety.   LPT rec [facility] Rehab at SNF however Pt "hopes" to D/C back home  Recommendations for follow up therapy are one component of a multi-disciplinary discharge planning process, led by the attending physician.  Recommendations may be updated based on patient status, additional functional criteria and insurance authorization.  Follow Up Recommendations       Assistance Recommended at Discharge Intermittent Supervision/Assistance  Patient can return home with the following A little help with walking and/or transfers;Assistance with cooking/housework;A little help with bathing/dressing/bathroom;Help with stairs or ramp for entrance   Equipment Recommendations  Rolling walker (2 wheels)    Recommendations for Other Services       Precautions / Restrictions Precautions Precautions: Fall Precaution Comments: monitor O2, on 2L/min at home "when needed" stated pt Restrictions Weight Bearing Restrictions: No     Mobility  Bed Mobility Overal bed mobility: Needs Assistance Bed Mobility: Supine to Sit     Supine to sit:  Min assist, Mod assist     General bed mobility comments: with increased time and LOB to the R side    Transfers Overall transfer level: Needs assistance Equipment used: Rolling walker (2 wheels) Transfers: Sit to/from Stand Sit to Stand: Min assist, +2 physical assistance           General transfer comment: with increased unsteadiness with transition VC's for proper hand placeemnt and safety with turns.    Ambulation/Gait Ambulation/Gait assistance: Min assist, +2 safety/equipment Gait Distance (Feet): 32 Feet Assistive device: Rolling walker (2 wheels) Gait Pattern/deviations: Step-through pattern, Decreased stride length Gait velocity: decreased     General Gait Details: tolerated an increased distance of 32 feet on 2 lts oxygen with sats decreased from 96% @ rest to 88%.  RR increased to 38 and c/o dyspnea.   Stairs             Wheelchair Mobility    Modified Rankin (Stroke Patients Only)       Balance                                            Cognition Arousal/Alertness: Awake/alert Behavior During Therapy: WFL for tasks assessed/performed Overall Cognitive Status: Within Functional Limits for tasks assessed                                 General Comments: AxO x 3 slow  to respond but pleasant.  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  NO children.        Exercises      General Comments        Pertinent Vitals/Pain Pain Assessment Pain Assessment: No/denies pain    Home Living                          Prior Function            PT Goals (current goals can now be found in the care plan section) Progress towards PT goals: Progressing toward goals    Frequency    Min 1X/week      PT Plan Current plan remains appropriate    Co-evaluation              AM-PAC PT "6 Clicks" Mobility   Outcome Measure  Help needed turning from  your back to your side while in a flat bed without using bedrails?: A Little Help needed moving from lying on your back to sitting on the side of a flat bed without using bedrails?: A Little Help needed moving to and from a bed to a chair (including a wheelchair)?: A Lot Help needed standing up from a chair using your arms (e.g., wheelchair or bedside chair)?: A Lot Help needed to walk in hospital room?: A Lot Help needed climbing 3-5 steps with a railing? : A Lot 6 Click Score: 14    End of Session Equipment Utilized During Treatment: Oxygen;Gait belt Activity Tolerance: Patient limited by fatigue Patient left: with chair alarm set;in chair;with call bell/phone within reach Nurse Communication: Mobility status PT Visit Diagnosis: Other abnormalities of gait and mobility (R26.89);Muscle weakness (generalized) (M62.81)     Time: 1610-9604 PT Time Calculation (min) (ACUTE ONLY): 35 min  Charges:  $Gait Training: 8-22 mins $Therapeutic Activity: 8-22 mins                     Felecia Shelling  PTA Acute  Rehabilitation Services Office M-F          [phone]

## 2023-01-25 NOTE — Progress Notes (Addendum)
Speech Language Pathology Treatment: Dysphagia  Patient Details Name: Richard Navarro MRN: [id number] DOB: 28-Feb-1946 Today's Date: 01/25/2023 Time: 4098-1191 SLP Time Calculation (min) (ACUTE ONLY): 16 min  Assessment / Plan / Recommendation Clinical Impression  Patient session focused on dysphagia management. SLP reviewed latest esophagram with him as well with his nurse, Rulon Eisenmenger and [employer]. Observed patient consuming few bites of chicken and drinking coffee. No indication of aspiration or dysphagia but patient only consumed a few bites before he began speaking off topic and could not be adequately redirected. Patient does endorse issues with feeling like he "could not swallow anymore" during his lunch today. He did advise that he was able to get some warm coffee down after having this sensation. Question if inability to swallow sensation is secondary to known hiatal hernia and potential show food stasis in that area. Patient denies having issues with liquids. At this point there are no indications of oropharyngeal dysphagia and SLP educated patient to General precautions for esophageal dysfunction. Appears patient has some sensation to potential esophageal dysfunction thus recommend continue diet as tolerated. He does mention an issues with poor dentition and thus may benefit from having me minced if he desires. No SLP follow-up at this time. Thanks for allowing this SLP to assist with his care plan.   Patient does advise that his dysphagia has only started since his treatment for cancer causing SLP to question potential iatrogenic effects of treatment on his esophageal function and if he may benefit from potential medical management.  Today he reported discomfort isolating to the left side of his pharynx when swallowing, states this was new this morning.  However patient not observed to have overt indication of odynophagia with p.o. intake.     HPI HPI: Richard Navarro is a 77 yo male adm to [facility]  with fatigue/cough.  Pt has h/o NIDDM2, respiratory failure, right LL lung mass with liver mets - 11/2016, was on chemo which has been put on hold since 10/2022 due to fatigue.  Pt has h/o dysphagia s/p endoscopy with dilation 07/2021, prior DG esophagus showed disruption of primary peristaltic wave - but secondary and tertiary effective.  He is s/p thoracentesis 6/2 and chest tube was placed today 01/23/2023.  Pt familiar to this SLP from prior visit - where pt scored high on Reflux Symptom Index *28/45 score - when above 10 is highly indicative of laryngopharyngeal reflux.  Pt appeared frustrated during session reporting that he has had "barium swallows" and many tests.  He admits to dysphagia to some solids.  SLP inquired if his current dysphagia is consistent with symptoms prior to dilation - to which he confirmed.  He denies issues swallowing pills or liquids.  Patient is status post esophagram during this hospital stay which showed barium tablet lodging in hiatal hernia and not clearing by completion of the study.      SLP Plan  All goals met      Recommendations for follow up therapy are one component of a multi-disciplinary discharge planning process, led by the attending physician.  Recommendations may be updated based on patient status, additional functional criteria and insurance authorization.    Recommendations  Diet recommendations: Regular;Dysphagia 3 (mechanical soft);Thin liquid Liquids provided via: Cup;Straw Medication Administration: Whole meds with liquid Supervision: Patient able to self feed Compensations: Slow rate;Small sips/bites Postural Changes and/or Swallow Maneuvers: Seated upright 90 degrees;Upright 30-60 min after meal  Oral care BID     Dysphagia, unspecified (R13.10)     All goals met   Rolena Infante, MS [facility] SLP Acute Rehab Services Office [phone]   Chales Abrahams  01/25/2023, 5:21 PM

## 2023-01-26 ENCOUNTER — Telehealth: Payer: Self-pay | Admitting: Student

## 2023-01-26 DIAGNOSIS — N1832 Chronic kidney disease, stage 3b: Secondary | ICD-10-CM | POA: Diagnosis not present

## 2023-01-26 DIAGNOSIS — E1151 Type 2 diabetes mellitus with diabetic peripheral angiopathy without gangrene: Secondary | ICD-10-CM

## 2023-01-26 DIAGNOSIS — J189 Pneumonia, unspecified organism: Secondary | ICD-10-CM | POA: Diagnosis not present

## 2023-01-26 DIAGNOSIS — J90 Pleural effusion, not elsewhere classified: Secondary | ICD-10-CM | POA: Diagnosis not present

## 2023-01-26 LAB — BASIC METABOLIC PANEL
Anion gap: 10 (ref 5–15)
BUN: 21 mg/dL (ref 8–23)
CO2: 22 mmol/L (ref 22–32)
Calcium: 8.6 mg/dL — ABNORMAL LOW (ref 8.9–10.3)
Chloride: 103 mmol/L (ref 98–111)
Creatinine, Ser: 1.4 mg/dL — ABNORMAL HIGH (ref 0.61–1.24)
GFR, Estimated: 51 mL/min — ABNORMAL LOW (ref >60)
Glucose, Bld: 168 mg/dL — ABNORMAL HIGH (ref 70–99)
Potassium: 3.7 mmol/L (ref 3.5–5.1)
Sodium: 135 mmol/L (ref 135–145)

## 2023-01-26 LAB — GLUCOSE, CAPILLARY
Glucose-Capillary: 172 mg/dL — ABNORMAL HIGH (ref 70–99)
Glucose-Capillary: 126 mg/dL — ABNORMAL HIGH (ref 70–99)
Glucose-Capillary: 151 mg/dL — ABNORMAL HIGH (ref 70–99)
Glucose-Capillary: 137 mg/dL — ABNORMAL HIGH (ref 70–99)

## 2023-01-26 LAB — CBC
HCT: 35 % — ABNORMAL LOW (ref 39.0–52.0)
Hemoglobin: 11.1 g/dL — ABNORMAL LOW (ref 13.0–17.0)
MCH: 30.2 pg (ref 26.0–34.0)
MCHC: 31.7 g/dL (ref 30.0–36.0)
MCV: 95.1 fL (ref 80.0–100.0)
Platelets: 277 10*3/uL (ref 150–400)
RBC: 3.68 MIL/uL — ABNORMAL LOW (ref 4.22–5.81)
RDW: 15.9 % — ABNORMAL HIGH (ref 11.5–15.5)
WBC: 9.2 10*3/uL (ref 4.0–10.5)
nRBC: 0 % (ref 0.0–0.2)

## 2023-01-26 LAB — HEPATIC FUNCTION PANEL
ALT: 47 U/L — ABNORMAL HIGH (ref 0–44)
AST: 30 U/L (ref 15–41)
Albumin: 2.6 g/dL — ABNORMAL LOW (ref 3.5–5.0)
Alkaline Phosphatase: 66 U/L (ref 38–126)
Bilirubin, Direct: <0.1 mg/dL (ref 0.0–0.2)
Total Bilirubin: 0.7 mg/dL (ref 0.3–1.2)
Total Protein: 7.8 g/dL (ref 6.5–8.1)

## 2023-01-26 MED ORDER — POTASSIUM CHLORIDE CRYS ER 20 MEQ PO TBCR: 40.0000 meq | EXTENDED_RELEASE_TABLET | Freq: Once | ORAL | Status: DC

## 2023-01-26 MED ORDER — POTASSIUM CHLORIDE 20 MEQ PO PACK
40.0000 meq | PACK | Freq: Once | ORAL | Status: AC
  Administered 2023-01-26: 40 meq via ORAL
  Filled 2023-01-26: qty 2

## 2023-01-26 NOTE — Telephone Encounter (Signed)
Patient is scheduled with Dr. Delton Coombes 7/17 at 10:30am. Reminder mailed to address on file. Nothing further needed

## 2023-01-26 NOTE — Progress Notes (Signed)
eLink Physician-Brief Progress Note Patient Name: Richard Navarro DOB: June 04, 1946 MRN: [id number]   Date of Service  01/26/2023  HPI/Events of Note  77 year old man with a history of low-grade neuroendocrine carcinoma medical screening and large right lower lobe mass with left-sided exudative pleural effusion undergoing fibrinolytics  K3.7, creatinine 1.4 (CKD 3)   eICU Interventions  KCl p.o. ordered     Intervention Category Minor Interventions: Electrolytes abnormality - evaluation and management  Laurie Lovejoy 01/26/2023, 5:55 AM

## 2023-01-26 NOTE — Progress Notes (Signed)
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 This was diagnosed in 2018 and has has been on chemotherapy with Xeloda and temodar up until March of this year, when it was held due to persistent fatigue and productive cough.  He presented to [facility] ED 6/1 with complaints of cough and shortness of breath. X-ray in the ED showed right lower lobe infiltrate. CTA chest showed right sided pleural effusion as well. He was admitted to the hospitalist service for treatment on pneumonia with antibiotics. He underwent thoracentesis yielding 550 cc of hazy yellow fluid. Consistent with exudate. CT chest repeated after thoracentesis and a moderate right pleural effusion remained despite drainage.  Patient was transferred to ICU under CCM service.  Underwent chest tube placement on 01/23/2023, chest tube removed on 01/25/2023. TRH resumed care on 01/26/2023   Assessment/Plan:   Postobstructive pneumonia/parapneumonic effusion -Presented with right loculated acute due to effusion; parapneumonic -Underwent thoracentesis and then chest tube placement per PCCM; chest tube removed -Currently on 4-week course of antibiotics with Unasyn   Acute metabolic encephalopathy -Multifactorial from pneumonia, chest tube, urinary retention, ICU delirium -Was started on Precedex, being weaned off -Continue checking bladder scan every 6 hours  Abdominal pain -Likely in setting of acute urinary retention  Chronic respiratory failure with hypoxemia -Chronically on 2 L of oxygen at home -Has COPD, continue Brovana, Incruse  Stage IV neuroendocrine carcinoma -Chemotherapy has been on hold -Oncology following  CKD stage IIIb -Creatinine at baseline    Medications      allopurinol  100 mg Oral Daily   amLODipine  10 mg Oral QPM   arformoterol  15 mcg Nebulization BID   And   umeclidinium bromide  1 puff Inhalation Daily   ascorbic acid  500 mg Oral Daily   Chlorhexidine Gluconate Cloth  6 each Topical Daily   cholecalciferol  1,000 Units Oral Daily   cyanocobalamin  1,000 mcg Oral Daily   docusate sodium  100 mg Oral BID   enoxaparin (LOVENOX) injection  40 mg Subcutaneous Q24H   guaiFENesin  600 mg Oral BID   insulin aspart  0-9 Units Subcutaneous TID WC   lidocaine  1 patch Transdermal Q24H   metoprolol tartrate  25 mg Oral BID   pantoprazole  40 mg Oral BID   polyethylene glycol  17 g Oral BID     Data Reviewed:   CBG:  Recent Labs  Lab 01/25/23 0806 01/25/23 1156 01/25/23 1625 01/25/23 1951 01/25/23 2207  GLUCAP 173* 151* 139* 129* 135*    SpO2: 98 % O2 Flow Rate (L/min): 2 L/min FiO2 (%): 28 %    Vitals:   01/26/23 0000 01/26/23 0200 01/26/23 0400 01/26/23 0600  BP: 127/60 (!) 141/67 (!) 156/69 (!) 165/98  Pulse: 91 95 100 (!) 112  Resp: (!) 27 (!) 26 (!) 24 15  Temp: 98.8 F (37.1 C)  98.2 F (36.8 C)   TempSrc: Oral  Oral   SpO2: 98% 100% 97% 98%  Weight:      Height:          Data Reviewed:  Basic Metabolic Panel: Recent Labs  Lab 01/22/23 0537 01/23/23 0524 01/23/23 0852 01/24/23 0902 01/25/23 0320 01/26/23 0307  NA 132*  --  132* 137 135 135  K 3.3*  --  3.8 3.5 3.7 3.7  CL 106  --  105 106 104 103  CO2 18*  --  18* 19* 19* 22  GLUCOSE 160*  --  210* 159* 217* 168*  BUN 21  --  21 19 25* 21  CREATININE 1.58* 1.59* 1.62* 1.62* 1.71* 1.40*  CALCIUM 8.3*  --  8.7* 9.1 8.5* 8.6*  MG 1.9  --   --   --   --   --     CBC: Recent Labs  Lab 01/21/23 0517 01/22/23 0537 01/23/23 0852 01/24/23 0902 01/24/23 1436 01/25/23 0320 01/26/23 0307  WBC 14.9* 12.1* 13.6* 11.0*  --  7.8 9.2  NEUTROABS 13.4* 10.5* 12.0* 8.7*  --  6.6  --   HGB 12.7* 11.1* 11.0* 11.5* 11.3* 9.9* 11.1*  HCT 40.4 35.6* 34.3*  35.7* 35.3* 31.6* 35.0*  MCV 96.4 97.3 94.0 93.5  --  96.6 95.1  PLT 251 209 247 262  --  220 277    LFT Recent Labs  Lab 01/22/23 0537 01/23/23 0852 01/24/23 0902 01/25/23 0320 01/26/23 0307  AST 14* 33 49* 51* 30  ALT 19 34 50* 55* 47*  ALKPHOS 54 68 72 62 66  BILITOT 0.9 0.5 0.8 0.7 0.7  PROT 7.4 7.7 8.5* 7.2 7.8  ALBUMIN 2.9* 2.8* 2.9* 2.6* 2.6*     Antibiotics: Anti-infectives (From admission, onward)    Start     Dose/Rate Route Frequency Ordered Stop   01/24/23 0000  Ampicillin-Sulbactam (UNASYN) 3 g in sodium chloride 0.9 % 100 mL IVPB        3 g 200 mL/hr over 30 Minutes Intravenous Every 6 hours 01/23/23 1335     01/21/23 1800  vancomycin (VANCOCIN) IVPB 1000 mg/200 mL premix  Status:  Discontinued        1,000 mg 200 mL/hr over 60 Minutes Intravenous Every 24 hours 01/20/23 1908 01/23/23 1329   01/21/23 0000  ceFEPIme (MAXIPIME) 2 g in sodium chloride 0.9 % 100 mL IVPB  Status:  Discontinued        2 g 200 mL/hr over 30 Minutes Intravenous Every 12 hours 01/20/23 1908 01/23/23 1335   01/20/23 1945  cefTRIAXone (ROCEPHIN) 1 g in sodium chloride 0.9 % 100 mL IVPB  Status:  Discontinued        1 g 200 mL/hr over 30 Minutes Intravenous Every 24 hours 01/20/23 1852 01/20/23 1908   01/20/23 1630  vancomycin (VANCOREADY) IVPB 1500 mg/300 mL        1,500 mg 150 mL/hr over 120 Minutes Intravenous  Once 01/20/23 1625 01/20/23 1916   01/20/23 1630  piperacillin-tazobactam (ZOSYN) IVPB 3.375 g        3.375 g 100 mL/hr over 30 Minutes Intravenous  Once 01/20/23 1625 01/20/23 1713        DVT prophylaxis: Lovenox  Code Status: DNR  Family Communication: No family at bedside   CONSULTS PCCM   Subjective   Patient seen and examined, denies shortness of breath.   Objective    Physical Examination:   General-appears in no acute distress Heart-S1-S2, regular, no murmur auscultated Lungs-clear to auscultation bilaterally, no wheezing or crackles  auscultated Abdomen-soft, nontender, no organomegaly Extremities-no edema in the lower extremities Neuro-alert, oriented x3, no focal deficit noted  Status is: Inpatient:             Meredeth Ide   [facility] If 7PM-7AM, please contact night-coverage at [email or web address],  Office  [phone]   01/26/2023, 8:03 AM  LOS: 6 days

## 2023-01-26 NOTE — Telephone Encounter (Signed)
Can we arrange clinic follow up for him with any MD in 4 weeks? Needs CXR on same day as visit  Thanks!

## 2023-01-26 NOTE — Progress Notes (Signed)
OT Cancellation Note  Patient Details Name: Richard Navarro MRN: [id number] DOB: Dec 28, 1945   Cancelled Treatment:    Reason Eval/Treat Not Completed: Patient declined, stating his "body is wore out, and there's nothing that can be done for me."  Suzanna Obey 01/26/2023, 2:13 PM 01/26/2023  RP, OTR/L  Acute Rehabilitation Services  Office:  [phone]

## 2023-01-26 NOTE — Progress Notes (Signed)
Brief PCCM Progress Note  # R parapneumonic effusion # Postobstructive pneumonia # COPD, chronic hypoxic respiratory failure - would continue augmentin as outpatient to complete 4 week total course of ABX - I will arrange clinic follow up for him - walking oximetry before discharge - resume stiolto 2 puffs daily at discharge  We will sign off but glad to be reinvolved as condition changes  Laroy Apple Pulmonary/Critical Care

## 2023-01-27 ENCOUNTER — Other Ambulatory Visit (HOSPITAL_COMMUNITY): Payer: Self-pay

## 2023-01-27 DIAGNOSIS — J9611 Chronic respiratory failure with hypoxia: Secondary | ICD-10-CM | POA: Diagnosis not present

## 2023-01-27 DIAGNOSIS — E1151 Type 2 diabetes mellitus with diabetic peripheral angiopathy without gangrene: Secondary | ICD-10-CM | POA: Diagnosis not present

## 2023-01-27 DIAGNOSIS — N1832 Chronic kidney disease, stage 3b: Secondary | ICD-10-CM | POA: Diagnosis not present

## 2023-01-27 DIAGNOSIS — J189 Pneumonia, unspecified organism: Secondary | ICD-10-CM | POA: Diagnosis not present

## 2023-01-27 LAB — CBC
HCT: 34 % — ABNORMAL LOW (ref 39.0–52.0)
Hemoglobin: 10.6 g/dL — ABNORMAL LOW (ref 13.0–17.0)
MCH: 29.5 pg (ref 26.0–34.0)
MCHC: 31.2 g/dL (ref 30.0–36.0)
MCV: 94.7 fL (ref 80.0–100.0)
Platelets: 289 10*3/uL (ref 150–400)
RBC: 3.59 MIL/uL — ABNORMAL LOW (ref 4.22–5.81)
RDW: 16.3 % — ABNORMAL HIGH (ref 11.5–15.5)
WBC: 7.9 10*3/uL (ref 4.0–10.5)
nRBC: 0 % (ref 0.0–0.2)

## 2023-01-27 LAB — BASIC METABOLIC PANEL
Anion gap: 10 (ref 5–15)
BUN: 18 mg/dL (ref 8–23)
CO2: 23 mmol/L (ref 22–32)
Calcium: 8.5 mg/dL — ABNORMAL LOW (ref 8.9–10.3)
Chloride: 102 mmol/L (ref 98–111)
Creatinine, Ser: 1.36 mg/dL — ABNORMAL HIGH (ref 0.61–1.24)
GFR, Estimated: 53 mL/min — ABNORMAL LOW (ref >60)
Glucose, Bld: 141 mg/dL — ABNORMAL HIGH (ref 70–99)
Potassium: 4.1 mmol/L (ref 3.5–5.1)
Sodium: 135 mmol/L (ref 135–145)

## 2023-01-27 LAB — GLUCOSE, CAPILLARY
Glucose-Capillary: 120 mg/dL — ABNORMAL HIGH (ref 70–99)
Glucose-Capillary: 135 mg/dL — ABNORMAL HIGH (ref 70–99)

## 2023-01-27 LAB — BODY FLUID CULTURE W GRAM STAIN

## 2023-01-27 MED ORDER — BISACODYL 10 MG RE SUPP
10.0000 mg | Freq: Once | RECTAL | Status: AC
  Administered 2023-01-27: 10 mg via RECTAL
  Filled 2023-01-27: qty 1

## 2023-01-27 MED ORDER — AMOXICILLIN-POT CLAVULANATE 875-125 MG PO TABS
1.0000 | ORAL_TABLET | Freq: Two times a day (BID) | ORAL | 0 refills | Status: AC
  Filled 2023-01-27: qty 46, 23d supply, fill #0

## 2023-01-27 MED ORDER — ACETAMINOPHEN 325 MG PO TABS: 650.0000 mg | ORAL_TABLET | Freq: Four times a day (QID) | ORAL | Status: DC | PRN

## 2023-01-27 MED ORDER — AMOXICILLIN-POT CLAVULANATE 875-125 MG PO TABS: 1.0000 | ORAL_TABLET | Freq: Two times a day (BID) | ORAL | 0 refills | Status: DC

## 2023-01-27 MED ORDER — ALPRAZOLAM 0.5 MG PO TABS: 0.5000 mg | ORAL_TABLET | Freq: Every day | ORAL | 0 refills | Status: DC | PRN

## 2023-01-27 NOTE — Discharge Summary (Addendum)
Physician Discharge Summary   Patient: Richard Navarro MRN: [id number] DOB: 02/26/1946  Admit date:     01/20/2023  Discharge date: 01/27/23  Discharge Physician: Meredeth Ide   PCP: Shelva Majestic, MD   Recommendations at discharge:   Follow-up PCP in 1 week Follow-up pulmonology as outpatient Patient to go home with home health PT  Discharge Diagnoses: Principal Problem:   HCAP (healthcare-associated pneumonia) Active Problems:   Diabetes mellitus with renal manifestation (HCC)   COPD (chronic obstructive pulmonary disease) (HCC)   Lobar pneumonia (HCC)   Neuroendocrine carcinoma (HCC)   Chronic kidney disease (CKD) stage G3b/A1, moderately decreased glomerular filtration rate (GFR) between 30-44 mL/min/1.73 square meter and albuminuria creatinine ratio less than 30 mg/g (HCC)   Type II diabetes mellitus with peripheral circulatory disorder (HCC)   Chronic hypoxemic respiratory failure (HCC)   Pleural effusion exudative  Resolved Problems:   * No resolved hospital problems. 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 This was diagnosed in 2018 and has has been on chemotherapy with Xeloda and temodar up until March of this year, when it was held due to persistent fatigue and productive cough.  He presented to [facility] ED 6/1 with complaints of cough and shortness of breath. X-ray in the ED showed right lower lobe infiltrate. CTA chest showed right sided pleural effusion as well. He was admitted to the hospitalist service for treatment on pneumonia with antibiotics. He underwent thoracentesis yielding 550 cc of hazy yellow fluid. Consistent with exudate. CT chest repeated after thoracentesis and a moderate right pleural effusion remained despite drainage.  Patient was transferred to ICU under CCM service.  Underwent chest tube placement on  01/23/2023, chest tube removed on 01/25/2023. TRH resumed care on 01/26/2023  Assessment and Plan:  Postobstructive pneumonia/parapneumonic effusion -Presented with right loculated acute due to effusion; parapneumonic -Underwent thoracentesis and then chest tube placement per PCCM; chest tube removed -Currently on 4-week course of antibiotics with Unasyn  -Will be discharged Augmentin for 23 more days to complete 4-week treatment with antibiotics.  Last dose of antibiotics 02/19/2023. -Follow-up pulmonology as outpatient   Acute metabolic encephalopathy -Resolved -Multifactorial from pneumonia, chest tube, urinary retention, ICU delirium -Was started on Precedex, being weaned off -Foley catheter was removed   Abdominal pain/urinary tension -Likely in setting of acute urinary retention  -Foley catheter was inserted for urinary retention, now removed -Patient voiding well  Chronic respiratory failure with hypoxemia -Chronically on 2 L of oxygen at home -Has COPD, continue Brovana, Incruse   Stage IV neuroendocrine carcinoma -Chemotherapy has been on hold -Oncology following   CKD stage IIIb -Creatinine at baseline      anxiety -will give xanax 0.5 mg daily prn for 5 tablets        Consultants: PCCM Procedures performed:  Disposition: Home Diet recommendation:  Discharge Diet Orders (From admission, onward)     Start     Ordered   01/27/23 0000  Diet - low sodium heart healthy        01/27/23 1158           Regular diet DISCHARGE MEDICATION: Allergies as of 01/27/2023       Reactions   Lisinopril Swelling   Angioedema- on this and afinitor same time   Simvastatin Other (See Comments)   Joint ache  Medication List     TAKE these medications    acetaminophen 325 MG tablet Commonly known as: Tylenol Take 2 tablets (650 mg total) by mouth every 6 (six) hours as needed for moderate pain or fever.   albuterol 108 (90 Base) MCG/ACT inhaler Commonly  known as: VENTOLIN HFA Inhale 2 puffs into the lungs every 4 (four) hours as needed for wheezing or shortness of breath.   allopurinol 100 MG tablet Commonly known as: ZYLOPRIM TAKE 1 TABLET DAILY   ALPRAZolam 0.5 MG tablet Commonly known as: Xanax Take 1 tablet (0.5 mg total) by mouth daily as needed for anxiety.   amLODipine 10 MG tablet Commonly known as: NORVASC TAKE 1 TABLET DAILY What changed: when to take this   amoxicillin-clavulanate 875-125 MG tablet Commonly known as: AUGMENTIN Take 1 tablet by mouth 2 (two) times daily for 23 days.   ascorbic acid 500 MG tablet Commonly known as: VITAMIN C Take 500 mg by mouth daily.   capecitabine 500 MG tablet Commonly known as: XELODA TAKE 3 TABLETS BY MOUTH TWICE DAILY FOR 14 DAYS EVERY 4 WEEKS.   cyanocobalamin 1000 MCG tablet Commonly known as: VITAMIN B12 Take 1,000 mcg by mouth daily.   Dexcom G7 Receiver Devi Please provide 1 receiver   Dexcom G7 Sensor Misc 1 Box by Does not apply route every 30 (thirty) days.   docusate sodium 100 MG capsule Commonly known as: COLACE Take 300 mg by mouth daily.   glipiZIDE 5 MG tablet Commonly known as: GLUCOTROL TAKE 1 TABLET DAILY BEFORE BREAKFAST   glucose blood test strip Use to test blood sugar twice a day   guaiFENesin 600 MG 12 hr tablet Commonly known as: MUCINEX Take 600 mg by mouth 2 (two) times daily.   Iron 325 (65 Fe) MG Tabs Take 325 mg by mouth every other day.   ketotifen 0.025 % ophthalmic solution Commonly known as: ZADITOR Place 1 drop into both eyes daily as needed (allergies).   magnesium hydroxide 400 MG/5ML suspension Commonly known as: MILK OF MAGNESIA Take 30 mLs by mouth daily as needed (constipation).   metoprolol tartrate 25 MG tablet Commonly known as: LOPRESSOR TAKE 1 TABLET TWICE A DAY   ondansetron 8 MG tablet Commonly known as: ZOFRAN TAKE 1 TABLET BY MOUTH EVERY 8 HOURS AS NEEDED FOR NAUSEA AND/OR VOMITING What changed:   how much to take reasons to take this   OXYGEN Inhale 2 L into the lungs as needed (SOB).   pantoprazole 40 MG tablet Commonly known as: PROTONIX Take 1 tablet (40 mg total) by mouth 2 (two) times daily.   polyethylene glycol 17 g packet Commonly known as: MIRALAX / GLYCOLAX Take 17 g by mouth daily.   Stiolto Respimat 2.5-2.5 MCG/ACT Aers Generic drug: Tiotropium Bromide-Olodaterol Inhale 2 puffs into the lungs daily.   temozolomide 100 MG capsule Commonly known as: TEMODAR Take 4 capsules by mouth daily on day 10, 11, 12, 13, and 14 every 4 weeks. Take on an empty stomach or at bedtime to decrease nausea or vomiting   Vitamin D3 25 MCG (1000 UT) Caps Take 1,000 Units by mouth daily.               Durable Medical Equipment  (From admission, onward)           Start     Ordered   01/23/23 0950  For home use only DME Walker rolling  Once       Question Answer  Comment  Walker: With 5 Inch Wheels   Patient needs a walker to treat with the following condition Generalized weakness      01/23/23 0950            Follow-up Information     [facility], [facility] Follow up.   Specialty: Home Health Services Why: Home Health Physical therapy and occupational therapy will be provided by [facility]. Someone will call you to arrange start date and time. Contact information: [address] [place] [address] [phone]         Shelva Majestic, MD Follow up in 1 week(s).   Specialty: Family Medicine Contact information: [address] [place] [address] [phone]         Omar Person, MD. Schedule an appointment as soon as possible for a visit.   Specialty: Pulmonary Disease Contact information: [address] [place] [address] [phone]                Discharge Exam: Ceasar Mons Weights   01/21/23 0143 01/24/23 1600  Weight: 76.2 kg 73.3 kg   General-appears in no acute  distress Heart-S1-S2, regular, no murmur auscultated Lungs-clear to auscultation bilaterally, no wheezing or crackles auscultated Abdomen-soft, nontender, no organomegaly Extremities-no edema in the lower extremities Neuro-alert, oriented x3, no focal deficit noted  Condition at discharge: good  The results of significant diagnostics from this hospitalization (including imaging, microbiology, ancillary and laboratory) are listed below for reference.   Imaging Studies: DG CHEST PORT 1 VIEW  Result Date: 01/25/2023 CLINICAL DATA:  Follow-up pleural effusion EXAM: PORTABLE CHEST 1 VIEW COMPARISON:  01/24/2023 FINDINGS: Pigtail catheter is again noted on the right. Mild right basilar atelectasis is seen. No sizable effusion is noted. No bony abnormality is seen. IMPRESSION: Stable right basilar pigtail catheter with associated atelectatic changes. Electronically Signed   By: Alcide Clever M.D.   On: 01/25/2023 02:55   DG Abd 1 View  Result Date: 01/24/2023 CLINICAL DATA:  Abdominal distention. Abdominal pain. Esophagram this morning. EXAM: ABDOMEN - 1 VIEW COMPARISON:  CT 01/08/2023.  Esophagram 01/24/2023. FINDINGS: Contrast material is demonstrated within normal caliber and normal-appearing left abdominal jejunal loops. Scattered stool in the colon. No small or large bowel distention. No radiopaque stones are visualized. Degenerative changes in the spine and hips. IMPRESSION: Residual contrast material within left abdominal jejunal loops. No evidence of bowel obstruction. Electronically Signed   By: Burman Nieves M.D.   On: 01/24/2023 18:02   DG CHEST PORT 1 VIEW  Result Date: 01/24/2023 CLINICAL DATA:  Hypoxia EXAM: PORTABLE CHEST 1 VIEW COMPARISON:  01/24/2023, 6:52 a.m. FINDINGS: Cardiomegaly. Unchanged pigtail chest tube about the right lung base with associated atelectasis or consolidation. No pneumothorax. Mild diffuse interstitial opacity, unchanged. Osseous structures unremarkable.  IMPRESSION: 1. Unchanged pigtail chest tube about the right lung base with associated atelectasis or consolidation. No pneumothorax. 2. Cardiomegaly and mild diffuse interstitial opacity, unchanged, likely edema. Electronically Signed   By: Jearld Lesch M.D.   On: 01/24/2023 14:43   DG ESOPHAGUS W SINGLE CM (SOL OR THIN BA)  Result Date: 01/24/2023 CLINICAL DATA:  Pneumonia. Chest tube in place. History of neuroendocrine tumor. EXAM: ESOPHOGRAM / BARIUM SWALLOW / BARIUM TABLET STUDY TECHNIQUE: single contrast examination performed using thin barium liquid. The patient was observed with fluoroscopy swallowing a 13 mm barium sulphate tablet. FLUOROSCOPY: Radiation Exposure Index (as provided by the fluoroscopic device): 15.7 mGy Kerma COMPARISON:  Chest radiograph 08/25/2022,  CT chest 01/20/2023 FINDINGS: Exam limited due to patient immobility.  Chest tube in place. No swallowing dysfunction in the high cervical esophagus. No mucosal irregularity, stricture or mass within the thoracic esophagus or distal esophagus. A small hiatal hernia present. 13 mm barium tablet remained within the small hiatal hernia at the termination of exam. IMPRESSION: 1. No esophageal stricture or mass.  Patent GE junction. 2. Small hiatal hernia. 3. Barium tablet trapped within the small hiatal hernia. Electronically Signed   By: Genevive Bi M.D.   On: 01/24/2023 11:49   DG CHEST PORT 1 VIEW  Result Date: 01/24/2023 CLINICAL DATA:  Follow-up pleural effusion EXAM: PORTABLE CHEST 1 VIEW COMPARISON:  Chest radiograph 1 day prior FINDINGS: The cardiomediastinal silhouette is stable. A right basilar chest tube remains in place. Linear opacities in the right base and a small right pleural effusion are unchanged. Linear opacities in the left lung base are also similar to the prior study, likely reflecting subsegmental atelectasis. There is no new or worsening focal airspace disease. There is no significant left effusion. There is no  pneumothorax There is no acute osseous abnormality. IMPRESSION: Right basilar chest tube remains in place with unchanged small residual right pleural effusion and adjacent patchy airspace opacity. Electronically Signed   By: Lesia Hausen M.D.   On: 01/24/2023 10:52   DG CHEST PORT 1 VIEW  Result Date: 01/23/2023 CLINICAL DATA:  [id number] Pleural effusion [id number] EXAM: PORTABLE CHEST - 1 VIEW COMPARISON:  the previous day's study FINDINGS: Pigtail catheter has been placed at the right lung base, with decrease in pleural effusion, small residual. No pneumothorax. Residual patchy infiltrate or atelectasis at the right lung base. Improved aeration at the left lung base. Heart size and mediastinal contours are within normal limits. Visualized bones unremarkable. IMPRESSION: Interval decrease in right pleural effusion following pigtail catheter placement. Electronically Signed   By: Corlis Leak M.D.   On: 01/23/2023 14:18   CT CHEST WO CONTRAST  Result Date: 01/22/2023 CLINICAL DATA:  Pneumonia with complication suspected.  X-ray done. EXAM: CT CHEST WITHOUT CONTRAST TECHNIQUE: Multidetector CT imaging of the chest was performed following the standard protocol without IV contrast. RADIATION DOSE REDUCTION: This exam was performed according to the departmental dose-optimization program which includes automated exposure control, adjustment of the mA and/or kV according to patient size and/or use of iterative reconstruction technique. COMPARISON:  Chest radiograph 01/22/2023.  CT chest 01/20/2023 FINDINGS: Cardiovascular: Normal heart size. Small pericardial effusion. Normal caliber thoracic aorta. Calcification of the aorta and coronary arteries. Mediastinum/Nodes: Thyroid gland is unremarkable. Enlarged mediastinal lymph nodes are present with pretracheal nodes measuring up to 11 mm short axis dimension. Lymphadenopathy is similar to prior study. Changes are nonspecific but could be reactive or metastatic. Esophagus is  decompressed. Lungs/Pleura: Persistent finding of mass and consolidation in the right lower lobe causing invasion or eccentric compression of the right lower lobe bronchus. Consolidation is increasing since prior study. Persistent moderate right pleural effusion is also mildly increased. Emphysematous changes in the lungs. Left lung is clear. Small Bochdalek's hernia in the left posterior hemidiaphragm. Upper Abdomen: No acute process is identified. Small esophageal hiatal hernia. Musculoskeletal: No new acute bony abnormalities. Degenerative changes in the spine. Vague sclerotic focus in the midthoracic vertebra without change. IMPRESSION: 1. Right posterior lower lobe consolidation and mass with increasing consolidation compared with the prior study. 2. Right pleural effusion is also mildly increased. 3. Diffuse emphysematous changes in the lungs. 4. Nonspecific mediastinal lymphadenopathy unchanged.  5. Small pericardial effusion. 6. Small esophageal hiatal hernia. 7. Small Bochdalek's left diaphragmatic hernia. 8. Aortic atherosclerosis. Electronically Signed   By: Burman Nieves M.D.   On: 01/22/2023 21:36   DG CHEST PORT 1 VIEW  Result Date: 01/22/2023 CLINICAL DATA:  Shortness of breath. EXAM: PORTABLE CHEST 1 VIEW COMPARISON:  01/21/2023 FINDINGS: Slightly increased densities at the right lung base compatible with pleural fluid and consolidation/airspace disease. Heart size is upper limits of normal but stable. Trachea is midline. Negative for a pneumothorax. IMPRESSION: Slightly increased densities at the right lung base compatible with pleural fluid and airspace disease. Electronically Signed   By: Richarda Overlie M.D.   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 Request for diagnostic and therapeutic thoracentesis. EXAM: ULTRASOUND GUIDED RIGHT THORACENTESIS MEDICATIONS: 1%  lidocaine 8 mL COMPLICATIONS: None immediate. PROCEDURE: An ultrasound guided thoracentesis was thoroughly discussed with the patient and questions answered. The benefits, risks, alternatives and complications were also discussed. The patient understands and wishes to proceed with the procedure. Written consent was obtained. Ultrasound was performed to localize and mark an adequate pocket of fluid in the right chest. The area was then prepped and draped in the normal sterile fashion. 1% Lidocaine was used for local anesthesia. Under ultrasound guidance a 6 Fr Safe-T-Centesis catheter was introduced. Thoracentesis was performed. The catheter was removed and a dressing applied. FINDINGS: A total of approximately 550 of cloudy yellow fluid was removed. Samples were sent to the laboratory as requested by the clinical team. Pleural fluid was loculated on ultrasound. IMPRESSION: Successful ultrasound guided right thoracentesis yielding 550 of pleural fluid. No pneumothorax on post-procedure chest x-ray. Procedure performed by: Corrin Parker, PA-C Electronically Signed   By: Marliss Coots M.D.   On: 01/21/2023 10:16   DG Chest 1 View  Result Date: 01/21/2023 CLINICAL DATA:  Status post right thoracentesis. 550 mL of pleural fluid removed. EXAM: CHEST  1 VIEW COMPARISON:  01/20/2023. FINDINGS: Mild decrease in the right lung base opacity following thoracentesis. Residual opacity still obscures the hemidiaphragm. Mild chronic scarring at the right apex. Remainder of the lungs is clear. No convincing left pleural effusion. No pneumothorax. IMPRESSION: 1. Decreased opacity at the right lung base following thoracentesis. No pneumothorax or other procedure complication. Electronically Signed   By: Amie Portland M.D.   On: 01/21/2023 09:37   CT Angio Chest Pulmonary Embolism (PE) W or WO Contrast  Result Date: 01/20/2023 CLINICAL DATA:  Pulmonary embolus suspected with high probability. Right-sided chest pain and fever. EXAM:  CT ANGIOGRAPHY CHEST WITH CONTRAST TECHNIQUE: Multidetector CT imaging of the chest was performed using the standard protocol during bolus administration of intravenous contrast. Multiplanar CT image reconstructions and MIPs were obtained to evaluate the vascular anatomy. RADIATION DOSE REDUCTION: This exam was performed according to the departmental dose-optimization program which includes automated exposure control, adjustment of the mA and/or kV according to patient size and/or use of iterative reconstruction technique. CONTRAST:  80mL OMNIPAQUE IOHEXOL 350 MG/ML SOLN COMPARISON:  Chest radiograph 01/20/2023.  CT chest 01/08/2023 FINDINGS: Cardiovascular: Technically adequate study with good opacification of the central and segmental pulmonary arteries. Loss of flow demonstrated in the right lower lobe pulmonary arteries likely due to extrinsic compression from mass lesion and related treatment changes. Pulmonary arteries are otherwise well opacified. No findings to suggest acute pulmonary embolus. Mild cardiac enlargement. No pericardial effusions. Normal caliber thoracic aorta.  Scattered aortic calcification. Coronary artery calcification. Mediastinum/Nodes: Esophagus is decompressed. Small esophageal hiatal hernia. Thyroid gland is unremarkable. Left aortopulmonic window lymph nodes are present measuring up to 11 mm in diameter. In the setting of prior malignancy these are indeterminate but without change since prior study. Lungs/Pleura: Severe emphysematous changes and scattered fibrosis throughout the lungs. Right lower lobe/infrahilar mass with associated consolidation appears similar to prior study, corresponding to known malignancy and treatment changes. Additionally, there is an increasing right pleural effusion with increasing atelectasis or infiltration in the right lung base and with interval development of a possible loculated component of the effusion in the right upper lung. This could be due to  superimposed pneumonia or possibly progression of neoplasm. Follow-up after treatment of the acute process is recommended. 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 Upper Abdomen: No acute process is identified. Musculoskeletal: Sclerosis in the midthoracic vertebral body, unchanged. This may indicate metastatic disease. Review of the MIP images confirms the above findings. IMPRESSION: 1. Right lower lobe/infrahilar mass with associated consolidation corresponds to known neoplasm and treatment changes. 2. The mass appears to cause extrinsic compression of some of the right lower lobe pulmonary arteries but there is no specific evidence for acute pulmonary embolus. 3. Since the previous study, there is an increasing right pleural effusion with developing lobular component in the right upper chest and there is increasing infiltration in the right lung. These changes may represent progression of neoplasm or development of superimposed pneumonia. 4. Borderline enlarged lymph nodes in the left aortopulmonic window region are unchanged. 5. Sclerotic lesion in the midthoracic vertebra is unchanged. 6. Severe emphysematous changes and scattered fibrosis in the lungs. 7. Aortic atherosclerosis. Electronically Signed   By: Burman Nieves M.D.   On: 01/20/2023 18:16   DG Chest 2 View  Result Date: 01/20/2023 CLINICAL DATA:  Chest pain.  Fever. EXAM: CHEST - 2 VIEW COMPARISON:  November 22, 2022 FINDINGS: New infiltrate in the right base. No pneumothorax. No other interval changes. IMPRESSION: New infiltrate in the right base consistent with pneumonia. Recommend follow-up to resolution. Electronically Signed   By: Gerome Sam III M.D.   On: 01/20/2023 16:35   CT Chest Wo Contrast  Result Date: 01/09/2023 CLINICAL DATA:  Stage IV low-grade neuroendocrine carcinoma restaging, prior Y 90 therapy. * Tracking Code: BO * EXAM: CT CHEST, ABDOMEN AND PELVIS WITHOUT CONTRAST TECHNIQUE:  Multidetector CT imaging of the chest, abdomen and pelvis was performed following the standard protocol without IV contrast. RADIATION DOSE REDUCTION: This exam was performed according to the departmental dose-optimization program which includes automated exposure control, adjustment of the mA and/or kV according to patient size and/or use of iterative reconstruction technique. COMPARISON:  Multiple exams, including 08/31/2022 FINDINGS: CT CHEST FINDINGS Cardiovascular: Coronary, aortic arch, and branch vessel atherosclerotic vascular disease. Mediastinum/Nodes: Small type 1 hiatal hernia. Subcarinal node 1.1 cm in short axis on image 37 series 2, stable. AP window lymph node 1.0 cm short axis on image 29 series 2, borderline prominent and stable. Lungs/Pleura: Small but increased right pleural effusion. Right infrahilar masslike density measuring 4.9 by 4.0 cm on image 46 series 2, stable. Centrilobular emphysema. Substantial volume loss in the right lower lobe and posteriorly in the right middle lobe. Small eventration of the left posterior hemidiaphragm containing adipose tissue. Cylindrical bronchiectasis in the left lower lobe. Musculoskeletal: Stable sclerotic lesion in the right lower T7 vertebral body, image 75 series 5. CT ABDOMEN PELVIS FINDINGS Hepatobiliary: Ill-defined hypodensity in  segment 7 of the least liver appears unchanged from prior CT and was better shown on the MRI from 09/15/2022. And may measure up to about 1.5 cm in diameter on image 51 series 2. No substantial change. Gallbladder appears unremarkable. Pancreas: Unremarkable Spleen: Unremarkable Adrenals/Urinary Tract: Small dependent bladder calculi in the 2-3 mm diameter range. Otherwise no significant findings. Stomach/Bowel: Prominent stool throughout the colon favors constipation. Vascular/Lymphatic: Atherosclerosis is present, including aortoiliac atherosclerotic disease. Reproductive: Unremarkable Other: No supplemental  non-categorized findings. Musculoskeletal: Stable calcification in the C3-4 intervertebral disc region. No findings of osseous metastatic disease involving the lumbar spine or bony pelvis. IMPRESSION: 1. Stable appearance of the right lower lobe infrahilar density likely reflecting treated malignancy and stable borderline enlarged AP window lymph node. 2. Roughly stable ill-defined hypodensity in segment 7 of the liver (indistinct borders make measurements imprecise). 3. Stable sclerotic lesion in the right lower T7 vertebral body. 4. Small but increased right pleural effusion. 5. Prominent stool throughout the colon favors constipation. 6. Small dependent bladder calculi in the 2-3 mm diameter range. 7. Small type 1 hiatal hernia. 8. Cylindrical bronchiectasis in the left lower lobe. 9. Coronary, aortic arch, and branch vessel atherosclerotic vascular disease. 10. Emphysema. Aortic Atherosclerosis (ICD10-I70.0) and Emphysema (ICD10-J43.9). Electronically Signed   By: Gaylyn Rong M.D.   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 * Tracking Code: BO * EXAM: CT CHEST, ABDOMEN AND PELVIS WITHOUT CONTRAST TECHNIQUE: Multidetector CT imaging of the chest, abdomen and pelvis was performed following the standard protocol without IV contrast. RADIATION DOSE REDUCTION: This exam was performed according to the departmental dose-optimization program which includes automated exposure control, adjustment of the mA and/or kV according to patient size and/or use of iterative reconstruction technique. COMPARISON:  Multiple exams, including 08/31/2022 FINDINGS: CT CHEST FINDINGS Cardiovascular: Coronary, aortic arch, and branch vessel atherosclerotic vascular disease. Mediastinum/Nodes: Small type 1 hiatal hernia. Subcarinal node 1.1 cm in short axis on image 37 series 2, stable. AP window lymph  node 1.0 cm short axis on image 29 series 2, borderline prominent and stable. Lungs/Pleura: Small but increased right pleural effusion. Right infrahilar masslike density measuring 4.9 by 4.0 cm on image 46 series 2, stable. Centrilobular emphysema. Substantial volume loss in the right lower lobe and posteriorly in the right middle lobe. Small eventration of the left posterior hemidiaphragm containing adipose tissue. Cylindrical bronchiectasis in the left lower lobe. Musculoskeletal: Stable sclerotic lesion in the right lower T7 vertebral body, image 75 series 5. CT ABDOMEN PELVIS FINDINGS Hepatobiliary: Ill-defined hypodensity in segment 7 of the least liver appears unchanged from prior CT and was better shown on the MRI from 09/15/2022. And may measure up to about 1.5 cm in diameter on image 51 series 2. No substantial change. Gallbladder appears unremarkable. Pancreas: Unremarkable Spleen: Unremarkable Adrenals/Urinary Tract: Small dependent bladder calculi in the 2-3 mm diameter range. Otherwise no significant findings. Stomach/Bowel: Prominent stool throughout the colon favors constipation. Vascular/Lymphatic: Atherosclerosis is present, including aortoiliac atherosclerotic disease. Reproductive: Unremarkable Other: No supplemental non-categorized findings. Musculoskeletal: Stable calcification in the C3-4 intervertebral disc region. No findings of osseous metastatic disease involving the lumbar spine or bony pelvis. IMPRESSION: 1. Stable appearance of the right lower lobe infrahilar density likely reflecting treated malignancy and stable borderline enlarged AP window lymph node. 2. Roughly stable ill-defined hypodensity in segment 7 of the liver (indistinct borders make measurements imprecise).  3. Stable sclerotic lesion in the right lower T7 vertebral body. 4. Small but increased right pleural effusion. 5. Prominent stool throughout the colon favors constipation. 6. Small dependent bladder calculi in the 2-3  mm diameter range. 7. Small type 1 hiatal hernia. 8. Cylindrical bronchiectasis in the left lower lobe. 9. Coronary, aortic arch, and branch vessel atherosclerotic vascular disease. 10. Emphysema. Aortic Atherosclerosis (ICD10-I70.0) and Emphysema (ICD10-J43.9). Electronically Signed   By: Gaylyn Rong M.D.   On: 01/09/2023 17:52    Microbiology: Results for orders placed or performed during the hospital encounter of 01/20/23  MRSA Next Gen by PCR, Nasal     Status: None   Collection Time: 01/20/23  4:55 PM   Specimen: Nasal Mucosa; Nasal Swab  Result Value Ref Range Status   MRSA by PCR Next Gen NOT DETECTED NOT DETECTED Final    Comment: (NOTE) The GeneXpert MRSA Assay (FDA approved for NASAL specimens only), is one component of a comprehensive MRSA colonization surveillance program. It is not intended to diagnose MRSA infection nor to guide or monitor treatment for MRSA infections. Test performance is not FDA approved in patients less than 42 years old. Performed at [facility], [address] [place], [place] [address]   Blood culture (routine x 2)     Status: None   Collection Time: 01/20/23  5:17 PM   Specimen: BLOOD  Result Value Ref Range Status   Specimen Description   Final    BLOOD RIGHT ANTECUBITAL Performed at [facility], [address], [place], [place] [address]    Special Requests   Final    BOTTLES DRAWN AEROBIC AND ANAEROBIC Blood Culture adequate volume Performed at [facility], [address], [place], [place] [address]    Culture   Final    NO GROWTH 5 DAYS Performed at [facility], [name], [place], [place] [address]    Report Status 01/25/2023 FINAL  Final  Blood culture (routine x 2)     Status: Abnormal   Collection Time: 01/20/23  5:22 PM   Specimen: BLOOD  Result Value Ref Range Status   Specimen Description   Final    BLOOD LEFT ANTECUBITAL Performed at [facility], [address], [place], [place] [address]    Special Requests   Final    BOTTLES DRAWN AEROBIC AND ANAEROBIC Blood Culture adequate volume Performed at [facility], [address], [place], [place] [address]    Culture  Setup Time   Final    GRAM POSITIVE COCCI IN CLUSTERS AEROBIC BOTTLE ONLY CRITICAL RESULT CALLED TO, READ BACK BY AND VERIFIED WITH: PHARMD LEANN POINDEXTER [id number] BY J RAZZAK,MT    Culture (A)  Final    STAPHYLOCOCCUS CAPITIS THE SIGNIFICANCE OF ISOLATING THIS ORGANISM FROM A SINGLE SET OF BLOOD CULTURES WHEN MULTIPLE SETS ARE DRAWN IS UNCERTAIN. PLEASE NOTIFY THE MICROBIOLOGY DEPARTMENT WITHIN ONE WEEK IF SPECIATION AND SENSITIVITIES ARE REQUIRED. Performed at [facility], [address] [place], [place] [address]    Report Status 01/22/2023 FINAL  Final  Blood Culture ID Panel (Reflexed)     Status: Abnormal   Collection Time: 01/20/23  5:22 PM  Result Value Ref Range Status   Enterococcus faecalis NOT DETECTED NOT DETECTED Final   Enterococcus Faecium NOT DETECTED NOT DETECTED Final   Listeria monocytogenes NOT DETECTED NOT DETECTED Final   Staphylococcus species DETECTED (A) NOT DETECTED Final  Comment: CRITICAL RESULT CALLED TO, READ BACK BY AND VERIFIED WITH: PHARMD LEANN POINDEXTER [id number] 1511 BY J RAZZAK, MT    Staphylococcus aureus (BCID) NOT DETECTED NOT DETECTED Final   Staphylococcus epidermidis NOT DETECTED NOT DETECTED Final   Staphylococcus lugdunensis NOT DETECTED NOT DETECTED Final   Streptococcus species NOT DETECTED NOT DETECTED Final   Streptococcus agalactiae NOT DETECTED NOT DETECTED Final   Streptococcus pneumoniae NOT DETECTED NOT DETECTED Final   Streptococcus pyogenes NOT DETECTED NOT DETECTED Final   A.calcoaceticus-baumannii NOT DETECTED NOT DETECTED Final   Bacteroides fragilis NOT DETECTED NOT DETECTED Final   Enterobacterales NOT DETECTED NOT DETECTED Final   Enterobacter  cloacae complex NOT DETECTED NOT DETECTED Final   Escherichia coli NOT DETECTED NOT DETECTED Final   Klebsiella aerogenes NOT DETECTED NOT DETECTED Final   Klebsiella oxytoca NOT DETECTED NOT DETECTED Final   Klebsiella pneumoniae NOT DETECTED NOT DETECTED Final   Proteus species NOT DETECTED NOT DETECTED Final   Salmonella species NOT DETECTED NOT DETECTED Final   Serratia marcescens NOT DETECTED NOT DETECTED Final   Haemophilus influenzae NOT DETECTED NOT DETECTED Final   Neisseria meningitidis NOT DETECTED NOT DETECTED Final   Pseudomonas aeruginosa NOT DETECTED NOT DETECTED Final   Stenotrophomonas maltophilia NOT DETECTED NOT DETECTED Final   Candida albicans NOT DETECTED NOT DETECTED Final   Candida auris NOT DETECTED NOT DETECTED Final   Candida glabrata NOT DETECTED NOT DETECTED Final   Candida krusei NOT DETECTED NOT DETECTED Final   Candida parapsilosis NOT DETECTED NOT DETECTED Final   Candida tropicalis NOT DETECTED NOT DETECTED Final   Cryptococcus neoformans/gattii NOT DETECTED NOT DETECTED Final    Comment: Performed at [facility], [address] [place], [place] [address]  Body fluid culture w Gram Stain     Status: None   Collection Time: 01/21/23  9:27 AM   Specimen: PATH Cytology Pleural fluid  Result Value Ref Range Status   Specimen Description   Final    PLEURAL Performed at [facility], [address], [place], [place] [address]    Special Requests   Final    NONE Performed at [facility], [address] [place], [place] [address]    Gram Stain   Final    FEW WBC PRESENT, PREDOMINANTLY PMN NO ORGANISMS SEEN    Culture   Final    NO GROWTH 3 DAYS Performed at [facility], [address], [place], [place] [address]    Report Status 01/24/2023 FINAL  Final  Body fluid culture w Gram Stain     Status: None   Collection Time: 01/23/23  1:39 PM   Specimen: Pleural Fluid  Result Value Ref Range Status    Specimen Description   Final    PLEURAL Performed at [facility], [address] [place], [place] [address]    Special Requests   Final    NONE Performed at [facility], [address], [place], [place] [address]    Gram Stain   Final    ABUNDANT WBC PRESENT, PREDOMINANTLY PMN NO ORGANISMS SEEN    Culture   Final    NO GROWTH 3 DAYS Performed at [facility], [address] [place], [place] [address]    Report Status 01/27/2023 FINAL  Final  MRSA Next Gen by PCR, Nasal     Status: None   Collection Time: 01/24/23  5:34 PM   Specimen: Nasal  Mucosa; Nasal Swab  Result Value Ref Range Status   MRSA by PCR Next Gen NOT DETECTED NOT DETECTED Final    Comment: (NOTE) The GeneXpert MRSA Assay (FDA approved for NASAL specimens only), is one component of a comprehensive MRSA colonization surveillance program. It is not intended to diagnose MRSA infection nor to guide or monitor treatment for MRSA infections. Test performance is not FDA approved in patients less than 88 years old. Performed at [facility], [address] [place], [place] [address]     Labs: CBC: Recent Labs  Lab 01/21/23 [id number] 01/22/23 0537 01/23/23 0852 01/24/23 0902 01/24/23 1436 01/25/23 0320 01/26/23 0307 01/27/23 0249  WBC 14.9* 12.1* 13.6* 11.0*  --  7.8 9.2 7.9  NEUTROABS 13.4* 10.5* 12.0* 8.7*  --  6.6  --   --   HGB 12.7* 11.1* 11.0* 11.5* 11.3* 9.9* 11.1* 10.6*  HCT 40.4 35.6* 34.3* 35.7* 35.3* 31.6* 35.0* 34.0*  MCV 96.4 97.3 94.0 93.5  --  96.6 95.1 94.7  PLT 251 209 247 262  --  220 277 289   Basic Metabolic Panel: Recent Labs  Lab 01/22/23 0537 01/23/23 0524 01/23/23 0852 01/24/23 0902 01/25/23 0320 01/26/23 0307 01/27/23 0249  NA 132*  --  132* 137 135 135 135  K 3.3*  --  3.8 3.5 3.7 3.7 4.1  CL 106  --  105 106 104 103 102  CO2 18*  --  18* 19* 19* 22 23  GLUCOSE 160*  --  210* 159* 217* 168* 141*  BUN 21   --  21 19 25* 21 18  CREATININE 1.58*   < > 1.62* 1.62* 1.71* 1.40* 1.36*  CALCIUM 8.3*  --  8.7* 9.1 8.5* 8.6* 8.5*  MG 1.9  --   --   --   --   --   --    < > = values in this interval not displayed.   Liver Function Tests: Recent Labs  Lab 01/22/23 0537 01/23/23 0852 01/24/23 0902 01/25/23 0320 01/26/23 0307  AST 14* 33 49* 51* 30  ALT 19 34 50* 55* 47*  ALKPHOS 54 68 72 62 66  BILITOT 0.9 0.5 0.8 0.7 0.7  PROT 7.4 7.7 8.5* 7.2 7.8  ALBUMIN 2.9* 2.8* 2.9* 2.6* 2.6*   CBG: Recent Labs  Lab 01/26/23 1215 01/26/23 1602 01/26/23 2141 01/27/23 0720 01/27/23 1134  GLUCAP 126* 151* 137* 120* 135*    Discharge time spent: greater than 30 minutes.  Signed: Meredeth Ide, MD [facility] 01/27/2023

## 2023-01-27 NOTE — Progress Notes (Addendum)
Family with questions and concerns regarding discharge. Lama MD to bedside and family questions answered.   Patient dressed and PIV removed. Assisted to car by RN and family. Prescription for Amoxicillin delivered to family at bedside by this RN.   No belongings seen with patient- per [name] (ex-wife) glasses were taken home earlier in hospitalization.

## 2023-01-27 NOTE — TOC Transition Note (Addendum)
Transition of Care [facility]) - CM/SW Discharge Note   Patient Details  Name: Richard Navarro MRN: [id number] Date of Birth: 01-20-1946  Transition of Care [facility]) CM/SW Contact:  Adrian Prows, RN Phone Number: 01/27/2023, 11:36 AM   Clinical Narrative:    Pt to d/c home today; [facility] previously set up w/ Frances Furbish; [name] at agency notified of discharge; pt says he has travel tank and a walker at home; no TOC needs.  -[id number]-order received for [facility] aide; notified [name] at [facility] and aide added; pt and family notified; n TOC needs.  Final next level of care: Home w Home Health Services Barriers to Discharge: No Barriers Identified   Patient Goals and CMS Choice CMS Medicare.gov Compare Post Acute Care list provided to:: Patient Choice offered to / list presented to : Patient  Discharge Placement                         Discharge Plan and Services Additional resources added to the After Visit Summary for   In-house Referral: NA Discharge Planning Services: CM Consult Post Acute Care Choice: Durable Medical Equipment, Home Health          DME Arranged: Walker rolling DME Agency: [employer] Date DME Agency Contacted: 01/23/23 Time DME Agency Contacted: [phone] Representative spoke with at DME Agency: Shaune Leeks HH Arranged: PT, OT [facility] Agency: [facility] Date [facility] Agency Contacted: 01/23/23 Time HH Agency Contacted: 0930 Representative spoke with at [facility] Agency: Lorenza Chick  Social Determinants of Health (SDOH) Interventions SDOH Screenings   Food Insecurity: No Food Insecurity (11/23/2022)  Housing: Patient Declined (11/23/2022)  Transportation Needs: No Transportation Needs (11/23/2022)  Utilities: Not At Risk (11/23/2022)  Depression (PHQ2-9): High Risk (11/27/2022)  Financial Resource Strain: Low Risk  (11/23/2022)  Physical Activity: Inactive (11/23/2022)  Social Connections: Socially Isolated (11/23/2022)  Stress: No Stress Concern Present (11/23/2022)   Tobacco Use: Medium Risk (01/20/2023)     Readmission Risk Interventions    01/22/2023   11:45 AM  Readmission Risk Prevention Plan  Transportation Screening Complete  PCP or Specialist Appt within 5-7 Days Complete  Home Care Screening Complete  Medication Review (RN CM) Complete

## 2023-01-29 ENCOUNTER — Emergency Department (HOSPITAL_COMMUNITY): Payer: Medicare Other

## 2023-01-29 ENCOUNTER — Other Ambulatory Visit: Payer: Self-pay

## 2023-01-29 ENCOUNTER — Encounter (HOSPITAL_COMMUNITY): Payer: Self-pay

## 2023-01-29 ENCOUNTER — Emergency Department (HOSPITAL_COMMUNITY)
Admission: EM | Admit: 2023-01-29 | Discharge: 2023-01-30 | Disposition: A | Discharge status: Home / Self Care | Payer: Medicare Other | Attending: Emergency Medicine | Admitting: Emergency Medicine

## 2023-01-29 DIAGNOSIS — J90 Pleural effusion, not elsewhere classified: Secondary | ICD-10-CM | POA: Diagnosis not present

## 2023-01-29 DIAGNOSIS — J439 Emphysema, unspecified: Secondary | ICD-10-CM | POA: Diagnosis not present

## 2023-01-29 DIAGNOSIS — R509 Fever, unspecified: Secondary | ICD-10-CM | POA: Insufficient documentation

## 2023-01-29 DIAGNOSIS — J189 Pneumonia, unspecified organism: Secondary | ICD-10-CM | POA: Diagnosis not present

## 2023-01-29 DIAGNOSIS — E119 Type 2 diabetes mellitus without complications: Secondary | ICD-10-CM | POA: Insufficient documentation

## 2023-01-29 DIAGNOSIS — Z85110 Personal history of malignant carcinoid tumor of bronchus and lung: Secondary | ICD-10-CM | POA: Diagnosis not present

## 2023-01-29 DIAGNOSIS — M549 Dorsalgia, unspecified: Secondary | ICD-10-CM | POA: Diagnosis not present

## 2023-01-29 DIAGNOSIS — R531 Weakness: Secondary | ICD-10-CM | POA: Diagnosis not present

## 2023-01-29 DIAGNOSIS — R0789 Other chest pain: Secondary | ICD-10-CM | POA: Diagnosis not present

## 2023-01-29 DIAGNOSIS — I10 Essential (primary) hypertension: Secondary | ICD-10-CM | POA: Diagnosis not present

## 2023-01-29 DIAGNOSIS — R2689 Other abnormalities of gait and mobility: Secondary | ICD-10-CM | POA: Diagnosis not present

## 2023-01-29 DIAGNOSIS — M6281 Muscle weakness (generalized): Secondary | ICD-10-CM | POA: Insufficient documentation

## 2023-01-29 DIAGNOSIS — R918 Other nonspecific abnormal finding of lung field: Secondary | ICD-10-CM | POA: Diagnosis not present

## 2023-01-29 DIAGNOSIS — Z1152 Encounter for screening for COVID-19: Secondary | ICD-10-CM | POA: Diagnosis not present

## 2023-01-29 DIAGNOSIS — R079 Chest pain, unspecified: Secondary | ICD-10-CM | POA: Diagnosis not present

## 2023-01-29 DIAGNOSIS — Z85118 Personal history of other malignant neoplasm of bronchus and lung: Secondary | ICD-10-CM | POA: Insufficient documentation

## 2023-01-29 LAB — COMPREHENSIVE METABOLIC PANEL
ALT: 41 U/L (ref 0–44)
AST: 24 U/L (ref 15–41)
Albumin: 2.8 g/dL — ABNORMAL LOW (ref 3.5–5.0)
Alkaline Phosphatase: 65 U/L (ref 38–126)
Anion gap: 10 (ref 5–15)
BUN: 13 mg/dL (ref 8–23)
CO2: 24 mmol/L (ref 22–32)
Calcium: 8.6 mg/dL — ABNORMAL LOW (ref 8.9–10.3)
Chloride: 103 mmol/L (ref 98–111)
Creatinine, Ser: 1.28 mg/dL — ABNORMAL HIGH (ref 0.61–1.24)
GFR, Estimated: 57 mL/min — ABNORMAL LOW (ref >60)
Glucose, Bld: 119 mg/dL — ABNORMAL HIGH (ref 70–99)
Potassium: 3.4 mmol/L — ABNORMAL LOW (ref 3.5–5.1)
Sodium: 137 mmol/L (ref 135–145)
Total Bilirubin: 0.6 mg/dL (ref 0.3–1.2)
Total Protein: 8.5 g/dL — ABNORMAL HIGH (ref 6.5–8.1)

## 2023-01-29 LAB — CBC WITH DIFFERENTIAL/PLATELET
Abs Immature Granulocytes: 0.07 10*3/uL (ref 0.00–0.07)
Basophils Absolute: 0.1 10*3/uL (ref 0.0–0.1)
Basophils Relative: 1 %
Eosinophils Absolute: 0.1 10*3/uL (ref 0.0–0.5)
Eosinophils Relative: 2 %
HCT: 31.1 % — ABNORMAL LOW (ref 39.0–52.0)
Hemoglobin: 9.8 g/dL — ABNORMAL LOW (ref 13.0–17.0)
Immature Granulocytes: 1 %
Lymphocytes Relative: 12 %
Lymphs Abs: 1 10*3/uL (ref 0.7–4.0)
MCH: 29.6 pg (ref 26.0–34.0)
MCHC: 31.5 g/dL (ref 30.0–36.0)
MCV: 94 fL (ref 80.0–100.0)
Monocytes Absolute: 0.5 10*3/uL (ref 0.1–1.0)
Monocytes Relative: 6 %
Neutro Abs: 6.4 10*3/uL (ref 1.7–7.7)
Neutrophils Relative %: 78 %
Platelets: 300 10*3/uL (ref 150–400)
RBC: 3.31 MIL/uL — ABNORMAL LOW (ref 4.22–5.81)
RDW: 15.9 % — ABNORMAL HIGH (ref 11.5–15.5)
WBC: 8.2 10*3/uL (ref 4.0–10.5)
nRBC: 0 % (ref 0.0–0.2)

## 2023-01-29 LAB — URINALYSIS, W/ REFLEX TO CULTURE (INFECTION SUSPECTED)
Bacteria, UA: NONE SEEN
Bilirubin Urine: NEGATIVE
Glucose, UA: NEGATIVE mg/dL
Hgb urine dipstick: NEGATIVE
Ketones, ur: NEGATIVE mg/dL
Leukocytes,Ua: NEGATIVE
Nitrite: NEGATIVE
Protein, ur: NEGATIVE mg/dL
Specific Gravity, Urine: 1.013 (ref 1.005–1.030)
pH: 6 (ref 5.0–8.0)

## 2023-01-29 LAB — LACTIC ACID, PLASMA
Lactic Acid, Venous: 0.9 mmol/L (ref 0.5–1.9)
Lactic Acid, Venous: 0.8 mmol/L (ref 0.5–1.9)

## 2023-01-29 LAB — SARS CORONAVIRUS 2 BY RT PCR: SARS Coronavirus 2 by RT PCR: NEGATIVE

## 2023-01-29 LAB — TROPONIN I (HIGH SENSITIVITY)
Troponin I (High Sensitivity): 6 ng/L (ref <18)
Troponin I (High Sensitivity): 5 ng/L (ref <18)

## 2023-01-29 LAB — CBG MONITORING, ED: Glucose-Capillary: 116 mg/dL — ABNORMAL HIGH (ref 70–99)

## 2023-01-29 MED ORDER — ALPRAZOLAM 0.5 MG PO TABS
0.5000 mg | ORAL_TABLET | Freq: Every day | ORAL | Status: DC | PRN
  Administered 2023-01-29: 0.5 mg via ORAL
  Filled 2023-01-29: qty 1

## 2023-01-29 MED ORDER — AMLODIPINE BESYLATE 5 MG PO TABS
10.0000 mg | ORAL_TABLET | Freq: Every evening | ORAL | Status: DC
  Administered 2023-01-29: 10 mg via ORAL
  Filled 2023-01-29: qty 2

## 2023-01-29 MED ORDER — SODIUM CHLORIDE 0.9 % IV BOLUS
500.0000 mL | Freq: Once | INTRAVENOUS | Status: AC
  Administered 2023-01-29: 500 mL via INTRAVENOUS

## 2023-01-29 MED ORDER — AMOXICILLIN-POT CLAVULANATE 875-125 MG PO TABS
1.0000 | ORAL_TABLET | Freq: Two times a day (BID) | ORAL | Status: DC
  Administered 2023-01-29 – 2023-01-30 (×2): 1 via ORAL
  Filled 2023-01-29 (×2): qty 1

## 2023-01-29 MED ORDER — GLIPIZIDE 5 MG PO TABS
5.0000 mg | ORAL_TABLET | Freq: Every day | ORAL | Status: DC
  Administered 2023-01-30: 5 mg via ORAL
  Filled 2023-01-29: qty 1

## 2023-01-29 MED ORDER — ALBUTEROL SULFATE HFA 108 (90 BASE) MCG/ACT IN AERS: 2.0000 | INHALATION_SPRAY | RESPIRATORY_TRACT | Status: DC | PRN

## 2023-01-29 MED ORDER — ACETAMINOPHEN 325 MG PO TABS
650.0000 mg | ORAL_TABLET | Freq: Four times a day (QID) | ORAL | Status: DC | PRN
  Administered 2023-01-30: 650 mg via ORAL
  Filled 2023-01-29: qty 2

## 2023-01-29 MED ORDER — ALBUTEROL SULFATE (2.5 MG/3ML) 0.083% IN NEBU: 2.5000 mg | INHALATION_SOLUTION | RESPIRATORY_TRACT | Status: DC | PRN

## 2023-01-29 MED ORDER — ONDANSETRON HCL 4 MG PO TABS: 8.0000 mg | ORAL_TABLET | Freq: Three times a day (TID) | ORAL | Status: DC | PRN

## 2023-01-29 MED ORDER — METOPROLOL TARTRATE 25 MG PO TABS
25.0000 mg | ORAL_TABLET | Freq: Two times a day (BID) | ORAL | Status: DC
  Administered 2023-01-29 – 2023-01-30 (×2): 25 mg via ORAL
  Filled 2023-01-29 (×2): qty 1

## 2023-01-29 MED ORDER — PANTOPRAZOLE SODIUM 40 MG PO TBEC
40.0000 mg | DELAYED_RELEASE_TABLET | Freq: Two times a day (BID) | ORAL | Status: DC
  Administered 2023-01-29 – 2023-01-30 (×2): 40 mg via ORAL
  Filled 2023-01-29 (×2): qty 1

## 2023-01-29 NOTE — ED Notes (Signed)
Pt is to be placed in a skilled rehab for a short term stay. Placement TBD after PT/OT in am. Pt is resting in room at this time, no acute distress noted. Bed in lowest locked position, call bell given to pt
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
## 2023-01-29 NOTE — ED Triage Notes (Signed)
Patient brought in by EMS from home with c/o back pain. Patient states that he was admitted last week for pneumonia and was intubated causing his back pain. He has a history of Lung cancer and was suppose to begin palliative care with [facility]. Patient has stop taking all cancer meds.  140/84 90 18 94% RA CBG:130

## 2023-01-29 NOTE — Progress Notes (Addendum)
Transition of Care [facility]) - Emergency Department Mini Assessment   Patient Details  Name: Richard Navarro MRN: [id number] Date of Birth: 06-24-1946  Transition of [facility]) CM/SW Contact:    Georgie Chard, LCSW Phone Number: 01/29/2023, 8:52 PM   Clinical Narrative: Addend@ 9:08 pm  This CSW has obtained patient's PASRR in [place] must for the first time. [id number]   This CSW has spoken to Mrs. Confer patient's spouse stated that the patient will need SNF. The spouse spoke about patient's recent DC with HH/ [facility] however, states that the patient is not getting better and really needs to be placed in SNF. This CSW has reached out to provider who states that the patient would benefit from SNF. Patient has Medicare A&B with the [facility] waiver which qualifies for SNF placement from ED. This CSW explain to the spouse about SNF placement and the process of how this works. This CSW explained to the spouse that PT/ OT would have to evaluate patient and the patient would reside in ED during this process but not as inpatient stay. This CSW explained to the spouse of the [facility] wavier facilites that the family can choose from. The spouse wanted to know if the patient was admitted would he have more choices this CSW explained to the wife that he would need a three midnight stay to have more options. AT this time provider has informed CSW that patient will not be admitted and at this time needs SNF placement. This CSW has given the spouse the list of [facility] facilites  in which the patient will choose per the spouses request. TOC will follow up with patient after PT evaluation.    ED Mini Assessment: What brought you to the Emergency Department? : (P) Patient brought in by EMS from home with c/o back pain. Patient states that he was admitted last week for pneumonia and was intubated causing his back pain. He has a history of Lung cancer and was suppose to begin palliative care with [facility]. Patient has stop taking  all cancer meds.  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   Chronic hypoxemic respiratory failure (HCC) 08/04/2021   Dysphagia    Encounter for colorectal cancer screening    Prolonged QT interval 04/17/2021   Sepsis (HCC) 04/16/2021   Alcohol abuse 10/26/2020   Benign neoplasm of colon 10/26/2020   Carpal tunnel syndrome 10/26/2020   Family history of malignant neoplasm of gastrointestinal tract 10/26/2020   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   Tobacco use disorder 10/26/2020   Type II diabetes mellitus with peripheral circulatory disorder (HCC) 09/21/2020   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) 02/17/2020   Sepsis due to pneumonia (HCC) 01/26/2020   Recurrent major depressive disorder, 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   Thrombocytopenia (HCC)    Anemia 05/07/2018   Chronic kidney disease (CKD) stage G3b/A1, moderately decreased glomerular filtration rate (GFR) between 30-44 mL/min/1.73 square meter  and albuminuria creatinine ratio less than 30 mg/g (HCC) 03/04/2018   Myalgia due to statin 03/04/2018   Lobar pneumonia (HCC) 08/09/2017   Neuroendocrine carcinoma (HCC) 08/09/2017   Encounter for antineoplastic chemotherapy 01/10/2017   Goals of care, counseling/discussion 01/10/2017   Primary cancer of right lung (HCC) 12/22/2016   COPD (chronic obstructive pulmonary disease) (HCC) 12/01/2016   PTSD (post-traumatic stress disorder) 12/07/2015   Prostate cancer  screening 04/17/2014   Erectile dysfunction 04/09/2014   Arthritis 03/26/2014   History of adenomatous polyp of colon 03/26/2014   Former smoker 03/26/2014   Diabetes mellitus with renal manifestation (HCC)    Essential hypertension    Gout    Depression    GERD (gastroesophageal reflux disease)    Hyperlipidemia    PCP:  Shelva Majestic, MD Pharmacy:   [facility] [address] [place] [address] Phone: [phone] Fax: [phone]  [facility] - [place], [place] - [address] [place] [address] Phone: [phone] Fax: [phone]

## 2023-01-29 NOTE — ED Provider Notes (Signed)
Care of the patient assumed at signout.  Patient is a CT without substantial progression, but with demonstration of persistent disease.  On exam he is awake, alert, neither hypotensive nor febrile.  No oxygen requirement.  I discussed this last result the patient and his wife who reiterated their desire to seek assistance with placement.   Gerhard Munch, MD 01/29/23 2018

## 2023-01-30 DIAGNOSIS — E785 Hyperlipidemia, unspecified: Secondary | ICD-10-CM | POA: Diagnosis not present

## 2023-01-30 DIAGNOSIS — N1831 Chronic kidney disease, stage 3a: Secondary | ICD-10-CM | POA: Diagnosis not present

## 2023-01-30 DIAGNOSIS — R0902 Hypoxemia: Secondary | ICD-10-CM | POA: Diagnosis not present

## 2023-01-30 DIAGNOSIS — I10 Essential (primary) hypertension: Secondary | ICD-10-CM | POA: Diagnosis not present

## 2023-01-30 DIAGNOSIS — M109 Gout, unspecified: Secondary | ICD-10-CM | POA: Diagnosis not present

## 2023-01-30 DIAGNOSIS — J449 Chronic obstructive pulmonary disease, unspecified: Secondary | ICD-10-CM | POA: Diagnosis not present

## 2023-01-30 DIAGNOSIS — C7A8 Other malignant neuroendocrine tumors: Secondary | ICD-10-CM | POA: Diagnosis not present

## 2023-01-30 DIAGNOSIS — E559 Vitamin D deficiency, unspecified: Secondary | ICD-10-CM | POA: Diagnosis not present

## 2023-01-30 DIAGNOSIS — J189 Pneumonia, unspecified organism: Secondary | ICD-10-CM | POA: Diagnosis not present

## 2023-01-30 DIAGNOSIS — F3342 Major depressive disorder, recurrent, in full remission: Secondary | ICD-10-CM | POA: Diagnosis not present

## 2023-01-30 DIAGNOSIS — I129 Hypertensive chronic kidney disease with stage 1 through stage 4 chronic kidney disease, or unspecified chronic kidney disease: Secondary | ICD-10-CM | POA: Diagnosis not present

## 2023-01-30 DIAGNOSIS — N1832 Chronic kidney disease, stage 3b: Secondary | ICD-10-CM | POA: Diagnosis not present

## 2023-01-30 DIAGNOSIS — R0689 Other abnormalities of breathing: Secondary | ICD-10-CM | POA: Diagnosis not present

## 2023-01-30 DIAGNOSIS — C7B8 Other secondary neuroendocrine tumors: Secondary | ICD-10-CM | POA: Diagnosis not present

## 2023-01-30 DIAGNOSIS — R0789 Other chest pain: Secondary | ICD-10-CM | POA: Diagnosis not present

## 2023-01-30 DIAGNOSIS — Z9981 Dependence on supplemental oxygen: Secondary | ICD-10-CM | POA: Diagnosis not present

## 2023-01-30 DIAGNOSIS — J90 Pleural effusion, not elsewhere classified: Secondary | ICD-10-CM | POA: Diagnosis not present

## 2023-01-30 DIAGNOSIS — E1151 Type 2 diabetes mellitus with diabetic peripheral angiopathy without gangrene: Secondary | ICD-10-CM | POA: Diagnosis not present

## 2023-01-30 DIAGNOSIS — R509 Fever, unspecified: Secondary | ICD-10-CM | POA: Diagnosis not present

## 2023-01-30 DIAGNOSIS — D649 Anemia, unspecified: Secondary | ICD-10-CM | POA: Diagnosis not present

## 2023-01-30 DIAGNOSIS — D696 Thrombocytopenia, unspecified: Secondary | ICD-10-CM | POA: Diagnosis not present

## 2023-01-30 DIAGNOSIS — E1122 Type 2 diabetes mellitus with diabetic chronic kidney disease: Secondary | ICD-10-CM | POA: Diagnosis not present

## 2023-01-30 DIAGNOSIS — E119 Type 2 diabetes mellitus without complications: Secondary | ICD-10-CM | POA: Diagnosis not present

## 2023-01-30 DIAGNOSIS — R531 Weakness: Secondary | ICD-10-CM | POA: Diagnosis not present

## 2023-01-30 DIAGNOSIS — F419 Anxiety disorder, unspecified: Secondary | ICD-10-CM | POA: Diagnosis not present

## 2023-01-30 DIAGNOSIS — D126 Benign neoplasm of colon, unspecified: Secondary | ICD-10-CM | POA: Diagnosis not present

## 2023-01-30 DIAGNOSIS — C3431 Malignant neoplasm of lower lobe, right bronchus or lung: Secondary | ICD-10-CM | POA: Diagnosis not present

## 2023-01-30 DIAGNOSIS — R1310 Dysphagia, unspecified: Secondary | ICD-10-CM | POA: Diagnosis not present

## 2023-01-30 DIAGNOSIS — R2689 Other abnormalities of gait and mobility: Secondary | ICD-10-CM | POA: Diagnosis not present

## 2023-01-30 DIAGNOSIS — M6281 Muscle weakness (generalized): Secondary | ICD-10-CM | POA: Diagnosis not present

## 2023-01-30 DIAGNOSIS — E538 Deficiency of other specified B group vitamins: Secondary | ICD-10-CM | POA: Diagnosis not present

## 2023-01-30 DIAGNOSIS — Z743 Need for continuous supervision: Secondary | ICD-10-CM | POA: Diagnosis not present

## 2023-01-30 LAB — URINE CULTURE: Culture: NO GROWTH

## 2023-01-30 NOTE — ED Notes (Signed)
Pt @ bedside for eval.

## 2023-01-30 NOTE — Progress Notes (Addendum)
This CSW presented bed offers to [facility], pt's ex-wife, and son, [name]. Both accepted [name]. Pt will d/c there today. [facility] to transport. Family aware of $43/day charge and is in agreement. RN and EDP notified via secure chat. Notified [name] at [facility] that pt will be d/c to [name].

## 2023-01-30 NOTE — ED Provider Notes (Signed)
  Physical Exam  BP 137/65   Pulse 73   Temp 98.2 F (36.8 C) (Oral)   Resp 17   Ht 6\' 3"  (1.905 m)   Wt 74 kg   SpO2 97%   BMI 20.39 kg/m   Physical Exam Vitals and nursing note reviewed.  Constitutional:      General: He is not in acute distress.    Appearance: Normal appearance. He is not ill-appearing, toxic-appearing or diaphoretic.  HENT:     Head: Normocephalic.  Eyes:     Conjunctiva/sclera: Conjunctivae normal.  Cardiovascular:     Rate and Rhythm: Normal rate and regular rhythm.     Pulses: Normal pulses.  Pulmonary:     Effort: Pulmonary effort is normal. No respiratory distress.  Musculoskeletal:        General: No deformity or signs of injury.     Cervical back: No rigidity.  Skin:    General: Skin is warm and dry.     Coloration: Skin is not jaundiced or pale.  Neurological:     General: No focal deficit present.     Mental Status: He is alert and oriented to person, place, and time.     Procedures  Procedures  ED Course / MDM     Medially cleared by previous providers and awaiting placement.  Unchanged CT including opacities however no fever, no lactic acidosis, no leukocytosis.  Has been accepted to facility for placement. Discharged in stable condition.       Alvira Monday, MD 01/31/23 [phone]

## 2023-01-30 NOTE — Progress Notes (Addendum)
Awaiting PT.   Addend @ 10:42 AM This CSW was informed by PT that SNF is recommended. This CSW spoke with Richard Navarro and informed him of process. Pt faxed out. Bed offers pending.

## 2023-01-30 NOTE — Evaluation (Signed)
Physical Therapy Evaluation Patient Details Name: Richard Navarro MRN: [id number] DOB: 12/23/1945 Today's Date: 01/30/2023  History of Present Illness  Richard Navarro is a 77 y.o. male returns to hospital with cough and fever, recently admitted 6/1-6/8 for HCAP with effusion requiring chest tube placement on the right. They were sent home with plan for home health but that has not started as of yet.  He is weak to the point where he cannot help himself to the bathroom.  The ex-wife states basically he has been sitting in a chair since he got home. PMH: DM, GERD, HTN, HLD  Clinical Impression  Pt admitted with above diagnosis. Pt from home with significant other, needing increased assistance with ambulation to prevent falls without AD and increased assistance with self care; family reports pt was ind with community amb without AD and self care tasks prior to previous hospitalization. Pt needing min A to upright trunk into sitting and min G for steadying with transfers, verbal cues for hand placement with RW. Pt amb with RW, narrow BOS with 1 minor LOB needing min A to recover and prevent fall. Poor SpO2 waveform, dyspnea 2/4, improves with seated rest break. Pt would benefit from post acute PT (<3 hrs/day) prior to return home. Pt currently with functional limitations due to the deficits listed below (see PT Problem List). Pt will benefit from acute skilled PT to increase their independence and safety with mobility to allow discharge.          Recommendations for follow up therapy are one component of a multi-disciplinary discharge planning process, led by the attending physician.  Recommendations may be updated based on patient status, additional functional criteria and insurance authorization.  Follow Up Recommendations Can patient physically be transported by private vehicle: Yes     Assistance Recommended at Discharge Frequent or constant Supervision/Assistance  Patient can return home with  the following  A little help with walking and/or transfers;A little help with bathing/dressing/bathroom;Assistance with cooking/housework;Assist for transportation;Help with stairs or ramp for entrance    Equipment Recommendations None recommended by PT  Recommendations for Other Services       Functional Status Assessment Patient has had a recent decline in their functional status and demonstrates the ability to make significant improvements in function in a reasonable and predictable amount of time.     Precautions / Restrictions Precautions Precautions: Fall Precaution Comments: 2L PRN at home Restrictions Weight Bearing Restrictions: No      Mobility  Bed Mobility Overal bed mobility: Needs Assistance Bed Mobility: Supine to Sit, Sit to Supine     Supine to sit: Min assist Sit to supine: Min guard   General bed mobility comments: min A to upright trunk into sitting, min G with increased effort and attempts to lift BLE back onto gurney    Transfers Overall transfer level: Needs assistance Equipment used: Rolling walker (2 wheels) Transfers: Sit to/from Stand Sit to Stand: Min guard           General transfer comment: min G for steadying to power to stand from elevated gurney with RW, verbal cues for hand placement    Ambulation/Gait Ambulation/Gait assistance: Min guard, Min assist Gait Distance (Feet): 60 Feet Assistive device: Rolling walker (2 wheels) Gait Pattern/deviations: Step-through pattern, Decreased stride length, Trunk flexed, Narrow base of support Gait velocity: decreased     General Gait Details: Pt amb 60 ft with RW, narrow BOS with 1 LOB requiring min A to recover and prevent  LOB, dyspnea 2/4 with poor waveform on pulse ox unable to read  Stairs            Wheelchair Mobility    Modified Rankin (Stroke Patients Only)       Balance Overall balance assessment: Needs assistance   Sitting balance-Leahy Scale: Good     Standing  balance support: Reliant on assistive device for balance, During functional activity, Bilateral upper extremity supported Standing balance-Leahy Scale: Poor                               Pertinent Vitals/Pain Pain Assessment Pain Assessment: PAINAD Faces Pain Scale: Hurts even more Pain Location: R shoulder Pain Descriptors / Indicators: Grimacing, Guarding Pain Intervention(s): Limited activity within patient's tolerance, Monitored during session    Home Living Family/patient expects to be discharged to:: Private residence Living Arrangements: Spouse/significant other Available Help at Discharge: Family;Available 24 hours/day Type of Home: House Home Access: Stairs to enter   Entrance Stairs-Number of Steps: 1 Alternate Level Stairs-Number of Steps: flight Home Layout: Able to live on main level with bedroom/bathroom;Two level Home Equipment: Pharmacist, hospital (2 wheels) Additional Comments: 2L/min PRN, grab bars, reacher    Prior Function Prior Level of Function : Independent/Modified Independent             Mobility Comments: spouse reports pt was ind without AD, but declining since recent hospitalization and now furniture walking, near falls with family and furniture preventing falls ADLs Comments: spouse reports pt was ind, now needing supv and intermittent assist with self care tasks     Hand Dominance   Dominant Hand: Right    Extremity/Trunk Assessment   Upper Extremity Assessment Upper Extremity Assessment: Overall WFL for tasks assessed    Lower Extremity Assessment Lower Extremity Assessment: Overall WFL for tasks assessed (AROM WFL, strength grossly 4-/5, 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)    Cervical / Trunk Assessment Cervical / Trunk Assessment: Kyphotic  Communication   Communication: No difficulties  Cognition Arousal/Alertness: Awake/alert Behavior During Therapy: WFL for tasks  assessed/performed Overall Cognitive Status: Within Functional Limits for tasks assessed                                 General Comments: pt oriented to self and being in hospital, follows 1 step commands consistently, pleasant        General Comments      Exercises     Assessment/Plan    PT Assessment Patient needs continued PT services  PT Problem List Decreased strength;Decreased activity tolerance;Decreased balance;Decreased mobility;Decreased knowledge of use of DME;Decreased safety awareness;Pain       PT Treatment Interventions DME instruction;Gait training;Functional mobility training;Therapeutic activities;Therapeutic exercise;Balance training;Neuromuscular re-education;Stair training;Patient/family education    PT Goals (Current goals can be found in the Care Plan section)  Acute Rehab PT Goals Patient Stated Goal: get stronger and come home PT Goal Formulation: With patient/family Time For Goal Achievement: 02/13/23 Potential to Achieve Goals: Good    Frequency Min 1X/week     Co-evaluation               AM-PAC PT "6 Clicks" Mobility  Outcome Measure Help needed turning from your back to your side while in a flat bed without using bedrails?: A Little Help needed moving from lying on your back to sitting on the side of a  flat bed without using bedrails?: A Little Help needed moving to and from a bed to a chair (including a wheelchair)?: A Little Help needed standing up from a chair using your arms (e.g., wheelchair or bedside chair)?: A Little Help needed to walk in hospital room?: A Little Help needed climbing 3-5 steps with a railing? : A Lot 6 Click Score: 17    End of Session Equipment Utilized During Treatment: Gait belt Activity Tolerance: Patient tolerated treatment well Patient left: in bed;with call bell/phone within reach;with family/visitor present Nurse Communication: Mobility status PT Visit Diagnosis: Other abnormalities  of gait and mobility (R26.89);Muscle weakness (generalized) (M62.81)    Time: 1610-9604 PT Time Calculation (min) (ACUTE ONLY): 34 min   Charges:   PT Evaluation $PT Eval Low Complexity: 1 Low PT Treatments $Gait Training: 8-22 mins        Tori Majesti Gambrell PT, DPT 01/30/23, 10:47 AM

## 2023-01-30 NOTE — ED Notes (Signed)
Pt breakfast tray delivered to pt

## 2023-01-30 NOTE — ED Notes (Signed)
LCSW has contacted [facility] to arrange transport to accepting rehab [facility].

## 2023-01-30 NOTE — NC FL2 (Signed)
[place] MEDICAID FL2 LEVEL OF CARE FORM     IDENTIFICATION  Patient Name: Richard Navarro Birthdate: 29-Aug-1945 Sex: male Admission Date (Current Location): 01/29/2023  [facility] and [place] Number:  Producer, television/film/video and Address:  [facility],  [address] [place], [place] [address]      Provider Number: [id number]  Attending Physician Name and Address:  Default, Provider, MD  Relative Name and Phone Number:  [name] (Significant other) [phone]    Current Level of Care: Hospital Recommended Level of Care: Skilled Nursing Facility Prior Approval Number:    Date Approved/Denied:   PASRR Number: [id number]  Discharge Plan:      Current Diagnoses: Patient Active Problem List   Diagnosis Date Noted   Pleural effusion exudative 01/23/2023   HCAP (healthcare-associated pneumonia) 01/20/2023   Depressive disorder 10/24/2022   Chronic rhinitis 10/19/2022   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   Chronic hypoxemic respiratory failure (HCC) 08/04/2021   Dysphagia    Encounter for colorectal cancer screening    Prolonged QT interval 04/17/2021   Sepsis (HCC) 04/16/2021   Alcohol abuse 10/26/2020   Benign neoplasm of colon 10/26/2020   Carpal tunnel syndrome 10/26/2020   Family history of malignant neoplasm of gastrointestinal tract 10/26/2020   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   Tobacco use disorder 10/26/2020   Type II diabetes mellitus with peripheral circulatory disorder (HCC) 09/21/2020   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) 02/17/2020   Sepsis due to pneumonia (HCC) 01/26/2020   Recurrent major depressive disorder, 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   Thrombocytopenia (HCC)    Anemia 05/07/2018   Chronic kidney disease (CKD) stage G3b/A1, moderately decreased glomerular filtration rate (GFR) between 30-44 mL/min/1.73 square meter and albuminuria creatinine ratio less than 30 mg/g (HCC) 03/04/2018    Myalgia due to statin 03/04/2018   Lobar pneumonia (HCC) 08/09/2017   Neuroendocrine carcinoma (HCC) 08/09/2017   Encounter for antineoplastic chemotherapy 01/10/2017   Goals of care, counseling/discussion 01/10/2017   Primary cancer of right lung (HCC) 12/22/2016   COPD (chronic obstructive pulmonary disease) (HCC) 12/01/2016   PTSD (post-traumatic stress disorder) 12/07/2015   Prostate cancer screening 04/17/2014   Erectile dysfunction 04/09/2014   Arthritis 03/26/2014   History of adenomatous polyp of colon 03/26/2014   Former smoker 03/26/2014   Diabetes mellitus with renal manifestation (HCC)    Essential hypertension    Gout    Depression    GERD (gastroesophageal reflux disease)    Hyperlipidemia     Orientation RESPIRATION BLADDER Height & Weight     Self, Time, Situation, Place  Normal Incontinent Weight: 163 lb 2.3 oz (74 kg) Height:  6\' 3"  (190.5 cm)  BEHAVIORAL SYMPTOMS/MOOD NEUROLOGICAL BOWEL NUTRITION STATUS      Incontinent Diet (Regular)  AMBULATORY STATUS COMMUNICATION OF NEEDS Skin     Verbally Normal                       Personal Care Assistance Level of Assistance  Bathing, Feeding, Dressing Bathing Assistance: Limited assistance Feeding assistance: Limited assistance Dressing Assistance: Limited assistance     Functional Limitations Info  Sight, Hearing, Speech Sight Info: Adequate Hearing Info: Adequate Speech Info: Adequate    SPECIAL CARE FACTORS FREQUENCY  PT (By licensed PT), OT (By licensed OT)     PT Frequency: x5/week OT Frequency: x5/week            Contractures Contractures Info: Not present  Additional Factors Info  Code Status, Allergies, Psychotropic Code Status Info: Full Allergies Info: Afinitor (Everolimus)  Lisinopril  Other  Statins  Simvastatin           Current Medications (01/30/2023):  This is the current hospital active medication list Current Facility-Administered Medications  Medication Dose  Route Frequency Provider Last Rate Last Admin   acetaminophen (TYLENOL) tablet 650 mg  650 mg Oral Q6H PRN Gerhard Munch, MD   650 mg at 01/30/23 1019   albuterol (PROVENTIL) (2.5 MG/3ML) 0.083% nebulizer solution 2.5 mg  2.5 mg Nebulization Q4H PRN Long, Arlyss Repress, MD       ALPRAZolam Prudy Feeler) tablet 0.5 mg  0.5 mg Oral Daily PRN Gerhard Munch, MD   0.5 mg at 01/29/23 2120   amLODipine (NORVASC) tablet 10 mg  10 mg Oral QPM Gerhard Munch, MD   10 mg at 01/29/23 2040   amoxicillin-clavulanate (AUGMENTIN) 875-125 MG per tablet 1 tablet  1 tablet Oral BID Long, Arlyss Repress, MD   1 tablet at 01/30/23 0830   glipiZIDE (GLUCOTROL) tablet 5 mg  5 mg Oral QAC breakfast Gerhard Munch, MD   5 mg at 01/30/23 0831   metoprolol tartrate (LOPRESSOR) tablet 25 mg  25 mg Oral BID Gerhard Munch, MD   25 mg at 01/30/23 0831   ondansetron (ZOFRAN) tablet 8 mg  8 mg Oral Q8H PRN Gerhard Munch, MD       pantoprazole (PROTONIX) EC tablet 40 mg  40 mg Oral BID Gerhard Munch, MD   40 mg at 01/30/23 0831   Current Outpatient Medications  Medication Sig Dispense Refill   acetaminophen (TYLENOL) 325 MG tablet Take 2 tablets (650 mg total) by mouth every 6 (six) hours as needed for moderate pain or fever.     albuterol (VENTOLIN HFA) 108 (90 Base) MCG/ACT inhaler Inhale 2 puffs into the lungs every 4 (four) hours as needed for wheezing or shortness of breath. 3 each 2   allopurinol (ZYLOPRIM) 100 MG tablet TAKE 1 TABLET DAILY 90 tablet 3   ALPRAZolam (XANAX) 0.5 MG tablet Take 1 tablet (0.5 mg total) by mouth daily as needed for anxiety. 5 tablet 0   amLODipine (NORVASC) 10 MG tablet TAKE 1 TABLET DAILY (Patient taking differently: Take 10 mg by mouth every evening.) 90 tablet 3   amoxicillin-clavulanate (AUGMENTIN) 875-125 MG tablet Take 1 tablet by mouth 2 (two) times daily for 23 days. 46 tablet 0   Cholecalciferol (VITAMIN D3) 25 MCG (1000 UT) CAPS Take 1,000 Units by mouth daily.     docusate sodium  (COLACE) 100 MG capsule Take 300 mg by mouth daily.     feeding supplement, GLUCERNA SHAKE, (GLUCERNA SHAKE) LIQD Take 237 mLs by mouth 2 (two) times daily as needed (when not eating).     Ferrous Sulfate (IRON) 325 (65 Fe) MG TABS Take 325 mg by mouth See admin instructions. Take 325 mg by mouth every other night     GAVISCON EXTRA STRENGTH 160-105 MG CHEW Chew 1 tablet by mouth every 6 (six) hours as needed (for gas).     glipiZIDE (GLUCOTROL) 5 MG tablet TAKE 1 TABLET DAILY BEFORE BREAKFAST (Patient taking differently: Take 5 mg by mouth daily before breakfast.) 90 tablet 3   guaiFENesin (MUCINEX) 600 MG 12 hr tablet Take 600 mg by mouth 2 (two) times daily.     ketotifen (ZADITOR) 0.025 % ophthalmic solution Place 1 drop into both eyes 2 (two) times daily as needed (allergies).  magnesium hydroxide (MILK OF MAGNESIA) 400 MG/5ML suspension Take 30 mLs by mouth daily as needed (constipation).     metoprolol tartrate (LOPRESSOR) 25 MG tablet TAKE 1 TABLET TWICE A DAY (Patient taking differently: Take 25 mg by mouth 2 (two) times daily.) 180 tablet 3   OXYGEN Inhale 2 L/min into the lungs as needed (for shortness of breath).     pantoprazole (PROTONIX) 40 MG tablet Take 1 tablet (40 mg total) by mouth 2 (two) times daily. 180 tablet 3   polyethylene glycol (MIRALAX / GLYCOLAX) 17 g packet Take 17 g by mouth See admin instructions. Mix 17 grams into the suggested amount of water and drink once a day- HOLD FOR DIARRHEA     simethicone (MYLICON) 125 MG chewable tablet Chew 125 mg by mouth every 6 (six) hours as needed for flatulence.     Tiotropium Bromide-Olodaterol (STIOLTO RESPIMAT) 2.5-2.5 MCG/ACT AERS Inhale 2 puffs into the lungs daily. 4 g 11   vitamin B-12 (CYANOCOBALAMIN) 1000 MCG tablet Take 1,000 mcg by mouth daily.      vitamin C (ASCORBIC ACID) 500 MG tablet Take 500 mg by mouth daily.     capecitabine (XELODA) 500 MG tablet TAKE 3 TABLETS BY MOUTH TWICE DAILY FOR 14 DAYS EVERY 4 WEEKS.  (Patient not taking: Reported on 01/29/2023) 84 tablet 2   Continuous Blood Gluc Receiver (DEXCOM G7 RECEIVER) DEVI Please provide 1 receiver (Patient not taking: Reported on 01/29/2023) 1 each 0   Continuous Blood Gluc Sensor (DEXCOM G7 SENSOR) MISC 1 Box by Does not apply route every 30 (thirty) days. (Patient not taking: Reported on 01/29/2023) 3 each 3   glucose blood test strip Use to test blood sugar twice a day 200 each 3   ondansetron (ZOFRAN) 8 MG tablet TAKE 1 TABLET BY MOUTH EVERY 8 HOURS AS NEEDED FOR NAUSEA AND/OR VOMITING (Patient not taking: Reported on 01/29/2023) 30 tablet 0   temozolomide (TEMODAR) 100 MG capsule Take 4 capsules by mouth daily on day 10, 11, 12, 13, and 14 every 4 weeks. Take on an empty stomach or at bedtime to decrease nausea or vomiting (Patient not taking: Reported on 01/29/2023) 20 capsule 3     Discharge Medications: Please see discharge summary for a list of discharge medications.  Relevant Imaging Results:  Relevant Lab Results:   Additional Information SSN: [id number]  Princella Ion, LCSW

## 2023-01-31 DIAGNOSIS — R531 Weakness: Secondary | ICD-10-CM | POA: Diagnosis not present

## 2023-01-31 DIAGNOSIS — N1831 Chronic kidney disease, stage 3a: Secondary | ICD-10-CM | POA: Diagnosis not present

## 2023-01-31 DIAGNOSIS — F419 Anxiety disorder, unspecified: Secondary | ICD-10-CM | POA: Diagnosis not present

## 2023-01-31 DIAGNOSIS — J189 Pneumonia, unspecified organism: Secondary | ICD-10-CM | POA: Diagnosis not present

## 2023-01-31 DIAGNOSIS — C7A8 Other malignant neuroendocrine tumors: Secondary | ICD-10-CM | POA: Diagnosis not present

## 2023-01-31 LAB — MISC LABCORP TEST (SEND OUT): Labcorp test code: 9985

## 2023-02-01 LAB — CULTURE, BLOOD (ROUTINE X 2): Culture: NO GROWTH

## 2023-02-02 ENCOUNTER — Other Ambulatory Visit: Payer: Self-pay | Admitting: *Deleted

## 2023-02-02 NOTE — Patient Outreach (Signed)
Richard Navarro recently admitted to [facility] skilled nursing facility under [facility] SN[facility] waiver. Screening for potential [facility] care coordination services as benefit of health plan and PCP.   Secure communication sent to Fredia Beets SW, to make aware writer is following for transition plans and needs.   Raiford Noble, MSN, RN,BSN [facility] Post Acute Care Coordinator [phone] (Direct dial)

## 2023-02-03 LAB — CULTURE, BLOOD (ROUTINE X 2): Culture: NO GROWTH

## 2023-02-05 ENCOUNTER — Other Ambulatory Visit: Payer: Self-pay | Admitting: Family Medicine

## 2023-02-14 DIAGNOSIS — I700 Atherosclerosis of aorta: Secondary | ICD-10-CM | POA: Diagnosis not present

## 2023-02-14 DIAGNOSIS — F32A Depression, unspecified: Secondary | ICD-10-CM | POA: Diagnosis not present

## 2023-02-14 DIAGNOSIS — J9621 Acute and chronic respiratory failure with hypoxia: Secondary | ICD-10-CM | POA: Diagnosis not present

## 2023-02-14 DIAGNOSIS — M109 Gout, unspecified: Secondary | ICD-10-CM | POA: Diagnosis not present

## 2023-02-14 DIAGNOSIS — Z7984 Long term (current) use of oral hypoglycemic drugs: Secondary | ICD-10-CM | POA: Diagnosis not present

## 2023-02-14 DIAGNOSIS — F419 Anxiety disorder, unspecified: Secondary | ICD-10-CM | POA: Diagnosis not present

## 2023-02-14 DIAGNOSIS — E1122 Type 2 diabetes mellitus with diabetic chronic kidney disease: Secondary | ICD-10-CM | POA: Diagnosis not present

## 2023-02-14 DIAGNOSIS — C7A090 Malignant carcinoid tumor of the bronchus and lung: Secondary | ICD-10-CM | POA: Diagnosis not present

## 2023-02-14 DIAGNOSIS — N1832 Chronic kidney disease, stage 3b: Secondary | ICD-10-CM | POA: Diagnosis not present

## 2023-02-14 DIAGNOSIS — I10 Essential (primary) hypertension: Secondary | ICD-10-CM | POA: Diagnosis not present

## 2023-02-14 DIAGNOSIS — F4310 Post-traumatic stress disorder, unspecified: Secondary | ICD-10-CM | POA: Diagnosis not present

## 2023-02-14 DIAGNOSIS — E340 Carcinoid syndrome: Secondary | ICD-10-CM | POA: Diagnosis not present

## 2023-02-14 DIAGNOSIS — Z87891 Personal history of nicotine dependence: Secondary | ICD-10-CM | POA: Diagnosis not present

## 2023-02-14 DIAGNOSIS — I2510 Atherosclerotic heart disease of native coronary artery without angina pectoris: Secondary | ICD-10-CM | POA: Diagnosis not present

## 2023-02-14 DIAGNOSIS — J439 Emphysema, unspecified: Secondary | ICD-10-CM | POA: Diagnosis not present

## 2023-02-14 DIAGNOSIS — C7B02 Secondary carcinoid tumors of liver: Secondary | ICD-10-CM | POA: Diagnosis not present

## 2023-02-16 ENCOUNTER — Telehealth: Payer: Self-pay | Admitting: Family Medicine

## 2023-02-16 NOTE — Telephone Encounter (Signed)
Home Health Verbal Orders  Agency:  [facility]  Caller: Liji  Contact and title  Requesting OT/ PT/ Skilled nursing/ Social Work/ Speech:    Reason for Request:  Request home health PT, 1 week 1, 2 times for 3 weeks, 1 for 3 weeks to work on gait , balance and strengthening  Frequency:    HH needs F2F w/in last 30 days     [phone]

## 2023-02-16 NOTE — Telephone Encounter (Signed)
Called and spoke with Liji and VO given.

## 2023-02-21 DIAGNOSIS — N1832 Chronic kidney disease, stage 3b: Secondary | ICD-10-CM | POA: Diagnosis not present

## 2023-02-21 DIAGNOSIS — C7B02 Secondary carcinoid tumors of liver: Secondary | ICD-10-CM | POA: Diagnosis not present

## 2023-02-21 DIAGNOSIS — J439 Emphysema, unspecified: Secondary | ICD-10-CM | POA: Diagnosis not present

## 2023-02-21 DIAGNOSIS — C7A090 Malignant carcinoid tumor of the bronchus and lung: Secondary | ICD-10-CM | POA: Diagnosis not present

## 2023-02-21 DIAGNOSIS — F32A Depression, unspecified: Secondary | ICD-10-CM | POA: Diagnosis not present

## 2023-02-21 DIAGNOSIS — E1122 Type 2 diabetes mellitus with diabetic chronic kidney disease: Secondary | ICD-10-CM | POA: Diagnosis not present

## 2023-02-23 DIAGNOSIS — E1122 Type 2 diabetes mellitus with diabetic chronic kidney disease: Secondary | ICD-10-CM | POA: Diagnosis not present

## 2023-02-23 DIAGNOSIS — C7A090 Malignant carcinoid tumor of the bronchus and lung: Secondary | ICD-10-CM | POA: Diagnosis not present

## 2023-02-23 DIAGNOSIS — F32A Depression, unspecified: Secondary | ICD-10-CM | POA: Diagnosis not present

## 2023-02-23 DIAGNOSIS — C7B02 Secondary carcinoid tumors of liver: Secondary | ICD-10-CM | POA: Diagnosis not present

## 2023-02-23 DIAGNOSIS — N1832 Chronic kidney disease, stage 3b: Secondary | ICD-10-CM | POA: Diagnosis not present

## 2023-02-23 DIAGNOSIS — J439 Emphysema, unspecified: Secondary | ICD-10-CM | POA: Diagnosis not present

## 2023-02-26 ENCOUNTER — Telehealth: Payer: Self-pay | Admitting: Family Medicine

## 2023-02-26 NOTE — Telephone Encounter (Signed)
Home Health Verbal Orders  Agency:  [facility]  Caller: Rande Brunt and title  Ph# [phone] -OT Requesting OT:    Reason for Request:  Occupation Therapy evaluation was completed  Frequency:  1 x per week for 5 weeks  HH needs F2F w/in last 30 days

## 2023-02-26 NOTE — Telephone Encounter (Signed)
Called and spoke with [place] and VO given.

## 2023-02-27 ENCOUNTER — Encounter: Payer: Self-pay | Admitting: Podiatry

## 2023-02-27 ENCOUNTER — Ambulatory Visit (INDEPENDENT_AMBULATORY_CARE_PROVIDER_SITE_OTHER): Payer: Medicare Other | Admitting: Podiatry

## 2023-02-27 VITALS — BP 130/58

## 2023-02-27 DIAGNOSIS — M205X1 Other deformities of toe(s) (acquired), right foot: Secondary | ICD-10-CM

## 2023-02-27 DIAGNOSIS — B351 Tinea unguium: Secondary | ICD-10-CM | POA: Diagnosis not present

## 2023-02-27 DIAGNOSIS — M79675 Pain in left toe(s): Secondary | ICD-10-CM

## 2023-02-27 DIAGNOSIS — E119 Type 2 diabetes mellitus without complications: Secondary | ICD-10-CM

## 2023-02-27 DIAGNOSIS — E1151 Type 2 diabetes mellitus with diabetic peripheral angiopathy without gangrene: Secondary | ICD-10-CM

## 2023-02-27 DIAGNOSIS — C7A090 Malignant carcinoid tumor of the bronchus and lung: Secondary | ICD-10-CM | POA: Diagnosis not present

## 2023-02-27 DIAGNOSIS — F32A Depression, unspecified: Secondary | ICD-10-CM | POA: Diagnosis not present

## 2023-02-27 DIAGNOSIS — M205X2 Other deformities of toe(s) (acquired), left foot: Secondary | ICD-10-CM | POA: Diagnosis not present

## 2023-02-27 DIAGNOSIS — E1122 Type 2 diabetes mellitus with diabetic chronic kidney disease: Secondary | ICD-10-CM | POA: Diagnosis not present

## 2023-02-27 DIAGNOSIS — J439 Emphysema, unspecified: Secondary | ICD-10-CM | POA: Diagnosis not present

## 2023-02-27 DIAGNOSIS — C7B02 Secondary carcinoid tumors of liver: Secondary | ICD-10-CM | POA: Diagnosis not present

## 2023-02-27 DIAGNOSIS — M79674 Pain in right toe(s): Secondary | ICD-10-CM | POA: Diagnosis not present

## 2023-02-27 DIAGNOSIS — N1832 Chronic kidney disease, stage 3b: Secondary | ICD-10-CM | POA: Diagnosis not present

## 2023-02-28 DIAGNOSIS — E1122 Type 2 diabetes mellitus with diabetic chronic kidney disease: Secondary | ICD-10-CM | POA: Diagnosis not present

## 2023-02-28 DIAGNOSIS — C7B02 Secondary carcinoid tumors of liver: Secondary | ICD-10-CM | POA: Diagnosis not present

## 2023-02-28 DIAGNOSIS — J439 Emphysema, unspecified: Secondary | ICD-10-CM | POA: Diagnosis not present

## 2023-02-28 DIAGNOSIS — C7A090 Malignant carcinoid tumor of the bronchus and lung: Secondary | ICD-10-CM | POA: Diagnosis not present

## 2023-02-28 DIAGNOSIS — N1832 Chronic kidney disease, stage 3b: Secondary | ICD-10-CM | POA: Diagnosis not present

## 2023-02-28 DIAGNOSIS — F32A Depression, unspecified: Secondary | ICD-10-CM | POA: Diagnosis not present

## 2023-03-01 DIAGNOSIS — C7B02 Secondary carcinoid tumors of liver: Secondary | ICD-10-CM | POA: Diagnosis not present

## 2023-03-01 DIAGNOSIS — C7A090 Malignant carcinoid tumor of the bronchus and lung: Secondary | ICD-10-CM | POA: Diagnosis not present

## 2023-03-01 DIAGNOSIS — J439 Emphysema, unspecified: Secondary | ICD-10-CM | POA: Diagnosis not present

## 2023-03-01 DIAGNOSIS — E1122 Type 2 diabetes mellitus with diabetic chronic kidney disease: Secondary | ICD-10-CM | POA: Diagnosis not present

## 2023-03-01 DIAGNOSIS — N1832 Chronic kidney disease, stage 3b: Secondary | ICD-10-CM | POA: Diagnosis not present

## 2023-03-01 DIAGNOSIS — F32A Depression, unspecified: Secondary | ICD-10-CM | POA: Diagnosis not present

## 2023-03-02 ENCOUNTER — Telehealth: Payer: Self-pay | Admitting: Family Medicine

## 2023-03-02 NOTE — Telephone Encounter (Signed)
Patient dropped off document Home Health Certificate (Order ID [id number]), to be filled out by provider. Patient requested to send it back via Fax within 5-days. Document is located in providers tray at front office.Please advise
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
## 2023-03-05 ENCOUNTER — Other Ambulatory Visit: Payer: Self-pay | Admitting: *Deleted

## 2023-03-05 DIAGNOSIS — C7B02 Secondary carcinoid tumors of liver: Secondary | ICD-10-CM | POA: Diagnosis not present

## 2023-03-05 DIAGNOSIS — N1832 Chronic kidney disease, stage 3b: Secondary | ICD-10-CM | POA: Diagnosis not present

## 2023-03-05 DIAGNOSIS — C7A090 Malignant carcinoid tumor of the bronchus and lung: Secondary | ICD-10-CM | POA: Diagnosis not present

## 2023-03-05 DIAGNOSIS — E1122 Type 2 diabetes mellitus with diabetic chronic kidney disease: Secondary | ICD-10-CM | POA: Diagnosis not present

## 2023-03-05 DIAGNOSIS — J439 Emphysema, unspecified: Secondary | ICD-10-CM | POA: Diagnosis not present

## 2023-03-05 DIAGNOSIS — F32A Depression, unspecified: Secondary | ICD-10-CM | POA: Diagnosis not present

## 2023-03-05 NOTE — Telephone Encounter (Signed)
 Forms faxed back.

## 2023-03-05 NOTE — Patient Outreach (Signed)
Post-Acute Care Coordinator follow up.   Confirmed with Alphonzo Lemmings, Biochemist, clinical. Mr. Talarico transitioned to home with home health on 02/13/23.    Telephone call made to Mr. Leonidas Romberg [phone]. Patient identifiers confirmed. Mr. Vanloan reports he is doing fine.  States home health has made visits.  Explained care coordination services. Mr. Withrow denies having any care coordination needs at this time.   No identifiable care coordination needs.   Raiford Noble, MSN, RN,BSN [facility] Post Acute Care Coordinator [phone] (Direct dial)

## 2023-03-07 ENCOUNTER — Encounter: Payer: Self-pay | Admitting: Emergency Medicine

## 2023-03-07 ENCOUNTER — Ambulatory Visit (INDEPENDENT_AMBULATORY_CARE_PROVIDER_SITE_OTHER): Payer: Medicare Other

## 2023-03-07 ENCOUNTER — Ambulatory Visit (INDEPENDENT_AMBULATORY_CARE_PROVIDER_SITE_OTHER): Payer: Medicare Other | Admitting: Emergency Medicine

## 2023-03-07 VITALS — BP 118/68 | Temp 97.8°F | Ht 75.0 in | Wt 155.8 lb

## 2023-03-07 DIAGNOSIS — N1832 Chronic kidney disease, stage 3b: Secondary | ICD-10-CM | POA: Diagnosis not present

## 2023-03-07 DIAGNOSIS — J449 Chronic obstructive pulmonary disease, unspecified: Secondary | ICD-10-CM

## 2023-03-07 DIAGNOSIS — J181 Lobar pneumonia, unspecified organism: Secondary | ICD-10-CM

## 2023-03-07 DIAGNOSIS — A419 Sepsis, unspecified organism: Secondary | ICD-10-CM | POA: Diagnosis not present

## 2023-03-07 DIAGNOSIS — J189 Pneumonia, unspecified organism: Secondary | ICD-10-CM

## 2023-03-07 DIAGNOSIS — C7B02 Secondary carcinoid tumors of liver: Secondary | ICD-10-CM | POA: Diagnosis not present

## 2023-03-07 DIAGNOSIS — E1122 Type 2 diabetes mellitus with diabetic chronic kidney disease: Secondary | ICD-10-CM | POA: Diagnosis not present

## 2023-03-07 DIAGNOSIS — F32A Depression, unspecified: Secondary | ICD-10-CM | POA: Diagnosis not present

## 2023-03-07 DIAGNOSIS — Z859 Personal history of malignant neoplasm, unspecified: Secondary | ICD-10-CM | POA: Diagnosis not present

## 2023-03-07 DIAGNOSIS — R918 Other nonspecific abnormal finding of lung field: Secondary | ICD-10-CM | POA: Diagnosis not present

## 2023-03-07 DIAGNOSIS — J9859 Other diseases of mediastinum, not elsewhere classified: Secondary | ICD-10-CM | POA: Diagnosis not present

## 2023-03-07 DIAGNOSIS — C7A090 Malignant carcinoid tumor of the bronchus and lung: Secondary | ICD-10-CM | POA: Diagnosis not present

## 2023-03-07 DIAGNOSIS — J90 Pleural effusion, not elsewhere classified: Secondary | ICD-10-CM

## 2023-03-07 DIAGNOSIS — J9611 Chronic respiratory failure with hypoxia: Secondary | ICD-10-CM

## 2023-03-07 DIAGNOSIS — J439 Emphysema, unspecified: Secondary | ICD-10-CM | POA: Diagnosis not present

## 2023-03-07 NOTE — Assessment & Plan Note (Deleted)
Recent postobstructive right lower lobe pneumonia with an associated parapneumonic effusion that required tube drainage and lytics.  Plan to repeat his chest x-ray today.

## 2023-03-07 NOTE — Patient Instructions (Addendum)
We will repeat chest x-ray today to compare with priors Please continue Stiolto 2 puffs once daily.  Will try adding a spacer to optimize delivery into your chest. Keep albuterol available to use either 2 puffs or 1 nebulizer treatment up to every 4 hours if needed for shortness of breath, chest tightness, wheezing. We will follow-up with [employer] regarding availability of your portable oxygen concentrator.  Please continue your oxygen with exertion as you have been doing Get your CT scan of the chest as planned next month Follow with Dr. Arbutus Ped as planned Follow Dr. Delton Coombes in 2 months, sooner if you have any problems.

## 2023-03-07 NOTE — Assessment & Plan Note (Signed)
 Repeat chest xray today.

## 2023-03-07 NOTE — Assessment & Plan Note (Signed)
Still trying to get him a portable oxygen concentrator from [employer].

## 2023-03-07 NOTE — Progress Notes (Signed)
Subjective:    Patient ID: [name], male    DOB: [birth year], 77 y.o.   MRN: [id number]  HPI  ROV 10/19/22 --Mr. 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  Also with COPD and hypoxemic respiratory failure on 2 L/min.  He needs a portable oxygen concentrator, has been having trouble getting this through his PCP due to technicalities with his DME.  Followed by Dr. Arbutus Ped and currently undergoing systemic Xeloda, Temodar. I tried starting him on Stiolto back in November 2022, he reports that he didn't continue it but cannot remember why he stopped it.  He is currently on Spiriva Respimat.  He notes an period of increased cough about 3 weeks ago, has had sneezing as well, nasal congestion and drainage. Having more exertional SOB.   CT chest abdomen and pelvis done 08/31/2022 reviewed by me shows slight decrease in size of a medial right lower lobe soft tissue mass, slight increase in adjacent tiny lymph node.  There is associated occlusion of the right lower lobe bronchus.  Prominent subcarinal lymphadenopathy that is unchanged.  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  Has been followed by Dr. Arbutus Ped for this, chemotherapy held in March 2024.  Unfortunately hospitalized in early June with a right postobstructive pneumonia and associated parapneumonic effusion.  He had a chest tube and required lytics.  It was removed on 01/25/2023.  He is scheduled to undergo a repeat CT scan of his chest abdomen and pelvis on 04/09/2023. He reports low energy - taking him a bit longer to recover, but he does feel that his cough is better. He has some intermittent R chest discomfort, having difficulty getting a deep breath in.   CT chest  01/29/2023 reviewed by me showed markedly severe paraseptal and centrilobular emphysema, stable posterior right lower lobe and mild posterior left lower lobe airspace disease, persistent right lower lobe mass, small stable right pleural effusion  Review of Systems As per HPI     Objective:   Physical Exam   Vitals:   03/07/23 1019  BP: 118/68  Temp: 97.8 F (36.6 C)  TempSrc: Oral  Weight: 155 lb 12.8 oz (70.7 kg)  Height: 6\' 3"  (1.905 m)   Gen: Pleasant, thin, in no distress,  normal affect  ENT: Small ulceration left posterior pharynx. Mouth clear,  oropharynx clear, no postnasal drip  Neck: No JVD, no stridor  Lungs: No use of accessory muscles, decreased breath sounds on the right, clear on the left  Cardiovascular: RRR, heart sounds normal, no murmur or gallops, no peripheral edema  Musculoskeletal: No deformities, no cyanosis or clubbing  Neuro: alert, non focal  Skin: Warm, no lesions or rash     Assessment & Plan:  COPD (chronic obstructive pulmonary disease) (HCC) We will repeat chest x-ray today to compare with priors Please continue Stiolto 2 puffs once daily.  Will try adding a spacer to optimize delivery into your chest. Keep albuterol available to use either 2 puffs or 1 nebulizer treatment up to every 4 hours if needed for shortness of breath, chest tightness, wheezing. Follow Dr. Delton Coombes in 2 months, sooner if you have any problems.  HCAP (healthcare-associated pneumonia) Recent postobstructive right lower lobe pneumonia with an associated parapneumonic effusion that required tube drainage  and lytics.  Plan to repeat his chest x-ray today.  Pleural effusion exudative Repeat chest x-ray today  Chronic hypoxemic respiratory failure (HCC) Still trying to get him a portable oxygen concentrator from [employer].   Levy Pupa, MD, PhD 03/07/2023, 11:06 AM [facility] [phone] or if no answer [phone]

## 2023-03-07 NOTE — Assessment & Plan Note (Signed)
We will repeat chest x-ray today to compare with priors Please continue Stiolto 2 puffs once daily.  Will try adding a spacer to optimize delivery into your chest. Keep albuterol available to use either 2 puffs or 1 nebulizer treatment up to every 4 hours if needed for shortness of breath, chest tightness, wheezing. Follow Dr. Delton Coombes in 2 months, sooner if you have any problems.

## 2023-03-07 NOTE — Assessment & Plan Note (Signed)
Recent postobstructive right lower lobe pneumonia with an associated parapneumonic effusion that required tube drainage and lytics.  Plan to repeat his chest x-ray today.

## 2023-03-08 DIAGNOSIS — C7B02 Secondary carcinoid tumors of liver: Secondary | ICD-10-CM | POA: Diagnosis not present

## 2023-03-08 DIAGNOSIS — F32A Depression, unspecified: Secondary | ICD-10-CM | POA: Diagnosis not present

## 2023-03-08 DIAGNOSIS — N1832 Chronic kidney disease, stage 3b: Secondary | ICD-10-CM | POA: Diagnosis not present

## 2023-03-08 DIAGNOSIS — J439 Emphysema, unspecified: Secondary | ICD-10-CM | POA: Diagnosis not present

## 2023-03-08 DIAGNOSIS — E1122 Type 2 diabetes mellitus with diabetic chronic kidney disease: Secondary | ICD-10-CM | POA: Diagnosis not present

## 2023-03-08 DIAGNOSIS — C7A090 Malignant carcinoid tumor of the bronchus and lung: Secondary | ICD-10-CM | POA: Diagnosis not present

## 2023-03-12 DIAGNOSIS — F32A Depression, unspecified: Secondary | ICD-10-CM | POA: Diagnosis not present

## 2023-03-12 DIAGNOSIS — C7B02 Secondary carcinoid tumors of liver: Secondary | ICD-10-CM | POA: Diagnosis not present

## 2023-03-12 DIAGNOSIS — N1832 Chronic kidney disease, stage 3b: Secondary | ICD-10-CM | POA: Diagnosis not present

## 2023-03-12 DIAGNOSIS — E1122 Type 2 diabetes mellitus with diabetic chronic kidney disease: Secondary | ICD-10-CM | POA: Diagnosis not present

## 2023-03-12 DIAGNOSIS — J439 Emphysema, unspecified: Secondary | ICD-10-CM | POA: Diagnosis not present

## 2023-03-12 DIAGNOSIS — C7A090 Malignant carcinoid tumor of the bronchus and lung: Secondary | ICD-10-CM | POA: Diagnosis not present

## 2023-03-14 DIAGNOSIS — C7B02 Secondary carcinoid tumors of liver: Secondary | ICD-10-CM | POA: Diagnosis not present

## 2023-03-14 DIAGNOSIS — E1122 Type 2 diabetes mellitus with diabetic chronic kidney disease: Secondary | ICD-10-CM | POA: Diagnosis not present

## 2023-03-14 DIAGNOSIS — F32A Depression, unspecified: Secondary | ICD-10-CM | POA: Diagnosis not present

## 2023-03-14 DIAGNOSIS — N1832 Chronic kidney disease, stage 3b: Secondary | ICD-10-CM | POA: Diagnosis not present

## 2023-03-14 DIAGNOSIS — J439 Emphysema, unspecified: Secondary | ICD-10-CM | POA: Diagnosis not present

## 2023-03-14 DIAGNOSIS — C7A090 Malignant carcinoid tumor of the bronchus and lung: Secondary | ICD-10-CM | POA: Diagnosis not present

## 2023-03-16 DIAGNOSIS — I700 Atherosclerosis of aorta: Secondary | ICD-10-CM | POA: Diagnosis not present

## 2023-03-16 DIAGNOSIS — F419 Anxiety disorder, unspecified: Secondary | ICD-10-CM | POA: Diagnosis not present

## 2023-03-16 DIAGNOSIS — F4310 Post-traumatic stress disorder, unspecified: Secondary | ICD-10-CM | POA: Diagnosis not present

## 2023-03-16 DIAGNOSIS — F32A Depression, unspecified: Secondary | ICD-10-CM | POA: Diagnosis not present

## 2023-03-16 DIAGNOSIS — Z7984 Long term (current) use of oral hypoglycemic drugs: Secondary | ICD-10-CM | POA: Diagnosis not present

## 2023-03-16 DIAGNOSIS — M109 Gout, unspecified: Secondary | ICD-10-CM | POA: Diagnosis not present

## 2023-03-16 DIAGNOSIS — E340 Carcinoid syndrome: Secondary | ICD-10-CM | POA: Diagnosis not present

## 2023-03-16 DIAGNOSIS — J9621 Acute and chronic respiratory failure with hypoxia: Secondary | ICD-10-CM | POA: Diagnosis not present

## 2023-03-16 DIAGNOSIS — C7B02 Secondary carcinoid tumors of liver: Secondary | ICD-10-CM | POA: Diagnosis not present

## 2023-03-16 DIAGNOSIS — N1832 Chronic kidney disease, stage 3b: Secondary | ICD-10-CM | POA: Diagnosis not present

## 2023-03-16 DIAGNOSIS — Z87891 Personal history of nicotine dependence: Secondary | ICD-10-CM | POA: Diagnosis not present

## 2023-03-16 DIAGNOSIS — J439 Emphysema, unspecified: Secondary | ICD-10-CM | POA: Diagnosis not present

## 2023-03-16 DIAGNOSIS — E1122 Type 2 diabetes mellitus with diabetic chronic kidney disease: Secondary | ICD-10-CM | POA: Diagnosis not present

## 2023-03-16 DIAGNOSIS — I10 Essential (primary) hypertension: Secondary | ICD-10-CM | POA: Diagnosis not present

## 2023-03-16 DIAGNOSIS — I2510 Atherosclerotic heart disease of native coronary artery without angina pectoris: Secondary | ICD-10-CM | POA: Diagnosis not present

## 2023-03-16 DIAGNOSIS — C7A090 Malignant carcinoid tumor of the bronchus and lung: Secondary | ICD-10-CM | POA: Diagnosis not present

## 2023-03-20 DIAGNOSIS — F32A Depression, unspecified: Secondary | ICD-10-CM | POA: Diagnosis not present

## 2023-03-20 DIAGNOSIS — E1122 Type 2 diabetes mellitus with diabetic chronic kidney disease: Secondary | ICD-10-CM | POA: Diagnosis not present

## 2023-03-20 DIAGNOSIS — C7B02 Secondary carcinoid tumors of liver: Secondary | ICD-10-CM | POA: Diagnosis not present

## 2023-03-20 DIAGNOSIS — J439 Emphysema, unspecified: Secondary | ICD-10-CM | POA: Diagnosis not present

## 2023-03-20 DIAGNOSIS — N1832 Chronic kidney disease, stage 3b: Secondary | ICD-10-CM | POA: Diagnosis not present

## 2023-03-20 DIAGNOSIS — C7A090 Malignant carcinoid tumor of the bronchus and lung: Secondary | ICD-10-CM | POA: Diagnosis not present

## 2023-03-22 ENCOUNTER — Encounter: Payer: Self-pay | Admitting: Family Medicine

## 2023-03-22 ENCOUNTER — Ambulatory Visit (INDEPENDENT_AMBULATORY_CARE_PROVIDER_SITE_OTHER): Payer: Medicare Other | Admitting: Family Medicine

## 2023-03-22 ENCOUNTER — Other Ambulatory Visit (HOSPITAL_COMMUNITY): Payer: Self-pay

## 2023-03-22 VITALS — BP 110/64 | HR 74 | Temp 97.2°F | Wt 158.8 lb

## 2023-03-22 DIAGNOSIS — E1122 Type 2 diabetes mellitus with diabetic chronic kidney disease: Secondary | ICD-10-CM

## 2023-03-22 DIAGNOSIS — J189 Pneumonia, unspecified organism: Secondary | ICD-10-CM

## 2023-03-22 DIAGNOSIS — E785 Hyperlipidemia, unspecified: Secondary | ICD-10-CM

## 2023-03-22 DIAGNOSIS — N1832 Chronic kidney disease, stage 3b: Secondary | ICD-10-CM | POA: Diagnosis not present

## 2023-03-22 DIAGNOSIS — F331 Major depressive disorder, recurrent, moderate: Secondary | ICD-10-CM | POA: Diagnosis not present

## 2023-03-22 DIAGNOSIS — G720 Drug-induced myopathy: Secondary | ICD-10-CM

## 2023-03-22 DIAGNOSIS — F3342 Major depressive disorder, recurrent, in full remission: Secondary | ICD-10-CM

## 2023-03-22 DIAGNOSIS — I10 Essential (primary) hypertension: Secondary | ICD-10-CM | POA: Diagnosis not present

## 2023-03-22 MED ORDER — DESVENLAFAXINE SUCCINATE ER 25 MG PO TB24: 25.0000 mg | ORAL_TABLET | Freq: Every day | ORAL | 3 refills | Status: DC

## 2023-03-22 MED ORDER — DESVENLAFAXINE SUCCINATE ER 25 MG PO TB24
25.0000 mg | ORAL_TABLET | Freq: Every day | ORAL | 5 refills | Status: DC
  Filled 2023-03-22: qty 30, 30d supply, fill #0

## 2023-03-22 NOTE — Progress Notes (Signed)
Phone [phone] In person visit   Subjective:   Richard Navarro is a 77 y.o. year old very pleasant male patient who presents for/with See problem oriented charting Chief Complaint  Patient presents with   hosp f/u    Pt here to f/u from hosp visit for chest wall pain    Past Medical History-  Patient Active Problem List   Diagnosis Date Noted   Chronic hypoxemic respiratory failure (HCC) 08/04/2021    Priority: High   Anemia 05/07/2018    Priority: High   Chronic kidney disease (CKD) stage G3b/A1, moderately decreased glomerular filtration rate (GFR) between 30-44 mL/min/1.73 square meter and albuminuria creatinine ratio less than 30 mg/g (HCC) 03/04/2018    Priority: High   Neuroendocrine carcinoma (HCC) 08/09/2017    Priority: High   Primary cancer of right lung (HCC) 12/22/2016    Priority: High   Diabetes mellitus with renal manifestation (HCC)     Priority: High   Myalgia due to statin 03/04/2018    Priority: Medium    COPD (chronic obstructive pulmonary disease) (HCC) 12/01/2016    Priority: Medium    PTSD (post-traumatic stress disorder) 12/07/2015    Priority: Medium    Erectile dysfunction 04/09/2014    Priority: Medium    Former smoker 03/26/2014    Priority: Medium    Essential hypertension     Priority: Medium    Gout     Priority: Medium    Depression     Priority: Medium    Hyperlipidemia     Priority: Medium    Lobar pneumonia (HCC) 08/09/2017    Priority: Low   Encounter for antineoplastic chemotherapy 01/10/2017    Priority: Low   Goals of care, counseling/discussion 01/10/2017    Priority: Low   Prostate cancer screening 04/17/2014    Priority: Low   Arthritis 03/26/2014    Priority: Low   History of adenomatous polyp of colon 03/26/2014    Priority: Low   GERD (gastroesophageal reflux disease)     Priority: Low   Pleural effusion exudative 01/23/2023   HCAP (healthcare-associated pneumonia) 01/20/2023   Depressive disorder  10/24/2022   Chronic rhinitis 10/19/2022   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   Dysphagia    Encounter for colorectal cancer screening    Prolonged QT interval 04/17/2021   Sepsis (HCC) 04/16/2021   Alcohol abuse 10/26/2020   Benign neoplasm of colon 10/26/2020   Carpal tunnel syndrome 10/26/2020   Family history of malignant neoplasm of gastrointestinal tract 10/26/2020   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     albuterol (VENTOLIN HFA) 108 (90 Base) MCG/ACT inhaler Inhale 2 puffs into the lungs every 4 (four) hours as needed for wheezing or shortness of breath. 3 each 2   allopurinol (ZYLOPRIM) 100 MG tablet TAKE 1 TABLET DAILY 90 tablet 3   ALPRAZolam (XANAX) 0.5 MG tablet Take 1 tablet (0.5 mg total) by mouth daily as needed for anxiety. 5 tablet  0   amLODipine (NORVASC) 10 MG tablet TAKE 1 TABLET DAILY (Patient taking differently: Take 10 mg by mouth every evening.) 90 tablet 3   capecitabine (XELODA) 500 MG tablet TAKE 3 TABLETS BY MOUTH TWICE DAILY FOR 14 DAYS EVERY 4 WEEKS. 84 tablet 2   Cholecalciferol (VITAMIN D3) 25 MCG (1000 UT) CAPS Take 1,000 Units by mouth daily.     Continuous Blood Gluc Receiver (DEXCOM G7 RECEIVER) DEVI Please provide 1 receiver 1 each 0   Continuous Blood Gluc Sensor (DEXCOM G7 SENSOR) MISC 1 Box by Does not apply route every 30 (thirty) days. 3 each 3   docusate sodium (COLACE) 100 MG capsule Take  300 mg by mouth daily.     feeding supplement, GLUCERNA SHAKE, (GLUCERNA SHAKE) LIQD Take 237 mLs by mouth 2 (two) times daily as needed (when not eating).     Ferrous Sulfate (IRON) 325 (65 Fe) MG TABS Take 325 mg by mouth See admin instructions. Take 325 mg by mouth every other night     GAVISCON EXTRA STRENGTH 160-105 MG CHEW Chew 1 tablet by mouth every 6 (six) hours as needed (for gas).     glipiZIDE (GLUCOTROL) 5 MG tablet TAKE 1 TABLET DAILY BEFORE BREAKFAST (Patient taking differently: Take 5 mg by mouth daily before breakfast.) 90 tablet 3   glucose blood test strip Use to test blood sugar twice a day 200 each 3   guaiFENesin (MUCINEX) 600 MG 12 hr tablet Take 600 mg by mouth 2 (two) times daily.     ketotifen (ZADITOR) 0.025 % ophthalmic solution Place 1 drop into both eyes 2 (two) times daily as needed (allergies).     magnesium hydroxide (MILK OF MAGNESIA) 400 MG/5ML suspension Take 30 mLs by mouth daily as needed (constipation).     metoprolol tartrate (LOPRESSOR) 25 MG tablet TAKE 1 TABLET TWICE A DAY (Patient taking differently: Take 25 mg by mouth 2 (two) times daily.) 180 tablet 3   ondansetron (ZOFRAN) 8 MG tablet TAKE 1 TABLET BY MOUTH EVERY 8 HOURS AS NEEDED FOR NAUSEA AND/OR VOMITING 30 tablet 0   OXYGEN Inhale 2 L/min into the lungs as needed (for shortness of breath).     pantoprazole (PROTONIX) 40 MG tablet TAKE 1 TABLET TWICE A DAY 180 tablet 3   polyethylene glycol (MIRALAX / GLYCOLAX) 17 g packet Take 17 g by mouth See admin instructions. Mix 17 grams into the suggested amount of water and drink once a day- HOLD FOR DIARRHEA     simethicone (MYLICON) 125 MG chewable tablet Chew 125 mg by mouth every 6 (six) hours as needed for flatulence.     temozolomide (TEMODAR) 100 MG capsule Take 4 capsules by mouth daily on day 10, 11, 12, 13, and 14 every 4 weeks. Take on an empty stomach or at bedtime to decrease nausea or vomiting 20 capsule 3   Tiotropium Bromide-Olodaterol  (STIOLTO RESPIMAT) 2.5-2.5 MCG/ACT AERS Inhale 2 puffs into the lungs daily. 4 g 11   vitamin B-12 (CYANOCOBALAMIN) 1000 MCG tablet Take 1,000 mcg by mouth daily.      vitamin C (ASCORBIC ACID) 500 MG tablet Take 500 mg by mouth daily.     desvenlafaxine (PRISTIQ) 25 MG 24 hr tablet Take 1 tablet (25 mg total) by mouth daily. 90 tablet 3   No current facility-administered medications for this visit.     Objective:  BP 110/64   Pulse 74   Temp (!) 97.2 F (36.2 C)  Wt 158 lb 12.8 oz (72 kg)   SpO2 98%   BMI 19.85 kg/m  Gen: NAD, resting comfortably CV: RRR no murmurs rubs or gallops Lungs: CTAB no crackles, wheeze, rhonchi Ext: no edema Skin: warm, dry Neuro: Has cane today but walks to the bathroom without it and appears reasonably stable    Assessment and Plan   # HCAP S: Patient was hospitalized from June 1 to June 8 with complaints of cough and shortness of breath and x-ray showed right lower lobe infiltrate.  CTA of the chest showed right-sided pleural effusion as well.  He was admitted to the hospital service and treated with antibiotics.  He required a thoracentesis yielding 550 cc of hazy yellow fluid consistent with exudate.  CT of the chest repeated after thoracentesis and a moderate right pleural effusion remained despite drainage.  Patient required ICU stay.  He had a chest tube placement on 01/23/2023 and chest tube was eventually removed on 01/25/2023.  He was discharged on 23 more days of Augmentin for 4 weeks of total treatment after being treated with Unasyn in the hospital - Did have acute metabolic encephalopathy that is multifactorial-had been on Precedex but weaned off - Required Foley catheter but that was also removed - Had some urinary retention causing pain but later tolerated voiding trial as above - Chronically on 2 L of oxygen at home and was weaned back to this at baseline inhalers - Noted for his stage IV neuroendocrine carcinoma that chemotherapy has been  on hold and remains on hold  Noted anxiety in the hospital and required 0.5 mg Ativan at times  He was discharged home but then on June 10 returned to the emergency department-CT was unchanged and he was discharged to a nursing home has care needs at home or to great  He was seen by Dr. Sallee Lange on 03/07/2023 for hospital follow-up.  They repeated chest x-ray-he was continued on Stiolto on an outpatient basis.  They were also try to get him a portable oxygen concentrator.  X-ray showed small right pleural effusion as well as right basilar bandlike opacities likely atelectasis  2 weeks of rehab at [facility] and Has been doing home health since getting back home  He seems to be recovering slowly.  Wife does report he seems to be More argumentative, impatient since being home. Tried to swat at nurses in the hospital.  Apparently palliative was consulted but does not have an ongoing relationship.  With the staff spells seems to have some lapses in his memory as well  A/P: Thankfully he has recovered from the substantial pneumonia despite being cancer patient and having COPD but I do still think this has been a setback for him-glad he got the additional help in the rehab facility to get his strength back  # Depression S: Medication: None -sleeps in day, appetite ok- down 14 lbs    03/22/2023    3:53 PM 11/27/2022    7:57 AM 11/23/2022    9:44 AM  Depression screen PHQ 2/9  Decreased Interest 1 2 0  Down, Depressed, Hopeless 2 3 0  PHQ - 2 Score 3 5 0  Altered sleeping 2 3   Tired, decreased energy 3 3   Change in appetite 1 3   Feeling bad or failure about yourself  1 0   Trouble concentrating 3    Moving slowly or fidgety/restless 2 2   Suicidal thoughts 0 0   PHQ-9 Score 15 16  Difficult doing work/chores Very difficult Somewhat difficult    A/P: His illness is longer some stressors such as losing some friends and families in recent years has been very challenging for him.  He is agreeable to  trial Pristiq.  Chart has been marked as at risk for QT prolongation so we did not try Remeron or other SSRIs-instead went with Lesly Dukes is hoping this will get covered at the [place] through [facility] -Still has some sparing Ativan for anxiety which could be used if anxiety worsens  #Chronic kidney disease stage III S: Patient with significant worsening in kidney function dating back to November 2020 when patient was on Afinitor previously- later went back on this but did not worsen as much (did raise CBGs though and determined not effective so switched). Follows with [facility] A/P: Thankfully GFR most recently at 57-seemed to actually do well in the hospital probably with increased hydration-update CMP  #Diabetes mellitus S: No Rx as of 06/23/2019 then later restarted glipizde  now 5 mg in AM A/P: Previously well-controlled-update A1c with labs today-suspect stable  #Hypertension S: Compliant with amlodipine 10 mg and metoprolol 25 mg twice daily A/P: Well-controlled in office today-continue current medication  #hyperlipidemia S: Medication: None Lab Results  Component Value Date   CHOL 198 09/21/2020   HDL 28.90 (L) 09/21/2020   LDLCALC 132 (H) 09/21/2020   LDLDIRECT 122.0 05/12/2019   TRIG 183.0 (H) 09/21/2020   CHOLHDL 7 09/21/2020   A/P: With his overall health and debility may try to remain off statin despite the diabetes but it has been sometime since we have updated this and since we are updating labs today week decided to proceed forward    Recommended follow up: Return in about 6 months (around 09/22/2023) for physical or sooner if needed.Schedule b4 you leave. Future Appointments  Date Time Provider Department Center  04/09/2023 10:00 AM [facility] None  04/09/2023 11:00 AM [facility] 2 [facility] [place]  04/09/2023 11:30 AM [facility] 2 [facility] [place]  04/11/2023  1:45 PM Si Gaul, MD [facility] None  05/15/2023  2:45 PM Delton Coombes, Les Pou, MD  [facility] None  06/06/2023  2:15 PM Freddie Breech, DPM [facility]  11/29/2023  9:15 AM [facility] ANNUAL WELLNESS VISIT 1 [facility]   Lab/Order associations:   ICD-10-CM   1. Type 2 diabetes mellitus with stage 3b chronic kidney disease, without long-term current use of insulin (HCC)  E11.22 Urine Microalbumin w/creat. ratio   N18.32 Comprehensive metabolic panel    CBC with Differential/Platelet    Hemoglobin A1c    Lipid panel    2. HCAP (healthcare-associated pneumonia)  J18.9     3. Recurrent major depressive disorder, in full remission (HCC) Chronic F33.42     4. Chronic kidney disease (CKD) stage G3b/A1, moderately decreased glomerular filtration rate (GFR) between 30-44 mL/min/1.73 square meter and albuminuria creatinine ratio less than 30 mg/g (HCC)  N18.32     5. Essential hypertension  I10     6. Moderate episode of recurrent major depressive disorder (HCC)  F33.1     7. Hyperlipidemia, unspecified hyperlipidemia type  E78.5       Meds ordered this encounter  Medications   DISCONTD: desvenlafaxine (PRISTIQ) 25 MG 24 hr tablet    Sig: Take 1 tablet (25 mg total) by mouth daily.    Dispense:  30 tablet    Refill:  5   desvenlafaxine (PRISTIQ) 25 MG 24 hr tablet    Sig:  Take 1 tablet (25 mg total) by mouth daily.    Dispense:  90 tablet    Refill:  3    Return precautions advised.  Tana Conch, MD

## 2023-03-22 NOTE — Patient Instructions (Addendum)
Get diabetic eye exam  Let us know if you get your [facility] ,COVID or Flu shot this fall at the pharmacy.  With down mood - lets try Pristiq 25 mg each morning to see if that helps  Taking the medicine as directed and not missing any doses is one of the best things you can do to treat your depression.  Here are some things to keep in mind:  Side effects (stomach upset, some increased anxiety) may happen before you notice a benefit.  These side effects typically go away over time. Changes to your dose of medicine or a change in medication all together is sometimes necessary Most people need to be on medication at least 6-12 months Many people will notice an improvement within two weeks but the full effect of the medication can take up to 6 weeks Stopping the medication when you start feeling better often results in a return of symptoms If you start having thoughts of hurting yourself or others after starting this medicine, call our office immediately at [phone] or seek care through [phone] or [phone]  Bring back urine tomorrow  Recommended follow up: Return in about 6 months (around 09/22/2023) for physical or sooner if needed.Schedule b4 you leave.

## 2023-03-23 ENCOUNTER — Telehealth: Payer: Self-pay | Admitting: Family Medicine

## 2023-03-23 DIAGNOSIS — F32A Depression, unspecified: Secondary | ICD-10-CM | POA: Diagnosis not present

## 2023-03-23 DIAGNOSIS — E1122 Type 2 diabetes mellitus with diabetic chronic kidney disease: Secondary | ICD-10-CM | POA: Diagnosis not present

## 2023-03-23 DIAGNOSIS — N1832 Chronic kidney disease, stage 3b: Secondary | ICD-10-CM | POA: Diagnosis not present

## 2023-03-23 DIAGNOSIS — C7B02 Secondary carcinoid tumors of liver: Secondary | ICD-10-CM | POA: Diagnosis not present

## 2023-03-23 DIAGNOSIS — J439 Emphysema, unspecified: Secondary | ICD-10-CM | POA: Diagnosis not present

## 2023-03-23 DIAGNOSIS — C7A090 Malignant carcinoid tumor of the bronchus and lung: Secondary | ICD-10-CM | POA: Diagnosis not present

## 2023-03-23 LAB — MICROALBUMIN / CREATININE URINE RATIO
Creatinine,U: 75.1 mg/dL
Microalb Creat Ratio: 0.9 mg/g (ref 0.0–30.0)
Microalb, Ur: <0.7 mg/dL (ref 0.0–1.9)

## 2023-03-23 NOTE — Addendum Note (Signed)
Addended by: Lorn Junes on: 03/23/2023 08:50 AM   Modules accepted: Orders

## 2023-03-23 NOTE — Telephone Encounter (Signed)
Called and spoke with Liji and vo given.

## 2023-03-23 NOTE — Telephone Encounter (Signed)
Home Health Verbal Orders  Agency: [facility]   Caller: Liji  Contact and title Ph# [phone] / PT   Requesting Extending PT:    Reason for Request:  Patient can benefit for more improvement for imbalance, gait, safety with transfer  Frequency:  1 x per week for 3 weeks  ]States can leave detailed message on voice mail if unable to answer call  HH needs F2F w/in last 30 days

## 2023-03-26 DIAGNOSIS — N1832 Chronic kidney disease, stage 3b: Secondary | ICD-10-CM | POA: Diagnosis not present

## 2023-03-26 DIAGNOSIS — J439 Emphysema, unspecified: Secondary | ICD-10-CM | POA: Diagnosis not present

## 2023-03-26 DIAGNOSIS — F32A Depression, unspecified: Secondary | ICD-10-CM | POA: Diagnosis not present

## 2023-03-26 DIAGNOSIS — C7A090 Malignant carcinoid tumor of the bronchus and lung: Secondary | ICD-10-CM | POA: Diagnosis not present

## 2023-03-26 DIAGNOSIS — C7B02 Secondary carcinoid tumors of liver: Secondary | ICD-10-CM | POA: Diagnosis not present

## 2023-03-26 DIAGNOSIS — E1122 Type 2 diabetes mellitus with diabetic chronic kidney disease: Secondary | ICD-10-CM | POA: Diagnosis not present

## 2023-04-06 DIAGNOSIS — N1832 Chronic kidney disease, stage 3b: Secondary | ICD-10-CM | POA: Diagnosis not present

## 2023-04-06 DIAGNOSIS — F32A Depression, unspecified: Secondary | ICD-10-CM | POA: Diagnosis not present

## 2023-04-06 DIAGNOSIS — C7B02 Secondary carcinoid tumors of liver: Secondary | ICD-10-CM | POA: Diagnosis not present

## 2023-04-06 DIAGNOSIS — E1122 Type 2 diabetes mellitus with diabetic chronic kidney disease: Secondary | ICD-10-CM | POA: Diagnosis not present

## 2023-04-06 DIAGNOSIS — J439 Emphysema, unspecified: Secondary | ICD-10-CM | POA: Diagnosis not present

## 2023-04-06 DIAGNOSIS — C7A090 Malignant carcinoid tumor of the bronchus and lung: Secondary | ICD-10-CM | POA: Diagnosis not present

## 2023-04-09 ENCOUNTER — Ambulatory Visit (HOSPITAL_COMMUNITY): Admission: RE | Admit: 2023-04-09 | Payer: Medicare Other | Source: Ambulatory Visit

## 2023-04-09 ENCOUNTER — Ambulatory Visit (HOSPITAL_COMMUNITY)
Admission: RE | Admit: 2023-04-09 | Discharge: 2023-04-09 | Disposition: A | Discharge status: Home / Self Care | Payer: Medicare Other | Source: Ambulatory Visit | Attending: Internal Medicine | Admitting: Internal Medicine

## 2023-04-09 ENCOUNTER — Inpatient Hospital Stay: Payer: Medicare Other | Attending: Internal Medicine

## 2023-04-09 ENCOUNTER — Other Ambulatory Visit: Payer: Self-pay | Admitting: Internal Medicine

## 2023-04-09 DIAGNOSIS — C7B8 Other secondary neuroendocrine tumors: Secondary | ICD-10-CM | POA: Insufficient documentation

## 2023-04-09 DIAGNOSIS — C7A090 Malignant carcinoid tumor of the bronchus and lung: Secondary | ICD-10-CM | POA: Insufficient documentation

## 2023-04-09 DIAGNOSIS — I700 Atherosclerosis of aorta: Secondary | ICD-10-CM | POA: Diagnosis not present

## 2023-04-09 DIAGNOSIS — C7B02 Secondary carcinoid tumors of liver: Secondary | ICD-10-CM | POA: Insufficient documentation

## 2023-04-09 DIAGNOSIS — C7A8 Other malignant neuroendocrine tumors: Secondary | ICD-10-CM | POA: Insufficient documentation

## 2023-04-09 DIAGNOSIS — R519 Headache, unspecified: Secondary | ICD-10-CM | POA: Insufficient documentation

## 2023-04-09 DIAGNOSIS — J439 Emphysema, unspecified: Secondary | ICD-10-CM | POA: Diagnosis not present

## 2023-04-09 DIAGNOSIS — C7B03 Secondary carcinoid tumors of bone: Secondary | ICD-10-CM | POA: Insufficient documentation

## 2023-04-09 LAB — CBC WITH DIFFERENTIAL (CANCER CENTER ONLY)
Abs Immature Granulocytes: 0.03 10*3/uL (ref 0.00–0.07)
Basophils Absolute: 0.1 10*3/uL (ref 0.0–0.1)
Basophils Relative: 2 %
Eosinophils Absolute: 0.1 10*3/uL (ref 0.0–0.5)
Eosinophils Relative: 2 %
HCT: 36.2 % — ABNORMAL LOW (ref 39.0–52.0)
Hemoglobin: 11.9 g/dL — ABNORMAL LOW (ref 13.0–17.0)
Immature Granulocytes: 1 %
Lymphocytes Relative: 21 %
Lymphs Abs: 1 10*3/uL (ref 0.7–4.0)
MCH: 29.5 pg (ref 26.0–34.0)
MCHC: 32.9 g/dL (ref 30.0–36.0)
MCV: 89.8 fL (ref 80.0–100.0)
Monocytes Absolute: 0.3 10*3/uL (ref 0.1–1.0)
Monocytes Relative: 7 %
Neutro Abs: 3.3 10*3/uL (ref 1.7–7.7)
Neutrophils Relative %: 67 %
Platelet Count: 164 10*3/uL (ref 150–400)
RBC: 4.03 MIL/uL — ABNORMAL LOW (ref 4.22–5.81)
RDW: 17.5 % — ABNORMAL HIGH (ref 11.5–15.5)
WBC Count: 4.8 10*3/uL (ref 4.0–10.5)
nRBC: 0 % (ref 0.0–0.2)

## 2023-04-09 LAB — CMP (CANCER CENTER ONLY)
ALT: 7 U/L (ref 0–44)
AST: 10 U/L — ABNORMAL LOW (ref 15–41)
Albumin: 3.8 g/dL (ref 3.5–5.0)
Alkaline Phosphatase: 70 U/L (ref 38–126)
Anion gap: 7 (ref 5–15)
BUN: 20 mg/dL (ref 8–23)
CO2: 27 mmol/L (ref 22–32)
Calcium: 9.3 mg/dL (ref 8.9–10.3)
Chloride: 104 mmol/L (ref 98–111)
Creatinine: 1.44 mg/dL — ABNORMAL HIGH (ref 0.61–1.24)
GFR, Estimated: 49 mL/min — ABNORMAL LOW (ref >60)
Glucose, Bld: 140 mg/dL — ABNORMAL HIGH (ref 70–99)
Potassium: 3.8 mmol/L (ref 3.5–5.1)
Sodium: 138 mmol/L (ref 135–145)
Total Bilirubin: 0.4 mg/dL (ref 0.3–1.2)
Total Protein: 8.3 g/dL — ABNORMAL HIGH (ref 6.5–8.1)

## 2023-04-11 ENCOUNTER — Inpatient Hospital Stay: Payer: Medicare Other | Admitting: Internal Medicine

## 2023-04-11 VITALS — BP 138/85 | HR 76 | Temp 97.4°F | Resp 17 | Ht 75.0 in | Wt 162.0 lb

## 2023-04-11 DIAGNOSIS — C7B02 Secondary carcinoid tumors of liver: Secondary | ICD-10-CM | POA: Diagnosis not present

## 2023-04-11 DIAGNOSIS — C7B03 Secondary carcinoid tumors of bone: Secondary | ICD-10-CM | POA: Diagnosis not present

## 2023-04-11 DIAGNOSIS — C7A8 Other malignant neuroendocrine tumors: Secondary | ICD-10-CM

## 2023-04-11 DIAGNOSIS — R519 Headache, unspecified: Secondary | ICD-10-CM | POA: Diagnosis not present

## 2023-04-11 DIAGNOSIS — C7A090 Malignant carcinoid tumor of the bronchus and lung: Secondary | ICD-10-CM | POA: Diagnosis not present
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
## 2023-04-12 DIAGNOSIS — E1122 Type 2 diabetes mellitus with diabetic chronic kidney disease: Secondary | ICD-10-CM | POA: Diagnosis not present

## 2023-04-12 DIAGNOSIS — C7B02 Secondary carcinoid tumors of liver: Secondary | ICD-10-CM | POA: Diagnosis not present

## 2023-04-12 DIAGNOSIS — F32A Depression, unspecified: Secondary | ICD-10-CM | POA: Diagnosis not present

## 2023-04-12 DIAGNOSIS — J439 Emphysema, unspecified: Secondary | ICD-10-CM | POA: Diagnosis not present

## 2023-04-12 DIAGNOSIS — N1832 Chronic kidney disease, stage 3b: Secondary | ICD-10-CM | POA: Diagnosis not present

## 2023-04-12 DIAGNOSIS — C7A090 Malignant carcinoid tumor of the bronchus and lung: Secondary | ICD-10-CM | POA: Diagnosis not present

## 2023-04-18 ENCOUNTER — Ambulatory Visit (HOSPITAL_COMMUNITY)
Admission: RE | Admit: 2023-04-18 | Discharge: 2023-04-18 | Disposition: A | Discharge status: Home / Self Care | Payer: Medicare Other | Source: Ambulatory Visit | Attending: Internal Medicine | Admitting: Internal Medicine

## 2023-04-18 DIAGNOSIS — I6782 Cerebral ischemia: Secondary | ICD-10-CM | POA: Diagnosis not present

## 2023-04-18 DIAGNOSIS — G459 Transient cerebral ischemic attack, unspecified: Secondary | ICD-10-CM | POA: Diagnosis not present

## 2023-04-18 DIAGNOSIS — C7A8 Other malignant neuroendocrine tumors: Secondary | ICD-10-CM | POA: Diagnosis not present

## 2023-04-18 DIAGNOSIS — G319 Degenerative disease of nervous system, unspecified: Secondary | ICD-10-CM | POA: Diagnosis not present

## 2023-04-18 MED ORDER — GADOBUTROL 1 MMOL/ML IV SOLN
7.5000 mL | Freq: Once | INTRAVENOUS | Status: AC | PRN
  Administered 2023-04-18: 7.5 mL via INTRAVENOUS

## 2023-05-08 ENCOUNTER — Ambulatory Visit (INDEPENDENT_AMBULATORY_CARE_PROVIDER_SITE_OTHER): Payer: Medicare Other

## 2023-05-08 DIAGNOSIS — Z23 Encounter for immunization: Secondary | ICD-10-CM | POA: Diagnosis not present

## 2023-05-15 ENCOUNTER — Ambulatory Visit: Payer: Medicare Other | Admitting: Emergency Medicine

## 2023-05-15 ENCOUNTER — Encounter: Payer: Self-pay | Admitting: Emergency Medicine

## 2023-05-15 VITALS — BP 140/74 | HR 73 | Ht 75.0 in | Wt 168.4 lb

## 2023-05-15 DIAGNOSIS — J449 Chronic obstructive pulmonary disease, unspecified: Secondary | ICD-10-CM

## 2023-05-15 DIAGNOSIS — C3431 Malignant neoplasm of lower lobe, right bronchus or lung: Secondary | ICD-10-CM | POA: Diagnosis not present

## 2023-05-15 NOTE — Progress Notes (Signed)
Subjective:    Patient ID: Richard Navarro, male    DOB: [birth year], 77 y.o.   MRN: [id number]  HPI  ROV 10/19/22 --Richard Navarro is 77 and has a history of stage IV neuroendocrine cell lung cancer with lung involvement, liver involvement.  Also with COPD and hypoxemic respiratory failure on 2 L/min.  He needs a portable oxygen concentrator, has been having trouble getting this through his PCP due to technicalities with his DME.  Followed by Dr. Arbutus Ped and currently undergoing systemic Xeloda, Temodar. I tried starting him on Stiolto back in November 2022, he reports that he didn't continue it but cannot remember why he stopped it.  He is currently on Spiriva Respimat.  He notes an period of increased cough about 3 weeks ago, has had sneezing as well, nasal congestion and drainage. Having more exertional SOB.   CT chest abdomen and pelvis done 08/31/2022 reviewed by me shows slight decrease in size of a medial right lower lobe soft tissue mass, slight increase in adjacent tiny lymph node.  There is associated occlusion of the right lower lobe bronchus.  Prominent subcarinal lymphadenopathy that is unchanged.  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  Has been followed by Dr. Arbutus Ped for this, chemotherapy held in March 2024.  Unfortunately hospitalized in early June with a right postobstructive pneumonia and associated parapneumonic effusion.  He had a chest tube and required lytics.  It was removed on 01/25/2023.  He is scheduled to undergo a repeat CT scan of his chest abdomen and pelvis on 04/09/2023. He reports low energy - taking him a bit longer to recover, but he does feel that his cough is better. He has some intermittent R chest discomfort, having difficulty getting a deep breath in.   CT chest  01/29/2023 reviewed by me showed markedly severe paraseptal and centrilobular emphysema, stable posterior right lower lobe and mild posterior left lower lobe airspace disease, persistent right lower lobe mass, small stable right pleural effusion   ROV 05/15/2023 --follow-up visit for 77 year old man with a history of stage IV neuroendocrine cell cancer with lung and liver involvement followed by Dr. Arbutus Ped.  I have seen him for this as well as for COPD with associated hypoxemic respiratory failure.  He was hospitalized in July for a postobstructive pneumonia, parapneumonic effusion, chest tube and lytics. He reports that he is slowly regaining strength. He has kept imbalance, no falls since I saw him last.  He is currently off chemo, follows w Dr Arbutus Ped He is on Stiolto, rare albuterol use.   CT abd / pelvis / chest 04/09/23 reviewed by me shows  Review of Systems As per HPI     Objective:   Physical Exam   Vitals:   05/15/23 1445  BP: (!) 140/74  Pulse: 73  SpO2: 93%  Weight: 168 lb 6.4 oz (76.4 kg)  Height: 6\' 3"  (1.905 m)   Gen: Pleasant, thin, in no distress,  normal affect  ENT: Small ulceration left posterior pharynx. Mouth clear,  oropharynx clear, no postnasal drip  Neck: No JVD, no stridor  Lungs: No use of accessory muscles, decreased breath sounds on the right, clear on the left  Cardiovascular: RRR, heart sounds normal, no murmur or gallops, no peripheral edema  Musculoskeletal: No deformities, no cyanosis or clubbing  Neuro: alert, non focal  Skin: Warm, no lesions or rash     Assessment & Plan:  COPD (chronic obstructive pulmonary disease) (HCC) Please continue your Stiolto 2 puffs once daily. Keep your albuterol available to use 2 puffs when needed for shortness of breath, chest tightness, wheezing.   Follow with Dr Delton Coombes in 6 months or sooner if you have any problems  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 [facility]) Continue to follow with Dr.  Arbutus Ped and get your serial CT scans performed as per his recommendations    Levy Pupa, MD, PhD 05/15/2023, 3:12 PM [facility] [phone] or if no answer [phone]

## 2023-05-15 NOTE — Assessment & Plan Note (Addendum)
Please continue your Stiolto 2 puffs once daily. Keep your albuterol available to use 2 puffs when needed for shortness of breath, chest tightness, wheezing.   Follow with Dr Delton Coombes in 6 months or sooner if you have any problems

## 2023-05-15 NOTE — Assessment & Plan Note (Signed)
Continue to follow with Dr. Arbutus Ped and get your serial CT scans performed as per his recommendations

## 2023-05-15 NOTE — Patient Instructions (Signed)
Please continue your Stiolto 2 puffs once daily. Keep your albuterol available to use 2 puffs when needed for shortness of breath, chest tightness, wheezing. Continue to follow with Dr. Arbutus Ped and get your serial CT scans performed as per his recommendations Follow with Dr Delton Coombes in 6 months or sooner if you have any problems

## 2023-06-06 ENCOUNTER — Encounter: Payer: Self-pay | Admitting: Podiatry

## 2023-06-06 ENCOUNTER — Ambulatory Visit (INDEPENDENT_AMBULATORY_CARE_PROVIDER_SITE_OTHER): Payer: Medicare Other | Admitting: Podiatry

## 2023-06-06 DIAGNOSIS — B351 Tinea unguium: Secondary | ICD-10-CM | POA: Diagnosis not present

## 2023-06-06 DIAGNOSIS — M79674 Pain in right toe(s): Secondary | ICD-10-CM | POA: Diagnosis not present

## 2023-06-06 DIAGNOSIS — M79675 Pain in left toe(s): Secondary | ICD-10-CM

## 2023-06-06 DIAGNOSIS — E1151 Type 2 diabetes mellitus with diabetic peripheral angiopathy without gangrene: Secondary | ICD-10-CM

## 2023-06-12 NOTE — Progress Notes (Signed)
Subjective:  Patient ID: Richard Navarro, male    DOB: [birth year],  MRN: [id number]  77 y.o. male presents to clinic with  at risk foot care. Pt has h/o NIDDM with PAD. He is alone on today's visit. Chief Complaint  Patient presents with   Diabetes    [facility]- BS-pt doesn't monitor blood sugars  A1C 6.7 03/2024 PCP- Next appt 11/25 Patient has concerns about flaky skin on legs and feet would like recommendation on how to treat.    New problem(s): None   PCP is Shelva Majestic, MD.  Allergies  Allergen Reactions   Afinitor [Everolimus] Swelling and Other (See Comments)    Angioedema- Patient was on this and Lisinopril at the same time; had no trouble breathing, but throat became swollen   Lisinopril Swelling and Other (See Comments)    Angioedema- on this and afinitor same time; had no trouble breathing, but throat became swollen   Other Other (See Comments)    Certain seafoods cause gout flares   Statins Other (See Comments)    Joint pain   Simvastatin Other (See Comments)    Joint pain    Review of Systems: Negative except as noted in the HPI.   Objective:  Richard Navarro is a pleasant 77 y.o. male thin build in NAD. AAO x 3.  Vascular Examination: CFT <3 seconds b/l. DP pulses faintly palpable b/l. PT pulses nonpalpable b/l. Digital hair absent. Skin temperature gradient warm to warm b/l. No pain with calf compression. No ischemia or gangrene. No cyanosis or clubbing noted b/l.    Neurological Examination: Sensation grossly intact b/l with 10 gram monofilament. Vibratory sensation intact b/l.   Dermatological Examination: Pedal skin thin, shiny and atrophic b/l LE. No open wounds b/l LE. No interdigital macerations noted b/l LE. Toenails 1-5 b/l elongated, discolored, dystrophic, thickened, crumbly with subungual debris and tenderness to dorsal palpation. No corns, calluses nor porokeratotic lesions noted.  Musculoskeletal Examination: Muscle strength 4/5 to all  lower extremity muscle groups bilaterally. No pain, crepitus or joint limitation noted with ROM bilateral LE. Clawtoe deformity 2-5 bilaterally.  Radiographs: None  Last HgA1c:      Latest Ref Rng & Units 03/22/2023    4:40 PM 09/22/2022   10:24 AM  Hemoglobin A1C  Hemoglobin-A1c 4.6 - 6.5 % 6.7  6.1    Last A1c:      Latest Ref Rng & Units 03/22/2023    4:40 PM 09/22/2022   10:24 AM  Hemoglobin A1C  Hemoglobin-A1c 4.6 - 6.5 % 6.7  6.1      Assessment:   1. Pain due to onychomycosis of toenails of both feet   2. Type II diabetes mellitus with peripheral circulatory disorder [facility])    Plan:  -Consent given for treatment as described below: -Examined patient. -Continue foot and shoe inspections daily. Monitor blood glucose per PCP/Endocrinologist's recommendations. -Mycotic toenails 1-5 bilaterally were debrided in length and girth with sterile nail nippers and dremel without incident. -Patient/POA to call should there be question/concern in the interim.  Return in about 3 months (around 09/06/2023).  Freddie Breech, DPM

## 2023-06-19 DIAGNOSIS — N184 Chronic kidney disease, stage 4 (severe): Secondary | ICD-10-CM | POA: Diagnosis not present

## 2023-06-19 DIAGNOSIS — I129 Hypertensive chronic kidney disease with stage 1 through stage 4 chronic kidney disease, or unspecified chronic kidney disease: Secondary | ICD-10-CM | POA: Diagnosis not present

## 2023-06-19 DIAGNOSIS — D631 Anemia in chronic kidney disease: Secondary | ICD-10-CM | POA: Diagnosis not present

## 2023-06-19 DIAGNOSIS — C7A8 Other malignant neuroendocrine tumors: Secondary | ICD-10-CM | POA: Diagnosis not present

## 2023-06-25 ENCOUNTER — Other Ambulatory Visit (HOSPITAL_BASED_OUTPATIENT_CLINIC_OR_DEPARTMENT_OTHER): Payer: Self-pay

## 2023-06-25 ENCOUNTER — Telehealth: Payer: Self-pay | Admitting: Family Medicine

## 2023-06-25 NOTE — Telephone Encounter (Signed)
Pt's partner is asking if patient is eligible to get Shingrix, pneumonia, & RSV vaccines. Please advise

## 2023-06-26 NOTE — Telephone Encounter (Signed)
Called and spoke with pt spouse and below message given.

## 2023-06-26 NOTE — Telephone Encounter (Signed)
It appears only had Prevnar 13-Prevnar 20 would be helpful given his overall lung condition He had Zostavax in 2011 but not Shingrix-would recommend Shingrix RSV also reasonable given his lung condition

## 2023-06-26 NOTE — Telephone Encounter (Signed)
See below, Pt has had Prevnar and Pneumovax, looks like a SHINGRIX in 2011 but no RSV on file.

## 2023-06-28 ENCOUNTER — Other Ambulatory Visit (HOSPITAL_BASED_OUTPATIENT_CLINIC_OR_DEPARTMENT_OTHER): Payer: Self-pay

## 2023-06-28 DIAGNOSIS — Z23 Encounter for immunization: Secondary | ICD-10-CM | POA: Diagnosis not present

## 2023-06-28 MED ORDER — COVID-19 MRNA VAC-TRIS(PFIZER) 30 MCG/0.3ML IM SUSY
0.3000 mL | PREFILLED_SYRINGE | Freq: Once | INTRAMUSCULAR | 0 refills | Status: AC
  Filled 2023-06-28: qty 0.3, 1d supply, fill #0

## 2023-07-04 ENCOUNTER — Inpatient Hospital Stay: Payer: Medicare Other | Attending: Internal Medicine

## 2023-07-04 ENCOUNTER — Ambulatory Visit (HOSPITAL_COMMUNITY)
Admission: RE | Admit: 2023-07-04 | Discharge: 2023-07-04 | Disposition: A | Discharge status: Home / Self Care | Payer: Medicare Other | Source: Ambulatory Visit | Attending: Internal Medicine | Admitting: Internal Medicine

## 2023-07-04 ENCOUNTER — Other Ambulatory Visit: Payer: Self-pay | Admitting: Internal Medicine

## 2023-07-04 DIAGNOSIS — C7A090 Malignant carcinoid tumor of the bronchus and lung: Secondary | ICD-10-CM | POA: Insufficient documentation

## 2023-07-04 DIAGNOSIS — C7A8 Other malignant neuroendocrine tumors: Secondary | ICD-10-CM

## 2023-07-04 DIAGNOSIS — J432 Centrilobular emphysema: Secondary | ICD-10-CM | POA: Diagnosis not present

## 2023-07-04 DIAGNOSIS — K4090 Unilateral inguinal hernia, without obstruction or gangrene, not specified as recurrent: Secondary | ICD-10-CM | POA: Diagnosis not present

## 2023-07-04 DIAGNOSIS — I700 Atherosclerosis of aorta: Secondary | ICD-10-CM | POA: Diagnosis not present

## 2023-07-04 DIAGNOSIS — Z79630 Long term (current) use of alkylating agent: Secondary | ICD-10-CM | POA: Insufficient documentation

## 2023-07-04 DIAGNOSIS — C7B03 Secondary carcinoid tumors of bone: Secondary | ICD-10-CM | POA: Insufficient documentation

## 2023-07-04 DIAGNOSIS — C7B02 Secondary carcinoid tumors of liver: Secondary | ICD-10-CM | POA: Insufficient documentation

## 2023-07-04 DIAGNOSIS — M25561 Pain in right knee: Secondary | ICD-10-CM | POA: Insufficient documentation

## 2023-07-04 DIAGNOSIS — Z79899 Other long term (current) drug therapy: Secondary | ICD-10-CM | POA: Insufficient documentation

## 2023-07-04 DIAGNOSIS — R519 Headache, unspecified: Secondary | ICD-10-CM | POA: Insufficient documentation

## 2023-07-04 DIAGNOSIS — R42 Dizziness and giddiness: Secondary | ICD-10-CM | POA: Insufficient documentation

## 2023-07-04 LAB — CBC WITH DIFFERENTIAL (CANCER CENTER ONLY)
Abs Immature Granulocytes: 0.02 10*3/uL (ref 0.00–0.07)
Basophils Absolute: 0.1 10*3/uL (ref 0.0–0.1)
Basophils Relative: 2 %
Eosinophils Absolute: 0.1 10*3/uL (ref 0.0–0.5)
Eosinophils Relative: 2 %
HCT: 39.1 % (ref 39.0–52.0)
Hemoglobin: 13 g/dL (ref 13.0–17.0)
Immature Granulocytes: 0 %
Lymphocytes Relative: 21 %
Lymphs Abs: 1 10*3/uL (ref 0.7–4.0)
MCH: 29.3 pg (ref 26.0–34.0)
MCHC: 33.2 g/dL (ref 30.0–36.0)
MCV: 88.3 fL (ref 80.0–100.0)
Monocytes Absolute: 0.3 10*3/uL (ref 0.1–1.0)
Monocytes Relative: 6 %
Neutro Abs: 3.5 10*3/uL (ref 1.7–7.7)
Neutrophils Relative %: 69 %
Platelet Count: 172 10*3/uL (ref 150–400)
RBC: 4.43 MIL/uL (ref 4.22–5.81)
RDW: 15.7 % — ABNORMAL HIGH (ref 11.5–15.5)
WBC Count: 5 10*3/uL (ref 4.0–10.5)
nRBC: 0 % (ref 0.0–0.2)

## 2023-07-04 LAB — CMP (CANCER CENTER ONLY)
ALT: 10 U/L (ref 0–44)
AST: 13 U/L — ABNORMAL LOW (ref 15–41)
Albumin: 4 g/dL (ref 3.5–5.0)
Alkaline Phosphatase: 68 U/L (ref 38–126)
Anion gap: 5 (ref 5–15)
BUN: 27 mg/dL — ABNORMAL HIGH (ref 8–23)
CO2: 30 mmol/L (ref 22–32)
Calcium: 9.6 mg/dL (ref 8.9–10.3)
Chloride: 105 mmol/L (ref 98–111)
Creatinine: 1.62 mg/dL — ABNORMAL HIGH (ref 0.61–1.24)
GFR, Estimated: 43 mL/min — ABNORMAL LOW (ref >60)
Glucose, Bld: 134 mg/dL — ABNORMAL HIGH (ref 70–99)
Potassium: 4.3 mmol/L (ref 3.5–5.1)
Sodium: 140 mmol/L (ref 135–145)
Total Bilirubin: 0.4 mg/dL (ref <1.2)
Total Protein: 8.2 g/dL — ABNORMAL HIGH (ref 6.5–8.1)

## 2023-07-09 ENCOUNTER — Inpatient Hospital Stay (HOSPITAL_BASED_OUTPATIENT_CLINIC_OR_DEPARTMENT_OTHER): Payer: Medicare Other | Admitting: Internal Medicine

## 2023-07-09 VITALS — BP 140/72 | HR 71 | Temp 98.5°F | Resp 17 | Ht 75.0 in | Wt 173.1 lb

## 2023-07-09 DIAGNOSIS — C7B02 Secondary carcinoid tumors of liver: Secondary | ICD-10-CM | POA: Diagnosis not present

## 2023-07-09 DIAGNOSIS — M25561 Pain in right knee: Secondary | ICD-10-CM | POA: Diagnosis not present

## 2023-07-09 DIAGNOSIS — Z79630 Long term (current) use of alkylating agent: Secondary | ICD-10-CM | POA: Diagnosis not present

## 2023-07-09 DIAGNOSIS — R42 Dizziness and giddiness: Secondary | ICD-10-CM | POA: Diagnosis not present

## 2023-07-09 DIAGNOSIS — C7A8 Other malignant neuroendocrine tumors: Secondary | ICD-10-CM

## 2023-07-09 DIAGNOSIS — C7A090 Malignant carcinoid tumor of the bronchus and lung: Secondary | ICD-10-CM | POA: Diagnosis not present

## 2023-07-09 DIAGNOSIS — C7B03 Secondary carcinoid tumors of bone: Secondary | ICD-10-CM | POA: Diagnosis not present

## 2023-07-09 DIAGNOSIS — R519 Headache, unspecified: Secondary | ICD-10-CM | POA: Diagnosis not present

## 2023-07-09 DIAGNOSIS — Z79899 Other long term (current) drug therapy: Secondary | ICD-10-CM | POA: Diagnosis not present
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
## 2023-08-24 ENCOUNTER — Other Ambulatory Visit: Payer: Self-pay | Admitting: Family Medicine

## 2023-08-29 ENCOUNTER — Other Ambulatory Visit (HOSPITAL_BASED_OUTPATIENT_CLINIC_OR_DEPARTMENT_OTHER): Payer: Self-pay

## 2023-08-29 DIAGNOSIS — Z23 Encounter for immunization: Secondary | ICD-10-CM | POA: Diagnosis not present

## 2023-08-29 MED ORDER — PNEUMOCOCCAL 20-VAL CONJ VACC 0.5 ML IM SUSY
0.5000 mL | PREFILLED_SYRINGE | Freq: Once | INTRAMUSCULAR | 0 refills | Status: AC
  Filled 2023-08-29: qty 0.5, 1d supply, fill #0

## 2023-09-19 ENCOUNTER — Encounter: Payer: Self-pay | Admitting: Podiatry

## 2023-09-19 ENCOUNTER — Ambulatory Visit (INDEPENDENT_AMBULATORY_CARE_PROVIDER_SITE_OTHER): Payer: Medicare Other | Admitting: Podiatry

## 2023-09-19 VITALS — Ht 75.0 in | Wt 173.0 lb

## 2023-09-19 DIAGNOSIS — B351 Tinea unguium: Secondary | ICD-10-CM

## 2023-09-19 DIAGNOSIS — M79675 Pain in left toe(s): Secondary | ICD-10-CM | POA: Diagnosis not present

## 2023-09-19 DIAGNOSIS — M79674 Pain in right toe(s): Secondary | ICD-10-CM

## 2023-09-19 DIAGNOSIS — E1151 Type 2 diabetes mellitus with diabetic peripheral angiopathy without gangrene: Secondary | ICD-10-CM

## 2023-09-23 ENCOUNTER — Encounter: Payer: Self-pay | Admitting: Podiatry

## 2023-09-23 NOTE — Progress Notes (Signed)
  Subjective:  Patient ID: Richard Navarro, male    DOB: Apr 30, 1946,  MRN: [id number]  78 y.o. male presents to clinic today with at risk foot care. Patient has h/o PAD and painful, elongated thickened toenails x 10 which are symptomatic when wearing enclosed shoe gear. This interferes with his/her daily activities. Chief Complaint  Patient presents with   [facility]    He is here for nail trim, his PCP is Dr. Durene Cal and last seen 2 months ago, last A1C was 6.7.    PCP is Shelva Majestic, MD.  Allergies  Allergen Reactions   Afinitor [Everolimus] Swelling and Other (See Comments)    Angioedema- Patient was on this and Lisinopril at the same time; had no trouble breathing, but throat became swollen   Lisinopril Swelling and Other (See Comments)    Angioedema- on this and afinitor same time; had no trouble breathing, but throat became swollen   Other Other (See Comments)    Certain seafoods cause gout flares   Statins Other (See Comments)    Joint pain   Simvastatin Other (See Comments)    Joint pain    Review of Systems: Negative except as noted in the HPI.   Objective:  Richard Navarro is a pleasant 78 y.o. male thin build in NAD. AAO x 3.  Vascular Examination: Vascular status intact b/l with faintly palpable pedal pulses. CFT immediate b/l. No edema. No pain with calf compression b/l. Skin temperature gradient WNL b/l.   Neurological Examination: Sensation grossly intact b/l with 10 gram monofilament. Vibratory sensation intact b/l.   Dermatological Examination: Toenails 1-5 b/l thick, discolored, elongated with subungual debris and pain on dorsal palpation. No hyperkeratotic lesions noted b/l. Pedal skin thin and atrophic b/l LE.  Musculoskeletal Examination: Muscle strength 5/5 to b/l LE. Clawtoe deformity 2-5 bilaterally.  Radiographs: None  Last A1c:      Latest Ref Rng & Units 03/22/2023    4:40 PM  Hemoglobin A1C  Hemoglobin-A1c 4.6 - 6.5 % 6.7      Assessment:    1. Pain due to onychomycosis of toenails of both feet   2. Type II diabetes mellitus with peripheral circulatory disorder [facility])     Plan:  Patient was evaluated and treated. All patient's and/or POA's questions/concerns addressed on today's visit. Toenails 1-5 debrided in length and girth without incident. Continue soft, supportive shoe gear daily. Report any pedal injuries to medical professional. Call office if there are any questions/concerns. -Continue foot and shoe inspections daily. Monitor blood glucose per PCP/Endocrinologist's recommendations. -Patient/POA to call should there be question/concern in the interim.  Return in about 3 months (around 12/18/2023).  Freddie Breech, DPM      [place] LOCATION: [address] [place] [address]                   Office [phone]   [facility] LOCATION: [address] [place], [place] [address] Office [phone]

## 2023-09-27 ENCOUNTER — Ambulatory Visit: Payer: Medicare Other | Admitting: Family Medicine

## 2023-09-27 ENCOUNTER — Encounter: Payer: Self-pay | Admitting: Family Medicine

## 2023-09-27 VITALS — BP 100/60 | HR 62 | Temp 97.2°F | Ht 75.0 in | Wt 175.0 lb

## 2023-09-27 DIAGNOSIS — C7B8 Other secondary neuroendocrine tumors: Secondary | ICD-10-CM | POA: Diagnosis not present

## 2023-09-27 DIAGNOSIS — C7A8 Other malignant neuroendocrine tumors: Secondary | ICD-10-CM

## 2023-09-27 DIAGNOSIS — M1A00X0 Idiopathic chronic gout, unspecified site, without tophus (tophi): Secondary | ICD-10-CM

## 2023-09-27 DIAGNOSIS — N1832 Chronic kidney disease, stage 3b: Secondary | ICD-10-CM

## 2023-09-27 DIAGNOSIS — I10 Essential (primary) hypertension: Secondary | ICD-10-CM | POA: Diagnosis not present

## 2023-09-27 DIAGNOSIS — E785 Hyperlipidemia, unspecified: Secondary | ICD-10-CM | POA: Diagnosis not present

## 2023-09-27 DIAGNOSIS — E1122 Type 2 diabetes mellitus with diabetic chronic kidney disease: Secondary | ICD-10-CM

## 2023-09-27 DIAGNOSIS — F331 Major depressive disorder, recurrent, moderate: Secondary | ICD-10-CM | POA: Diagnosis not present

## 2023-09-27 LAB — POCT GLYCOSYLATED HEMOGLOBIN (HGB A1C): Hemoglobin A1C: 6.6 % — AB (ref 4.0–5.6)

## 2023-09-27 NOTE — Addendum Note (Signed)
 Addended by: Almira Jaeger on: 09/27/2023 06:01 PM   Modules accepted: Level of Service

## 2023-09-27 NOTE — Progress Notes (Signed)
 Phone [phone] In person visit   Subjective:   Richard Navarro is a 78 y.o. year old very pleasant male patient who presents for/with See problem oriented charting Chief Complaint  Patient presents with   Annual Exam   Diabetes    DEE scheduled upcoming.   Hypertension   Past Medical History-  Patient Active Problem List   Diagnosis Date Noted   Chronic hypoxemic respiratory failure (HCC) 08/04/2021    Priority: High   Anemia 05/07/2018    Priority: High   Chronic kidney disease (CKD) stage G3b/A1, moderately decreased glomerular filtration rate (GFR) between 30-44 mL/min/1.73 square meter and albuminuria creatinine ratio less than 30 mg/g (HCC) 03/04/2018    Priority: High   Neuroendocrine carcinoma (HCC) 08/09/2017    Priority: High   Primary cancer of right lung (HCC) 12/22/2016    Priority: High   Diabetes mellitus with renal manifestation (HCC)     Priority: High   Myalgia due to statin 03/04/2018    Priority: Medium    COPD (chronic obstructive pulmonary disease) (HCC) 12/01/2016    Priority: Medium    PTSD (post-traumatic stress disorder) 12/07/2015    Priority: Medium    Erectile dysfunction 04/09/2014    Priority: Medium    Former smoker 03/26/2014    Priority: Medium    Essential hypertension     Priority: Medium    Gout     Priority: Medium    Depression     Priority: Medium    Hyperlipidemia     Priority: Medium    Lobar pneumonia (HCC) 08/09/2017    Priority: Low   Encounter for antineoplastic chemotherapy 01/10/2017    Priority: Low   Goals of care, counseling/discussion 01/10/2017    Priority: Low   Prostate cancer screening 04/17/2014    Priority: Low   Arthritis 03/26/2014    Priority: Low   History of adenomatous polyp of colon 03/26/2014    Priority: Low   GERD (gastroesophageal reflux disease)     Priority: Low   Moderate episode of recurrent major depressive disorder (HCC) 09/27/2023   Pleural effusion exudative 01/23/2023    HCAP (healthcare-associated pneumonia) 01/20/2023   Depressive disorder 10/24/2022   Chronic rhinitis 10/19/2022   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   Dysphagia    Encounter for colorectal cancer screening    Prolonged QT interval 04/17/2021   Sepsis (HCC) 04/16/2021   Alcohol abuse 10/26/2020   Benign neoplasm of colon 10/26/2020   Carpal tunnel syndrome 10/26/2020   Family history of malignant neoplasm of gastrointestinal tract 10/26/2020   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 3 each 2   allopurinol  (ZYLOPRIM ) 100 MG tablet TAKE 1 TABLET DAILY 90 tablet 3   amLODipine  (NORVASC ) 10 MG tablet TAKE 1 TABLET DAILY 90 tablet 3   Cholecalciferol  (VITAMIN D3) 25 MCG (1000 UT) CAPS Take 1,000 Units by mouth daily.     Continuous Blood Gluc Receiver (DEXCOM G7 RECEIVER) DEVI  Please provide 1 receiver 1 each 0   Continuous Blood Gluc Sensor (DEXCOM G7 SENSOR) MISC 1 Box by Does not apply route every 30 (thirty) days. 3 each 3   desvenlafaxine  (PRISTIQ ) 25 MG 24 hr tablet Take 1 tablet (25 mg total) by mouth daily. 90 tablet 3   docusate sodium  (COLACE) 100 MG capsule Take 300 mg by mouth daily.     feeding supplement, GLUCERNA SHAKE, (GLUCERNA SHAKE) LIQD Take 237 mLs by mouth 2 (two) times daily as needed (when not eating).     Ferrous Sulfate  (IRON) 325 (65 Fe) MG TABS Take 325 mg by mouth See admin instructions. Take  325 mg by mouth every other night     GAVISCON EXTRA STRENGTH 160-105 MG CHEW Chew 1 tablet by mouth every 6 (six) hours as needed (for gas).     glipiZIDE  (GLUCOTROL ) 5 MG tablet TAKE 1 TABLET DAILY BEFORE BREAKFAST (Patient taking differently: Take 5 mg by mouth daily before breakfast.) 90 tablet 3   glucose blood test strip Use to test blood sugar twice a day 200 each 3   guaiFENesin  (MUCINEX ) 600 MG 12 hr tablet Take 600 mg by mouth 2 (two) times daily.     ketotifen (ZADITOR) 0.025 % ophthalmic solution Place 1 drop into both eyes 2 (two) times daily as needed (allergies).     magnesium  hydroxide (MILK OF MAGNESIA) 400 MG/5ML suspension Take 30 mLs by mouth daily as needed (constipation).     metoprolol  tartrate (LOPRESSOR ) 25 MG tablet TAKE 1 TABLET TWICE A DAY (Patient taking differently: Take 25 mg by mouth 2 (two) times daily.) 180 tablet 3   OXYGEN  Inhale 2 L/min into the lungs as needed (for shortness of breath).     pantoprazole  (PROTONIX ) 40 MG tablet TAKE 1 TABLET TWICE A DAY 180 tablet 3   polyethylene glycol (MIRALAX  / GLYCOLAX ) 17 g packet Take 17 g by mouth See admin instructions. Mix 17 grams into the suggested amount of water  and drink once a day- HOLD FOR DIARRHEA     simethicone (MYLICON) 125 MG chewable tablet Chew 125 mg by mouth every 6 (six) hours as needed for flatulence.     Tiotropium Bromide -Olodaterol (STIOLTO RESPIMAT ) 2.5-2.5 MCG/ACT AERS Inhale 2 puffs into the lungs daily. 4 g 11   vitamin B-12 (CYANOCOBALAMIN ) 1000 MCG tablet Take 1,000 mcg by mouth daily.      vitamin C  (ASCORBIC ACID ) 500 MG tablet Take 500 mg by mouth daily.     ALPRAZolam  (XANAX ) 0.5 MG tablet Take 1 tablet (0.5 mg total) by mouth daily as needed for anxiety. (Patient not taking: Reported on 09/27/2023) 5 tablet 0   No current facility-administered medications for this visit.     Objective:  BP 100/60   Pulse 62   Temp (!) 97.2 F (36.2 C)   Ht 6' 3 (1.905 m)   Wt 175 lb (79.4 kg)    SpO2 100%   BMI 21.87 kg/m  Gen: NAD, resting comfortably CV: RRR no murmurs rubs or gallops Lungs: CTAB no crackles, wheeze, rhonchi Ext: no edema Skin: warm, dry     Assessment and Plan   #Balance issues and weakness- some balance and dizziness issues when first gets up in the morning in particular. Other times feels weak in legs. Sugars haven't been low or particularly high. Has not checked home BP.  -some worsened with standing up- see hypertension section -he feels more likely if does increase in sugar levels so agrees  to check sugars- would also like blood pressure checked -encouraged cane  # Lung cancer/carcinoid- treatment on hold since march 2024. Stable scans prior to November oncology visit. Plan is 3 month repeat - scheduled on Monday - does have metastases to liver but stable  #Chronic kidney disease stage III S: Patient with significant worsening in kidney function dating back to November 2020 when patient was on Afinitor  previously- later went back on this but did not worsen as much (did raise CBGs though and determined not effective so switched). Follows with [facility] A/P: GFR 43 most recently- overall has been doing well- continue continue to monitor- just checked in November and planned recheck with oncology   #Diabetes mellitus S: medication(s): glipizide  5 mg in AM -rare home checks but one time was 189 before breakfast Lab Results  Component Value Date   HGBA1C poc 6.6 (A) 09/27/2023   HGBA1C 6.7 (H) 03/22/2023   HGBA1C 6.1 (A) 09/22/2022  A/P:  well controlled- continue current medications- make sure no low blood sugars when feeling off  #Hypertension  S: Compliant with amlodipine  10 mg and metoprolol  25 mg twice daily BP Readings from Last 3 Encounters:  09/27/23 100/60  07/09/23 (!) 140/72  05/15/23 (!) 140/74  A/P: perhaps overcontrolled- Cut amlodipine  in half to 5 mg and update me in 2 weeks with daily home blood pressure readings- mychart or  drop off copy- low blood pressure could contribute to lightheadedness with standing   #Gout S: Compliant with allopurinol  100 mg.  A/P: no recent flares- continue current medications    # Depression S: Medication:prescription for  Pristiq   25 mg but never started A/P:  phq9 at 12 today- down slightly from 15 last visit but rates only as somewhat difficult  improved from very difficult. Mild poor control but he wants to remain off medicine- he feels he is trending in right direction but will let me know if fails to continue to improve. Still feels like he goes through ups and downs after [employer] service in past  #Health maintenance Plans on Tetanus, Diphtheria, and Pertussis (Tdap) and RSV at pharmacy. Otherwise up to date   Recommended follow up: Return in about 6 months (around 03/26/2024) for followup or sooner if needed.Schedule b4 you leave. Future Appointments  Date Time Provider Department Center  10/01/2023  9:15 AM [facility] None  10/01/2023 10:30 AM [facility] 2 [facility] [place]  10/11/2023 11:30 AM Sherrod Sherrod, MD [facility] None  11/01/2023  1:30 PM Shelah Lamar RAMAN, MD [facility] None  11/29/2023  9:15 AM [facility] ANNUAL WELLNESS VISIT 1 [facility]  01/23/2024  2:00 PM [place], Delon CROME, DPM [facility]   Lab/Order associations:   ICD-10-CM   1. Type 2 diabetes mellitus with stage 3b chronic kidney disease, without long-term current use of insulin  (HCC)  E11.22 POCT HgB A1C   N18.32     2. Neuroendocrine carcinoma (HCC)  C7A.8     3. Chronic kidney disease (CKD) stage G3b/A1, moderately decreased glomerular filtration rate (GFR) between 30-44 mL/min/1.73 square meter and albuminuria creatinine ratio less than 30 mg/g (HCC)  N18.32     4. Essential hypertension  I10     5. Idiopathic chronic gout without tophus, unspecified site  M1A.00X0     6. Hyperlipidemia, unspecified hyperlipidemia type  E78.5     7. Moderate episode of recurrent major  depressive disorder (HCC) Chronic F33.1     8. 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  Return precautions advised.  Garnette Lukes, MD

## 2023-09-27 NOTE — Patient Instructions (Addendum)
 You are eligible to schedule your annual wellness visit with our nurse specialist [place].  Please consider scheduling this before you leave today  Check blood sugars and blood pressure if having balance issues, weakness, or increased fatigue and update me if issues  Pretty please use one of those canes until at least feel better  Cut amlodipine  in half to 5 mg and update me in 2 weeks with daily home blood pressure readings- mychart or drop off copy- low blood pressure could contribute to lightheadedness with standing   Recommended follow up: Return in about 6 months (around 03/26/2024) for followup or sooner if needed.Schedule b4 you leave.

## 2023-10-01 ENCOUNTER — Ambulatory Visit (HOSPITAL_COMMUNITY)
Admission: RE | Admit: 2023-10-01 | Discharge: 2023-10-01 | Disposition: A | Discharge status: Home / Self Care | Payer: Medicare Other | Source: Ambulatory Visit | Attending: Internal Medicine | Admitting: Internal Medicine

## 2023-10-01 ENCOUNTER — Encounter (HOSPITAL_COMMUNITY): Payer: Self-pay

## 2023-10-01 ENCOUNTER — Inpatient Hospital Stay: Payer: Medicare Other | Attending: Internal Medicine

## 2023-10-01 DIAGNOSIS — C7A090 Malignant carcinoid tumor of the bronchus and lung: Secondary | ICD-10-CM | POA: Insufficient documentation

## 2023-10-01 DIAGNOSIS — I700 Atherosclerosis of aorta: Secondary | ICD-10-CM | POA: Diagnosis not present

## 2023-10-01 DIAGNOSIS — C7B02 Secondary carcinoid tumors of liver: Secondary | ICD-10-CM | POA: Diagnosis not present

## 2023-10-01 DIAGNOSIS — C7A8 Other malignant neuroendocrine tumors: Secondary | ICD-10-CM

## 2023-10-01 DIAGNOSIS — C7B03 Secondary carcinoid tumors of bone: Secondary | ICD-10-CM | POA: Insufficient documentation

## 2023-10-01 DIAGNOSIS — J432 Centrilobular emphysema: Secondary | ICD-10-CM | POA: Diagnosis not present

## 2023-10-01 LAB — CMP (CANCER CENTER ONLY)
ALT: 10 U/L (ref 0–44)
AST: 12 U/L — ABNORMAL LOW (ref 15–41)
Albumin: 4.2 g/dL (ref 3.5–5.0)
Alkaline Phosphatase: 85 U/L (ref 38–126)
Anion gap: 7 (ref 5–15)
BUN: 19 mg/dL (ref 8–23)
CO2: 27 mmol/L (ref 22–32)
Calcium: 9.5 mg/dL (ref 8.9–10.3)
Chloride: 103 mmol/L (ref 98–111)
Creatinine: 1.69 mg/dL — ABNORMAL HIGH (ref 0.61–1.24)
GFR, Estimated: 41 mL/min — ABNORMAL LOW (ref >60)
Glucose, Bld: 142 mg/dL — ABNORMAL HIGH (ref 70–99)
Potassium: 3.9 mmol/L (ref 3.5–5.1)
Sodium: 137 mmol/L (ref 135–145)
Total Bilirubin: 0.5 mg/dL (ref 0.0–1.2)
Total Protein: 8.6 g/dL — ABNORMAL HIGH (ref 6.5–8.1)

## 2023-10-01 LAB — CBC WITH DIFFERENTIAL (CANCER CENTER ONLY)
Abs Immature Granulocytes: 0.04 10*3/uL (ref 0.00–0.07)
Basophils Absolute: 0.2 10*3/uL — ABNORMAL HIGH (ref 0.0–0.1)
Basophils Relative: 2 %
Eosinophils Absolute: 0.1 10*3/uL (ref 0.0–0.5)
Eosinophils Relative: 1 %
HCT: 43.8 % (ref 39.0–52.0)
Hemoglobin: 14.4 g/dL (ref 13.0–17.0)
Immature Granulocytes: 1 %
Lymphocytes Relative: 13 %
Lymphs Abs: 0.8 10*3/uL (ref 0.7–4.0)
MCH: 29.3 pg (ref 26.0–34.0)
MCHC: 32.9 g/dL (ref 30.0–36.0)
MCV: 89.2 fL (ref 80.0–100.0)
Monocytes Absolute: 0.3 10*3/uL (ref 0.1–1.0)
Monocytes Relative: 5 %
Neutro Abs: 5 10*3/uL (ref 1.7–7.7)
Neutrophils Relative %: 78 %
Platelet Count: 177 10*3/uL (ref 150–400)
RBC: 4.91 MIL/uL (ref 4.22–5.81)
RDW: 15.5 % (ref 11.5–15.5)
WBC Count: 6.3 10*3/uL (ref 4.0–10.5)
nRBC: 0 % (ref 0.0–0.2)

## 2023-10-01 LAB — LACTATE DEHYDROGENASE: LDH: 130 U/L (ref 98–192)

## 2023-10-09 DIAGNOSIS — H04123 Dry eye syndrome of bilateral lacrimal glands: Secondary | ICD-10-CM | POA: Diagnosis not present

## 2023-10-09 DIAGNOSIS — E119 Type 2 diabetes mellitus without complications: Secondary | ICD-10-CM | POA: Diagnosis not present

## 2023-10-09 LAB — HM DIABETES EYE EXAM

## 2023-10-11 ENCOUNTER — Inpatient Hospital Stay: Payer: Medicare Other | Admitting: Internal Medicine

## 2023-10-18 ENCOUNTER — Inpatient Hospital Stay (HOSPITAL_BASED_OUTPATIENT_CLINIC_OR_DEPARTMENT_OTHER): Payer: Medicare Other | Admitting: Internal Medicine

## 2023-10-18 ENCOUNTER — Telehealth: Payer: Self-pay | Admitting: Family Medicine

## 2023-10-18 VITALS — BP 148/95 | HR 74 | Temp 97.9°F | Resp 16 | Ht 75.0 in | Wt 177.1 lb

## 2023-10-18 DIAGNOSIS — C7B03 Secondary carcinoid tumors of bone: Secondary | ICD-10-CM | POA: Diagnosis not present

## 2023-10-18 DIAGNOSIS — C7A8 Other malignant neuroendocrine tumors: Secondary | ICD-10-CM

## 2023-10-18 DIAGNOSIS — C7B02 Secondary carcinoid tumors of liver: Secondary | ICD-10-CM | POA: Diagnosis not present

## 2023-10-18 DIAGNOSIS — C7A090 Malignant carcinoid tumor of the bronchus and lung: Secondary | ICD-10-CM | POA: Diagnosis not present

## 2023-10-18 NOTE — Telephone Encounter (Signed)
 Patient/partner dropped off 20 blood pressure readings-many readings are in the 120s and as low as 121/60 since reducing amlodipine to 5 mg.  He has felt much less lightheaded.  He does have some tougher days/more emotional days and blood pressure can get up to 160 but this is sparing.  A lot of readings in 130s or 140s-since he feels so much better he prefers to stay on this regimen-agrees to reach out if numbers consistently above 145 and we may need to reconsider
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
## 2023-10-18 NOTE — Addendum Note (Signed)
 Addended by: Charma Igo on: 10/18/2023 10:43 AM   Modules accepted: Orders

## 2023-11-01 ENCOUNTER — Other Ambulatory Visit (HOSPITAL_BASED_OUTPATIENT_CLINIC_OR_DEPARTMENT_OTHER): Payer: Self-pay

## 2023-11-01 ENCOUNTER — Ambulatory Visit (INDEPENDENT_AMBULATORY_CARE_PROVIDER_SITE_OTHER): Payer: Medicare Other | Admitting: Emergency Medicine

## 2023-11-01 ENCOUNTER — Encounter: Payer: Self-pay | Admitting: Emergency Medicine

## 2023-11-01 VITALS — BP 170/78 | HR 72 | Ht 75.0 in | Wt 174.4 lb

## 2023-11-01 DIAGNOSIS — J449 Chronic obstructive pulmonary disease, unspecified: Secondary | ICD-10-CM | POA: Diagnosis not present

## 2023-11-01 DIAGNOSIS — C3491 Malignant neoplasm of unspecified part of right bronchus or lung: Secondary | ICD-10-CM

## 2023-11-01 DIAGNOSIS — J310 Chronic rhinitis: Secondary | ICD-10-CM | POA: Diagnosis not present

## 2023-11-01 DIAGNOSIS — C3431 Malignant neoplasm of lower lobe, right bronchus or lung: Secondary | ICD-10-CM | POA: Diagnosis not present

## 2023-11-01 DIAGNOSIS — J9611 Chronic respiratory failure with hypoxia: Secondary | ICD-10-CM | POA: Diagnosis not present

## 2023-11-01 MED ORDER — STIOLTO RESPIMAT 2.5-2.5 MCG/ACT IN AERS: 2.0000 | INHALATION_SPRAY | Freq: Every day | RESPIRATORY_TRACT | 11 refills | Status: DC

## 2023-11-01 MED ORDER — RSVPREF3 VAC RECOMB ADJUVANTED 120 MCG/0.5ML IM SUSR
0.5000 mL | Freq: Once | INTRAMUSCULAR | 0 refills | Status: AC
  Filled 2023-11-01: qty 0.5, 1d supply, fill #0

## 2023-11-01 NOTE — Assessment & Plan Note (Signed)
 CT chest abdomen pelvis are stable.  He does have the scarring leftover from his right lower lobe pneumonia, right parapneumonic effusion.  He is currently on observation and follows with Dr. Arbutus Ped.

## 2023-11-01 NOTE — Assessment & Plan Note (Addendum)
 Richard Navarro

## 2023-11-01 NOTE — Patient Instructions (Addendum)
 We reviewed your CT scans today. Please continue to follow with Dr. Arbutus Ped as planned Continue your Stiolto 2 puffs once daily. Keep your albuterol available to use 2 puffs when needed for shortness of breath, chest tightness, wheezing. Keep track of your oxygen saturations.  You may need to be more diligent about wearing your oxygen when you exert yourself.  Our goal is to keep your saturations > 90% is much as possible We talked today about nasal congestion and drainage.  Continue Mucinex as you have been using it.  Please let us know if you would like to consider using other medication to help treat this. Follow with Dr. Delton Coombes in 12 months or sooner if you have any problems.

## 2023-11-01 NOTE — Assessment & Plan Note (Signed)
 He has a lot of weakness and fatigue.  I think he is under using his supplemental oxygen.  He does not like to wear it.  I have asked him and his wife to try to correlate his symptoms, weakness, fatigue with oxygen saturations.  Hopefully if they can make connection between desaturations and his symptoms then it would be easier for him to be compliant  Keep track of your oxygen saturations.  You may need to be more diligent about wearing your oxygen when you exert yourself.  Our goal is to keep your saturations > 90% is much as possible

## 2023-11-01 NOTE — Progress Notes (Signed)
 Subjective:    Patient ID: Richard Navarro, male    DOB: [birth year], 78 y.o.   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 Arbutus Ped.  I have seen him for this as well as for COPD with associated hypoxemic respiratory failure.  He was hospitalized in July for a postobstructive pneumonia, parapneumonic effusion, chest tube and lytics. He reports that he is slowly regaining strength. He has kept imbalance, no falls since I saw him last.  He is currently off chemo, follows w Dr Arbutus Ped He is on Stiolto, rare albuterol use.   CT abd / pelvis / chest 04/09/23 reviewed   ROV 11/01/2023 --Richard Navarro is 20 and has a history of stage IV neuroendocrine cancer with lung involvement, liver involvement followed by Dr. Arbutus Ped.  We follow him for COPD and associated hypoxemic respiratory failure, hospitalization in 6-02/2023 for a postobstructive pneumonia with parapneumonic effusion that required chest tube and lytics.  He has been on observation since 10/2022 as he was dealing with persistent fatigue, cough, sputum production. Today he reports weakness, especially in his right leg. At least once a day he has sneezing spell, often a coughing spell. Has nasal congestion, no real sputum production.  His breathing is stable. COVID vaccine and PNA vaccine up to date. He does get SOB w exertion. Currently managed on Stiolto.  Has albuterol but rarely needs it.  He has a POC, but only uses when he is SOB. No flares since last time.   CT scan of the chest, abdomen, pelvis 10/01/2023 reviewed by me shows emphysematous change, stable rounded consolidation in the central right lower lobe 3.6 x 4.5 cm, some associated right fibrothorax, interval clearing of nodular consolidation in the lateral right upper lobe (compared with 06/2023).  No new suspicious nodules.  Review of Systems As per  HPI     Objective:   Physical Exam   Vitals:   11/01/23 1333  BP: (!) 170/78  Pulse: 72  SpO2: 95%  Weight: 174 lb 6.4 oz (79.1 kg)  Height: 6\' 3"  (1.905 m)    Gen: Pleasant, thin, in no distress,  normal affect  ENT: Small ulceration left posterior pharynx. 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  He does have the scarring leftover from his right lower lobe pneumonia, right parapneumonic effusion.  He is currently on observation and follows with Dr. Arbutus Ped.  COPD (chronic obstructive pulmonary disease) (HCC) Continue your Stiolto 2 puffs once daily. Keep your albuterol available to use 2 puffs when needed for shortness of breath, chest tightness, wheezing. Follow with Dr. Delton Coombes in 12 months or sooner if you have any problems.   Chronic rhinitis We talked today about nasal congestion and drainage.  Continue Mucinex as you have been using it.  Please let us know if you would like to consider using other medication to help treat this.  Chronic hypoxemic respiratory failure (HCC) He  has a lot of weakness and fatigue.  I think he is under using his supplemental oxygen.  He does not like to wear it.  I have asked him and his wife to try to correlate his symptoms, weakness, fatigue with oxygen saturations.  Hopefully if they can make connection between desaturations and his symptoms then it would be easier for him to be compliant  Keep track of your oxygen saturations.  You may need to be more diligent about wearing your oxygen when you  exert yourself.  Our goal is to keep your saturations > 90% is much as possible  Primary cancer of right lung (HCC)       Levy Pupa, MD, PhD 11/01/2023, 3:23 PM [facility] [phone] or if no answer [phone]

## 2023-11-01 NOTE — Assessment & Plan Note (Signed)
 Continue your Stiolto 2 puffs once daily. Keep your albuterol available to use 2 puffs when needed for shortness of breath, chest tightness, wheezing. Follow with Dr. Delton Coombes in 12 months or sooner if you have any problems.

## 2023-11-01 NOTE — Assessment & Plan Note (Signed)
 We talked today about nasal congestion and drainage.  Continue Mucinex as you have been using it.  Please let us know if you would like to consider using other medication to help treat this.

## 2023-11-05 ENCOUNTER — Other Ambulatory Visit: Payer: Self-pay | Admitting: Family Medicine

## 2023-11-12 ENCOUNTER — Other Ambulatory Visit: Payer: Self-pay | Admitting: Family Medicine

## 2023-11-21 ENCOUNTER — Other Ambulatory Visit: Payer: Self-pay | Admitting: Family Medicine

## 2023-11-22 ENCOUNTER — Other Ambulatory Visit: Payer: Self-pay | Admitting: Family Medicine

## 2023-12-26 ENCOUNTER — Encounter (HOSPITAL_COMMUNITY): Payer: Self-pay

## 2024-01-02 ENCOUNTER — Ambulatory Visit

## 2024-01-03 ENCOUNTER — Ambulatory Visit (INDEPENDENT_AMBULATORY_CARE_PROVIDER_SITE_OTHER)

## 2024-01-03 VITALS — Ht 75.0 in | Wt 174.0 lb

## 2024-01-03 DIAGNOSIS — Z0000 Encounter for general adult medical examination without abnormal findings: Secondary | ICD-10-CM
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
## 2024-01-03 NOTE — Patient Instructions (Signed)
 [name] , Thank you for taking time out of your busy schedule to complete your Annual Wellness Visit with me. I enjoyed our conversation and look forward to speaking with you again next year. I, as well as your care team,  appreciate your ongoing commitment to your health goals. Please review the following plan we discussed and let me know if I can assist you in the future. Your Game plan/ To Do List    Referrals: If you haven't heard from the office you've been referred to, please reach out to them at the phone provided.   Follow up Visits: Next Medicare AWV with our clinical staff: 01/12/25   Have you seen your provider in the last 6 months (3 months if uncontrolled diabetes)? Yes Next Office Visit with your provider: 03/27/24  Clinician Recommendations: Each day, aim for 6 glasses of water , plenty of protein in your diet and try to get up and walk/ stretch every hour for 5-10 minutes at a time.        This is a list of the screening recommended for you and due dates:  Health Maintenance  Topic Date Due   Zoster (Shingles) Vaccine (1 of 2) 03/05/1965   Eye exam for diabetics  10/20/2022   COVID-19 Vaccine (8 - Pfizer risk 2024-25 season) 12/26/2023   Complete foot exam   02/27/2024   Flu Shot  03/21/2024   Yearly kidney health urinalysis for diabetes  03/22/2024   Hemoglobin A1C  03/26/2024   Yearly kidney function blood test for diabetes  09/30/2024   Medicare Annual Wellness Visit  01/02/2025   Pneumonia Vaccine  Completed   Hepatitis C Screening  Completed   HPV Vaccine  Aged Out   Meningitis B Vaccine  Aged Out   DTaP/Tdap/Td vaccine  Discontinued   Colon Cancer Screening  Discontinued    Advanced directives: (Declined) Advance directive discussed with you today. Even though you declined this today, please call our office should you change your mind, and we can give you the proper paperwork for you to fill out. Advance Care Planning is important because it:  [x]  Makes  sure you receive the medical care that is consistent with your values, goals, and preferences  [x]  It provides guidance to your family and loved ones and reduces their decisional burden about whether or not they are making the right decisions based on your wishes.  Follow the link provided in your after visit summary or read over the paperwork we have mailed to you to help you started getting your Advance Directives in place. If you need assistance in completing these, please reach out to us  so that we can help you!  See attachments for Preventive Care and Fall Prevention Tips.

## 2024-01-23 ENCOUNTER — Ambulatory Visit (INDEPENDENT_AMBULATORY_CARE_PROVIDER_SITE_OTHER): Payer: Medicare Other | Admitting: Podiatry

## 2024-01-23 ENCOUNTER — Encounter: Payer: Self-pay | Admitting: Podiatry

## 2024-01-23 DIAGNOSIS — M79675 Pain in left toe(s): Secondary | ICD-10-CM | POA: Diagnosis not present

## 2024-01-23 DIAGNOSIS — E1151 Type 2 diabetes mellitus with diabetic peripheral angiopathy without gangrene: Secondary | ICD-10-CM

## 2024-01-23 DIAGNOSIS — B351 Tinea unguium: Secondary | ICD-10-CM

## 2024-01-23 DIAGNOSIS — M79674 Pain in right toe(s): Secondary | ICD-10-CM

## 2024-01-30 NOTE — Progress Notes (Signed)
 Subjective:  Patient ID: Richard Navarro, male    DOB: Dec 04, 1945,  MRN: [id number]  78 y.o. male presents at risk foot care. Pt has h/o NIDDM with PAD and painful elongated mycotic toenails 1-5 bilaterally which are tender when wearing enclosed shoe gear. Pain is relieved with periodic professional debridement. He is accompanied by his significant other, Richard Navarro, on today's visit. She has concerns regarding patient's gait instability. States patient walks holding on to walls at home. He does not use aids such as walker or cane. Chief Complaint  Patient presents with   routine foot care    Rm17/ diabetic blood sugar 141/ Alc 6.7/ last visit pcp Feb 2025   PCP is Almira Jaeger, MD.  Allergies  Allergen Reactions   Afinitor  [Everolimus ] Swelling and Other (See Comments)    Angioedema- Patient was on this and Lisinopril  at the same time; had no trouble breathing, but throat became swollen   Lisinopril  Swelling and Other (See Comments)    Angioedema- on this and afinitor  same time; had no trouble breathing, but throat became swollen   Other Other (See Comments)    Certain seafoods cause gout flares   Statins Other (See Comments)    Joint pain   Simvastatin Other (See Comments)    Joint pain    Review of Systems: Negative except as noted in the HPI.   Objective:  AADEN BUCKMAN is a pleasant 78 y.o. male thin build in NAD. AAO x 3.  Vascular Examination: Vascular status intact b/l with faintly palpable pedal pulses. CFT immediate b/l. Pedal hair absent. No edema. No pain with calf compression b/l. Skin temperature gradient WNL b/l. No varicosities noted. No cyanosis or clubbing noted.  Neurological Examination: Sensation grossly intact b/l with 10 gram monofilament. Vibratory sensation intact b/l.  Dermatological Examination: Pedal skin  thin and atrophic b/l. No open wounds nor interdigital macerations noted. Toenails 1-5 b/l thick, discolored, elongated with  subungual debris and pain on dorsal palpation. No hyperkeratotic lesions noted b/l.   Musculoskeletal Examination: Muscle strength 5/5 to b/l LE.  No pain, crepitus noted b/l. Clawtoe 2-5 b/l. Patient ambulates independently without assistive aids.   Radiographs: None  Last A1c:      Latest Ref Rng & Units 09/27/2023    2:25 PM 03/22/2023    4:40 PM  Hemoglobin A1C  Hemoglobin-A1c 4.0 - 5.6 % 6.6  6.7    Assessment:   1. Pain due to onychomycosis of toenails of both feet   2. Type II diabetes mellitus with peripheral circulatory disorder [facility])     Plan:  -Patient was evaluated today. All questions/concerns addressed on today's visit. -Consent given for treatment as described below: -Discussed gait instability and recommended cane to prevent falls. Patient declined and he states he will discuss with his Oncologist. -Patient to continue soft, supportive shoe gear daily. -Mycotic toenails 1-5 bilaterally were debrided in length and girth with sterile nail nippers and dremel without incident. -Patient/POA to call should there be question/concern in the interim.  Return in about 3 months (around 04/24/2024).  Luella Sager, DPM      [place] LOCATION: [address], [place] [address]  Office [phone]   [facility] LOCATION: [address] [place], [place] [address] Office [phone]

## 2024-01-31 ENCOUNTER — Telehealth: Payer: Self-pay | Admitting: Medical Oncology

## 2024-01-31 ENCOUNTER — Encounter: Payer: Self-pay | Admitting: Medical Oncology

## 2024-01-31 NOTE — Telephone Encounter (Signed)
 [employer] notified that [place] letter will be at check in desk.

## 2024-01-31 NOTE — Telephone Encounter (Signed)
 VA appeal  letter-[name] requested a letter from Dr. Marguerita Shih regarding pts diagnosis. She needs this for his VA appointment next week to appeal a reduction in  his monthly rate of compensation .   The VA has cut his benefits for his diagnosis of lung cancer because his VA exam on September 17, 2023 determined that his service connected lung cancer most closely meets criteria for 0% disabling.

## 2024-02-19 ENCOUNTER — Telehealth: Payer: Self-pay | Admitting: Family Medicine

## 2024-02-19 NOTE — Telephone Encounter (Signed)
 Patient's significant other came in explaining that his VA disability benefits would go from 100% to 0% due to new laws passed. Patient's significant other left a detailed letter for PCP to reference if any other questions arise. She did explain that the nature of the first hearing on 02/26/24 is to discuss how patient hadn't been receiving his VA benefits for his HTN, DM, and CKD. States that the letter from PCP needs to verify that PCP has been treating patient for these illnesses and that the illnesses were present prior to cancer dx. Letter has been placed in PCP box for review.

## 2024-02-20 NOTE — Telephone Encounter (Signed)
In your review folder. 

## 2024-02-20 NOTE — Telephone Encounter (Signed)
 Will review tomorrow. This may be in reference to [place] letter- those can be rather challenging but we will have to take a look- most of the time these can get updated through the [place] system without outside intervention

## 2024-02-21 NOTE — Telephone Encounter (Signed)
 To whom it may concern  This letter is provided to document the chronic medical history of AMARI ZAGAL in relation to his chronic conditions.   The patient has a longstanding history of hypertension and type 2 diabetes mellitus, both of which were diagnosed and managed well prior to his diagnosis of neuroendocrine carcinoma in 2018. These conditions are chronic and unrelated to the malignancy.  Following his diagnosis of neuroendocrine carcinoma, the patient underwent chemotherapy and radiation therapy. During the course of his treatment, he developed chronic kidney disease (CKD). The CKD is believed to have been contributed to by the oncologic treatments, compounded by poor nutritional intake and decreased appetite as well as potentially contrast exposure experienced throughout the treatment process. There is no evidence to suggest that CKD existed prior to the cancer diagnosis.  Further details regarding the patient's cancer diagnosis, staging, and oncologic management will be provided separately by his oncology team.  Please feel free to reach out if further clarification or documentation is needed.  Thanks, Garnette Lukes, MD Patients primary care physician

## 2024-02-25 NOTE — Telephone Encounter (Signed)
 Letter typed up and placed up front for pick up. Called and lm on pt vm to make aware.

## 2024-03-27 ENCOUNTER — Ambulatory Visit (INDEPENDENT_AMBULATORY_CARE_PROVIDER_SITE_OTHER): Payer: Medicare Other | Admitting: Family Medicine

## 2024-03-27 ENCOUNTER — Encounter: Payer: Self-pay | Admitting: Family Medicine

## 2024-03-27 VITALS — BP 122/68 | HR 68 | Temp 97.6°F | Ht 75.0 in | Wt 173.6 lb

## 2024-03-27 DIAGNOSIS — R4189 Other symptoms and signs involving cognitive functions and awareness: Secondary | ICD-10-CM | POA: Diagnosis not present

## 2024-03-27 DIAGNOSIS — R2689 Other abnormalities of gait and mobility: Secondary | ICD-10-CM | POA: Diagnosis not present

## 2024-03-27 DIAGNOSIS — Z114 Encounter for screening for human immunodeficiency virus [HIV]: Secondary | ICD-10-CM | POA: Diagnosis not present

## 2024-03-27 DIAGNOSIS — C7A8 Other malignant neuroendocrine tumors: Secondary | ICD-10-CM | POA: Diagnosis not present

## 2024-03-27 DIAGNOSIS — M1A00X0 Idiopathic chronic gout, unspecified site, without tophus (tophi): Secondary | ICD-10-CM

## 2024-03-27 DIAGNOSIS — E119 Type 2 diabetes mellitus without complications: Secondary | ICD-10-CM | POA: Diagnosis not present

## 2024-03-27 DIAGNOSIS — N1832 Chronic kidney disease, stage 3b: Secondary | ICD-10-CM

## 2024-03-27 DIAGNOSIS — H35033 Hypertensive retinopathy, bilateral: Secondary | ICD-10-CM | POA: Diagnosis not present

## 2024-03-27 DIAGNOSIS — R4181 Age-related cognitive decline: Secondary | ICD-10-CM

## 2024-03-27 DIAGNOSIS — H538 Other visual disturbances: Secondary | ICD-10-CM | POA: Diagnosis not present

## 2024-03-27 DIAGNOSIS — Z961 Presence of intraocular lens: Secondary | ICD-10-CM | POA: Diagnosis not present

## 2024-03-27 DIAGNOSIS — E1122 Type 2 diabetes mellitus with diabetic chronic kidney disease: Secondary | ICD-10-CM

## 2024-03-27 DIAGNOSIS — R413 Other amnesia: Secondary | ICD-10-CM | POA: Diagnosis not present

## 2024-03-27 LAB — VITAMIN B12: Vitamin B-12: 1283 pg/mL — ABNORMAL HIGH (ref 211–911)

## 2024-03-27 LAB — HEMOGLOBIN A1C: Hgb A1c MFr Bld: 7.9 % — ABNORMAL HIGH (ref 4.6–6.5)

## 2024-03-27 LAB — TSH: TSH: 3.89 u[IU]/mL (ref 0.35–5.50)

## 2024-03-27 NOTE — Patient Instructions (Addendum)
 Please stop by lab before you go If you have mychart- we will send your results within 3 business days of us  receiving them.  If you do not have mychart- we will call you about results within 5 business days of us  receiving them.  *please also note that you will see labs on mychart as soon as they post. I will later go in and write notes on them- will say notes from Dr. Katrinka   We have placed a referral for you today to neurology for memory evaluation and balance concerns- please call their # if you do not hear within a week (may be listed below or you may see mychart message within a few days with #).   Memory concern with 25/30 MMSE  - refer to neurology for their opinion and they can weigh in on balance- use your cane  Recommended follow up: Return in about 4 months (around 07/27/2024) for followup or sooner if needed.Schedule b4 you leave.

## 2024-03-27 NOTE — Progress Notes (Signed)
 Phone [phone] In person visit   Subjective:   Richard Navarro is a 78 y.o. 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    Priority: High   Chronic hypoxemic respiratory failure (HCC) 08/04/2021    Priority: High   Anemia 05/07/2018    Priority: High   Chronic kidney disease (CKD) stage G3b/A1, moderately decreased glomerular filtration rate (GFR) between 30-44 mL/min/1.73 square meter and albuminuria creatinine ratio less than 30 mg/g (HCC) 03/04/2018    Priority: High   Neuroendocrine carcinoma (HCC) 08/09/2017    Priority: High   Primary cancer of right lung (HCC) 12/22/2016    Priority: High   Diabetes mellitus with renal manifestation (HCC)     Priority: High   Myalgia due to statin 03/04/2018    Priority: Medium    COPD (chronic obstructive pulmonary disease) (HCC) 12/01/2016    Priority: Medium    PTSD (post-traumatic stress disorder) 12/07/2015    Priority: Medium    Erectile dysfunction 04/09/2014    Priority: Medium    Former smoker 03/26/2014    Priority: Medium    Essential hypertension     Priority: Medium    Gout     Priority: Medium    Depression     Priority: Medium    Hyperlipidemia     Priority: Medium    Lobar pneumonia (HCC) 08/09/2017    Priority: Low   Encounter for antineoplastic chemotherapy 01/10/2017    Priority: Low   Goals of care, counseling/discussion 01/10/2017    Priority: Low   Prostate cancer screening 04/17/2014    Priority: Low   Arthritis 03/26/2014    Priority: Low   History of adenomatous polyp of colon 03/26/2014    Priority: Low   GERD (gastroesophageal reflux disease)      Priority: Low   Carpal tunnel syndrome 10/26/2020    Priority: 1.   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   Pleural effusion exudative 01/23/2023   Chronic rhinitis 10/19/2022   Dysphagia    Prolonged QT interval 04/17/2021   Alcohol abuse 10/26/2020   Tobacco use disorder 10/26/2020   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 3 each 2   allopurinol  (ZYLOPRIM ) 100 MG tablet TAKE 1 TABLET DAILY 90 tablet 3   amLODipine  (NORVASC ) 10 MG tablet TAKE 1 TABLET DAILY 90 tablet 3   Cholecalciferol  (VITAMIN D3) 25 MCG (1000 UT) CAPS Take 1,000 Units by mouth daily.     Continuous Blood Gluc Receiver (DEXCOM G7 RECEIVER) DEVI Please provide 1 receiver 1 each 0   Continuous Blood Gluc Sensor (DEXCOM G7 SENSOR) MISC 1 Box by Does not apply route every 30 (thirty) days. 3 each 3   docusate sodium  (COLACE) 100 MG capsule Take  300 mg by mouth daily.     feeding supplement, GLUCERNA SHAKE, (GLUCERNA SHAKE) LIQD Take 237 mLs by mouth 2 (two) times daily as needed (when not eating).     Ferrous Sulfate  (IRON) 325 (65 Fe) MG TABS Take 325 mg by mouth See admin instructions. Take 325 mg by mouth every other night     GAVISCON EXTRA STRENGTH 160-105 MG CHEW Chew 1 tablet by mouth every 6 (six) hours as needed (for gas).     glipiZIDE  (GLUCOTROL ) 5 MG tablet Take 1 tablet (5 mg total) by mouth daily before breakfast. 90 tablet 3   glucose blood test strip Use to test blood sugar twice a day 200 each 3   guaiFENesin  (MUCINEX ) 600 MG 12 hr tablet Take 600 mg by mouth 2 (two) times daily.     ketotifen  (ZADITOR) 0.025 % ophthalmic solution Place 1 drop into both eyes 2 (two) times daily as needed (allergies).     magnesium  hydroxide (MILK OF MAGNESIA) 400 MG/5ML suspension Take 30 mLs by mouth daily as needed (constipation).     metoprolol  tartrate (LOPRESSOR ) 25 MG tablet TAKE 1 TABLET TWICE A DAY 180 tablet 3   OXYGEN  Inhale 2 L/min into the lungs as needed (for shortness of breath).     pantoprazole  (PROTONIX ) 40 MG tablet TAKE 1 TABLET TWICE A DAY 180 tablet 3   polyethylene glycol (MIRALAX  / GLYCOLAX ) 17 g packet Take 17 g by mouth See admin instructions. Mix 17 grams into the suggested amount of water  and drink once a day- HOLD FOR DIARRHEA     simethicone (MYLICON) 125 MG chewable tablet Chew 125 mg by mouth every 6 (six) hours as needed for flatulence.     Tiotropium Bromide -Olodaterol (STIOLTO RESPIMAT ) 2.5-2.5 MCG/ACT AERS Inhale 2 puffs into the lungs daily. 4 g 11   vitamin B-12 (CYANOCOBALAMIN ) 1000 MCG tablet Take 1,000 mcg by mouth daily.      vitamin C  (ASCORBIC ACID ) 500 MG tablet Take 500 mg by mouth daily.     No current facility-administered medications for this visit.     Objective:  BP 122/68 (BP Location: Left Arm, Patient Position: Sitting, Cuff Size: Normal)   Pulse 68   Temp 97.6 F (36.4 C) (Temporal)   Ht 6' 3 (1.905 m)   Wt 173 lb 9.6 oz (78.7 kg)   BMI 21.70 kg/m  Gen: NAD, resting comfortably CV: RRR no murmurs rubs or gallops Lungs: CTAB no crackles, wheeze, rhonchi Ext: no edema Skin: warm, dry    Assessment and Plan   # Off-balance/recent fall # Memory loss S: Patient fell 2 days ago missing a step at front door- only 1 step up- didn't use the rail that was available.   In general feels off balance starting about a year ago since hospitalization.  He did not hit his head.  He did not lose consciousness. Some scrapes on arms.    There have been some reported memory issues for at least a few years off and on but worse this year.  Had reassuring  MRI of the brain April 18, 2023 but was already having memory issues at that time. Feels like his memory has been worse since hospitalization for pneumonia in June 2024. He stays in the house more and more. More withdrawn with memory changes A/P: balance issues with reassuring prior MRI since balance issues started -Strongly encouraged to use rails -encouraged using cane  Memory concern with 25/30 MMSE  - refer to  neurology for their opinion and they can weigh in on balance. No parkinsonian tremor reported but on diffierential -takes B12 but check levels, check TSH as well -hold off on repeat MRI now - offered RPR and HIV- HIV not covered despite trying 4 different icd10 codes. RPR processed though. He opts in  # Neuroendocrine carcinoma/lung cancer- appeared stable to clearing on last exam- has upcoming repeat evaluation in 11 days. Follow up in August with oncology  #Chronic kidney disease stage IIIb S: Patient with significant worsening in kidney function dating back to November 2020-we have actually written a letter to the [facility] explaining how cancer treatment affected kidney function.  Patient follows with [facility] -GFR typically in the 40s with creatinine of 1.4-1.7 range A/P: ongoing issues- continue to monitor- has upcoming labs with oncology   #Diabetes mellitus S: medication(s): glipizide  5 mg daily  -sparing sugar checks but no lows Lab Results  Component Value Date   HGBA1C 6.6 (A) 09/27/2023   HGBA1C 6.7 (H) 03/22/2023   HGBA1C 6.1 (A) 09/22/2022  A/P: reasonable control- likely continue current medications - with kidney changes cautious about metformin    #Hypertension  S: Compliant with amlodipine  10 mg and metoprolol  25 mg twice daily  BP Readings from Last 3 Encounters:  03/27/24 122/68  11/01/23 (!) 170/78  10/18/23 (!) 148/95  A/P: well controlled continue current medications    #hyperlipidemia S: Medication:none  Lab Results  Component Value Date   CHOL  199 03/22/2023   HDL 32.30 (L) 03/22/2023   LDLCALC 141 (H) 03/22/2023   LDLDIRECT 122.0 05/12/2019   TRIG 125.0 03/22/2023   CHOLHDL 6 03/22/2023   A/P: with emmeory changes unliekly sto start statin but update levels   #Gout S: Compliant with allopurinol  100 mg. Last flare 3-4 months ago.  Lab Results  Component Value Date   LABURIC 5.3 09/21/2020   A/P:  with flare in recent months update uric acid today- continue current medications for now unless over 6  Recommended follow up: Return in about 4 months (around 07/27/2024) for followup or sooner if needed.Schedule b4 you leave. Future Appointments  Date Time Provider Department Center  04/07/2024  9:00 AM [facility] None  04/07/2024 10:00 AM [facility] 2 [facility] [place]  04/07/2024 10:30 AM [facility] 2 [facility]   04/14/2024  9:00 AM Sherrod Sherrod, MD [facility] None  05/07/2024  3:15 PM Gaynel Delon CROME, DPM [facility]  01/12/2025 10:40 AM [facility] ANNUAL WELLNESS VISIT 1 [facility]    Lab/Order associations: NOT fasting   ICD-10-CM   1. Type 2 diabetes mellitus with stage 3b chronic kidney disease, without long-term current use of insulin  (HCC)  E11.22 Lipid panel   N18.32 Hemoglobin A1c    Microalbumin / creatinine urine ratio    2. Chronic kidney disease (CKD) stage G3b/A1, moderately decreased glomerular filtration rate (GFR) between 30-44 mL/min/1.73 square meter and albuminuria creatinine ratio less than 30 mg/g (HCC)  N18.32     3. Neuroendocrine carcinoma (HCC)  C7A.8     4. Memory loss  R41.3 TSH    Vitamin B12    Ambulatory referral to Neurology    5. Balance problem  R26.89 Ambulatory referral to Neurology    6. Idiopathic chronic gout without tophus, unspecified site  M1A.00X0 Uric acid      No orders of the defined types were placed in this encounter.   Return precautions advised.  Garnette Lukes, MD

## 2024-03-28 ENCOUNTER — Other Ambulatory Visit: Payer: Self-pay

## 2024-03-28 ENCOUNTER — Ambulatory Visit: Payer: Self-pay | Admitting: Family Medicine

## 2024-03-28 ENCOUNTER — Other Ambulatory Visit (HOSPITAL_COMMUNITY): Payer: Self-pay

## 2024-03-28 LAB — LIPID PANEL
Cholesterol: 200 mg/dL (ref 0–200)
HDL: 30.4 mg/dL — ABNORMAL LOW (ref >39.00)
LDL Cholesterol: 139 mg/dL — ABNORMAL HIGH (ref 0–99)
NonHDL: 170.02
Total CHOL/HDL Ratio: 7
Triglycerides: 157 mg/dL — ABNORMAL HIGH (ref 0.0–149.0)
VLDL: 31.4 mg/dL (ref 0.0–40.0)

## 2024-03-28 LAB — RPR: RPR Ser Ql: NONREACTIVE

## 2024-03-28 LAB — URIC ACID: Uric Acid, Serum: 5.8 mg/dL (ref 4.0–7.8)

## 2024-03-28 MED ORDER — ROSUVASTATIN CALCIUM 5 MG PO TABS
5.0000 mg | ORAL_TABLET | Freq: Every day | ORAL | 3 refills | Status: DC
Start: 2024-03-28 — End: 2024-07-17
  Filled 2024-03-28: qty 90, 90d supply, fill #0

## 2024-03-28 MED ORDER — METFORMIN HCL 500 MG PO TABS
500.0000 mg | ORAL_TABLET | Freq: Every day | ORAL | 3 refills | Status: DC
  Filled 2024-03-28: qty 90, 90d supply, fill #0

## 2024-03-31 ENCOUNTER — Other Ambulatory Visit (HOSPITAL_COMMUNITY): Payer: Self-pay

## 2024-04-03 ENCOUNTER — Encounter: Payer: Self-pay | Admitting: Physician Assistant

## 2024-04-07 ENCOUNTER — Encounter (HOSPITAL_COMMUNITY): Payer: Self-pay

## 2024-04-07 ENCOUNTER — Inpatient Hospital Stay: Payer: Medicare Other | Attending: Internal Medicine

## 2024-04-07 ENCOUNTER — Other Ambulatory Visit: Payer: Self-pay | Admitting: Internal Medicine

## 2024-04-07 ENCOUNTER — Ambulatory Visit (HOSPITAL_COMMUNITY): Admission: RE | Admit: 2024-04-07 | Source: Ambulatory Visit

## 2024-04-07 ENCOUNTER — Ambulatory Visit (HOSPITAL_COMMUNITY)
Admission: RE | Admit: 2024-04-07 | Discharge: 2024-04-07 | Disposition: A | Discharge status: Home / Self Care | Source: Ambulatory Visit | Attending: Internal Medicine | Admitting: Internal Medicine

## 2024-04-07 DIAGNOSIS — R1310 Dysphagia, unspecified: Secondary | ICD-10-CM | POA: Diagnosis not present

## 2024-04-07 DIAGNOSIS — C7A8 Other malignant neuroendocrine tumors: Secondary | ICD-10-CM | POA: Insufficient documentation

## 2024-04-07 DIAGNOSIS — J432 Centrilobular emphysema: Secondary | ICD-10-CM | POA: Diagnosis not present

## 2024-04-07 DIAGNOSIS — C7A090 Malignant carcinoid tumor of the bronchus and lung: Secondary | ICD-10-CM | POA: Insufficient documentation

## 2024-04-07 DIAGNOSIS — C7B02 Secondary carcinoid tumors of liver: Secondary | ICD-10-CM | POA: Diagnosis not present

## 2024-04-07 DIAGNOSIS — I700 Atherosclerosis of aorta: Secondary | ICD-10-CM | POA: Diagnosis not present

## 2024-04-07 DIAGNOSIS — Z79899 Other long term (current) drug therapy: Secondary | ICD-10-CM | POA: Diagnosis not present

## 2024-04-07 LAB — CBC WITH DIFFERENTIAL (CANCER CENTER ONLY)
Abs Immature Granulocytes: 0.02 K/uL (ref 0.00–0.07)
Basophils Absolute: 0.1 K/uL (ref 0.0–0.1)
Basophils Relative: 2 %
Eosinophils Absolute: 0.1 K/uL (ref 0.0–0.5)
Eosinophils Relative: 1 %
HCT: 41.9 % (ref 39.0–52.0)
Hemoglobin: 14.1 g/dL (ref 13.0–17.0)
Immature Granulocytes: 0 %
Lymphocytes Relative: 20 %
Lymphs Abs: 1.1 K/uL (ref 0.7–4.0)
MCH: 29.3 pg (ref 26.0–34.0)
MCHC: 33.7 g/dL (ref 30.0–36.0)
MCV: 87.1 fL (ref 80.0–100.0)
Monocytes Absolute: 0.4 K/uL (ref 0.1–1.0)
Monocytes Relative: 7 %
Neutro Abs: 3.6 K/uL (ref 1.7–7.7)
Neutrophils Relative %: 70 %
Platelet Count: 188 K/uL (ref 150–400)
RBC: 4.81 MIL/uL (ref 4.22–5.81)
RDW: 16.2 % — ABNORMAL HIGH (ref 11.5–15.5)
WBC Count: 5.2 K/uL (ref 4.0–10.5)
nRBC: 0 % (ref 0.0–0.2)

## 2024-04-07 LAB — CMP (CANCER CENTER ONLY)
ALT: 11 U/L (ref 0–44)
AST: 13 U/L — ABNORMAL LOW (ref 15–41)
Albumin: 4.2 g/dL (ref 3.5–5.0)
Alkaline Phosphatase: 79 U/L (ref 38–126)
Anion gap: 8 (ref 5–15)
BUN: 22 mg/dL (ref 8–23)
CO2: 27 mmol/L (ref 22–32)
Calcium: 9.2 mg/dL (ref 8.9–10.3)
Chloride: 103 mmol/L (ref 98–111)
Creatinine: 1.71 mg/dL — ABNORMAL HIGH (ref 0.61–1.24)
GFR, Estimated: 40 mL/min — ABNORMAL LOW (ref >60)
Glucose, Bld: 161 mg/dL — ABNORMAL HIGH (ref 70–99)
Potassium: 3.9 mmol/L (ref 3.5–5.1)
Sodium: 138 mmol/L (ref 135–145)
Total Bilirubin: 0.4 mg/dL (ref 0.0–1.2)
Total Protein: 8 g/dL (ref 6.5–8.1)

## 2024-04-09 LAB — CHROMOGRANIN A: Chromogranin A (ng/mL): 1690 ng/mL — ABNORMAL HIGH (ref 0.0–101.8)

## 2024-04-14 ENCOUNTER — Inpatient Hospital Stay (HOSPITAL_BASED_OUTPATIENT_CLINIC_OR_DEPARTMENT_OTHER): Payer: Medicare Other | Admitting: Internal Medicine

## 2024-04-14 ENCOUNTER — Encounter: Payer: Self-pay | Admitting: Medical Oncology

## 2024-04-14 VITALS — BP 159/86 | HR 73 | Temp 97.9°F | Resp 17 | Ht 75.0 in | Wt 175.0 lb

## 2024-04-14 DIAGNOSIS — C7B02 Secondary carcinoid tumors of liver: Secondary | ICD-10-CM | POA: Diagnosis not present

## 2024-04-14 DIAGNOSIS — C7B8 Other secondary neuroendocrine tumors: Secondary | ICD-10-CM

## 2024-04-14 DIAGNOSIS — R1310 Dysphagia, unspecified: Secondary | ICD-10-CM | POA: Diagnosis not present

## 2024-04-14 DIAGNOSIS — Z79899 Other long term (current) drug therapy: Secondary | ICD-10-CM | POA: Diagnosis not present

## 2024-04-14 DIAGNOSIS — C7A8 Other malignant neuroendocrine tumors: Secondary | ICD-10-CM | POA: Diagnosis not present

## 2024-04-14 DIAGNOSIS — C7A090 Malignant carcinoid tumor of the bronchus and lung: Secondary | ICD-10-CM | POA: Diagnosis not present
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
## 2024-04-15 ENCOUNTER — Telehealth: Payer: Self-pay | Admitting: Internal Medicine

## 2024-04-15 NOTE — Telephone Encounter (Signed)
 Scheduled appointments. Left the patient a voicemail with appointment details.

## 2024-05-07 ENCOUNTER — Ambulatory Visit (INDEPENDENT_AMBULATORY_CARE_PROVIDER_SITE_OTHER): Admitting: Podiatry

## 2024-05-07 DIAGNOSIS — M79675 Pain in left toe(s): Secondary | ICD-10-CM | POA: Diagnosis not present

## 2024-05-07 DIAGNOSIS — E1151 Type 2 diabetes mellitus with diabetic peripheral angiopathy without gangrene: Secondary | ICD-10-CM

## 2024-05-07 DIAGNOSIS — M79674 Pain in right toe(s): Secondary | ICD-10-CM

## 2024-05-07 DIAGNOSIS — B351 Tinea unguium: Secondary | ICD-10-CM | POA: Diagnosis not present

## 2024-05-11 ENCOUNTER — Encounter: Payer: Self-pay | Admitting: Podiatry

## 2024-05-11 NOTE — Progress Notes (Signed)
  Subjective:  Patient ID: Richard Navarro, male    DOB: Apr 29, 1946,  MRN: [id number]  Richard Navarro presents to clinic today for at risk foot care. Pt has h/o NIDDM with PAD and painful mycotic toenails of both feet that are difficult to trim. Pain interferes with daily activities and wearing enclosed shoe gear comfortably.  Chief Complaint  Patient presents with   Diabetes    [facility] Diet control diabetes. A1C 7.6. LOV with PCP 03/2024.   New problem(s): None.   PCP is Katrinka Garnette KIDD, MD.  Allergies  Allergen Reactions   Afinitor  [Everolimus ] Swelling and Other (See Comments)    Angioedema- Patient was on this and Lisinopril  at the same time; had no trouble breathing, but throat became swollen   Lisinopril  Swelling and Other (See Comments)    Angioedema- on this and afinitor  same time; had no trouble breathing, but throat became swollen   Other Other (See Comments)    Certain seafoods cause gout flares   Rosuvastatin  Other (See Comments)    chills and muscle aches   Statins Other (See Comments)    Joint pain   Simvastatin Other (See Comments)    Joint pain    Review of Systems: Negative except as noted in the HPI.  Objective: No changes noted in today's physical examination. There were no vitals filed for this visit. Richard Navarro is a pleasant 78 y.o. male thin build in NAD. AAO x 3.  Vascular Examination: Vascular status intact b/l with faintly palpable pedal pulses. CFT immediate b/l. Pedal hair absent. No edema. No pain with calf compression b/l. Skin temperature gradient WNL b/l. No varicosities noted. No cyanosis or clubbing noted.  Neurological Examination: Sensation grossly intact b/l with 10 gram monofilament. Vibratory sensation intact b/l.  Dermatological Examination: Pedal skin  thin and atrophic b/l. No open wounds nor interdigital macerations noted. Toenails 1-5 b/l thick, discolored, elongated with subungual debris and pain on dorsal palpation. No  hyperkeratotic lesions noted b/l.   Musculoskeletal Examination: Muscle strength 5/5 to b/l LE.  No pain, crepitus noted b/l. Clawtoe 2-5 b/l. Patient ambulates independently without assistive aids.   Radiographs: None  Assessment/Plan: 1. Pain due to onychomycosis of toenails of both feet   2. Type II diabetes mellitus with peripheral circulatory disorder [facility])    Consent given for treatment. Patient examined. All patient's and/or POA's questions/concerns addressed on today's visit. Toenails 1-5 debrided in length and girth without incident. Treatment was provided by assistant Andrez Manchester under my supervision. Continue foot and shoe inspections daily. Monitor blood glucose per PCP/Endocrinologist's recommendations. Continue soft, supportive shoe gear daily. Report any pedal injuries to medical professional. Call office if there are any questions/concerns.  Return in about 3 months (around 08/06/2024).  Richard Navarro, DPM      [place] LOCATION: [address], [place] [address]                   Office [phone]   [facility] LOCATION: [address] [place], [place] [address] Office [phone]

## 2024-05-12 ENCOUNTER — Other Ambulatory Visit (HOSPITAL_COMMUNITY): Payer: Self-pay

## 2024-05-12 MED ORDER — COMIRNATY 30 MCG/0.3ML IM SUSY
0.3000 mL | PREFILLED_SYRINGE | Freq: Once | INTRAMUSCULAR | 0 refills | Status: AC
  Filled 2024-05-12: qty 0.3, 1d supply, fill #0

## 2024-05-12 MED FILL — Allopurinol Tab 100 MG: ORAL | 90 days supply | Qty: 90 | Fill #0 | Status: AC

## 2024-06-05 DIAGNOSIS — I129 Hypertensive chronic kidney disease with stage 1 through stage 4 chronic kidney disease, or unspecified chronic kidney disease: Secondary | ICD-10-CM | POA: Diagnosis not present

## 2024-06-05 DIAGNOSIS — C7A8 Other malignant neuroendocrine tumors: Secondary | ICD-10-CM | POA: Diagnosis not present

## 2024-06-05 DIAGNOSIS — N1832 Chronic kidney disease, stage 3b: Secondary | ICD-10-CM | POA: Diagnosis not present

## 2024-06-05 DIAGNOSIS — D631 Anemia in chronic kidney disease: Secondary | ICD-10-CM | POA: Diagnosis not present

## 2024-06-17 ENCOUNTER — Encounter: Payer: Self-pay | Admitting: Podiatry

## 2024-06-17 ENCOUNTER — Ambulatory Visit (INDEPENDENT_AMBULATORY_CARE_PROVIDER_SITE_OTHER): Admitting: Podiatry

## 2024-06-17 DIAGNOSIS — M2042 Other hammer toe(s) (acquired), left foot: Secondary | ICD-10-CM | POA: Diagnosis not present

## 2024-06-17 DIAGNOSIS — R0989 Other specified symptoms and signs involving the circulatory and respiratory systems: Secondary | ICD-10-CM | POA: Diagnosis not present

## 2024-06-18 NOTE — Progress Notes (Signed)
 He presents today to see me for a painful ingrown toenail second digit left foot.  States that has been like this for quite some time the tip of the toe really pushes in on the nail and walking is hard and painful.  Objective: Vital signs are stable alert and oriented x 3 pulses are nonpalpable.  Capillary fill time is immediate does relate fatigue in his legs.  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  Assessment: Peripheral vascular disease note actual ingrown nail just pressure at the distal aspect of the second digit due to the hammertoe deformity.  Plan: Currently we are requesting ABIs to be performed prior to any type of surgical correction.

## 2024-06-19 ENCOUNTER — Other Ambulatory Visit (HOSPITAL_COMMUNITY): Payer: Self-pay

## 2024-06-19 DIAGNOSIS — Z23 Encounter for immunization: Secondary | ICD-10-CM | POA: Diagnosis not present

## 2024-06-19 MED ORDER — FLUZONE HIGH-DOSE 0.5 ML IM SUSY
0.5000 mL | PREFILLED_SYRINGE | Freq: Once | INTRAMUSCULAR | 0 refills | Status: AC
  Filled 2024-06-19: qty 0.5, 1d supply, fill #0

## 2024-06-27 ENCOUNTER — Ambulatory Visit (HOSPITAL_COMMUNITY)
Admission: RE | Admit: 2024-06-27 | Discharge: 2024-06-27 | Disposition: A | Discharge status: Home / Self Care | Source: Ambulatory Visit | Attending: Podiatry | Admitting: Podiatry

## 2024-06-27 DIAGNOSIS — R0989 Other specified symptoms and signs involving the circulatory and respiratory systems: Secondary | ICD-10-CM | POA: Insufficient documentation

## 2024-06-29 LAB — VAS US ABI WITH/WO TBI
Left ABI: 0.93
Right ABI: 1.01

## 2024-07-04 NOTE — Progress Notes (Signed)
 Assessment/Plan:     Richard Navarro is a very pleasant 78 y.o. 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 MoCA today is 22/ 30.  Etiology is unclear, likely multifactorial , workup is in progress.  Patient is able to participate on ADLs with some assistance.     Memory Impairment of unclear etiology, likely multifactorial   MRI brain without contrast to assess for underlying structural abnormality and assess vascular load  Neurocognitive testing to further evaluate cognitive concerns and determine other underlying cause of memory changes, including potential contribution from sleep, anxiety, Recommend good control of cardiovascular risk factors.   Continue to control mood as per PCP Recommend hearing evaluation to improve comprehension Patient declines PT for strength and balance Fol[place]ow up in  3 months   Subjective:    The patient is accompanied by his ex-wife who supplements  the history.    How long did patient have memory difficulties?  For about 1 to 2 years, worse since his hospitalization for pneumonia in June 2024.   Patient reports some difficulty remembering new information, recent conversations, names. LTM is good. If stressed he will not pay attention, concentrate or communicate. repeats oneself?  Endorsed Disoriented when walking into a room? Denies    Leaving objects in unusual places?  Denies.   Wandering behavior? Denies.  He stays increasingly more inside the house. Any personality changes, or depression, anxiety?  Increased moments of irritability, depression and anxiety.  He feels more withdrawn due to memory changes and what it comes with aging.  Over the last 7 years I have been losing members of the family,  so the social interaction has decreased.  Hallucinations or paranoia? Occasional, I see myself going to [place], when things are tough. Had psychotherapy for a while but  discontinued when the they cut the class I as going to Seizures? Denies.    Any sleep changes?   Does not sleep well does not want to take any medicines.  He may fall asleep during the day. Seldom he has nightmares, but may dream about falling or dream reenactment, without other REM behavior or sleepwalking   Sleep apnea? Denies.   Any hygiene concerns?  Denies.   Independent of bathing and dressing? Endorsed  Does the patient need help with medications? Patient  is in charge   Who is in charge of the finances? Patient  is in charge    Any changes in appetite?   Denies. Takes Glucerna.   Patient have trouble swallowing?  Denies.   Does the patient cook? No  Any headaches?  Denies.   Chronic pain? Denies.   Ambulates with difficulty? Slower, weaker than before  has not been doing  any PT, declines.  Recent falls or head injuries?  August 2025 the patient missed a step fell at the front door, without head injury or LOC.  He does report feeling off balance because of my diabetes for the last 5 years. Vision changes?  Denies any new issues.  Any strokelike symptoms? Denies.  Had a heatstroke in [place] says.  Any tremors? Denies.   Any anosmia? Denies.   Any incontinence of urine? Denies.   Any bowel dysfunction? Denies.      Patient lives with his ex wife  History of heavy  alcohol intake? Denies.   History of heavy tobacco use? Denies.   Family history of dementia?  Brother, mother and father had dementia of ? type Does patient drive? Yes, but not as much anymore. Occasionally may get lost.    Retired chief executive officer   Allergies  Allergen Reactions   Afinitor  [Everolimus ] Swelling and Other (See Comments)    Angioedema- Patient was on this and Lisinopril  at the same time; had no trouble breathing,  but throat became swollen   Lisinopril  Swelling and Other (See Comments)    Angioedema- on this and afinitor  same time; had no trouble breathing, but throat became swollen   Other Other (See Comments)    Certain seafoods cause gout flares   Rosuvastatin  Other (See Comments)    chills and muscle aches   Statins Other (See Comments)    Joint pain   Simvastatin Other (See Comments)    Joint pain    Current Outpatient Medications  Medication Instructions   albuterol  (VENTOLIN  HFA) 108 (90 Base) MCG/ACT inhaler 2 puffs, Inhalation, Every 4 hours PRN   allopurinol  (ZYLOPRIM ) 100 MG tablet TAKE 1 TABLET DAILY   amLODipine  (NORVASC ) 10 mg, Oral, Daily   ascorbic acid  (VITAMIN C ) 500 mg, Daily   Continuous Blood Gluc Receiver (DEXCOM G7 RECEIVER) DEVI Please provide 1 receiver   Continuous Blood Gluc Sensor (DEXCOM G7 SENSOR) MISC 1 Box, Does not apply, Every 30 days   cyanocobalamin  (VITAMIN B12) 1,000 mcg, Daily   docusate sodium  (COLACE) 300 mg, Daily   feeding supplement, GLUCERNA SHAKE, (GLUCERNA SHAKE) LIQD 237 mLs, 2 times daily PRN   GAVISCON EXTRA STRENGTH 160-105 MG CHEW 1 tablet, Every 6 hours PRN   glipiZIDE  (GLUCOTROL ) 5 mg, Oral, Daily before breakfast   glucose blood test strip Use to test blood sugar twice a day   guaiFENesin  (MUCINEX ) 600 mg, 2 times daily   Iron 325 mg, See admin instructions   ketotifen (ZADITOR) 0.025 % ophthalmic solution 1 drop, 2 times daily PRN   magnesium  hydroxide (MILK OF MAGNESIA) 400 MG/5ML suspension 30 mLs, Daily PRN   metFORMIN  (GLUCOPHAGE ) 500 mg, Oral, Daily with breakfast   metoprolol  tartrate (LOPRESSOR ) 25 mg, Oral, 2 times daily   OXYGEN  2 L/min, As needed   pantoprazole  (PROTONIX ) 40 mg, Oral, 2 times daily   polyethylene glycol (MIRALAX  / GLYCOLAX ) 17 g, See admin instructions   rosuvastatin  (CRESTOR ) 5 mg, Oral, Daily   simethicone (MYLICON) 125 mg, Every 6 hours PRN   Tiotropium Bromide -Olodaterol (STIOLTO RESPIMAT ) 2.5-2.5  MCG/ACT AERS 2 puffs, Inhalation, Daily   Vitamin D3 1,000 Units, Daily     VITALS:  There were no vitals filed for this visit.   Neurological Exam     07/08/2024    2:00 PM  Montreal Cognitive Assessment   Visuospatial/ Executive (0/5) 4  Naming (0/3) 3  Attention: Read list of digits (0/2) 2  Attention: Read list of letters (0/1) 1  Attention: Serial 7 subtraction starting at 100 (0/3) 2  Language: Repeat phrase (0/2) 2  Language : Fluency (0/1) 0  Abstraction (0/2) 1  Delayed Recall (0/5) 0  Orientation (0/6) 6  Total 21  Adjusted Score (based on education) 22       03/28/2024    1:37 PM 12/13/2017    4:53 PM 09/21/2016    4:18 PM  MMSE - Mini Mental State Exam  Not completed:  -- --  Orientation to time 4    Orientation to Place 5  Registration 3    Attention/ Calculation 5    Recall 0    Language- name 2 objects 2    Language- repeat 1    Language- follow 3 step command 3    Language- read & follow direction 1    Write a sentence 0    Copy design 1    Total score 25         Orientation:  Alert and oriented to person, place and not to time . No aphasia or dysarthria. Fund of knowledge is appropriate. Recent and remote memory impaired.  Attention and concentration are reduced .  Able to name objects and repeat phrases 1/2.   Delayed recall  0/5 .  Cranial nerves: There is good facial symmetry. Extraocular muscles are intact and visual fields are full to confrontational testing. Speech is fluent and clear. No tongue deviation. Hearing is decreased to conversational tone.  Tone: Tone is good throughout. Sensation: Sensation is intact to light touch.  Vibration is intact at the bilateral big toe.  Coordination: The patient has no difficulty with RAM's or FNF bilaterally. Normal finger to nose  Motor: Strength is 5/5 in the bilateral upper and lower extremities. There is no pronator drift. There are no fasciculations noted. DTR's: Deep tendon reflexes are 2/4  bilaterally. Gait and Station: The patient is able to ambulate with some difficulty. Gait is cautious and narrow. Stride length is short        Thank you for allowing us  the opportunity to participate in the care of this nice patient. Please do not hesitate to contact us  for any questions or concerns.   Total time spent on today's visit was 50 minutes dedicated to this patient today, preparing to see patient, examining the patient, ordering tests and/or medications and counseling the patient, documenting clinical information in the EHR or other health record, independently interpreting results and communicating results to the patient/family, discussing treatment and goals, answering patient's questions and coordinating care.  Cc:  Katrinka Garnette KIDD, MD  Camie Sevin 07/08/2024 2:26 PM

## 2024-07-08 ENCOUNTER — Ambulatory Visit (INDEPENDENT_AMBULATORY_CARE_PROVIDER_SITE_OTHER): Admitting: Physician Assistant

## 2024-07-08 ENCOUNTER — Ambulatory Visit: Payer: Self-pay | Admitting: Podiatry

## 2024-07-08 ENCOUNTER — Encounter: Payer: Self-pay | Admitting: Physician Assistant

## 2024-07-08 ENCOUNTER — Ambulatory Visit

## 2024-07-08 VITALS — BP 149/80 | HR 67 | Resp 20

## 2024-07-08 DIAGNOSIS — R413 Other amnesia: Secondary | ICD-10-CM | POA: Diagnosis not present

## 2024-07-08 NOTE — Patient Instructions (Addendum)
 It was a pleasure to see you today at our office.   Recommendations:  Neurocognitive evaluation at our office   MRI of the brain, the radiology office will call you to arrange you appointment  at [facility] [phone]    Follow up in  3months     [email or web address]   RECOMMENDATIONS FOR ALL PATIENTS WITH MEMORY PROBLEMS: 1. Continue to exercise (Recommend 30 minutes of walking everyday, or 3 hours every week) 2. Increase social interactions - continue going to [place] and enjoy social gatherings with friends and family 3. Eat healthy, avoid fried foods and eat more fruits and vegetables 4. Maintain adequate blood pressure, blood sugar, and blood cholesterol level. Reducing the risk of stroke and cardiovascular disease also helps promoting better memory. 5. Avoid stressful situations. Live a simple life and avoid aggravations. Organize your time and prepare for the next day in anticipation. 6. Sleep well, avoid any interruptions of sleep and avoid any distractions in the bedroom that may interfere with adequate sleep quality 7. Avoid sugar, avoid sweets as there is a strong link between excessive sugar intake, diabetes, and cognitive impairment We discussed the Mediterranean diet, which has been shown to help patients reduce the risk of progressive memory disorders and reduces cardiovascular risk. This includes eating fish, eat fruits and green leafy vegetables, nuts like almonds and hazelnuts, walnuts, and also use olive oil. Avoid fast foods and fried foods as much as possible. Avoid sweets and sugar as sugar use has been linked to worsening of memory function.  There is always a concern of gradual progression of memory problems. If this is the case, then we may need to adjust level of care according to patient needs. Support, both to the patient and caregiver, should then be put into place.      You have been referred for  a neuropsychological evaluation (i.e., evaluation of memory and thinking abilities). Please bring someone with you to this appointment if possible, as it is helpful for the doctor to hear from both you and another adult who knows you well. Please bring eyeglasses and hearing aids if you wear them.    The evaluation will take approximately 3 hours and has two parts:   The first part is a clinical interview with the neuropsychologist (Dr. Richie or Dr. Gayland). During the interview, the neuropsychologist will speak with you and the individual you brought to the appointment.    The second part of the evaluation is testing with the doctor's technician Neal or Luke). During the testing, the technician will ask you to remember different types of material, solve problems, and answer some questionnaires. Your family member will not be present for this portion of the evaluation.   Please note: We must reserve several hours of the neuropsychologist's time and the psychometrician's time for your evaluation appointment. As such, there is a No-Show fee of $100. If you are unable to attend any of your appointments, please contact our office as soon as possible to reschedule.      DRIVING: Regarding driving, in patients with progressive memory problems, driving will be impaired. We advise to have someone else do the driving if trouble finding directions or if minor accidents are reported. Independent driving assessment is available to determine safety of driving.   If you are interested in the driving assessment, you can contact the following:  [facility] [employer] [facility] in [place] [phone]  [facility] [phone]  [facility] [phone]  [facility] [phone] or  [phone]   FALL PRECAUTIONS: Be cautious when walking. Scan the area for obstacles that may increase the risk of trips and falls. When getting up in the mornings, sit up at the edge of the bed for  a few minutes before getting out of bed. Consider elevating the bed at the head end to avoid drop of blood pressure when getting up. Walk always in a well-lit room (use night lights in the walls). Avoid area rugs or power cords from appliances in the middle of the walkways. Use a walker or a cane if necessary and consider physical therapy for balance exercise. Get your eyesight checked regularly.  FINANCIAL OVERSIGHT: Supervision, especially oversight when making financial decisions or transactions is also recommended.  HOME SAFETY: Consider the safety of the kitchen when operating appliances like stoves, microwave oven, and blender. Consider having supervision and share cooking responsibilities until no longer able to participate in those. Accidents with firearms and other hazards in the house should be identified and addressed as well.   ABILITY TO BE LEFT ALONE: If patient is unable to contact 911 operator, consider using LifeLine, or when the need is there, arrange for someone to stay with patients. Smoking is a fire hazard, consider supervision or cessation. Risk of wandering should be assessed by caregiver and if detected at any point, supervision and safe proof recommendations should be instituted.  MEDICATION SUPERVISION: Inability to self-administer medication needs to be constantly addressed. Implement a mechanism to ensure safe administration of the medications.      Mediterranean Diet A Mediterranean diet refers to food and lifestyle choices that are based on the traditions of countries located on the [employer] [place]. This way of eating has been shown to help prevent certain conditions and improve outcomes for people who have chronic diseases, like kidney disease and heart disease. What are tips for following this plan? Lifestyle  Cook and eat meals together with your family, when possible. Drink enough fluid to keep your urine clear or pale yellow. Be physically active every day.  This includes: Aerobic exercise like running or swimming. Leisure activities like gardening, walking, or housework. Get 7-8 hours of sleep each night. If recommended by your health care provider, drink red wine in moderation. This means 1 glass a day for nonpregnant women and 2 glasses a day for men. A glass of wine equals 5 oz (150 mL). Reading food labels  Check the serving size of packaged foods. For foods such as rice and pasta, the serving size refers to the amount of cooked product, not dry. Check the total fat in packaged foods. Avoid foods that have saturated fat or trans fats. Check the ingredients list for added sugars, such as corn syrup. Shopping  At the grocery store, buy most of your food from the areas near the walls of the store. This includes: Fresh fruits and vegetables (produce). Grains, beans, nuts, and seeds. Some of these may be available in unpackaged forms or large amounts (in bulk). Fresh seafood. Poultry and eggs. Low-fat dairy products. Buy whole ingredients instead of prepackaged foods. Buy fresh fruits and vegetables in-season from local farmers markets. Buy frozen fruits and vegetables in resealable bags. If you do not have access to quality fresh seafood, buy precooked frozen shrimp or canned fish, such as tuna, salmon, or sardines. Buy small amounts of raw or cooked vegetables, salads, or olives from the deli or salad bar at your store. Stock your pantry so you always have certain foods on hand, such  as olive oil, canned tuna, canned tomatoes, rice, pasta, and beans. Cooking  Cook foods with extra-virgin olive oil instead of using butter or other vegetable oils. Have meat as a side dish, and have vegetables or grains as your main dish. This means having meat in small portions or adding small amounts of meat to foods like pasta or stew. Use beans or vegetables instead of meat in common dishes like chili or lasagna. Experiment with different cooking methods.  Try roasting or broiling vegetables instead of steaming or sauteing them. Add frozen vegetables to soups, stews, pasta, or rice. Add nuts or seeds for added healthy fat at each meal. You can add these to yogurt, salads, or vegetable dishes. Marinate fish or vegetables using olive oil, lemon juice, garlic, and fresh herbs. Meal planning  Plan to eat 1 vegetarian meal one day each week. Try to work up to 2 vegetarian meals, if possible. Eat seafood 2 or more times a week. Have healthy snacks readily available, such as: Vegetable sticks with hummus. Greek yogurt. Fruit and nut trail mix. Eat balanced meals throughout the week. This includes: Fruit: 2-3 servings a day Vegetables: 4-5 servings a day Low-fat dairy: 2 servings a day Fish, poultry, or lean meat: 1 serving a day Beans and legumes: 2 or more servings a week Nuts and seeds: 1-2 servings a day Whole grains: 6-8 servings a day Extra-virgin olive oil: 3-4 servings a day Limit red meat and sweets to only a few servings a month What are my food choices? Mediterranean diet Recommended Grains: Whole-grain pasta. Brown rice. Bulgar wheat. Polenta. Couscous. Whole-wheat bread. [place] Made[place]. Vegetables: Artichokes. Beets. Broccoli. Cabbage. Carrots. Eggplant. Green beans. Chard. Kale. Spinach. Onions. Leeks. Peas. Squash. Tomatoes. Peppers. Radishes. Fruits: Apples. Apricots. Avocado. Berries. Bananas. Cherries. Dates. Figs. Grapes. Lemons. Melon. Oranges. Peaches. Plums. Pomegranate. Meats and other protein foods: Beans. Almonds. Sunflower seeds. Pine nuts. Peanuts. Cod. Salmon. Scallops. Shrimp. Tuna. Tilapia. Clams. Oysters. Eggs. Dairy: Low-fat milk. Cheese. Greek yogurt. Beverages: Water . Red wine. Herbal tea. Fats and oils: Extra virgin olive oil. Avocado oil. Grape seed oil. Sweets and desserts: Greek yogurt with honey. Baked apples. Poached pears. Trail mix. Seasoning and other foods: Basil. Cilantro. Coriander. Cumin. Mint.  Parsley. Sage. Rosemary. Tarragon. Garlic. Oregano. Thyme. Pepper. Balsalmic vinegar. Tahini. Hummus. Tomato sauce. Olives. Mushrooms. Limit these Grains: Prepackaged pasta or rice dishes. Prepackaged cereal with added sugar. Vegetables: Deep fried potatoes (french fries). Fruits: Fruit canned in syrup. Meats and other protein foods: Beef. Pork. Lamb. Poultry with skin. Hot dogs. [place]. Dairy: Ice cream. Sour cream. Whole milk. Beverages: Juice. Sugar-sweetened soft drinks. Beer. Liquor and spirits. Fats and oils: Butter. Canola oil. Vegetable oil. Beef fat (tallow). Lard. Sweets and desserts: Cookies. Cakes. Pies. Candy. Seasoning and other foods: Mayonnaise. Premade sauces and marinades. The items listed may not be a complete list. Talk with your dietitian about what dietary choices are right for you. Summary The Mediterranean diet includes both food and lifestyle choices. Eat a variety of fresh fruits and vegetables, beans, nuts, seeds, and whole grains. Limit the amount of red meat and sweets that you eat. Talk with your health care provider about whether it is safe for you to drink red wine in moderation. This means 1 glass a day for nonpregnant women and 2 glasses a day for men. A glass of wine equals 5 oz (150 mL). This information is not intended to replace advice given to you by your health care provider. Make sure you discuss  any questions you have with your health care provider. Document Released: 03/30/2016 Document Revised: 05/02/2016 Document Reviewed: 03/30/2016 Elsevier Interactive Patient Education  2017 [employer].

## 2024-07-16 ENCOUNTER — Emergency Department (HOSPITAL_COMMUNITY)

## 2024-07-16 ENCOUNTER — Encounter (HOSPITAL_COMMUNITY): Payer: Self-pay

## 2024-07-16 ENCOUNTER — Observation Stay (HOSPITAL_COMMUNITY)
Admission: EM | Admit: 2024-07-16 | Discharge: 2024-07-17 | Disposition: A | Discharge status: Home / Self Care | Attending: Internal Medicine | Admitting: Internal Medicine

## 2024-07-16 ENCOUNTER — Other Ambulatory Visit: Payer: Self-pay

## 2024-07-16 DIAGNOSIS — E1165 Type 2 diabetes mellitus with hyperglycemia: Secondary | ICD-10-CM | POA: Insufficient documentation

## 2024-07-16 DIAGNOSIS — J449 Chronic obstructive pulmonary disease, unspecified: Secondary | ICD-10-CM | POA: Insufficient documentation

## 2024-07-16 DIAGNOSIS — R296 Repeated falls: Secondary | ICD-10-CM | POA: Diagnosis not present

## 2024-07-16 DIAGNOSIS — I2699 Other pulmonary embolism without acute cor pulmonale: Secondary | ICD-10-CM | POA: Diagnosis not present

## 2024-07-16 DIAGNOSIS — S0990XA Unspecified injury of head, initial encounter: Secondary | ICD-10-CM | POA: Diagnosis not present

## 2024-07-16 DIAGNOSIS — N1832 Chronic kidney disease, stage 3b: Secondary | ICD-10-CM | POA: Diagnosis not present

## 2024-07-16 DIAGNOSIS — R9082 White matter disease, unspecified: Secondary | ICD-10-CM | POA: Diagnosis not present

## 2024-07-16 DIAGNOSIS — I2693 Single subsegmental pulmonary embolism without acute cor pulmonale: Principal | ICD-10-CM | POA: Insufficient documentation

## 2024-07-16 DIAGNOSIS — I129 Hypertensive chronic kidney disease with stage 1 through stage 4 chronic kidney disease, or unspecified chronic kidney disease: Secondary | ICD-10-CM | POA: Insufficient documentation

## 2024-07-16 DIAGNOSIS — R509 Fever, unspecified: Secondary | ICD-10-CM | POA: Diagnosis not present

## 2024-07-16 DIAGNOSIS — R109 Unspecified abdominal pain: Secondary | ICD-10-CM | POA: Diagnosis not present

## 2024-07-16 DIAGNOSIS — J189 Pneumonia, unspecified organism: Secondary | ICD-10-CM | POA: Insufficient documentation

## 2024-07-16 DIAGNOSIS — E1122 Type 2 diabetes mellitus with diabetic chronic kidney disease: Secondary | ICD-10-CM | POA: Diagnosis not present

## 2024-07-16 DIAGNOSIS — S32030A Wedge compression fracture of third lumbar vertebra, initial encounter for closed fracture: Secondary | ICD-10-CM | POA: Diagnosis not present

## 2024-07-16 DIAGNOSIS — Z79899 Other long term (current) drug therapy: Secondary | ICD-10-CM | POA: Diagnosis not present

## 2024-07-16 DIAGNOSIS — R0902 Hypoxemia: Secondary | ICD-10-CM | POA: Diagnosis not present

## 2024-07-16 DIAGNOSIS — N179 Acute kidney failure, unspecified: Secondary | ICD-10-CM | POA: Insufficient documentation

## 2024-07-16 DIAGNOSIS — K449 Diaphragmatic hernia without obstruction or gangrene: Secondary | ICD-10-CM | POA: Diagnosis not present

## 2024-07-16 DIAGNOSIS — Z87891 Personal history of nicotine dependence: Secondary | ICD-10-CM | POA: Diagnosis not present

## 2024-07-16 DIAGNOSIS — M47816 Spondylosis without myelopathy or radiculopathy, lumbar region: Secondary | ICD-10-CM | POA: Diagnosis not present

## 2024-07-16 DIAGNOSIS — J9611 Chronic respiratory failure with hypoxia: Secondary | ICD-10-CM | POA: Diagnosis not present

## 2024-07-16 DIAGNOSIS — R140 Abdominal distension (gaseous): Secondary | ICD-10-CM | POA: Diagnosis not present

## 2024-07-16 DIAGNOSIS — R000 Tachycardia, unspecified: Secondary | ICD-10-CM | POA: Diagnosis not present

## 2024-07-16 DIAGNOSIS — K769 Liver disease, unspecified: Secondary | ICD-10-CM | POA: Diagnosis not present

## 2024-07-16 DIAGNOSIS — K219 Gastro-esophageal reflux disease without esophagitis: Secondary | ICD-10-CM | POA: Insufficient documentation

## 2024-07-16 DIAGNOSIS — K5900 Constipation, unspecified: Secondary | ICD-10-CM | POA: Diagnosis not present

## 2024-07-16 DIAGNOSIS — R2681 Unsteadiness on feet: Secondary | ICD-10-CM | POA: Diagnosis not present

## 2024-07-16 DIAGNOSIS — R7989 Other specified abnormal findings of blood chemistry: Secondary | ICD-10-CM | POA: Diagnosis not present

## 2024-07-16 DIAGNOSIS — R0689 Other abnormalities of breathing: Secondary | ICD-10-CM | POA: Diagnosis not present

## 2024-07-16 DIAGNOSIS — I10 Essential (primary) hypertension: Secondary | ICD-10-CM | POA: Diagnosis not present

## 2024-07-16 DIAGNOSIS — R059 Cough, unspecified: Secondary | ICD-10-CM | POA: Diagnosis not present

## 2024-07-16 DIAGNOSIS — R29890 Loss of height: Secondary | ICD-10-CM | POA: Diagnosis not present

## 2024-07-16 DIAGNOSIS — X58XXXA Exposure to other specified factors, initial encounter: Secondary | ICD-10-CM | POA: Insufficient documentation

## 2024-07-16 DIAGNOSIS — R231 Pallor: Secondary | ICD-10-CM | POA: Diagnosis not present

## 2024-07-16 DIAGNOSIS — N281 Cyst of kidney, acquired: Secondary | ICD-10-CM | POA: Diagnosis not present

## 2024-07-16 LAB — BASIC METABOLIC PANEL WITH GFR
Anion gap: 13 (ref 5–15)
BUN: 23 mg/dL (ref 8–23)
CO2: 21 mmol/L — ABNORMAL LOW (ref 22–32)
Calcium: 9.1 mg/dL (ref 8.9–10.3)
Chloride: 104 mmol/L (ref 98–111)
Creatinine, Ser: 1.52 mg/dL — ABNORMAL HIGH (ref 0.61–1.24)
GFR, Estimated: 46 mL/min — ABNORMAL LOW (ref >60)
Glucose, Bld: 165 mg/dL — ABNORMAL HIGH (ref 70–99)
Potassium: 3.7 mmol/L (ref 3.5–5.1)
Sodium: 138 mmol/L (ref 135–145)

## 2024-07-16 LAB — URINALYSIS, W/ REFLEX TO CULTURE (INFECTION SUSPECTED)
Bacteria, UA: NONE SEEN
Bilirubin Urine: NEGATIVE
Glucose, UA: NEGATIVE mg/dL
Hgb urine dipstick: NEGATIVE
Ketones, ur: NEGATIVE mg/dL
Leukocytes,Ua: NEGATIVE
Nitrite: NEGATIVE
Protein, ur: NEGATIVE mg/dL
Specific Gravity, Urine: 1.038 — ABNORMAL HIGH (ref 1.005–1.030)
pH: 5 (ref 5.0–8.0)

## 2024-07-16 LAB — AMMONIA: Ammonia: 20 umol/L (ref 9–35)

## 2024-07-16 LAB — HEPATIC FUNCTION PANEL
ALT: 8 U/L (ref 0–44)
AST: 16 U/L (ref 15–41)
Albumin: 3.4 g/dL — ABNORMAL LOW (ref 3.5–5.0)
Alkaline Phosphatase: 72 U/L (ref 38–126)
Bilirubin, Direct: 0.1 mg/dL (ref 0.0–0.2)
Indirect Bilirubin: 0.2 mg/dL — ABNORMAL LOW (ref 0.3–0.9)
Total Bilirubin: 0.3 mg/dL (ref 0.0–1.2)
Total Protein: 7.4 g/dL (ref 6.5–8.1)

## 2024-07-16 LAB — BLOOD GAS, VENOUS
Acid-base deficit: 3.4 mmol/L — ABNORMAL HIGH (ref 0.0–2.0)
Bicarbonate: 20.3 mmol/L (ref 20.0–28.0)
O2 Saturation: 65.5 %
Patient temperature: 37
pCO2, Ven: 32 mmHg — ABNORMAL LOW (ref 44–60)
pH, Ven: 7.41 (ref 7.25–7.43)
pO2, Ven: 40 mmHg (ref 32–45)

## 2024-07-16 LAB — GLUCOSE, CAPILLARY: Glucose-Capillary: 93 mg/dL (ref 70–99)

## 2024-07-16 LAB — CBC
HCT: 40.3 % (ref 39.0–52.0)
Hemoglobin: 13.3 g/dL (ref 13.0–17.0)
MCH: 29.6 pg (ref 26.0–34.0)
MCHC: 33 g/dL (ref 30.0–36.0)
MCV: 89.8 fL (ref 80.0–100.0)
Platelets: 181 K/uL (ref 150–400)
RBC: 4.49 MIL/uL (ref 4.22–5.81)
RDW: 15.6 % — ABNORMAL HIGH (ref 11.5–15.5)
WBC: 9 K/uL (ref 4.0–10.5)
nRBC: 0 % (ref 0.0–0.2)

## 2024-07-16 LAB — I-STAT CG4 LACTIC ACID, ED
Lactic Acid, Venous: 2.2 mmol/L (ref 0.5–1.9)
Lactic Acid, Venous: 2 mmol/L (ref 0.5–1.9)
Lactic Acid, Venous: 1.7 mmol/L (ref 0.5–1.9)

## 2024-07-16 LAB — MAGNESIUM: Magnesium: 2 mg/dL (ref 1.7–2.4)

## 2024-07-16 LAB — TROPONIN T, HIGH SENSITIVITY
Troponin T High Sensitivity: 29 ng/L — ABNORMAL HIGH (ref 0–19)
Troponin T High Sensitivity: 28 ng/L — ABNORMAL HIGH (ref 0–19)

## 2024-07-16 MED ORDER — APIXABAN 5 MG PO TABS
10.0000 mg | ORAL_TABLET | Freq: Two times a day (BID) | ORAL | Status: DC
  Administered 2024-07-16 – 2024-07-17 (×2): 10 mg via ORAL
  Filled 2024-07-16 (×2): qty 2

## 2024-07-16 MED ORDER — HYDROMORPHONE HCL 1 MG/ML IJ SOLN: 0.5000 mg | INTRAMUSCULAR | Status: DC | PRN

## 2024-07-16 MED ORDER — INSULIN ASPART 100 UNIT/ML IJ SOLN: 0.0000 [IU] | Freq: Every day | INTRAMUSCULAR | Status: DC

## 2024-07-16 MED ORDER — LACTATED RINGERS IV SOLN: INTRAVENOUS | Status: DC

## 2024-07-16 MED ORDER — ACETAMINOPHEN 500 MG PO TABS: 500.0000 mg | ORAL_TABLET | Freq: Four times a day (QID) | ORAL | Status: DC | PRN

## 2024-07-16 MED ORDER — OXYCODONE HCL 5 MG PO TABS: 5.0000 mg | ORAL_TABLET | ORAL | Status: DC | PRN

## 2024-07-16 MED ORDER — APIXABAN 5 MG PO TABS: 5.0000 mg | ORAL_TABLET | Freq: Two times a day (BID) | ORAL | Status: DC

## 2024-07-16 MED ORDER — POLYETHYLENE GLYCOL 3350 17 G PO PACK: 17.0000 g | PACK | Freq: Every day | ORAL | Status: DC | PRN

## 2024-07-16 MED ORDER — MELATONIN 5 MG PO TABS
5.0000 mg | ORAL_TABLET | Freq: Every evening | ORAL | Status: DC | PRN
Start: 2024-07-16 — End: 2024-07-17

## 2024-07-16 MED ORDER — SODIUM CHLORIDE 0.9 % IV SOLN
1.0000 g | Freq: Once | INTRAVENOUS | Status: AC
  Administered 2024-07-16: 1 g via INTRAVENOUS
  Filled 2024-07-16: qty 10

## 2024-07-16 MED ORDER — DOXYCYCLINE HYCLATE 100 MG PO TABS
100.0000 mg | ORAL_TABLET | Freq: Once | ORAL | Status: AC
  Administered 2024-07-16: 100 mg via ORAL
  Filled 2024-07-16: qty 1

## 2024-07-16 MED ORDER — PROCHLORPERAZINE EDISYLATE 10 MG/2ML IJ SOLN: 5.0000 mg | Freq: Four times a day (QID) | INTRAMUSCULAR | Status: DC | PRN

## 2024-07-16 MED ORDER — INSULIN ASPART 100 UNIT/ML IJ SOLN
0.0000 [IU] | Freq: Three times a day (TID) | INTRAMUSCULAR | Status: DC
  Administered 2024-07-17: 1 [IU] via SUBCUTANEOUS
  Filled 2024-07-16: qty 1

## 2024-07-16 MED ORDER — IOHEXOL 350 MG/ML SOLN
80.0000 mL | Freq: Once | INTRAVENOUS | Status: AC | PRN
  Administered 2024-07-16: 80 mL via INTRAVENOUS

## 2024-07-16 NOTE — ED Provider Notes (Signed)
 [place] [facility] AT [facility] Provider Note   CSN: [id number] Arrival date & time: 07/16/24  1425     Patient presents with: Shortness of Breath   Richard Navarro is a 78 y.o. male.   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  Patient's wife gives majority of the history but patient has had ongoing issues with imbalance for a while now but since Sunday things have gone downhill.  On Sunday he reports he was drinking his Glucerna and he does not know why but he fell over landing on his back.  Since that time he has had significant low back pain but he has not been able to get up or walk.  He reports he has not had a bowel movement in 1 week and he continues to have abdominal distention but denies any nausea or vomiting.  He has tried Colace and MiraLAX  without being able to have a bowel movement.  He is urinating but his wife is concerned that he is not urinating much.  He is also not eating or drinking much.  He says at this time he only eats breakfast because the rest of the meals just do not sound good.  He has not had fever cough and does not feel like his breathing is worse than baseline although his wife reports that he has had issues for a while and last night O2 sat was running low.  He denies any specific abdominal pain but does report some cramps on occasion when he moves.  He has not had any altered mental status but does report hitting his head last week and his wife reports that his balance issues have gotten a lot worse since then.  He does not take any anticoagulation and has no prior history of clot.  However his wife reports that since Sunday he has fallen 3 times but the other 2 times were when he was trying to get out of bed which he really has not  gotten up since the fall on Sunday.  The history is provided by the patient and the spouse.  Shortness of Breath      Prior to Admission medications   Medication Sig Start Date End Date Taking? Authorizing Provider  albuterol  (VENTOLIN  HFA) 108 (90 Base) MCG/ACT inhaler Inhale 2 puffs into the lungs every 4 (four) hours as needed for wheezing or shortness of breath. 12/07/20   Shelah Lamar RAMAN, MD  allopurinol  (ZYLOPRIM ) 100 MG tablet TAKE 1 TABLET DAILY 11/05/23   Katrinka Garnette KIDD, MD  amLODipine  (NORVASC ) 10 MG tablet TAKE 1 TABLET DAILY 08/24/23   Katrinka Garnette KIDD, MD  Cholecalciferol  (VITAMIN D3) 25 MCG (1000 UT) CAPS Take 1,000 Units by mouth daily.    [provider]  Continuous Blood Gluc Receiver (DEXCOM G7 RECEIVER) DEVI Please provide 1 receiver 05/12/22   Katrinka Garnette KIDD, MD  Continuous Blood Gluc Sensor (DEXCOM G7 SENSOR) MISC 1 Box by Does not apply route every 30 (thirty) days. 05/12/22   Katrinka Garnette KIDD, MD  docusate sodium  (COLACE) 100 MG capsule Take 300 mg by mouth daily.    [provider]  feeding supplement, GLUCERNA SHAKE, (GLUCERNA SHAKE) LIQD Take 237 mLs by mouth 2 (two) times daily as needed (when not eating).    [provider]  Ferrous Sulfate  (IRON) 325 (65 Fe) MG TABS Take 325 mg by mouth See admin instructions. Take 325 mg by mouth every other night    [provider]  GAVISCON EXTRA STRENGTH 160-105 MG CHEW Chew 1 tablet by mouth every 6 (six) hours as needed (for gas).    [provider]  glipiZIDE  (GLUCOTROL ) 5 MG tablet Take 1 tablet (5 mg total) by mouth daily before breakfast. 11/22/23   Katrinka Garnette KIDD, MD  glucose blood test strip Use to test blood sugar twice a day 05/12/22   Katrinka Garnette KIDD, MD  guaiFENesin  (MUCINEX ) 600 MG 12 hr tablet Take 600 mg by mouth 2 (two) times daily.    [provider]  ketotifen (ZADITOR) 0.025 % ophthalmic solution Place 1 drop into both eyes 2 (two) times daily as  needed (allergies).    [provider]  magnesium  hydroxide (MILK OF MAGNESIA) 400 MG/5ML suspension Take 30 mLs by mouth daily as needed (constipation).    [provider]  metFORMIN  (GLUCOPHAGE ) 500 MG tablet Take 1 tablet (500 mg total) by mouth daily with breakfast. 03/28/24   Katrinka Garnette KIDD, MD  metoprolol  tartrate (LOPRESSOR ) 25 MG tablet TAKE 1 TABLET TWICE A DAY 11/12/23   Katrinka Garnette KIDD, MD  OXYGEN  Inhale 2 L/min into the lungs as needed (for shortness of breath).    [provider]  pantoprazole  (PROTONIX ) 40 MG tablet TAKE 1 TABLET TWICE A DAY 11/21/23   Katrinka Garnette KIDD, MD  polyethylene glycol (MIRALAX  / GLYCOLAX ) 17 g packet Take 17 g by mouth See admin instructions. Mix 17 grams into the suggested amount of water  and drink once a day- HOLD FOR DIARRHEA    [provider]  rosuvastatin  (CRESTOR ) 5 MG tablet Take 1 tablet (5 mg total) by mouth daily. 03/28/24   Katrinka Garnette KIDD, MD  simethicone (MYLICON) 125 MG chewable tablet Chew 125 mg by mouth every 6 (six) hours as needed for flatulence.    [provider]  Tiotropium Bromide -Olodaterol (STIOLTO RESPIMAT ) 2.5-2.5 MCG/ACT AERS Inhale 2 puffs into the lungs daily. 11/01/23   Byrum, Robert S, MD  vitamin B-12 (CYANOCOBALAMIN ) 1000 MCG tablet Take 1,000 mcg by mouth daily.     [provider]  vitamin C  (ASCORBIC ACID ) 500 MG tablet Take 500 mg by mouth daily.    [provider]    Allergies: Afinitor  [everolimus ], Lisinopril , Other, Rosuvastatin , Statins, and Simvastatin    Review of Systems  Respiratory:  Positive for shortness of breath.     Updated Vital Signs BP (!) 173/97   Pulse 83   Temp 97.7 F (36.5 C) (Oral)   Resp 17   Ht 6' 3 (1.905 m)   Wt 72.6 kg   SpO2 94%   BMI 20.00 kg/m   Physical Exam Vitals and nursing note reviewed.  Constitutional:      General: He is not in acute distress.    Appearance: He is well-developed.  HENT:     Head:  Normocephalic and atraumatic.     Mouth/Throat:     Mouth: Mucous membranes are dry.  Eyes:     Conjunctiva/sclera: Conjunctivae normal.     Pupils: Pupils are equal, round, and reactive to light.  Cardiovascular:     Rate and Rhythm: Regular rhythm. Tachycardia present.     Pulses: Normal pulses.     Heart sounds: No murmur heard. Pulmonary:     Effort: Pulmonary effort is normal. Tachypnea  present. No respiratory distress.     Breath sounds: Examination of the right-lower field reveals decreased breath sounds. Examination of the left-lower field reveals decreased breath sounds. Decreased breath sounds present. No wheezing or rales.  Abdominal:     General: Bowel sounds are normal. There is distension.     Palpations: Abdomen is soft.     Tenderness: There is no abdominal tenderness. There is no guarding or rebound.  Musculoskeletal:        General: Tenderness present. Normal range of motion.     Cervical back: Normal range of motion and neck supple.     Lumbar back: Tenderness and bony tenderness present.       Back:     Right lower leg: No edema.     Left lower leg: No edema.  Skin:    General: Skin is warm and dry.     Findings: No erythema or rash.  Neurological:     Mental Status: He is alert and oriented to person, place, and time.     Comments: 4-5 strength in bilateral lower extremities, 5 out of 5 strength in bilateral upper extremities without pronator drift.  No visual field cuts and speech is normal.  Psychiatric:        Mood and Affect: Mood normal.        Behavior: Behavior normal.     (all labs ordered are listed, but only abnormal results are displayed) Labs Reviewed  BASIC METABOLIC PANEL WITH GFR - Abnormal; Notable for the following components:      Result Value   CO2 21 (*)    Glucose, Bld 165 (*)    Creatinine, Ser 1.52 (*)    GFR, Estimated 46 (*)    All other components within normal limits  CBC - Abnormal; Notable for the following components:    RDW 15.6 (*)    All other components within normal limits  HEPATIC FUNCTION PANEL - Abnormal; Notable for the following components:   Albumin 3.4 (*)    Indirect Bilirubin 0.2 (*)    All other components within normal limits  BLOOD GAS, VENOUS - Abnormal; Notable for the following components:   pCO2, Ven 32 (*)    Acid-base deficit 3.4 (*)    All other components within normal limits  I-STAT CG4 LACTIC ACID, ED - Abnormal; Notable for the following components:   Lactic Acid, Venous 2.0 (*)    All other components within normal limits  I-STAT CG4 LACTIC ACID, ED - Abnormal; Notable for the following components:   Lactic Acid, Venous 2.2 (*)    All other components within normal limits  TROPONIN T, HIGH SENSITIVITY - Abnormal; Notable for the following components:   Troponin T High Sensitivity 29 (*)    All other components within normal limits  MAGNESIUM   AMMONIA  URINALYSIS, W/ REFLEX TO CULTURE (INFECTION SUSPECTED)  I-STAT CG4 LACTIC ACID, ED  TROPONIN T, HIGH SENSITIVITY    EKG: EKG Interpretation Date/Time:  Wednesday July 16 2024 15:05:40 EST Ventricular Rate:  103 PR Interval:  184 QRS Duration:  75 QT Interval:  339 QTC Calculation: 444 R Axis:   65  Text Interpretation: Sinus tachycardia Abnormal R-wave progression, early transition No significant change since last tracing Confirmed by Doretha Folks ([id number]) on 07/16/2024 3:27:27 PM  Radiology: CT Head Wo Contrast Result Date: 07/16/2024 EXAM: CT HEAD WITHOUT 07/16/2024 06:21:00 PM TECHNIQUE: CT of the head was performed without the administration of intravenous contrast. Automated exposure control, iterative  reconstruction, and/or weight based adjustment of the mA/kV was utilized to reduce the radiation dose to as low as reasonably achievable. COMPARISON: None available. CLINICAL HISTORY: Head trauma, minor (Age >= 65y) FINDINGS: BRAIN AND VENTRICLES: No acute intracranial hemorrhage. No mass effect or midline  shift. No extra-axial fluid collection. No evidence of acute infarct. No hydrocephalus. Age-related cerebral volume loss. Mild periventricular and deep cerebral white matter disease. ORBITS: Bilateral lens replacement. SINUSES AND MASTOIDS: No acute abnormality. SOFT TISSUES AND SKULL: No acute skull fracture. No acute soft tissue abnormality. IMPRESSION: 1. No acute intracranial abnormality. 2. Age-related cerebral volume loss. Electronically signed by: Morene Hoard MD 07/16/2024 07:07 PM EST RP Workstation: [id number]   CT ABDOMEN PELVIS W CONTRAST Result Date: 07/16/2024 EXAM: CT ABDOMEN AND PELVIS WITH CONTRAST 07/16/2024 06:21:00 PM TECHNIQUE: CT of the abdomen and pelvis was performed with the administration of 80 mL of iohexol  (OMNIPAQUE ) 350 MG/ML injection. Multiplanar reformatted images are provided for review. Automated exposure control, iterative reconstruction, and/or weight-based adjustment of the mA/kV was utilized to reduce the radiation dose to as low as reasonably achievable. COMPARISON: CT chest abdomen and pelvis dated 04/07/2024. See additional report of CT chest and CT lumbar spine obtained contemporaneously. CLINICAL HISTORY: Abdominal pain, acute, nonlocalized; distention. FINDINGS: LOWER CHEST: Moderate-sized esophageal hiatal hernia. LIVER: Several focal liver lesions are demonstrated, with the most prominent being a heterogeneously hypoenhancing lesion in segment 7 measuring 2.3 cm diameter. This is likely metastatic. The lesion is better defined than on previous studies due to contrast material today. Size appears similar. GALLBLADDER AND BILE DUCTS: Gallbladder is unremarkable. No biliary ductal dilatation. SPLEEN: No acute abnormality. PANCREAS: No acute abnormality. ADRENAL GLANDS: No acute abnormality. KIDNEYS, URETERS AND BLADDER: Nephrograms are symmetrical. Right renal cyst measuring 4.1 cm in diameter, unchanged. This is likely benign and no imaging follow-up is  indicated. No stones in the kidneys or ureters. No hydronephrosis or hydroureter. No perinephric or periureteral stranding. The urinary bladder is normal. GI AND BOWEL: Gastric distention is normal. Suggestion of wall thickening in the pyloric region, which may represent gastritis or ulcer disease. The small bowel is decompressed. Stool-filled moderately distended colon likely representing constipation. No wall thickening or inflammatory stranding are appreciated. Appendix is normal. PERITONEUM AND RETROPERITONEUM: No ascites. No free air. No mesenteric mass. VASCULATURE: Aorta is normal in caliber. Calcification of the aorta. No aneurysm. LYMPH NODES: No lymphadenopathy. REPRODUCTIVE ORGANS: Prostate gland is not enlarged. BONES AND SOFT TISSUES: Focal sclerotic lesions are demonstrated in the left acetabulum and left hemipelvis possibly indicating bone metastasis. Consider bone scan and follow-up for further evaluation. Compression of the L3 vertebra, new since prior study. No focal soft tissue abnormality. IMPRESSION: 1. Heterogeneously hypoenhancing liver lesion in segment 7 measuring 2.3 cm, likely metastatic, similar in size to prior study. 2. Focal sclerotic lesions in the left acetabulum and left hemipelvis, possibly indicating bone metastasis; consider bone scan for further evaluation. 3. Suggestion of pyloric region wall thickening, possibly representing gastritis or peptic ulcer disease. 4. Stool-filled moderately distended colon, likely representing constipation, without wall thickening or inflammatory stranding. 5. Compression of the L3 vertebra, new since prior study. 6. Moderate-sized esophageal hiatal hernia. Electronically signed by: Elsie Gravely MD 07/16/2024 07:04 PM EST RP Workstation: [id number]   CT L-SPINE NO CHARGE Result Date: 07/16/2024 EXAM: CT OF THE LUMBAR SPINE WITHOUT CONTRAST 07/16/2024 06:21:00 PM TECHNIQUE: CT of the lumbar spine was performed without the administration of  intravenous contrast. Multiplanar reformatted images are provided for review. Automated  exposure control, iterative reconstruction, and/or weight based adjustment of the mA/kV was utilized to reduce the radiation dose to as low as reasonably achievable. COMPARISON: Previous CT chest abdomen and pelvis 04/07/2024. CLINICAL HISTORY: Treatment. FINDINGS: BONES AND ALIGNMENT: New L3 compression deformity with cortical irregularities at the superior and inferior endplates consistent with acute compression. There is about 20% loss of height. Other visualized vertebral body heights are maintained. No acute fracture or suspicious bone lesion, except for the L3 compression deformity. Normal alignment. No retrolisthesis or fracture fragments. Sacrum appears intact. DEGENERATIVE CHANGES: Degenerative changes in the spine with narrowed interspaces and endplate osteophyte formation most prominent at L3, L4, and L5. SOFT TISSUES: No acute abnormality. No abnormal paraspinal soft tissue mass or infiltration. Also see additional report of CT abdomen and pelvis obtained contemporaneously. IMPRESSION: 1. Acute L3 compression deformity with approximately 20% loss of height and cortical irregularities at the superior and inferior endplates, without retrolisthesis or fracture fragments. 2. Degenerative changes with interspace narrowing and endplate osteophyte formation most prominent at L3L5. Electronically signed by: Elsie Gravely MD 07/16/2024 06:58 PM EST RP Workstation: [id number]   CT Angio Chest PE W and/or Wo Contrast Result Date: 07/16/2024 EXAM: CTA CHEST 07/16/2024 06:21:00 PM TECHNIQUE: CTA of the chest was performed after the administration of intravenous contrast. Multiplanar reformatted images are provided for review. MIP images are provided for review. Automated exposure control, iterative reconstruction, and/or weight based adjustment of the mA/kV was utilized to reduce the radiation dose to as low as reasonably  achievable. COMPARISON: Chest radiograph 07/16/2024 and CT chest 04/07/2024. CLINICAL HISTORY: Pulmonary embolism (PE) suspected, high prob. Shortness of breath, lower worse over the last week. History of stage 4 lung cancer. Cough. Pneumococcal vaccine in use. FINDINGS: PULMONARY ARTERIES: Pulmonary arteries are adequately opacified for evaluation. Positive examination for pulmonary emboli with filling defect in the right lower lobe pulmonary artery, likely caused by extrinsic compression of the vessel due to adjacent mass and. lymphadenopathy. Pulmonary arteries are otherwise patent. Clot burden is low. Main pulmonary artery is normal in caliber. MEDIASTINUM: Cardiac enlargement with evidence of left ventricular hypertrophy. No pericardial effusion. No evidence of right heart strain. Calcification of the aorta and coronary arteries. No aortic aneurysm. LYMPH NODES: Mediastinal and right hilar lymphadenopathy. Largest aortopulmonic window lymph nodes measure up to 1.7 cm diameter, progressing since prior study. Right hilar lymph nodes measure up to 1.5 cm short axis dimension, likely progressing since the prior study, although comparison is difficult due to differences in contrast. No axillary lymphadenopathy. LUNGS AND PLEURA: Right posterior infrahilar mass and consolidation are again demonstrated, likely post-subtractive change. This measures about 3.5 x 3.9 cm diameter, similar to prior study. Diffuse emphysematous changes in the lungs. New since previous study, there is patchy airspace disease throughout both lungs with focal consolidation in the left base, this may represent developing pneumonia or edema. Aspiration could also be a possibility. Bronchial wall thickening centrally, more prominent on the left. Small right pleural effusion. No pneumothorax. UPPER ABDOMEN: For upper abdomen results, see additional report of CT abdomen and pelvis obtained contemporaneously. SOFT TISSUES AND BONES: No focal bone  lesions. No acute soft tissue abnormality. IMPRESSION: 1. Pulmonary embolism in the right lower lobe pulmonary artery likely due to extrinsic compression from mass and lymphadenopathy. Low clot burden and no CT evidence of right heart strain. 2. New patchy airspace disease in both lungs with focal consolidation in the left base, which may reflect developing pneumonia, edema, or aspiration. 3. Mediastinal and  right hilar lymphadenopathy, likely metastatic, increased from prior study. 4. Right posterior infrahilar mass and consolidation, similar to the prior study. Electronically signed by: Elsie Gravely MD 07/16/2024 06:55 PM EST RP Workstation: [id number]   DG Chest 2 View Result Date: 07/16/2024 CLINICAL DATA:  Shortness of breath. EXAM: CHEST - 2 VIEW COMPARISON:  Chest radiograph dated 03/07/2023. FINDINGS: Diffuse chronic intra coarsening and bibasilar atelectasis/scarring. No focal consolidation or pneumothorax. Trace right pleural effusion. Stable cardiac silhouette. No acute osseous pathology. IMPRESSION: 1. No acute cardiopulmonary process. 2. Trace right pleural effusion. Electronically Signed   By: Vanetta Chou M.D.   On: 07/16/2024 16:30     Procedures   Medications Ordered in the ED  lactated ringers  infusion (0 mLs Intravenous Paused 07/16/24 1958)  cefTRIAXone  (ROCEPHIN ) 1 g in sodium chloride  0.9 % 100 mL IVPB (1 g Intravenous New Bag/Given 07/16/24 1957)  doxycycline  (VIBRA -TABS) tablet 100 mg (has no administration in time range)  apixaban  (ELIQUIS ) tablet 10 mg (has no administration in time range)    Followed by  apixaban  (ELIQUIS ) tablet 5 mg (has no administration in time range)  iohexol  (OMNIPAQUE ) 350 MG/ML injection 80 mL (80 mLs Intravenous Contrast Given 07/16/24 1802)                                    Medical Decision Making Amount and/or Complexity of Data Reviewed Independent Historian: EMS External Data Reviewed: notes. Labs: ordered. Decision-making  details documented in ED Course. Radiology: ordered and independent interpretation performed. Decision-making details documented in ED Course. ECG/medicine tests: ordered and independent interpretation performed. Decision-making details documented in ED Course.  Risk Prescription drug management. Decision regarding hospitalization.   Pt with multiple medical problems and comorbidities and presenting today with a complaint that caries a high risk for morbidity and mortality.  Here today with the above complaints.  Concern for possible lumbar fracture, ileus, bowel obstruction, anemia, dehydration and electrolyte abnormalities, PE or pleural effusions.  Also concern for dehydration.  Patient does not have evidence of fluid overload suggestive of CHF and lower suspicion for ACS.  Also concern for possible infectious etiology, sepsis versus intracranial hemorrhage from recurrent falls.  Patient is consistently mildly tachycardic on exam and mildly tachypneic with sats between 90 to to 94% on his home 2 L. Patient received IV fluids from EMS.  Labs are pending.  Imaging is pending. I dependently interpreted patient's EKG and labs.  EKG was sinus tachycardia but no other acute changes, CBC with normal white count and hemoglobin, BMP with stable creatinine of 1.52 and normal electrolytes.  Anion gap is normal.  Lactate is elevated at 2.2 and then second 1 is at 2.0. I have independently visualized and interpreted pt's images today.  Chest x-ray with bilateral small effusion but no evidence of pneumonia. 8:21 PM CT of the head was negative for acute pathology, CT of the chest per radiology reports pulmonary embolism in the right lower lobe of the pulmonary artery likely due to extrinsic compression from mass and lymphadenopathy low clot burden and no evidence of right heart strain however patient's troponin is 35 today without old to compare.  Last echo was in 2022 and at that time was normal.  Also patient  has lymphadenopathy which does not appear changed but does have new patchy airspace disease in both lungs with focal consolidation in the left base which may be developing pneumonia edema or aspiration.  Given patient's elevated lactate, new weakness and lower O2 sats will cover with Rocephin  and doxycycline .  He was also given oral Eliquis  and denies any prior history of GI bleeding or other forms of bleeding.  CT abdomen pelvis showed known liver lesion which is similar to prior and sclerotic lesions of the bone as well as a new L3 compression fracture with 20% height loss.  Spoke with neurosurgery and patient can wear a lumbar corset but can participate with physical therapy and this is for comfort.  He can follow-up with neurosurgery in 2 weeks.  Discussed all this with the patient and his wife.  Will admit for further care.  CRITICAL CARE Performed by: Hydeia Mcatee Total critical care time: 30 minutes Critical care time was exclusive of separately billable procedures and treating other patients. Critical care was necessary to treat or prevent imminent or life-threatening deterioration. Critical care was time spent personally by me on the following activities: development of treatment plan with patient and/or surrogate as well as nursing, discussions with consultants, evaluation of patient's response to treatment, examination of patient, obtaining history from patient or surrogate, ordering and performing treatments and interventions, ordering and review of laboratory studies, ordering and review of radiographic studies, pulse oximetry and re-evaluation of patient's condition.        Final diagnoses:  None    ED Discharge Orders     None          Doretha Folks, MD 07/16/24 2024

## 2024-07-16 NOTE — Discharge Instructions (Addendum)
 Information on my medicine - ELIQUIS  (apixaban )  Why was Eliquis  prescribed for you? Eliquis  was prescribed to treat blood clots that may have been found in the veins of your legs (deep vein thrombosis) or in your lungs (pulmonary embolism) and to reduce the risk of them occurring again.  What do You need to know about Eliquis  ? The starting dose is 10 mg (two 5 mg tablets) taken TWICE daily for the FIRST SEVEN (7) DAYS, then on 07/23/24  the dose is reduced to ONE 5 mg tablet taken TWICE daily.  Eliquis  may be taken with or without food.   Try to take the dose about the same time in the morning and in the evening. If you have difficulty swallowing the tablet whole please discuss with your pharmacist how to take the medication safely.  Take Eliquis  exactly as prescribed and DO NOT stop taking Eliquis  without talking to the doctor who prescribed the medication.  Stopping may increase your risk of developing a new blood clot.  Refill your prescription before you run out.  After discharge, you should have regular check-up appointments with your healthcare provider that is prescribing your Eliquis .    What do you do if you miss a dose? If a dose of ELIQUIS  is not taken at the scheduled time, take it as soon as possible on the same day and twice-daily administration should be resumed. The dose should not be doubled to make up for a missed dose.  Important Safety Information A possible side effect of Eliquis  is bleeding. You should call your healthcare provider right away if you experience any of the following: Bleeding from an injury or your nose that does not stop. Unusual colored urine (red or dark brown) or unusual colored stools (red or black). Unusual bruising for unknown reasons. A serious fall or if you hit your head (even if there is no bleeding).  Some medicines may interact with Eliquis  and might increase your risk of bleeding or clotting while on Eliquis . To help avoid this,  consult your healthcare provider or pharmacist prior to using any new prescription or non-prescription medications, including herbals, vitamins, non-steroidal anti-inflammatory drugs (NSAIDs) and supplements.  This website has more information on Eliquis  (apixaban ): [email or web address] .[email or web address] dena

## 2024-07-16 NOTE — ED Triage Notes (Addendum)
 Patient BIB [facility] from home. Shortness of breath that has worsened over the last week. Has stage 4 lung cancer. Has a cough. [place] he has back pain due to increased falls. Wears 2L [place] baseline. Family said oxygen  meter at home said 81%.   EMS fluid
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
## 2024-07-17 DIAGNOSIS — N189 Chronic kidney disease, unspecified: Secondary | ICD-10-CM

## 2024-07-17 DIAGNOSIS — N179 Acute kidney failure, unspecified: Secondary | ICD-10-CM | POA: Diagnosis not present

## 2024-07-17 DIAGNOSIS — I2693 Single subsegmental pulmonary embolism without acute cor pulmonale: Secondary | ICD-10-CM | POA: Diagnosis not present

## 2024-07-17 DIAGNOSIS — J449 Chronic obstructive pulmonary disease, unspecified: Secondary | ICD-10-CM | POA: Diagnosis not present

## 2024-07-17 DIAGNOSIS — I2699 Other pulmonary embolism without acute cor pulmonale: Secondary | ICD-10-CM | POA: Diagnosis not present

## 2024-07-17 DIAGNOSIS — I10 Essential (primary) hypertension: Secondary | ICD-10-CM | POA: Diagnosis not present

## 2024-07-17 DIAGNOSIS — Z7401 Bed confinement status: Secondary | ICD-10-CM | POA: Diagnosis not present

## 2024-07-17 LAB — CBC
HCT: 38.1 % — ABNORMAL LOW (ref 39.0–52.0)
Hemoglobin: 12.3 g/dL — ABNORMAL LOW (ref 13.0–17.0)
MCH: 29.6 pg (ref 26.0–34.0)
MCHC: 32.3 g/dL (ref 30.0–36.0)
MCV: 91.6 fL (ref 80.0–100.0)
Platelets: 165 K/uL (ref 150–400)
RBC: 4.16 MIL/uL — ABNORMAL LOW (ref 4.22–5.81)
RDW: 15.6 % — ABNORMAL HIGH (ref 11.5–15.5)
WBC: 6.7 K/uL (ref 4.0–10.5)
nRBC: 0 % (ref 0.0–0.2)

## 2024-07-17 LAB — GLUCOSE, CAPILLARY
Glucose-Capillary: 126 mg/dL — ABNORMAL HIGH (ref 70–99)
Glucose-Capillary: 107 mg/dL — ABNORMAL HIGH (ref 70–99)

## 2024-07-17 LAB — BASIC METABOLIC PANEL WITH GFR
Anion gap: 9 (ref 5–15)
BUN: 16 mg/dL (ref 8–23)
CO2: 26 mmol/L (ref 22–32)
Calcium: 9.3 mg/dL (ref 8.9–10.3)
Chloride: 104 mmol/L (ref 98–111)
Creatinine, Ser: 1.31 mg/dL — ABNORMAL HIGH (ref 0.61–1.24)
GFR, Estimated: 55 mL/min — ABNORMAL LOW (ref >60)
Glucose, Bld: 112 mg/dL — ABNORMAL HIGH (ref 70–99)
Potassium: 3.7 mmol/L (ref 3.5–5.1)
Sodium: 138 mmol/L (ref 135–145)

## 2024-07-17 LAB — MAGNESIUM: Magnesium: 2 mg/dL (ref 1.7–2.4)

## 2024-07-17 LAB — PHOSPHORUS: Phosphorus: 2.5 mg/dL (ref 2.5–4.6)

## 2024-07-17 MED ORDER — APIXABAN (ELIQUIS) VTE STARTER PACK (10MG AND 5MG)
ORAL_TABLET | ORAL | 0 refills | Status: DC
  Filled 2024-07-18: qty 74, 30d supply, fill #0

## 2024-07-17 MED ORDER — FERROUS SULFATE 325 (65 FE) MG PO TABS: 325.0000 mg | ORAL_TABLET | ORAL | Status: DC

## 2024-07-17 MED ORDER — AMLODIPINE BESYLATE 10 MG PO TABS: 5.0000 mg | ORAL_TABLET | Freq: Every day | ORAL | Status: DC

## 2024-07-17 MED ORDER — ARFORMOTEROL TARTRATE 15 MCG/2ML IN NEBU
15.0000 ug | INHALATION_SOLUTION | Freq: Two times a day (BID) | RESPIRATORY_TRACT | Status: DC
  Administered 2024-07-17: 15 ug via RESPIRATORY_TRACT
  Filled 2024-07-17 (×2): qty 2

## 2024-07-17 MED ORDER — VITAMIN B-12 1000 MCG PO TABS
1000.0000 ug | ORAL_TABLET | Freq: Every day | ORAL | Status: DC
  Administered 2024-07-17: 1000 ug via ORAL
  Filled 2024-07-17: qty 1

## 2024-07-17 MED ORDER — PANTOPRAZOLE SODIUM 40 MG PO TBEC
40.0000 mg | DELAYED_RELEASE_TABLET | Freq: Two times a day (BID) | ORAL | Status: DC
  Administered 2024-07-17 (×2): 40 mg via ORAL
  Filled 2024-07-17 (×2): qty 1

## 2024-07-17 MED ORDER — UMECLIDINIUM BROMIDE 62.5 MCG/ACT IN AEPB
1.0000 | INHALATION_SPRAY | Freq: Every day | RESPIRATORY_TRACT | Status: DC
  Administered 2024-07-17: 1 via RESPIRATORY_TRACT
  Filled 2024-07-17: qty 7

## 2024-07-17 MED ORDER — VITAMIN C 500 MG PO TABS
500.0000 mg | ORAL_TABLET | Freq: Every day | ORAL | Status: DC
  Administered 2024-07-17: 500 mg via ORAL
  Filled 2024-07-17: qty 1

## 2024-07-17 MED ORDER — METOPROLOL TARTRATE 25 MG PO TABS
12.5000 mg | ORAL_TABLET | Freq: Two times a day (BID) | ORAL | Status: DC
  Administered 2024-07-17 (×2): 12.5 mg via ORAL
  Filled 2024-07-17 (×2): qty 1

## 2024-07-17 MED ORDER — ALBUTEROL SULFATE (2.5 MG/3ML) 0.083% IN NEBU: 3.0000 mL | INHALATION_SOLUTION | RESPIRATORY_TRACT | Status: DC | PRN

## 2024-07-17 NOTE — TOC Transition Note (Signed)
 Transition of Care [facility]) - Discharge Note   Patient Details  Name: Richard Navarro MRN: [id number] Date of Birth: Nov 06, 1945  Transition of Care [facility]) CM/SW Contact:  [name] Service, RN Phone Number: 07/17/2024, 11:14 AM   Clinical Narrative: [place] to [facility] about d/c plans-d/c home w/HHC-HHPT [facility] rep [facility] accepted. Has home 02-[employer] has travel tank. Has own transport home.      Final next level of care: Home w Home Health Services Barriers to Discharge: No Barriers Identified   Patient Goals and CMS Choice Patient states their goals for this hospitalization and ongoing recovery are:: Home CMS Medicare.gov Compare Post Acute Care list provided to:: Patient Represenative (must comment) Nurse, Learning Disability))        Discharge Placement                       Discharge Plan and Services Additional resources added to the After Visit Summary for     Discharge Planning Services: CM Consult Post Acute Care Choice: Home Health                    HH Arranged: PT [facility] Agency: [facility] Date [facility] Agency Contacted: 07/17/24 Time HH Agency Contacted: 1114 Representative spoke with at [facility] Agency: [facility]  Social Drivers of Health (SDOH) Interventions SDOH Screenings   Food Insecurity: No Food Insecurity (07/16/2024)  Housing: Low Risk  (07/16/2024)  Transportation Needs: No Transportation Needs (07/16/2024)  Utilities: Not At Risk (07/16/2024)  Alcohol Screen: Low Risk  (01/03/2024)  Depression (PHQ2-9): Low Risk  (03/27/2024)  Financial Resource Strain: Low Risk  (01/03/2024)  Physical Activity: Insufficiently Active (01/03/2024)  Social Connections: Socially Isolated (07/16/2024)  Stress: Stress Concern Present (01/03/2024)  Tobacco Use: Medium Risk (07/16/2024)  Health Literacy: Adequate Health Literacy (01/03/2024)     Readmission Risk Interventions    01/22/2023   11:45 AM  Readmission Risk Prevention Plan  Transportation Screening  Complete  PCP or Specialist Appt within 5-7 Days Complete  Home Care Screening Complete  Medication Review (RN CM) Complete

## 2024-07-17 NOTE — Evaluation (Signed)
 Occupational Therapy Evaluation Patient Details Name: Richard Navarro MRN: [id number] DOB: 07/09/1946 Today's Date: 07/17/2024   History of Present Illness   Pt is a 78y.o. male admitted on 11/26 due to worsening SOB over the last week as well as x3 recent falls. CT revealed pulmonary embolism, L3 compression fracture, possible bone mets, and increased stool burden. PMH includes: hypertension, hyperlipidemia, type 2 diabetes, chronic anxiety/depression, PTSD ([place] veteran), chronic hypoxia on 2 L nasal cannula at baseline, CKD 3B, anemia of chronic disease, stage IV low-grade neuroendocrine carcinoma, right lower lobe lung mass, right hilar lymphadenopathy and liver metastasis.     Clinical Impressions PTA, pt lives with significant other, reports independence with ADLs, IADLs and mobility without AD though has had recent falls. Pt describes himself as stubborn and does not like to use DME, home O2 consistently. Pt presents now with primary limitation being back pain from newly found L3 compression fx. Educated re: back precautions and provided energy conservation handout as well. Pt requiring assist for bed mobility but able to transfer OOB and manage ADLs w/ no more than CGA. Back brace not present yet though noted orders placed 11/26. Called ortho tech who plans to deliver to pt room prior to DC today. Will continue to follow if pt remains admitted though no follow up OT needed at this time.     If plan is discharge home, recommend the following:   A little help with bathing/dressing/bathroom;Assistance with cooking/housework;Help with stairs or ramp for entrance     Functional Status Assessment   Patient has had a recent decline in their functional status and demonstrates the ability to make significant improvements in function in a reasonable and predictable amount of time.     Equipment Recommendations   None recommended by OT     Recommendations for Other Services          Precautions/Restrictions   Precautions Precautions: Back;Other (comment) Precaution Booklet Issued: Yes (comment) Precaution/Restrictions Comments: back precautions taught, no back brace noted in room at time of eval but noted orders for one when ambulating Required Braces or Orthoses: Spinal Brace Spinal Brace: Lumbar corset Restrictions Weight Bearing Restrictions Per Provider Order: No     Mobility Bed Mobility Overal bed mobility: Needs Assistance Bed Mobility: Supine to Sit, Sit to Sidelying, Rolling Rolling: Supervision   Supine to sit: Mod assist, HOB elevated, Used rails   Sit to sidelying: Supervision General bed mobility comments: Educat on log rolling with pt initially attempting but then opted for supine > sit using bed rails- ended up horizontal in bed, declining assist initially with extended time spent cueing on techniques to try to sit up, use of gait belt as loop on bedrail, etc. Able to return to bed w/ cues for sidelying with no assist needed.    Transfers Overall transfer level: Needs assistance Equipment used: None Transfers: Sit to/from Stand Sit to Stand: Supervision, From elevated surface           General transfer comment: increased time/effort      Balance Overall balance assessment: Needs assistance Sitting-balance support: Feet supported Sitting balance-Leahy Scale: Fair     Standing balance support: Bilateral upper extremity supported, No upper extremity supported, During functional activity Standing balance-Leahy Scale: Fair                             ADL either performed or assessed with clinical judgement   ADL Overall ADL's :  Needs assistance/impaired Eating/Feeding: Independent   Grooming: Supervision/safety;Standing   Upper Body Bathing: Set up;Sitting   Lower Body Bathing: Contact guard assist;Sitting/lateral leans;Sit to/from stand   Upper Body Dressing : Set up;Sitting   Lower Body Dressing:  Contact guard assist   Toilet Transfer: Contact guard assist;Stand-pivot;BSC/3in1   Toileting- Clothing Manipulation and Hygiene: Contact guard assist;Sitting/lateral lean;Sit to/from stand               Vision Baseline Vision/History: 1 Wears glasses Ability to See in Adequate Light: 0 Adequate Patient Visual Report: No change from baseline Vision Assessment?: No apparent visual deficits     Perception         Praxis         Pertinent Vitals/Pain Pain Assessment Pain Assessment: Faces Faces Pain Scale: Hurts even more Pain Location: back with movement Pain Descriptors / Indicators: Grimacing, Guarding Pain Intervention(s): Monitored during session, Limited activity within patient's tolerance     Extremity/Trunk Assessment Upper Extremity Assessment Upper Extremity Assessment: Overall WFL for tasks assessed;Right hand dominant   Lower Extremity Assessment Lower Extremity Assessment: Defer to PT evaluation RLE Deficits / Details: grossly 4-/5 strength with functional mobility tasks; increased time and effort required secondary to pain RLE: Unable to fully assess due to pain RLE Sensation: WNL LLE Deficits / Details: grossly 4-/5 strength with functional mobility tasks; increased time and effort required secondart to pain LLE: Unable to fully assess due to pain LLE Sensation: WNL   Cervical / Trunk Assessment Cervical / Trunk Assessment: Kyphotic   Communication Communication Communication: No apparent difficulties Factors Affecting Communication: Hearing impaired   Cognition Arousal: Alert Behavior During Therapy: WFL for tasks assessed/performed Cognition: No apparent impairments                               Following commands: Intact       Cueing  General Comments   Cueing Techniques: Verbal cues;Tactile cues      Exercises     Shoulder Instructions      Home Living Family/patient expects to be discharged to:: Private  residence Living Arrangements: Spouse/significant other Available Help at Discharge: Family Type of Home: House Home Access: Stairs to enter Secretary/administrator of Steps: 1 Entrance Stairs-Rails: None Home Layout: Two level Alternate Level Stairs-Number of Steps: x1 flight Alternate Level Stairs-Rails: Right Bathroom Shower/Tub: Producer, Television/film/video: Standard     Home Equipment: Agricultural Consultant (2 wheels);Shower seat;Grab bars - tub/shower   Additional Comments: Has home O2, does not wear all the time      Prior Functioning/Environment Prior Level of Function : Independent/Modified Independent;Driving;History of Falls (last six months)             Mobility Comments: Indep with mobility, does not like to use DME for mobility ADLs Comments: Reports able to manage ADLs, stands for showers and removes O2 for showers, shares IADLs with spouse    OT Problem List: Decreased activity tolerance;Impaired balance (sitting and/or standing);Pain;Decreased knowledge of use of DME or AE;Decreased knowledge of precautions   OT Treatment/Interventions: Self-care/ADL training;Therapeutic exercise;Energy conservation;DME and/or AE instruction;Therapeutic activities;Patient/family education;Balance training      OT Goals(Current goals can be found in the care plan section)   Acute Rehab OT Goals Patient Stated Goal: home today, back pain control OT Goal Formulation: With patient Time For Goal Achievement: 07/31/24 Potential to Achieve Goals: Good   OT Frequency:  Min 2X/week  Co-evaluation              AM-PAC OT 6 Clicks Daily Activity     Outcome Measure Help from another person eating meals?: None Help from another person taking care of personal grooming?: A Little Help from another person toileting, which includes using toliet, bedpan, or urinal?: A Little Help from another person bathing (including washing, rinsing, drying)?: A Little Help from another  person to put on and taking off regular upper body clothing?: A Little Help from another person to put on and taking off regular lower body clothing?: A Little 6 Click Score: 19   End of Session Nurse Communication: Mobility status;Other (comment) (inquired about back brace)  Activity Tolerance: Patient tolerated treatment well;Patient limited by pain Patient left: in bed;with call bell/phone within reach  OT Visit Diagnosis: Unsteadiness on feet (R26.81);Other abnormalities of gait and mobility (R26.89)                Time: 8787-8698 OT Time Calculation (min): 49 min Charges:  OT General Charges $OT Visit: 1 Visit OT Evaluation $OT Eval Low Complexity: 1 Low OT Treatments $Self Care/Home Management : 8-22 mins $Therapeutic Activity: 8-22 mins  Mliss NOVAK, OTR/L Acute Rehab Services Office: [phone]   Mliss Fish 07/17/2024, 1:18 PM

## 2024-07-17 NOTE — Progress Notes (Signed)
 Orthopedic Tech Progress Note Patient Details:  Richard Navarro September 17, 1945 [id number]  Ortho Devices Type of Ortho Device: Lumbar corsett Ortho Device/Splint Location: lso sized and instructed on use Ortho Device/Splint Interventions: Ordered, Application, Adjustment   Post Interventions Patient Tolerated: Well Instructions Provided: Adjustment of device, Care of device  [name] 07/17/2024, 1:30 PM

## 2024-07-17 NOTE — Evaluation (Signed)
 Physical Therapy Evaluation Patient Details Name: Richard Navarro MRN: [id number] DOB: 05/28/1946 Today's Date: 07/17/2024  History of Present Illness  Pt is a 78y.o. male admitted on 11/26 due to worsening SOB over the last week as well as x3 recent falls. CT revealed pulmonary embolism, L3 compression fracture, possible bone mets, and increased stool burden. PMH includes: hypertension, hyperlipidemia, type 2 diabetes, chronic anxiety/depression, PTSD ([place] veteran), chronic hypoxia on 2 L nasal cannula at baseline, CKD 3B, anemia of chronic disease, stage IV low-grade neuroendocrine carcinoma, right lower lobe lung mass, right hilar lymphadenopathy and liver metastasis.  Clinical Impression  Pt presented for physical therapy evaluation following prolonged SOB for a week and recent falls at home resulting in L3 compression fracture. See above for additional information. Prior to admission, pt reports being IND with functional mobility, driving and assisting spouse with IADLs. He has had a decline in his medical status over the last several months requiring increased time and effort to complete all tasks. Upon PT arrival, he is resting in bed, reports no pain at rest, and requiring 4L supplemental O2. Vitals monitored during session with mobility; HR increases to 114, O2 sats dropped to 88% and recover >92% with rest, and RR increased to 22-25. Educated patient on breathing techniques to assist with SOB as well as help with pain management. He is currently functioning below his baseline with pain being the largest limiting factor; see problem list below for additional details. He will benefit from skilled physical therapy intervention following discharge at the HHPT level to address these deficits and improve his functional independence and mobility. He will continue to benefit from skilled intervention while admitted to acute rehab.       If plan is discharge home, recommend the following: A  little help with walking and/or transfers;A little help with bathing/dressing/bathroom;Assist for transportation;Assistance with cooking/housework   Can travel by private vehicle        Equipment Recommendations None recommended by PT  Recommendations for Other Services       Functional Status Assessment Patient has had a recent decline in their functional status and demonstrates the ability to make significant improvements in function in a reasonable and predictable amount of time.     Precautions / Restrictions Precautions Precautions: Other (comment) (lumbar corsett recommended for ambulation) Recall of Precautions/Restrictions: Intact Required Braces or Orthoses: Spinal Brace Spinal Brace: Lumbar corset Restrictions Weight Bearing Restrictions Per Provider Order: No      Mobility  Bed Mobility Overal bed mobility: Needs Assistance Bed Mobility: Supine to Sit, Sit to Supine     Supine to sit: Min assist, HOB elevated, Used rails Sit to supine: Min assist, Used rails, HOB elevated   General bed mobility comments: limited by pain, required assist to elevate trunk and move BLE in/out of bed    Transfers Overall transfer level: Needs assistance Equipment used: Rolling walker (2 wheels) Transfers: Sit to/from Stand Sit to Stand: Min assist           General transfer comment: min A sit<>stand from elevated bed, limited by pain    Ambulation/Gait Ambulation/Gait assistance: Min assist Gait Distance (Feet): 5 Feet Assistive device: Rolling walker (2 wheels)   Gait velocity: decreased     General Gait Details: lateral steps towards head of bed, increased RR to 22-25 with sitting and standing activity; additionally limited by pain; pt declined additional standing activity and requested to return to bed  Stairs  Wheelchair Mobility     Tilt Bed    Modified Rankin (Stroke Patients Only)       Balance Overall balance assessment: Needs  assistance Sitting-balance support: Bilateral upper extremity supported, Feet supported Sitting balance-Leahy Scale: Fair     Standing balance support: Bilateral upper extremity supported Standing balance-Leahy Scale: Fair Standing balance comment: BUE support on RW and physical assist for balance secondary to pain                             Pertinent Vitals/Pain Pain Assessment Pain Assessment: 0-10 Pain Score: 6  Pain Location: low back, no pain at rest however increases with sit<>supine and sit<>stand to 6/10; once standing pt reports pain starts to settle down Pain Descriptors / Indicators: Sharp, Shooting, Stabbing, Aching, Grimacing, Guarding Pain Intervention(s): Monitored during session, Limited activity within patient's tolerance, Repositioned, Other (comment) (RN notified regarding pain during session, however pt declined pain meds)    Home Living Family/patient expects to be discharged to:: Private residence (townhouse) Living Arrangements: Spouse/significant other Available Help at Discharge: Family Type of Home: House (townhome) Home Access: Stairs to enter Entrance Stairs-Rails: None Entrance Stairs-Number of Steps: 1 Alternate Level Stairs-Number of Steps: x1 flight Home Layout: Two level Home Equipment: Other (comment);Shower seat;Rolling Walker (2 wheels) (4 grab bars in shower) Additional Comments: reports not using any equipment for mobility because he doesn't want to look handicap. He also is set up with home oxygen  and portable unit.    Prior Function Prior Level of Function : Independent/Modified Independent;Driving;History of Falls (last six months)             Mobility Comments: Pt reports he is IND with mobility, however things now take more time and effort than they used to due to worsening medical status over the last several months. he reports having additional falls other than recent fall with back strike, however no other serious  injuries. He does not like to use his SPC or RW. ADLs Comments: He requires intermittent assistance for ADLs, spouse and him share responsibility for IADLs; he enjoys cooking     Extremity/Trunk Assessment   Upper Extremity Assessment Upper Extremity Assessment: Defer to OT evaluation    Lower Extremity Assessment Lower Extremity Assessment: Generalized weakness;RLE deficits/detail;LLE deficits/detail RLE Deficits / Details: grossly 4-/5 strength with functional mobility tasks; increased time and effort required secondary to pain RLE: Unable to fully assess due to pain RLE Sensation: WNL LLE Deficits / Details: grossly 4-/5 strength with functional mobility tasks; increased time and effort required secondart to pain LLE: Unable to fully assess due to pain LLE Sensation: WNL    Cervical / Trunk Assessment Cervical / Trunk Assessment: Kyphotic  Communication   Communication Communication: No apparent difficulties Factors Affecting Communication: Hearing impaired    Cognition Arousal: Alert Behavior During Therapy: WFL for tasks assessed/performed   PT - Cognitive impairments: Orientation   Orientation impairments: Person, Time, Situation                   PT - Cognition Comments: pt able to state he is at [employer] when asked what hospital we are in, however later states It's not supposed to be cold like this [place] that's why I came here Following commands: Intact       Cueing Cueing Techniques: Verbal cues, Tactile cues     General Comments  Exercises     Assessment/Plan    PT Assessment Patient needs continued PT services  PT Problem List Decreased strength;Decreased activity tolerance;Decreased mobility;Pain       PT Treatment Interventions Gait training;Stair training;Functional mobility training;Therapeutic exercise    PT Goals (Current goals can be found in the Care Plan section)  Acute Rehab PT  Goals Patient Stated Goal: get back home and be able to move without so much pain PT Goal Formulation: With patient Time For Goal Achievement: 07/31/24 Potential to Achieve Goals: Good    Frequency Min 2X/week     Co-evaluation               AM-PAC PT 6 Clicks Mobility  Outcome Measure Help needed turning from your back to your side while in a flat bed without using bedrails?: A Little Help needed moving from lying on your back to sitting on the side of a flat bed without using bedrails?: A Little Help needed moving to and from a bed to a chair (including a wheelchair)?: A Little Help needed standing up from a chair using your arms (e.g., wheelchair or bedside chair)?: A Little Help needed to walk in hospital room?: A Little Help needed climbing 3-5 steps with a railing? : A Lot 6 Click Score: 17    End of Session Equipment Utilized During Treatment: Gait belt Activity Tolerance: Patient limited by pain Patient left: in bed;with call bell/phone within reach;with bed alarm set Nurse Communication: Mobility status;Other (comment) (pain with mobility) PT Visit Diagnosis: Pain;Difficulty in walking, not elsewhere classified (R26.2);Repeated falls (R29.6) Pain - part of body:  (low back)    Time: 9050-8969 PT Time Calculation (min) (ACUTE ONLY): 41 min   Charges:   PT Evaluation $PT Eval Moderate Complexity: 1 Mod PT Treatments $Therapeutic Activity: 8-22 mins           Isaiah DEL. Mable Dara, PT, DPT   [employer] 07/17/2024, 11:05 AM

## 2024-07-17 NOTE — Care Management CC44 (Signed)
 Condition Code 44 Documentation Completed  Patient Details  Name: Richard Navarro MRN: [id number] Date of Birth: 05/06/1946   Condition Code 44 given:  Yes Patient signature on Condition Code 44 notice:  Yes Documentation of 2 MD's agreement:  Yes Code 44 added to claim:  Yes    [name] Service, RN 07/17/2024, 12:13 PM

## 2024-07-17 NOTE — Care Management Obs Status (Signed)
 MEDICARE OBSERVATION STATUS NOTIFICATION   Patient Details  Name: Richard Navarro MRN: [id number] Date of Birth: 1945/12/31   Medicare Observation Status Notification Given:  Yes    [name] Service, RN 07/17/2024, 12:13 PM

## 2024-07-17 NOTE — Discharge Summary (Signed)
 Physician Discharge Summary   Patient: Richard Navarro MRN: [id number] DOB: 1946-07-05  Admit date:     07/16/2024  Discharge date: 07/17/24  Discharge Physician: Carliss LELON Canales   PCP: Katrinka Garnette KIDD, MD   Recommendations at discharge:    Pt to be discharged home.   If you experience worsening fever, chills, chest pain, shortness of breath, or other concerning symptoms, please call your PCP or go to the emergency department immediately.  Discharge Diagnoses: Principal Problem:   Acute pulmonary embolism (HCC)  Resolved Problems:   * No resolved hospital problems. *   Hospital Course:  78 y.o. male with medical history significant for hypertension, hyperlipidemia, type 2 diabetes, chronic anxiety/depression, PTSD ([place] veteran), chronic hypoxia on 2 L nasal cannula at baseline, CKD 3B, anemia of chronic disease, stage IV low-grade neuroendocrine carcinoma, right lower lobe lung mass, right hilar lymphadenopathy and liver metastasis, diagnosed in April 2018, who presents to the ER due to progressively worsening shortness of breath for the past 1 week.  The patient fell on Sunday, 4 days ago, around 4:30 PM since that time he has been unable to walk due to significant back pain.  His significant other brought him to the ER for further evaluation.   In the ER, tachypneic, with elevated BPs.  O2 saturation 97% on 2 L.  Chest x-ray revealed no acute cardiopulmonary process.  Trace right pleural effusion.     Noncontrast head CT showed no acute intracranial normality.  Age-related cerebral volume loss.     CT angio chest was positive for pulmonary embolism in the right lower lobe pulmonary artery likely due to extrinsic compression from mass and lymphadenopathy.  Low clot burden and no CT evidence of right heart strain.  Mild patchy airspace disease in both lungs with focal consolidation in the left base which may reflect developing pneumonia, edema, or aspiration.  Mediastinal and  right hilar lymphadenopathy likely metastatic, increased from prior study.  Right posterior infrahilar mass consolidation, similar to prior study.   CT abdomen and pelvis with contrast revealed heterogeneous hypoenhancing liver lesion in segment of 7 measuring 2.3 cm likely metastatic, similar in size to prior study.  Focal sclerotic lesions in the left acetabulum and left hemipelvis possibly indicating bone metastasis.  Consider bone scan for further evaluation.  Suggestion of pyloric region wall thickening possibly representing gastritis or peptic ulcer disease.  Stool-filled moderately distended colon likely representing constipation.  Without wall thickening or inflammatory stranding.  Compression of the L3 vertebra new since prior study.  Moderate size esophageal hiatal hernia.   The time of visit, the patient is alert and oriented x 3.  Started on Eliquis .  He has no new complaints.  Assessment and Plan:  Acute pulmonary embolism RLE - Noted on CT scan, Pulmonary embolism in the right lower lobe pulmonary artery likely due to extrinsic compression from mass and lymphadenopathy. Low clot burden and no evidence of right heart strain.  No worsening hypoxia.  Initiated on Eliquis , 10 mg twice daily x 7 days then transition to 5 mg twice daily thereafter.  Patient will need to be on Eliquis  for at least 3 months.    Elevated troponin - Minimally elevated flat suggestive of demand ischemia.  No evidence of ischemia on twelve-lead EKG.  Chronic hypoxic respiratory failure - Continues on 2 L nasal cannula.  No worsening dyspnea.  COPD - Resume home regimen.  Does not appear to be in acute exacerbation.  Diabetes mellitus - Resume  home diabetic regiment.  Acute kidney injury on CKD 3B - Creatinine minimally elevated on presentation.  Showed improvement from 1.52 down to 1.31.   Consultants: None Procedures performed: None Disposition: Home Diet recommendation:  Discharge Diet Orders (From  admission, onward)     Start     Ordered   07/17/24 0000  Diet - low sodium heart healthy        07/17/24 1038           Cardiac and Carb modified diet  DISCHARGE MEDICATION: Allergies as of 07/17/2024       Reactions   Afinitor  [everolimus ] Swelling, Other (See Comments)   Angioedema- Patient was on this and Lisinopril  at the same time; had no trouble breathing, but throat became swollen   Lisinopril  Swelling, Other (See Comments)   Angioedema- on this and afinitor  same time; had no trouble breathing, but throat became swollen   Other Other (See Comments)   Certain seafoods cause gout flares   Rosuvastatin  Other (See Comments)   Chills and muscle aches   Statins Other (See Comments)   Joint pain   Simvastatin Other (See Comments)   Joint pain        Medication List     STOP taking these medications    Dexcom G7 Receiver Devi   Dexcom G7 Sensor Misc   rosuvastatin  5 MG tablet Commonly known as: Crestor        TAKE these medications    albuterol  108 (90 Base) MCG/ACT inhaler Commonly known as: VENTOLIN  HFA Inhale 2 puffs into the lungs every 4 (four) hours as needed for wheezing or shortness of breath.   allopurinol  100 MG tablet Commonly known as: ZYLOPRIM  TAKE 1 TABLET DAILY What changed: when to take this   amLODipine  10 MG tablet Commonly known as: NORVASC  Take 0.5 tablets (5 mg total) by mouth daily after supper.   Apixaban  Starter Pack (10mg  and 5mg ) Commonly known as: ELIQUIS  STARTER PACK Take as directed on package: start with two-5mg  tablets twice daily for 7 days. On day 8, switch to one-5mg  tablet twice daily.   ascorbic acid  500 MG tablet Commonly known as: VITAMIN C  Take 500 mg by mouth daily.   cyanocobalamin  1000 MCG tablet Commonly known as: VITAMIN B12 Take 1,000 mcg by mouth daily.   docusate sodium  100 MG capsule Commonly known as: COLACE Take 100 mg by mouth in the morning.   Dulcolax 5 MG EC tablet Generic drug:  bisacodyl  Take 5 mg by mouth daily as needed for moderate constipation.   feeding supplement (GLUCERNA SHAKE) Liqd Take 237 mLs by mouth 2 (two) times daily as needed (when not eating).   Gas-X Extra Strength 125 MG chewable tablet Generic drug: simethicone Chew 125 mg by mouth every 6 (six) hours as needed for flatulence.   Gaviscon Extra Strength 160-105 MG Chew Generic drug: Alum Hydroxide-Mag Carbonate Chew 1 tablet by mouth every 6 (six) hours as needed (for gas).   glipiZIDE  5 MG tablet Commonly known as: GLUCOTROL  Take 1 tablet (5 mg total) by mouth daily before breakfast.   glucose blood test strip Use to test blood sugar twice a day   guaiFENesin  600 MG 12 hr tablet Commonly known as: MUCINEX  Take 600 mg by mouth See admin instructions. Take 600 mg by mouth with breakfast and supper   Iron 325 (65 Fe) MG Tabs Take 325 mg by mouth See admin instructions. Take 325 mg by mouth every other night   ketotifen 0.025 %  ophthalmic solution Commonly known as: ZADITOR Place 1 drop into both eyes 2 (two) times daily as needed (allergies).   magnesium  hydroxide 400 MG/5ML suspension Commonly known as: MILK OF MAGNESIA Take 30 mLs by mouth daily as needed (constipation).   metFORMIN  500 MG tablet Commonly known as: GLUCOPHAGE  Take 1 tablet (500 mg total) by mouth daily with breakfast.   metoprolol  tartrate 25 MG tablet Commonly known as: LOPRESSOR  TAKE 1 TABLET TWICE A DAY What changed:  when to take this additional instructions   OXYGEN  Inhale 2 L/min into the lungs as needed (for shortness of breath).   pantoprazole  40 MG tablet Commonly known as: PROTONIX  TAKE 1 TABLET TWICE A DAY What changed: when to take this   polyethylene glycol 17 g packet Commonly known as: MIRALAX  / GLYCOLAX  Take 17 g by mouth daily as needed for mild constipation (into the suggested amount of water  - HOLD FOR DIARRHEA).   Stiolto Respimat  2.5-2.5 MCG/ACT Aers Generic drug: Tiotropium  Bromide-Olodaterol Inhale 2 puffs into the lungs daily.   Tylenol  8 Hour 650 MG CR tablet Generic drug: acetaminophen  Take 650 mg by mouth every 8 (eight) hours as needed for pain.   Vitamin D3 25 MCG (1000 UT) Caps Take 1,000 Units by mouth daily.         Discharge Exam: Filed Weights   07/16/24 1442  Weight: 72.6 kg    GENERAL:  Alert, pleasant, no acute distress, thin HEENT:  EOMI, nasal cannula CARDIOVASCULAR:  RRR, no murmurs appreciated RESPIRATORY:  Clear to auscultation, no wheezing, rales, or rhonchi GASTROINTESTINAL:  Soft, nontender, nondistended EXTREMITIES:  No LE edema bilaterally NEURO:  No new focal deficits appreciated SKIN:  No rashes noted PSYCH:  Appropriate mood and affect     Condition at discharge: improving  The results of significant diagnostics from this hospitalization (including imaging, microbiology, ancillary and laboratory) are listed below for reference.   Imaging Studies: CT Head Wo Contrast Result Date: 07/16/2024 EXAM: CT HEAD WITHOUT 07/16/2024 06:21:00 PM TECHNIQUE: CT of the head was performed without the administration of intravenous contrast. Automated exposure control, iterative reconstruction, and/or weight based adjustment of the mA/kV was utilized to reduce the radiation dose to as low as reasonably achievable. COMPARISON: None available. CLINICAL HISTORY: Head trauma, minor (Age >= 65y) FINDINGS: BRAIN AND VENTRICLES: No acute intracranial hemorrhage. No mass effect or midline shift. No extra-axial fluid collection. No evidence of acute infarct. No hydrocephalus. Age-related cerebral volume loss. Mild periventricular and deep cerebral white matter disease. ORBITS: Bilateral lens replacement. SINUSES AND MASTOIDS: No acute abnormality. SOFT TISSUES AND SKULL: No acute skull fracture. No acute soft tissue abnormality. IMPRESSION: 1. No acute intracranial abnormality. 2. Age-related cerebral volume loss. Electronically signed by:  Morene Hoard MD 07/16/2024 07:07 PM EST RP Workstation: [id number]   CT ABDOMEN PELVIS W CONTRAST Result Date: 07/16/2024 EXAM: CT ABDOMEN AND PELVIS WITH CONTRAST 07/16/2024 06:21:00 PM TECHNIQUE: CT of the abdomen and pelvis was performed with the administration of 80 mL of iohexol  (OMNIPAQUE ) 350 MG/ML injection. Multiplanar reformatted images are provided for review. Automated exposure control, iterative reconstruction, and/or weight-based adjustment of the mA/kV was utilized to reduce the radiation dose to as low as reasonably achievable. COMPARISON: CT chest abdomen and pelvis dated 04/07/2024. See additional report of CT chest and CT lumbar spine obtained contemporaneously. CLINICAL HISTORY: Abdominal pain, acute, nonlocalized; distention. FINDINGS: LOWER CHEST: Moderate-sized esophageal hiatal hernia. LIVER: Several focal liver lesions are demonstrated, with the most prominent being a heterogeneously hypoenhancing  lesion in segment 7 measuring 2.3 cm diameter. This is likely metastatic. The lesion is better defined than on previous studies due to contrast material today. Size appears similar. GALLBLADDER AND BILE DUCTS: Gallbladder is unremarkable. No biliary ductal dilatation. SPLEEN: No acute abnormality. PANCREAS: No acute abnormality. ADRENAL GLANDS: No acute abnormality. KIDNEYS, URETERS AND BLADDER: Nephrograms are symmetrical. Right renal cyst measuring 4.1 cm in diameter, unchanged. This is likely benign and no imaging follow-up is indicated. No stones in the kidneys or ureters. No hydronephrosis or hydroureter. No perinephric or periureteral stranding. The urinary bladder is normal. GI AND BOWEL: Gastric distention is normal. Suggestion of wall thickening in the pyloric region, which may represent gastritis or ulcer disease. The small bowel is decompressed. Stool-filled moderately distended colon likely representing constipation. No wall thickening or inflammatory stranding are  appreciated. Appendix is normal. PERITONEUM AND RETROPERITONEUM: No ascites. No free air. No mesenteric mass. VASCULATURE: Aorta is normal in caliber. Calcification of the aorta. No aneurysm. LYMPH NODES: No lymphadenopathy. REPRODUCTIVE ORGANS: Prostate gland is not enlarged. BONES AND SOFT TISSUES: Focal sclerotic lesions are demonstrated in the left acetabulum and left hemipelvis possibly indicating bone metastasis. Consider bone scan and follow-up for further evaluation. Compression of the L3 vertebra, new since prior study. No focal soft tissue abnormality. IMPRESSION: 1. Heterogeneously hypoenhancing liver lesion in segment 7 measuring 2.3 cm, likely metastatic, similar in size to prior study. 2. Focal sclerotic lesions in the left acetabulum and left hemipelvis, possibly indicating bone metastasis; consider bone scan for further evaluation. 3. Suggestion of pyloric region wall thickening, possibly representing gastritis or peptic ulcer disease. 4. Stool-filled moderately distended colon, likely representing constipation, without wall thickening or inflammatory stranding. 5. Compression of the L3 vertebra, new since prior study. 6. Moderate-sized esophageal hiatal hernia. Electronically signed by: Elsie Gravely MD 07/16/2024 07:04 PM EST RP Workstation: [id number]   CT L-SPINE NO CHARGE Result Date: 07/16/2024 EXAM: CT OF THE LUMBAR SPINE WITHOUT CONTRAST 07/16/2024 06:21:00 PM TECHNIQUE: CT of the lumbar spine was performed without the administration of intravenous contrast. Multiplanar reformatted images are provided for review. Automated exposure control, iterative reconstruction, and/or weight based adjustment of the mA/kV was utilized to reduce the radiation dose to as low as reasonably achievable. COMPARISON: Previous CT chest abdomen and pelvis 04/07/2024. CLINICAL HISTORY: Treatment. FINDINGS: BONES AND ALIGNMENT: New L3 compression deformity with cortical irregularities at the superior and  inferior endplates consistent with acute compression. There is about 20% loss of height. Other visualized vertebral body heights are maintained. No acute fracture or suspicious bone lesion, except for the L3 compression deformity. Normal alignment. No retrolisthesis or fracture fragments. Sacrum appears intact. DEGENERATIVE CHANGES: Degenerative changes in the spine with narrowed interspaces and endplate osteophyte formation most prominent at L3, L4, and L5. SOFT TISSUES: No acute abnormality. No abnormal paraspinal soft tissue mass or infiltration. Also see additional report of CT abdomen and pelvis obtained contemporaneously. IMPRESSION: 1. Acute L3 compression deformity with approximately 20% loss of height and cortical irregularities at the superior and inferior endplates, without retrolisthesis or fracture fragments. 2. Degenerative changes with interspace narrowing and endplate osteophyte formation most prominent at L3L5. Electronically signed by: Elsie Gravely MD 07/16/2024 06:58 PM EST RP Workstation: [id number]   CT Angio Chest PE W and/or Wo Contrast Result Date: 07/16/2024 EXAM: CTA CHEST 07/16/2024 06:21:00 PM TECHNIQUE: CTA of the chest was performed after the administration of intravenous contrast. Multiplanar reformatted images are provided for review. MIP images are provided for review. Automated exposure  control, iterative reconstruction, and/or weight based adjustment of the mA/kV was utilized to reduce the radiation dose to as low as reasonably achievable. COMPARISON: Chest radiograph 07/16/2024 and CT chest 04/07/2024. CLINICAL HISTORY: Pulmonary embolism (PE) suspected, high prob. Shortness of breath, lower worse over the last week. History of stage 4 lung cancer. Cough. Pneumococcal vaccine in use. FINDINGS: PULMONARY ARTERIES: Pulmonary arteries are adequately opacified for evaluation. Positive examination for pulmonary emboli with filling defect in the right lower lobe pulmonary  artery, likely caused by extrinsic compression of the vessel due to adjacent mass and. lymphadenopathy. Pulmonary arteries are otherwise patent. Clot burden is low. Main pulmonary artery is normal in caliber. MEDIASTINUM: Cardiac enlargement with evidence of left ventricular hypertrophy. No pericardial effusion. No evidence of right heart strain. Calcification of the aorta and coronary arteries. No aortic aneurysm. LYMPH NODES: Mediastinal and right hilar lymphadenopathy. Largest aortopulmonic window lymph nodes measure up to 1.7 cm diameter, progressing since prior study. Right hilar lymph nodes measure up to 1.5 cm short axis dimension, likely progressing since the prior study, although comparison is difficult due to differences in contrast. No axillary lymphadenopathy. LUNGS AND PLEURA: Right posterior infrahilar mass and consolidation are again demonstrated, likely post-subtractive change. This measures about 3.5 x 3.9 cm diameter, similar to prior study. Diffuse emphysematous changes in the lungs. New since previous study, there is patchy airspace disease throughout both lungs with focal consolidation in the left base, this may represent developing pneumonia or edema. Aspiration could also be a possibility. Bronchial wall thickening centrally, more prominent on the left. Small right pleural effusion. No pneumothorax. UPPER ABDOMEN: For upper abdomen results, see additional report of CT abdomen and pelvis obtained contemporaneously. SOFT TISSUES AND BONES: No focal bone lesions. No acute soft tissue abnormality. IMPRESSION: 1. Pulmonary embolism in the right lower lobe pulmonary artery likely due to extrinsic compression from mass and lymphadenopathy. Low clot burden and no CT evidence of right heart strain. 2. New patchy airspace disease in both lungs with focal consolidation in the left base, which may reflect developing pneumonia, edema, or aspiration. 3. Mediastinal and right hilar lymphadenopathy, likely  metastatic, increased from prior study. 4. Right posterior infrahilar mass and consolidation, similar to the prior study. Electronically signed by: Elsie Gravely MD 07/16/2024 06:55 PM EST RP Workstation: [id number]   DG Chest 2 View Result Date: 07/16/2024 CLINICAL DATA:  Shortness of breath. EXAM: CHEST - 2 VIEW COMPARISON:  Chest radiograph dated 03/07/2023. FINDINGS: Diffuse chronic intra coarsening and bibasilar atelectasis/scarring. No focal consolidation or pneumothorax. Trace right pleural effusion. Stable cardiac silhouette. No acute osseous pathology. IMPRESSION: 1. No acute cardiopulmonary process. 2. Trace right pleural effusion. Electronically Signed   By: Vanetta Chou M.D.   On: 07/16/2024 16:30   VAS US  ABI WITH/WO TBI Result Date: 06/29/2024  LOWER EXTREMITY DOPPLER STUDY Patient Name:  Cammie Sake  Date of Exam:   06/27/2024 Medical Rec #: [id number]        Accession #:    [id number] Date of Birth: 06-01-1946        Patient Gender: M Patient Age:   17 years Exam Location:  [address] Procedure:      VAS US  ABI WITH/WO TBI Referring Phys: Royden Cedar --------------------------------------------------------------------------------  Indications: decreased pedal pulses High Risk         Hypertension, hyperlipidemia, Diabetes, past history of Factors:          smoking.  Performing Technologist: Stoney Ross RVT  Examination Guidelines: A complete evaluation  includes at minimum, Doppler waveform signals and systolic blood pressure reading at the level of bilateral brachial, anterior tibial, and posterior tibial arteries, when vessel segments are accessible. Bilateral testing is considered an integral part of a complete examination. Photoelectric Plethysmograph (PPG) waveforms and toe systolic pressure readings are included as required and additional duplex testing as needed. Limited examinations for reoccurring indications may be performed as noted.  ABI Findings:  +---------+------------------+-----+-----------+--------+ Right    Rt Pressure (mmHg)IndexWaveform   Comment  +---------+------------------+-----+-----------+--------+ Brachial 155                                        +---------+------------------+-----+-----------+--------+ ATA      99                0.61 multiphasic         +---------+------------------+-----+-----------+--------+ PTA      164               1.01 monophasic          +---------+------------------+-----+-----------+--------+ Great Toe99                0.61                     +---------+------------------+-----+-----------+--------+ +---------+------------------+-----+-------------------+-------+ Left     Lt Pressure (mmHg)IndexWaveform           Comment +---------+------------------+-----+-------------------+-------+ Brachial 163                                               +---------+------------------+-----+-------------------+-------+ ATA      152               0.93 dampened monophasic        +---------+------------------+-----+-------------------+-------+ PTA      151               0.93 dampened monophasic        +---------+------------------+-----+-------------------+-------+ Great Toe112               0.69                            +---------+------------------+-----+-------------------+-------+ +-------+-----------+-----------+------------+------------+ ABI/TBIToday's ABIToday's TBIPrevious ABIPrevious TBI +-------+-----------+-----------+------------+------------+ Right  1.01       0.61                                +-------+-----------+-----------+------------+------------+ Left   0.93       0.69                                +-------+-----------+-----------+------------+------------+  Summary: Right: Resting right ankle-brachial index is within normal range. Although ankle brachial indices are within normal limits (0.95-1.29), arterial Doppler waveforms at  the ankle suggest some component of arterial occlusive disease. Right toe pressure is >60 mmHg which suggests adequate perfusion for healing. Left: Resting left ankle-brachial index indicates mild left lower extremity arterial disease. Dampened monophasic Doppler waveforms at the ankle suggest pressures may be falsely elevated.  Left toe pressure is >60 mmHg which suggests adequate perfusion for healing.  *See table(s) above for measurements and observations.  Electronically signed by Debby  Hawken on 06/29/2024 at 2:23:06 PM.    Final     Microbiology: Results for orders placed or performed during the hospital encounter of 01/29/23  Culture, blood (routine x 2)     Status: None   Collection Time: 01/29/23  2:37 PM   Specimen: BLOOD  Result Value Ref Range Status   Specimen Description   Final    BLOOD SPECIMEN SOURCE NOT MARKED ON REQUISITION Performed at [facility], [address] [place], [place] [address]    Special Requests   Final    BOTTLES DRAWN AEROBIC AND ANAEROBIC Blood Culture results may not be optimal due to an excessive volume of blood received in culture bottles Performed at [facility], [address] [place], [place] [address]    Culture   Final    NO GROWTH 5 DAYS Performed at [facility], [address], [place], [place] [address]    Report Status 02/03/2023 FINAL  Final  Culture, blood (routine x 2)     Status: None   Collection Time: 01/29/23  3:02 PM   Specimen: BLOOD  Result Value Ref Range Status   Specimen Description   Final    BLOOD BLOOD RIGHT FOREARM Performed at [facility], [address], [place], [place] [address]    Special Requests   Final    BOTTLES DRAWN AEROBIC AND ANAEROBIC Blood Culture results may not be optimal due to an excessive volume of blood received in culture bottles Performed at [facility], [address], [place], [place] [address]    Culture    Final    NO GROWTH 5 DAYS Performed at [facility], [address] [place], [place] [address]    Report Status 02/03/2023 FINAL  Final  SARS Coronavirus 2 by RT PCR (hospital order, performed in [facility] hospital lab) *cepheid single result test* Anterior Nasal Swab     Status: None   Collection Time: 01/29/23  3:19 PM   Specimen: Anterior Nasal Swab  Result Value Ref Range Status   SARS Coronavirus 2 by RT PCR NEGATIVE NEGATIVE Final    Comment: (NOTE) SARS-CoV-2 target nucleic acids are NOT DETECTED.  The SARS-CoV-2 RNA is generally detectable in upper and lower respiratory specimens during the acute phase of infection. The lowest concentration of SARS-CoV-2 viral copies this assay can detect is 250 copies / mL. A negative result does not preclude SARS-CoV-2 infection and should not be used as the sole basis for treatment or other patient management decisions.  A negative result may occur with improper specimen collection / handling, submission of specimen other than nasopharyngeal swab, presence of viral mutation(s) within the areas targeted by this assay, and inadequate number of viral copies (<250 copies / mL). A negative result must be combined with clinical observations, patient history, and epidemiological information.  Fact Sheet for Patients:   roadlaptop.co.za  Fact Sheet for Healthcare Providers: http://kim-miller.com/  This test is not yet approved or  cleared by the [place]  FDA and has been authorized for detection and/or diagnosis of SARS-CoV-2 by FDA under an Emergency Use Authorization (EUA).  This EUA will remain in effect (meaning this test can be used) for the duration of the COVID-19 declaration under Section 564(b)(1) of the Act, 21 U.S.C. section 360bbb-3(b)(1), unless the authorization is terminated or revoked sooner.  Performed at [facility], [address], [place], [place] [address]   Urine Culture  Status: None   Collection Time: 01/29/23  4:41 PM   Specimen: Urine, Clean Catch  Result Value Ref Range Status   Specimen Description   Final    URINE, CLEAN CATCH Performed at [facility], [address], [place], [place] [address]    Special Requests   Final    NONE Performed at [facility], [address], [place], [place] [address]    Culture   Final    NO GROWTH Performed at [facility], [address] [place], [place] [address]    Report Status 01/30/2023 FINAL  Final   *Note: Due to a large number of results and/or encounters for the requested time period, some results have not been displayed. A complete set of results can be found in Results Review.    Labs: CBC: Recent Labs  Lab 07/16/24 1446 07/17/24 0541  WBC 9.0 6.7  HGB 13.3 12.3*  HCT 40.3 38.1*  MCV 89.8 91.6  PLT 181 165   Basic Metabolic Panel: Recent Labs  Lab 07/16/24 1446 07/16/24 1707 07/17/24 0541  NA 138  --  138  K 3.7  --  3.7  CL 104  --  104  CO2 21*  --  26  GLUCOSE 165*  --  112*  BUN 23  --  16  CREATININE 1.52*  --  1.31*  CALCIUM  9.1  --  9.3  MG  --  2.0 2.0  PHOS  --   --  2.5   Liver Function Tests: Recent Labs  Lab 07/16/24 1707  AST 16  ALT 8  ALKPHOS 72  BILITOT 0.3  PROT 7.4  ALBUMIN 3.4*   CBG: Recent Labs  Lab 07/16/24 2334 07/17/24 0802  GLUCAP 93 126*    Discharge time spent: 32 minutes.  Length of inpatient stay: 1 days  Signed: Carliss LELON Canales, DO [facility] 07/17/2024

## 2024-07-17 NOTE — Plan of Care (Signed)
  Problem: Fluid Volume: Goal: Ability to maintain a balanced intake and output will improve Outcome: Progressing   Problem: Metabolic: Goal: Ability to maintain appropriate glucose levels will improve Outcome: Progressing   Problem: Nutritional: Goal: Maintenance of adequate nutrition will improve Outcome: Progressing   Problem: Skin Integrity: Goal: Risk for impaired skin integrity will decrease Outcome: Progressing   Problem: Education: Goal: Knowledge of General Education information will improve Description: Including pain rating scale, medication(s)/side effects and non-pharmacologic comfort measures Outcome: Progressing

## 2024-07-18 ENCOUNTER — Other Ambulatory Visit (HOSPITAL_COMMUNITY): Payer: Self-pay

## 2024-07-21 LAB — CG4 I-STAT (LACTIC ACID): Lactic Acid, Venous: 2.2 mmol/L (ref 0.5–1.9)

## 2024-07-28 ENCOUNTER — Ambulatory Visit: Admitting: Family Medicine

## 2024-07-31 ENCOUNTER — Other Ambulatory Visit (HOSPITAL_COMMUNITY): Payer: Self-pay

## 2024-07-31 ENCOUNTER — Ambulatory Visit: Admitting: Family Medicine

## 2024-07-31 ENCOUNTER — Ambulatory Visit: Payer: Self-pay | Admitting: Family Medicine

## 2024-07-31 ENCOUNTER — Telehealth: Payer: Self-pay | Admitting: Family Medicine

## 2024-07-31 ENCOUNTER — Encounter: Payer: Self-pay | Admitting: Family Medicine

## 2024-07-31 VITALS — BP 112/68 | HR 86 | Temp 97.8°F | Ht 75.0 in

## 2024-07-31 DIAGNOSIS — Z7984 Long term (current) use of oral hypoglycemic drugs: Secondary | ICD-10-CM | POA: Diagnosis not present

## 2024-07-31 DIAGNOSIS — I2699 Other pulmonary embolism without acute cor pulmonale: Secondary | ICD-10-CM

## 2024-07-31 DIAGNOSIS — C7B8 Other secondary neuroendocrine tumors: Secondary | ICD-10-CM | POA: Diagnosis not present

## 2024-07-31 DIAGNOSIS — I10 Essential (primary) hypertension: Secondary | ICD-10-CM

## 2024-07-31 DIAGNOSIS — C7A8 Other malignant neuroendocrine tumors: Secondary | ICD-10-CM | POA: Diagnosis not present

## 2024-07-31 DIAGNOSIS — S32030A Wedge compression fracture of third lumbar vertebra, initial encounter for closed fracture: Secondary | ICD-10-CM | POA: Diagnosis not present

## 2024-07-31 DIAGNOSIS — N1832 Chronic kidney disease, stage 3b: Secondary | ICD-10-CM

## 2024-07-31 DIAGNOSIS — E1122 Type 2 diabetes mellitus with diabetic chronic kidney disease: Secondary | ICD-10-CM | POA: Diagnosis not present

## 2024-07-31 LAB — POCT GLYCOSYLATED HEMOGLOBIN (HGB A1C): Hemoglobin A1C: 6.5 % — AB (ref 4.0–5.6)

## 2024-07-31 MED ORDER — APIXABAN 5 MG PO TABS
5.0000 mg | ORAL_TABLET | Freq: Two times a day (BID) | ORAL | 11 refills | Status: DC
  Filled 2024-07-31: qty 60, 30d supply, fill #0

## 2024-07-31 NOTE — Patient Instructions (Addendum)
 Increase fluid intake. Start miralax  back 1 capful per day to see if we can help with bowel movement   I'm reaching out to oncology Dr. Sherrod about possible bone scan  Point of Care (POC) a1c before you go  Eating would be helpful to keep your strength up  Eliquis  long term unless Dr. Gatha says otherwise. I sent this in for you  Please stop by lab before you go. If they cannot get urine today- can bring back If you have mychart- we will send your results within 3 business days of us  receiving them.  If you do not have mychart- we will call you about results within 5 business days of us  receiving them.  *please also note that you will see labs on mychart as soon as they post. I will later go in and write notes on them- will say notes from Dr. Katrinka   Recommended follow up: Return in about 4 months (around 11/29/2024) for followup or sooner if needed.Schedule b4 you leave.

## 2024-07-31 NOTE — Telephone Encounter (Signed)
 Patient scheduled to see you today. Please see message below.    Copied from CRM #[id number]. Topic: General - Other >> Jul 31, 2024 10:44 AM Macario HERO wrote: Reason for CRM: Caityln PTA from [facility] called to let Dr. Katrinka know patient has not had a bowel movement since last Saturday. He has an appointment today and may need an orthopedic referral from last ER visit. Call back: [phone]

## 2024-07-31 NOTE — Progress Notes (Signed)
 Phone [phone] In person visit   Subjective:   Richard Navarro is a 78 y.o. year old very pleasant male patient who presents for/with See problem oriented charting Chief Complaint  Patient presents with   Diabetes   Hypertension   Medical Management of Chronic Issues    Would like to discuss some specialty appts; has questions about eliquis  refills/ instructions; appetite is down, only eating breakfast regularly; has not had a bowel movement since Saturday, no stomach pain, occasional urge to use the restroom but cannot, taking colace every morning;     Past Medical History-  Patient Active Problem List   Diagnosis Date Noted   Acute pulmonary embolism (HCC) 07/16/2024    Priority: High   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    Priority: High   Chronic hypoxemic respiratory failure (HCC) 08/04/2021    Priority: High   Anemia 05/07/2018    Priority: High   Chronic kidney disease (CKD) stage G3b/A1, moderately decreased glomerular filtration rate (GFR) between 30-44 mL/min/1.73 square meter and albuminuria creatinine ratio less than 30 mg/g (HCC) 03/04/2018    Priority: High   Neuroendocrine carcinoma (HCC) 08/09/2017    Priority: High   Primary cancer of right lung (HCC) 12/22/2016    Priority: High   Diabetes mellitus with renal manifestation (HCC)     Priority: High   Myalgia due to statin 03/04/2018    Priority: Medium    COPD (chronic obstructive pulmonary disease) (HCC) 12/01/2016    Priority: Medium    PTSD (post-traumatic stress disorder) 12/07/2015    Priority: Medium    Erectile dysfunction 04/09/2014    Priority: Medium    Former smoker 03/26/2014    Priority: Medium    Essential hypertension     Priority: Medium    Gout     Priority: Medium    Depression     Priority: Medium    Hyperlipidemia     Priority: Medium    Lobar pneumonia 08/09/2017    Priority: Low   Encounter for antineoplastic chemotherapy 01/10/2017     Priority: Low   Goals of care, counseling/discussion 01/10/2017    Priority: Low   Prostate cancer screening 04/17/2014    Priority: Low   Arthritis 03/26/2014    Priority: Low   History of adenomatous polyp of colon 03/26/2014    Priority: Low   GERD (gastroesophageal reflux disease)     Priority: Low   Carpal tunnel syndrome 10/26/2020    Priority: 1.   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 3 each 2   allopurinol  (ZYLOPRIM ) 100 MG tablet TAKE 1 TABLET DAILY 90 tablet 3   amLODipine  (NORVASC ) 10 MG tablet Take 0.5 tablets (5 mg total) by mouth daily after supper.     apixaban  (ELIQUIS ) 5 MG TABS tablet Take 1 tablet (5 mg total) by mouth 2 (two) times daily. 60  tablet 11   Cholecalciferol  (VITAMIN D3) 25 MCG (1000 UT) CAPS Take 1,000 Units by mouth daily.     docusate sodium  (COLACE) 100 MG capsule Take 100 mg by mouth in the morning.     DULCOLAX 5 MG EC tablet Take 5 mg by mouth daily as needed for moderate constipation.     feeding supplement, GLUCERNA SHAKE, (GLUCERNA SHAKE) LIQD Take 237 mLs by mouth 2 (two) times daily as needed (when not eating).     Ferrous Sulfate  (IRON) 325 (65 Fe) MG TABS Take 325 mg by mouth See admin instructions. Take 325 mg by mouth every other night     GAS-X EXTRA STRENGTH 125 MG chewable tablet Chew 125 mg by mouth every 6 (six) hours as  needed for flatulence.     GAVISCON EXTRA STRENGTH 160-105 MG CHEW Chew 1 tablet by mouth every 6 (six) hours as needed (for gas).     glipiZIDE  (GLUCOTROL ) 5 MG tablet Take 1 tablet (5 mg total) by mouth daily before breakfast. 90 tablet 3   glucose blood test strip Use to test blood sugar twice a day 200 each 3   guaiFENesin  (MUCINEX ) 600 MG 12 hr tablet Take 600 mg by mouth See admin instructions. Take 600 mg by mouth with breakfast and supper     magnesium  hydroxide (MILK OF MAGNESIA) 400 MG/5ML suspension Take 30 mLs by mouth daily as needed (constipation).     metFORMIN  (GLUCOPHAGE ) 500 MG tablet Take 1 tablet (500 mg total) by mouth daily with breakfast. 90 tablet 3   metoprolol  tartrate (LOPRESSOR ) 25 MG tablet TAKE 1 TABLET TWICE A DAY 180 tablet 3   OXYGEN  Inhale 2 L/min into the lungs as needed (for shortness of breath).     pantoprazole  (PROTONIX ) 40 MG tablet TAKE 1 TABLET TWICE A DAY 180 tablet 3   polyethylene glycol (MIRALAX  / GLYCOLAX ) 17 g packet Take 17 g by mouth daily as needed for mild constipation (into the suggested amount of water  - HOLD FOR DIARRHEA).     Tiotropium Bromide -Olodaterol (STIOLTO RESPIMAT ) 2.5-2.5 MCG/ACT AERS Inhale 2 puffs into the lungs daily. 4 g 11   TYLENOL  8 HOUR 650 MG CR tablet Take 650 mg by mouth every 8 (eight) hours as needed for pain.     vitamin B-12 (CYANOCOBALAMIN ) 1000 MCG tablet Take 1,000 mcg by mouth daily.      vitamin C  (ASCORBIC ACID ) 500 MG tablet Take 500 mg by mouth daily.     ketotifen (ZADITOR) 0.025 % ophthalmic solution Place 1 drop into both eyes 2 (two) times daily as needed (allergies). (Patient not taking: Reported on 07/31/2024)     No current facility-administered medications for this visit.     Objective:  BP 112/68 (BP Location: Left Arm, Patient Position: Sitting, Cuff Size: Normal)   Pulse 86   Temp 97.8 F (36.6 C) (Temporal)   Ht 6' 3 (1.905 m)   SpO2 96%   BMI 20.00 kg/m  Gen: NAD, resting comfortably  but appears thinner. Has his oxygen  with him but not actively wearing CV: RRR no murmurs rubs or gallops Lungs: CTAB no crackles, wheeze, rhonchi Ext: no edema Skin: warm, dry Neuro: in wheelchair    Assessment and Plan   # Hospital follow-up-does not appear a TCM phone call was made by staff  # Pulmonary embolism S: Noted 07/16/2024 emergency department and hospitalized.  Thankfully no right heart strain.  No hypoxia.  Started on Eliquis  10 mg twice daily  for 7 days then transition to 5 mg ongoing with plan for minimum 3 months of treatment.  Mild troponin elevation due to demand ischemia.  On 2 L oxygen  just per baseline but he reports mainly with activity  A/P: PULMONARY EMBOLISM is being appropriately treated with Eliquis -has already finished the 7 days of 10 mg twice daily and he will be on 5 mg long-term twice daily.  We discussed with ongoing cancer would likely be difficult to take him off of the medication but would defer ultimately to oncology -He has lost significant weight during this time and reports his appetite has been down.  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 Sherrod  # Fall-course to hospitalization actually started out after a fall 4 days prior and he had ongoing back pain mostly found to have L3 compression fracture-reports ongoing right-sided back pain but working well with physical therapy and Tylenol  is controlling pain.  Offered orthopedic  referral but he declines.  He is using a back brace.  Discussed potential 6 to 12 weeks to heal  #constipation No bowel movement since Saturday but no abdominal pain.  Occasional urge to use the restroom to defecate but cannot.  Taking Colace every morning. hydration status probably less than adequate. Dulcolax once. He had been using miralax  before his hospitalization but has stopped.  Milk of magnesia also listed but not using.  # Possible pneumonia-we reviewed CT scan showing2. New patchy airspace disease in both lungs with focal consolidation in the left base, which may reflect developing pneumonia, edema, or aspiration.  Patient denies any cough or fever.  He states he was not treated for potential pneumonia.  His breathing is at his baseline.  He prefers to remain off of treatment at this time.   # Neuroendocrine carcinoma/lung cancer-currently on break from treatment but this may have to be reconsidered 1 Dr. 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  Pending MRI of the brain.  MoCA 22 out of 30 on 07/08/2024 but pending neuropsychological assessment   #COPD- on stiolto and sees Dr. Stewart overall stable-has home oxygen  mainly for activity -was given incruse when left hospital November 2025 but we will see wihat pulmonary says that she is going to hold off on taking this   #Chronic kidney disease stage IIIb S: Patient with significant worsening in kidney function dating back to November 2020-we have actually written a letter to the [facility] explaining how cancer treatment affected kidney function.  Patient follows with [facility] -GFR typically in the 40s with creatinine of 1.4-1.7 range A/P: Interestingly enough mild improvement actually in the hospital-we will see if this persist and recheck next visit  #Diabetes mellitus S: medication(s): glipizide  5 mg daily, restart metformin  500 mg daily August 2025  Lab Results   Component Value Date   HGBA1C 6.5 (A) 07/31/2024   HGBA1C 7.9 (H) 03/27/2024   HGBA1C 6.6 (A) 09/27/2023  A/P: Well-controlled today and no lows  reported-continue current medication  #Hypertension  S: Compliant with amlodipine  10 mg and metoprolol  25 mg twice daily A/P: Reasonable control-continue current medication  # Hyperlipidemia but significant myalgias on statins-remaining off of treatment  Recommended follow up: Return in about 4 months (around 11/29/2024) for followup or sooner if needed.Schedule b4 you leave. Future Appointments  Date Time Provider Department Center  08/04/2024  4:40 PM GI-315 MR 3 [facility]-315MRI [facility]  08/26/2024  3:00 PM Gaynel Delon CROME, DPM [facility]  09/17/2024  1:00 PM Kdeiss, Bianca [facility] None  09/17/2024  2:00 PM [facility] NEUROPSYCH TECH2 [facility] None  09/24/2024  2:30 PM Kdeiss, Bianca [facility] None  10/07/2024 10:45 AM [facility] None  10/07/2024 11:30 AM [facility]-CT 2 [facility] [place]  10/08/2024  2:30 PM Dina Camie BRAVO, PA-C [facility] None  10/14/2024 11:00 AM Sherrod Sherrod, MD [facility] None  11/12/2024  1:45 PM Shelah Lamar RAMAN, MD [facility] [address]  12/05/2024  2:00 PM Katrinka Garnette KIDD, MD [facility]  01/12/2025 10:40 AM [facility] ANNUAL WELLNESS VISIT 1 [facility] [place]    Lab/Order associations:   ICD-10-CM   1. Acute pulmonary embolism without acute cor pulmonale, unspecified pulmonary embolism type (HCC)  I26.99     2. Type 2 diabetes mellitus with stage 3b chronic kidney disease, without long-term current use of insulin  (HCC)  E11.22 Microalbumin / creatinine urine ratio   N18.32 POCT HgB A1C    3. 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 Neuroendocrine carcinoma (HCC)  C7A.8     5. Essential hypertension  I10     6. Closed compression fracture of L3 lumbar vertebra, initial encounter (HCC)  S32.030A       Meds ordered this encounter  Medications    apixaban  (ELIQUIS ) 5 MG TABS tablet    Sig: Take 1 tablet (5 mg total) by mouth 2 (two) times daily.    Dispense:  60 tablet    Refill:  11   I personally spent a total of 45 minutes in the care of the patient today including preparing to see the patient, getting/reviewing separately obtained history, performing a medically appropriate exam/evaluation, counseling and educating on multiple in hospital related questions as well as questions about ongoing care such as potential long term need for eliquis , and documenting clinical information in the EHR.  Return precautions advised.  Garnette Katrinka, MD

## 2024-08-04 ENCOUNTER — Encounter: Payer: Self-pay | Admitting: Family Medicine

## 2024-08-04 ENCOUNTER — Ambulatory Visit
Admission: RE | Admit: 2024-08-04 | Discharge: 2024-08-04 | Disposition: A | Source: Ambulatory Visit | Attending: Physician Assistant | Admitting: Physician Assistant

## 2024-08-06 ENCOUNTER — Other Ambulatory Visit: Payer: Self-pay

## 2024-08-06 ENCOUNTER — Other Ambulatory Visit (HOSPITAL_COMMUNITY): Payer: Self-pay

## 2024-08-06 MED FILL — Allopurinol Tab 100 MG: ORAL | 90 days supply | Qty: 90 | Fill #1 | Status: AC

## 2024-08-10 ENCOUNTER — Ambulatory Visit: Payer: Self-pay | Admitting: Physician Assistant

## 2024-08-13 ENCOUNTER — Other Ambulatory Visit: Payer: Self-pay

## 2024-08-13 MED ORDER — STIOLTO RESPIMAT 2.5-2.5 MCG/ACT IN AERS: 2.0000 | INHALATION_SPRAY | Freq: Every day | RESPIRATORY_TRACT | 11 refills | Status: AC

## 2024-08-13 MED ORDER — PANTOPRAZOLE SODIUM 40 MG PO TBEC: 40.0000 mg | DELAYED_RELEASE_TABLET | Freq: Two times a day (BID) | ORAL | 3 refills | Status: AC

## 2024-08-13 MED ORDER — APIXABAN 5 MG PO TABS: 5.0000 mg | ORAL_TABLET | Freq: Two times a day (BID) | ORAL | 3 refills | Status: AC

## 2024-08-14 ENCOUNTER — Inpatient Hospital Stay (HOSPITAL_COMMUNITY)
Admission: EM | Admit: 2024-08-14 | Discharge: 2024-08-19 | DRG: 871 | Disposition: A | Discharge status: Home Health | Attending: Internal Medicine | Admitting: Internal Medicine

## 2024-08-14 ENCOUNTER — Emergency Department (HOSPITAL_COMMUNITY)

## 2024-08-14 ENCOUNTER — Other Ambulatory Visit: Payer: Self-pay

## 2024-08-14 DIAGNOSIS — J449 Chronic obstructive pulmonary disease, unspecified: Secondary | ICD-10-CM | POA: Diagnosis present

## 2024-08-14 DIAGNOSIS — J9601 Acute respiratory failure with hypoxia: Secondary | ICD-10-CM | POA: Diagnosis present

## 2024-08-14 DIAGNOSIS — Z833 Family history of diabetes mellitus: Secondary | ICD-10-CM

## 2024-08-14 DIAGNOSIS — Z79899 Other long term (current) drug therapy: Secondary | ICD-10-CM

## 2024-08-14 DIAGNOSIS — J440 Chronic obstructive pulmonary disease with acute lower respiratory infection: Secondary | ICD-10-CM | POA: Diagnosis present

## 2024-08-14 DIAGNOSIS — J439 Emphysema, unspecified: Secondary | ICD-10-CM | POA: Diagnosis present

## 2024-08-14 DIAGNOSIS — Z9841 Cataract extraction status, right eye: Secondary | ICD-10-CM

## 2024-08-14 DIAGNOSIS — D649 Anemia, unspecified: Secondary | ICD-10-CM | POA: Diagnosis present

## 2024-08-14 DIAGNOSIS — Z85118 Personal history of other malignant neoplasm of bronchus and lung: Secondary | ICD-10-CM

## 2024-08-14 DIAGNOSIS — Z7984 Long term (current) use of oral hypoglycemic drugs: Secondary | ICD-10-CM

## 2024-08-14 DIAGNOSIS — E785 Hyperlipidemia, unspecified: Secondary | ICD-10-CM | POA: Diagnosis present

## 2024-08-14 DIAGNOSIS — E1129 Type 2 diabetes mellitus with other diabetic kidney complication: Secondary | ICD-10-CM | POA: Diagnosis present

## 2024-08-14 DIAGNOSIS — E8809 Other disorders of plasma-protein metabolism, not elsewhere classified: Secondary | ICD-10-CM | POA: Diagnosis present

## 2024-08-14 DIAGNOSIS — D509 Iron deficiency anemia, unspecified: Secondary | ICD-10-CM | POA: Diagnosis present

## 2024-08-14 DIAGNOSIS — D638 Anemia in other chronic diseases classified elsewhere: Secondary | ICD-10-CM | POA: Diagnosis present

## 2024-08-14 DIAGNOSIS — Z9981 Dependence on supplemental oxygen: Secondary | ICD-10-CM

## 2024-08-14 DIAGNOSIS — R6520 Severe sepsis without septic shock: Secondary | ICD-10-CM | POA: Diagnosis present

## 2024-08-14 DIAGNOSIS — J9621 Acute and chronic respiratory failure with hypoxia: Secondary | ICD-10-CM | POA: Diagnosis present

## 2024-08-14 DIAGNOSIS — J9811 Atelectasis: Secondary | ICD-10-CM | POA: Diagnosis present

## 2024-08-14 DIAGNOSIS — J9611 Chronic respiratory failure with hypoxia: Secondary | ICD-10-CM | POA: Diagnosis present

## 2024-08-14 DIAGNOSIS — Z86711 Personal history of pulmonary embolism: Secondary | ICD-10-CM

## 2024-08-14 DIAGNOSIS — R531 Weakness: Secondary | ICD-10-CM

## 2024-08-14 DIAGNOSIS — K219 Gastro-esophageal reflux disease without esophagitis: Secondary | ICD-10-CM | POA: Diagnosis present

## 2024-08-14 DIAGNOSIS — A419 Sepsis, unspecified organism: Principal | ICD-10-CM | POA: Diagnosis present

## 2024-08-14 DIAGNOSIS — F411 Generalized anxiety disorder: Secondary | ICD-10-CM | POA: Diagnosis present

## 2024-08-14 DIAGNOSIS — I2699 Other pulmonary embolism without acute cor pulmonale: Secondary | ICD-10-CM | POA: Diagnosis present

## 2024-08-14 DIAGNOSIS — F32A Depression, unspecified: Secondary | ICD-10-CM | POA: Diagnosis present

## 2024-08-14 DIAGNOSIS — N1832 Chronic kidney disease, stage 3b: Secondary | ICD-10-CM | POA: Diagnosis present

## 2024-08-14 DIAGNOSIS — Z800 Family history of malignant neoplasm of digestive organs: Secondary | ICD-10-CM

## 2024-08-14 DIAGNOSIS — Z8249 Family history of ischemic heart disease and other diseases of the circulatory system: Secondary | ICD-10-CM

## 2024-08-14 DIAGNOSIS — J90 Pleural effusion, not elsewhere classified: Secondary | ICD-10-CM | POA: Diagnosis present

## 2024-08-14 DIAGNOSIS — Z9842 Cataract extraction status, left eye: Secondary | ICD-10-CM

## 2024-08-14 DIAGNOSIS — M109 Gout, unspecified: Secondary | ICD-10-CM | POA: Diagnosis present

## 2024-08-14 DIAGNOSIS — I129 Hypertensive chronic kidney disease with stage 1 through stage 4 chronic kidney disease, or unspecified chronic kidney disease: Secondary | ICD-10-CM | POA: Diagnosis present

## 2024-08-14 DIAGNOSIS — E1122 Type 2 diabetes mellitus with diabetic chronic kidney disease: Secondary | ICD-10-CM | POA: Diagnosis present

## 2024-08-14 DIAGNOSIS — E872 Acidosis, unspecified: Secondary | ICD-10-CM | POA: Diagnosis present

## 2024-08-14 DIAGNOSIS — J189 Pneumonia, unspecified organism: Secondary | ICD-10-CM | POA: Diagnosis not present

## 2024-08-14 DIAGNOSIS — Z888 Allergy status to other drugs, medicaments and biological substances status: Secondary | ICD-10-CM

## 2024-08-14 DIAGNOSIS — Z1152 Encounter for screening for COVID-19: Secondary | ICD-10-CM

## 2024-08-14 DIAGNOSIS — I10 Essential (primary) hypertension: Secondary | ICD-10-CM | POA: Diagnosis present

## 2024-08-14 DIAGNOSIS — C3431 Malignant neoplasm of lower lobe, right bronchus or lung: Secondary | ICD-10-CM | POA: Diagnosis present

## 2024-08-14 DIAGNOSIS — Z801 Family history of malignant neoplasm of trachea, bronchus and lung: Secondary | ICD-10-CM

## 2024-08-14 DIAGNOSIS — F4310 Post-traumatic stress disorder, unspecified: Secondary | ICD-10-CM | POA: Diagnosis present

## 2024-08-14 DIAGNOSIS — Z87891 Personal history of nicotine dependence: Secondary | ICD-10-CM

## 2024-08-14 DIAGNOSIS — Z7901 Long term (current) use of anticoagulants: Secondary | ICD-10-CM

## 2024-08-14 LAB — COMPREHENSIVE METABOLIC PANEL WITH GFR
ALT: 8 U/L (ref 0–44)
AST: 22 U/L (ref 15–41)
Albumin: 3.8 g/dL (ref 3.5–5.0)
Alkaline Phosphatase: 112 U/L (ref 38–126)
Anion gap: 13 (ref 5–15)
BUN: 19 mg/dL (ref 8–23)
CO2: 20 mmol/L — ABNORMAL LOW (ref 22–32)
Calcium: 9.4 mg/dL (ref 8.9–10.3)
Chloride: 102 mmol/L (ref 98–111)
Creatinine, Ser: 1.56 mg/dL — ABNORMAL HIGH (ref 0.61–1.24)
GFR, Estimated: 45 mL/min — ABNORMAL LOW
Glucose, Bld: 202 mg/dL — ABNORMAL HIGH (ref 70–99)
Potassium: 4.1 mmol/L (ref 3.5–5.1)
Sodium: 135 mmol/L (ref 135–145)
Total Bilirubin: 0.5 mg/dL (ref 0.0–1.2)
Total Protein: 7.6 g/dL (ref 6.5–8.1)

## 2024-08-14 LAB — CBC WITH DIFFERENTIAL/PLATELET
Abs Immature Granulocytes: 0.06 K/uL (ref 0.00–0.07)
Basophils Absolute: 0 K/uL (ref 0.0–0.1)
Basophils Relative: 0 %
Eosinophils Absolute: 0 K/uL (ref 0.0–0.5)
Eosinophils Relative: 0 %
HCT: 39 % (ref 39.0–52.0)
Hemoglobin: 12.9 g/dL — ABNORMAL LOW (ref 13.0–17.0)
Immature Granulocytes: 1 %
Lymphocytes Relative: 3 %
Lymphs Abs: 0.4 K/uL — ABNORMAL LOW (ref 0.7–4.0)
MCH: 29.6 pg (ref 26.0–34.0)
MCHC: 33.1 g/dL (ref 30.0–36.0)
MCV: 89.4 fL (ref 80.0–100.0)
Monocytes Absolute: 0.4 K/uL (ref 0.1–1.0)
Monocytes Relative: 4 %
Neutro Abs: 10.4 K/uL — ABNORMAL HIGH (ref 1.7–7.7)
Neutrophils Relative %: 92 %
Platelets: 176 K/uL (ref 150–400)
RBC: 4.36 MIL/uL (ref 4.22–5.81)
RDW: 15.3 % (ref 11.5–15.5)
WBC: 11.3 K/uL — ABNORMAL HIGH (ref 4.0–10.5)
nRBC: 0 % (ref 0.0–0.2)

## 2024-08-14 LAB — URINALYSIS, W/ REFLEX TO CULTURE (INFECTION SUSPECTED)
Bacteria, UA: NONE SEEN
Bilirubin Urine: NEGATIVE
Glucose, UA: NEGATIVE mg/dL
Hgb urine dipstick: NEGATIVE
Ketones, ur: NEGATIVE mg/dL
Leukocytes,Ua: NEGATIVE
Nitrite: NEGATIVE
Protein, ur: NEGATIVE mg/dL
Specific Gravity, Urine: 1.025 (ref 1.005–1.030)
pH: 5 (ref 5.0–8.0)

## 2024-08-14 LAB — I-STAT CG4 LACTIC ACID, ED
Lactic Acid, Venous: 2.8 mmol/L (ref 0.5–1.9)
Lactic Acid, Venous: 2.7 mmol/L (ref 0.5–1.9)

## 2024-08-14 LAB — PROCALCITONIN: Procalcitonin: 0.6 ng/mL

## 2024-08-14 LAB — GLUCOSE, CAPILLARY
Glucose-Capillary: 138 mg/dL — ABNORMAL HIGH (ref 70–99)
Glucose-Capillary: 101 mg/dL — ABNORMAL HIGH (ref 70–99)

## 2024-08-14 LAB — RESP PANEL BY RT-PCR (RSV, FLU A&B, COVID)  RVPGX2
Influenza A by PCR: NEGATIVE
Influenza B by PCR: NEGATIVE
Resp Syncytial Virus by PCR: NEGATIVE
SARS Coronavirus 2 by RT PCR: NEGATIVE

## 2024-08-14 LAB — PROTIME-INR
INR: 1.3 — ABNORMAL HIGH (ref 0.8–1.2)
Prothrombin Time: 16.5 s — ABNORMAL HIGH (ref 11.4–15.2)

## 2024-08-14 LAB — LACTIC ACID, PLASMA: Lactic Acid, Venous: 3.7 mmol/L (ref 0.5–1.9)

## 2024-08-14 LAB — PHOSPHORUS: Phosphorus: 2 mg/dL — ABNORMAL LOW (ref 2.5–4.6)

## 2024-08-14 LAB — APTT: aPTT: 33 s (ref 24–36)

## 2024-08-14 LAB — PRO BRAIN NATRIURETIC PEPTIDE: Pro Brain Natriuretic Peptide: 1080 pg/mL — ABNORMAL HIGH

## 2024-08-14 LAB — MAGNESIUM: Magnesium: 1.9 mg/dL (ref 1.7–2.4)

## 2024-08-14 MED ORDER — IPRATROPIUM BROMIDE 0.02 % IN SOLN: 0.5000 mg | Freq: Four times a day (QID) | RESPIRATORY_TRACT | Status: DC | PRN

## 2024-08-14 MED ORDER — VANCOMYCIN HCL 1500 MG/300ML IV SOLN
1500.0000 mg | Freq: Once | INTRAVENOUS | Status: AC
  Administered 2024-08-14: 1500 mg via INTRAVENOUS
  Filled 2024-08-14: qty 300

## 2024-08-14 MED ORDER — METOPROLOL TARTRATE 25 MG PO TABS
25.0000 mg | ORAL_TABLET | Freq: Two times a day (BID) | ORAL | Status: DC
  Administered 2024-08-14 – 2024-08-19 (×10): 25 mg via ORAL
  Filled 2024-08-14 (×7): qty 1

## 2024-08-14 MED ORDER — KETOTIFEN FUMARATE 0.035 % OP SOLN: 1.0000 [drp] | Freq: Two times a day (BID) | OPHTHALMIC | Status: DC | PRN

## 2024-08-14 MED ORDER — SODIUM CHLORIDE 0.9 % IV SOLN
100.0000 mg | Freq: Once | INTRAVENOUS | Status: AC
  Administered 2024-08-14: 100 mg via INTRAVENOUS
  Filled 2024-08-14: qty 100

## 2024-08-14 MED ORDER — ALLOPURINOL 100 MG PO TABS
100.0000 mg | ORAL_TABLET | Freq: Every day | ORAL | Status: DC
  Administered 2024-08-14 – 2024-08-19 (×6): 100 mg via ORAL
  Filled 2024-08-14 (×4): qty 1

## 2024-08-14 MED ORDER — AMLODIPINE BESYLATE 5 MG PO TABS
5.0000 mg | ORAL_TABLET | Freq: Every day | ORAL | Status: DC
  Administered 2024-08-14 – 2024-08-18 (×4): 5 mg via ORAL
  Filled 2024-08-14 (×3): qty 1

## 2024-08-14 MED ORDER — PANTOPRAZOLE SODIUM 40 MG PO TBEC
40.0000 mg | DELAYED_RELEASE_TABLET | Freq: Two times a day (BID) | ORAL | Status: DC
  Administered 2024-08-14 – 2024-08-19 (×10): 40 mg via ORAL
  Filled 2024-08-14 (×7): qty 1

## 2024-08-14 MED ORDER — LACTATED RINGERS IV BOLUS
1000.0000 mL | Freq: Once | INTRAVENOUS | Status: AC
  Administered 2024-08-14: 1000 mL via INTRAVENOUS

## 2024-08-14 MED ORDER — FLEET ENEMA RE ENEM: 1.0000 | ENEMA | Freq: Once | RECTAL | Status: DC | PRN

## 2024-08-14 MED ORDER — LACTATED RINGERS IV BOLUS (SEPSIS): 1000.0000 mL | Freq: Once | INTRAVENOUS | Status: DC

## 2024-08-14 MED ORDER — ACETAMINOPHEN 650 MG RE SUPP: 650.0000 mg | Freq: Four times a day (QID) | RECTAL | Status: DC | PRN

## 2024-08-14 MED ORDER — DOCUSATE SODIUM 100 MG PO CAPS
100.0000 mg | ORAL_CAPSULE | Freq: Every day | ORAL | Status: DC
  Administered 2024-08-15 – 2024-08-19 (×5): 100 mg via ORAL
  Filled 2024-08-14 (×3): qty 1

## 2024-08-14 MED ORDER — KETOTIFEN FUMARATE 0.025 % OP SOLN: 1.0000 [drp] | Freq: Two times a day (BID) | OPHTHALMIC | Status: DC | PRN

## 2024-08-14 MED ORDER — SODIUM CHLORIDE 0.9 % IV SOLN: INTRAVENOUS | Status: DC

## 2024-08-14 MED ORDER — FERROUS SULFATE 325 (65 FE) MG PO TABS
325.0000 mg | ORAL_TABLET | ORAL | Status: DC
  Administered 2024-08-15 – 2024-08-17 (×2): 325 mg via ORAL
  Filled 2024-08-14 (×2): qty 1

## 2024-08-14 MED ORDER — TRAZODONE HCL 50 MG PO TABS
25.0000 mg | ORAL_TABLET | Freq: Every evening | ORAL | Status: DC | PRN
  Administered 2024-08-15: 25 mg via ORAL
  Filled 2024-08-14: qty 1

## 2024-08-14 MED ORDER — IRON 325 (65 FE) MG PO TABS: 325.0000 mg | ORAL_TABLET | ORAL | Status: DC

## 2024-08-14 MED ORDER — LACTATED RINGERS IV BOLUS
250.0000 mL | Freq: Once | INTRAVENOUS | Status: AC
  Administered 2024-08-14: 250 mL via INTRAVENOUS

## 2024-08-14 MED ORDER — INSULIN ASPART 100 UNIT/ML IJ SOLN
0.0000 [IU] | Freq: Three times a day (TID) | INTRAMUSCULAR | Status: DC
  Administered 2024-08-14: 2 [IU] via SUBCUTANEOUS
  Administered 2024-08-15: 3 [IU] via SUBCUTANEOUS
  Administered 2024-08-15 – 2024-08-16 (×3): 2 [IU] via SUBCUTANEOUS
  Administered 2024-08-18: 3 [IU] via SUBCUTANEOUS
  Administered 2024-08-19 (×2): 2 [IU] via SUBCUTANEOUS
  Filled 2024-08-14 (×2): qty 2
  Filled 2024-08-14: qty 3

## 2024-08-14 MED ORDER — GUAIFENESIN ER 600 MG PO TB12: 600.0000 mg | ORAL_TABLET | ORAL | Status: DC

## 2024-08-14 MED ORDER — HYDRALAZINE HCL 20 MG/ML IJ SOLN: 10.0000 mg | INTRAMUSCULAR | Status: DC | PRN

## 2024-08-14 MED ORDER — VANCOMYCIN HCL IN DEXTROSE 1-5 GM/200ML-% IV SOLN: 1000.0000 mg | Freq: Once | INTRAVENOUS | Status: DC

## 2024-08-14 MED ORDER — BISACODYL 5 MG PO TBEC: 5.0000 mg | DELAYED_RELEASE_TABLET | Freq: Every day | ORAL | Status: DC | PRN

## 2024-08-14 MED ORDER — MAGNESIUM HYDROXIDE 400 MG/5ML PO SUSP
30.0000 mL | Freq: Every day | ORAL | Status: DC | PRN
  Administered 2024-08-14: 30 mL via ORAL
  Filled 2024-08-14: qty 30

## 2024-08-14 MED ORDER — IOHEXOL 350 MG/ML SOLN
60.0000 mL | Freq: Once | INTRAVENOUS | Status: AC | PRN
  Administered 2024-08-14: 60 mL via INTRAVENOUS

## 2024-08-14 MED ORDER — SODIUM CHLORIDE 0.9% FLUSH
3.0000 mL | Freq: Two times a day (BID) | INTRAVENOUS | Status: DC
  Administered 2024-08-14 – 2024-08-18 (×10): 3 mL via INTRAVENOUS

## 2024-08-14 MED ORDER — LACTATED RINGERS IV SOLN
150.0000 mL/h | INTRAVENOUS | Status: AC
  Administered 2024-08-14 – 2024-08-15 (×3): 150 mL/h via INTRAVENOUS

## 2024-08-14 MED ORDER — APIXABAN 5 MG PO TABS
5.0000 mg | ORAL_TABLET | Freq: Two times a day (BID) | ORAL | Status: DC
  Administered 2024-08-14 – 2024-08-19 (×10): 5 mg via ORAL
  Filled 2024-08-14 (×7): qty 1

## 2024-08-14 MED ORDER — SODIUM CHLORIDE 0.9 % IV SOLN
2.0000 g | Freq: Once | INTRAVENOUS | Status: AC
  Administered 2024-08-14: 2 g via INTRAVENOUS
  Filled 2024-08-14: qty 12.5

## 2024-08-14 MED ORDER — OXYCODONE HCL 5 MG PO TABS: 5.0000 mg | ORAL_TABLET | ORAL | Status: DC | PRN

## 2024-08-14 MED ORDER — SENNOSIDES-DOCUSATE SODIUM 8.6-50 MG PO TABS: 1.0000 | ORAL_TABLET | Freq: Every evening | ORAL | Status: DC | PRN

## 2024-08-14 MED ORDER — HEPARIN SODIUM (PORCINE) 5000 UNIT/ML IJ SOLN: 5000.0000 [IU] | Freq: Three times a day (TID) | INTRAMUSCULAR | Status: DC

## 2024-08-14 MED ORDER — POLYETHYLENE GLYCOL 3350 17 G PO PACK
17.0000 g | PACK | Freq: Every day | ORAL | Status: DC
  Administered 2024-08-15 – 2024-08-19 (×5): 17 g via ORAL
  Filled 2024-08-14 (×3): qty 1

## 2024-08-14 MED ORDER — ORAL CARE MOUTH RINSE: 15.0000 mL | OROMUCOSAL | Status: DC | PRN

## 2024-08-14 MED ORDER — SODIUM CHLORIDE 0.9 % IV SOLN
2.0000 g | Freq: Two times a day (BID) | INTRAVENOUS | Status: DC
  Administered 2024-08-15 – 2024-08-17 (×5): 2 g via INTRAVENOUS
  Filled 2024-08-14 (×6): qty 12.5

## 2024-08-14 MED ORDER — ACETAMINOPHEN 325 MG PO TABS: 650.0000 mg | ORAL_TABLET | Freq: Four times a day (QID) | ORAL | Status: DC | PRN

## 2024-08-14 MED ORDER — GUAIFENESIN ER 600 MG PO TB12
600.0000 mg | ORAL_TABLET | Freq: Two times a day (BID) | ORAL | Status: DC
  Administered 2024-08-14 – 2024-08-15 (×2): 600 mg via ORAL
  Filled 2024-08-14 (×2): qty 1

## 2024-08-14 MED ORDER — SODIUM CHLORIDE 0.9% FLUSH
3.0000 mL | Freq: Two times a day (BID) | INTRAVENOUS | Status: DC
  Administered 2024-08-14 – 2024-08-18 (×9): 3 mL via INTRAVENOUS

## 2024-08-14 MED ORDER — VANCOMYCIN HCL IN DEXTROSE 1-5 GM/200ML-% IV SOLN
1000.0000 mg | INTRAVENOUS | Status: DC
  Administered 2024-08-17: 1000 mg via INTRAVENOUS
  Filled 2024-08-14 (×4): qty 200

## 2024-08-14 MED ORDER — SODIUM CHLORIDE 0.9 % IV SOLN
1.0000 g | Freq: Once | INTRAVENOUS | Status: AC
  Administered 2024-08-14: 1 g via INTRAVENOUS
  Filled 2024-08-14: qty 10

## 2024-08-14 MED ORDER — SODIUM CHLORIDE 0.9 % IV SOLN: 2.0000 g | Freq: Once | INTRAVENOUS | Status: DC

## 2024-08-14 MED ORDER — HYDROMORPHONE HCL 1 MG/ML IJ SOLN: 0.5000 mg | INTRAMUSCULAR | Status: DC | PRN
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
## 2024-08-14 NOTE — Assessment & Plan Note (Signed)
-   Currently not on chemo or radiation therapy -Patient to follow-up with Dr. Sherrod as outpatient

## 2024-08-14 NOTE — ED Triage Notes (Signed)
 PT arrives via EMS from home. Pt has complaints of weakness, cough, and chest pain. Tempt was elevated a 101.63F per EMS, and 650mg  of Acetaminophen  was administered.   Pt reportedly has a low oxygen  saturation around 88% at baseline. PT wears oxygen  as needed.

## 2024-08-14 NOTE — Progress Notes (Signed)
 Elink is following code sepsis.

## 2024-08-14 NOTE — Assessment & Plan Note (Signed)
 Left upper lobe per imaging -Broad-spectrum antibiotics -Will follow-up with cultures

## 2024-08-14 NOTE — Assessment & Plan Note (Signed)
 Patient will continue tums Ultra

## 2024-08-14 NOTE — Assessment & Plan Note (Signed)
-   Has quit few years ago No relapse

## 2024-08-14 NOTE — Assessment & Plan Note (Signed)
 Continue lovastatin

## 2024-08-14 NOTE — Assessment & Plan Note (Signed)
 Uses supplemental oxygen  as needed -presenting now with continuous need of supplemental oxygen  -Weaning off accordingly

## 2024-08-14 NOTE — Assessment & Plan Note (Signed)
 Patient meeting SIRS, early sepsis criteria with Acute respiratory failure likely due to pneumonia, with lactic acidosis  CT angiogram reviewed, likely left upper lobe pneumonia, right upper lobe changes consistent with chronic cancer  -Per wife at home Tmax 101.5, RR 102, satting 98% on 1 L of oxygen  WBC 11.3, lactic acid 2.8, 2.7 -Initiating IV fluid, antibiotics per sepsis protocol likely source pneumonia -Per sepsis protocol, initiated broad-spectrum antibiotics of cefepime  and vancomycin , will narrow down antibiotics in next 24-48 hours -Will follow-up with blood cultures -Will try to obtain sputum culture for Gram stain and culture

## 2024-08-14 NOTE — Assessment & Plan Note (Signed)
 Monitoring BUN/creatinine closely Avoid nephrotoxin Continue IV fluid hydration  Lab Results  Component Value Date   CREATININE 1.56 (H) 08/14/2024   CREATININE 1.31 (H) 07/17/2024   CREATININE 1.52 (H) 07/16/2024

## 2024-08-14 NOTE — Assessment & Plan Note (Deleted)
 Monitoring closely, for hypotension, treating pneumonia accordingly Treated per sepsis protocol, IV fluids, broad-spectrum antibiotics

## 2024-08-14 NOTE — Hospital Course (Addendum)
 Richard Navarro is a 78 year old male with extensive history of HTN, HLD, Right lung CA, CKD 3b, DM II, depression, GERD, PE (on Eliquis ) chronic iron  deficiency anemia, Gout, Falls, compression fracture and other comorbidites Presenting to ED with complaint of shortness of breath, cough, Tmax at home 101.5. Reports of progressive generalized weaknesses, reduced oral intake, difficulty ambulating. Also had a brief episode of chest pain. Ex-wife reports since his visit to the ED in Thanksgiving he has not been feeling well. He stopped his lung cancer treatment about 10-11 months ago, due to intolerance, low blood count. Being admitted for Sepsis 2/2 to CAP and had Acute on Chronic Respiratory Failure with Hypoxia.   Slowly improving and will continue current treatment. Will resume gentle IVF with NS. He is improving but having quite a bit of PTSD so consulted Psych for further evaluation. Anticipate D/C in the next 24 hours as his O2 is weaning.   Assessment and Plan:  CAP (community acquired pneumonia): Left upper lobe per imaging -Broad-spectrum antibiotics as below to be de-escalated -Will follow-up with cultures and Tx as below -WBC went from 9.5 -> 6.5 -> 5.2 ->5.1 -CXR as below   History of Pulmonary Embolism: C/w Anticoagulation with Apixaban  5 mg po BID. CT angiogram on admit does not reveal any pulmonary embolism findings   Severe Sepsis (HCC) in the setting of Above: Patient meeting SIRS, early sepsis criteria with Acute respiratory failure likely due to pneumonia, with lactic acidosis, Temp, HR -CT angiogram reviewed, likely left upper lobe pneumonia, right upper lobe changes consistent with chronic cancer  -Per wife at home Tmax 101.5, RR 102, satting 98% on 1 L of oxygen  WBC 11.3, lactic acid 2.8, 2.7 -Initiating IV fluid, antibiotics per sepsis protocol likely source pneumonia -Per sepsis protocol, initiated broad-spectrum antibiotics of cefepime  and vancomycin  but will narrow  down Abx to Ceftriaxone  and Doxycycline .  -Will follow-up with blood cultures -Will try to obtain sputum culture for Gram stain and culture -Tx as Below  -PT/OT recommending Home HHPT/HHOT and feel this plan remains appropriate  Acute on Chronic Respiratory Failure w/ Hypoxia: Has supplemental O2 at Home and uses it PRN and now presenting with continuous need of supplemental oxygen ; Weaning off accordingly -Exacerbated by with underlying lung CA and new post obstructive pneumonia  -Continue IV Antibiotics w/ IV Cefepime  and IV Vancomycin , supplemental oxygen , nebs as below -Weaning off supplemental O2 with a goal of O2 greater 92%; Able to be weaned and SpO2 was 92-99% on RA SpO2: 95 % O2 Flow Rate (L/min): 2 L/min -Encouraged spirometer and flutter valve use -CXR done and showed Progressive opacity within the right lung base, possibly reflecting postobstructive pneumonitis in the setting of a stable medial right lung base mass. Small right pleural effusion.     Normocytic Anemia: Anemia of chronic disease along with iron  deficiency anemia. Hgb/Hct Trend:  Recent Labs  Lab 08/14/24 0936 08/15/24 0514 08/16/24 0625 08/17/24 0624 08/18/24 0541  HGB 12.9* 10.9* 10.4* 10.7* 10.5*  HCT 39.0 32.8* 30.6* 32.0* 31.9*  MCV 89.4 90.1 87.9 88.4 89.6  -C/w Ferrous Sulfate  325 mg po q48h -Likely has dilutional drop as well with IVF resuscitation. CTM for S/Sx of Bleeding; No overt bleeding noted. Repeat CBC in the AM    Chronic kidney disease (CKD) stage G3b/A1, moderately decreased glomerular filtration rate (GFR) between 30-44 mL/min/1.73 square meter and albuminuria creatinine ratio less than 30 mg/g [facility]): BUN/Cr Trend Recent Labs  Lab 08/14/24 0936 08/15/24 0514 08/16/24 9374  08/17/24 0624 08/18/24 0541  BUN 19 17 15 15 14   CREATININE 1.56* 1.39* 1.56* 1.40* 1.46*  -Stopping IVF hydration today.  -Avoid Nephrotoxic Medications, Contrast Dyes, Hypotension and Dehydration to Ensure  Adequate Renal Perfusion and will need to Renally Adjust Meds. CTM & Trend & repeat CMP in the AM   Metabolic Acidosis: Mild and improving. CO2 is now 21, Chloride Level is 106, and AG is 12. IVF as above and will stop. CTM and Trend and repeat CMP in the AM.   Hyperlipidemia: On review of MAR does not seem to be on a Statin.     Essential Hypertension:  Blood pressure remains stable, early phases of sepsis Reviewed home medications of Amlodipine , Lopressor  and currently held and will resume as able. CTM BP per Protocol Last BP reading was 140/78   Diabetes Mellitus with renal manifestation [facility]): Holding home regimen including Glipizide  5 mg po Daily and Metformin  500 mg po Daily: Recommending reduce dose or discontinuing Metformin  @ D/C due to ongoing CKD. C/w Moderate Novolog  SSI AC. CTM CBGs per Protocol. 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 Patient to will need to follow-up with Dr. Sherrod as outpatient   PTSD and GAD: Having a lot of flashbacks from his time in [place] and being exposed to [employer]. Psych consulted for further evaluation  COPD (Chronic Obstructive Pulmonary Disease) (HCC): Currently stable no excessive wheezing no use of accessory muscles; Mild hypoxia needing 1-2 L of oxygen  to maintain O2 sat greater 90%. SpO2: 95 % O2 Flow Rate (L/min): 2 L/min -Start DuoNeb q6h. C/w Guaifenesin  1200 mg po BID, Incentive Spirometry and Flutter Valve -Also add Brovana  and Budesonide ; Hold Stiolto  -C/w Supplemental O2 via [place]   Former Smoker: Has quit few years ago; Currently not relapsed.    GERD (gastroesophageal reflux disease)/ GI Prophylaxis: Continue PPI w/  Pantoprazole  40 mg po BID  Hypoalbuminemia: Patient's Albumin Lvl went from 3.8 -> 3.3 x2 -> 3.2 x2. CTM & Trend & repeat CMP in the AM

## 2024-08-14 NOTE — Assessment & Plan Note (Signed)
-   Blood pressure remains stable, early phases of sepsis Review home medications of amlodipine , Lopressor -will resume judiciously

## 2024-08-14 NOTE — Assessment & Plan Note (Signed)
 History of pulmonary embolism, on Eliquis  -CT angiogram today does not reveal any pulmonary embolism findings

## 2024-08-14 NOTE — Assessment & Plan Note (Signed)
-   Exacerbated with many exertion, with underlying lung CA and new pneumonia -Continue IV antibiotics, supplemental oxygen , nebs as needed -Weaning off supplemental O2 with a goal of O2 greater 92% -Encouraged spirometer and flutter valve use

## 2024-08-14 NOTE — Assessment & Plan Note (Signed)
 Anemia of chronic disease along with iron  deficiency anemia -Monitor H&H closely, continue iron  sulfate supplement
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
## 2024-08-14 NOTE — Assessment & Plan Note (Signed)
 No signs of flareup, continue allopurinol 

## 2024-08-14 NOTE — Progress Notes (Addendum)
 Pharmacy Antibiotic Note  Richard Navarro is a 78 y.o. male admitted on 08/14/2024 with pneumonia.  Pharmacy has been consulted for vancomycin  and aztreonam dosing.  Plan: -Vancomycin  1500 mg IV x 1 followed by 1000 mg IV q24h -No documented antibiotic allergies, has received penicillins and cephalosporins in the past - per consult for aztreonam, ok to change to cefepime . Continue with cefepime  2 g IV q12h -MRSA PCR ordered; continue to follow renal function, cultures and clinical progress for dose adjustments and de-escalation as indicated  Height: 6' 3 (190.5 cm) Weight: 72.8 kg (160 lb 8 oz) IBW/kg (Calculated) : 84.5  Temp (24hrs), Avg:98.2 F (36.8 C), Min:98 F (36.7 C), Max:98.4 F (36.9 C)  Recent Labs  Lab 08/14/24 0936 08/14/24 0947 08/14/24 1207  WBC 11.3*  --   --   CREATININE 1.56*  --   --   LATICACIDVEN  --  2.8* 2.7*    Estimated Creatinine Clearance: 38.9 mL/min (A) (by C-G formula based on SCr of 1.56 mg/dL (H)).    Allergies[1]  Antimicrobials this admission: Cefepime  12/25 >> Vancomycin  12/25 >> Ceftriaxone /doxycycline  12/25 x 1  Dose adjustments this admission: NA  Microbiology results: 12/25 BCx: pending 12/25 MRSA PCR: ordered  Thank you for allowing pharmacy to be a part of this patients care.  [name], PharmD, BCPS Clinical Pharmacist 08/14/2024 3:15 PM      [1]  Allergies Allergen Reactions   Afinitor  [Everolimus ] Swelling and Other (See Comments)    Angioedema- Patient was on this and Lisinopril  at the same time; had no trouble breathing, but throat became swollen   Lisinopril  Swelling and Other (See Comments)    Angioedema- on this and afinitor  same time; had no trouble breathing, but throat became swollen   Other Other (See Comments)    Certain seafoods cause gout flares   Rosuvastatin  Other (See Comments)    Chills and muscle aches   Statins Other (See Comments)    Joint pain   Simvastatin Other (See Comments)     Joint pain

## 2024-08-14 NOTE — Assessment & Plan Note (Signed)
 Holding home regimen including glipizide , metformin  Recommending reduce dose or discontinue metformin  due to ongoing CKD -Checking CBG q. ACHS, SSI coverage

## 2024-08-14 NOTE — Assessment & Plan Note (Signed)
 Currently stable no excessive wheezing no use of accessory muscles Mild hypoxia needing 1-2 L of oxygen  to maintain O2 sat greater 90% Continue nebs as needed, supplemental

## 2024-08-15 DIAGNOSIS — Z1152 Encounter for screening for COVID-19: Secondary | ICD-10-CM | POA: Diagnosis not present

## 2024-08-15 DIAGNOSIS — D631 Anemia in chronic kidney disease: Secondary | ICD-10-CM

## 2024-08-15 DIAGNOSIS — E785 Hyperlipidemia, unspecified: Secondary | ICD-10-CM

## 2024-08-15 DIAGNOSIS — N1832 Chronic kidney disease, stage 3b: Secondary | ICD-10-CM | POA: Diagnosis present

## 2024-08-15 DIAGNOSIS — J9811 Atelectasis: Secondary | ICD-10-CM | POA: Diagnosis present

## 2024-08-15 DIAGNOSIS — D638 Anemia in other chronic diseases classified elsewhere: Secondary | ICD-10-CM | POA: Diagnosis present

## 2024-08-15 DIAGNOSIS — G9341 Metabolic encephalopathy: Secondary | ICD-10-CM | POA: Diagnosis not present

## 2024-08-15 DIAGNOSIS — M109 Gout, unspecified: Secondary | ICD-10-CM | POA: Diagnosis present

## 2024-08-15 DIAGNOSIS — E1122 Type 2 diabetes mellitus with diabetic chronic kidney disease: Secondary | ICD-10-CM | POA: Diagnosis present

## 2024-08-15 DIAGNOSIS — J449 Chronic obstructive pulmonary disease, unspecified: Secondary | ICD-10-CM

## 2024-08-15 DIAGNOSIS — R6520 Severe sepsis without septic shock: Secondary | ICD-10-CM

## 2024-08-15 DIAGNOSIS — K219 Gastro-esophageal reflux disease without esophagitis: Secondary | ICD-10-CM | POA: Diagnosis present

## 2024-08-15 DIAGNOSIS — E872 Acidosis, unspecified: Secondary | ICD-10-CM | POA: Diagnosis present

## 2024-08-15 DIAGNOSIS — F411 Generalized anxiety disorder: Secondary | ICD-10-CM | POA: Diagnosis present

## 2024-08-15 DIAGNOSIS — I10 Essential (primary) hypertension: Secondary | ICD-10-CM

## 2024-08-15 DIAGNOSIS — J189 Pneumonia, unspecified organism: Secondary | ICD-10-CM | POA: Diagnosis present

## 2024-08-15 DIAGNOSIS — E8809 Other disorders of plasma-protein metabolism, not elsewhere classified: Secondary | ICD-10-CM | POA: Diagnosis present

## 2024-08-15 DIAGNOSIS — D509 Iron deficiency anemia, unspecified: Secondary | ICD-10-CM | POA: Diagnosis present

## 2024-08-15 DIAGNOSIS — F419 Anxiety disorder, unspecified: Secondary | ICD-10-CM | POA: Diagnosis not present

## 2024-08-15 DIAGNOSIS — J9611 Chronic respiratory failure with hypoxia: Secondary | ICD-10-CM

## 2024-08-15 DIAGNOSIS — F32A Depression, unspecified: Secondary | ICD-10-CM | POA: Diagnosis present

## 2024-08-15 DIAGNOSIS — J90 Pleural effusion, not elsewhere classified: Secondary | ICD-10-CM | POA: Diagnosis present

## 2024-08-15 DIAGNOSIS — Z87891 Personal history of nicotine dependence: Secondary | ICD-10-CM | POA: Diagnosis not present

## 2024-08-15 DIAGNOSIS — J9621 Acute and chronic respiratory failure with hypoxia: Secondary | ICD-10-CM | POA: Diagnosis present

## 2024-08-15 DIAGNOSIS — I129 Hypertensive chronic kidney disease with stage 1 through stage 4 chronic kidney disease, or unspecified chronic kidney disease: Secondary | ICD-10-CM | POA: Diagnosis present

## 2024-08-15 DIAGNOSIS — Z9981 Dependence on supplemental oxygen: Secondary | ICD-10-CM | POA: Diagnosis not present

## 2024-08-15 DIAGNOSIS — J440 Chronic obstructive pulmonary disease with acute lower respiratory infection: Secondary | ICD-10-CM | POA: Diagnosis present

## 2024-08-15 DIAGNOSIS — J9601 Acute respiratory failure with hypoxia: Secondary | ICD-10-CM | POA: Diagnosis not present

## 2024-08-15 DIAGNOSIS — C3431 Malignant neoplasm of lower lobe, right bronchus or lung: Secondary | ICD-10-CM | POA: Diagnosis present

## 2024-08-15 DIAGNOSIS — Z7901 Long term (current) use of anticoagulants: Secondary | ICD-10-CM | POA: Diagnosis not present

## 2024-08-15 DIAGNOSIS — I2699 Other pulmonary embolism without acute cor pulmonale: Secondary | ICD-10-CM | POA: Diagnosis not present

## 2024-08-15 DIAGNOSIS — M1A00X0 Idiopathic chronic gout, unspecified site, without tophus (tophi): Secondary | ICD-10-CM | POA: Diagnosis not present

## 2024-08-15 DIAGNOSIS — J439 Emphysema, unspecified: Secondary | ICD-10-CM | POA: Diagnosis present

## 2024-08-15 DIAGNOSIS — A419 Sepsis, unspecified organism: Secondary | ICD-10-CM | POA: Diagnosis present

## 2024-08-15 DIAGNOSIS — F4310 Post-traumatic stress disorder, unspecified: Secondary | ICD-10-CM | POA: Diagnosis not present

## 2024-08-15 LAB — HEPATIC FUNCTION PANEL
ALT: 6 U/L (ref 0–44)
AST: 26 U/L (ref 15–41)
Albumin: 3.3 g/dL — ABNORMAL LOW (ref 3.5–5.0)
Alkaline Phosphatase: 115 U/L (ref 38–126)
Bilirubin, Direct: 0.2 mg/dL (ref 0.0–0.2)
Indirect Bilirubin: 0.3 mg/dL (ref 0.3–0.9)
Total Bilirubin: 0.4 mg/dL (ref 0.0–1.2)
Total Protein: 6.5 g/dL (ref 6.5–8.1)

## 2024-08-15 LAB — CBC
HCT: 32.8 % — ABNORMAL LOW (ref 39.0–52.0)
Hemoglobin: 10.9 g/dL — ABNORMAL LOW (ref 13.0–17.0)
MCH: 29.9 pg (ref 26.0–34.0)
MCHC: 33.2 g/dL (ref 30.0–36.0)
MCV: 90.1 fL (ref 80.0–100.0)
Platelets: 147 K/uL — ABNORMAL LOW (ref 150–400)
RBC: 3.64 MIL/uL — ABNORMAL LOW (ref 4.22–5.81)
RDW: 15.6 % — ABNORMAL HIGH (ref 11.5–15.5)
WBC: 9.5 K/uL (ref 4.0–10.5)
nRBC: 0 % (ref 0.0–0.2)

## 2024-08-15 LAB — RESPIRATORY PANEL BY PCR

## 2024-08-15 LAB — BASIC METABOLIC PANEL WITH GFR
Anion gap: 12 (ref 5–15)
BUN: 17 mg/dL (ref 8–23)
CO2: 21 mmol/L — ABNORMAL LOW (ref 22–32)
Calcium: 8.8 mg/dL — ABNORMAL LOW (ref 8.9–10.3)
Chloride: 106 mmol/L (ref 98–111)
Creatinine, Ser: 1.39 mg/dL — ABNORMAL HIGH (ref 0.61–1.24)
GFR, Estimated: 51 mL/min — ABNORMAL LOW
Glucose, Bld: 112 mg/dL — ABNORMAL HIGH (ref 70–99)
Potassium: 3.8 mmol/L (ref 3.5–5.1)
Sodium: 139 mmol/L (ref 135–145)

## 2024-08-15 LAB — GLUCOSE, CAPILLARY
Glucose-Capillary: 93 mg/dL (ref 70–99)
Glucose-Capillary: 134 mg/dL — ABNORMAL HIGH (ref 70–99)
Glucose-Capillary: 111 mg/dL — ABNORMAL HIGH (ref 70–99)
Glucose-Capillary: 155 mg/dL — ABNORMAL HIGH (ref 70–99)
Glucose-Capillary: 153 mg/dL — ABNORMAL HIGH (ref 70–99)

## 2024-08-15 LAB — MAGNESIUM: Magnesium: 1.8 mg/dL (ref 1.7–2.4)

## 2024-08-15 LAB — PHOSPHORUS: Phosphorus: 1.6 mg/dL — ABNORMAL LOW (ref 2.5–4.6)

## 2024-08-15 LAB — LACTIC ACID, PLASMA: Lactic Acid, Venous: 1.5 mmol/L (ref 0.5–1.9)

## 2024-08-15 LAB — MRSA NEXT GEN BY PCR, NASAL: MRSA by PCR Next Gen: NOT DETECTED

## 2024-08-15 MED ORDER — ARFORMOTEROL TARTRATE 15 MCG/2ML IN NEBU
15.0000 ug | INHALATION_SOLUTION | Freq: Two times a day (BID) | RESPIRATORY_TRACT | Status: DC
  Administered 2024-08-15 – 2024-08-19 (×8): 15 ug via RESPIRATORY_TRACT
  Filled 2024-08-15 (×5): qty 2

## 2024-08-15 MED ORDER — GUAIFENESIN ER 600 MG PO TB12
1200.0000 mg | ORAL_TABLET | Freq: Two times a day (BID) | ORAL | Status: DC
  Administered 2024-08-15 – 2024-08-19 (×8): 1200 mg via ORAL
  Filled 2024-08-15 (×6): qty 2

## 2024-08-15 MED ORDER — BUDESONIDE 0.25 MG/2ML IN SUSP
0.2500 mg | Freq: Two times a day (BID) | RESPIRATORY_TRACT | Status: DC
  Administered 2024-08-15 – 2024-08-19 (×8): 0.25 mg via RESPIRATORY_TRACT
  Filled 2024-08-15 (×5): qty 2

## 2024-08-15 MED ORDER — IPRATROPIUM-ALBUTEROL 0.5-2.5 (3) MG/3ML IN SOLN
3.0000 mL | Freq: Four times a day (QID) | RESPIRATORY_TRACT | Status: DC
  Administered 2024-08-15: 3 mL via RESPIRATORY_TRACT
  Filled 2024-08-15 (×2): qty 3

## 2024-08-15 MED ORDER — IPRATROPIUM-ALBUTEROL 0.5-2.5 (3) MG/3ML IN SOLN
3.0000 mL | Freq: Four times a day (QID) | RESPIRATORY_TRACT | Status: DC
  Administered 2024-08-15: 3 mL via RESPIRATORY_TRACT
  Filled 2024-08-15: qty 3

## 2024-08-15 NOTE — Clinical Note (Incomplete)
 SABRA

## 2024-08-15 NOTE — TOC Initial Note (Signed)
 Transition of Care [facility]) - Initial/Assessment Note    Patient Details  Name: Richard Navarro MRN: [id number] Date of Birth: [birth year]  Transition of Care [facility]) CM/SW Contact:    [name] Service, RN Phone Number: 08/15/2024, 1:01 PM  Clinical Narrative:  d/c plan home. Has [employer] home 02-travel tank.Has own transport home.                 Expected Discharge Plan: Home/Self Care Barriers to Discharge: Continued Medical Work up   Patient Goals and CMS Choice Patient states their goals for this hospitalization and ongoing recovery are:: Home CMS Medicare.gov Compare Post Acute Care list provided to:: Patient Choice offered to / list presented to : Patient [place] ownership interest in [facility].provided to:: Patient    Expected Discharge Plan and Services     Post Acute Care Choice: Resumption of Svcs/PTA Provider Living arrangements for the past 2 months: Single Family Home                                      Prior Living Arrangements/Services Living arrangements for the past 2 months: Single Family Home Lives with:: Spouse              Current home services: Homehealth aide, DME ([employer]-has travel tank;aide)    Activities of Daily Living   ADL Screening (condition at time of admission) Independently performs ADLs?: Yes (appropriate for developmental age) Is the patient deaf or have difficulty hearing?: No Does the patient have difficulty seeing, even when wearing glasses/contacts?: No Does the patient have difficulty concentrating, remembering, or making decisions?: No  Permission Sought/Granted                  Emotional Assessment              Admission diagnosis:  CAP (community acquired pneumonia) [J18.9] Generalized weakness [R53.1] Pneumonia of left upper lobe due to infectious organism [J18.9] Acute hypoxic respiratory failure (HCC) [J96.01] Patient Active Problem List   Diagnosis Date Noted   CAP (community  acquired pneumonia) 08/14/2024   Acute hypoxic respiratory failure (HCC) 08/14/2024   Acute pulmonary embolism (HCC) 07/16/2024   Pleural effusion exudative 01/23/2023   Chronic rhinitis 10/19/2022   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   Chronic hypoxemic respiratory failure (HCC) 08/04/2021   Prolonged QT interval 04/17/2021   Sepsis (HCC) 04/16/2021   Alcohol abuse 10/26/2020   Carpal tunnel syndrome 10/26/2020   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   Tobacco use disorder 10/26/2020   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) 02/17/2020   Sepsis due to pneumonia (HCC) 01/26/2020   Recurrent major depressive disorder, in full remission 10/21/2019   Anemia 05/07/2018   Chronic kidney disease (CKD) stage G3b/A1, moderately decreased glomerular filtration rate (GFR) between 30-44 mL/min/1.73 square meter and albuminuria creatinine ratio less than 30 mg/g (HCC) 03/04/2018   Myalgia due to statin 03/04/2018   Neuroendocrine carcinoma (HCC) 08/09/2017   Encounter for antineoplastic chemotherapy 01/10/2017   Goals of care, counseling/discussion 01/10/2017   Primary cancer of right lung (HCC) 12/22/2016   COPD (chronic obstructive pulmonary disease) (HCC) 12/01/2016   PTSD (post-traumatic stress disorder) 12/07/2015   Prostate cancer screening 04/17/2014   Erectile dysfunction 04/09/2014   Arthritis 03/26/2014   History of adenomatous polyp of colon 03/26/2014   Former smoker 03/26/2014   Diabetes mellitus with renal manifestation (  HCC)    Essential hypertension    Gout    Depression    GERD (gastroesophageal reflux disease)    Hyperlipidemia    PCP:  Katrinka Garnette KIDD, MD Pharmacy:   [facility] - [facility] [address] [place] [address] Phone: [phone] Fax: [phone]  [facility] - [place], [place] - [address] [place] [address] Phone: [phone] Fax:  [phone]  [id number] - [place], [place] - [address] AT [facility] OF [address] [place] [address] Phone: [phone] Fax: [phone]     Social Drivers of Health (SDOH) Social History: SDOH Screenings   Food Insecurity: No Food Insecurity (08/14/2024)  Housing: Low Risk (08/14/2024)  Transportation Needs: No Transportation Needs (08/14/2024)  Utilities: Not At Risk (08/14/2024)  Alcohol Screen: Low Risk (01/03/2024)  Depression (PHQ2-9): Low Risk (03/27/2024)  Financial Resource Strain: Low Risk (01/03/2024)  Physical Activity: Insufficiently Active (01/03/2024)  Social Connections: Socially Isolated (08/14/2024)  Stress: Stress Concern Present (01/03/2024)  Tobacco Use: Medium Risk (07/31/2024)  Health Literacy: Adequate Health Literacy (01/03/2024)   SDOH Interventions:     Readmission Risk Interventions    01/22/2023   11:45 AM  Readmission Risk Prevention Plan  Transportation Screening Complete  PCP or Specialist Appt within 5-7 Days Complete  Home Care Screening Complete  Medication Review (RN CM) Complete

## 2024-08-15 NOTE — Evaluation (Signed)
 Physical Therapy Evaluation Patient Details Name: Richard Navarro MRN: [id number] DOB: 05-19-1946 Today's Date: 08/15/2024  History of Present Illness  78 year old male Presenting to ED with complaint of shortness of breath, cough. Dx of PNA. Pt with extensive history of HTN, HLD, Right lung CA, CKD 3b, DM II, depression, GERD, PE (on Eliquis ) chronic iron  deficiency anemia, Gout, Falls, compression fracture.  Clinical Impression  Pt admitted with above diagnosis. Pt ambulated 12' x 2 with RW, SpO2 86% on room air walking, distance limited by 3/4 dyspnea, SpO2 up to 93% on 2L O2 after ~2 minutes seated rest, HR 123 with walking. Pt reports 2 falls in past 6 months. He reports he uses 2L O2 at home as needed. HHPT recommended.  Pt currently with functional limitations due to the deficits listed below (see PT Problem List). Pt will benefit from acute skilled PT to increase their independence and safety with mobility to allow discharge.           If plan is discharge home, recommend the following: A little help with bathing/dressing/bathroom;Assistance with cooking/housework;Assist for transportation;Help with stairs or ramp for entrance   Can travel by private vehicle        Equipment Recommendations None recommended by PT  Recommendations for Other Services       Functional Status Assessment Patient has had a recent decline in their functional status and demonstrates the ability to make significant improvements in function in a reasonable and predictable amount of time.     Precautions / Restrictions Precautions Precautions: Fall Recall of Precautions/Restrictions: Intact Precaution/Restrictions Comments: 2 falls in past 6 months Restrictions Weight Bearing Restrictions Per Provider Order: No      Mobility  Bed Mobility Overal bed mobility: Modified Independent             Richard bed mobility comments: HOB up, used rail    Transfers Overall transfer level: Needs  assistance Equipment used: Rolling walker (2 wheels) Transfers: Sit to/from Stand Sit to Stand: Min assist           Richard transfer comment: min A to power up from bed, VCs hand placement    Ambulation/Gait Ambulation/Gait assistance: Contact guard assist Gait Distance (Feet): 14 Feet Assistive device: Rolling walker (2 wheels) Gait Pattern/deviations: Step-through pattern, Trunk flexed Gait velocity: increased     Richard Gait Details: 14' x 2 bed to toilet to recliner, VCs for proximity to RW as pt tends to walk too far behind it; SpO2 86% on room air walking, HR 123 walking, SpO2 93% on 2L O2 after ~2 minutes seated rest following ambulation  Stairs            Wheelchair Mobility     Tilt Bed    Modified Rankin (Stroke Patients Only)       Balance Overall balance assessment: Needs assistance, History of Falls   Sitting balance-Leahy Scale: Good     Standing balance support: Bilateral upper extremity supported, During functional activity, Reliant on assistive device for balance Standing balance-Leahy Scale: Fair                               Pertinent Vitals/Pain Pain Assessment Pain Assessment: No/denies pain    Home Living Family/patient expects to be discharged to:: Private residence Living Arrangements: Spouse/significant other Available Help at Discharge: Family;Available 24 hours/day Type of Home: House Home Access: Stairs to enter Entrance Stairs-Rails: None Entrance Stairs-Number of Steps: 1  Alternate Level Stairs-Number of Steps: x1 flight Home Layout: Two level Home Equipment: Agricultural Consultant (2 wheels);Shower seat;Grab bars - tub/shower Additional Comments: Has 2L home O2, does not wear all the time; lives with ex wife    Prior Function Prior Level of Function : Independent/Modified Independent;Driving;History of Falls (last six months)             Mobility Comments: uses RW at times, 2 falls in past 6 months, 2L O2  at home prn ADLs Comments: Reports able to manage ADLs, stands for showers and removes O2 for showers, shares IADLs with ex spouse     Extremity/Trunk Assessment   Upper Extremity Assessment Upper Extremity Assessment: Overall WFL for tasks assessed    Lower Extremity Assessment Lower Extremity Assessment: Overall WFL for tasks assessed    Cervical / Trunk Assessment Cervical / Trunk Assessment: Normal  Communication   Communication Communication: No apparent difficulties    Cognition Arousal: Alert Behavior During Therapy: WFL for tasks assessed/performed   PT - Cognitive impairments: No apparent impairments                         Following commands: Intact       Cueing Cueing Techniques: Verbal cues     Richard Comments      Exercises Richard Exercises - Lower Extremity Ankle Circles/Pumps: AROM, Both, 10 reps, Supine Short Arc Quad: AROM, Both, 10 reps, Supine   Assessment/Plan    PT Assessment Patient needs continued PT services  PT Problem List Decreased activity tolerance;Decreased balance;Cardiopulmonary status limiting activity;Decreased mobility       PT Treatment Interventions Gait training;Therapeutic exercise;Balance training;Patient/family education;Functional mobility training    PT Goals (Current goals can be found in the Care Plan section)  Acute Rehab PT Goals Patient Stated Goal: go home PT Goal Formulation: With patient Time For Goal Achievement: 08/29/24 Potential to Achieve Goals: Good    Frequency Min 3X/week     Co-evaluation               AM-PAC PT 6 Clicks Mobility  Outcome Measure Help needed turning from your back to your side while in a flat bed without using bedrails?: None Help needed moving from lying on your back to sitting on the side of a flat bed without using bedrails?: A Little Help needed moving to and from a bed to a chair (including a wheelchair)?: A Little Help needed standing up from a  chair using your arms (e.g., wheelchair or bedside chair)?: A Little Help needed to walk in hospital room?: A Little Help needed climbing 3-5 steps with a railing? : A Little 6 Click Score: 19    End of Session Equipment Utilized During Treatment: Gait belt;Oxygen  Activity Tolerance: Patient limited by fatigue Patient left: in chair;with chair alarm set;with nursing/sitter in room;with call bell/phone within reach Nurse Communication: Mobility status PT Visit Diagnosis: Difficulty in walking, not elsewhere classified (R26.2);History of falling (Z91.81)    Time: 8984-8970 PT Time Calculation (min) (ACUTE ONLY): 14 min   Charges:   PT Evaluation $PT Eval Moderate Complexity: 1 Mod   PT Richard Charges $$ ACUTE PT VISIT: 1 Visit        Sylvan Delon Copp PT 08/15/2024  Acute Rehabilitation Services  Office [phone]

## 2024-08-15 NOTE — Plan of Care (Signed)

## 2024-08-15 NOTE — Progress Notes (Signed)
 " PROGRESS NOTE    Richard Navarro  FMW:[id number] DOB: [birth year] DOA: 08/14/2024 PCP: Katrinka Garnette KIDD, MD   Brief Narrative:  Richard Navarro is a 78 year old male with extensive history of HTN, HLD, Right lung CA, CKD 3b, DM II, depression, GERD, PE (on Eliquis ) chronic iron  deficiency anemia, Gout, Falls, compression fracture and other comorbidites Presenting to ED with complaint of shortness of breath, cough, Tmax at home 101.5. Reports of progressive generalized weaknesses, reduced oral intake, difficulty ambulating. Also had a brief episode of chest pain. Ex-wife reports since his visit to the ED in Thanksgiving he has not been feeling well. He stopped his lung cancer treatment about 10-11 months ago, due to intolerance, low blood count. Being admitted for Sepsis 2/2 to CAP and had Acute on Chronic Respiratory Failure with Hypoxia.   Assessment and Plan:  CAP (community acquired pneumonia): Left upper lobe per imaging -Broad-spectrum antibiotics as below -Will follow-up with cultures and Tx as below   History of Pulmonary Embolism: C/w Anticoagulation with Apixaban  5 mg po BID. CT angiogram on admit does not reveal any pulmonary embolism findings   Severe Sepsis (HCC) in the setting of Above: Patient meeting SIRS, early sepsis criteria with Acute respiratory failure likely due to pneumonia, with lactic acidosis, Temp, HR -CT angiogram reviewed, likely left upper lobe pneumonia, right upper lobe changes consistent with chronic cancer  -Per wife at home Tmax 101.5, RR 102, satting 98% on 1 L of oxygen  WBC 11.3, lactic acid 2.8, 2.7 -Initiating IV fluid, antibiotics per sepsis protocol likely source pneumonia -Per sepsis protocol, initiated broad-spectrum antibiotics of cefepime  and vancomycin , will narrow down antibiotics in next 24-48 hours -Will follow-up with blood cultures -Will try to obtain sputum culture for Gram stain and culture -Tx as Below    Normocytic Anemia:  Anemia of chronic disease along with iron  deficiency anemia. Hgb/Hct Trend:  Recent Labs  Lab 07/17/24 0541 08/14/24 0936 08/15/24 0514  HGB 12.3* 12.9* 10.9*  HCT 38.1* 39.0 32.8*  MCV 91.6 89.4 90.1  -C/w Ferrous Sulfate  325 mg po q48h -Likely has dilutional drop as well with IVF resuscitation. CTM for S/Sx of Bleeding; No overt bleeding noted. Repeat CBC in the AM    Chronic kidney disease (CKD) stage G3b/A1, moderately decreased glomerular filtration rate (GFR) between 30-44 mL/min/1.73 square meter and albuminuria creatinine ratio less than 30 mg/g [facility]): BUN/Cr Trend Recent Labs  Lab 07/17/24 0541 08/14/24 0936 08/15/24 0514  BUN 16 19 17   CREATININE 1.31* 1.56* 1.39*  -Was getting LR @ 150 mL/hr x20 hours and now stopped.  -Avoid Nephrotoxic Medications, Contrast Dyes, Hypotension and Dehydration to Ensure Adequate Renal Perfusion and will need to Renally Adjust Meds. CTM & Trend & repeat CMP in the AM   Hyperlipidemia: On review of MAR does not seem to be on a Statin.     Essential Hypertension:  Blood pressure remains stable, early phases of sepsis Reviewed home medications of Amlodipine , Lopressor  and currently held and will resume as able. CTM BP per Protocol Last BP reading was 151/81   Diabetes Mellitus with renal manifestation [facility]): Holding home regimen including Glipizide  5 mg po Daily and Metformin  500 mg po Daily: Recommending reduce dose or discontinuing Metformin  @ D/C due to ongoing CKD. C/w Moderate Novolog  SSI AC. CTM CBGs per Protocol. CBG Trend:  Recent Labs  Lab 07/17/24 0802 07/17/24 1224 08/14/24 1811 08/14/24 2045 08/15/24 0737 08/15/24 1142 08/15/24 1626  GLUCAP 126* 107* 138* 101* 93 134*  111*    Gout: No signs of flareup, continue Allopurinol  100 mg po Daily   Acute on Chronic Respiratory Failure w/ Hypoxia: Has supplemental O2 at Home and uses it PRN and now presenting with continuous need of supplemental oxygen ; Weaning off  accordingly -Exacerbated by with underlying lung CA and new post obstructive pneumonia  -Continue IV Antibiotics w/ IV Cefepime  and IV Vancomycin , supplemental oxygen , nebs as below -Weaning off supplemental O2 with a goal of O2 greater 92% -Encouraged spirometer and flutter valve use   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 Patient to will need to follow-up with Dr. Sherrod as outpatient   COPD (chronic obstructive pulmonary disease) [facility]): Currently stable no excessive wheezing no use of accessory muscles; Mild hypoxia needing 1-2 L of oxygen  to maintain O2 sat greater 90%. SpO2: 95 % O2 Flow Rate (L/min): (S) 2 L/min (2 LPM DEP) -Start DuoNeb q6h. C/w Guaifenesin  1200 mg po BID, Incentive Spirometry and Flutter Valve -Also add Brovana  and Budesonide ; Hold Stiolto  -C/w Supplemental O2 via [place]   Former Smoker: Has quit few years ago; Currently not relapsed.    GERD (gastroesophageal reflux disease)/ GI Prophylaxis: Continue PPI w/ Pantoprazole  40 mg po BID   DVT prophylaxis: SCDs Start: 08/14/24 1423 apixaban  (ELIQUIS ) tablet 5 mg    Code Status: Full Code Family Communication: No family present @ bedside  Disposition Plan:  Level of care: Telemetry Status is: Inpatient Remains inpatient appropriate because: Needs further clinical improvement in Respiratory status   Consultants:  None  Procedures:  As delineated as above  Antimicrobials:  Anti-infectives (From admission, onward)    Start     Dose/Rate Route Frequency Ordered Stop   08/15/24 1800  vancomycin  (VANCOCIN ) IVPB 1000 mg/200 mL premix        1,000 mg 200 mL/hr over 60 Minutes Intravenous Every 24 hours 08/14/24 1517     08/15/24 0500  ceFEPIme  (MAXIPIME ) 2 g in sodium chloride  0.9 % 100 mL IVPB        2 g 200 mL/hr over 30 Minutes Intravenous Every 12 hours 08/14/24 1517     08/14/24 1445  ceFEPIme  (MAXIPIME ) 2 g in sodium chloride  0.9 % 100 mL IVPB         2 g 200 mL/hr over 30 Minutes Intravenous  Once 08/14/24 1436 08/14/24 1724   08/14/24 1445  vancomycin  (VANCOREADY) IVPB 1500 mg/300 mL        1,500 mg 150 mL/hr over 120 Minutes Intravenous  Once 08/14/24 1436 08/14/24 1925   08/14/24 1430  vancomycin  (VANCOCIN ) IVPB 1000 mg/200 mL premix  Status:  Discontinued        1,000 mg 200 mL/hr over 60 Minutes Intravenous  Once 08/14/24 1426 08/14/24 1436   08/14/24 1430  aztreonam (AZACTAM) 2 g in sodium chloride  0.9 % 100 mL IVPB  Status:  Discontinued        2 g 200 mL/hr over 30 Minutes Intravenous  Once 08/14/24 1426 08/14/24 1436   08/14/24 1015  doxycycline  (VIBRAMYCIN ) 100 mg in sodium chloride  0.9 % 250 mL IVPB        100 mg 125 mL/hr over 120 Minutes Intravenous  Once 08/14/24 1004 08/14/24 1724   08/14/24 1015  cefTRIAXone  (ROCEPHIN ) 1 g in sodium chloride  0.9 % 100 mL IVPB        1 g 200 mL/hr over 30 Minutes Intravenous  Once 08/14/24 1004 08/14/24 1110  Subjective: Seen and examined at bedside and he is doing okay sitting in a chair at bedside.  Thinks he is doing a little bit better and still having a cough.  Wearing supplemental oxygen  via nasal cannula now.  No nausea or vomiting.  Denies lightheadedness or dizziness  Objective: Vitals:   08/15/24 0527 08/15/24 1025 08/15/24 1145 08/15/24 1357  BP: (!) 142/75 (!) 160/83 (!) 151/81   Pulse: 79 (!) 111 92   Resp: 14     Temp: 98.6 F (37 C)  97.9 F (36.6 C)   TempSrc:   Oral   SpO2: 90% 93%  95%  Weight:      Height:        Intake/Output Summary (Last 24 hours) at 08/15/2024 1836 Last data filed at 08/15/2024 1719 Gross per 24 hour  Intake 240 ml  Output 1075 ml  Net -835 ml   Filed Weights   08/14/24 1507  Weight: 72.8 kg   Examination: Physical Exam:  Constitutional: Thin elderly AAM in NAD Respiratory: Diminished to auscultation bilaterally with coarse breath sounds with some rhonchi but no appreciable wheezing, rales, or crackles. Normal  respiratory effort and patient is not tachypenic. No accessory muscle use. Unlabored Breathing but wearing Supplemental O2 via [place] Cardiovascular: RRR, no murmurs / rubs / gallops. S1 and S2 auscultated. No extremity edema. 2+ pedal pulses. No carotid bruits.  Abdomen: Soft, non-tender, non-distended.  Bowel sounds positive.  GU: Deferred. Musculoskeletal: No clubbing / cyanosis of digits/nails. No joint deformity upper and lower extremities. Skin: No rashes, lesions, ulcers on a limited skin evaluation. No induration; Warm and dry.  Neurologic: CN 2-12 grossly intact with no focal deficits. Sensation intact in all 4 Extremities, DTR normal. Strength 5/5 in all 4. Romberg sign cerebellar reflexes not assessed.  Psychiatric: Normal judgment and insight. Alert and oriented x 3. Normal mood and appropriate affect.   Data Reviewed: I have personally reviewed following labs and imaging studies  CBC: Recent Labs  Lab 08/14/24 0936 08/15/24 0514  WBC 11.3* 9.5  NEUTROABS 10.4*  --   HGB 12.9* 10.9*  HCT 39.0 32.8*  MCV 89.4 90.1  PLT 176 147*   Basic Metabolic Panel: Recent Labs  Lab 08/14/24 0936 08/14/24 1805 08/15/24 0514  NA 135  --  139  K 4.1  --  3.8  CL 102  --  106  CO2 20*  --  21*  GLUCOSE 202*  --  112*  BUN 19  --  17  CREATININE 1.56*  --  1.39*  CALCIUM  9.4  --  8.8*  MG  --  1.9 1.8  PHOS  --  2.0* 1.6*   GFR: Estimated Creatinine Clearance: 43.6 mL/min (A) (by C-G formula based on SCr of 1.39 mg/dL (H)). Liver Function Tests: Recent Labs  Lab 08/14/24 0936 08/15/24 0514  AST 22 26  ALT 8 6  ALKPHOS 112 115  BILITOT 0.5 0.4  PROT 7.6 6.5  ALBUMIN 3.8 3.3*   No results for input(s): LIPASE, AMYLASE in the last 168 hours. No results for input(s): AMMONIA in the last 168 hours. Coagulation Profile: Recent Labs  Lab 08/14/24 0936  INR 1.3*   Cardiac Enzymes: No results for input(s): CKTOTAL, CKMB, CKMBINDEX, TROPONINI in the last 168  hours. BNP (last 3 results) Recent Labs    08/14/24 1805  PROBNP 1,080.0*   HbA1C: No results for input(s): HGBA1C in the last 72 hours. CBG: Recent Labs  Lab 08/14/24 1811 08/14/24 2045  08/15/24 0737 08/15/24 1142 08/15/24 1626  GLUCAP 138* 101* 93 134* 111*   Lipid Profile: No results for input(s): CHOL, HDL, LDLCALC, TRIG, CHOLHDL, LDLDIRECT in the last 72 hours. Thyroid  Function Tests: No results for input(s): TSH, T4TOTAL, FREET4, T3FREE, THYROIDAB in the last 72 hours. Anemia Panel: No results for input(s): VITAMINB12, FOLATE, FERRITIN, TIBC, IRON , RETICCTPCT in the last 72 hours. Sepsis Labs: Recent Labs  Lab 08/14/24 0947 08/14/24 1207 08/14/24 1805 08/15/24 0514  PROCALCITON  --   --  0.60  --   LATICACIDVEN 2.8* 2.7* 3.7* 1.5   Recent Results (from the past 240 hours)  Culture, blood (Routine x 2)     Status: None (Preliminary result)   Collection Time: 08/14/24  9:36 AM   Specimen: BLOOD RIGHT ARM  Result Value Ref Range Status   Specimen Description   Final    BLOOD RIGHT ARM Performed at [facility], [address] [place], [place] [address]    Special Requests   Final    BOTTLES DRAWN AEROBIC AND ANAEROBIC Blood Culture results may not be optimal due to an inadequate volume of blood received in culture bottles Performed at [facility], [address] [place], [place] [address]    Culture   Final    NO GROWTH < 24 HOURS Performed at [facility], [address] [place] [address], [place], [place] [address]    Report Status PENDING  Incomplete  Resp panel by RT-PCR (RSV, Flu A&B, Covid)     Status: None   Collection Time: 08/14/24 10:03 AM   Specimen: Nasal Swab  Result Value Ref Range Status   SARS Coronavirus 2 by RT PCR NEGATIVE NEGATIVE Final    Comment: (NOTE) SARS-CoV-2 target nucleic acids are NOT DETECTED.  The SARS-CoV-2 RNA is generally detectable in upper respiratory specimens  during the acute phase of infection. The lowest concentration of SARS-CoV-2 viral copies this assay can detect is 138 copies/mL. A negative result does not preclude SARS-Cov-2 infection and should not be used as the sole basis for treatment or other patient management decisions. A negative result may occur with  improper specimen collection/handling, submission of specimen other than nasopharyngeal swab, presence of viral mutation(s) within the areas targeted by this assay, and inadequate number of viral copies(<138 copies/mL). A negative result must be combined with clinical observations, patient history, and epidemiological information. The expected result is Negative.  Fact Sheet for Patients:  bloggercourse.com  Fact Sheet for Healthcare Providers:  seriousbroker.it  This test is no t yet approved or cleared by the [place]  FDA and  has been authorized for detection and/or diagnosis of SARS-CoV-2 by FDA under an Emergency Use Authorization (EUA). This EUA will remain  in effect (meaning this test can be used) for the duration of the COVID-19 declaration under Section 564(b)(1) of the Act, 21 U.S.C.section 360bbb-3(b)(1), unless the authorization is terminated  or revoked sooner.       Influenza A by PCR NEGATIVE NEGATIVE Final   Influenza B by PCR NEGATIVE NEGATIVE Final    Comment: (NOTE) The Xpert Xpress SARS-CoV-2/FLU/RSV plus assay is intended as an aid in the diagnosis of influenza from Nasopharyngeal swab specimens and should not be used as a sole basis for treatment. Nasal washings and aspirates are unacceptable for Xpert Xpress SARS-CoV-2/FLU/RSV testing.  Fact Sheet for Patients: bloggercourse.com  Fact Sheet for Healthcare Providers: seriousbroker.it  This test is not yet approved or cleared by the [place]  FDA and  has been authorized for detection  and/or diagnosis of SARS-CoV-2 by FDA under an Emergency Use Authorization (EUA). This EUA will remain in effect (meaning this test can be used) for the duration of the COVID-19 declaration under Section 564(b)(1) of the Act, 21 U.S.C. section 360bbb-3(b)(1), unless the authorization is terminated or revoked.     Resp Syncytial Virus by PCR NEGATIVE NEGATIVE Final    Comment: (NOTE) Fact Sheet for Patients: bloggercourse.com  Fact Sheet for Healthcare Providers: seriousbroker.it  This test is not yet approved or cleared by the [place]  FDA and has been authorized for detection and/or diagnosis of SARS-CoV-2 by FDA under an Emergency Use Authorization (EUA). This EUA will remain in effect (meaning this test can be used) for the duration of the COVID-19 declaration under Section 564(b)(1) of the Act, 21 U.S.C. section 360bbb-3(b)(1), unless the authorization is terminated or revoked.  Performed at [facility], [address], [place], [place] [address]   Culture, blood (Routine x 2)     Status: None (Preliminary result)   Collection Time: 08/14/24 10:13 AM   Specimen: BLOOD  Result Value Ref Range Status   Specimen Description   Final    BLOOD RIGHT ANTECUBITAL Performed at [facility], [address], [place], [place] [address]    Special Requests   Final    BOTTLES DRAWN AEROBIC AND ANAEROBIC Blood Culture adequate volume Performed at [facility], [address], [place], [place] [address]    Culture   Final    NO GROWTH < 24 HOURS Performed at [facility], [address], [place], [place] [address]    Report Status PENDING  Incomplete  MRSA Next Gen by PCR, Nasal     Status: None   Collection Time: 08/15/24 12:51 PM   Specimen: Nasal Mucosa  Result Value Ref Range Status   MRSA by PCR Next Gen NOT DETECTED NOT DETECTED Final    Comment: (NOTE) The  GeneXpert MRSA Assay (FDA approved for NASAL specimens only), is one component of a comprehensive MRSA colonization surveillance program. It is not intended to diagnose MRSA infection nor to guide or monitor treatment for MRSA infections. Test performance is not FDA approved in patients less than 18 years old. Performed at [facility], [address], [place], [place] [address]   Respiratory (~20 pathogens) panel by PCR     Status: None   Collection Time: 08/15/24 12:52 PM   Specimen: Nasopharyngeal Swab; Respiratory  Result Value Ref Range Status   Adenovirus NOT DETECTED NOT DETECTED Final   Coronavirus 229E NOT DETECTED NOT DETECTED Final    Comment: (NOTE) The Coronavirus on the Respiratory Panel, DOES NOT test for the novel  Coronavirus (2019 nCoV)    Coronavirus HKU1 NOT DETECTED NOT DETECTED Final   Coronavirus NL63 NOT DETECTED NOT DETECTED Final   Coronavirus OC43 NOT DETECTED NOT DETECTED Final   Metapneumovirus NOT DETECTED NOT DETECTED Final   Rhinovirus / Enterovirus NOT DETECTED NOT DETECTED Final   Influenza A NOT DETECTED NOT DETECTED Final   Influenza B NOT DETECTED NOT DETECTED Final   Parainfluenza Virus 1 NOT DETECTED NOT DETECTED Final   Parainfluenza Virus 2 NOT DETECTED NOT DETECTED Final   Parainfluenza Virus 3 NOT DETECTED NOT DETECTED Final   Parainfluenza Virus 4 NOT DETECTED NOT DETECTED Final   Respiratory Syncytial Virus NOT DETECTED NOT DETECTED Final   Bordetella pertussis NOT DETECTED NOT DETECTED Final  Bordetella Parapertussis NOT DETECTED NOT DETECTED Final   Chlamydophila pneumoniae NOT DETECTED NOT DETECTED Final   Mycoplasma pneumoniae NOT DETECTED NOT DETECTED Final    Comment: Performed at [facility], [address], [place], [place] [address]    Radiology Studies: CT Angio Chest PE W and/or Wo Contrast Result Date: 08/14/2024 CLINICAL DATA:  History of primary right lung cancer presenting with chest pain  with weakness and cough. EXAM: CT ANGIOGRAPHY CHEST WITH CONTRAST TECHNIQUE: Multidetector CT imaging of the chest was performed using the standard protocol during bolus administration of intravenous contrast. Multiplanar CT image reconstructions and MIPs were obtained to evaluate the vascular anatomy. RADIATION DOSE REDUCTION: This exam was performed according to the departmental dose-optimization program which includes automated exposure control, adjustment of the mA and/or kV according to patient size and/or use of iterative reconstruction technique. CONTRAST:  60mL OMNIPAQUE  IOHEXOL  350 MG/ML SOLN COMPARISON:  July 16, 2024 FINDINGS: Cardiovascular: Satisfactory opacification of the pulmonary arteries to the segmental level. No evidence of pulmonary embolism. Normal heart size. No pericardial effusion. Mediastinum/Nodes: There is stable right hilar and AP window lymphadenopathy. Occlusion of the lower lobe branch of the bronchus intermedius is seen. The thyroid  gland and esophagus demonstrate no significant findings. Lungs/Pleura: There is extensive centrilobular and paraseptal emphysematous lung disease. Mild posterior left upper lobe infiltrate is seen with mild atelectasis and/or infiltrate present within the posterior aspect of the left lung base. A 5.3 cm x 3.4 cm right posterior infrahilar mass and adjacent, likely postobstructive right lower lobe consolidation is again seen (measured approximately 3.5 cm x 3.9 cm on the prior study). There is a small right pleural effusion. No pneumothorax is identified. Upper Abdomen: There is a small to moderate sized hiatal hernia. A large stool burden is present. A stable 4.5 cm simple cyst is noted within the right kidney. Musculoskeletal: No chest wall abnormality. No acute or significant osseous findings. Review of the MIP images confirms the above findings. IMPRESSION: 1. No evidence of pulmonary embolism. 2. 5.3 cm x 3.4 cm right posterior infrahilar mass and  adjacent, likely postobstructive right lower lobe consolidation, as described above. Is likely represents the patient's known primary cancer of the right lung. 3. Mild posterior left upper lobe infiltrate with mild left basilar atelectasis and/or infiltrate. 4. Small right pleural effusion. 5. Stable right hilar and mediastinal lymphadenopathy which may represent sequelae associated with metastatic disease. 6. Small to moderate sized hiatal hernia. 7. Stable simple right renal cyst. 8. Emphysema. Electronically Signed   By: Suzen Dials M.D.   On: 08/14/2024 12:45   DG Chest Port 1 View if patient is in a treatment room. Result Date: 08/14/2024 CLINICAL DATA:  Cough and chest pain. EXAM: PORTABLE CHEST 1 VIEW COMPARISON:  07/16/2024 FINDINGS: Streaky linear density at the lung bases is similar to prior likely atelectatic or scarring. No edema or focal airspace consolidation. The cardiopericardial silhouette is within normal limits for size. Telemetry leads overlie the chest. IMPRESSION: Streaky bibasilar atelectasis or scarring. Electronically Signed   By: Camellia Candle M.D.   On: 08/14/2024 11:11   Scheduled Meds:  allopurinol   100 mg Oral Daily   amLODipine   5 mg Oral QPC supper   apixaban   5 mg Oral BID   arformoterol   15 mcg Nebulization BID   budesonide  (PULMICORT ) nebulizer solution  0.25 mg Nebulization BID   docusate sodium   100 mg Oral Daily   ferrous sulfate   325 mg Oral Q48H   guaiFENesin   1,200 mg Oral BID   insulin  aspart  0-15 Units Subcutaneous TID WC   ipratropium-albuterol   3 mL Nebulization Q6H   metoprolol  tartrate  25 mg Oral BID   pantoprazole   40 mg Oral BID   polyethylene glycol  17 g Oral Daily   sodium chloride  flush  3 mL Intravenous Q12H   sodium chloride  flush  3 mL Intravenous Q12H   Continuous Infusions:  ceFEPime  (MAXIPIME ) IV 2 g (08/15/24 0519)   lactated ringers      vancomycin       LOS: 0 days   Alejandro Marker, DO [facility] Available via  Epic secure chat 7am-7pm After these hours, please refer to coverage provider listed on [email or web address] 08/15/2024, 6:36 PM  "

## 2024-08-15 NOTE — Care Management Obs Status (Signed)
 MEDICARE OBSERVATION STATUS NOTIFICATION   Patient Details  Name: Richard Navarro MRN: [id number] Date of Birth: [birth year]   Medicare Observation Status Notification Given:  Yes    MahabirNathanel, RN 08/15/2024, 1:00 PM

## 2024-08-16 ENCOUNTER — Inpatient Hospital Stay (HOSPITAL_COMMUNITY)

## 2024-08-16 DIAGNOSIS — C3431 Malignant neoplasm of lower lobe, right bronchus or lung: Secondary | ICD-10-CM | POA: Diagnosis not present

## 2024-08-16 DIAGNOSIS — I10 Essential (primary) hypertension: Secondary | ICD-10-CM | POA: Diagnosis not present

## 2024-08-16 DIAGNOSIS — Z87891 Personal history of nicotine dependence: Secondary | ICD-10-CM | POA: Diagnosis not present

## 2024-08-16 DIAGNOSIS — E1122 Type 2 diabetes mellitus with diabetic chronic kidney disease: Secondary | ICD-10-CM

## 2024-08-16 DIAGNOSIS — R6520 Severe sepsis without septic shock: Secondary | ICD-10-CM | POA: Diagnosis not present

## 2024-08-16 DIAGNOSIS — J189 Pneumonia, unspecified organism: Secondary | ICD-10-CM | POA: Diagnosis not present

## 2024-08-16 DIAGNOSIS — G9341 Metabolic encephalopathy: Secondary | ICD-10-CM | POA: Diagnosis not present

## 2024-08-16 DIAGNOSIS — A419 Sepsis, unspecified organism: Secondary | ICD-10-CM | POA: Diagnosis not present

## 2024-08-16 DIAGNOSIS — J9601 Acute respiratory failure with hypoxia: Secondary | ICD-10-CM

## 2024-08-16 DIAGNOSIS — N1832 Chronic kidney disease, stage 3b: Secondary | ICD-10-CM | POA: Diagnosis not present

## 2024-08-16 DIAGNOSIS — K219 Gastro-esophageal reflux disease without esophagitis: Secondary | ICD-10-CM | POA: Diagnosis not present

## 2024-08-16 DIAGNOSIS — J9611 Chronic respiratory failure with hypoxia: Secondary | ICD-10-CM | POA: Diagnosis not present

## 2024-08-16 LAB — CBC WITH DIFFERENTIAL/PLATELET
Abs Immature Granulocytes: 0.03 K/uL (ref 0.00–0.07)
Basophils Absolute: 0 K/uL (ref 0.0–0.1)
Basophils Relative: 0 %
Eosinophils Absolute: 0.1 K/uL (ref 0.0–0.5)
Eosinophils Relative: 2 %
HCT: 30.6 % — ABNORMAL LOW (ref 39.0–52.0)
Hemoglobin: 10.4 g/dL — ABNORMAL LOW (ref 13.0–17.0)
Immature Granulocytes: 1 %
Lymphocytes Relative: 12 %
Lymphs Abs: 0.8 K/uL (ref 0.7–4.0)
MCH: 29.9 pg (ref 26.0–34.0)
MCHC: 34 g/dL (ref 30.0–36.0)
MCV: 87.9 fL (ref 80.0–100.0)
Monocytes Absolute: 0.6 K/uL (ref 0.1–1.0)
Monocytes Relative: 9 %
Neutro Abs: 4.9 K/uL (ref 1.7–7.7)
Neutrophils Relative %: 76 %
Platelets: 133 K/uL — ABNORMAL LOW (ref 150–400)
RBC: 3.48 MIL/uL — ABNORMAL LOW (ref 4.22–5.81)
RDW: 15.5 % (ref 11.5–15.5)
WBC: 6.5 K/uL (ref 4.0–10.5)
nRBC: 0 % (ref 0.0–0.2)

## 2024-08-16 LAB — MAGNESIUM: Magnesium: 2.2 mg/dL (ref 1.7–2.4)

## 2024-08-16 LAB — COMPREHENSIVE METABOLIC PANEL WITH GFR
ALT: 10 U/L (ref 0–44)
AST: 16 U/L (ref 15–41)
Albumin: 3.3 g/dL — ABNORMAL LOW (ref 3.5–5.0)
Alkaline Phosphatase: 85 U/L (ref 38–126)
Anion gap: 11 (ref 5–15)
BUN: 15 mg/dL (ref 8–23)
CO2: 22 mmol/L (ref 22–32)
Calcium: 8.9 mg/dL (ref 8.9–10.3)
Chloride: 108 mmol/L (ref 98–111)
Creatinine, Ser: 1.56 mg/dL — ABNORMAL HIGH (ref 0.61–1.24)
GFR, Estimated: 45 mL/min — ABNORMAL LOW
Glucose, Bld: 122 mg/dL — ABNORMAL HIGH (ref 70–99)
Potassium: 3.8 mmol/L (ref 3.5–5.1)
Sodium: 141 mmol/L (ref 135–145)
Total Bilirubin: 0.5 mg/dL (ref 0.0–1.2)
Total Protein: 6.8 g/dL (ref 6.5–8.1)

## 2024-08-16 LAB — PHOSPHORUS: Phosphorus: 2.7 mg/dL (ref 2.5–4.6)

## 2024-08-16 LAB — GLUCOSE, CAPILLARY
Glucose-Capillary: 111 mg/dL — ABNORMAL HIGH (ref 70–99)
Glucose-Capillary: 124 mg/dL — ABNORMAL HIGH (ref 70–99)
Glucose-Capillary: 191 mg/dL — ABNORMAL HIGH (ref 70–99)

## 2024-08-16 MED ORDER — IPRATROPIUM-ALBUTEROL 0.5-2.5 (3) MG/3ML IN SOLN: 3.0000 mL | Freq: Four times a day (QID) | RESPIRATORY_TRACT | Status: DC | PRN

## 2024-08-16 MED ORDER — IPRATROPIUM-ALBUTEROL 0.5-2.5 (3) MG/3ML IN SOLN
3.0000 mL | Freq: Two times a day (BID) | RESPIRATORY_TRACT | Status: DC
  Administered 2024-08-16 (×2): 3 mL via RESPIRATORY_TRACT
  Filled 2024-08-16 (×2): qty 3

## 2024-08-16 NOTE — Progress Notes (Signed)
 " PROGRESS NOTE    Richard Navarro  FMW:[id number] DOB: 1946-05-20 DOA: 08/14/2024 PCP: Katrinka Garnette KIDD, MD   Brief Narrative:  Richard Navarro is a 78 year old male with extensive history of HTN, HLD, Right lung CA, CKD 3b, DM II, depression, GERD, PE (on Eliquis ) chronic iron  deficiency anemia, Gout, Falls, compression fracture and other comorbidites Presenting to ED with complaint of shortness of breath, cough, Tmax at home 101.5. Reports of progressive generalized weaknesses, reduced oral intake, difficulty ambulating. Also had a brief episode of chest pain. Ex-wife reports since his visit to the ED in Thanksgiving he has not been feeling well. He stopped his lung cancer treatment about 10-11 months ago, due to intolerance, low blood count. Being admitted for Sepsis 2/2 to CAP and had Acute on Chronic Respiratory Failure with Hypoxia.   Slowly improving and will continue current treatment and Anticipate D/C in the next 24-48 hours.   Assessment and Plan:  CAP (community acquired pneumonia): Left upper lobe per imaging -Broad-spectrum antibiotics as below -Will follow-up with cultures and Tx as below -WBC went from 9.5 -> 6.5 -CXR as below   History of Pulmonary Embolism: C/w Anticoagulation with Apixaban  5 mg po BID. CT angiogram on admit does not reveal any pulmonary embolism findings   Severe Sepsis (HCC) in the setting of Above: Patient meeting SIRS, early sepsis criteria with Acute respiratory failure likely due to pneumonia, with lactic acidosis, Temp, HR -CT angiogram reviewed, likely left upper lobe pneumonia, right upper lobe changes consistent with chronic cancer  -Per wife at home Tmax 101.5, RR 102, satting 98% on 1 L of oxygen  WBC 11.3, lactic acid 2.8, 2.7 -Initiating IV fluid, antibiotics per sepsis protocol likely source pneumonia -Per sepsis protocol, initiated broad-spectrum antibiotics of cefepime  and vancomycin , will narrow down antibiotics in next 24-48  hours -Will follow-up with blood cultures -Will try to obtain sputum culture for Gram stain and culture -Tx as Below  -PT/OT recommending Home HHPT/HHOT  Acute on Chronic Respiratory Failure w/ Hypoxia: Has supplemental O2 at Home and uses it PRN and now presenting with continuous need of supplemental oxygen ; Weaning off accordingly -Exacerbated by with underlying lung CA and new post obstructive pneumonia  -Continue IV Antibiotics w/ IV Cefepime  and IV Vancomycin , supplemental oxygen , nebs as below -Weaning off supplemental O2 with a goal of O2 greater 92% -Encouraged spirometer and flutter valve use -Repeat CXR today showed Rounded opacity at the right lung base, unchanged from prior exam. Minimal patchy opacity in the left mid lung, atelectasis versus pneumonia. Emphysema with chronic bronchial thickening.     Normocytic Anemia: Anemia of chronic disease along with iron  deficiency anemia. Hgb/Hct Trend:  Recent Labs  Lab 08/14/24 0936 08/15/24 0514 08/16/24 0625  HGB 12.9* 10.9* 10.4*  HCT 39.0 32.8* 30.6*  MCV 89.4 90.1 87.9  -C/w Ferrous Sulfate  325 mg po q48h -Likely has dilutional drop as well with IVF resuscitation. CTM for S/Sx of Bleeding; No overt bleeding noted. Repeat CBC in the AM    Chronic kidney disease (CKD) stage G3b/A1, moderately decreased glomerular filtration rate (GFR) between 30-44 mL/min/1.73 square meter and albuminuria creatinine ratio less than 30 mg/g [facility]): BUN/Cr Trend Recent Labs  Lab 08/14/24 0936 08/15/24 0514 08/16/24 0625  BUN 19 17 15   CREATININE 1.56* 1.39* 1.56*  -Was getting LR @ 150 mL/hr x20 hours and now stopped.  -Avoid Nephrotoxic Medications, Contrast Dyes, Hypotension and Dehydration to Ensure Adequate Renal Perfusion and will need to Renally Adjust Meds.  CTM & Trend & repeat CMP in the AM   Hyperlipidemia: On review of MAR does not seem to be on a Statin.     Essential Hypertension:  Blood pressure remains stable, early phases  of sepsis Reviewed home medications of Amlodipine , Lopressor  and currently held and will resume as able. CTM BP per Protocol Last BP reading was 135/70   Diabetes Mellitus with renal manifestation [facility]): Holding home regimen including Glipizide  5 mg po Daily and Metformin  500 mg po Daily: Recommending reduce dose or discontinuing Metformin  @ D/C due to ongoing CKD. C/w Moderate Novolog  SSI AC. CTM CBGs per Protocol. 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 Patient to will need to follow-up with Dr. Sherrod as outpatient   COPD (chronic obstructive pulmonary disease) [facility]): Currently stable no excessive wheezing no use of accessory muscles; Mild hypoxia needing 1-2 L of oxygen  to maintain O2 sat greater 90%. SpO2: 94 % O2 Flow Rate (L/min): 2 L/min -Start DuoNeb q6h. C/w Guaifenesin  1200 mg po BID, Incentive Spirometry and Flutter Valve -Also add Brovana  and Budesonide ; Hold Stiolto  -C/w Supplemental O2 via [place]   Former Smoker: Has quit few years ago; Currently not relapsed.    GERD (gastroesophageal reflux disease)/ GI Prophylaxis: Continue PPI w/ Pantoprazole  40 mg po BID  Hypoalbuminemia: Patient's Albumin Lvl went from 3.8 -> 3.3 x2. CTM & Trend & repeat CMP in the AM   DVT prophylaxis: SCDs Start: 08/14/24 1423 apixaban  (ELIQUIS ) tablet 5 mg    Code Status: Full Code Family Communication: No family present @ bedside  Disposition Plan:  Level of care: Telemetry Status is: Inpatient Remains inpatient appropriate because: Improving and anticipating D/C in the next 24-48 hours.   Consultants:  None  Procedures:  As delineated as above  Antimicrobials:  Anti-infectives (From  admission, onward)    Start     Dose/Rate Route Frequency Ordered Stop   08/15/24 1800  vancomycin  (VANCOCIN ) IVPB 1000 mg/200 mL premix        1,000 mg 200 mL/hr over 60 Minutes Intravenous Every 24 hours 08/14/24 1517     08/15/24 0500  ceFEPIme  (MAXIPIME ) 2 g in sodium chloride  0.9 % 100 mL IVPB        2 g 200 mL/hr over 30 Minutes Intravenous Every 12 hours 08/14/24 1517     08/14/24 1445  ceFEPIme  (MAXIPIME ) 2 g in sodium chloride  0.9 % 100 mL IVPB        2 g 200 mL/hr over 30 Minutes Intravenous  Once 08/14/24 1436 08/14/24 1724   08/14/24 1445  vancomycin  (VANCOREADY) IVPB 1500 mg/300 mL        1,500 mg 150 mL/hr over 120 Minutes Intravenous  Once 08/14/24 1436 08/14/24 1925   08/14/24 1430  vancomycin  (VANCOCIN ) IVPB 1000 mg/200 mL premix  Status:  Discontinued        1,000 mg 200 mL/hr over 60 Minutes Intravenous  Once 08/14/24 1426 08/14/24 1436   08/14/24 1430  aztreonam (AZACTAM) 2 g in sodium chloride  0.9 % 100 mL IVPB  Status:  Discontinued        2 g 200 mL/hr over 30 Minutes Intravenous  Once 08/14/24 1426  08/14/24 1436   08/14/24 1015  doxycycline  (VIBRAMYCIN ) 100 mg in sodium chloride  0.9 % 250 mL IVPB        100 mg 125 mL/hr over 120 Minutes Intravenous  Once 08/14/24 1004 08/14/24 1724   08/14/24 1015  cefTRIAXone  (ROCEPHIN ) 1 g in sodium chloride  0.9 % 100 mL IVPB        1 g 200 mL/hr over 30 Minutes Intravenous  Once 08/14/24 1004 08/14/24 1110       Subjective: Seen and examined at bedside and he is sitting in the chair getting nebulizer treatments.  No chest pain and thinks he is feeling better.  Able to cough up some more sputum.  Denies any lightheadedness or dizziness.  No other concerns or complaints at this time.  Objective: Vitals:   08/15/24 2132 08/16/24 0411 08/16/24 0500 08/16/24 1053  BP: 135/70 135/70    Pulse: (!) 104 74    Resp:  18    Temp:  98.4 F (36.9 C)    TempSrc:  Oral    SpO2:  96%  94%  Weight:   73.8 kg   Height:         Intake/Output Summary (Last 24 hours) at 08/16/2024 1913 Last data filed at 08/16/2024 1902 Gross per 24 hour  Intake 300 ml  Output 700 ml  Net -400 ml   Filed Weights   08/14/24 1507 08/16/24 0500  Weight: 72.8 kg 73.8 kg   Examination: Physical Exam:  Constitutional: Elderly chronically ill-appearing African-American male in no acute distress Respiratory: Diminished to auscultation bilaterally with some coarse breath sounds and some slight rhonchi and mild wheezing but no appreciable rales or crackles. Normal respiratory effort and patient is not tachypenic. No accessory muscle use.  Wearing supplemental oxygen  nasal cannula and receiving breathing treatment Cardiovascular: RRR, no murmurs / rubs / gallops. S1 and S2 auscultated.  Mild extremity edema.  Abdomen: Soft, non-tender, non-distended. Bowel sounds positive.  GU: Deferred. Musculoskeletal: No clubbing / cyanosis of digits/nails. No joint deformity upper and lower extremities.  Skin: No rashes, lesions, ulcers on limited skin evaluation. No induration; Warm and dry.  Neurologic: CN 2-12 grossly intact with no focal deficits. Romberg sign and cerebellar reflexes not assessed.  Psychiatric: Normal judgment and insight. Alert and oriented x 3. Normal mood and appropriate affect.   Data Reviewed: I have personally reviewed following labs and imaging studies  CBC: Recent Labs  Lab 08/14/24 0936 08/15/24 0514 08/16/24 0625  WBC 11.3* 9.5 6.5  NEUTROABS 10.4*  --  4.9  HGB 12.9* 10.9* 10.4*  HCT 39.0 32.8* 30.6*  MCV 89.4 90.1 87.9  PLT 176 147* 133*   Basic Metabolic Panel: Recent Labs  Lab 08/14/24 0936 08/14/24 1805 08/15/24 0514 08/16/24 0625  NA 135  --  139 141  K 4.1  --  3.8 3.8  CL 102  --  106 108  CO2 20*  --  21* 22  GLUCOSE 202*  --  112* 122*  BUN 19  --  17 15  CREATININE 1.56*  --  1.39* 1.56*  CALCIUM  9.4  --  8.8* 8.9  MG  --  1.9 1.8 2.2  PHOS  --  2.0* 1.6* 2.7   GFR: Estimated  Creatinine Clearance: 39.4 mL/min (A) (by C-G formula based on SCr of 1.56 mg/dL (H)). Liver Function Tests: Recent Labs  Lab 08/14/24 0936 08/15/24 0514 08/16/24 0625  AST 22 26 16   ALT 8 6 10   ALKPHOS 112 115  85  BILITOT 0.5 0.4 0.5  PROT 7.6 6.5 6.8  ALBUMIN 3.8 3.3* 3.3*   No results for input(s): LIPASE, AMYLASE in the last 168 hours. No results for input(s): AMMONIA in the last 168 hours. Coagulation Profile: Recent Labs  Lab 08/14/24 0936  INR 1.3*   Cardiac Enzymes: No results for input(s): CKTOTAL, CKMB, CKMBINDEX, TROPONINI in the last 168 hours. BNP (last 3 results) Recent Labs    08/14/24 1805  PROBNP 1,080.0*   HbA1C: No results for input(s): HGBA1C in the last 72 hours. CBG: Recent Labs  Lab 08/15/24 1626 08/15/24 1840 08/15/24 2125 08/16/24 0801 08/16/24 1706  GLUCAP 111* 155* 153* 111* 124*   Lipid Profile: No results for input(s): CHOL, HDL, LDLCALC, TRIG, CHOLHDL, LDLDIRECT in the last 72 hours. Thyroid  Function Tests: No results for input(s): TSH, T4TOTAL, FREET4, T3FREE, THYROIDAB in the last 72 hours. Anemia Panel: No results for input(s): VITAMINB12, FOLATE, FERRITIN, TIBC, IRON , RETICCTPCT in the last 72 hours. Sepsis Labs: Recent Labs  Lab 08/14/24 0947 08/14/24 1207 08/14/24 1805 08/15/24 0514  PROCALCITON  --   --  0.60  --   LATICACIDVEN 2.8* 2.7* 3.7* 1.5    Recent Results (from the past 240 hours)  Culture, blood (Routine x 2)     Status: None (Preliminary result)   Collection Time: 08/14/24  9:36 AM   Specimen: BLOOD RIGHT ARM  Result Value Ref Range Status   Specimen Description   Final    BLOOD RIGHT ARM Performed at [facility], [address], [place], [place] [address]    Special Requests   Final    BOTTLES DRAWN AEROBIC AND ANAEROBIC Blood Culture results may not be optimal due to an inadequate volume of blood received in culture bottles Performed at  [facility], [address], [place], [place] [address]    Culture   Final    NO GROWTH 2 DAYS Performed at [facility], [address] [place], [place] [address]    Report Status PENDING  Incomplete  Resp panel by RT-PCR (RSV, Flu A&B, Covid)     Status: None   Collection Time: 08/14/24 10:03 AM   Specimen: Nasal Swab  Result Value Ref Range Status   SARS Coronavirus 2 by RT PCR NEGATIVE NEGATIVE Final    Comment: (NOTE) SARS-CoV-2 target nucleic acids are NOT DETECTED.  The SARS-CoV-2 RNA is generally detectable in upper respiratory specimens during the acute phase of infection. The lowest concentration of SARS-CoV-2 viral copies this assay can detect is 138 copies/mL. A negative result does not preclude SARS-Cov-2 infection and should not be used as the sole basis for treatment or other patient management decisions. A negative result may occur with  improper specimen collection/handling, submission of specimen other than nasopharyngeal swab, presence of viral mutation(s) within the areas targeted by this assay, and inadequate number of viral copies(<138 copies/mL). A negative result must be combined with clinical observations, patient history, and epidemiological information. The expected result is Negative.  Fact Sheet for Patients:  bloggercourse.com  Fact Sheet for Healthcare Providers:  seriousbroker.it  This test is no t yet approved or cleared by the [place]  FDA and  has been authorized for detection and/or diagnosis of SARS-CoV-2 by FDA under an Emergency Use Authorization (EUA). This EUA will remain  in effect (meaning this test can be used) for the duration of the COVID-19 declaration under Section 564(b)(1) of the Act, 21 U.S.C.section 360bbb-3(b)(1), unless the authorization  is terminated  or revoked sooner.       Influenza A by PCR NEGATIVE NEGATIVE Final   Influenza B by PCR  NEGATIVE NEGATIVE Final    Comment: (NOTE) The Xpert Xpress SARS-CoV-2/FLU/RSV plus assay is intended as an aid in the diagnosis of influenza from Nasopharyngeal swab specimens and should not be used as a sole basis for treatment. Nasal washings and aspirates are unacceptable for Xpert Xpress SARS-CoV-2/FLU/RSV testing.  Fact Sheet for Patients: bloggercourse.com  Fact Sheet for Healthcare Providers: seriousbroker.it  This test is not yet approved or cleared by the [place]  FDA and has been authorized for detection and/or diagnosis of SARS-CoV-2 by FDA under an Emergency Use Authorization (EUA). This EUA will remain in effect (meaning this test can be used) for the duration of the COVID-19 declaration under Section 564(b)(1) of the Act, 21 U.S.C. section 360bbb-3(b)(1), unless the authorization is terminated or revoked.     Resp Syncytial Virus by PCR NEGATIVE NEGATIVE Final    Comment: (NOTE) Fact Sheet for Patients: bloggercourse.com  Fact Sheet for Healthcare Providers: seriousbroker.it  This test is not yet approved or cleared by the [place]  FDA and has been authorized for detection and/or diagnosis of SARS-CoV-2 by FDA under an Emergency Use Authorization (EUA). This EUA will remain in effect (meaning this test can be used) for the duration of the COVID-19 declaration under Section 564(b)(1) of the Act, 21 U.S.C. section 360bbb-3(b)(1), unless the authorization is terminated or revoked.  Performed at [facility], [address] [place], [place] [address]   Culture, blood (Routine x 2)     Status: None (Preliminary result)   Collection Time: 08/14/24 10:13 AM   Specimen: BLOOD  Result Value Ref Range Status   Specimen Description   Final    BLOOD RIGHT ANTECUBITAL Performed at [facility], [address],  [place], [place] [address]    Special Requests   Final    BOTTLES DRAWN AEROBIC AND ANAEROBIC Blood Culture adequate volume Performed at [facility], [address] [place], [place] [address]    Culture   Final    NO GROWTH 2 DAYS Performed at [facility], [address] [place], [place] [address]    Report Status PENDING  Incomplete  MRSA Next Gen by PCR, Nasal     Status: None   Collection Time: 08/15/24 12:51 PM   Specimen: Nasal Mucosa  Result Value Ref Range Status   MRSA by PCR Next Gen NOT DETECTED NOT DETECTED Final    Comment: (NOTE) The GeneXpert MRSA Assay (FDA approved for NASAL specimens only), is one component of a comprehensive MRSA colonization surveillance program. It is not intended to diagnose MRSA infection nor to guide or monitor treatment for MRSA infections. Test performance is not FDA approved in patients less than 70 years old. Performed at [facility], [address], [place], [place] [address]   Respiratory (~20 pathogens) panel by PCR     Status: None   Collection Time: 08/15/24 12:52 PM   Specimen: Nasopharyngeal Swab; Respiratory  Result Value Ref Range Status   Adenovirus NOT DETECTED NOT DETECTED Final   Coronavirus 229E NOT DETECTED NOT DETECTED Final    Comment: (NOTE) The Coronavirus on the Respiratory Panel, DOES NOT test for the novel  Coronavirus (2019 nCoV)    Coronavirus HKU1 NOT DETECTED NOT DETECTED Final   Coronavirus NL63 NOT DETECTED NOT DETECTED Final   Coronavirus OC43  NOT DETECTED NOT DETECTED Final   Metapneumovirus NOT DETECTED NOT DETECTED Final   Rhinovirus / Enterovirus NOT DETECTED NOT DETECTED Final   Influenza A NOT DETECTED NOT DETECTED Final   Influenza B NOT DETECTED NOT DETECTED Final   Parainfluenza Virus 1 NOT DETECTED NOT DETECTED Final   Parainfluenza Virus 2 NOT DETECTED NOT DETECTED Final   Parainfluenza Virus 3 NOT DETECTED NOT DETECTED Final   Parainfluenza Virus  4 NOT DETECTED NOT DETECTED Final   Respiratory Syncytial Virus NOT DETECTED NOT DETECTED Final   Bordetella pertussis NOT DETECTED NOT DETECTED Final   Bordetella Parapertussis NOT DETECTED NOT DETECTED Final   Chlamydophila pneumoniae NOT DETECTED NOT DETECTED Final   Mycoplasma pneumoniae NOT DETECTED NOT DETECTED Final    Comment: Performed at [facility], [address], [place], [place] [address]    Radiology Studies: DG CHEST PORT 1 VIEW Result Date: 08/16/2024 CLINICAL DATA:  Shortness of breath. EXAM: PORTABLE CHEST 1 VIEW COMPARISON:  08/14/2024 radiographs and CT FINDINGS: The heart is normal in size. Mediastinal contours are unchanged. Again seen emphysema with diffuse bronchial thickening. Rounded opacity at the right lung base, unchanged. Minimal patchy opacity in the left mid lung. Small right pleural effusion on CT is not well demonstrated by radiograph. No pneumothorax. IMPRESSION: 1. Rounded opacity at the right lung base, unchanged from prior exam. 2. Minimal patchy opacity in the left mid lung, atelectasis versus pneumonia. 3. Emphysema with chronic bronchial thickening. Electronically Signed   By: Andrea Gasman M.D.   On: 08/16/2024 12:22   Scheduled Meds:  allopurinol   100 mg Oral Daily   amLODipine   5 mg Oral QPC supper   apixaban   5 mg Oral BID   arformoterol   15 mcg Nebulization BID   budesonide  (PULMICORT ) nebulizer solution  0.25 mg Nebulization BID   docusate sodium   100 mg Oral Daily   ferrous sulfate   325 mg Oral Q48H   guaiFENesin   1,200 mg Oral BID   insulin  aspart  0-15 Units Subcutaneous TID WC   ipratropium-albuterol   3 mL Nebulization BID   metoprolol  tartrate  25 mg Oral BID   pantoprazole   40 mg Oral BID   polyethylene glycol  17 g Oral Daily   sodium chloride  flush  3 mL Intravenous Q12H   sodium chloride  flush  3 mL Intravenous Q12H   Continuous Infusions:  ceFEPime  (MAXIPIME ) IV 2 g (08/16/24 0950)   lactated ringers      vancomycin        LOS: 1 day   Alejandro Marker, DO [facility] Available via Epic secure chat 7am-7pm After these hours, please refer to coverage provider listed on [email or web address] 08/16/2024, 7:13 PM  "

## 2024-08-16 NOTE — Progress Notes (Signed)
 Physical Therapy Treatment Patient Details Name: Richard Navarro MRN: [id number] DOB: 1945/09/16 Today's Date: 08/16/2024   History of Present Illness 78 year old male Presenting to ED with complaint of shortness of breath, cough. Dx of PNA. Pt with extensive history of HTN, HLD, Right lung CA, CKD 3b, DM II, depression, GERD, PE (on Eliquis ) chronic iron  deficiency anemia, Gout, Falls, compression fracture.    PT Comments  The patient ambulated x ~ 50 with Rw on 2 L [place], spo2 94%. The patient will benefit from HHPT.     If plan is discharge home, recommend the following: A little help with bathing/dressing/bathroom;Assistance with cooking/housework;Assist for transportation;Help with stairs or ramp for entrance   Can travel by private vehicle        Equipment Recommendations  None recommended by PT    Recommendations for Other Services       Precautions / Restrictions Precautions Precautions: Fall Recall of Precautions/Restrictions: Intact Precaution/Restrictions Comments: 2 falls in past 6 months, monitor sats Restrictions Weight Bearing Restrictions Per Provider Order: No     Mobility  Bed Mobility Overal bed mobility: Modified Independent             General bed mobility comments: HOB up, used rail    Transfers Overall transfer level: Needs assistance Equipment used: Rolling walker (2 wheels) Transfers: Sit to/from Stand Sit to Stand: Supervision                Ambulation/Gait Ambulation/Gait assistance: Contact guard assist Gait Distance (Feet): 50 Feet Assistive device: Rolling walker (2 wheels) Gait Pattern/deviations: Step-through pattern, Trunk flexed Gait velocity: decr     General Gait Details: cues for sequence. Maintained on 2 L [place], SPo2 96%   Stairs             Wheelchair Mobility     Tilt Bed    Modified Rankin (Stroke Patients Only)       Balance Overall balance assessment: Needs assistance, History of  Falls Sitting-balance support: No upper extremity supported, Feet supported Sitting balance-Leahy Scale: Good     Standing balance support: Bilateral upper extremity supported, During functional activity, Reliant on assistive device for balance Standing balance-Leahy Scale: Fair                              Hotel Manager: No apparent difficulties Factors Affecting Communication: Hearing impaired  Cognition Arousal: Alert Behavior During Therapy: WFL for tasks assessed/performed   PT - Cognitive impairments: No apparent impairments                         Following commands: Intact      Cueing Cueing Techniques: Verbal cues  Exercises      General Comments General comments (skin integrity, edema, etc.):      Pertinent Vitals/Pain Pain Assessment Pain Assessment: No/denies pain    Home Living Family/patient expects to be discharged to:: Private residence Living Arrangements: Spouse/significant other Available Help at Discharge: Family;Available 24 hours/day Type of Home: House Home Access: Stairs to enter Entrance Stairs-Rails: None Entrance Stairs-Number of Steps: 1 Alternate Level Stairs-Number of Steps: x1 flight Home Layout: Two level Home Equipment: Agricultural Consultant (2 wheels);Shower seat;Grab bars - tub/shower;BSC/3in1;Rollator (4 wheels) Additional Comments: Has 2L home O2, does not wear all the time; lives with ex wife    Prior Function            PT Goals (current  goals can now be found in the care plan section) Progress towards PT goals: Progressing toward goals    Frequency           PT Plan      Co-evaluation              AM-PAC PT 6 Clicks Mobility   Outcome Measure  Help needed turning from your back to your side while in a flat bed without using bedrails?: None Help needed moving from lying on your back to sitting on the side of a flat bed without using bedrails?: A Little Help  needed moving to and from a bed to a chair (including a wheelchair)?: A Little Help needed standing up from a chair using your arms (e.g., wheelchair or bedside chair)?: A Little Help needed to walk in hospital room?: A Little Help needed climbing 3-5 steps with a railing? : A Little 6 Click Score: 19    End of Session Equipment Utilized During Treatment: Gait belt;Oxygen  Activity Tolerance: Patient tolerated treatment well Patient left: in chair;with chair alarm set;with call bell/phone within reach Nurse Communication: Mobility status PT Visit Diagnosis: Difficulty in walking, not elsewhere classified (R26.2);History of falling (Z91.81)     Time: 9076-9054 PT Time Calculation (min) (ACUTE ONLY): 22 min  Charges:    $Gait Training: 8-22 mins PT General Charges $$ ACUTE PT VISIT: 1 Visit                     Darice Potters PT Acute Rehabilitation Services Office [phone]    Potters Darice Norris 08/16/2024, 2:26 PM

## 2024-08-16 NOTE — Plan of Care (Signed)

## 2024-08-16 NOTE — Evaluation (Addendum)
 Occupational Therapy Evaluation Patient Details Name: Richard Navarro MRN: [id number] DOB: 12/18/1945 Today's Date: 08/16/2024   History of Present Illness   78 year old male Presenting to ED with complaint of shortness of breath, cough. Dx of PNA. Pt with extensive history of HTN, HLD, Right lung CA, CKD 3b, DM II, depression, GERD, PE (on Eliquis ) chronic iron  deficiency anemia, Gout, Falls, compression fracture.     Clinical Impressions Pt admitted for above and presents with problem list below.  Pt on 2L supplemental O2 during session with VSS.  He demonstrates ability to complete Adls with up to contact guard assist, transfers and in room mobility without AD given contact guard assistance.  Patient and ex spouse educated on DME for safety, including RW, Pill[place], and BSC (frame over toilet) but pt very hesitant to understand benefits/importance for safety.  Pt continues to decline use of [place] and reports taking O2 off during shower. Cognitively, pt with poor online awareness, decreased problem solving. VSS on 2L supplemental O2 via [place]. Anticipate pt will best benefit from [facility] services at dc to optimize safety and independence with ADLs.  Will follow acutely.      If plan is discharge home, recommend the following:   A little help with walking and/or transfers;A little help with bathing/dressing/bathroom;Assistance with cooking/housework;Direct supervision/assist for medications management;Direct supervision/assist for financial management;Assist for transportation;Help with stairs or ramp for entrance;Supervision due to cognitive status     Functional Status Assessment   Patient has had a recent decline in their functional status and demonstrates the ability to make significant improvements in function in a reasonable and predictable amount of time.     Equipment Recommendations   None recommended by OT     Recommendations for Other Services          Precautions/Restrictions   Precautions Precautions: Fall Recall of Precautions/Restrictions: Intact Precaution/Restrictions Comments: 2 falls in past 6 months, monitor sats Restrictions Weight Bearing Restrictions Per Provider Order: No     Mobility Bed Mobility               General bed mobility comments: OOB in recliner upon entry    Transfers Overall transfer level: Needs assistance   Transfers: Sit to/from Stand Sit to Stand: Contact guard assist           General transfer comment: contact guard for safety, pt declines using RW for OT      Balance Overall balance assessment: Needs assistance, History of Falls Sitting-balance support: No upper extremity supported, Feet supported Sitting balance-Leahy Scale: Good     Standing balance support: No upper extremity supported, During functional activity Standing balance-Leahy Scale: Poor Standing balance comment: relies on external support                           ADL either performed or assessed with clinical judgement   ADL Overall ADL's : Needs assistance/impaired     Grooming: Set up;Sitting           Upper Body Dressing : Set up;Sitting   Lower Body Dressing: Contact guard assist;Sit to/from stand   Toilet Transfer: Contact guard assist;Ambulation Toilet Transfer Details (indicate cue type and reason): simulated in room, no AD         Functional mobility during ADLs: Contact guard assist General ADL Comments: no AD for in room mobility, would benefit from RW     Vision   Vision Assessment?: No apparent visual deficits  Perception         Praxis         Pertinent Vitals/Pain Pain Assessment Pain Assessment: No/denies pain     Extremity/Trunk Assessment Upper Extremity Assessment Upper Extremity Assessment: Overall WFL for tasks assessed   Lower Extremity Assessment Lower Extremity Assessment: Defer to PT evaluation       Communication  Communication Communication: No apparent difficulties Factors Affecting Communication: Hearing impaired   Cognition Arousal: Alert Behavior During Therapy: WFL for tasks assessed/performed Cognition: Cognition impaired     Awareness: Online awareness impaired Memory impairment (select all impairments): Short-term memory, Working Biochemist, Clinical functioning impairment (select all impairments): Problem solving, Reasoning OT - Cognition Comments: pt oriented and following commands.  Pt aware of deficits but demonstrates poor awareness to safety and decreased problem sovling. poor recall of using RW with PT earlier today. likley baseline personality, but pt not open to compensatory strategies                 Following commands: Intact       Cueing  General Comments   Cueing Techniques: Verbal cues  pt educated on comepsantory techniques and use of DME (walker, [place] in shower) but pt continues to deny need and becomes increasingly agitated when educated on benefits   Exercises     Shoulder Instructions      Home Living Family/patient expects to be discharged to:: Private residence Living Arrangements: Spouse/significant other Available Help at Discharge: Family;Available 24 hours/day Type of Home: House Home Access: Stairs to enter [employer] of Steps: 1 Entrance Stairs-Rails: None Home Layout: Two level Alternate Level Stairs-Number of Steps: x1 flight Alternate Level Stairs-Rails: Right Bathroom Shower/Tub: Producer, Television/film/video: Standard     Home Equipment: Agricultural Consultant (2 wheels);Shower seat;Grab bars - tub/shower;BSC/3in1;Rollator (4 wheels)   Additional Comments: Has 2L home O2, does not wear all the time; lives with ex wife      Prior Functioning/Environment Prior Level of Function : Independent/Modified Independent;Driving;History of Falls (last six months)             Mobility Comments: uses RW at times, 2 falls in past 6  months, 2L O2 at home prn ADLs Comments: Reports able to manage ADLs, stands for showers and removes O2 for showers, ex spouse completes IADLs    OT Problem List: Decreased strength;Decreased activity tolerance;Impaired balance (sitting and/or standing);Cardiopulmonary status limiting activity;Decreased knowledge of precautions;Decreased knowledge of use of DME or AE;Decreased safety awareness;Decreased cognition   OT Treatment/Interventions: Self-care/ADL training;Therapeutic exercise;Energy conservation;DME and/or AE instruction;Therapeutic activities;Cognitive remediation/compensation;Patient/family education;Balance training      OT Goals(Current goals can be found in the care plan section)   Acute Rehab OT Goals Patient Stated Goal: home OT Goal Formulation: With patient Time For Goal Achievement: 08/30/24 Potential to Achieve Goals: Good   OT Frequency:  Min 2X/week    Co-evaluation              AM-PAC OT 6 Clicks Daily Activity     Outcome Measure Help from another person eating meals?: None Help from another person taking care of personal grooming?: A Little Help from another person toileting, which includes using toliet, bedpan, or urinal?: A Little Help from another person bathing (including washing, rinsing, drying)?: A Little Help from another person to put on and taking off regular upper body clothing?: A Little Help from another person to put on and taking off regular lower body clothing?: A Little 6 Click Score:  19   End of Session Equipment Utilized During Treatment: Oxygen  (2L) Nurse Communication: Mobility status;Precautions  Activity Tolerance: Patient tolerated treatment well Patient left: in chair;with call bell/phone within reach;with chair alarm set;with family/visitor present  OT Visit Diagnosis: Other abnormalities of gait and mobility (R26.89);Muscle weakness (generalized) (M62.81);History of falling (Z91.81);Other symptoms and signs involving  cognitive function                Time: 8783-8752 OT Time Calculation (min): 31 min Charges:  OT General Charges $OT Visit: 1 Visit OT Evaluation $OT Eval Moderate Complexity: 1 Mod OT Treatments $Self Care/Home Management : 8-22 mins  Etta NOVAK, OT Acute Rehabilitation Services Office [phone] Secure Chat Preferred    Etta GORMAN Hope 08/16/2024, 1:57 PM

## 2024-08-17 ENCOUNTER — Inpatient Hospital Stay (HOSPITAL_COMMUNITY)

## 2024-08-17 DIAGNOSIS — J9611 Chronic respiratory failure with hypoxia: Secondary | ICD-10-CM | POA: Diagnosis not present

## 2024-08-17 DIAGNOSIS — K219 Gastro-esophageal reflux disease without esophagitis: Secondary | ICD-10-CM | POA: Diagnosis not present

## 2024-08-17 DIAGNOSIS — C3431 Malignant neoplasm of lower lobe, right bronchus or lung: Secondary | ICD-10-CM | POA: Diagnosis not present

## 2024-08-17 DIAGNOSIS — A419 Sepsis, unspecified organism: Secondary | ICD-10-CM | POA: Diagnosis not present

## 2024-08-17 DIAGNOSIS — I10 Essential (primary) hypertension: Secondary | ICD-10-CM | POA: Diagnosis not present

## 2024-08-17 DIAGNOSIS — N1832 Chronic kidney disease, stage 3b: Secondary | ICD-10-CM | POA: Diagnosis not present

## 2024-08-17 DIAGNOSIS — Z87891 Personal history of nicotine dependence: Secondary | ICD-10-CM | POA: Diagnosis not present

## 2024-08-17 DIAGNOSIS — J9601 Acute respiratory failure with hypoxia: Secondary | ICD-10-CM | POA: Diagnosis not present

## 2024-08-17 DIAGNOSIS — R6520 Severe sepsis without septic shock: Secondary | ICD-10-CM | POA: Diagnosis not present

## 2024-08-17 DIAGNOSIS — E1122 Type 2 diabetes mellitus with diabetic chronic kidney disease: Secondary | ICD-10-CM | POA: Diagnosis not present

## 2024-08-17 DIAGNOSIS — J189 Pneumonia, unspecified organism: Secondary | ICD-10-CM | POA: Diagnosis not present

## 2024-08-17 DIAGNOSIS — G9341 Metabolic encephalopathy: Secondary | ICD-10-CM | POA: Diagnosis not present

## 2024-08-17 LAB — COMPREHENSIVE METABOLIC PANEL WITH GFR
ALT: 9 U/L (ref 0–44)
AST: 18 U/L (ref 15–41)
Albumin: 3.2 g/dL — ABNORMAL LOW (ref 3.5–5.0)
Alkaline Phosphatase: 81 U/L (ref 38–126)
Anion gap: 14 (ref 5–15)
BUN: 15 mg/dL (ref 8–23)
CO2: 18 mmol/L — ABNORMAL LOW (ref 22–32)
Calcium: 8.8 mg/dL — ABNORMAL LOW (ref 8.9–10.3)
Chloride: 106 mmol/L (ref 98–111)
Creatinine, Ser: 1.4 mg/dL — ABNORMAL HIGH (ref 0.61–1.24)
GFR, Estimated: 51 mL/min — ABNORMAL LOW
Glucose, Bld: 123 mg/dL — ABNORMAL HIGH (ref 70–99)
Potassium: 3.8 mmol/L (ref 3.5–5.1)
Sodium: 138 mmol/L (ref 135–145)
Total Bilirubin: 0.4 mg/dL (ref 0.0–1.2)
Total Protein: 6.8 g/dL (ref 6.5–8.1)

## 2024-08-17 LAB — CBC WITH DIFFERENTIAL/PLATELET
Abs Immature Granulocytes: 0.02 K/uL (ref 0.00–0.07)
Basophils Absolute: 0 K/uL (ref 0.0–0.1)
Basophils Relative: 0 %
Eosinophils Absolute: 0.2 K/uL (ref 0.0–0.5)
Eosinophils Relative: 3 %
HCT: 32 % — ABNORMAL LOW (ref 39.0–52.0)
Hemoglobin: 10.7 g/dL — ABNORMAL LOW (ref 13.0–17.0)
Immature Granulocytes: 0 %
Lymphocytes Relative: 12 %
Lymphs Abs: 0.6 K/uL — ABNORMAL LOW (ref 0.7–4.0)
MCH: 29.6 pg (ref 26.0–34.0)
MCHC: 33.4 g/dL (ref 30.0–36.0)
MCV: 88.4 fL (ref 80.0–100.0)
Monocytes Absolute: 0.4 K/uL (ref 0.1–1.0)
Monocytes Relative: 8 %
Neutro Abs: 4 K/uL (ref 1.7–7.7)
Neutrophils Relative %: 77 %
Platelets: 161 K/uL (ref 150–400)
RBC: 3.62 MIL/uL — ABNORMAL LOW (ref 4.22–5.81)
RDW: 15.5 % (ref 11.5–15.5)
WBC: 5.2 K/uL (ref 4.0–10.5)
nRBC: 0 % (ref 0.0–0.2)

## 2024-08-17 LAB — GLUCOSE, CAPILLARY
Glucose-Capillary: 114 mg/dL — ABNORMAL HIGH (ref 70–99)
Glucose-Capillary: 115 mg/dL — ABNORMAL HIGH (ref 70–99)
Glucose-Capillary: 103 mg/dL — ABNORMAL HIGH (ref 70–99)
Glucose-Capillary: 152 mg/dL — ABNORMAL HIGH (ref 70–99)

## 2024-08-17 LAB — MAGNESIUM: Magnesium: 2.2 mg/dL (ref 1.7–2.4)

## 2024-08-17 LAB — PHOSPHORUS: Phosphorus: 2.8 mg/dL (ref 2.5–4.6)

## 2024-08-17 MED ORDER — SODIUM CHLORIDE 0.9 % IV SOLN
1.0000 g | INTRAVENOUS | Status: DC
  Administered 2024-08-17 – 2024-08-18 (×2): 1 g via INTRAVENOUS
  Filled 2024-08-17: qty 10

## 2024-08-17 MED ORDER — SODIUM BICARBONATE 650 MG PO TABS
650.0000 mg | ORAL_TABLET | Freq: Two times a day (BID) | ORAL | Status: DC
  Administered 2024-08-17 – 2024-08-19 (×5): 650 mg via ORAL
  Filled 2024-08-17 (×2): qty 1

## 2024-08-17 MED ORDER — DOXYCYCLINE HYCLATE 100 MG PO TABS
100.0000 mg | ORAL_TABLET | Freq: Two times a day (BID) | ORAL | Status: DC
  Administered 2024-08-17 – 2024-08-19 (×4): 100 mg via ORAL
  Filled 2024-08-17: qty 1

## 2024-08-17 MED ORDER — SODIUM CHLORIDE 0.9 % IV SOLN: INTRAVENOUS | Status: DC

## 2024-08-17 NOTE — Plan of Care (Signed)

## 2024-08-17 NOTE — Progress Notes (Signed)
 Pharmacy Antibiotic Note  Richard Navarro is a 78 y.o. male admitted on 08/14/2024 with pneumonia.  Pharmacy has been consulted for vancomycin  and aztreonam dosing.  Now day #4 of abx for sepsis / CAP. Afebrile, WBC wnl. All cultures negative so far.  Plan: Vancomycin  1000 mg IV q24h Cefepime  2 g IV q12h Monitor clinical picture, renal function F/U C&S, abx deescalation / LOT  Would recommend to stop vancomycin  today and consider transition of cefepime  to PO abx to complete 5-7 day course  Height: 6' 3 (190.5 cm) Weight: 73.8 kg (162 lb 11.2 oz) IBW/kg (Calculated) : 84.5  Temp (24hrs), Avg:98.4 F (36.9 C), Min:98.4 F (36.9 C), Max:98.4 F (36.9 C)  Recent Labs  Lab 08/14/24 0936 08/14/24 0947 08/14/24 1207 08/14/24 1805 08/15/24 0514 08/16/24 0625 08/17/24 0624  WBC 11.3*  --   --   --  9.5 6.5 5.2  CREATININE 1.56*  --   --   --  1.39* 1.56* 1.40*  LATICACIDVEN  --  2.8* 2.7* 3.7* 1.5  --   --     Estimated Creatinine Clearance: 43.9 mL/min (A) (by C-G formula based on SCr of 1.4 mg/dL (H)).    Allergies[1]  Antimicrobials this admission: Cefepime  12/25 >> Vancomycin  12/25 >> Ceftriaxone /doxycycline  12/25 x 1  Dose adjustments this admission: NA  Microbiology results: 12/25 BCx: ngtd 12/25 MRSA PCR: ordered  Thank you for allowing pharmacy to be a part of this patients care.  Rankin Dee, PharmD, BCPS, BCIDP Clinical Pharmacist 08/17/2024 8:49 AM      [1]  Allergies Allergen Reactions   Afinitor  [Everolimus ] Swelling and Other (See Comments)    Angioedema- Patient was on this and Lisinopril  at the same time; had no trouble breathing, but throat became swollen   Lisinopril  Swelling and Other (See Comments)    Angioedema- on this and afinitor  same time; had no trouble breathing, but throat became swollen   Other Other (See Comments)    Certain seafoods cause gout flares   Rosuvastatin  Other (See Comments)    Chills and muscle aches    Statins Other (See Comments)    Joint pain   Simvastatin Other (See Comments)    Joint pain

## 2024-08-17 NOTE — Progress Notes (Signed)
 " PROGRESS NOTE    Richard Navarro  FMW:[id number] DOB: 1946-02-13 DOA: 08/14/2024 PCP: Richard Garnette KIDD, MD   Brief Narrative:  Richard Navarro is a 78 year old male with extensive history of HTN, HLD, Right lung CA, CKD 3b, DM II, depression, GERD, PE (on Eliquis ) chronic iron  deficiency anemia, Gout, Falls, compression fracture and other comorbidites Presenting to ED with complaint of shortness of breath, cough, Tmax at home 101.5. Reports of progressive generalized weaknesses, reduced oral intake, difficulty ambulating. Also had a brief episode of chest pain. Ex-wife reports since his visit to the ED in Thanksgiving he has not been feeling well. He stopped his lung cancer treatment about 10-11 months ago, due to intolerance, low blood count. Being admitted for Sepsis 2/2 to CAP and had Acute on Chronic Respiratory Failure with Hypoxia.   Slowly improving and will continue current treatment. Will resume gentle IVF with NS and anticipating D/C in the next 24 hours.  Assessment and Plan:  CAP (community acquired pneumonia): Left upper lobe per imaging -Broad-spectrum antibiotics as below -Will follow-up with cultures and Tx as below -WBC went from 9.5 -> 6.5 -> 5.2 -CXR as below   History of Pulmonary Embolism: C/w Anticoagulation with Apixaban  5 mg po BID. CT angiogram on admit does not reveal any pulmonary embolism findings   Severe Sepsis (HCC) in the setting of Above: Patient meeting SIRS, early sepsis criteria with Acute respiratory failure likely due to pneumonia, with lactic acidosis, Temp, HR -CT angiogram reviewed, likely left upper lobe pneumonia, right upper lobe changes consistent with chronic cancer  -Per wife at home Tmax 101.5, RR 102, satting 98% on 1 L of oxygen  WBC 11.3, lactic acid 2.8, 2.7 -Initiating IV fluid, antibiotics per sepsis protocol likely source pneumonia -Per sepsis protocol, initiated broad-spectrum antibiotics of cefepime  and vancomycin , will  narrow down antibiotics in next 24-48 hours -Will follow-up with blood cultures -Will try to obtain sputum culture for Gram stain and culture -Tx as Below  -PT/OT recommending Home HHPT/HHOT  Acute on Chronic Respiratory Failure w/ Hypoxia: Has supplemental O2 at Home and uses it PRN and now presenting with continuous need of supplemental oxygen ; Weaning off accordingly -Exacerbated by with underlying lung CA and new post obstructive pneumonia  -Continue IV Antibiotics w/ IV Cefepime  and IV Vancomycin , supplemental oxygen , nebs as below -Weaning off supplemental O2 with a goal of O2 greater 92% -Encouraged spirometer and flutter valve use -Repeat CXR yesterday showed Rounded opacity at the right lung base, unchanged from prior exam. Minimal patchy opacity in the left mid lung, atelectasis versus pneumonia. Emphysema with chronic bronchial thickening. -CXR today showed Improved left lung base atelectasis. Stable rounded right lung base opacity, better assessed on prior CT.Slight increase in right lung base atelectasis.     Normocytic Anemia: Anemia of chronic disease along with iron  deficiency anemia. Hgb/Hct Trend:  Recent Labs  Lab 08/14/24 0936 08/15/24 0514 08/16/24 0625 08/17/24 0624  HGB 12.9* 10.9* 10.4* 10.7*  HCT 39.0 32.8* 30.6* 32.0*  MCV 89.4 90.1 87.9 88.4  -C/w Ferrous Sulfate  325 mg po q48h -Likely has dilutional drop as well with IVF resuscitation. CTM for S/Sx of Bleeding; No overt bleeding noted. Repeat CBC in the AM    Chronic kidney disease (CKD) stage G3b/A1, moderately decreased glomerular filtration rate (GFR) between 30-44 mL/min/1.73 square meter and albuminuria creatinine ratio less than 30 mg/g [facility]): BUN/Cr Trend Recent Labs  Lab 08/14/24 0936 08/15/24 0514 08/16/24 0625 08/17/24 0624  BUN 19 17  15 15  CREATININE 1.56* 1.39* 1.56* 1.40*  -Was getting LR @ 150 mL/hr x20 hours and now stopped but will resume IVF w/ NS @ 75 mL/hr  -Avoid Nephrotoxic  Medications, Contrast Dyes, Hypotension and Dehydration to Ensure Adequate Renal Perfusion and will need to Renally Adjust Meds. CTM & Trend & repeat CMP in the AM   Metabolic Acidosis: Mild. CO2 is 18, Chloride Level is 106, and AG is 14. IVF as above. CTM and Trend and repeat CMP in the AM.   Hyperlipidemia: On review of MAR does not seem to be on a Statin.     Essential Hypertension:  Blood pressure remains stable, early phases of sepsis Reviewed home medications of Amlodipine , Lopressor  and currently held and will resume as able. CTM BP per Protocol Last BP reading was 135/70   Diabetes Mellitus with renal manifestation [facility]): Holding home regimen including Glipizide  5 mg po Daily and Metformin  500 mg po Daily: Recommending reduce dose or discontinuing Metformin  @ D/C due to ongoing CKD. C/w Moderate Novolog  SSI AC. CTM CBGs per Protocol. 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 Patient to will need to follow-up with Dr. Sherrod as outpatient   COPD (chronic obstructive pulmonary disease) [facility]): Currently stable no excessive wheezing no use of accessory muscles; Mild hypoxia needing 1-2 L of oxygen  to maintain O2 sat greater 90%. SpO2: 91 % O2 Flow Rate (L/min): 2 L/min -Start DuoNeb q6h. C/w Guaifenesin  1200 mg po BID, Incentive Spirometry and Flutter Valve -Also add Brovana  and Budesonide ; Hold Stiolto  -C/w Supplemental O2 via [place]   Former Smoker: Has quit few years ago; Currently not relapsed.    GERD (gastroesophageal reflux disease)/ GI Prophylaxis: Continue PPI w/ Pantoprazole  40 mg po BID  Hypoalbuminemia: Patient's Albumin Lvl went from 3.8 -> 3.3 x2 -> 3.2. CTM & Trend &  repeat CMP in the AM   DVT prophylaxis: SCDs Start: 08/14/24 1423 apixaban  (ELIQUIS ) tablet 5 mg    Code Status: Full Code Family Communication: D/w Ex-wife @ bedside  Disposition Plan:  Level of care: Telemetry Status is: Inpatient Remains inpatient appropriate because: Needs further clinical improvement and Anticipate D/C in the next 24 hours    Consultants:  None  Procedures:  None  Antimicrobials:  Anti-infectives (From admission, onward)    Start     Dose/Rate Route Frequency Ordered Stop   08/15/24 1800  vancomycin  (VANCOCIN ) IVPB 1000 mg/200 mL premix        1,000 mg 200 mL/hr over 60 Minutes Intravenous Every 24 hours 08/14/24 1517     08/15/24 0500  ceFEPIme  (MAXIPIME ) 2 g in sodium chloride  0.9 % 100 mL IVPB        2 g 200 mL/hr over 30 Minutes Intravenous Every 12 hours 08/14/24 1517     08/14/24 1445  ceFEPIme  (MAXIPIME ) 2 g in sodium chloride  0.9 % 100 mL IVPB        2 g 200 mL/hr over 30 Minutes Intravenous  Once 08/14/24 1436 08/14/24 1724   08/14/24 1445  vancomycin  (VANCOREADY) IVPB 1500 mg/300 mL        1,500 mg 150 mL/hr over 120 Minutes Intravenous  Once 08/14/24  1436 08/14/24 1925   08/14/24 1430  vancomycin  (VANCOCIN ) IVPB 1000 mg/200 mL premix  Status:  Discontinued        1,000 mg 200 mL/hr over 60 Minutes Intravenous  Once 08/14/24 1426 08/14/24 1436   08/14/24 1430  aztreonam (AZACTAM) 2 g in sodium chloride  0.9 % 100 mL IVPB  Status:  Discontinued        2 g 200 mL/hr over 30 Minutes Intravenous  Once 08/14/24 1426 08/14/24 1436   08/14/24 1015  doxycycline  (VIBRAMYCIN ) 100 mg in sodium chloride  0.9 % 250 mL IVPB        100 mg 125 mL/hr over 120 Minutes Intravenous  Once 08/14/24 1004 08/14/24 1724   08/14/24 1015  cefTRIAXone  (ROCEPHIN ) 1 g in sodium chloride  0.9 % 100 mL IVPB        1 g 200 mL/hr over 30 Minutes Intravenous  Once 08/14/24 1004 08/14/24 1110       Subjective: Seen and examined at bedside he is not feeling well today.   Thinks his breathing is doing better but says he was more confused and felt a little weaker.  No lightheadedness or dizziness.  Denies any chest pain or lightheadedness or dizziness.  Objective: Vitals:   08/16/24 2208 08/17/24 0901 08/17/24 0903 08/17/24 1200  BP: (!) 153/78   (!) 155/71  Pulse: 95   70  Resp:    18  Temp:    97.9 F (36.6 C)  TempSrc:    Oral  SpO2:  (!) 89% 91%   Weight:      Height:        Intake/Output Summary (Last 24 hours) at 08/17/2024 2131 Last data filed at 08/17/2024 1801 Gross per 24 hour  Intake 1336.25 ml  Output 1050 ml  Net 286.25 ml   Filed Weights   08/14/24 1507 08/16/24 0500  Weight: 72.8 kg 73.8 kg   Examination: Physical Exam:  Constitutional: Elderly chronically ill-appearing African-American male in no acute distress Respiratory: Diminished to auscultation bilaterally with some coarse breath sounds, no wheezing, rales, rhonchi or crackles. Normal respiratory effort and patient is not tachypenic. No accessory muscle use.  Right supplemental oxygen  via nasal cannula Cardiovascular: RRR, no murmurs / rubs / gallops. S1 and S2 auscultated.  Trace extremity edema.  Abdomen: Soft, non-tender, non-distended. Bowel sounds positive.  GU: Deferred. Musculoskeletal: No clubbing / cyanosis of digits/nails. No joint deformity upper and lower extremities.  Skin: No rashes, lesions, ulcers on a limited skin evaluation. No induration; Warm and dry.  Neurologic: CN 2-12 grossly intact with no focal deficits. Romberg sign and cerebellar reflexes not assessed.  Psychiatric: Normal judgment and insight. Alert and oriented x 3. Normal mood and appropriate affect.   Data Reviewed: I have personally reviewed following labs and imaging studies  CBC: Recent Labs  Lab 08/14/24 0936 08/15/24 0514 08/16/24 0625 08/17/24 0624  WBC 11.3* 9.5 6.5 5.2  NEUTROABS 10.4*  --  4.9 4.0  HGB 12.9* 10.9* 10.4* 10.7*  HCT 39.0 32.8* 30.6* 32.0*  MCV 89.4 90.1  87.9 88.4  PLT 176 147* 133* 161   Basic Metabolic Panel: Recent Labs  Lab 08/14/24 0936 08/14/24 1805 08/15/24 0514 08/16/24 0625 08/17/24 0624  NA 135  --  139 141 138  K 4.1  --  3.8 3.8 3.8  CL 102  --  106 108 106  CO2 20*  --  21* 22 18*  GLUCOSE 202*  --  112* 122* 123*  BUN 19  --  17 15 15   CREATININE 1.56*  --  1.39* 1.56* 1.40*  CALCIUM  9.4  --  8.8* 8.9 8.8*  MG  --  1.9 1.8 2.2 2.2  PHOS  --  2.0* 1.6* 2.7 2.8   GFR: Estimated Creatinine Clearance: 43.9 mL/min (A) (by C-G formula based on SCr of 1.4 mg/dL (H)). Liver Function Tests: Recent Labs  Lab 08/14/24 0936 08/15/24 0514 08/16/24 0625 08/17/24 0624  AST 22 26 16 18   ALT 8 6 10 9   ALKPHOS 112 115 85 81  BILITOT 0.5 0.4 0.5 0.4  PROT 7.6 6.5 6.8 6.8  ALBUMIN 3.8 3.3* 3.3* 3.2*   No results for input(s): LIPASE, AMYLASE in the last 168 hours. No results for input(s): AMMONIA in the last 168 hours. Coagulation Profile: Recent Labs  Lab 08/14/24 0936  INR 1.3*   Cardiac Enzymes: No results for input(s): CKTOTAL, CKMB, CKMBINDEX, TROPONINI in the last 168 hours. BNP (last 3 results) Recent Labs    08/14/24 1805  PROBNP 1,080.0*   HbA1C: No results for input(s): HGBA1C in the last 72 hours. CBG: Recent Labs  Lab 08/16/24 1706 08/16/24 2112 08/17/24 0746 08/17/24 1156 08/17/24 1641  GLUCAP 124* 191* 114* 115* 103*   Lipid Profile: No results for input(s): CHOL, HDL, LDLCALC, TRIG, CHOLHDL, LDLDIRECT in the last 72 hours. Thyroid  Function Tests: No results for input(s): TSH, T4TOTAL, FREET4, T3FREE, THYROIDAB in the last 72 hours. Anemia Panel: No results for input(s): VITAMINB12, FOLATE, FERRITIN, TIBC, IRON , RETICCTPCT in the last 72 hours. Sepsis Labs: Recent Labs  Lab 08/14/24 0947 08/14/24 1207 08/14/24 1805 08/15/24 0514  PROCALCITON  --   --  0.60  --   LATICACIDVEN 2.8* 2.7* 3.7* 1.5   Recent Results (from the past 240  hours)  Culture, blood (Routine x 2)     Status: None (Preliminary result)   Collection Time: 08/14/24  9:36 AM   Specimen: BLOOD RIGHT ARM  Result Value Ref Range Status   Specimen Description   Final    BLOOD RIGHT ARM Performed at [facility], [address] [place], [place] [address]    Special Requests   Final    BOTTLES DRAWN AEROBIC AND ANAEROBIC Blood Culture results may not be optimal due to an inadequate volume of blood received in culture bottles Performed at [facility], [address] [place], [place] [address]    Culture   Final    NO GROWTH 3 DAYS Performed at [facility], [address], [place], [place] [address]    Report Status PENDING  Incomplete  Resp panel by RT-PCR (RSV, Flu A&B, Covid)     Status: None   Collection Time: 08/14/24 10:03 AM   Specimen: Nasal Swab  Result Value Ref Range Status   SARS Coronavirus 2 by RT PCR NEGATIVE NEGATIVE Final    Comment: (NOTE) SARS-CoV-2 target nucleic acids are NOT DETECTED.  The SARS-CoV-2 RNA is generally detectable in upper respiratory specimens during the acute phase of infection. The lowest concentration of SARS-CoV-2 viral copies this assay can detect is 138 copies/mL. A negative result does not preclude SARS-Cov-2 infection and should not be used as the sole basis for treatment or other patient management decisions. A negative result may occur with  improper specimen collection/handling, submission of specimen other than nasopharyngeal swab, presence of viral mutation(s) within the areas targeted by this assay, and inadequate number of viral copies(<138 copies/mL). A negative result must be combined with clinical  observations, patient history, and epidemiological information. The expected result is Negative.  Fact Sheet for Patients:  bloggercourse.com  Fact Sheet for Healthcare Providers:  seriousbroker.it  This test  is no t yet approved or cleared by the [place]  FDA and  has been authorized for detection and/or diagnosis of SARS-CoV-2 by FDA under an Emergency Use Authorization (EUA). This EUA will remain  in effect (meaning this test can be used) for the duration of the COVID-19 declaration under Section 564(b)(1) of the Act, 21 U.S.C.section 360bbb-3(b)(1), unless the authorization is terminated  or revoked sooner.       Influenza A by PCR NEGATIVE NEGATIVE Final   Influenza B by PCR NEGATIVE NEGATIVE Final    Comment: (NOTE) The Xpert Xpress SARS-CoV-2/FLU/RSV plus assay is intended as an aid in the diagnosis of influenza from Nasopharyngeal swab specimens and should not be used as a sole basis for treatment. Nasal washings and aspirates are unacceptable for Xpert Xpress SARS-CoV-2/FLU/RSV testing.  Fact Sheet for Patients: bloggercourse.com  Fact Sheet for Healthcare Providers: seriousbroker.it  This test is not yet approved or cleared by the [place]  FDA and has been authorized for detection and/or diagnosis of SARS-CoV-2 by FDA under an Emergency Use Authorization (EUA). This EUA will remain in effect (meaning this test can be used) for the duration of the COVID-19 declaration under Section 564(b)(1) of the Act, 21 U.S.C. section 360bbb-3(b)(1), unless the authorization is terminated or revoked.     Resp Syncytial Virus by PCR NEGATIVE NEGATIVE Final    Comment: (NOTE) Fact Sheet for Patients: bloggercourse.com  Fact Sheet for Healthcare Providers: seriousbroker.it  This test is not yet approved or cleared by the [place]  FDA and has been authorized for detection and/or diagnosis of SARS-CoV-2 by FDA under an Emergency Use Authorization (EUA). This EUA will remain in effect (meaning this test can be used) for the duration of the COVID-19 declaration under Section  564(b)(1) of the Act, 21 U.S.C. section 360bbb-3(b)(1), unless the authorization is terminated or revoked.  Performed at [facility], [address] [place], [place] [address]   Culture, blood (Routine x 2)     Status: None (Preliminary result)   Collection Time: 08/14/24 10:13 AM   Specimen: BLOOD  Result Value Ref Range Status   Specimen Description   Final    BLOOD RIGHT ANTECUBITAL Performed at [facility], [address], [place], [place] [address]    Special Requests   Final    BOTTLES DRAWN AEROBIC AND ANAEROBIC Blood Culture adequate volume Performed at [facility], [address] [place], [place] [address]    Culture   Final    NO GROWTH 3 DAYS Performed at [facility], [address], [place], [place] [address]    Report Status PENDING  Incomplete  MRSA Next Gen by PCR, Nasal     Status: None   Collection Time: 08/15/24 12:51 PM   Specimen: Nasal Mucosa  Result Value Ref Range Status   MRSA by PCR Next Gen NOT DETECTED NOT DETECTED Final    Comment: (NOTE) The GeneXpert MRSA Assay (FDA approved for NASAL specimens only), is one component of a comprehensive MRSA colonization surveillance program. It is not intended to diagnose MRSA infection nor to guide or monitor treatment for MRSA infections. Test performance is not FDA approved in patients less than 87 years old. Performed at [facility], [address], [place], [place] [address]  Respiratory (~20 pathogens) panel by PCR     Status: None   Collection Time: 08/15/24 12:52 PM   Specimen: Nasopharyngeal Swab; Respiratory  Result Value Ref Range Status   Adenovirus NOT DETECTED NOT DETECTED Final   Coronavirus 229E NOT DETECTED NOT DETECTED Final    Comment: (NOTE) The Coronavirus on the Respiratory Panel, DOES NOT test for the novel  Coronavirus (2019 nCoV)    Coronavirus HKU1 NOT DETECTED NOT DETECTED Final    Coronavirus NL63 NOT DETECTED NOT DETECTED Final   Coronavirus OC43 NOT DETECTED NOT DETECTED Final   Metapneumovirus NOT DETECTED NOT DETECTED Final   Rhinovirus / Enterovirus NOT DETECTED NOT DETECTED Final   Influenza A NOT DETECTED NOT DETECTED Final   Influenza B NOT DETECTED NOT DETECTED Final   Parainfluenza Virus 1 NOT DETECTED NOT DETECTED Final   Parainfluenza Virus 2 NOT DETECTED NOT DETECTED Final   Parainfluenza Virus 3 NOT DETECTED NOT DETECTED Final   Parainfluenza Virus 4 NOT DETECTED NOT DETECTED Final   Respiratory Syncytial Virus NOT DETECTED NOT DETECTED Final   Bordetella pertussis NOT DETECTED NOT DETECTED Final   Bordetella Parapertussis NOT DETECTED NOT DETECTED Final   Chlamydophila pneumoniae NOT DETECTED NOT DETECTED Final   Mycoplasma pneumoniae NOT DETECTED NOT DETECTED Final    Comment: Performed at [facility], [address] [place], [place] [address]    Radiology Studies: DG CHEST PORT 1 VIEW Result Date: 08/17/2024 CLINICAL DATA:  Shortness of breath. EXAM: PORTABLE CHEST 1 VIEW COMPARISON:  Radiograph yesterday, CT 08/14/2024 FINDINGS: Improved left lung base atelectasis. Stable rounded right lung base opacity with slight increase in right lung base atelectasis. Redemonstrated emphysematous changes and bronchial thickening. Stable heart size and mediastinal contours. No pneumothorax or large pleural effusion. IMPRESSION: 1. Improved left lung base atelectasis. 2. Stable rounded right lung base opacity, better assessed on prior CT. 3. Slight increase in right lung base atelectasis. Electronically Signed   By: Andrea Gasman M.D.   On: 08/17/2024 10:48   DG CHEST PORT 1 VIEW Result Date: 08/16/2024 CLINICAL DATA:  Shortness of breath. EXAM: PORTABLE CHEST 1 VIEW COMPARISON:  08/14/2024 radiographs and CT FINDINGS: The heart is normal in size. Mediastinal contours are unchanged. Again seen emphysema with diffuse bronchial thickening. Rounded opacity  at the right lung base, unchanged. Minimal patchy opacity in the left mid lung. Small right pleural effusion on CT is not well demonstrated by radiograph. No pneumothorax. IMPRESSION: 1. Rounded opacity at the right lung base, unchanged from prior exam. 2. Minimal patchy opacity in the left mid lung, atelectasis versus pneumonia. 3. Emphysema with chronic bronchial thickening. Electronically Signed   By: Andrea Gasman M.D.   On: 08/16/2024 12:22   Scheduled Meds:  allopurinol   100 mg Oral Daily   amLODipine   5 mg Oral QPC supper   apixaban   5 mg Oral BID   arformoterol   15 mcg Nebulization BID   budesonide  (PULMICORT ) nebulizer solution  0.25 mg Nebulization BID   docusate sodium   100 mg Oral Daily   ferrous sulfate   325 mg Oral Q48H   guaiFENesin   1,200 mg Oral BID   insulin  aspart  0-15 Units Subcutaneous TID WC   metoprolol  tartrate  25 mg Oral BID   pantoprazole   40 mg Oral BID   polyethylene glycol  17 g Oral Daily   sodium bicarbonate   650 mg Oral BID   sodium chloride  flush  3 mL Intravenous Q12H   sodium chloride  flush  3 mL Intravenous Q12H   Continuous Infusions:  sodium chloride  75 mL/hr at 08/17/24 1027   ceFEPime  (MAXIPIME ) IV 2 g (08/17/24 1031)   lactated ringers      vancomycin  1,000 mg (08/17/24 1730)    LOS: 2 days   Alejandro Marker, DO [facility] Available via Epic secure chat 7am-7pm After these hours, please refer to coverage provider listed on [email or web address] 08/17/2024, 9:31 PM  "

## 2024-08-18 ENCOUNTER — Other Ambulatory Visit: Payer: Self-pay | Admitting: Family Medicine

## 2024-08-18 ENCOUNTER — Inpatient Hospital Stay (HOSPITAL_COMMUNITY)

## 2024-08-18 DIAGNOSIS — F4310 Post-traumatic stress disorder, unspecified: Secondary | ICD-10-CM | POA: Diagnosis not present

## 2024-08-18 DIAGNOSIS — E1122 Type 2 diabetes mellitus with diabetic chronic kidney disease: Secondary | ICD-10-CM | POA: Diagnosis not present

## 2024-08-18 DIAGNOSIS — J9611 Chronic respiratory failure with hypoxia: Secondary | ICD-10-CM | POA: Diagnosis not present

## 2024-08-18 DIAGNOSIS — Z87891 Personal history of nicotine dependence: Secondary | ICD-10-CM | POA: Diagnosis not present

## 2024-08-18 DIAGNOSIS — F32A Depression, unspecified: Secondary | ICD-10-CM

## 2024-08-18 DIAGNOSIS — A419 Sepsis, unspecified organism: Secondary | ICD-10-CM | POA: Diagnosis not present

## 2024-08-18 DIAGNOSIS — N1832 Chronic kidney disease, stage 3b: Secondary | ICD-10-CM | POA: Diagnosis not present

## 2024-08-18 DIAGNOSIS — J189 Pneumonia, unspecified organism: Secondary | ICD-10-CM | POA: Diagnosis not present

## 2024-08-18 DIAGNOSIS — K219 Gastro-esophageal reflux disease without esophagitis: Secondary | ICD-10-CM | POA: Diagnosis not present

## 2024-08-18 DIAGNOSIS — F419 Anxiety disorder, unspecified: Secondary | ICD-10-CM

## 2024-08-18 DIAGNOSIS — J9601 Acute respiratory failure with hypoxia: Secondary | ICD-10-CM | POA: Diagnosis not present

## 2024-08-18 DIAGNOSIS — I10 Essential (primary) hypertension: Secondary | ICD-10-CM | POA: Diagnosis not present

## 2024-08-18 DIAGNOSIS — C3431 Malignant neoplasm of lower lobe, right bronchus or lung: Secondary | ICD-10-CM | POA: Diagnosis not present

## 2024-08-18 LAB — COMPREHENSIVE METABOLIC PANEL WITH GFR
ALT: 10 U/L (ref 0–44)
AST: 18 U/L (ref 15–41)
Albumin: 3.2 g/dL — ABNORMAL LOW (ref 3.5–5.0)
Alkaline Phosphatase: 83 U/L (ref 38–126)
Anion gap: 12 (ref 5–15)
BUN: 14 mg/dL (ref 8–23)
CO2: 21 mmol/L — ABNORMAL LOW (ref 22–32)
Calcium: 8.9 mg/dL (ref 8.9–10.3)
Chloride: 106 mmol/L (ref 98–111)
Creatinine, Ser: 1.46 mg/dL — ABNORMAL HIGH (ref 0.61–1.24)
GFR, Estimated: 48 mL/min — ABNORMAL LOW
Glucose, Bld: 120 mg/dL — ABNORMAL HIGH (ref 70–99)
Potassium: 3.6 mmol/L (ref 3.5–5.1)
Sodium: 139 mmol/L (ref 135–145)
Total Bilirubin: 0.3 mg/dL (ref 0.0–1.2)
Total Protein: 6.7 g/dL (ref 6.5–8.1)

## 2024-08-18 LAB — CBC WITH DIFFERENTIAL/PLATELET
Abs Immature Granulocytes: 0.02 K/uL (ref 0.00–0.07)
Basophils Absolute: 0 K/uL (ref 0.0–0.1)
Basophils Relative: 1 %
Eosinophils Absolute: 0.1 K/uL (ref 0.0–0.5)
Eosinophils Relative: 3 %
HCT: 31.9 % — ABNORMAL LOW (ref 39.0–52.0)
Hemoglobin: 10.5 g/dL — ABNORMAL LOW (ref 13.0–17.0)
Immature Granulocytes: 0 %
Lymphocytes Relative: 12 %
Lymphs Abs: 0.6 K/uL — ABNORMAL LOW (ref 0.7–4.0)
MCH: 29.5 pg (ref 26.0–34.0)
MCHC: 32.9 g/dL (ref 30.0–36.0)
MCV: 89.6 fL (ref 80.0–100.0)
Monocytes Absolute: 0.4 K/uL (ref 0.1–1.0)
Monocytes Relative: 8 %
Neutro Abs: 3.9 K/uL (ref 1.7–7.7)
Neutrophils Relative %: 76 %
Platelets: 172 K/uL (ref 150–400)
RBC: 3.56 MIL/uL — ABNORMAL LOW (ref 4.22–5.81)
RDW: 15.1 % (ref 11.5–15.5)
WBC: 5.1 K/uL (ref 4.0–10.5)
nRBC: 0 % (ref 0.0–0.2)

## 2024-08-18 LAB — GLUCOSE, CAPILLARY
Glucose-Capillary: 108 mg/dL — ABNORMAL HIGH (ref 70–99)
Glucose-Capillary: 151 mg/dL — ABNORMAL HIGH (ref 70–99)
Glucose-Capillary: 95 mg/dL (ref 70–99)
Glucose-Capillary: 105 mg/dL — ABNORMAL HIGH (ref 70–99)

## 2024-08-18 LAB — MAGNESIUM: Magnesium: 2.2 mg/dL (ref 1.7–2.4)

## 2024-08-18 LAB — PHOSPHORUS: Phosphorus: 2.6 mg/dL (ref 2.5–4.6)

## 2024-08-18 NOTE — Progress Notes (Signed)
 Physical Therapy Treatment Patient Details Name: Richard Navarro MRN: [id number] DOB: 1946-01-20 Today's Date: 08/18/2024   History of Present Illness 78 year old male Presenting to ED with complaint of shortness of breath, cough. Dx of PNA. Pt with extensive history of HTN, HLD, Right lung CA, CKD 3b, DM II, depression, GERD, PE (on Eliquis ) chronic iron  deficiency anemia, Gout, Falls, compression fracture.    PT Comments  Pt able to amb incr distance ~ 240' with RW, 2 brief standing rests d/t dyspnea. SpO2=92-99% on RA. Education provided on proper breathing and posture.  Plan for HHPT remains appropriate.     If plan is discharge home, recommend the following: A little help with bathing/dressing/bathroom;Assistance with cooking/housework;Assist for transportation;Help with stairs or ramp for entrance   Can travel by private vehicle        Equipment Recommendations  None recommended by PT    Recommendations for Other Services       Precautions / Restrictions Precautions Precautions: Fall Recall of Precautions/Restrictions: Intact Precaution/Restrictions Comments: 2 falls in past 6 months, monitor sats Restrictions Weight Bearing Restrictions Per Provider Order: No     Mobility  Bed Mobility Overal bed mobility: Modified Independent                  Transfers Overall transfer level: Needs assistance Equipment used: Rolling walker (2 wheels), None Transfers: Sit to/from Stand Sit to Stand: Supervision, Contact guard assist           General transfer comment: unsteady on initial standing.  agreed to [facility] after unsteadiness. cues for hand placement    Ambulation/Gait Ambulation/Gait assistance: Contact guard assist, Supervision Gait Distance (Feet): 240 Feet Assistive device: Rolling walker (2 wheels) Gait Pattern/deviations: Step-through pattern, Trunk flexed Gait velocity: decr but functional     General Gait Details: cues for RW position, trunk  extension, proper breathing. SpO2=92-100% on RA. 2 brief standing rests   Comptroller Bed    Modified Rankin (Stroke Patients Only)       Balance                                            Communication Communication Communication: No apparent difficulties Factors Affecting Communication: Hearing impaired  Cognition Arousal: Alert Behavior During Therapy: WFL for tasks assessed/performed   PT - Cognitive impairments: No apparent impairments                         Following commands: Intact      Cueing Cueing Techniques: Verbal cues  Exercises      General Comments        Pertinent Vitals/Pain Pain Assessment Pain Assessment: No/denies pain    Home Living                          Prior Function            PT Goals (current goals can now be found in the care plan section) Acute Rehab PT Goals Patient Stated Goal: go home PT Goal Formulation: With patient Time For Goal Achievement: 08/29/24 Potential to Achieve Goals: Good Progress towards PT goals: Progressing toward goals    Frequency    Min 3X/week  PT Plan      Co-evaluation              AM-PAC PT 6 Clicks Mobility   Outcome Measure  Help needed turning from your back to your side while in a flat bed without using bedrails?: None Help needed moving from lying on your back to sitting on the side of a flat bed without using bedrails?: A Little Help needed moving to and from a bed to a chair (including a wheelchair)?: A Little Help needed standing up from a chair using your arms (e.g., wheelchair or bedside chair)?: A Little Help needed to walk in hospital room?: A Little Help needed climbing 3-5 steps with a railing? : A Little 6 Click Score: 19    End of Session Equipment Utilized During Treatment: Gait belt Activity Tolerance: Patient tolerated treatment well Patient left: in bed;with call  bell/phone within reach;with bed alarm set Nurse Communication: Mobility status;Other (comment) (O2 sats) PT Visit Diagnosis: Difficulty in walking, not elsewhere classified (R26.2);History of falling (Z91.81)     Time: 8466-8397 PT Time Calculation (min) (ACUTE ONLY): 29 min  Charges:    $Gait Training: 23-37 mins PT General Charges $$ ACUTE PT VISIT: 1 Visit                     Kyria Bumgardner, PT  Acute Rehab Dept ([facility]) [phone]  08/18/2024    [facility] 08/18/2024, 4:07 PM

## 2024-08-18 NOTE — Plan of Care (Signed)
" °  Problem: Respiratory: Goal: Ability to maintain adequate ventilation will improve Outcome: Progressing   Problem: Education: Goal: Ability to describe self-care measures that may prevent or decrease complications (Diabetes Survival Skills Education) will improve Outcome: Progressing   Problem: Coping: Goal: Ability to adjust to condition or change in health will improve Outcome: Progressing   Problem: Fluid Volume: Goal: Ability to maintain a balanced intake and output will improve Outcome: Progressing   Problem: Health Behavior/Discharge Planning: Goal: Ability to identify and utilize available resources and services will improve Outcome: Progressing   Problem: Metabolic: Goal: Ability to maintain appropriate glucose levels will improve Outcome: Progressing   Problem: Skin Integrity: Goal: Risk for impaired skin integrity will decrease Outcome: Progressing   Problem: Tissue Perfusion: Goal: Adequacy of tissue perfusion will improve Outcome: Progressing   Problem: Education: Goal: Knowledge of General Education information will improve Description: Including pain rating scale, medication(s)/side effects and non-pharmacologic comfort measures Outcome: Progressing   Problem: Health Behavior/Discharge Planning: Goal: Ability to manage health-related needs will improve Outcome: Progressing   Problem: Clinical Measurements: Goal: Ability to maintain clinical measurements within normal limits will improve Outcome: Progressing Goal: Respiratory complications will improve Outcome: Progressing   Problem: Activity: Goal: Risk for activity intolerance will decrease Outcome: Progressing   Problem: Nutrition: Goal: Adequate nutrition will be maintained Outcome: Progressing   Problem: Coping: Goal: Level of anxiety will decrease Outcome: Progressing   Problem: Elimination: Goal: Will not experience complications related to bowel motility Outcome: Progressing Goal:  Will not experience complications related to urinary retention Outcome: Progressing   Problem: Pain Managment: Goal: General experience of comfort will improve and/or be controlled Outcome: Progressing   Problem: Safety: Goal: Ability to remain free from injury will improve Outcome: Progressing   Problem: Skin Integrity: Goal: Risk for impaired skin integrity will decrease Outcome: Progressing   "

## 2024-08-18 NOTE — Progress Notes (Signed)
 Mobility Specialist Progress Note:  [place] 2 L During mobility: 93% SpO2 Post-mobility: 95% SPO2   08/18/24 1112  Mobility  Activity Stood at bedside (Chair Exercises)  Level of Assistance Contact guard assist, steadying assist  Assistive Device Front wheel walker  Activity Response Tolerated fair  Mobility Referral Yes  Mobility visit 1 Mobility  Mobility Specialist Start Time (ACUTE ONLY) 0930  Mobility Specialist Stop Time (ACUTE ONLY) [id number]  Mobility Specialist Time Calculation (min) (ACUTE ONLY) 12 min   Pt was received in recliner and agreed to mobility. CGA sit to stand, Pt stated feeling slightly lightheaded, instructed to sit as lightheadedness subsided. Opted for chair exercises: Seated BLE Exercises:   1) Knee Extension: 1 x 5  2) Marching: 1 x 5  Pt had no complaints/issues during session. Returned to recliner with all needs met. Call bell in reach and chair alarm on.  [employer] - Mobility Specialist

## 2024-08-18 NOTE — Progress Notes (Signed)
 " PROGRESS NOTE    PERKINS MOLINA  FMW:[id number] DOB: [birth year] DOA: 08/14/2024 PCP: Katrinka Garnette KIDD, MD   Brief Narrative:  Richard Navarro is a 78 year old male with extensive history of HTN, HLD, Right lung CA, CKD 3b, DM II, depression, GERD, PE (on Eliquis ) chronic iron  deficiency anemia, Gout, Falls, compression fracture and other comorbidites Presenting to ED with complaint of shortness of breath, cough, Tmax at home 101.5. Reports of progressive generalized weaknesses, reduced oral intake, difficulty ambulating. Also had a brief episode of chest pain. Ex-wife reports since his visit to the ED in Thanksgiving he has not been feeling well. He stopped his lung cancer treatment about 10-11 months ago, due to intolerance, low blood count. Being admitted for Sepsis 2/2 to CAP and had Acute on Chronic Respiratory Failure with Hypoxia.   Slowly improving and will continue current treatment. Will resume gentle IVF with NS. He is improving but having quite a bit of PTSD so consulted Psych for further evaluation. Anticipate D/C in the next 24 hours as his O2 is weaning.   Assessment and Plan:  CAP (community acquired pneumonia): Left upper lobe per imaging -Broad-spectrum antibiotics as below to be de-escalated -Will follow-up with cultures and Tx as below -WBC went from 9.5 -> 6.5 -> 5.2 ->5.1 -CXR as below   History of Pulmonary Embolism: C/w Anticoagulation with Apixaban  5 mg po BID. CT angiogram on admit does not reveal any pulmonary embolism findings   Severe Sepsis (HCC) in the setting of Above: Patient meeting SIRS, early sepsis criteria with Acute respiratory failure likely due to pneumonia, with lactic acidosis, Temp, HR -CT angiogram reviewed, likely left upper lobe pneumonia, right upper lobe changes consistent with chronic cancer  -Per wife at home Tmax 101.5, RR 102, satting 98% on 1 L of oxygen  WBC 11.3, lactic acid 2.8, 2.7 -Initiating IV fluid, antibiotics per sepsis  protocol likely source pneumonia -Per sepsis protocol, initiated broad-spectrum antibiotics of cefepime  and vancomycin  but will narrow down Abx to Ceftriaxone  and Doxycycline .  -Will follow-up with blood cultures -Will try to obtain sputum culture for Gram stain and culture -Tx as Below  -PT/OT recommending Home HHPT/HHOT and feel this plan remains appropriate  Acute on Chronic Respiratory Failure w/ Hypoxia: Has supplemental O2 at Home and uses it PRN and now presenting with continuous need of supplemental oxygen ; Weaning off accordingly -Exacerbated by with underlying lung CA and new post obstructive pneumonia  -Continue IV Antibiotics w/ IV Cefepime  and IV Vancomycin , supplemental oxygen , nebs as below -Weaning off supplemental O2 with a goal of O2 greater 92%; Able to be weaned and SpO2 was 92-99% on RA SpO2: 95 % O2 Flow Rate (L/min): 2 L/min -Encouraged spirometer and flutter valve use -CXR done and showed Progressive opacity within the right lung base, possibly reflecting postobstructive pneumonitis in the setting of a stable medial right lung base mass. Small right pleural effusion.     Normocytic Anemia: Anemia of chronic disease along with iron  deficiency anemia. Hgb/Hct Trend:  Recent Labs  Lab 08/14/24 0936 08/15/24 0514 08/16/24 0625 08/17/24 0624 08/18/24 0541  HGB 12.9* 10.9* 10.4* 10.7* 10.5*  HCT 39.0 32.8* 30.6* 32.0* 31.9*  MCV 89.4 90.1 87.9 88.4 89.6  -C/w Ferrous Sulfate  325 mg po q48h -Likely has dilutional drop as well with IVF resuscitation. CTM for S/Sx of Bleeding; No overt bleeding noted. Repeat CBC in the AM    Chronic kidney disease (CKD) stage G3b/A1, moderately decreased glomerular filtration rate (GFR) between  30-44 mL/min/1.73 square meter and albuminuria creatinine ratio less than 30 mg/g [facility]): BUN/Cr Trend Recent Labs  Lab 08/14/24 0936 08/15/24 0514 08/16/24 0625 08/17/24 0624 08/18/24 0541  BUN 19 17 15 15 14   CREATININE 1.56* 1.39*  1.56* 1.40* 1.46*  -Stopping IVF hydration today.  -Avoid Nephrotoxic Medications, Contrast Dyes, Hypotension and Dehydration to Ensure Adequate Renal Perfusion and will need to Renally Adjust Meds. CTM & Trend & repeat CMP in the AM   Metabolic Acidosis: Mild and improving. CO2 is now 21, Chloride Level is 106, and AG is 12. IVF as above and will stop. CTM and Trend and repeat CMP in the AM.   Hyperlipidemia: On review of MAR does not seem to be on a Statin.     Essential Hypertension:  Blood pressure remains stable, early phases of sepsis Reviewed home medications of Amlodipine , Lopressor  and currently held and will resume as able. CTM BP per Protocol Last BP reading was 140/78   Diabetes Mellitus with renal manifestation [facility]): Holding home regimen including Glipizide  5 mg po Daily and Metformin  500 mg po Daily: Recommending reduce dose or discontinuing Metformin  @ D/C due to ongoing CKD. C/w Moderate Novolog  SSI AC. CTM CBGs per Protocol. 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 Patient to will need to follow-up with Dr. Sherrod as outpatient   PTSD and GAD: Having a lot of flashbacks from his time in [place] and being exposed to [employer]. Psych consulted for further evaluation  COPD (Chronic Obstructive Pulmonary Disease) (HCC): Currently stable no excessive wheezing no use of accessory muscles; Mild hypoxia needing 1-2 L of oxygen  to maintain O2 sat greater 90%. SpO2: 95 % O2 Flow Rate (L/min): 2 L/min -Start DuoNeb q6h. C/w Guaifenesin  1200 mg po BID, Incentive Spirometry and Flutter Valve -Also add Brovana  and Budesonide ; Hold Stiolto  -C/w Supplemental O2 via [place]   Former  Smoker: Has quit few years ago; Currently not relapsed.    GERD (gastroesophageal reflux disease)/ GI Prophylaxis: Continue PPI w/ Pantoprazole  40 mg po BID  Hypoalbuminemia: Patient's Albumin Lvl went from 3.8 -> 3.3 x2 -> 3.2 x2. CTM & Trend & repeat CMP in the AM   DVT prophylaxis: SCDs Start: 08/14/24 1423 apixaban  (ELIQUIS ) tablet 5 mg    Code Status: Full Code Family Communication: D/w Wife @ bedside  Disposition Plan:  Level of care: Telemetry Status is: Inpatient Remains inpatient appropriate because: Needs further clinical improvement and evaluation by Psych   Consultants:  Psychiatry  Procedures:  As delineated as above  Antimicrobials:  Anti-infectives (From admission, onward)    Start     Dose/Rate Route Frequency Ordered Stop   08/17/24 2230  doxycycline  (VIBRA -TABS) tablet 100 mg        100 mg Oral Every 12 hours 08/17/24 2135     08/17/24 2230  cefTRIAXone  (ROCEPHIN ) 1 g in sodium chloride  0.9 % 100 mL IVPB        1 g 200 mL/hr over 30 Minutes Intravenous Every 24 hours 08/17/24 2135     08/15/24 1800  vancomycin  (VANCOCIN ) IVPB 1000 mg/200 mL premix  Status:  Discontinued        1,000 mg  200 mL/hr over 60 Minutes Intravenous Every 24 hours 08/14/24 1517 08/17/24 2135   08/15/24 0500  ceFEPIme  (MAXIPIME ) 2 g in sodium chloride  0.9 % 100 mL IVPB  Status:  Discontinued        2 g 200 mL/hr over 30 Minutes Intravenous Every 12 hours 08/14/24 1517 08/17/24 2135   08/14/24 1445  ceFEPIme  (MAXIPIME ) 2 g in sodium chloride  0.9 % 100 mL IVPB        2 g 200 mL/hr over 30 Minutes Intravenous  Once 08/14/24 1436 08/14/24 1724   08/14/24 1445  vancomycin  (VANCOREADY) IVPB 1500 mg/300 mL        1,500 mg 150 mL/hr over 120 Minutes Intravenous  Once 08/14/24 1436 08/14/24 1925   08/14/24 1430  vancomycin  (VANCOCIN ) IVPB 1000 mg/200 mL premix  Status:  Discontinued        1,000 mg 200 mL/hr over 60 Minutes Intravenous  Once 08/14/24 1426 08/14/24 1436   08/14/24 1430   aztreonam (AZACTAM) 2 g in sodium chloride  0.9 % 100 mL IVPB  Status:  Discontinued        2 g 200 mL/hr over 30 Minutes Intravenous  Once 08/14/24 1426 08/14/24 1436   08/14/24 1015  doxycycline  (VIBRAMYCIN ) 100 mg in sodium chloride  0.9 % 250 mL IVPB        100 mg 125 mL/hr over 120 Minutes Intravenous  Once 08/14/24 1004 08/14/24 1724   08/14/24 1015  cefTRIAXone  (ROCEPHIN ) 1 g in sodium chloride  0.9 % 100 mL IVPB        1 g 200 mL/hr over 30 Minutes Intravenous  Once 08/14/24 1004 08/14/24 1110      Subjective: Seen and examined at bedside and he is very anxious and having flashbacks from his time in [place].  Respiratory status is improving.  He denies any lightheadedness or dizziness.  No other concerns or complaints at this time.  Objective: Vitals:   08/17/24 2159 08/17/24 2249 08/18/24 0544 08/18/24 0909  BP: (!) 152/71  111/78 (!) 140/78  Pulse: 77  75 70  Resp: 18  18 16   Temp: 97.8 F (36.6 C)  97.6 F (36.4 C)   TempSrc: Oral     SpO2:  95% 95%   Weight:   73.8 kg   Height:        Intake/Output Summary (Last 24 hours) at 08/18/2024 2107 Last data filed at 08/18/2024 1930 Gross per 24 hour  Intake 1626.74 ml  Output 2025 ml  Net -398.26 ml   Filed Weights   08/14/24 1507 08/16/24 0500 08/18/24 0544  Weight: 72.8 kg 73.8 kg 73.8 kg   Examination: Physical Exam:  Constitutional: Thin elderly African-American male who appears very anxious and appears depressed Respiratory: Diminished to auscultation bilaterally with some coarse breath sounds and has some slight rhonchi some mild crackles but no appreciable wheezing or rales.  Has a normal respiratory effort and is not tachypneic but is wearing supplemental oxygen  nasal cannula Cardiovascular: RRR, no murmurs / rubs / gallops. S1 and S2 auscultated. No extremity edema. .  Abdomen: Soft, non-tender, non-distended. Bowel sounds positive.  GU: Deferred. Musculoskeletal: No clubbing / cyanosis of digits/nails. No  joint deformity upper and lower extremities.  Skin: No rashes, lesions, ulcers on a limited skin evaluation. No induration; Warm and dry.  Neurologic: CN 2-12 grossly intact with no focal deficits. Romberg sign cerebellar reflexes not assessed.  Psychiatric: Very anxious and depressed appearing today. Awake and alert  Data Reviewed: I have personally  reviewed following labs and imaging studies  CBC: Recent Labs  Lab 08/14/24 0936 08/15/24 0514 08/16/24 0625 08/17/24 0624 08/18/24 0541  WBC 11.3* 9.5 6.5 5.2 5.1  NEUTROABS 10.4*  --  4.9 4.0 3.9  HGB 12.9* 10.9* 10.4* 10.7* 10.5*  HCT 39.0 32.8* 30.6* 32.0* 31.9*  MCV 89.4 90.1 87.9 88.4 89.6  PLT 176 147* 133* 161 172   Basic Metabolic Panel: Recent Labs  Lab 08/14/24 0936 08/14/24 1805 08/15/24 0514 08/16/24 0625 08/17/24 0624 08/18/24 0541  NA 135  --  139 141 138 139  K 4.1  --  3.8 3.8 3.8 3.6  CL 102  --  106 108 106 106  CO2 20*  --  21* 22 18* 21*  GLUCOSE 202*  --  112* 122* 123* 120*  BUN 19  --  17 15 15 14   CREATININE 1.56*  --  1.39* 1.56* 1.40* 1.46*  CALCIUM  9.4  --  8.8* 8.9 8.8* 8.9  MG  --  1.9 1.8 2.2 2.2 2.2  PHOS  --  2.0* 1.6* 2.7 2.8 2.6   GFR: Estimated Creatinine Clearance: 42.1 mL/min (A) (by C-G formula based on SCr of 1.46 mg/dL (H)). Liver Function Tests: Recent Labs  Lab 08/14/24 0936 08/15/24 0514 08/16/24 0625 08/17/24 0624 08/18/24 0541  AST 22 26 16 18 18   ALT 8 6 10 9 10   ALKPHOS 112 115 85 81 83  BILITOT 0.5 0.4 0.5 0.4 0.3  PROT 7.6 6.5 6.8 6.8 6.7  ALBUMIN 3.8 3.3* 3.3* 3.2* 3.2*   No results for input(s): LIPASE, AMYLASE in the last 168 hours. No results for input(s): AMMONIA in the last 168 hours. Coagulation Profile: Recent Labs  Lab 08/14/24 0936  INR 1.3*   Cardiac Enzymes: No results for input(s): CKTOTAL, CKMB, CKMBINDEX, TROPONINI in the last 168 hours. BNP (last 3 results) Recent Labs    08/14/24 1805  PROBNP 1,080.0*   HbA1C: No  results for input(s): HGBA1C in the last 72 hours. CBG: Recent Labs  Lab 08/17/24 1641 08/17/24 2156 08/18/24 0817 08/18/24 1149 08/18/24 1639  GLUCAP 103* 152* 108* 151* 95   Lipid Profile: No results for input(s): CHOL, HDL, LDLCALC, TRIG, CHOLHDL, LDLDIRECT in the last 72 hours. Thyroid  Function Tests: No results for input(s): TSH, T4TOTAL, FREET4, T3FREE, THYROIDAB in the last 72 hours. Anemia Panel: No results for input(s): VITAMINB12, FOLATE, FERRITIN, TIBC, IRON , RETICCTPCT in the last 72 hours. Sepsis Labs: Recent Labs  Lab 08/14/24 0947 08/14/24 1207 08/14/24 1805 08/15/24 0514  PROCALCITON  --   --  0.60  --   LATICACIDVEN 2.8* 2.7* 3.7* 1.5   Recent Results (from the past 240 hours)  Culture, blood (Routine x 2)     Status: None (Preliminary result)   Collection Time: 08/14/24  9:36 AM   Specimen: BLOOD RIGHT ARM  Result Value Ref Range Status   Specimen Description   Final    BLOOD RIGHT ARM Performed at [facility], [address] [place], [place] [address]    Special Requests   Final    BOTTLES DRAWN AEROBIC AND ANAEROBIC Blood Culture results may not be optimal due to an inadequate volume of blood received in culture bottles Performed at [facility], [address], [place], [place] [address]    Culture   Final    NO GROWTH 4 DAYS Performed at [facility], [address] [place], [place] [address]    Report Status PENDING  Incomplete  Resp panel by RT-PCR (RSV, Flu A&B, Covid)     Status: None   Collection Time: 08/14/24 10:03 AM   Specimen: Nasal Swab  Result Value Ref Range Status   SARS Coronavirus 2 by RT PCR NEGATIVE NEGATIVE Final    Comment: (NOTE) SARS-CoV-2 target nucleic acids are NOT DETECTED.  The SARS-CoV-2 RNA is generally detectable in upper respiratory specimens during the acute phase of infection. The lowest concentration of SARS-CoV-2 viral copies this assay  can detect is 138 copies/mL. A negative result does not preclude SARS-Cov-2 infection and should not be used as the sole basis for treatment or other patient management decisions. A negative result may occur with  improper specimen collection/handling, submission of specimen other than nasopharyngeal swab, presence of viral mutation(s) within the areas targeted by this assay, and inadequate number of viral copies(<138 copies/mL). A negative result must be combined with clinical observations, patient history, and epidemiological information. The expected result is Negative.  Fact Sheet for Patients:  bloggercourse.com  Fact Sheet for Healthcare Providers:  seriousbroker.it  This test is no t yet approved or cleared by the [place]  FDA and  has been authorized for detection and/or diagnosis of SARS-CoV-2 by FDA under an Emergency Use Authorization (EUA). This EUA will remain  in effect (meaning this test can be used) for the duration of the COVID-19 declaration under Section 564(b)(1) of the Act, 21 U.S.C.section 360bbb-3(b)(1), unless the authorization is terminated  or revoked sooner.       Influenza A by PCR NEGATIVE NEGATIVE Final   Influenza B by PCR NEGATIVE NEGATIVE Final    Comment: (NOTE) The Xpert Xpress SARS-CoV-2/FLU/RSV plus assay is intended as an aid in the diagnosis of influenza from Nasopharyngeal swab specimens and should not be used as a sole basis for treatment. Nasal washings and aspirates are unacceptable for Xpert Xpress SARS-CoV-2/FLU/RSV testing.  Fact Sheet for Patients: bloggercourse.com  Fact Sheet for Healthcare Providers: seriousbroker.it  This test is not yet approved or cleared by the [place]  FDA and has been authorized for detection and/or diagnosis of SARS-CoV-2 by FDA under an Emergency Use Authorization (EUA). This EUA will  remain in effect (meaning this test can be used) for the duration of the COVID-19 declaration under Section 564(b)(1) of the Act, 21 U.S.C. section 360bbb-3(b)(1), unless the authorization is terminated or revoked.     Resp Syncytial Virus by PCR NEGATIVE NEGATIVE Final    Comment: (NOTE) Fact Sheet for Patients: bloggercourse.com  Fact Sheet for Healthcare Providers: seriousbroker.it  This test is not yet approved or cleared by the [place]  FDA and has been authorized for detection and/or diagnosis of SARS-CoV-2 by FDA under an Emergency Use Authorization (EUA). This EUA will remain in effect (meaning this test can be used) for the duration of the COVID-19 declaration under Section 564(b)(1) of the Act, 21 U.S.C. section 360bbb-3(b)(1), unless the authorization is terminated or revoked.  Performed at [facility], [address], [place], [place] [address]   Culture, blood (Routine x 2)     Status: None (Preliminary result)   Collection Time: 08/14/24 10:13 AM   Specimen: BLOOD  Result Value Ref Range Status   Specimen Description   Final    BLOOD RIGHT ANTECUBITAL Performed at [facility], [address], [place], [place] [address]    Special Requests   Final    BOTTLES DRAWN AEROBIC AND ANAEROBIC Blood Culture adequate volume Performed at [facility],  [address] [place], [place] [address]    Culture   Final    NO GROWTH 4 DAYS Performed at [facility], [address] [place], [place] [address]    Report Status PENDING  Incomplete  MRSA Next Gen by PCR, Nasal     Status: None   Collection Time: 08/15/24 12:51 PM   Specimen: Nasal Mucosa  Result Value Ref Range Status   MRSA by PCR Next Gen NOT DETECTED NOT DETECTED Final    Comment: (NOTE) The GeneXpert MRSA Assay (FDA approved for NASAL specimens only), is one component of a comprehensive  MRSA colonization surveillance program. It is not intended to diagnose MRSA infection nor to guide or monitor treatment for MRSA infections. Test performance is not FDA approved in patients less than 79 years old. Performed at [facility], [address], [place], [place] [address]   Respiratory (~20 pathogens) panel by PCR     Status: None   Collection Time: 08/15/24 12:52 PM   Specimen: Nasopharyngeal Swab; Respiratory  Result Value Ref Range Status   Adenovirus NOT DETECTED NOT DETECTED Final   Coronavirus 229E NOT DETECTED NOT DETECTED Final    Comment: (NOTE) The Coronavirus on the Respiratory Panel, DOES NOT test for the novel  Coronavirus (2019 nCoV)    Coronavirus HKU1 NOT DETECTED NOT DETECTED Final   Coronavirus NL63 NOT DETECTED NOT DETECTED Final   Coronavirus OC43 NOT DETECTED NOT DETECTED Final   Metapneumovirus NOT DETECTED NOT DETECTED Final   Rhinovirus / Enterovirus NOT DETECTED NOT DETECTED Final   Influenza A NOT DETECTED NOT DETECTED Final   Influenza B NOT DETECTED NOT DETECTED Final   Parainfluenza Virus 1 NOT DETECTED NOT DETECTED Final   Parainfluenza Virus 2 NOT DETECTED NOT DETECTED Final   Parainfluenza Virus 3 NOT DETECTED NOT DETECTED Final   Parainfluenza Virus 4 NOT DETECTED NOT DETECTED Final   Respiratory Syncytial Virus NOT DETECTED NOT DETECTED Final   Bordetella pertussis NOT DETECTED NOT DETECTED Final   Bordetella Parapertussis NOT DETECTED NOT DETECTED Final   Chlamydophila pneumoniae NOT DETECTED NOT DETECTED Final   Mycoplasma pneumoniae NOT DETECTED NOT DETECTED Final    Comment: Performed at [facility], [address] [place], [place] [address]    Radiology Studies: DG CHEST PORT 1 VIEW Result Date: 08/18/2024 EXAM: 1 VIEW(S) XRAY OF THE CHEST 08/18/2024 04:42:00 AM COMPARISON: 08/17/2024 CLINICAL HISTORY: SOB (shortness of breath) FINDINGS: LUNGS AND PLEURA: Emphysema. Similar diffuse hazy left lung  opacities. Small right pleural effusion. Mass within a medial right lung base opacity is again noted and appears unchanged. Progressive opacity within the right lung base noted which may reflect postobstructive pneumonitis in the setting of lung mass. No pneumothorax. HEART AND MEDIASTINUM: No acute abnormality of the cardiac and mediastinal silhouettes. BONES AND SOFT TISSUES: No acute osseous abnormality. IMPRESSION: 1. Progressive opacity within the right lung base, possibly reflecting postobstructive pneumonitis in the setting of a stable medial right lung base mass. 2. Small right pleural effusion. Electronically signed by: Waddell Calk MD 08/18/2024 06:31 AM EST RP Workstation: [id number]   DG CHEST PORT 1 VIEW Result Date: 08/17/2024 CLINICAL DATA:  Shortness of breath. EXAM: PORTABLE CHEST 1 VIEW COMPARISON:  Radiograph yesterday, CT 08/14/2024 FINDINGS: Improved left lung base atelectasis. Stable rounded right lung base opacity with slight increase in right lung base atelectasis. Redemonstrated emphysematous changes and bronchial thickening. Stable heart size and mediastinal contours. No pneumothorax or large pleural  effusion. IMPRESSION: 1. Improved left lung base atelectasis. 2. Stable rounded right lung base opacity, better assessed on prior CT. 3. Slight increase in right lung base atelectasis. Electronically Signed   By: Andrea Gasman M.D.   On: 08/17/2024 10:48   Scheduled Meds:  allopurinol   100 mg Oral Daily   amLODipine   5 mg Oral QPC supper   apixaban   5 mg Oral BID   arformoterol   15 mcg Nebulization BID   budesonide  (PULMICORT ) nebulizer solution  0.25 mg Nebulization BID   docusate sodium   100 mg Oral Daily   doxycycline   100 mg Oral Q12H   ferrous sulfate   325 mg Oral Q48H   guaiFENesin   1,200 mg Oral BID   insulin  aspart  0-15 Units Subcutaneous TID WC   metoprolol  tartrate  25 mg Oral BID   pantoprazole   40 mg Oral BID   polyethylene glycol  17 g Oral Daily   sodium  bicarbonate  650 mg Oral BID   sodium chloride  flush  3 mL Intravenous Q12H   sodium chloride  flush  3 mL Intravenous Q12H   Continuous Infusions:  cefTRIAXone  (ROCEPHIN )  IV Stopped (08/17/24 2222)    LOS: 3 days   Alejandro Marker, DO [facility] Available via Epic secure chat 7am-7pm After these hours, please refer to coverage provider listed on [email or web address] 08/18/2024, 9:07 PM  "

## 2024-08-18 NOTE — Plan of Care (Signed)
" °  Problem: Fluid Volume: Goal: Hemodynamic stability will improve Outcome: Progressing   Problem: Clinical Measurements: Goal: Diagnostic test results will improve Outcome: Progressing   Problem: Respiratory: Goal: Ability to maintain adequate ventilation will improve Outcome: Progressing   Problem: Metabolic: Goal: Ability to maintain appropriate glucose levels will improve Outcome: Progressing   Problem: Nutritional: Goal: Maintenance of adequate nutrition will improve Outcome: Progressing   Problem: Elimination: Goal: Will not experience complications related to bowel motility Outcome: Progressing   "

## 2024-08-19 ENCOUNTER — Other Ambulatory Visit (HOSPITAL_COMMUNITY): Payer: Self-pay

## 2024-08-19 ENCOUNTER — Encounter (HOSPITAL_COMMUNITY): Payer: Self-pay | Admitting: Family Medicine

## 2024-08-19 ENCOUNTER — Inpatient Hospital Stay (HOSPITAL_COMMUNITY)

## 2024-08-19 DIAGNOSIS — J9601 Acute respiratory failure with hypoxia: Secondary | ICD-10-CM | POA: Diagnosis not present

## 2024-08-19 DIAGNOSIS — F4310 Post-traumatic stress disorder, unspecified: Secondary | ICD-10-CM | POA: Diagnosis not present

## 2024-08-19 DIAGNOSIS — M1A00X0 Idiopathic chronic gout, unspecified site, without tophus (tophi): Secondary | ICD-10-CM

## 2024-08-19 DIAGNOSIS — I10 Essential (primary) hypertension: Secondary | ICD-10-CM | POA: Diagnosis not present

## 2024-08-19 DIAGNOSIS — I2699 Other pulmonary embolism without acute cor pulmonale: Secondary | ICD-10-CM

## 2024-08-19 DIAGNOSIS — C3431 Malignant neoplasm of lower lobe, right bronchus or lung: Secondary | ICD-10-CM | POA: Diagnosis not present

## 2024-08-19 DIAGNOSIS — K219 Gastro-esophageal reflux disease without esophagitis: Secondary | ICD-10-CM | POA: Diagnosis not present

## 2024-08-19 DIAGNOSIS — J189 Pneumonia, unspecified organism: Secondary | ICD-10-CM | POA: Diagnosis not present

## 2024-08-19 DIAGNOSIS — J9611 Chronic respiratory failure with hypoxia: Secondary | ICD-10-CM | POA: Diagnosis not present

## 2024-08-19 DIAGNOSIS — R6520 Severe sepsis without septic shock: Secondary | ICD-10-CM | POA: Diagnosis not present

## 2024-08-19 DIAGNOSIS — N1832 Chronic kidney disease, stage 3b: Secondary | ICD-10-CM | POA: Diagnosis not present

## 2024-08-19 DIAGNOSIS — A419 Sepsis, unspecified organism: Secondary | ICD-10-CM | POA: Diagnosis not present

## 2024-08-19 LAB — GLUCOSE, CAPILLARY
Glucose-Capillary: 123 mg/dL — ABNORMAL HIGH (ref 70–99)
Glucose-Capillary: 146 mg/dL — ABNORMAL HIGH (ref 70–99)

## 2024-08-19 LAB — PHOSPHORUS: Phosphorus: 2.6 mg/dL (ref 2.5–4.6)

## 2024-08-19 LAB — COMPREHENSIVE METABOLIC PANEL WITH GFR
ALT: 8 U/L (ref 0–44)
AST: 18 U/L (ref 15–41)
Albumin: 3.4 g/dL — ABNORMAL LOW (ref 3.5–5.0)
Alkaline Phosphatase: 97 U/L (ref 38–126)
Anion gap: 12 (ref 5–15)
BUN: 14 mg/dL (ref 8–23)
CO2: 21 mmol/L — ABNORMAL LOW (ref 22–32)
Calcium: 9 mg/dL (ref 8.9–10.3)
Chloride: 106 mmol/L (ref 98–111)
Creatinine, Ser: 1.49 mg/dL — ABNORMAL HIGH (ref 0.61–1.24)
GFR, Estimated: 47 mL/min — ABNORMAL LOW
Glucose, Bld: 119 mg/dL — ABNORMAL HIGH (ref 70–99)
Potassium: 3.6 mmol/L (ref 3.5–5.1)
Sodium: 139 mmol/L (ref 135–145)
Total Bilirubin: 0.3 mg/dL (ref 0.0–1.2)
Total Protein: 6.9 g/dL (ref 6.5–8.1)

## 2024-08-19 LAB — CBC WITH DIFFERENTIAL/PLATELET
Abs Immature Granulocytes: 0.02 K/uL (ref 0.00–0.07)
Basophils Absolute: 0.1 K/uL (ref 0.0–0.1)
Basophils Relative: 1 %
Eosinophils Absolute: 0.1 K/uL (ref 0.0–0.5)
Eosinophils Relative: 2 %
HCT: 32.1 % — ABNORMAL LOW (ref 39.0–52.0)
Hemoglobin: 10.6 g/dL — ABNORMAL LOW (ref 13.0–17.0)
Immature Granulocytes: 0 %
Lymphocytes Relative: 16 %
Lymphs Abs: 0.8 K/uL (ref 0.7–4.0)
MCH: 29.2 pg (ref 26.0–34.0)
MCHC: 33 g/dL (ref 30.0–36.0)
MCV: 88.4 fL (ref 80.0–100.0)
Monocytes Absolute: 0.4 K/uL (ref 0.1–1.0)
Monocytes Relative: 8 %
Neutro Abs: 3.8 K/uL (ref 1.7–7.7)
Neutrophils Relative %: 73 %
Platelets: 186 K/uL (ref 150–400)
RBC: 3.63 MIL/uL — ABNORMAL LOW (ref 4.22–5.81)
RDW: 15.2 % (ref 11.5–15.5)
WBC: 5.2 K/uL (ref 4.0–10.5)
nRBC: 0 % (ref 0.0–0.2)

## 2024-08-19 LAB — CULTURE, BLOOD (ROUTINE X 2)
Culture: NO GROWTH
Culture: NO GROWTH
Special Requests: ADEQUATE

## 2024-08-19 LAB — MAGNESIUM: Magnesium: 2.1 mg/dL (ref 1.7–2.4)

## 2024-08-19 MED ORDER — AMLODIPINE BESYLATE 5 MG PO TABS
5.0000 mg | ORAL_TABLET | Freq: Every day | ORAL | 0 refills | Status: AC
  Filled 2024-08-19: qty 30, 30d supply, fill #0

## 2024-08-19 MED ORDER — DOXYCYCLINE HYCLATE 100 MG PO TABS
100.0000 mg | ORAL_TABLET | Freq: Two times a day (BID) | ORAL | 0 refills | Status: AC
  Filled 2024-08-19: qty 6, 3d supply, fill #0

## 2024-08-19 MED ORDER — CEFDINIR 300 MG PO CAPS
300.0000 mg | ORAL_CAPSULE | Freq: Two times a day (BID) | ORAL | 0 refills | Status: AC
  Filled 2024-08-19: qty 6, 3d supply, fill #0

## 2024-08-19 NOTE — Progress Notes (Addendum)
 Patient is a high fall risk, not appropriate for DC lounge. Partner contacted to transport home.Discharge instructions reviewed with the pt and significant other, Ms Heron. They denied questions, or concerns at this time.  Call placed to Neuro Psych [phone]) to confirm appt time. Spoke to receptionist appt is  09/17/2024 at 12:40 pm. No change from am assessment. IV removed, site is clean dry and intact.

## 2024-08-19 NOTE — Plan of Care (Signed)
" °  Problem: Fluid Volume: Goal: Hemodynamic stability will improve Outcome: Progressing   Problem: Clinical Measurements: Goal: Diagnostic test results will improve Outcome: Progressing   Problem: Respiratory: Goal: Ability to maintain adequate ventilation will improve Outcome: Progressing   Problem: Health Behavior/Discharge Planning: Goal: Ability to manage health-related needs will improve Outcome: Progressing   Problem: Skin Integrity: Goal: Risk for impaired skin integrity will decrease Outcome: Progressing   Problem: Tissue Perfusion: Goal: Adequacy of tissue perfusion will improve Outcome: Progressing   Problem: Education: Goal: Knowledge of General Education information will improve Description: Including pain rating scale, medication(s)/side effects and non-pharmacologic comfort measures Outcome: Progressing   Problem: Health Behavior/Discharge Planning: Goal: Ability to manage health-related needs will improve Outcome: Progressing   Problem: Clinical Measurements: Goal: Ability to maintain clinical measurements within normal limits will improve Outcome: Progressing Goal: Diagnostic test results will improve Outcome: Progressing Goal: Respiratory complications will improve Outcome: Progressing   Problem: Activity: Goal: Risk for activity intolerance will decrease Outcome: Progressing   Problem: Nutrition: Goal: Adequate nutrition will be maintained Outcome: Progressing   Problem: Coping: Goal: Level of anxiety will decrease Outcome: Progressing   Problem: Pain Managment: Goal: General experience of comfort will improve and/or be controlled Outcome: Progressing   Problem: Safety: Goal: Ability to remain free from injury will improve Outcome: Progressing   Problem: Skin Integrity: Goal: Risk for impaired skin integrity will decrease Outcome: Progressing   "

## 2024-08-19 NOTE — Discharge Summary (Signed)
 " Physician Discharge Summary   Patient: Richard Navarro MRN: [id number] DOB: [birth year]  Admit date:     08/14/2024  Discharge date: 08/19/2024  Discharge Physician: Alejandro Marker, DO   PCP: Katrinka Garnette KIDD, MD   Recommendations at discharge:   Follow-up with PCP within 1 to 2 weeks and repeat CBC, CMP, mag, Phos within 1 week Follow-up with pulmonary Dr. Shelah within 1 to 2 weeks and repeat chest x-ray in 3 to 6 weeks Follow-up with psychiatry in outpatient setting Follow-up with neurology in the outpatient setting for full neurocognitive testing  Discharge Diagnoses: Principal Problem:   CAP (community acquired pneumonia) Active Problems:   Sepsis (HCC)   Acute pulmonary embolism (HCC)   Diabetes mellitus with renal manifestation (HCC)   Essential hypertension   GERD (gastroesophageal reflux disease)   Hyperlipidemia   Chronic kidney disease (CKD) stage G3b/A1, moderately decreased glomerular filtration rate (GFR) between 30-44 mL/min/1.73 square meter and albuminuria creatinine ratio less than 30 mg/g (HCC)   Anemia   Gout   Former smoker   PTSD (post-traumatic stress disorder)   COPD (chronic obstructive pulmonary disease) (HCC)   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 *  Hospital Course: TAEGAN STANDAGE is an 78 year old male with extensive history of HTN, HLD, Right lung CA, CKD 3b, DM II, depression, GERD, PE (on Eliquis ) chronic iron  deficiency anemia, Gout, Falls, compression fracture and other comorbidites Presenting to ED with complaint of shortness of breath, cough, Tmax at home 101.5. Reports of progressive generalized weaknesses, reduced oral intake, difficulty ambulating. Also had a brief episode of chest pain. Ex-wife reports since his visit to the ED in Thanksgiving he has not been feeling  well. He stopped his lung cancer treatment about 10-11 months ago, due to intolerance, low blood count. Being admitted for Sepsis 2/2 to CAP and had Acute on Chronic Respiratory Failure with Hypoxia.   Slowly improving and will continue current treatment. Will resume gentle IVF with NS. He is improving but having quite a bit of PTSD so consulted Psych for further evaluation.  His oxygen  was weaned and psychiatry cleared him.  He is medically stable for discharge and he will need to follow-up with PCP, pulmonary in outpatient setting within 1 to 2 weeks  Assessment and Plan:  CAP (community acquired pneumonia): Left upper lobe per imaging -Broad-spectrum antibiotics as below to be de-escalated -Will follow-up with cultures and Tx as below -WBC went from 9.5 -> 6.5 -> 5.2 ->5.1 is now again 5.2 -CXR as below   History of Pulmonary Embolism: C/w Anticoagulation with Apixaban  5 mg po BID. CT angiogram on admit does not reveal any pulmonary embolism findings   Severe Sepsis (HCC) in the setting of Above: Patient meeting SIRS, early sepsis criteria with Acute respiratory failure likely due to pneumonia, with lactic acidosis, Temp, HR -CT angiogram reviewed, likely left upper lobe pneumonia, right upper lobe changes consistent with chronic cancer  -Per wife at home Tmax 101.5, RR 102, satting 98% on 1 L of oxygen  WBC 11.3, lactic acid 2.8, 2.7 -Initiating IV fluid, antibiotics per sepsis protocol likely source pneumonia -Per sepsis protocol, initiated broad-spectrum antibiotics of cefepime  and vancomycin  but will narrow down Abx to Ceftriaxone  and Doxycycline .  -Will follow-up with blood cultures and they showed no growth to date at 5 days -Will try to  obtain sputum culture for Gram stain and culture -Tx as Below  -PT/OT recommending Home HHPT/HHOT and feel this plan remains appropriate  Acute on Chronic Respiratory Failure w/ Hypoxia: Has supplemental O2 at Home and uses it PRN and now presenting  with continuous need of supplemental oxygen ; Weaning off accordingly -Exacerbated by with underlying lung CA and new post obstructive pneumonia  -Continue IV Antibiotics w/ IV Cefepime  and IV Vancomycin , supplemental oxygen , nebs as below -Weaning off supplemental O2 with a goal of O2 greater 92%; Able to be weaned and SpO2 was 92-99% on RA -Encouraged spirometer and flutter valve use -CXR done today and showed  Again seen medial right lung base nodular masslike opacity. Increasing streaky opacities peripheral to the lesion may represent postobstructive change. Improving right pleural effusion.  Slight improvement in left midlung opacity.  Normocytic Anemia: Anemia of chronic disease along with iron  deficiency anemia. Hgb/Hct Trend:  Recent Labs  Lab 08/14/24 0936 08/15/24 0514 08/16/24 0625 08/17/24 0624 08/18/24 0541 08/19/24 0550  HGB 12.9* 10.9* 10.4* 10.7* 10.5* 10.6*  HCT 39.0 32.8* 30.6* 32.0* 31.9* 32.1*  MCV 89.4 90.1 87.9 88.4 89.6 88.4  -C/w Ferrous Sulfate  325 mg po q48h -Likely has dilutional drop as well with IVF resuscitation. CTM for S/Sx of Bleeding; No overt bleeding noted. Repeat CBC in the AM    Chronic kidney disease (CKD) stage G3b/A1, moderately decreased glomerular filtration rate (GFR) between 30-44 mL/min/1.73 square meter and albuminuria creatinine ratio less than 30 mg/g [facility]): BUN/Cr Trend Recent Labs  Lab 08/14/24 0936 08/15/24 0514 08/16/24 0625 08/17/24 0624 08/18/24 0541 08/19/24 0550  BUN 19 17 15 15 14 14   CREATININE 1.56* 1.39* 1.56* 1.40* 1.46* 1.49*  -Start IV fluid hydration -Avoid Nephrotoxic Medications, Contrast Dyes, Hypotension and Dehydration to Ensure Adequate Renal Perfusion and will need to Renally Adjust Meds. CTM & Trend & repeat CMP in the AM   Metabolic Acidosis: Mild and improving. CO2 is now 21, Chloride Level is 106, and AG is 12. IVF as above and will stop. CTM and Trend and repeat CMP in the AM.   Hyperlipidemia: On  review of MAR does not seem to be on a Statin.     Essential Hypertension:  Blood pressure remains stable, early phases of sepsis Reviewed home medications of Amlodipine , Lopressor  and currently held and will resume as able. CTM BP per Protocol Last BP reading was 140/78   Diabetes Mellitus with renal manifestation [facility]): Holding home regimen including Glipizide  5 mg po Daily and Metformin  500 mg po Daily: Recommending reduce dose or discontinuing Metformin  @ D/C due to ongoing CKD. C/w Moderate Novolog  SSI AC. CTM CBGs per Protocol. 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 Patient to will need to follow-up with Dr. Sherrod as outpatient   PTSD and GAD: Having a lot of flashbacks from his time in [place] and being exposed to [employer]. Psych consulted for further evaluation and they recommended prazosin for the patient but he declined and he also declined therapy.  Psych felt that there is no psychiatric contraindications to discharge at this time and felt that he had a minimal risk of self-harm  COPD (Chronic Obstructive Pulmonary  Disease) [facility]): Currently stable no excessive wheezing no use of accessory muscles; Mild hypoxia needing 1-2 L of oxygen  to maintain O2 sat greater 90%. SpO2: 94 % O2 Flow Rate (L/min): 2 L/min -Start DuoNeb q6h. C/w Guaifenesin  1200 mg po BID, Incentive Spirometry and Flutter Valve -Also add Brovana  and Budesonide ; Hold Stiolto  -C/w Supplemental O2 via [place] weaned and he is medically stable - Follow-up with pulmonary outpatient setting   Former Smoker: Has quit few years ago; Currently not relapsed.    GERD (gastroesophageal reflux disease)/ GI Prophylaxis:  Continue PPI w/ Pantoprazole  40 mg po BID  Hypoalbuminemia: Patient's Albumin Lvl went from 3.8 -> 3.3 x2 -> 3.2 x2 is now 3.4 at the time of discharge. CTM & Trend & repeat CMP in the AM  Consultants: Psychiatry   Procedures performed: As delineated as above   Disposition: Home health Diet recommendation:  Discharge Diet Orders (From admission, onward)     Start     Ordered   08/19/24 0000  Diet Carb Modified        08/19/24 1243   08/19/24 0000  Diet - low sodium heart healthy        08/19/24 1243           Cardiac and Carb modified diet DISCHARGE MEDICATION: Allergies as of 08/19/2024       Reactions   Afinitor  [everolimus ] Swelling, Other (See Comments)   Angioedema- Patient was on this and Lisinopril  at the same time; had no trouble breathing, but throat became swollen   Lisinopril  Swelling, Other (See Comments)   Angioedema- on this and afinitor  same time; had no trouble breathing, but throat became swollen   Other Other (See Comments)   Certain seafoods cause gout flares   Rosuvastatin  Other (See Comments)   Chills and muscle aches   Statins Other (See Comments)   Joint pain   Simvastatin Other (See Comments)   Joint pain        Medication List     STOP taking these medications    metFORMIN  500 MG tablet Commonly known as: GLUCOPHAGE        TAKE these medications    albuterol  108 (90 Base) MCG/ACT inhaler Commonly known as: VENTOLIN  HFA Inhale 2 puffs into the lungs every 4 (four) hours as needed for wheezing or shortness of breath.   allopurinol  100 MG tablet Commonly known as: ZYLOPRIM  TAKE 1 TABLET DAILY   amLODipine  5 MG tablet Commonly known as: NORVASC  Take 1 tablet (5 mg total) by mouth daily after supper.   apixaban  5 MG Tabs tablet Commonly known as: Eliquis  Take 1 tablet (5 mg total) by mouth 2 (two) times daily.   ascorbic acid  500 MG tablet Commonly known as: VITAMIN C  Take 500 mg by mouth daily.   cefdinir 300 MG  capsule Commonly known as: OMNICEF Take 1 capsule (300 mg total) by mouth 2 (two) times daily for 3 days.   cyanocobalamin  1000 MCG tablet Commonly known as: VITAMIN B12 Take 1,000 mcg by mouth daily.   docusate sodium  100 MG capsule Commonly known as: COLACE Take 100 mg by mouth in the morning.   doxycycline  100 MG tablet Commonly known as: VIBRA -TABS Take 1 tablet (100 mg total) by mouth every 12 (twelve) hours for 3 days.   Dulcolax 5 MG EC tablet Generic drug: bisacodyl  Take 5 mg by mouth daily as needed for moderate constipation.   feeding supplement (GLUCERNA SHAKE) Liqd Take 237 mLs by mouth 2 (two) times  daily as needed (when not eating).   Gas-X Extra Strength 125 MG chewable tablet Generic drug: simethicone Chew 125 mg by mouth every 6 (six) hours as needed for flatulence.   Gaviscon Extra Strength 160-105 MG Chew Generic drug: Alum Hydroxide-Mag Carbonate Chew 1 tablet by mouth every 6 (six) hours as needed (for gas).   glipiZIDE  5 MG tablet Commonly known as: GLUCOTROL  Take 1 tablet (5 mg total) by mouth daily before breakfast.   glucose blood test strip Use to test blood sugar twice a day   guaiFENesin  600 MG 12 hr tablet Commonly known as: MUCINEX  Take 600 mg by mouth See admin instructions. Take 600 mg by mouth with breakfast and supper   Iron  325 (65 Fe) MG Tabs Take 325 mg by mouth See admin instructions. Take 325 mg by mouth every other night   ketotifen  0.025 % ophthalmic solution Commonly known as: ZADITOR  Place 1 drop into both eyes 2 (two) times daily as needed (allergies).   magnesium  hydroxide 400 MG/5ML suspension Commonly known as: MILK OF MAGNESIA Take 30 mLs by mouth daily as needed (constipation).   metoprolol  tartrate 25 MG tablet Commonly known as: LOPRESSOR  TAKE 1 TABLET TWICE A DAY   OXYGEN  Inhale 2 L/min into the lungs as needed (for shortness of breath).   pantoprazole  40 MG tablet Commonly known as: PROTONIX  Take 1  tablet (40 mg total) by mouth 2 (two) times daily.   polyethylene glycol 17 g packet Commonly known as: MIRALAX  / GLYCOLAX  Take 17 g by mouth daily.   Stiolto Respimat  2.5-2.5 MCG/ACT Aers Generic drug: Tiotropium Bromide -Olodaterol Inhale 2 puffs into the lungs daily. What changed:  how much to take when to take this   Tylenol  8 Hour 650 MG CR tablet Generic drug: acetaminophen  Take 650 mg by mouth every 8 (eight) hours as needed for pain.   Vitamin D3 25 MCG (1000 UT) Caps Take 1,000 Units by mouth daily.        Discharge Exam: Filed Weights   08/16/24 0500 08/18/24 0544 08/19/24 9357  Weight: 73.8 kg 73.8 kg 72.4 kg   Vitals:   08/19/24 0944 08/19/24 1126  BP: (!) 146/64 (!) 151/79  Pulse: 86 75  Resp: 16   Temp: 98.4 F (36.9 C) 98.1 F (36.7 C)  SpO2:     Examination: Physical Exam:  Constitutional: Chronically ill-appearing elderly African-American male in no acute distress Respiratory: Diminished to auscultation bilaterally with some coarse breath sounds and some slight rhonchi but no real appreciable wheezing or rales.  Has slight crackles.  Normal respiratory effort and patient is not tachypenic. No accessory muscle use.  Unlabored breathing and not wearing supplemental oxygen  nasal cannula Cardiovascular: RRR, no murmurs / rubs / gallops. S1 and S2 auscultated. No extremity edema. Abdomen: Soft, non-tender, non-distended. Bowel sounds positive.  GU: Deferred. Musculoskeletal: No clubbing / cyanosis of digits/nails. No joint deformity upper and lower extremities.  Skin: No rashes, lesions, ulcers on limited skin evaluation. No induration; Warm and dry.  Neurologic: CN 2-12 grossly intact with no focal deficits. Romberg sign and cerebellar reflexes not assessed.  Psychiatric: Normal judgment and insight. Alert and oriented x 3. Normal mood and appropriate affect.   Condition at discharge: stable  The results of significant diagnostics from this  hospitalization (including imaging, microbiology, ancillary and laboratory) are listed below for reference.   Imaging Studies: DG CHEST PORT 1 VIEW Result Date: 08/19/2024 CLINICAL DATA:  Shortness of breath. EXAM: PORTABLE CHEST 1 VIEW COMPARISON:  Radiograph yesterday.  CT 08/14/2024 FINDINGS: Normal heart size with stable mediastinal contours. Again seen medial right lung base nodular masslike opacity. Increasing streaky opacities peripheral to the lesion may represent postobstructive change. Improving right pleural effusion. Emphysema with diffuse peribronchial thickening. Slight improvement in left midlung opacity. No pneumothorax. IMPRESSION: 1. Again seen medial right lung base nodular masslike opacity. Increasing streaky opacities peripheral to the lesion may represent postobstructive change. 2. Improving right pleural effusion. 3. Slight improvement in left midlung opacity. Electronically Signed   By: Andrea Gasman M.D.   On: 08/19/2024 15:50   DG CHEST PORT 1 VIEW Result Date: 08/18/2024 EXAM: 1 VIEW(S) XRAY OF THE CHEST 08/18/2024 04:42:00 AM COMPARISON: 08/17/2024 CLINICAL HISTORY: SOB (shortness of breath) FINDINGS: LUNGS AND PLEURA: Emphysema. Similar diffuse hazy left lung opacities. Small right pleural effusion. Mass within a medial right lung base opacity is again noted and appears unchanged. Progressive opacity within the right lung base noted which may reflect postobstructive pneumonitis in the setting of lung mass. No pneumothorax. HEART AND MEDIASTINUM: No acute abnormality of the cardiac and mediastinal silhouettes. BONES AND SOFT TISSUES: No acute osseous abnormality. IMPRESSION: 1. Progressive opacity within the right lung base, possibly reflecting postobstructive pneumonitis in the setting of a stable medial right lung base mass. 2. Small right pleural effusion. Electronically signed by: Waddell Calk MD 08/18/2024 06:31 AM EST RP Workstation: [id number]   DG CHEST PORT 1  VIEW Result Date: 08/17/2024 CLINICAL DATA:  Shortness of breath. EXAM: PORTABLE CHEST 1 VIEW COMPARISON:  Radiograph yesterday, CT 08/14/2024 FINDINGS: Improved left lung base atelectasis. Stable rounded right lung base opacity with slight increase in right lung base atelectasis. Redemonstrated emphysematous changes and bronchial thickening. Stable heart size and mediastinal contours. No pneumothorax or large pleural effusion. IMPRESSION: 1. Improved left lung base atelectasis. 2. Stable rounded right lung base opacity, better assessed on prior CT. 3. Slight increase in right lung base atelectasis. Electronically Signed   By: Andrea Gasman M.D.   On: 08/17/2024 10:48   DG CHEST PORT 1 VIEW Result Date: 08/16/2024 CLINICAL DATA:  Shortness of breath. EXAM: PORTABLE CHEST 1 VIEW COMPARISON:  08/14/2024 radiographs and CT FINDINGS: The heart is normal in size. Mediastinal contours are unchanged. Again seen emphysema with diffuse bronchial thickening. Rounded opacity at the right lung base, unchanged. Minimal patchy opacity in the left mid lung. Small right pleural effusion on CT is not well demonstrated by radiograph. No pneumothorax. IMPRESSION: 1. Rounded opacity at the right lung base, unchanged from prior exam. 2. Minimal patchy opacity in the left mid lung, atelectasis versus pneumonia. 3. Emphysema with chronic bronchial thickening. Electronically Signed   By: Andrea Gasman M.D.   On: 08/16/2024 12:22   CT Angio Chest PE W and/or Wo Contrast Result Date: 08/14/2024 CLINICAL DATA:  History of primary right lung cancer presenting with chest pain with weakness and cough. EXAM: CT ANGIOGRAPHY CHEST WITH CONTRAST TECHNIQUE: Multidetector CT imaging of the chest was performed using the standard protocol during bolus administration of intravenous contrast. Multiplanar CT image reconstructions and MIPs were obtained to evaluate the vascular anatomy. RADIATION DOSE REDUCTION: This exam was performed  according to the departmental dose-optimization program which includes automated exposure control, adjustment of the mA and/or kV according to patient size and/or use of iterative reconstruction technique. CONTRAST:  60mL OMNIPAQUE  IOHEXOL  350 MG/ML SOLN COMPARISON:  July 16, 2024 FINDINGS: Cardiovascular: Satisfactory opacification of the pulmonary arteries to the segmental level. No evidence of pulmonary embolism. Normal heart  size. No pericardial effusion. Mediastinum/Nodes: There is stable right hilar and AP window lymphadenopathy. Occlusion of the lower lobe branch of the bronchus intermedius is seen. The thyroid  gland and esophagus demonstrate no significant findings. Lungs/Pleura: There is extensive centrilobular and paraseptal emphysematous lung disease. Mild posterior left upper lobe infiltrate is seen with mild atelectasis and/or infiltrate present within the posterior aspect of the left lung base. A 5.3 cm x 3.4 cm right posterior infrahilar mass and adjacent, likely postobstructive right lower lobe consolidation is again seen (measured approximately 3.5 cm x 3.9 cm on the prior study). There is a small right pleural effusion. No pneumothorax is identified. Upper Abdomen: There is a small to moderate sized hiatal hernia. A large stool burden is present. A stable 4.5 cm simple cyst is noted within the right kidney. Musculoskeletal: No chest wall abnormality. No acute or significant osseous findings. Review of the MIP images confirms the above findings. IMPRESSION: 1. No evidence of pulmonary embolism. 2. 5.3 cm x 3.4 cm right posterior infrahilar mass and adjacent, likely postobstructive right lower lobe consolidation, as described above. Is likely represents the patient's known primary cancer of the right lung. 3. Mild posterior left upper lobe infiltrate with mild left basilar atelectasis and/or infiltrate. 4. Small right pleural effusion. 5. Stable right hilar and mediastinal lymphadenopathy which  may represent sequelae associated with metastatic disease. 6. Small to moderate sized hiatal hernia. 7. Stable simple right renal cyst. 8. Emphysema. Electronically Signed   By: Suzen Dials M.D.   On: 08/14/2024 12:45   DG Chest Port 1 View if patient is in a treatment room. Result Date: 08/14/2024 CLINICAL DATA:  Cough and chest pain. EXAM: PORTABLE CHEST 1 VIEW COMPARISON:  07/16/2024 FINDINGS: Streaky linear density at the lung bases is similar to prior likely atelectatic or scarring. No edema or focal airspace consolidation. The cardiopericardial silhouette is within normal limits for size. Telemetry leads overlie the chest. IMPRESSION: Streaky bibasilar atelectasis or scarring. Electronically Signed   By: Camellia Candle M.D.   On: 08/14/2024 11:11   MR BRAIN WO CONTRAST Result Date: 08/09/2024 EXAM: MRI BRAIN WITHOUT CONTRAST 08/04/2024 05:28:18 PM TECHNIQUE: Multiplanar multisequence MRI of the head/brain was performed without the administration of intravenous contrast. COMPARISON: MR Head 04/18/2023. CLINICAL HISTORY: Memory impairment. FINDINGS: BRAIN AND VENTRICLES: No acute infarct. No intracranial hemorrhage. No mass. No midline shift. No hydrocephalus. Age-related cerebral volume loss. Mild-to-moderate periventricular and subcortical white matter disease. The sella is unremarkable. Normal flow voids. ORBITS: The patient is status post bilateral lens replacement. No acute abnormality. SINUSES AND MASTOIDS: No acute abnormality. BONES AND SOFT TISSUES: Normal marrow signal. No acute soft tissue abnormality. IMPRESSION: 1. Age-related cerebral volume loss and mild-to-moderate periventricular and subcortical white matter disease. Electronically signed by: Evalene Coho MD 08/09/2024 10:58 AM EST RP Workstation: [id number]   Microbiology: Results for orders placed or performed during the hospital encounter of 08/14/24  Culture, blood (Routine x 2)     Status: None   Collection Time:  08/14/24  9:36 AM   Specimen: BLOOD RIGHT ARM  Result Value Ref Range Status   Specimen Description   Final    BLOOD RIGHT ARM Performed at [facility], [address] [place], [place] [address]    Special Requests   Final    BOTTLES DRAWN AEROBIC AND ANAEROBIC Blood Culture results may not be optimal due to an inadequate volume of blood received in culture bottles Performed at [facility], [address] [place], [place] [address]    Culture   Final    NO GROWTH 5 DAYS Performed at [facility], [address] [place], [place] [address]    Report Status 08/19/2024 FINAL  Final  Resp panel by RT-PCR (RSV, Flu A&B, Covid)     Status: None   Collection Time: 08/14/24 10:03 AM   Specimen: Nasal Swab  Result Value Ref Range Status   SARS Coronavirus 2 by RT PCR NEGATIVE NEGATIVE Final    Comment: (NOTE) SARS-CoV-2 target nucleic acids are NOT DETECTED.  The SARS-CoV-2 RNA is generally detectable in upper respiratory specimens during the acute phase of infection. The lowest concentration of SARS-CoV-2 viral copies this assay can detect is 138 copies/mL. A negative result does not preclude SARS-Cov-2 infection and should not be used as the sole basis for treatment or other patient management decisions. A negative result may occur with  improper specimen collection/handling, submission of specimen other than nasopharyngeal swab, presence of viral mutation(s) within the areas targeted by this assay, and inadequate number of viral copies(<138 copies/mL). A negative result must be combined with clinical observations, patient history, and epidemiological information. The expected result is Negative.  Fact Sheet for Patients:  bloggercourse.com  Fact Sheet for Healthcare Providers:  seriousbroker.it  This test is no t yet approved or cleared by the [place]  FDA and  has been authorized for  detection and/or diagnosis of SARS-CoV-2 by FDA under an Emergency Use Authorization (EUA). This EUA will remain  in effect (meaning this test can be used) for the duration of the COVID-19 declaration under Section 564(b)(1) of the Act, 21 U.S.C.section 360bbb-3(b)(1), unless the authorization is terminated  or revoked sooner.       Influenza A by PCR NEGATIVE NEGATIVE Final   Influenza B by PCR NEGATIVE NEGATIVE Final    Comment: (NOTE) The Xpert Xpress SARS-CoV-2/FLU/RSV plus assay is intended as an aid in the diagnosis of influenza from Nasopharyngeal swab specimens and should not be used as a sole basis for treatment. Nasal washings and aspirates are unacceptable for Xpert Xpress SARS-CoV-2/FLU/RSV testing.  Fact Sheet for Patients: bloggercourse.com  Fact Sheet for Healthcare Providers: seriousbroker.it  This test is not yet approved or cleared by the [place]  FDA and has been authorized for detection and/or diagnosis of SARS-CoV-2 by FDA under an Emergency Use Authorization (EUA). This EUA will remain in effect (meaning this test can be used) for the duration of the COVID-19 declaration under Section 564(b)(1) of the Act, 21 U.S.C. section 360bbb-3(b)(1), unless the authorization is terminated or revoked.     Resp Syncytial Virus by PCR NEGATIVE NEGATIVE Final    Comment: (NOTE) Fact Sheet for Patients: bloggercourse.com  Fact Sheet for Healthcare Providers: seriousbroker.it  This test is not yet approved or cleared by the [place]  FDA and has been authorized for detection and/or diagnosis of SARS-CoV-2 by FDA under an Emergency Use Authorization (EUA). This EUA will remain in effect (meaning this test can be used) for the duration of the COVID-19 declaration under Section 564(b)(1) of the Act, 21 U.S.C. section 360bbb-3(b)(1), unless the authorization is  terminated or revoked.  Performed at [facility], [address] [place], [place] [address]   Culture, blood (Routine x 2)     Status: None   Collection Time: 08/14/24 10:13 AM   Specimen: BLOOD  Result Value Ref Range Status   Specimen Description   Final    BLOOD RIGHT ANTECUBITAL Performed  at [facility], [address] [place], [place] [address]    Special Requests   Final    BOTTLES DRAWN AEROBIC AND ANAEROBIC Blood Culture adequate volume Performed at [facility], [address], [place], [place] [address]    Culture   Final    NO GROWTH 5 DAYS Performed at [facility], [address], [place], [place] [address]    Report Status 08/19/2024 FINAL  Final  MRSA Next Gen by PCR, Nasal     Status: None   Collection Time: 08/15/24 12:51 PM   Specimen: Nasal Mucosa  Result Value Ref Range Status   MRSA by PCR Next Gen NOT DETECTED NOT DETECTED Final    Comment: (NOTE) The GeneXpert MRSA Assay (FDA approved for NASAL specimens only), is one component of a comprehensive MRSA colonization surveillance program. It is not intended to diagnose MRSA infection nor to guide or monitor treatment for MRSA infections. Test performance is not FDA approved in patients less than 25 years old. Performed at [facility], [address] [place], [place] [address]   Respiratory (~20 pathogens) panel by PCR     Status: None   Collection Time: 08/15/24 12:52 PM   Specimen: Nasopharyngeal Swab; Respiratory  Result Value Ref Range Status   Adenovirus NOT DETECTED NOT DETECTED Final   Coronavirus 229E NOT DETECTED NOT DETECTED Final    Comment: (NOTE) The Coronavirus on the Respiratory Panel, DOES NOT test for the novel  Coronavirus (2019 nCoV)    Coronavirus HKU1 NOT DETECTED NOT DETECTED Final   Coronavirus NL63 NOT DETECTED NOT DETECTED Final   Coronavirus OC43 NOT DETECTED NOT DETECTED Final    Metapneumovirus NOT DETECTED NOT DETECTED Final   Rhinovirus / Enterovirus NOT DETECTED NOT DETECTED Final   Influenza A NOT DETECTED NOT DETECTED Final   Influenza B NOT DETECTED NOT DETECTED Final   Parainfluenza Virus 1 NOT DETECTED NOT DETECTED Final   Parainfluenza Virus 2 NOT DETECTED NOT DETECTED Final   Parainfluenza Virus 3 NOT DETECTED NOT DETECTED Final   Parainfluenza Virus 4 NOT DETECTED NOT DETECTED Final   Respiratory Syncytial Virus NOT DETECTED NOT DETECTED Final   Bordetella pertussis NOT DETECTED NOT DETECTED Final   Bordetella Parapertussis NOT DETECTED NOT DETECTED Final   Chlamydophila pneumoniae NOT DETECTED NOT DETECTED Final   Mycoplasma pneumoniae NOT DETECTED NOT DETECTED Final    Comment: Performed at [facility], [address] [place], [place] [address]   *Note: Due to a large number of results and/or encounters for the requested time period, some results have not been displayed. A complete set of results can be found in Results Review.   Labs: CBC: Recent Labs  Lab 08/14/24 0936 08/15/24 0514 08/16/24 0625 08/17/24 0624 08/18/24 0541 08/19/24 0550  WBC 11.3* 9.5 6.5 5.2 5.1 5.2  NEUTROABS 10.4*  --  4.9 4.0 3.9 3.8  HGB 12.9* 10.9* 10.4* 10.7* 10.5* 10.6*  HCT 39.0 32.8* 30.6* 32.0* 31.9* 32.1*  MCV 89.4 90.1 87.9 88.4 89.6 88.4  PLT 176 147* 133* 161 172 186   Basic Metabolic Panel: Recent Labs  Lab 08/15/24 0514 08/16/24 0625 08/17/24 0624 08/18/24 0541 08/19/24 0550  NA 139 141 138 139 139  K 3.8 3.8 3.8 3.6 3.6  CL 106 108 106 106 106  CO2 21* 22 18* 21* 21*  GLUCOSE 112* 122* 123* 120* 119*  BUN 17 15 15 14 14   CREATININE 1.39* 1.56*  1.40* 1.46* 1.49*  CALCIUM  8.8* 8.9 8.8* 8.9 9.0  MG 1.8 2.2 2.2 2.2 2.1  PHOS 1.6* 2.7 2.8 2.6 2.6   Liver Function Tests: Recent Labs  Lab 08/15/24 0514 08/16/24 0625 08/17/24 0624 08/18/24 0541 08/19/24 0550  AST 26 16 18 18 18   ALT 6 10 9 10 8   ALKPHOS 115 85 81 83 97  BILITOT  0.4 0.5 0.4 0.3 0.3  PROT 6.5 6.8 6.8 6.7 6.9  ALBUMIN 3.3* 3.3* 3.2* 3.2* 3.4*   CBG: Recent Labs  Lab 08/18/24 1149 08/18/24 1639 08/18/24 2159 08/19/24 0733 08/19/24 1135  GLUCAP 151* 95 105* 123* 146*   Discharge time spent: greater than 30 minutes.  Signed: Alejandro Marker, DO [facility] 08/20/2024 "

## 2024-08-19 NOTE — Progress Notes (Signed)
 Discharge medications delivered to patient at the bedside in a secure bag.
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
## 2024-08-19 NOTE — Progress Notes (Signed)
 Telemetry removed by patient. Plan is for pt to discharge today. Telelmetry box 60 returned to the desk

## 2024-08-19 NOTE — TOC Transition Note (Signed)
 Transition of Care [facility]) - Discharge Note   Patient Details  Name: Richard Navarro MRN: [id number] Date of Birth: 11/16/1945  Transition of Care [facility]) CM/SW Contact:  Heather DELENA Saltness, LCSW Phone Number: 08/19/2024, 1:29 PM   Clinical Narrative:    Pt discharging home today. CSW spoke with pt's significant other, Heron, who reports pt is active for [facility] services with [facility]. CSW spoke with Artavia at [facility] who confirms pt is active with agency. HH orders placed. Pt's significant other will transport pt home upon discharge. No further TOC needs at this time.   Final next level of care: Home w Home Health Services Barriers to Discharge: Barriers Resolved   Patient Goals and CMS Choice Patient states their goals for this hospitalization and ongoing recovery are:: To return home CMS Medicare.gov Compare Post Acute Care list provided to:: Patient Choice offered to / list presented to : Patient [place] ownership interest in [facility].provided to:: Patient    Discharge Placement Home  Patient to be transferred to facility by: Significant other Name of family member notified: Patient Patient and family notified of of transfer: 08/19/24  Discharge Plan and Services Additional resources added to the After Visit Summary for  Follow Up     Post Acute Care Choice: Resumption of Svcs/PTA Provider          DME Arranged: N/A DME Agency: NA       HH Arranged: PT, OT HH Agency: [facility] Date HH Agency Contacted: 08/19/24 Time HH Agency Contacted: 1328 Representative spoke with at [facility] Agency: [facility]  Social Drivers of Health (SDOH) Interventions SDOH Screenings   Food Insecurity: No Food Insecurity (08/14/2024)  Housing: Low Risk (08/14/2024)  Transportation Needs: No Transportation Needs (08/14/2024)  Utilities: Not At Risk (08/14/2024)  Alcohol Screen: Low Risk (01/03/2024)  Depression (PHQ2-9): Low Risk (03/27/2024)  Financial  Resource Strain: Low Risk (01/03/2024)  Physical Activity: Insufficiently Active (01/03/2024)  Social Connections: Socially Isolated (08/14/2024)  Stress: Stress Concern Present (01/03/2024)  Tobacco Use: Medium Risk (08/19/2024)  Health Literacy: Adequate Health Literacy (01/03/2024)     Readmission Risk Interventions    01/22/2023   11:45 AM  Readmission Risk Prevention Plan  Transportation Screening Complete  PCP or Specialist Appt within 5-7 Days Complete  Home Care Screening Complete  Medication Review (RN CM) Complete     Signed: Heather Saltness, MSW, LCSW Clinical Social Worker Inpatient Care Management 08/19/2024 1:33 PM

## 2024-08-20 ENCOUNTER — Telehealth: Payer: Self-pay | Admitting: *Deleted

## 2024-08-20 NOTE — Transitions of Care (Post Inpatient/ED Visit) (Signed)
 "  08/20/2024  Name: Richard Navarro MRN: [id number] DOB: 12/02/1945  Today's TOC FU Call Status: Today's TOC FU Call Status:: Successful TOC FU Call Completed TOC FU Call Complete Date: 08/20/24  Patient's Name and Date of Birth confirmed. Name, DOB  Transition Care Management Follow-up Telephone Call Date of Discharge: 08/19/24 Discharge Facility: [facility]) Type of Discharge: Inpatient Admission Primary Inpatient Discharge Diagnosis:: CAP (community acquired pneumonia) How have you been since you were released from the hospital?:  (Eating, drinking  very well  no issues with bowel/ bladder, uses walker, cane) Any questions or concerns?: No  Items Reviewed: Did you receive and understand the discharge instructions provided?: Yes Medications obtained,verified, and reconciled?: Yes (Medications Reviewed) Any new allergies since your discharge?: No Dietary orders reviewed?: Yes Type of Diet Ordered:: heart healthy Do you have support at home?: Yes People in Home [RPT]: significant other Name of Support/Comfort Primary Source: [place]  Medications Reviewed Today: Medications Reviewed Today     Reviewed by Aura Mliss LABOR, RN (Registered Nurse) on 08/20/24 at 1106  Med List Status: <None>   Medication Order Taking? Sig Documenting Provider Last Dose Status Informant  albuterol  (VENTOLIN  HFA) 108 (90 Base) MCG/ACT inhaler [id number] Yes Inhale 2 puffs into the lungs every 4 (four) hours as needed for wheezing or shortness of breath. Byrum, Robert S, MD  Active Friend  allopurinol  (ZYLOPRIM ) 100 MG tablet [id number] Yes TAKE 1 TABLET DAILY Katrinka Garnette KIDD, MD  Active Friend  amLODipine  (NORVASC ) 5 MG tablet [id number] Yes Take 1 tablet (5 mg total) by mouth daily after supper. Sheikh, Omair Ohlman, DO  Active   apixaban  (ELIQUIS ) 5 MG TABS tablet [id number] Yes Take 1 tablet (5 mg total) by mouth 2 (two) times daily. Katrinka Garnette KIDD, MD  Active Friend  cefdinir (OMNICEF) 300  MG capsule [id number] Yes Take 1 capsule (300 mg total) by mouth 2 (two) times daily for 3 days. Sheikh, Omair Latif, DO  Active   Cholecalciferol  (VITAMIN D3) 25 MCG (1000 UT) CAPS [id number] Yes Take 1,000 Units by mouth daily. [provider]  Active Friend  docusate sodium  (COLACE) 100 MG capsule [id number] Yes Take 100 mg by mouth in the morning. [provider]  Active Friend  doxycycline  (VIBRA -TABS) 100 MG tablet [id number] Yes Take 1 tablet (100 mg total) by mouth every 12 (twelve) hours for 3 days. [address], [place], [place]  Active   DULCOLAX 5 MG EC tablet [id number] Yes Take 5 mg by mouth daily as needed for moderate constipation. [provider]  Active Friend  feeding supplement, GLUCERNA SHAKE, (GLUCERNA SHAKE) LIQD [id number] Yes Take 237 mLs by mouth 2 (two) times daily as needed (when not eating). [provider]  Active Friend  Ferrous Sulfate  (IRON ) 325 (65 Fe) MG TABS [id number] Yes Take 325 mg by mouth See admin instructions. Take 325 mg by mouth every other night [provider]  Active Friend           Med Note MARISA, NATHANEL SAILOR   Mon Jan 29, 2023  9:30 PM)    GAS-X EXTRA STRENGTH 125 MG chewable tablet [id number] Yes Chew 125 mg by mouth every 6 (six) hours as needed for flatulence. [provider]  Active Friend  GAVISCON EXTRA STRENGTH 160-105 MG CHEW [id number] Yes Chew 1 tablet by mouth every 6 (six) hours as needed (for gas). [provider]  Active Friend  glipiZIDE  (GLUCOTROL ) 5 MG tablet [id number] Yes Take 1 tablet (5 mg  total) by mouth daily before breakfast. Katrinka Garnette KIDD, MD  Active Friend  glucose blood test strip [id number] Yes Use to test blood sugar twice a day Katrinka Garnette KIDD, MD  Active Friend  guaiFENesin  (MUCINEX ) 600 MG 12 hr tablet [id number] Yes Take 600 mg by mouth See admin instructions. Take 600 mg by mouth with breakfast and supper [provider]  Active Friend  ketotifen  (ZADITOR ) 0.025 %  ophthalmic solution [id number] Yes Place 1 drop into both eyes 2 (two) times daily as needed (allergies). [provider]  Active Friend  magnesium  hydroxide (MILK OF MAGNESIA) 400 MG/5ML suspension [id number] Yes Take 30 mLs by mouth daily as needed (constipation). [provider]  Active Friend  metoprolol  tartrate (LOPRESSOR ) 25 MG tablet [id number] Yes TAKE 1 TABLET TWICE A DAY Katrinka Garnette KIDD, MD  Active Friend  OXYGEN  [id number] Yes Inhale 2 L/min into the lungs as needed (for shortness of breath). [provider]  Active Friend  pantoprazole  (PROTONIX ) 40 MG tablet [id number] Yes Take 1 tablet (40 mg total) by mouth 2 (two) times daily. Katrinka Garnette KIDD, MD  Active Friend  polyethylene glycol (MIRALAX  / GLYCOLAX ) 17 g packet [id number] Yes Take 17 g by mouth daily. [provider]  Active Friend  Tiotropium Bromide -Olodaterol (STIOLTO RESPIMAT ) 2.5-2.5 MCG/ACT AERS [id number] Yes Inhale 2 puffs into the lungs daily.  Patient taking differently: Inhale 1 puff into the lungs in the morning and at bedtime.   Katrinka Garnette KIDD, MD  Active Friend  TYLENOL  8 HOUR 650 MG CR tablet [id number] Yes Take 650 mg by mouth every 8 (eight) hours as needed for pain. [provider]  Active Friend  vitamin B-12 (CYANOCOBALAMIN ) 1000 MCG tablet [id number] Yes Take 1,000 mcg by mouth daily.  [provider]  Active Friend  vitamin C  (ASCORBIC ACID ) 500 MG tablet [id number] Yes Take 500 mg by mouth daily. [provider]  Active Friend  Med List Note Teretha Renaee SAILOR, RPH-CPP 11/19/19 1249): Temodar  and Xeloda  filled at [facility]            Home Care and Equipment/Supplies: Were Home Health Services Ordered?: Yes Name of Home Health Agency:: [facility] (will be resumption of care, pt was already active with [facility]) Has Agency set up a time to come to your home?: No (pt states  they are supposed to do a visit tomorrow) Any new  equipment or medical supplies ordered?: No  Functional Questionnaire: Do you need assistance with bathing/showering or dressing?: No Do you need assistance with meal preparation?: No Do you need assistance with eating?: No Do you have difficulty maintaining continence: No Do you need assistance with getting out of bed/getting out of a chair/moving?: Yes Do you have difficulty managing or taking your medications?: Yes  Follow up appointments reviewed: PCP Follow-up appointment confirmed?:  (scheduled by care guide) Date of PCP follow-up appointment?: 09/03/24 Follow-up Provider: Dr. Garnette Katrinka  @ 1120 am Specialist Hospital Follow-up appointment confirmed?: Yes Date of Specialist follow-up appointment?: 09/17/24 Follow-Up Specialty Provider:: neurologist and neuropsych testing    pt/ significant other have contact # for Dr. Shelah and will call to schedule Do you need transportation to your follow-up appointment?: No Do you understand care options if your condition(s) worsen?: Yes-patient verbalized understanding  SDOH Interventions Today    Flowsheet Row Most Recent Value  SDOH Interventions   Food Insecurity Interventions Intervention Not Indicated  Housing Interventions Intervention Not Indicated  Transportation Interventions Intervention Not Indicated  Utilities Interventions Intervention Not Indicated    Goals Addressed             This Visit's Progress    VBCI Transitions of Care (TOC) Care Plan       Problems:  Recent Hospitalization for treatment of Pneumonia No Hospital Follow Up Provider appointment pt requests RN CM schedule post hospital follow up and No Specialist appointment pt, caregiver will call to schedule with pulmonary Dr. Shelah Pt has PTSD for many years from service in [place], will be following up with neurologist and neuropsych testing/ consult on 09/17/24, will determine next steps for most appropriate treatment, pt has tried counseling, group therapy  in the past, declines social work, would like to see what neuropsych testing results show [facility] will be working with pt  Goal:  Over the next 30 days, the patient will not experience hospital readmission  Interventions:  Evaluation of current treatment plan related to pneumonia, self-management and patient's adherence to plan as established by provider. Discussed plans with patient for ongoing care management follow up and provided patient with direct contact information for care management team Evaluation of current treatment plan related to pneumonia and patient's adherence to plan as established by provider Reviewed medications with patient and discussed importance of taking as prescribed Collaborated with care guide regarding scheduling post hospital follow up, appointment made for 09/03/24 @ 1120 am Discussed plans with patient for ongoing care management follow up and provided patient with direct contact information for care management team Screening for signs and symptoms of depression related to chronic disease state  Assessed social determinant of health barriers Reviewed signs /symptoms of infection, pneumonia, importance of seeking care early for change in health status / symptoms Reviewed all upcoming scheduled appointments Reviewed safety precautions, importance of using walker/ cane as needed  Patient Self Care Activities:  Attend all scheduled provider appointments Call pharmacy for medication refills 3-7 days in advance of running out of medications Call provider office for new concerns or questions  Notify RN Care Manager of [facility] call rescheduling needs Participate in Transition of Care Program/Attend TOC scheduled calls Take medications as prescribed   Neuropsych testing and consult scheduled for 09/17/24  Plan:  Telephone follow up appointment with care management team member scheduled for:  08/28/24 @ 115 pm The patient has been provided with contact  information for the care management team and has been advised to call with any health related questions or concerns.         Mliss Creed [facility], BSN RN Care Manager/ Transition of Care [place]/ [facility] [phone]  "

## 2024-08-22 ENCOUNTER — Telehealth: Payer: Self-pay | Admitting: Family Medicine

## 2024-08-22 DIAGNOSIS — I2699 Other pulmonary embolism without acute cor pulmonale: Secondary | ICD-10-CM

## 2024-08-22 NOTE — Telephone Encounter (Signed)
 [facility] faxed Home Health Certificate (Order [place] [id number]), to be filled out by provider. [facility] requested to send it back via Fax within ASAP. Document is located in providers tray at front office.Please advise at [phone].

## 2024-08-25 ENCOUNTER — Telehealth: Payer: Self-pay | Admitting: *Deleted

## 2024-08-25 NOTE — Progress Notes (Signed)
 Complex Care Management Note  Care Guide Note 08/25/2024 Name: Richard Navarro MRN: [id number] DOB: 09-Feb-1946  Richard Navarro is a 79 y.o. year old male who sees Katrinka, Garnette KIDD, MD for primary care. I reached out to [employer] by phone today to offer complex care management services.  Mr. Degrace was given information about Complex Care Management services today including:   The Complex Care Management services include support from the care team which includes your Nurse Care Manager, Clinical Social Worker, or Pharmacist.  The Complex Care Management team is here to help remove barriers to the health concerns and goals most important to you. Complex Care Management services are voluntary, and the patient may decline or stop services at any time by request to their care team member.   Complex Care Management Consent Status: Patient agreed to services and verbal consent obtained.   Follow up plan:  Telephone appointment with complex care management team member scheduled for:  09/10/2024  Encounter Outcome:  Patient Scheduled  Thedford Franks, CMA, AAMA [place]  [facility], [facility] Guide, Lead Direct Dial: [phone]  Fax: [phone]

## 2024-08-26 ENCOUNTER — Ambulatory Visit: Admitting: Podiatry

## 2024-08-26 ENCOUNTER — Encounter: Payer: Self-pay | Admitting: Podiatry

## 2024-08-26 DIAGNOSIS — M79675 Pain in left toe(s): Secondary | ICD-10-CM

## 2024-08-26 DIAGNOSIS — E1151 Type 2 diabetes mellitus with diabetic peripheral angiopathy without gangrene: Secondary | ICD-10-CM | POA: Diagnosis not present

## 2024-08-26 DIAGNOSIS — M79674 Pain in right toe(s): Secondary | ICD-10-CM | POA: Diagnosis not present

## 2024-08-26 DIAGNOSIS — B351 Tinea unguium: Secondary | ICD-10-CM | POA: Diagnosis not present

## 2024-08-28 ENCOUNTER — Other Ambulatory Visit: Payer: Self-pay | Admitting: *Deleted

## 2024-08-28 NOTE — Patient Instructions (Signed)
 Visit Information  Thank you for taking time to visit with me today. Please don't hesitate to contact me if I can be of assistance to you before our next scheduled telephone appointment.  Our next appointment is by telephone on 09/04/24 @ 2 pm  Following is a copy of your care plan:   Goals Addressed             This Visit's Progress    VBCI Transitions of Care (TOC) Care Plan       Problems:  Recent Hospitalization for treatment of Pneumonia No Hospital Follow Up Provider appointment pt requests RN CM schedule post hospital follow up and No Specialist appointment pt, caregiver will call to schedule with pulmonary Dr. Shelah Pt has had PTSD for many years from service in [place], will be following up with neurologist and neuropsych testing/ consult on 09/17/24, will determine next steps for most appropriate treatment, pt has tried counseling, group therapy in the past, declines social work, would like to see what neuropsych testing results show [facility] continues working with pt Pt to see pulmonologist Dr. Shelah on 10/02/24, continues using oxygen  at 2 liters via [place] prn Spoke with patient and DPR Heron Idol  Goal:  Over the next 30 days, the patient will not experience hospital readmission  Interventions:  Evaluation of current treatment plan related to pneumonia, self-management and patient's adherence to plan as established by provider. Discussed plans with patient for ongoing care management follow up and provided patient with direct contact information for care management team Evaluation of current treatment plan related to pneumonia and patient's adherence to plan as established by provider Reviewed medications with patient and discussed importance of taking as prescribed Discussed plans with patient for ongoing care management follow up and provided patient with direct contact information for care management team Reinforced signs /symptoms of infection, pneumonia,  importance of seeking care early for change in health status / symptoms Reviewed all upcoming scheduled appointments Reviewed safety precautions, importance of using walker/ cane as needed Reviewed oxygen  safety  Patient Self Care Activities:  Attend all scheduled provider appointments Call pharmacy for medication refills 3-7 days in advance of running out of medications Call provider office for new concerns or questions  Notify RN Care Manager of [facility] call rescheduling needs Participate in Transition of Care Program/Attend TOC scheduled calls Take medications as prescribed   Neuropsych testing and consult scheduled for 09/17/24 Pulmonary Dr. Shelah follow up 10/02/24 Primary care provider 09/03/24  Plan:  Telephone follow up appointment with care management team member scheduled for:  09/04/24 @ 2 pm with Bing Edison RN The patient has been provided with contact information for the care management team and has been advised to call with any health related questions or concerns.         Care plan and visit instructions communicated with the patient verbally today. Patient agrees to receive a copy in MyChart. Active MyChart status and patient understanding of how to access instructions and care plan via MyChart confirmed with patient.     Telephone follow up appointment with care management team member scheduled for: 09/04/24 @ 2 pm  Please call the care guide team at [phone] if you need to cancel or reschedule your appointment.   Please call the Suicide and Crisis Lifeline: 988 call the [place]  National Suicide Prevention Lifeline: 6188474374 or TTY: 510-084-1842 TTY 6181925482) to talk to a trained counselor call 1-800-273-TALK (toll free, 24 hour hotline) go to [facility] [address] [place] [phone]) call 911 if you are experiencing a Mental Health or Behavioral Health Crisis or need someone to talk to.  Mliss Creed [facility],  BSN RN Care Manager/ Transition of Care [place]/ [facility] [phone]
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
## 2024-08-31 ENCOUNTER — Encounter: Payer: Self-pay | Admitting: Podiatry

## 2024-08-31 NOTE — Progress Notes (Signed)
"  °  Subjective:  Patient ID: Richard Navarro, male    DOB: 1945/09/19,  MRN: [id number]  Richard Navarro presents to clinic today for at risk foot care. Pt has h/o NIDDM with PAD and painful thick toenails that are difficult to trim. Pain interferes with ambulation. Aggravating factors include wearing enclosed shoe gear. Pain is relieved with periodic professional debridement.  Chief Complaint  Patient presents with   Diabetes    Toenails  Saw Dr. Garnette Lukes - 07/31/2024; A1C - 6.5   New problem(s): None.   PCP is Lukes Garnette KIDD, MD.  Allergies[1]  Review of Systems: Negative except as noted in the HPI.  Objective: No changes noted in today's physical examination. There were no vitals filed for this visit. Richard Navarro is a pleasant 79 y.o. male thin build in NAD. AAO x 3.  Vascular Examination: Vascular status intact b/l with faintly palpable pedal pulses. CFT immediate b/l. Pedal hair absent. No edema. No pain with calf compression b/l. Skin temperature gradient WNL b/l. No varicosities noted. No cyanosis or clubbing noted.  Neurological Examination: Sensation grossly intact b/l with 10 gram monofilament. Vibratory sensation intact b/l.  Dermatological Examination: Pedal skin  thin and atrophic b/l. No open wounds nor interdigital macerations noted. Toenails 1-5 b/l thick, discolored, elongated with subungual debris and pain on dorsal palpation. No hyperkeratotic lesions noted b/l.   Musculoskeletal Examination: Muscle strength 5/5 to b/l LE.  No pain, crepitus noted b/l. Clawtoe 2-5 b/l. Utilizes walker for ambulation assistance.  Radiographs: None  Assessment/Plan: 1. Pain due to onychomycosis of toenails of both feet   2. Type II diabetes mellitus with peripheral circulatory disorder [facility])     Patient was evaluated and treated. All patient's and/or POA's questions/concerns addressed on today's visit. Mycotic toenails 1-5 b/l debrided in length and girth  without incident.  Continue daily foot inspections and monitor blood glucose per PCP/Endocrinologist's recommendations.Continue soft, supportive shoe gear daily. Report any pedal injuries to medical professional. Call office if there are any quesitons/concerns. -Patient/POA to call should there be question/concern in the interim.   Return in about 3 months (around 11/24/2024).  Richard Navarro, DPM      [place] LOCATION: [address] [place] [address]                   Office [phone]   [place] LOCATION: [address] [place], [place] [address] Office [phone]     [1]  Allergies Allergen Reactions   Afinitor  [Everolimus ] Swelling and Other (See Comments)    Angioedema- Patient was on this and Lisinopril  at the same time; had no trouble breathing, but throat became swollen   Lisinopril  Swelling and Other (See Comments)    Angioedema- on this and afinitor  same time; had no trouble breathing, but throat became swollen   Other Other (See Comments)    Certain seafoods cause gout flares   Rosuvastatin  Other (See Comments)    Chills and muscle aches   Statins Other (See Comments)    Joint pain   Simvastatin Other (See Comments)    Joint pain   "

## 2024-09-03 ENCOUNTER — Ambulatory Visit: Admitting: Family Medicine

## 2024-09-03 ENCOUNTER — Encounter: Payer: Self-pay | Admitting: Family Medicine

## 2024-09-03 VITALS — BP 108/62 | HR 68 | Temp 97.7°F | Ht 75.0 in | Wt 164.4 lb

## 2024-09-03 DIAGNOSIS — C7A8 Other malignant neuroendocrine tumors: Secondary | ICD-10-CM | POA: Diagnosis not present

## 2024-09-03 DIAGNOSIS — I10 Essential (primary) hypertension: Secondary | ICD-10-CM | POA: Diagnosis not present

## 2024-09-03 DIAGNOSIS — J9611 Chronic respiratory failure with hypoxia: Secondary | ICD-10-CM | POA: Diagnosis not present

## 2024-09-03 DIAGNOSIS — C7B8 Other secondary neuroendocrine tumors: Secondary | ICD-10-CM | POA: Diagnosis not present

## 2024-09-03 DIAGNOSIS — Z7984 Long term (current) use of oral hypoglycemic drugs: Secondary | ICD-10-CM | POA: Diagnosis not present

## 2024-09-03 DIAGNOSIS — J449 Chronic obstructive pulmonary disease, unspecified: Secondary | ICD-10-CM | POA: Diagnosis not present

## 2024-09-03 DIAGNOSIS — R790 Abnormal level of blood mineral: Secondary | ICD-10-CM

## 2024-09-03 DIAGNOSIS — E1122 Type 2 diabetes mellitus with diabetic chronic kidney disease: Secondary | ICD-10-CM | POA: Diagnosis not present

## 2024-09-03 DIAGNOSIS — N1832 Chronic kidney disease, stage 3b: Secondary | ICD-10-CM

## 2024-09-03 LAB — CBC WITH DIFFERENTIAL/PLATELET
Basophils Absolute: 0.1 K/uL (ref 0.0–0.1)
Basophils Relative: 0.9 % (ref 0.0–3.0)
Eosinophils Absolute: 0.1 K/uL (ref 0.0–0.7)
Eosinophils Relative: 1.3 % (ref 0.0–5.0)
HCT: 39.6 % (ref 39.0–52.0)
Hemoglobin: 13.1 g/dL (ref 13.0–17.0)
Lymphocytes Relative: 14.2 % (ref 12.0–46.0)
Lymphs Abs: 0.8 K/uL (ref 0.7–4.0)
MCHC: 33 g/dL (ref 30.0–36.0)
MCV: 89.6 fl (ref 78.0–100.0)
Monocytes Absolute: 0.3 K/uL (ref 0.1–1.0)
Monocytes Relative: 4.9 % (ref 3.0–12.0)
Neutro Abs: 4.6 K/uL (ref 1.4–7.7)
Neutrophils Relative %: 78.7 % — ABNORMAL HIGH (ref 43.0–77.0)
Platelets: 301 K/uL (ref 150.0–400.0)
RBC: 4.41 Mil/uL (ref 4.22–5.81)
RDW: 17.2 % — ABNORMAL HIGH (ref 11.5–15.5)
WBC: 5.8 K/uL (ref 4.0–10.5)

## 2024-09-03 LAB — COMPREHENSIVE METABOLIC PANEL WITH GFR
ALT: 10 U/L (ref 3–53)
AST: 14 U/L (ref 5–37)
Albumin: 4 g/dL (ref 3.5–5.2)
Alkaline Phosphatase: 89 U/L (ref 39–117)
BUN: 19 mg/dL (ref 6–23)
CO2: 27 meq/L (ref 19–32)
Calcium: 9.6 mg/dL (ref 8.4–10.5)
Chloride: 101 meq/L (ref 96–112)
Creatinine, Ser: 1.49 mg/dL (ref 0.40–1.50)
GFR: 44.05 mL/min — ABNORMAL LOW
Glucose, Bld: 137 mg/dL — ABNORMAL HIGH (ref 70–99)
Potassium: 4.1 meq/L (ref 3.5–5.1)
Sodium: 137 meq/L (ref 135–145)
Total Bilirubin: 0.3 mg/dL (ref 0.2–1.2)
Total Protein: 8.5 g/dL — ABNORMAL HIGH (ref 6.0–8.3)

## 2024-09-03 LAB — MAGNESIUM: Magnesium: 2.1 mg/dL (ref 1.5–2.5)

## 2024-09-03 LAB — PHOSPHORUS: Phosphorus: 3.1 mg/dL (ref 2.3–4.6)

## 2024-09-03 NOTE — Patient Instructions (Addendum)
 Please stop by lab before you go- if unable to urinate- get urine cup to bring back If you have mychart- we will send your results within 3 business days of us  receiving them.  If you do not have mychart- we will call you about results within 5 business days of us  receiving them.  *please also note that you will see labs on mychart as soon as they post. I will later go in and write notes on them- will say notes from Dr. Katrinka Finder you are feeling better. Holding off on x-ray since you have upcoming CT chest.   Recommended follow up: Return for next already scheduled visit or sooner if needed.

## 2024-09-03 NOTE — Progress Notes (Signed)
 " Phone [phone] In person visit   Subjective:   Richard Navarro is a 79 y.o. 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    Priority: High   Chronic hypoxemic respiratory failure (HCC) 08/04/2021    Priority: High   Anemia 05/07/2018    Priority: High   Chronic kidney disease (CKD) stage G3b/A1, moderately decreased glomerular filtration rate (GFR) between 30-44 mL/min/1.73 square meter and albuminuria creatinine ratio less than 30 mg/g (HCC) 03/04/2018    Priority: High   Neuroendocrine carcinoma (HCC) 08/09/2017    Priority: High   Primary cancer of right lung (HCC) 12/22/2016    Priority: High   Diabetes mellitus with renal manifestation (HCC)     Priority: High   Myalgia due to statin 03/04/2018    Priority: Medium    COPD (chronic obstructive pulmonary disease) (HCC) 12/01/2016    Priority: Medium    PTSD (post-traumatic stress disorder) 12/07/2015    Priority: Medium    Erectile dysfunction 04/09/2014    Priority: Medium    Former smoker 03/26/2014    Priority: Medium    Essential hypertension     Priority: Medium    Gout     Priority: Medium    Depression     Priority: Medium    Hyperlipidemia     Priority: Medium    Encounter for antineoplastic chemotherapy 01/10/2017    Priority: Low   Goals of care, counseling/discussion 01/10/2017    Priority: Low   Prostate cancer screening 04/17/2014    Priority: Low   Arthritis 03/26/2014    Priority: Low   History of adenomatous polyp of colon 03/26/2014    Priority: Low   GERD (gastroesophageal reflux disease)     Priority: Low   Carpal tunnel syndrome 10/26/2020    Priority: 1.   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   CAP (community acquired pneumonia) 08/14/2024   Acute hypoxic respiratory failure (HCC) 08/14/2024   Pleural effusion exudative 01/23/2023   Chronic rhinitis 10/19/2022   Prolonged QT interval 04/17/2021   Sepsis (HCC) 04/16/2021   Alcohol abuse 10/26/2020   Tobacco use disorder 10/26/2020   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) 02/17/2020   Sepsis due to pneumonia (HCC) 01/26/2020   Recurrent major depressive disorder, in full remission 10/21/2019    Medications- reviewed and updated Current Outpatient Medications  Medication Sig Dispense Refill   albuterol  (VENTOLIN  HFA) 108 (90 Base) MCG/ACT inhaler Inhale 2 puffs into the lungs every 4 (four) hours as needed for wheezing or shortness of breath. 3 each 2   allopurinol  (ZYLOPRIM ) 100 MG tablet TAKE 1 TABLET DAILY 90 tablet 3   amLODipine  (NORVASC ) 5 MG tablet Take 1 tablet (5 mg total) by mouth daily after supper. 30 tablet 0   apixaban  (ELIQUIS ) 5 MG TABS tablet Take 1 tablet (5 mg total) by mouth 2 (two) times daily. 180 tablet 3   Cholecalciferol  (VITAMIN D3) 25 MCG (1000 UT) CAPS Take 1,000 Units by mouth daily.     docusate sodium  (COLACE) 100 MG capsule Take 100 mg by mouth in the morning.     DULCOLAX 5 MG EC  tablet Take 5 mg by mouth daily as needed for moderate constipation.     feeding supplement, GLUCERNA SHAKE, (GLUCERNA SHAKE) LIQD Take 237 mLs by mouth 2 (two) times daily as needed (when not eating).     Ferrous Sulfate  (IRON ) 325 (65 Fe) MG TABS Take 325 mg by mouth See admin instructions. Take 325 mg by mouth every other night     GAS-X EXTRA STRENGTH 125 MG chewable tablet Chew 125 mg by mouth every 6 (six) hours as needed for flatulence.     GAVISCON EXTRA STRENGTH 160-105 MG CHEW Chew 1 tablet by mouth every 6 (six) hours as needed (for gas).     glipiZIDE  (GLUCOTROL ) 5 MG tablet Take 1 tablet (5 mg total) by mouth daily before breakfast. 90 tablet 3    glucose blood test strip Use to test blood sugar twice a day 200 each 3   guaiFENesin  (MUCINEX ) 600 MG 12 hr tablet Take 600 mg by mouth See admin instructions. Take 600 mg by mouth with breakfast and supper     ketotifen  (ZADITOR ) 0.025 % ophthalmic solution Place 1 drop into both eyes 2 (two) times daily as needed (allergies).     magnesium  hydroxide (MILK OF MAGNESIA) 400 MG/5ML suspension Take 30 mLs by mouth daily as needed (constipation).     metoprolol  tartrate (LOPRESSOR ) 25 MG tablet TAKE 1 TABLET TWICE A DAY 180 tablet 3   OXYGEN  Inhale 2 L/min into the lungs as needed (for shortness of breath).     pantoprazole  (PROTONIX ) 40 MG tablet Take 1 tablet (40 mg total) by mouth 2 (two) times daily. 180 tablet 3   polyethylene glycol (MIRALAX  / GLYCOLAX ) 17 g packet Take 17 g by mouth daily.     Tiotropium Bromide -Olodaterol (STIOLTO RESPIMAT ) 2.5-2.5 MCG/ACT AERS Inhale 2 puffs into the lungs daily. (Patient taking differently: Inhale 1 puff into the lungs in the morning and at bedtime.) 4 g 11   TYLENOL  8 HOUR 650 MG CR tablet Take 650 mg by mouth every 8 (eight) hours as needed for pain.     vitamin B-12 (CYANOCOBALAMIN ) 1000 MCG tablet Take 1,000 mcg by mouth daily.      vitamin C  (ASCORBIC ACID ) 500 MG tablet Take 500 mg by mouth daily.     No current facility-administered medications for this visit.     Objective:  BP 108/62 (BP Location: Left Arm, Patient Position: Sitting, Cuff Size: Normal)   Pulse 68   Temp 97.7 F (36.5 C) (Temporal)   Ht 6' 3 (1.905 m)   Wt 164 lb 6.4 oz (74.6 kg)   SpO2 94%   BMI 20.55 kg/m  Gen: NAD, resting comfortably CV: RRR no murmurs rubs or gallops Lungs: CTAB no crackles, wheeze, rhonchi Ext: no edema Skin: warm, dry  Diabetic foot exam was performed with the following findings:   No deformities, ulcerations, or other skin breakdown Normal sensation of 10g monofilament Intact posterior tibialis and dorsalis pedis pulses Pulses are faint         Assessment and Plan   # Community-acquired pneumonia S: Patient was hospitalized from 08/14/2024 to 08/19/2024 for community-acquired pneumonia.  Presented with shortness of breath, cough and temperature up to 101.5 with progressive weakness and reduced oral intake and difficulty ambulating as well as brief episode of chest pain.  CT scan concerning for left upper lobe pneumonia with right upper lobe changes consistent with chronic cancer.  Ultimately admitted for sepsis due to community-acquired pneumonia-also had acute on  chronic respiratory failure with hypoxia.  He was treated with antibiotics cefepime  and vancomycin  later narrowed to ceftriaxone  and doxycycline  and gentle IV fluids and he had gradual improvement.  Psychiatry was consulted due to PTSD symptoms.  His oxygen  was weaned and he was cleared by psychiatry.  Blood culture showed no growth at 5 days.  -They recommended outpatient PT and OT through home health -Incentive spirometer and flutter valve use -Chest x-ray showed medial right lung nodular opacity with increasing streaky opacity peripheral to lesion may represent postobstructive change with improving right pleural effusion   White blood count trended from 9.5k-5.1k for discharge.  Since being home breathing has improved, no further fever. He reports illness came on quickly.   Appetite improving/eating better   A/P: pneumonia symptom(s) came on quickly but fortunately he has been improving after treatment. PTSD symptom(s) flared but he reports feeling better compared to where he was.  - working with home health physical therapy-hasn't seen occupational therapy yet -  hospital team recommended CXR at about 6 weeksout but he's already scheduled in about a month for full CT chest abdomen and pelvis so we will defer   As far as chronic respiratory failure-he is back to oxygen  on as-needed basis only after treatment in the hospital  # Neuroendocrine carcinoma/lung  cancer-noted again on imaging but treatment has been on hold since March 2024 and he remains under observation-has upcoming CT imaging in February and as above we opted to hold off on repeating chest x-ray as CT will give more comprehensive picture. Continue to monitor and continue oncology follow up.  -known metastases to liver will be observed with upoming CT scan  # Memory loss-working with [facility].  MRI brain 07/2024 largely reassuring.  MoCA 22 out of 30 on 07/08/2024.   # Pulmonary embolism S: Noted 07/16/2024 emergency department and hospitalized. He remains on eliquis  5 mg twice daily A/P: With ongoing cancer he has a predisposition for DVT/PE-would be my preference to likely discontinue the Eliquis  but also open to hematology opinion  #COPD- on stiolto and sees Dr. Shelah -overall stable-did not seem to be overly flared by pneumonia thankfully.  Has upcoming pulmonary follow-up-continue current medication and keep follow-up  #Chronic kidney disease stage IIIb S: Patient with significant worsening in kidney function dating back to November 2020-we have actually written a letter to the [facility] explaining how cancer treatment affected kidney function.  Patient follows with [facility] -GFR typically in the 40s with creatinine of 1.4-1.7 range A/P: Numbers pretty stable in this range during hospitalization-we will update today as per request from hospital  #Diabetes mellitus S: medication(s): glipizide  5 mg daily, restart metformin  500 mg daily August 2025 Lab Results  Component Value Date   HGBA1C 6.5 (A) 07/31/2024   HGBA1C 7.9 (H) 03/27/2024   HGBA1C 6.6 (A) 09/27/2023   A/P: Diabetes has been well-controlled-continue current medication-does not report any lows but I would have a very low threshold to consider stopping glipizide  again  #hyperlipidemia in light of peripheral arterial disease on ankle-brachial index November 2025 S: Medication: none A/P: With ongoing cancer  we have opted to hold off on treatment even with known diabetes diagnosis  #Hypertension  S: Compliant with amlodipine  10 mg and metoprolol  25 mg twice daily BP Readings from Last 3 Encounters:  09/03/24 108/62  08/19/24 (!) 151/79  07/31/24 112/68  A/P: Well-controlled today-continue current medication-does not report lightheadedness/dizziness  Recommended follow up: Return for next already scheduled visit or sooner if  needed. Future Appointments  Date Time Provider Department Center  09/04/2024  2:00 PM Carolee Heron NOVAK, RN [facility] None  09/10/2024  1:00 PM Merlynn Lyle CROME, LCSW [facility] None  09/17/2024  1:00 PM Kdeiss, Bianca [facility] None  09/17/2024  2:00 PM [facility] NEUROPSYCH TECH2 [facility] None  09/24/2024  2:30 PM Kdeiss, Bianca [facility] None  10/02/2024  4:00 PM Shelah Lamar RAMAN, MD [facility] [address]  10/07/2024 10:45 AM [facility] None  10/07/2024 11:30 AM [facility] 2 [facility] [place]  10/08/2024  2:30 PM Dina Camie BRAVO, PA-C [facility] None  10/14/2024 11:00 AM Sherrod Sherrod, MD [facility] None  11/12/2024  1:45 PM Byrum, Lamar RAMAN, MD [facility] [address]  12/02/2024  2:45 PM Gaynel Delon CROME, DPM [facility]  12/09/2024  2:00 PM Katrinka Garnette KIDD, MD [facility]  01/12/2025 10:40 AM [facility] ANNUAL WELLNESS VISIT 1 [facility] [place]    Lab/Order associations:   ICD-10-CM   1. Essential hypertension  I10 Comprehensive metabolic panel with GFR    CBC with Differential/Platelet    Magnesium     2. Low serum phosphorus for age  R34.0 Phosphorus    3. Type 2 diabetes mellitus with stage 3b chronic kidney disease, without long-term current use of insulin  (HCC)  E11.22 Microalbumin / creatinine urine ratio   N18.32 Microalbumin / creatinine urine ratio    4. 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 Chronic hypoxemic respiratory failure (HCC)  J96.11     6. Chronic obstructive pulmonary disease,  unspecified COPD type (HCC)  J44.9       No orders of the defined types were placed in this encounter.   Return precautions advised.  Garnette Katrinka, MD  "

## 2024-09-04 ENCOUNTER — Ambulatory Visit: Payer: Self-pay | Admitting: Family Medicine

## 2024-09-04 ENCOUNTER — Other Ambulatory Visit

## 2024-09-04 ENCOUNTER — Other Ambulatory Visit: Payer: Self-pay

## 2024-09-04 LAB — MICROALBUMIN / CREATININE URINE RATIO
Creatinine,U: 108.3 mg/dL
Microalb Creat Ratio: 26.8 mg/g (ref 0.0–30.0)
Microalb, Ur: 2.9 mg/dL — ABNORMAL HIGH (ref 0.7–1.9)
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
## 2024-09-10 ENCOUNTER — Other Ambulatory Visit: Payer: Self-pay | Admitting: Licensed Clinical Social Worker

## 2024-09-10 NOTE — Patient Instructions (Signed)
 Visit Information  Thank you for taking time to visit with me today. Please don't hesitate to contact me if I can be of assistance to you before our next scheduled appointment.  Our next appointment is by telephone on 09/25/24 at 2 Please call the care guide team at [phone] if you need to cancel or reschedule your appointment.   Following is a copy of your care plan:   Goals Addressed             This Visit's Progress    LCSW VBCI Social Work Care Plan       Problems:             Mental Health needs related to PTSD, depression, stress and behavioral/memory changes. Served in [place] War [phone] and was injured           Mental Health Concerns -Memory loss- neurologist appointment on 09/17/24           Needs Support, Education, and Care Coordination in order to meet unmet mental health needs. Clinical Goal(s): demonstrate a reduction in symptoms related to : PTSD, Depression, connect with provider for ongoing mental health treatment.  , and increase coping skills, healthy habits, self-management skills, and stress reduction      Clinical Interventions:    Assessed patient's previous and current treatment, coping skills, support system and barriers to care.  ?         Depression screen reviewed  ?         Solution-Focused Strategies ?         Mindfulness or Relaxation Training ?         Active listening / Reflection utilized  ?         Emotional Supportive Provided ?         Behavioral Activation ?         Participation in counseling/VA men support group encouraged  ?         Verbalization of feelings encouraged  ?         Crisis Resource Education / information provided  ?         Suicidal Ideation/Homicidal Ideation assessed: No SI/HI  ?         Discussed referral for counseling           Reviewed various resources and discussed options for treatment   ?         Options for mental health treatment based on need and insurance           Inter-disciplinary care team  collaboration (see longitudinal plan of care)  Patient was emailed bh resources: [facility]: This practice was founded by retired Psychiatric nurse and provides targeted therapy for PTSD, substance abuse, and military marriage challenges. [facility]: [employer], cost-free counseling for veterans and active-duty members in a non-medical setting. Their team includes staff experienced in treating the psychological effects of combat and military sexual trauma (MST). [facility]: Includes staff with scientist, research (medical) experience and specialized services for veterans navigating anxiety and life transitions- Email sent with this information on 09/10/24 to patient's spouse (primary caregiver)   Patient Goals/Self-Care Activities: Over the next 120 days Attend scheduled medical appointments Utilize healthy coping skills and supportive resources discussed Contact PCP with any questions or concerns Keep 90 percent of counseling appointments Call your insurance provider for more information about your Enhanced Benefits  Check out counseling resources provided by email Begin personal counseling with  LCSW, to reduce and manage symptoms of PTSD, Depression and Stress, until well-established with mental health provider Incorporate into daily practice - relaxation techniques, deep breathing exercises, and mindfulness meditation strategies. Talk about feelings with friends, family members, spiritual advisor, etc. Contact LCSW directly [phone]), if you have questions, need assistance, or if additional social work needs are identified between now and our next scheduled telephone outreach call. Call 988 for mental health hotline/crisis line if needed (24/7 available) Try techniques to reduce symptoms of anxiety/negative thinking (deep breathing, distraction, positive self talk, etc)  - develop a personal safety plan - develop a plan to deal with triggers like  holidays, anniversaries - exercise at least 2 to 3 times per week - have a plan for how to handle bad days - journal feelings and what helps to feel better or worse - spend time or talk with others at least 2 to 3 times per week - watch for early signs of feeling worse - begin personal counseling - call and visit an old friend - check out volunteer opportunities - join a support group - laugh; watch a funny movie or comedian - learn and use visualization or guided imagery - perform a random act of kindness - practice relaxation or meditation daily - start or continue a personal journal - practice positive thinking and self-talk -continue with compliance of taking medication  -identify current effective and ineffective coping strategies.  -implement positive self-talk in care to increase self-esteem, confidence and feelings of control.  -consider alternative and complementary therapy approaches such as meditation, mindfulness or yoga.  -journaling, prayer, worship services, meditation or pastoral counseling.  -increase participation in pleasurable group activities such as hobbies, singing, sports or volunteering).  -consider the use of meditative movement therapy such as tai chi, yoga or qigong.  -start a regular daily exercise program based on tolerance, ability and patient choice to support positive thinking and activity      Patient Goals/Self-Care Activities:  Increase coping skills, healthy habits, self-management skills, and stress reduction  Plan:   The care management team will reach out to the patient again over the next 30 days.        Please call the Suicide and Crisis Lifeline: 988 call the [place]  National Suicide Prevention Lifeline: 9712747314 or TTY: (506)436-6715 TTY 548-558-8317) to talk to a trained counselor call 1-800-273-TALK (toll free, 24 hour hotline) go to [facility] [address] [place] [phone]) call  911 if you are experiencing a Mental Health or Behavioral Health Crisis or need someone to talk to.  Patient verbalized understanding of Care plan and visit instructions communicated this visit  Lyle Rung, BSW, MSW, LCSW Licensed Clinical Social Worker [facility]   [email or web address] Direct Dial: [phone]
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
## 2024-09-11 ENCOUNTER — Telehealth: Payer: Self-pay

## 2024-09-17 ENCOUNTER — Ambulatory Visit

## 2024-09-17 ENCOUNTER — Institutional Professional Consult (permissible substitution): Payer: Self-pay | Admitting: Psychology

## 2024-09-22 ENCOUNTER — Institutional Professional Consult (permissible substitution): Admitting: Psychology

## 2024-09-22 ENCOUNTER — Other Ambulatory Visit: Payer: Self-pay

## 2024-09-22 ENCOUNTER — Ambulatory Visit
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
## 2024-09-24 ENCOUNTER — Encounter: Payer: Self-pay | Admitting: Psychology

## 2024-09-25 ENCOUNTER — Telehealth: Admitting: Licensed Clinical Social Worker

## 2024-09-25 ENCOUNTER — Telehealth: Payer: Self-pay

## 2024-09-25 NOTE — Progress Notes (Unsigned)
 Complex Care Management Care Guide Note  09/25/2024 Name: JADIER ROCKERS MRN: [id number] DOB: 1945/08/23  [name] is a 79 y.o. year old male who is a primary care patient of Katrinka Garnette KIDD, MD and is actively engaged with the care management team. I reached out to [name] by phone today to assist with re-scheduling  with the Licensed Clinical Child Psychotherapist.  Follow up plan: Unsuccessful telephone outreach attempt made. A HIPAA compliant phone message was left for the patient providing contact information and requesting a return call.  Debbe Fuse [facility], [facility] Guide  Direct Dial: [phone]  Fax [phone]

## 2024-09-29 ENCOUNTER — Encounter: Admitting: Psychology

## 2024-10-02 ENCOUNTER — Ambulatory Visit: Admitting: Emergency Medicine

## 2024-10-07 ENCOUNTER — Ambulatory Visit (HOSPITAL_COMMUNITY)

## 2024-10-07 ENCOUNTER — Inpatient Hospital Stay

## 2024-10-08 ENCOUNTER — Ambulatory Visit: Payer: Self-pay | Admitting: Physician Assistant

## 2024-10-14 ENCOUNTER — Inpatient Hospital Stay: Admitting: Internal Medicine

## 2024-11-12 ENCOUNTER — Ambulatory Visit: Admitting: Emergency Medicine

## 2024-12-02 ENCOUNTER — Ambulatory Visit: Admitting: Podiatry

## 2024-12-05 ENCOUNTER — Ambulatory Visit: Admitting: Family Medicine

## 2024-12-09 ENCOUNTER — Ambulatory Visit: Admitting: Family Medicine

## 2025-01-12 ENCOUNTER — Ambulatory Visit
